# Patient Record
Sex: Male | Born: 1945
Health system: Southern US, Community
[De-identification: ages and names within clinical notes are randomized; demographics above are authoritative.]

## PROBLEM LIST (undated history)

## (undated) DIAGNOSIS — E785 Hyperlipidemia, unspecified: Secondary | ICD-10-CM

## (undated) DIAGNOSIS — R011 Cardiac murmur, unspecified: Secondary | ICD-10-CM

## (undated) DIAGNOSIS — M109 Gout, unspecified: Secondary | ICD-10-CM

## (undated) DIAGNOSIS — I10 Essential (primary) hypertension: Secondary | ICD-10-CM

## (undated) DIAGNOSIS — M1990 Unspecified osteoarthritis, unspecified site: Secondary | ICD-10-CM

## (undated) DIAGNOSIS — C801 Malignant (primary) neoplasm, unspecified: Secondary | ICD-10-CM

## (undated) DIAGNOSIS — C169 Malignant neoplasm of stomach, unspecified: Secondary | ICD-10-CM

## (undated) HISTORY — DX: Hyperlipidemia, unspecified: E78.5

## (undated) HISTORY — PX: FINGER SURGERY: SHX640

## (undated) HISTORY — DX: Malignant neoplasm of stomach, unspecified: C16.9

## (undated) HISTORY — PX: COLONOSCOPY: SHX174

## (undated) HISTORY — DX: Gout, unspecified: M10.9

## (undated) NOTE — *Deleted (*Deleted)
[facility]   Telephone:(336) [phone] Fax:(336) [phone]   Clinic Follow up Note   Patient Care Team: Gabriel Coop, FNP as PCP - General (Family Medicine) Gabriel Mood, MD as Consulting Physician (Hematology) Gabriel Samples, NP as Nurse Practitioner (Nurse Practitioner) Gabriel Lint, MD as Consulting Physician (General Surgery) Gabriel Hippo, MD as Consulting Physician (Gastroenterology) Gabriel Fee, MD as Attending Physician (Gastroenterology) Gabriel Poole, RD as Dietitian (Nutrition)  Date of Service:  03/15/2020  CHIEF COMPLAINT: F/u on gastric cancer and IDA  SUMMARY OF ONCOLOGIC HISTORY: Oncology History Overview Note  Cancer Staging 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 Microscopic Comment Several of the fragments are involved by moderately differentiated adenocarcinoma.   02/17/2017 Procedure   UPPER ENDOSCOPY  FINDINGS - Normal esophagus. - Z-line irregular, 44 cm from the incisors. - Red blood in the gastric body and in the gastric antrum. - Large gastric ulcer. Biopsied. - Erythematous mucosa in the antrum. - Normal cardia, gastric fundus, gastric body and pylorus. - Normal duodenal bulb and second portion of the duodenum. Comment: Endoscopic appearence concerning for malignant ulcer.   03/11/2017 Procedure   UPPER EUS PER DR. Christella Poole Findings: 1. The esophagus was normal. 2. There was a 2-3cm ulcerated, malignant mass along the greater curvature of the stomach, approximately  mid-body. The mass was non-circumferential. 3. The duodenum was normal. Endosonographic Finding 1. The gastric mass above correlated with a hypoechoic and heterogenous non-circumferential mass that measured 2.9cm across, 7mm deep. The endosonographic borders were poorly defined and there was clear sonographic evidence suggesting invasion into and through the muscularis propria layer without evident invasion into nearby organs (uT3). 2. The duodenal lymphnode described on recent CT scan appeared reactive by [place] criteria. 3. No perigastric adenopathy (uN0) 4. Limited views of the liver, spleen, pancreas, bile duct, gallbladder were all normal. - Along the greater curvature of the stomach, approximately mid-body, there is a 2.9cm uT3N0 (clinical stage IIB) gastric adenocarcinoma.   04/06/2017 Imaging   CT CHEST IMPRESSION: 1. No acute cardiopulmonary abnormalities. 2. Two small pulmonary nodules are noted measuring up to 5 mm. Nonspecific but warrant attention on follow-up imaging follow up 3. Nonspecific hyperdense, possibly enhancing lesion along the dome of liver is identified. In a patient that is at increased risk for more definitive assessment of this structure with contrast enhanced MRI of the liver is advised.   04/21/2017 Imaging   MR ABDOMEN IMPRESSION: 1. Enhancing 2.9 cm mass along the lesser curvature in the gastric antrum, compatible with known gastric malignancy . 2. Mildly enlarged gastrohepatic ligament lymph node, cannot exclude nodal metastasis. 3. No definite liver metastatic disease. Hyperenhancing 0.9 cm liver dome mass remains inconclusive, although the MRI features are most suggestive of a flash filling hemangioma, and the mass has been stable for nearly 2 months. Additional subcentimeter focus of hyperenhancement in the lateral segment left liver lobe is most likely a benign transient vascular phenomenon. Recommend attention to these lesions on a follow-up MRI  abdomen without and with IV contrast in  3-6 months. This recommendation follows ACR consensus guidelines: Management of Incidental Liver Lesions on CT: A White Paper of the ACR Incidental Findings Committee. J Am Coll Radiol 2017; 27:0350-0938. 4. Benign right adrenal adenoma.     04/22/2017 Pathology Results   Diagnosis 1. Liver, biopsy - BILE DUCT HAMARTOMA. - THERE IS NO EVIDENCE OF MALIGNANCY. 2. Lymph nodes, regional resection, portal - METASTATIC CARCINOMA IN 2 OF 6 LYMPH NODES (2/6). 3. Stomach, resection for tumor, distal - INVASIVE ADENOCARCINOMA, POORLY DIFFERENTIATED, SPANNING 3.8 CM. - PERINEURAL INVASION IS IDENTIFIED. - ADENOCARCINOMA INVOLVES THE SEROSA. - METASTATIC CARCINOMA IN 12 OF 30 LYMPH NODES (12/30), WITH EXTRACAPSULAR EXTENSION. - SEE ONCOLOGY TABLE BELOW. 4. Lymph node, biopsy, common hepatic artery - THERE IS NO EVIDENCE OF CARCINOMA IN 1 OF 1 LYMPH NODE (0/1). Microscopic Comment 3. STOMACH: Specimen: Stomach. Procedure: Partial gastrectomy. Tumor Site: Greater curvature. Tumor Size: 3.8 cm Histologic Type: Adenocarcinoma. Histologic Grade: G3: poorly differentiated. Microscopic Extent of Tumor: Adenocarcinoma involves the serosa. Margins (select all that apply): Adenoca Proximal Margin: Negative for adenocarcinoma. Distal Margin: Negative for adenocarcinoma. Treatment Effect: N/A Lymph-Vascular Invasion: Not identified. Perineural Invasion: Present. Additional findings: Chronic gastritis. Ancillary testing: Can be performed upon clinician request. 1 of 3 Duplicate copy FINAL for Gabriel Poole ([id number]) Microscopic Comment(continued) Lymph nodes: number examined - 37; number positive: 14 Pathologic Staging: pT4a, pN3a (JBK:gt, 04/27/17)   06/02/2017 - 09/30/2017 Adjuvant Chemotherapy   FOLFOX every 2 weeks, oxaliplatin stopped in March 2019 due to neuropathy, chemo stopped on 09/30/2017 per pt's request    10/29/2017 Imaging    10/29/2017 CT CAP  IMPRESSION: 1. Status post distal gastrectomy. No evidence for metastatic disease in the chest, abdomen, or pelvis. 2. Stable tiny right pulmonary nodules. Continued attention on follow-up recommended. 3. 9 mm hypervascular lesion in the dome of the liver is stable over multiple studies back to 04/05/2017. Continued attention on follow-up suggested. 4. Ascending thoracic aorta measures 4.2 cm diameter. Recommend annual imaging followup by CTA or MRA. This recommendation follows 2010 ACCF/AHA/AATS/ACR/ASA/SCA/SCAI/SIR/STS/SVM Guidelines for the Diagnosis and Management of Patients with Thoracic Aortic Disease. Circulation. 2010; 121: R678-L381 . 5. Marked prostatomegaly. 6.  Aortic Atherosclerois (ICD10-170.0)   05/09/2018 Imaging   05/09/2018 CT CAP IMPRESSION: 1. No definite findings to suggest metastatic disease to the chest, abdomen or pelvis. 2. Multiple small pulmonary nodules scattered throughout the lungs bilaterally measuring 5 mm or less in size, stable compared to prior studies from 2018, favored to be benign. Continued attention on future follow-up examinations is recommended. 3. Aortic atherosclerosis with ectasia of the ascending thoracic aorta (4 cm in diameter). Recommend annual imaging followup by CTA or MRA. This recommendation follows 2010 ACCF/AHA/AATS/ACR/ASA/SCA/SCAI/SIR/STS/SVM Guidelines for the Diagnosis and Management of Patients with Thoracic Aortic Disease. Circulation. 2010; 121: O175-Z025. 4. Multiple small calculi lying dependently in the right-side of the urinary bladder. 5. Severe prostatomegaly. 6. Additional incidental findings, as above.   04/04/2019 Imaging   CT AP IMPRESSION: 1. Redemonstrated postoperative findings of distal gastrectomy and gastrojejunostomy (series 2, image 18, series 4, image 27). No evidence of malignant recurrence or metastatic disease in the abdomen or pelvis. 2. Subcutaneous body fat is  substantially diminished in comparison to prior examination, in keeping with reported history of weight loss. 3. Gross prostatomegaly and urinary bladder wall thickening, likely due to chronic outlet obstruction. 4.  Aortic Atherosclerosis (ICD10-I70.0).   08/16/2019 Imaging   CT CAP w contrast IMPRESSION: 1. No acute findings within the chest, abdomen or pelvis.  No specific findings identified to suggest residual or recurrent tumor or metastatic disease. 2. Small nonspecific pulmonary nodules are unchanged. 3. Marked enlargement of the prostate gland. 4. Multiple tiny bladder stones. 5. Aortic atherosclerosis. Aortic Atherosclerosis (ICD10-I70.0).   11/02/2019 Procedure   EGD impression - Normal hypopharynx. - Normal esophagus. - Z-line regular, 45 cm from the incisors. - Patent Billroth II gastrojejunostomy was found. 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 Biopsied. - Gastric ulcer with raised margins appears to be separate lesion. Biopsied.  comment: recurrent tumor at anastamosis appears to be separate form fundal lesion. these lesions are the source of chronic blood loss.  Colonoscopy impression: - Perianal skin tags found on perianal exam. - The entire examined colon is normal. - External and internal hemorrhoids. - No specimens collected.   11/02/2019 Pathology Results   FINAL MICROSCOPIC DIAGNOSIS:  A. STOMACH, BIOPSY:  -  Poorly differentiated carcinoma  -  See comment  B. STOMACH, FUNDUS, BIOPSY:  -  Adenocarcinoma  -  See comment  COMMENT:  A and B.  The carcinoma in part A is more poorly differentiated than  what is seen in part B but both are favored to be adenocarcinoma.  Dr.  Rayetta Pigg reviewed the case and agrees with the above diagnosis. Dr.  Karilyn Cota was notified of these results on November 03, 2019.    12/05/2019 PET scan   IMPRESSION: Marked hypermetabolic activity within the thickened stomach in this patient with known gastric cancer with associated  metastatic disease to the liver and small lymph nodes in the area of the hepatic gastric recess that are suspicious based on location and size though not particularly FDG avid.   Small lymph nodes along the posterolateral margin of the aorta with mild increased FDG uptake (image 107) (SUVmax = 2.8 these are approximately 6-7 mm and there are several small nodes tracking beneath the LEFT retrocrural region. Attention on follow-up)   Increased FDG uptake in the central portion of the prostate likely within the prosthetic urethra given the marked hypermetabolic features in the central location. Would however consider correlation with PSA as clinically warranted. Prostate is markedly enlarged otherwise with heterogeneous FDG uptake.   Aortic Atherosclerosis (ICD10-I70.0).    12/07/2019 -  Chemotherapy   FOLFOX every 2 weeks starting 12/07/19. Add Transtuzumab (Kanjinti) q2 weeks and Keytruda q6weeks with C3 on 01/24/20.    12/14/2019 Pathology Results   FINAL MICROSCOPIC DIAGNOSIS:   A. LIVER, NEEDLE CORE BIOPSY:  - Poorly differentiated adenocarcinoma, see comment.   COMMENT:   The tumor has a similar appearance to the patient's recent gastric  biopsies ([id number]). There is sufficient material for additional  testing if requested.       CURRENT THERAPY:  FOLFOX every 2 weeks starting 12/07/19. Add Transtuzumab(Kanjinti) q2 weeks and Keytruda q6weeks with C3 on 01/24/20.  INTERVAL HISTORY: *** SYDNEY AZURE is here for a follow up. He presents to the clinic alone.    REVIEW OF SYSTEMS:  *** Constitutional: Denies fevers, chills or abnormal weight loss Eyes: Denies blurriness of vision Ears, nose, mouth, throat, and face: Denies mucositis or sore throat Respiratory: Denies cough, dyspnea or wheezes Cardiovascular: Denies palpitation, chest discomfort or lower extremity swelling Gastrointestinal:  Denies nausea, heartburn or change in bowel habits Skin: Denies abnormal  skin rashes Lymphatics: Denies new lymphadenopathy or easy bruising Neurological:Denies numbness, tingling or new weaknesses Behavioral/Psych: Poole is stable, no new changes  All other systems were reviewed with the patient and are negative.  MEDICAL HISTORY:  Past Medical History:  Diagnosis Date  . Arthritis   . Cancer [facility]) dx oct 02-2017   stomach  . Gout   . Heart murmur   . Hyperlipidemia   . Hypertension   . Stomach cancer [facility])     SURGICAL HISTORY: Past Surgical History:  Procedure Laterality Date  . BIOPSY  11/02/2019   Procedure: BIOPSY;  Surgeon: Gabriel Hippo, MD;  [facility];  Service: Endoscopy;;  gastric  . COLONOSCOPY    . COLONOSCOPY N/A 11/02/2019   Procedure: COLONOSCOPY;  Surgeon: Gabriel Hippo, MD;  [facility]: [facility];  Service: Endoscopy;  Laterality: N/A;  100  . ESOPHAGOGASTRODUODENOSCOPY N/A 02/17/2017   Procedure: ESOPHAGOGASTRODUODENOSCOPY (EGD);  Surgeon: Gabriel Hippo, MD;  [facility];  Service: Endoscopy;  Laterality: N/A;  3:00  . ESOPHAGOGASTRODUODENOSCOPY N/A 11/02/2019   Procedure: ESOPHAGOGASTRODUODENOSCOPY (EGD);  Surgeon: Gabriel Hippo, MD;  [facility];  Service: Endoscopy;  Laterality: N/A;  . EUS N/A 03/11/2017   Procedure: UPPER ENDOSCOPIC ULTRASOUND (EUS) LINEAR;  Surgeon: Gabriel Fee, MD;  Location: [facility];  Service: Endoscopy;  Laterality: N/A;  . EUS N/A 03/11/2017   Procedure: UPPER ENDOSCOPIC ULTRASOUND (EUS) RADIAL;  Surgeon: Gabriel Fee, MD;  Location: [facility];  Service: Endoscopy;  Laterality: N/A;  . FINGER SURGERY     Lt middle   . GASTRECTOMY N/A 04/22/2017   Procedure: PARTIAL GASTRECTOMY;  Surgeon: Gabriel Lint, MD;  Location: [facility] OR;  Service: General;  Laterality: N/A;  . GASTROJEJUNOSTOMY N/A 04/22/2017   Procedure: JEJUNAL FEEDING TUBE PLACEMENT;  Surgeon: Gabriel Lint, MD;  Location: [facility] OR;  Service: General;  Laterality: N/A;  . HYDROCELE  EXCISION  02/12/2011   Procedure: HYDROCELECTOMY ADULT;  Surgeon: Ky Barban;  Location: [facility] ORS;  Service: Urology;  Laterality: Left;  . LAPAROSCOPY N/A 04/22/2017   Procedure: LAPAROSCOPY DIAGNOSTIC ERAS PATHWAY;  Surgeon: Gabriel Lint, MD;  Location: [facility] OR;  Service: General;  Laterality: N/A;  EPIDURAL  . PORTACATH PLACEMENT N/A 05/28/2017   Procedure: INSERTION PORT-A-CATH ERAS PATHWAY;  Surgeon: Gabriel Lint, MD;  Location: [facility];  Service: General;  Laterality: N/A;    I have reviewed the social history and family history with the patient and they are unchanged from previous note.  ALLERGIES:  is allergic to penicillins.  MEDICATIONS:  Current Outpatient Medications  Medication Sig Dispense Refill  . amLODipine (NORVASC) 10 MG tablet Take 1 tablet (10 mg total) by mouth daily. 90 tablet 0  . atorvastatin (LIPITOR) 40 MG tablet Take 1 tablet (40 mg total) by mouth daily at 6 PM. 90 tablet 0  . Cholecalciferol (VITAMIN D3) 5000 units CAPS Take 5,000 Units by mouth daily.    . finasteride (PROSCAR) 5 MG tablet Take 5 mg by mouth daily.    Marland Kitchen HYDROcodone-acetaminophen (NORCO/VICODIN) 5-325 MG tablet TAKE 1 TABLET EVERY 6 HOURS AS NEEDED FOR MODERATE PAIN 60 tablet 0  . HYDROcodone-acetaminophen (NORCO/VICODIN) 5-325 MG tablet Take 1 tablet by mouth every 6 (six) hours as needed for moderate pain. 60 tablet 0  . lidocaine-prilocaine (EMLA) cream Apply to affected area once 30 g 3  . megestrol (MEGACE ES) 625 MG/5ML suspension Take 5 mLs (625 mg total) by mouth daily. 150 mL 0  . ondansetron (ZOFRAN) 8 MG tablet Take 1 tablet (8 mg total) by mouth 2 (two) times daily as needed for refractory nausea / vomiting. Start on day 3 after  chemotherapy. 30 tablet 1  . pantoprazole (PROTONIX) 40 MG tablet Take 1 tablet (40 mg total) by mouth daily. 30 tablet 2  . potassium chloride SA (K-DUR,KLOR-CON) 20 MEQ tablet Take 1 tablet (20 mEq total) by mouth 2 (two) times daily.  40 tablet 1  . prochlorperazine (COMPAZINE) 10 MG tablet Take 1 tablet (10 mg total) by mouth every 6 (six) hours as needed (Nausea or vomiting). 30 tablet 1  . tadalafil (CIALIS) 5 MG tablet Take 5 mg by mouth daily.    . tamsulosin (FLOMAX) 0.4 MG CAPS capsule Take 0.4 mg by mouth daily.    Marland Kitchen triamterene-hydrochlorothiazide (MAXZIDE-25) 37.5-25 MG tablet Take 1 tablet by mouth daily.     No current facility-administered medications for this visit.    PHYSICAL EXAMINATION: ECOG PERFORMANCE STATUS: {CHL ONC ECOG [phone]}  There were no vitals filed for this visit. There were no vitals filed for this visit. *** GENERAL:alert, no distress and comfortable SKIN: skin color, texture, turgor are normal, no rashes or significant lesions EYES: normal, Conjunctiva are pink and non-injected, sclera clear {OROPHARYNX:no exudate, no erythema and lips, buccal mucosa, and tongue normal}  NECK: supple, thyroid normal size, non-tender, without nodularity LYMPH:  no palpable lymphadenopathy in the cervical, axillary {or inguinal} LUNGS: clear to auscultation and percussion with normal breathing effort HEART: regular rate & rhythm and no murmurs and no lower extremity edema ABDOMEN:abdomen soft, non-tender and normal bowel sounds Musculoskeletal:no cyanosis of digits and no clubbing  NEURO: alert & oriented x 3 with fluent speech, no focal motor/sensory deficits  LABORATORY DATA:  I have reviewed the data as listed CBC Latest Ref Rng & Units 03/06/2020 02/21/2020 02/07/2020  WBC 4.0 - 10.5 K/uL 5.4 10.9(H) 5.7  Hemoglobin 13.0 - 17.0 g/dL 8.3(L) 9.7(L) 9.8(L)  Hematocrit 39 - 52 % 26.4(L) 30.5(L) 30.7(L)  Platelets 150 - 400 K/uL 317 242 252     CMP Latest Ref Rng & Units 03/06/2020 02/21/2020 02/07/2020  Glucose 70 - 99 mg/dL 82 79 78  BUN 8 - 23 mg/dL 14 72(Z) 17  Creatinine 0.61 - 1.24 mg/dL 3.66 4.40 3.47  Sodium 135 - 145 mmol/L 137 138 142  Potassium 3.5 - 5.1 mmol/L 3.8 3.7 3.1(L)   Chloride 98 - 111 mmol/L 107 108 113(H)  CO2 22 - 32 mmol/L 24 28 26   Calcium 8.9 - 10.3 mg/dL 9.2 4.2(V) 9.5(G)  Total Protein 6.5 - 8.1 g/dL 5.9(L) 5.2(L) 5.5(L)  Total Bilirubin 0.3 - 1.2 mg/dL 0.4 0.5 0.4  Alkaline Phos 38 - 126 U/L 82 80 73  AST 15 - 41 U/L 13(L) 12(L) 14(L)  ALT 0 - 44 U/L 8 6 10       RADIOGRAPHIC STUDIES: I have personally reviewed the radiological images as listed and agreed with the findings in the report. CT CHEST ABDOMEN PELVIS W CONTRAST  Result Date: 03/14/2020 CLINICAL DATA:  Restaging gastric cancer. Weight loss and loss of appetite. EXAM: CT CHEST, ABDOMEN, AND PELVIS WITH CONTRAST TECHNIQUE: Multidetector CT imaging of the chest, abdomen and pelvis was performed following the standard protocol during bolus administration of intravenous contrast. CONTRAST:  OMNIPAQUE IOHEXOL 300 MG/ML  SOLN COMPARISON:  PET-CT 12/05/2019 FINDINGS: CT CHEST FINDINGS Cardiovascular: Left Port-A-Cath tip: SVC. Aortic and branch vessel atherosclerotic vascular calcifications. Ascending thoracic aortic aneurysm 4.2 cm in diameter, stable. Mediastinum/Nodes: Unremarkable Lungs/Pleura: Bilateral airway thickening with airway plugging in both lower lobes and resulting airspace opacities due to atelectasis and possibly pneumonia in the lower lobes. Musculoskeletal:  A few small sclerotic right rib lesions are unchanged and not previously hypermetabolic, and considered benign. CT ABDOMEN PELVIS FINDINGS Hepatobiliary: 1.0 cm hypodense lesion in segment 4 of the liver corresponding to hypermetabolic lesion shown on prior PET-CT, but subjectively reduced in size compared to prior PET-CT. Stable 0.8 by 0.5 cm hypodense lesion in the lateral segment left hepatic lobe on image 57/2, no change from 04/04/2019. Borderline wall thickening in the gallbladder. Pancreas: Unremarkable Spleen: Unremarkable Adrenals/Urinary Tract: Both adrenal glands appear normal. Bilateral renal cysts are present.  Nondistended urinary bladder. Stomach/Bowel: Partial gastrectomy. There is previously very high activity in the gastric wall. There are some regions of indistinctness and thickening of the gastric wall and residual tumor is not excluded for example on images 60-62 of series 2. There are dilated loops of small bowel anteriorly measuring up to 4.2 cm in diameter, without a well-defined lead point for obstruction. Cannot exclude wall thickening in the nondistended ascending colon and hepatic flexure. Vascular/Lymphatic: Aortoiliac atherosclerotic vascular disease. 1 no well-defined adenopathy. Reproductive: Marked prostatomegaly. Other: Diffuse low-grade subcutaneous and mesenteric edema. Trace ascites in the pelvis. Musculoskeletal: Unremarkable IMPRESSION: 1. The small hypodense lesion in segment 4 of the liver was previously hypermetabolic, and is subjectively reduced in size compared to prior PET-CT. 2. Dilated loops of small bowel anteriorly without a well-defined lead point for obstruction. This could be from ileus or low-grade partial small bowel obstruction. 3. Continued gastric wall thickening is not entirely specific but residual gastric tumor is not excluded. Postoperative findings along the residual stomach. 4. Cannot exclude wall thickening in the nondistended ascending colon and hepatic flexure. 5. Diffuse low-grade subcutaneous and mesenteric edema. Trace ascites in the pelvis. Appearance favors third spacing of fluid. 6. Bilateral airway thickening with airway plugging in both lower lobes and resulting airspace opacities due to atelectasis and possibly pneumonia in the lower lobes. 7. Marked prostatomegaly. 8. Stable 4.2 cm ascending thoracic aortic aneurysm. 9. Aortic atherosclerosis. Aortic Atherosclerosis (ICD10-I70.0). Electronically Signed   By: Gaylyn Rong M.D.   On: 03/14/2020 17:55     ASSESSMENT & PLAN:  Gabriel Poole is a 55 y.o. male with    1.Primary adenocarcinoma of the  pyloric antrum, pT4a, PN3aM0, stage IIIB, Grade3, MSI-stable, recurrence at the anastomosis and newadenocarcinomain the gastric fundus in 10/2019, oligo liver metastasis 11/2019, HER2(+), MSS -Initially diagnosed in 02/2017.S/psurgical resection and adjuvant chemotherapyFOLFOX. Patient was not compliant with chemo, oxaliplatin stopped after 4 cycles, and 5-FU stopped after 8 cycles treatment per his request. -He unfortunately developed local gastric recurrence in July 2021 and liver metastasis in 12/2019 -His tumor is negative for PD-l1 and EBV, MMR normal, HER2(+), so he is a candidate for trastuzumab.Based on the [id number] data and recent FDA approval, he would also benefit from [facility]  -He started first linechemo FOLFOX q2weeks on 12/07/19.I addedTrastuzumab (Kanjinti)q2weeks and Keytruda q6weekswith C3 on 01/24/20.Goal of care is to control his disease and prolong his life. -S/p C5 he has mild neuropathy in his hands with decreased sensation.  -Labs reviewed and adequate to proceed with C6 FOLFOX and Kanjinti today with oxaliplatin dose reduction -Continue Keytruda every 6 weeks. Proceed today (03/06/20)  -Plan to repeat scan in 2 weeks before next visit. F/u in 2 weeks    2.Abdominal pain, nausea and vomiting and weight loss  -Secondary to metastatic gastric cancer, and chemotherapy -His recent abdominal pain had resolved from C4-5 but recently recurred (8/10). He is taking hydrocodone. -For any constipation he can use miralax.  -  I reviewed antiemetic use with him. Continue Zofran once in the morning and as needed,along with Compazine through the day. Overall he needs antiemetics at least 3 times a day to control his nausea.  -I encouraged him to increase his carnation breakfast to twice daily and continue Megace and Protonix once daily.  -Continue to f/u with Dietician   3. Recurrent iron deficiency anemia, secondary to cancer recurrence -He initially had low serum iron and  low transferrin saturation in 06/2017 after cancer resection and again with cancer recurrence due to iron deficiency.  -He has required blood transfusion on 08/03/19 and 10/25/19. He has received IV Feraheme as needed since 08/08/19. Frequency slowed down on chemotherapy.  -Has been mild and stable but today reduced to 8.3 (03/06/20). He denies obvious bleeding. If worsens, may give blood transfusion  4. Ascendingaortic aneurysm. Will monitor onfuture scans  5. Smokingcessation -Hesmokes less now, 1 cigarette a day occasionally.  6. BPH and elevated PSA -He has nocturia and urinary frequency, history of BPH, and elevated PSA in July 2019 -He was previously seen by urologist before and had prostate shaved.  -PriorCT scan findings showed severe prostatomegaly. PET from 12/05/19 showed marked hypermetabolic uptake in this area. I will repeat PSA, will hold on biopsy for now due to his metastatic gastric cancer.   7. Neuropathy, G1  -Secondary to initial FOLFOX -S/p C5 of restarted FOLFOX he has progressed neuropathy with numbness in hands. No symptoms involved in his feet. I reduced Oxaliplatin dose further with C6.   PLAN:  -I refilled Norco, Zofran, Protonix today  -Labs reviewed and adequate to proceed with C6 FOLFOX and Kanjinti and Keytruda today with oxaliplatin dose reduction -we reviewed antiemetics and symptom management  -Lab, flush, f/u and FOLFOX and Kanjinti in 2 and 4 weeks.  -Continue Keytruda every 6 weeks.   -restaging CT CAP w contrast in 2 weeks before next visit   No problem-specific Assessment & Plan notes found for this encounter.   No orders of the defined types were placed in this encounter.  All questions were answered. The patient knows to call the clinic with any problems, questions or concerns. No barriers to learning was detected. The total time spent in the appointment was {CHL ONC TIME VISIT - GNFAO:[id number]}.     Delphina Cahill 03/15/2020    Rogelia Rohrer, am acting as scribe for Gabriel Mood, MD.   {Add scribe attestation statement}

## (undated) NOTE — *Deleted (*Deleted)
[facility]   Telephone:(336) [phone] Fax:(336) [phone]   Clinic Follow up Note   Patient Care Team: Jason Coop, FNP as PCP - General (Family Medicine) Malachy Mood, MD as Consulting Physician (Hematology) Pollyann Samples, NP as Nurse Practitioner (Nurse Practitioner) Almond Lint, MD as Consulting Physician (General Surgery) Malissa Hippo, MD as Consulting Physician (Gastroenterology) Rachael Fee, MD as Attending Physician (Gastroenterology) Anabel Bene, RD as Dietitian (Nutrition)  Date of Service:  02/23/2020  CHIEF COMPLAINT: F/u on gastric cancer and IDA  SUMMARY OF ONCOLOGIC HISTORY: Oncology History Overview Note  Cancer Staging 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 Microscopic Comment Several of the fragments are involved by moderately differentiated adenocarcinoma.   02/17/2017 Procedure   UPPER ENDOSCOPY  FINDINGS - Normal esophagus. - Z-line irregular, 44 cm from the incisors. - Red blood in the gastric body and in the gastric antrum. - Large gastric ulcer. Biopsied. - Erythematous mucosa in the antrum. - Normal cardia, gastric fundus, gastric body and pylorus. - Normal duodenal bulb and second portion of the duodenum. Comment: Endoscopic appearence concerning for malignant ulcer.   03/11/2017 Procedure   UPPER EUS PER DR. Christella Hartigan Findings: 1. The esophagus was normal. 2. There was a 2-3cm ulcerated, malignant mass along the greater curvature of the stomach, approximately  mid-body. The mass was non-circumferential. 3. The duodenum was normal. Endosonographic Finding 1. The gastric mass above correlated with a hypoechoic and heterogenous non-circumferential mass that measured 2.9cm across, 7mm deep. The endosonographic borders were poorly defined and there was clear sonographic evidence suggesting invasion into and through the muscularis propria layer without evident invasion into nearby organs (uT3). 2. The duodenal lymphnode described on recent CT scan appeared reactive by [place] criteria. 3. No perigastric adenopathy (uN0) 4. Limited views of the liver, spleen, pancreas, bile duct, gallbladder were all normal. - Along the greater curvature of the stomach, approximately mid-body, there is a 2.9cm uT3N0 (clinical stage IIB) gastric adenocarcinoma.   04/06/2017 Imaging   CT CHEST IMPRESSION: 1. No acute cardiopulmonary abnormalities. 2. Two small pulmonary nodules are noted measuring up to 5 mm. Nonspecific but warrant attention on follow-up imaging follow up 3. Nonspecific hyperdense, possibly enhancing lesion along the dome of liver is identified. In a patient that is at increased risk for more definitive assessment of this structure with contrast enhanced MRI of the liver is advised.   04/21/2017 Imaging   MR ABDOMEN IMPRESSION: 1. Enhancing 2.9 cm mass along the lesser curvature in the gastric antrum, compatible with known gastric malignancy . 2. Mildly enlarged gastrohepatic ligament lymph node, cannot exclude nodal metastasis. 3. No definite liver metastatic disease. Hyperenhancing 0.9 cm liver dome mass remains inconclusive, although the MRI features are most suggestive of a flash filling hemangioma, and the mass has been stable for nearly 2 months. Additional subcentimeter focus of hyperenhancement in the lateral segment left liver lobe is most likely a benign transient vascular phenomenon. Recommend attention to these lesions on a follow-up MRI  abdomen without and with IV contrast in  3-6 months. This recommendation follows ACR consensus guidelines: Management of Incidental Liver Lesions on CT: A White Paper of the ACR Incidental Findings Committee. J Am Coll Radiol 2017; 27:0350-0938. 4. Benign right adrenal adenoma.     04/22/2017 Pathology Results   Diagnosis 1. Liver, biopsy - BILE DUCT HAMARTOMA. - THERE IS NO EVIDENCE OF MALIGNANCY. 2. Lymph nodes, regional resection, portal - METASTATIC CARCINOMA IN 2 OF 6 LYMPH NODES (2/6). 3. Stomach, resection for tumor, distal - INVASIVE ADENOCARCINOMA, POORLY DIFFERENTIATED, SPANNING 3.8 CM. - PERINEURAL INVASION IS IDENTIFIED. - ADENOCARCINOMA INVOLVES THE SEROSA. - METASTATIC CARCINOMA IN 12 OF 30 LYMPH NODES (12/30), WITH EXTRACAPSULAR EXTENSION. - SEE ONCOLOGY TABLE BELOW. 4. Lymph node, biopsy, common hepatic artery - THERE IS NO EVIDENCE OF CARCINOMA IN 1 OF 1 LYMPH NODE (0/1). Microscopic Comment 3. STOMACH: Specimen: Stomach. Procedure: Partial gastrectomy. Tumor Site: Greater curvature. Tumor Size: 3.8 cm Histologic Type: Adenocarcinoma. Histologic Grade: G3: poorly differentiated. Microscopic Extent of Tumor: Adenocarcinoma involves the serosa. Margins (select all that apply): Adenoca Proximal Margin: Negative for adenocarcinoma. Distal Margin: Negative for adenocarcinoma. Treatment Effect: N/A Lymph-Vascular Invasion: Not identified. Perineural Invasion: Present. Additional findings: Chronic gastritis. Ancillary testing: Can be performed upon clinician request. 1 of 3 Duplicate copy FINAL for Gabriel Poole, Gabriel Poole ([id number]) Microscopic Comment(continued) Lymph nodes: number examined - 37; number positive: 14 Pathologic Staging: pT4a, pN3a (JBK:gt, 04/27/17)   06/02/2017 - 09/30/2017 Adjuvant Chemotherapy   FOLFOX every 2 weeks, oxaliplatin stopped in March 2019 due to neuropathy, chemo stopped on 09/30/2017 per pt's request    10/29/2017 Imaging    10/29/2017 CT CAP  IMPRESSION: 1. Status post distal gastrectomy. No evidence for metastatic disease in the chest, abdomen, or pelvis. 2. Stable tiny right pulmonary nodules. Continued attention on follow-up recommended. 3. 9 mm hypervascular lesion in the dome of the liver is stable over multiple studies back to 04/05/2017. Continued attention on follow-up suggested. 4. Ascending thoracic aorta measures 4.2 cm diameter. Recommend annual imaging followup by CTA or MRA. This recommendation follows 2010 ACCF/AHA/AATS/ACR/ASA/SCA/SCAI/SIR/STS/SVM Guidelines for the Diagnosis and Management of Patients with Thoracic Aortic Disease. Circulation. 2010; 121: R678-L381 . 5. Marked prostatomegaly. 6.  Aortic Atherosclerois (ICD10-170.0)   05/09/2018 Imaging   05/09/2018 CT CAP IMPRESSION: 1. No definite findings to suggest metastatic disease to the chest, abdomen or pelvis. 2. Multiple small pulmonary nodules scattered throughout the lungs bilaterally measuring 5 mm or less in size, stable compared to prior studies from 2018, favored to be benign. Continued attention on future follow-up examinations is recommended. 3. Aortic atherosclerosis with ectasia of the ascending thoracic aorta (4 cm in diameter). Recommend annual imaging followup by CTA or MRA. This recommendation follows 2010 ACCF/AHA/AATS/ACR/ASA/SCA/SCAI/SIR/STS/SVM Guidelines for the Diagnosis and Management of Patients with Thoracic Aortic Disease. Circulation. 2010; 121: O175-Z025. 4. Multiple small calculi lying dependently in the right-side of the urinary bladder. 5. Severe prostatomegaly. 6. Additional incidental findings, as above.   04/04/2019 Imaging   CT AP IMPRESSION: 1. Redemonstrated postoperative findings of distal gastrectomy and gastrojejunostomy (series 2, image 18, series 4, image 27). No evidence of malignant recurrence or metastatic disease in the abdomen or pelvis. 2. Subcutaneous body fat is  substantially diminished in comparison to prior examination, in keeping with reported history of weight loss. 3. Gross prostatomegaly and urinary bladder wall thickening, likely due to chronic outlet obstruction. 4.  Aortic Atherosclerosis (ICD10-I70.0).   08/16/2019 Imaging   CT CAP w contrast IMPRESSION: 1. No acute findings within the chest, abdomen or pelvis.  No specific findings identified to suggest residual or recurrent tumor or metastatic disease. 2. Small nonspecific pulmonary nodules are unchanged. 3. Marked enlargement of the prostate gland. 4. Multiple tiny bladder stones. 5. Aortic atherosclerosis. Aortic Atherosclerosis (ICD10-I70.0).   11/02/2019 Procedure   EGD impression - Normal hypopharynx. - Normal esophagus. - Z-line regular, 45 cm from the incisors. - Patent Billroth II gastrojejunostomy was found. 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 Biopsied. - Gastric ulcer with raised margins appears to be separate lesion. Biopsied.  comment: recurrent tumor at anastamosis appears to be separate form fundal lesion. these lesions are the source of chronic blood loss.  Colonoscopy impression: - Perianal skin tags found on perianal exam. - The entire examined colon is normal. - External and internal hemorrhoids. - No specimens collected.   11/02/2019 Pathology Results   FINAL MICROSCOPIC DIAGNOSIS:  A. STOMACH, BIOPSY:  -  Poorly differentiated carcinoma  -  See comment  B. STOMACH, FUNDUS, BIOPSY:  -  Adenocarcinoma  -  See comment  COMMENT:  A and B.  The carcinoma in part A is more poorly differentiated than  what is seen in part B but both are favored to be adenocarcinoma.  Dr.  Rayetta Pigg reviewed the case and agrees with the above diagnosis. Dr.  Karilyn Cota was notified of these results on November 03, 2019.    12/05/2019 PET scan   IMPRESSION: Marked hypermetabolic activity within the thickened stomach in this patient with known gastric cancer with associated  metastatic disease to the liver and small lymph nodes in the area of the hepatic gastric recess that are suspicious based on location and size though not particularly FDG avid.   Small lymph nodes along the posterolateral margin of the aorta with mild increased FDG uptake (image 107) (SUVmax = 2.8 these are approximately 6-7 mm and there are several small nodes tracking beneath the LEFT retrocrural region. Attention on follow-up)   Increased FDG uptake in the central portion of the prostate likely within the prosthetic urethra given the marked hypermetabolic features in the central location. Would however consider correlation with PSA as clinically warranted. Prostate is markedly enlarged otherwise with heterogeneous FDG uptake.   Aortic Atherosclerosis (ICD10-I70.0).    12/07/2019 -  Chemotherapy   FOLFOX every 2 weeks starting 12/07/19. Add Transtuzumab (Kanjinti) q2 weeks and Keytruda q6weeks with C3 on 01/24/20.    12/14/2019 Pathology Results   FINAL MICROSCOPIC DIAGNOSIS:   A. LIVER, NEEDLE CORE BIOPSY:  - Poorly differentiated adenocarcinoma, see comment.   COMMENT:   The tumor has a similar appearance to the patient's recent gastric  biopsies ([id number]). There is sufficient material for additional  testing if requested.       CURRENT THERAPY:  FOLFOX every 2 weeks starting 12/07/19. Add Transtuzumab(Kanjinti) q2 weeks and Keytruda q6weeks with C3 on 01/24/20.  INTERVAL HISTORY: *** Gabriel Poole is here for a follow up. He presents to the clinic alone.    REVIEW OF SYSTEMS:  *** Constitutional: Denies fevers, chills or abnormal weight loss Eyes: Denies blurriness of vision Ears, nose, mouth, throat, and face: Denies mucositis or sore throat Respiratory: Denies cough, dyspnea or wheezes Cardiovascular: Denies palpitation, chest discomfort or lower extremity swelling Gastrointestinal:  Denies nausea, heartburn or change in bowel habits Skin: Denies abnormal  skin rashes Lymphatics: Denies new lymphadenopathy or easy bruising Neurological:Denies numbness, tingling or new weaknesses Behavioral/Psych: Mood is stable, no new changes  All other systems were reviewed with the patient and are negative.  MEDICAL HISTORY:  Past Medical History:  Diagnosis Date  . Arthritis   . Cancer [facility]) dx oct 02-2017   stomach  . Gout   . Heart murmur   . Hyperlipidemia   . Hypertension   . Stomach cancer [facility])     SURGICAL HISTORY: Past Surgical History:  Procedure Laterality Date  . BIOPSY  11/02/2019   Procedure: BIOPSY;  Surgeon: Malissa Hippo, MD;  [facility];  Service: Endoscopy;;  gastric  . COLONOSCOPY    . COLONOSCOPY N/A 11/02/2019   Procedure: COLONOSCOPY;  Surgeon: Malissa Hippo, MD;  [facility];  Service: Endoscopy;  Laterality: N/A;  100  . ESOPHAGOGASTRODUODENOSCOPY N/A 02/17/2017   Procedure: ESOPHAGOGASTRODUODENOSCOPY (EGD);  Surgeon: Malissa Hippo, MD;  [facility];  Service: Endoscopy;  Laterality: N/A;  3:00  . ESOPHAGOGASTRODUODENOSCOPY N/A 11/02/2019   Procedure: ESOPHAGOGASTRODUODENOSCOPY (EGD);  Surgeon: Malissa Hippo, MD;  [facility];  Service: Endoscopy;  Laterality: N/A;  . EUS N/A 03/11/2017   Procedure: UPPER ENDOSCOPIC ULTRASOUND (EUS) LINEAR;  Surgeon: Rachael Fee, MD;  Location: [facility];  Service: Endoscopy;  Laterality: N/A;  . EUS N/A 03/11/2017   Procedure: UPPER ENDOSCOPIC ULTRASOUND (EUS) RADIAL;  Surgeon: Rachael Fee, MD;  Location: [facility];  Service: Endoscopy;  Laterality: N/A;  . FINGER SURGERY     Lt middle   . GASTRECTOMY N/A 04/22/2017   Procedure: PARTIAL GASTRECTOMY;  Surgeon: Almond Lint, MD;  Location: [facility] OR;  Service: General;  Laterality: N/A;  . GASTROJEJUNOSTOMY N/A 04/22/2017   Procedure: JEJUNAL FEEDING TUBE PLACEMENT;  Surgeon: Almond Lint, MD;  Location: [facility] OR;  Service: General;  Laterality: N/A;  . HYDROCELE  EXCISION  02/12/2011   Procedure: HYDROCELECTOMY ADULT;  Surgeon: Ky Barban;  Location: [facility];  Service: Urology;  Laterality: Left;  . LAPAROSCOPY N/A 04/22/2017   Procedure: LAPAROSCOPY DIAGNOSTIC ERAS PATHWAY;  Surgeon: Almond Lint, MD;  Location: [facility] OR;  Service: General;  Laterality: N/A;  EPIDURAL  . PORTACATH PLACEMENT N/A 05/28/2017   Procedure: INSERTION PORT-A-CATH ERAS PATHWAY;  Surgeon: Almond Lint, MD;  Location: [facility];  Service: General;  Laterality: N/A;    I have reviewed the social history and family history with the patient and they are unchanged from previous note.  ALLERGIES:  is allergic to penicillins.  MEDICATIONS:  Current Outpatient Medications  Medication Sig Dispense Refill  . amLODipine (NORVASC) 10 MG tablet Take 1 tablet (10 mg total) by mouth daily. 90 tablet 0  . atorvastatin (LIPITOR) 40 MG tablet Take 1 tablet (40 mg total) by mouth daily at 6 PM. 90 tablet 0  . Cholecalciferol (VITAMIN D3) 5000 units CAPS Take 5,000 Units by mouth daily.    . finasteride (PROSCAR) 5 MG tablet Take 5 mg by mouth daily.    Marland Kitchen HYDROcodone-acetaminophen (NORCO/VICODIN) 5-325 MG tablet Take 1 tablet by mouth every 6 (six) hours as needed for moderate pain. 60 tablet 0  . lidocaine-prilocaine (EMLA) cream Apply to affected area once 30 g 3  . megestrol (MEGACE ES) 625 MG/5ML suspension Take 5 mLs (625 mg total) by mouth daily. 150 mL 0  . ondansetron (ZOFRAN) 8 MG tablet Take 1 tablet (8 mg total) by mouth 2 (two) times daily as needed for refractory nausea / vomiting. Start on day 3 after chemotherapy. 30 tablet 1  . pantoprazole (PROTONIX) 40 MG tablet Take 1 tablet (40 mg total) by mouth daily. 30  tablet 2  . potassium chloride SA (K-DUR,KLOR-CON) 20 MEQ tablet Take 1 tablet (20 mEq total) by mouth 2 (two) times daily. 40 tablet 1  . prochlorperazine (COMPAZINE) 10 MG tablet Take 1 tablet (10 mg total) by mouth every 6 (six) hours as needed  (Nausea or vomiting). 30 tablet 1  . tadalafil (CIALIS) 5 MG tablet Take 5 mg by mouth daily.    . tamsulosin (FLOMAX) 0.4 MG CAPS capsule Take 0.4 mg by mouth daily.    Marland Kitchen triamterene-hydrochlorothiazide (MAXZIDE-25) 37.5-25 MG tablet Take 1 tablet by mouth daily.     No current facility-administered medications for this visit.   Facility-Administered Medications Ordered in Other Visits  Medication Dose Route Frequency Provider Last Rate Last Admin  . sodium chloride flush (NS) 0.9 % injection 10 mL  10 mL Intracatheter PRN Malachy Mood, MD   10 mL at 02/23/20 1337    PHYSICAL EXAMINATION: ECOG PERFORMANCE STATUS: {CHL ONC ECOG PS:[id number]}  There were no vitals filed for this visit. There were no vitals filed for this visit. *** GENERAL:alert, no distress and comfortable SKIN: skin color, texture, turgor are normal, no rashes or significant lesions EYES: normal, Conjunctiva are pink and non-injected, sclera clear {OROPHARYNX:no exudate, no erythema and lips, buccal mucosa, and tongue normal}  NECK: supple, thyroid normal size, non-tender, without nodularity LYMPH:  no palpable lymphadenopathy in the cervical, axillary {or inguinal} LUNGS: clear to auscultation and percussion with normal breathing effort HEART: regular rate & rhythm and no murmurs and no lower extremity edema ABDOMEN:abdomen soft, non-tender and normal bowel sounds Musculoskeletal:no cyanosis of digits and no clubbing  NEURO: alert & oriented x 3 with fluent speech, no focal motor/sensory deficits  LABORATORY DATA:  I have reviewed the data as listed CBC Latest Ref Rng & Units 02/21/2020 02/07/2020 01/24/2020  WBC 4.0 - 10.5 K/uL 10.9(H) 5.7 8.4  Hemoglobin 13.0 - 17.0 g/dL 1.6(X) 0.9(U) 10.4(L)  Hematocrit 39 - 52 % 30.5(L) 30.7(L) 32.7(L)  Platelets 150 - 400 K/uL 242 252 244     CMP Latest Ref Rng & Units 02/21/2020 02/07/2020 01/24/2020  Glucose 70 - 99 mg/dL 79 78 79  BUN 8 - 23 mg/dL 04(V) 17 16   Creatinine 0.61 - 1.24 mg/dL 4.09 8.11 9.14  Sodium 135 - 145 mmol/L 138 142 140  Potassium 3.5 - 5.1 mmol/L 3.7 3.1(L) 3.5  Chloride 98 - 111 mmol/L 108 113(H) 109  CO2 22 - 32 mmol/L 28 26 26   Calcium 8.9 - 10.3 mg/dL 7.8(G) 9.5(A) 9.0  Total Protein 6.5 - 8.1 g/dL 5.2(L) 5.5(L) 6.0(L)  Total Bilirubin 0.3 - 1.2 mg/dL 0.5 0.4 0.5  Alkaline Phos 38 - 126 U/L 80 73 88  AST 15 - 41 U/L 12(L) 14(L) 14(L)  ALT 0 - 44 U/L 6 10 7       RADIOGRAPHIC STUDIES: I have personally reviewed the radiological images as listed and agreed with the findings in the report. No results found.   ASSESSMENT & PLAN:  Gabriel Poole is a 47 y.o. male with    1.Primary adenocarcinoma of the pyloric antrum, pT4a, PN3aM0, stage IIIB, Grade3, MSI-stable, recurrence at the anastomosis and newadenocarcinomain the gastric fundus in 10/2019, oligo liver metastasis 11/2019, HER2(+), MSS -Initially diagnosed in 02/2017.S/psurgical resection and adjuvant chemotherapyFOLFOX. Patient was not compliant with chemo, oxaliplatin stopped after 4 cycles, and 5-FU stopped after 8 cycles treatment per his request. -He unfortunately developed local gastric recurrence in July 2021 and liver metastasis in 12/2019 -His tumor  is negative for PD-l1 and EBV, MMR normal, HER2(+), so he is a candidate for trastuzumab. -He started first linechemo FOLFOX q2weeks on 12/07/19.I addedTrastuzumab (Kanjinti)q2weeks and Keytruda q6weekswith C3 on 01/24/20.Goal of care is to control his disease and prolong his life. -S/p C5 ***  -Continue Keytruda every 6 weeks.  -Plan to repeat scan end of 02/2020 -F/u next week for toxicity check with IV Fluids.    2.Abdominal pain, nausea and vomiting and weight loss  -Secondary to metastatic gastric cancer, and chemotherapy -His recent abdominal pain has currently resolved between C4. He still has hydrocodone if needed.  -For any constipation he can use miralax.  -Continue Zofran once  in the morning and as needed,along with Compazine through the day. Overall he needs antiemetics at least 3 times a day to control his nausea.  -I encouraged him to increase his carnation breakfast to twice daily and continue Megace and Protonix once daily.  -Continue to f/u with Dietician   3. 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  -He has required blood transfusion on 08/03/19 and 10/25/19. He has received IV Feraheme as needed since 08/08/19. Frequency slowed down on chemotherapy.  -Currently mild and stable anemia on chemo.   4. Ascendingaortic aneurysm. Will monitor onfuture scans  5. Smokingcessation -Hesmokes less now, 1 cigarette a day occasionally.  6. BPH and elevated PSA -He has nocturia and urinary frequency, history of BPH, and elevated PSA in July 2019 -He was previously seen by urologist before and had prostate shaved.  -PriorCT scan findings showed severe prostatomegaly. PET from 12/05/19 showed marked hypermetabolic uptake in this area. I will repeat PSA, will hold on biopsy for now due to his metastatic gastric cancer.   7. 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 This is currently manageable with adequate functioning.  -No progressive neuropathyso far on FOLFOX, but does have increased cold sensitivity. Will continue to monitor.    PLAN:  -Labs reviewed and adequate to proceed with C6 FOLFOX and Kanjinti and Keytruda with dose reduced Oxaliplatin to 60 mg/m due to poor tolerance -Lab, flush, f/u and IV Fluids next week for toxicity checkup -Lab, flush, f/u and FOLFOX and Kanjinti in 2 and 3 weeks  -Restaging CT scan after cycle 6     No problem-specific Assessment & Plan notes found  for this encounter.   No orders of the defined types were placed in this encounter.  All questions were answered. The patient knows to call the clinic with any problems, questions or concerns. No barriers to learning was detected. The total time spent in the appointment was {CHL ONC TIME VISIT - EXBMW:[id number]}.     Delphina Cahill 02/23/2020   Rogelia Rohrer, am acting as scribe for Malachy Mood, MD.   {Add scribe attestation statement}

---

## 2004-07-04 ENCOUNTER — Ambulatory Visit: Payer: Self-pay | Admitting: Family Medicine

## 2005-02-17 ENCOUNTER — Ambulatory Visit: Payer: Self-pay | Admitting: Family Medicine

## 2005-04-08 ENCOUNTER — Ambulatory Visit: Payer: Self-pay | Admitting: Family Medicine

## 2005-05-15 ENCOUNTER — Ambulatory Visit: Payer: Self-pay | Admitting: Family Medicine

## 2005-11-03 ENCOUNTER — Ambulatory Visit: Payer: Self-pay | Admitting: Family Medicine

## 2006-03-04 ENCOUNTER — Ambulatory Visit: Payer: Self-pay | Admitting: Family Medicine

## 2006-03-08 ENCOUNTER — Ambulatory Visit: Payer: Self-pay | Admitting: Family Medicine

## 2010-12-17 ENCOUNTER — Emergency Department (HOSPITAL_COMMUNITY): Payer: 59

## 2010-12-17 ENCOUNTER — Observation Stay (HOSPITAL_COMMUNITY)
Admission: EM | Admit: 2010-12-17 | Discharge: 2010-12-18 | Disposition: A | Discharge status: Home / Self Care | Payer: 59 | Attending: Internal Medicine | Admitting: Internal Medicine

## 2010-12-17 ENCOUNTER — Other Ambulatory Visit: Payer: Self-pay

## 2010-12-17 DIAGNOSIS — N433 Hydrocele, unspecified: Secondary | ICD-10-CM | POA: Insufficient documentation

## 2010-12-17 DIAGNOSIS — Z598 Other problems related to housing and economic circumstances: Secondary | ICD-10-CM | POA: Insufficient documentation

## 2010-12-17 DIAGNOSIS — I160 Hypertensive urgency: Secondary | ICD-10-CM | POA: Diagnosis present

## 2010-12-17 DIAGNOSIS — Z720 Tobacco use: Secondary | ICD-10-CM | POA: Diagnosis present

## 2010-12-17 DIAGNOSIS — Z5987 Material hardship due to limited financial resources, not elsewhere classified: Secondary | ICD-10-CM | POA: Insufficient documentation

## 2010-12-17 DIAGNOSIS — E876 Hypokalemia: Secondary | ICD-10-CM | POA: Insufficient documentation

## 2010-12-17 DIAGNOSIS — Z91199 Patient's noncompliance with other medical treatment and regimen due to unspecified reason: Secondary | ICD-10-CM | POA: Insufficient documentation

## 2010-12-17 DIAGNOSIS — R9431 Abnormal electrocardiogram [ECG] [EKG]: Secondary | ICD-10-CM | POA: Insufficient documentation

## 2010-12-17 DIAGNOSIS — F17200 Nicotine dependence, unspecified, uncomplicated: Secondary | ICD-10-CM | POA: Insufficient documentation

## 2010-12-17 DIAGNOSIS — I10 Essential (primary) hypertension: Principal | ICD-10-CM | POA: Insufficient documentation

## 2010-12-17 DIAGNOSIS — Z9119 Patient's noncompliance with other medical treatment and regimen: Secondary | ICD-10-CM | POA: Insufficient documentation

## 2010-12-17 HISTORY — DX: Essential (primary) hypertension: I10

## 2010-12-17 HISTORY — DX: Unspecified osteoarthritis, unspecified site: M19.90

## 2010-12-17 LAB — CARDIAC PANEL(CRET KIN+CKTOT+MB+TROPI): Total CK: 226 U/L (ref 7–232)

## 2010-12-17 LAB — BASIC METABOLIC PANEL
Calcium: 10 mg/dL (ref 8.4–10.5)
GFR calc non Af Amer: >60 mL/min (ref >60)
Glucose, Bld: 99 mg/dL (ref 70–99)
Sodium: 140 mEq/L (ref 135–145)

## 2010-12-17 LAB — HEPATIC FUNCTION PANEL
Bilirubin, Direct: 0.1 mg/dL (ref 0.0–0.3)
Indirect Bilirubin: 0.2 mg/dL — ABNORMAL LOW (ref 0.3–0.9)

## 2010-12-17 LAB — URINALYSIS, ROUTINE W REFLEX MICROSCOPIC
Hgb urine dipstick: NEGATIVE
Ketones, ur: NEGATIVE mg/dL
Protein, ur: NEGATIVE mg/dL
Urobilinogen, UA: 0.2 mg/dL (ref 0.0–1.0)

## 2010-12-17 LAB — CBC
MCH: 29.8 pg (ref 26.0–34.0)
MCV: 89.7 fL (ref 78.0–100.0)
Platelets: 229 10*3/uL (ref 150–400)
RDW: 12.9 % (ref 11.5–15.5)

## 2010-12-17 LAB — DIFFERENTIAL
Basophils Absolute: 0.1 10*3/uL (ref 0.0–0.1)
Eosinophils Absolute: 0.1 10*3/uL (ref 0.0–0.7)
Eosinophils Relative: 1 % (ref 0–5)

## 2010-12-17 MED ORDER — ENOXAPARIN SODIUM 40 MG/0.4ML ~~LOC~~ SOLN
40.0000 mg | SUBCUTANEOUS | Status: DC
  Administered 2010-12-17: 40 mg via SUBCUTANEOUS
  Filled 2010-12-17: qty 0.4

## 2010-12-17 MED ORDER — LISINOPRIL 10 MG PO TABS
40.0000 mg | ORAL_TABLET | Freq: Every day | ORAL | Status: DC
  Administered 2010-12-18: 40 mg via ORAL
  Filled 2010-12-17: qty 4

## 2010-12-17 MED ORDER — HYDROCHLOROTHIAZIDE 12.5 MG PO CAPS
12.5000 mg | ORAL_CAPSULE | Freq: Every day | ORAL | Status: DC
  Filled 2010-12-17 (×2): qty 1

## 2010-12-17 MED ORDER — ONDANSETRON HCL 4 MG/2ML IJ SOLN: 4.0000 mg | Freq: Four times a day (QID) | INTRAMUSCULAR | Status: DC | PRN

## 2010-12-17 MED ORDER — HYDRALAZINE HCL 20 MG/ML IJ SOLN
10.0000 mg | Freq: Once | INTRAMUSCULAR | Status: AC
  Administered 2010-12-17: 10 mg via INTRAVENOUS
  Filled 2010-12-17: qty 1

## 2010-12-17 MED ORDER — SENNOSIDES-DOCUSATE SODIUM 8.6-50 MG PO TABS: 1.0000 | ORAL_TABLET | Freq: Every day | ORAL | Status: DC | PRN

## 2010-12-17 MED ORDER — BISACODYL 10 MG RE SUPP: 10.0000 mg | RECTAL | Status: DC | PRN

## 2010-12-17 MED ORDER — ASPIRIN EC 325 MG PO TBEC
325.0000 mg | DELAYED_RELEASE_TABLET | Freq: Every day | ORAL | Status: DC
  Administered 2010-12-18: 325 mg via ORAL
  Filled 2010-12-17: qty 1

## 2010-12-17 MED ORDER — AMLODIPINE BESYLATE 5 MG PO TABS
10.0000 mg | ORAL_TABLET | Freq: Every day | ORAL | Status: DC
  Administered 2010-12-18: 10 mg via ORAL
  Filled 2010-12-17: qty 2

## 2010-12-17 MED ORDER — ONDANSETRON HCL 4 MG PO TABS: 4.0000 mg | ORAL_TABLET | Freq: Four times a day (QID) | ORAL | Status: DC | PRN

## 2010-12-17 MED ORDER — TRAZODONE HCL 50 MG PO TABS: 25.0000 mg | ORAL_TABLET | Freq: Every evening | ORAL | Status: DC | PRN

## 2010-12-17 MED ORDER — ACETAMINOPHEN 325 MG PO TABS
650.0000 mg | ORAL_TABLET | Freq: Four times a day (QID) | ORAL | Status: DC | PRN
  Administered 2010-12-18: 650 mg via ORAL
  Filled 2010-12-17: qty 2

## 2010-12-17 MED ORDER — HYDRALAZINE HCL 20 MG/ML IJ SOLN
10.0000 mg | INTRAMUSCULAR | Status: DC | PRN
  Administered 2010-12-17: 10 mg via INTRAVENOUS
  Filled 2010-12-17: qty 1

## 2010-12-17 MED ORDER — ACETAMINOPHEN 650 MG RE SUPP: 650.0000 mg | Freq: Four times a day (QID) | RECTAL | Status: DC | PRN

## 2010-12-17 MED ORDER — FLEET ENEMA 7-19 GM/118ML RE ENEM: 1.0000 | ENEMA | RECTAL | Status: DC | PRN

## 2010-12-17 MED ORDER — NICOTINE 14 MG/24HR TD PT24
14.0000 mg | MEDICATED_PATCH | Freq: Once | TRANSDERMAL | Status: DC
  Administered 2010-12-17: 14 mg via TRANSDERMAL
  Filled 2010-12-17: qty 1

## 2010-12-17 MED ORDER — SODIUM CHLORIDE 0.9 % IJ SOLN: 3.0000 mL | INTRAMUSCULAR | Status: DC | PRN

## 2010-12-17 MED ORDER — NICOTINE 21 MG/24HR TD PT24
21.0000 mg | MEDICATED_PATCH | Freq: Every day | TRANSDERMAL | Status: DC
  Administered 2010-12-18: 21 mg via TRANSDERMAL
  Filled 2010-12-17 (×2): qty 1

## 2010-12-17 NOTE — ED Notes (Signed)
hospitalist in room w/ pt at this time.

## 2010-12-17 NOTE — ED Notes (Signed)
Was at [facility]. Was told to come here because his bp was high.

## 2010-12-17 NOTE — ED Provider Notes (Signed)
Scribed for Dr. Terressa Koyanagi, the patient was seen in room 4. This chart was scribed by [employer]. This patient's care was started at 4:15PM.    History     CSN: [id number] Arrival date & time: 12/17/2010  3:55 PM  Chief Complaint  Patient presents with  . Hypertension   Patient is a 65 y.o. male presenting with hypertension. The history is provided by the patient.  Hypertension The problem has not changed since onset.Pertinent negatives include no chest pain, no abdominal pain and no headaches.    Patient presents to the ED with complaints of HTN. He was referred to the Emergency room from the health center. Patient denies abdominal pain, headache, chest pain, shortness of breath, nausea, vomiting, loss of vision, speech difficulty or rash. He states that he measured a 200 systolic BP at the health center this afternoon. He states that he knows that he had a history of HTN 7 years ago. No other complaints.  No DOE, SOB, No CP, n/v/d. No Ha, no dizziness no focal neuro deficits.    Past Medical History  Diagnosis Date  . Hypertension   . Arthritis     History reviewed. No pertinent past surgical history.  Family History  Problem Relation Age of Onset  . Hypertension Mother     History  Substance Use Topics  . Smoking status: Current Everyday Smoker -- 1.0 packs/day    Types: Cigarettes  . Smokeless tobacco: Not on file  . Alcohol Use: No      Review of Systems  Constitutional: Negative for activity change.  HENT: Negative for hearing loss and neck pain.   Eyes: Negative for photophobia, pain and visual disturbance.  Respiratory: Negative for cough.   Cardiovascular: Negative for chest pain.  Gastrointestinal: Negative for nausea, vomiting, abdominal pain and diarrhea.  Genitourinary: Negative for dysuria.  Musculoskeletal: Negative for gait problem.  Skin: Negative for pallor and rash.  Neurological: Negative for light-headedness and headaches.    Psychiatric/Behavioral: Negative for agitation.    Physical Exam  BP 193/97  Pulse 68  Temp(Src) 97.9 F (36.6 C) (Oral)  Resp 20  Ht 5\' 10"  (1.778 m)  Wt 180 lb (81.647 kg)  BMI 25.83 kg/m2  SpO2 98%  Physical Exam  Constitutional: He is oriented to person, place, and time. He appears well-developed and well-nourished. No distress.  HENT:  Mouth/Throat: No oral lesions.  Eyes: Conjunctivae and EOM are normal. Pupils are equal, round, and reactive to light. Right eye exhibits no discharge.  Neck: Normal range of motion. Neck supple. No JVD present. No tracheal tenderness present. Carotid bruit is not present. No thyromegaly present.  Cardiovascular: Regular rhythm and normal heart sounds.   No murmur heard. Pulses:      Radial pulses are 2+ on the right side, and 2+ on the left side.       2+ dorsal pedis, 2+ brachial radialis  Pulmonary/Chest: No stridor. No respiratory distress. He has no wheezes. He has no rales. He exhibits no tenderness.  Abdominal: Soft. Bowel sounds are normal. There is no tenderness. There is no rebound, no guarding and no CVA tenderness.  Genitourinary:          Chaperone present during exam.   Musculoskeletal: Normal range of motion. He exhibits no edema.       Right shoulder: He exhibits no swelling.  Lymphadenopathy:    He has no cervical adenopathy.  Neurological: He is alert and oriented to person, place, and time. He  has normal reflexes. No cranial nerve deficit. GCS eye subscore is 4. GCS verbal subscore is 5. GCS motor subscore is 6.       5/5 strength. 2+ DTRs bilaterally.   Skin: Skin is warm and dry. He is not diaphoretic. No erythema.  Psychiatric: He has a normal mood and affect. His behavior is normal.    ED Course  Procedures  OTHER DATA REVIEWED: Nursing notes, vital signs, and past medical records reviewed.  DIAGNOSTIC STUDIES: Oxygen Saturation is 100% on room air, normal by my interpretation.     Date: 12/17/2010   Rate: 58  Rhythm: sinus bradycardia  QRS Axis: normal  Intervals: normal  ST/T Wave abnormalities: ST depressions laterally  Conduction Disutrbances:none  Narrative Interpretation: LVH and ST changes laterall  Old EKG Reviewed: none available     LABS / RADIOLOGY:  Results for orders placed during the hospital encounter of 12/17/10  CBC      Component Value Range   WBC 8.0  4.0 - 10.5 (K/uL)   RBC 4.77  4.22 - 5.81 (MIL/uL)   Hemoglobin 14.2  13.0 - 17.0 (g/dL)   HCT 16.1  09.6 - 04.5 (%)   MCV 89.7  78.0 - 100.0 (fL)   MCH 29.8  26.0 - 34.0 (pg)   MCHC 33.2  30.0 - 36.0 (g/dL)   RDW 40.9  81.1 - 91.4 (%)   Platelets 229  150 - 400 (K/uL)  DIFFERENTIAL      Component Value Range   Neutrophils Relative 63  43 - 77 (%)   Neutro Abs 5.1  1.7 - 7.7 (K/uL)   Lymphocytes Relative 28  12 - 46 (%)   Lymphs Abs 2.3  0.7 - 4.0 (K/uL)   Monocytes Relative 7  3 - 12 (%)   Monocytes Absolute 0.5  0.1 - 1.0 (K/uL)   Eosinophils Relative 1  0 - 5 (%)   Eosinophils Absolute 0.1  0.0 - 0.7 (K/uL)   Basophils Relative 1  0 - 1 (%)   Basophils Absolute 0.1  0.0 - 0.1 (K/uL)  BASIC METABOLIC PANEL      Component Value Range   Sodium 140  135 - 145 (mEq/L)   Potassium 3.5  3.5 - 5.1 (mEq/L)   Chloride 103  96 - 112 (mEq/L)   CO2 23  19 - 32 (mEq/L)   Glucose, Bld 99  70 - 99 (mg/dL)   BUN 13  6 - 23 (mg/dL)   Creatinine, Ser 7.82  0.50 - 1.35 (mg/dL)   Calcium 95.6  8.4 - 10.5 (mg/dL)   GFR calc non Af Amer >60  >60 (mL/min)   GFR calc Af Amer >60  >60 (mL/min)  URINALYSIS, ROUTINE W REFLEX MICROSCOPIC      Component Value Range   Color, Urine YELLOW  YELLOW    Appearance CLEAR  CLEAR    Specific Gravity, Urine 1.020  1.005 - 1.030    pH 7.0  5.0 - 8.0    Glucose, UA NEGATIVE  NEGATIVE (mg/dL)   Hgb urine dipstick NEGATIVE  NEGATIVE    Bilirubin Urine NEGATIVE  NEGATIVE    Ketones, ur NEGATIVE  NEGATIVE (mg/dL)   Protein, ur NEGATIVE  NEGATIVE (mg/dL)   Urobilinogen, UA 0.2   0.0 - 1.0 (mg/dL)   Nitrite NEGATIVE  NEGATIVE    Leukocytes, UA NEGATIVE  NEGATIVE   CARDIAC PANEL(CRET KIN+CKTOT+MB+TROPI)      Component Value Range   Total CK 226  7 -  232 (U/L)   CK, MB 4.6 (*) 0.3 - 4.0 (ng/mL)   Troponin I <0.30  <0.30 (ng/mL)   Relative Index 2.0  0.0 - 2.5    Dg Chest 2 View  12/17/2010  *RADIOLOGY REPORT*  Clinical Data: Hypertension  CHEST - 2 VIEW  Comparison: None.  Findings: The lungs are clear without focal infiltrate, edema, pneumothorax or pleural effusion. The cardiopericardial silhouette is within normal limits for size. Imaged bony structures of the thorax are intact.  IMPRESSION: No acute findings.  Original Report Authenticated By: ERIC A. MANSELL, M.D.   US Scrotum  12/17/2010  *RADIOLOGY REPORT*  Clinical Data:  Scrotal swelling, evaluate for torsion  SCROTAL ULTRASOUND DOPPLER ULTRASOUND OF THE TESTICLES  Technique: Complete ultrasound examination of the testicles, epididymis, and other scrotal structures was performed.  Color and spectral Doppler ultrasound were also utilized to evaluate blood flow to the testicles.  Comparison:  None  Findings:  Right testis:  Normal in size and appearance, measuring 3.8 x 2.5 x 2.7 cm  Left testis:  Normal in size and appearance, measuring 3.9 x 3.2 x 3.5 cm in  Right epididymis:  Normal in size and appearance.  Left epididymis:  Not well visualized.  Hydocele:  Moderate left hydrocele.  Varicocele:  Absent.  Pulsed Doppler interrogation of both testes demonstrates low resistance flow bilaterally.  IMPRESSION: Normal sonographic appearance of the bilateral testes.  No evidence of testicular torsion.  Moderate left hydrocele.  Original Report Authenticated By: Charline Bills, M.D.   [place] Art/ven Flow Abd Pelv Doppler  12/17/2010  *RADIOLOGY REPORT*  Clinical Data:  Scrotal swelling, evaluate for torsion  SCROTAL ULTRASOUND DOPPLER ULTRASOUND OF THE TESTICLES  Technique: Complete ultrasound examination of the testicles,  epididymis, and other scrotal structures was performed.  Color and spectral Doppler ultrasound were also utilized to evaluate blood flow to the testicles.  Comparison:  None  Findings:  Right testis:  Normal in size and appearance, measuring 3.8 x 2.5 x 2.7 cm  Left testis:  Normal in size and appearance, measuring 3.9 x 3.2 x 3.5 cm in  Right epididymis:  Normal in size and appearance.  Left epididymis:  Not well visualized.  Hydocele:  Moderate left hydrocele.  Varicocele:  Absent.  Pulsed Doppler interrogation of both testes demonstrates low resistance flow bilaterally.  IMPRESSION: Normal sonographic appearance of the bilateral testes.  No evidence of testicular torsion.  Moderate left hydrocele.  Original Report Authenticated By: Charline Bills, M.D.      PROCEDURES:   ED COURSE / COORDINATION OF CARE: 16:31  Orders Placed This Encounter  Procedures  . DG Chest 2 View  . US Scrotum  . [place] Art/Ven Flow Abd Pelv Doppler  . CBC  . Differential  . Basic metabolic panel  . Urinalysis, Routine w reflex microscopic  . Cardiac panel Timed (cret kin+cktot+mb+tropi)  . Consult to hospitalist  . ED EKG   7:45PM- Phone consult completed with Dr. Orvan Falconer completed. Patient case explained and discussed. Dr. Orvan Falconer agrees to evaluated patient in ED for admission.  7:55PM - Terrick Allred K Tanyiah Laurich-Rasch, MD and Dr Orvan Falconer checked on patient and discussed transfer.   MDM: Differential Diagnosis: HTN urgency and varicocele    IMPRESSION: Diagnoses that have been ruled out:  Diagnoses that are still under consideration:  Final diagnoses:      PLAN: admit rule out  MEDICATIONS GIVEN IN THE E.D.  Medications  aspirin 325 MG EC tablet (not administered)  acetaminophen (TYLENOL) 650 MG CR  tablet (not administered)  hydrALAZINE (APRESOLINE) injection 10 mg (10 mg Intravenous Given 12/17/10 1720)     DISCHARGE MEDICATIONS: New Prescriptions   No medications on file        I  personally performed the services described in this documentation, which was scribed in my presence. The recorded information has been reviewed and considered. Janzen Sacks Smitty Cords, MD   Catlyn Shipton Smitty Cords, MD 12/17/10 2050

## 2010-12-17 NOTE — ED Notes (Signed)
Pt advises he is going to stay. Denies any pain at this time. Awaiting bed assignment.

## 2010-12-18 DIAGNOSIS — I517 Cardiomegaly: Secondary | ICD-10-CM

## 2010-12-18 LAB — BASIC METABOLIC PANEL
BUN: 13 mg/dL (ref 6–23)
GFR calc Af Amer: >60 mL/min (ref >60)
GFR calc non Af Amer: >60 mL/min (ref >60)
Potassium: 3.2 mEq/L — ABNORMAL LOW (ref 3.5–5.1)
Sodium: 142 mEq/L (ref 135–145)

## 2010-12-18 MED ORDER — NICOTINE 14 MG/24HR TD PT24: 1.0000 | MEDICATED_PATCH | Freq: Once | TRANSDERMAL | Status: DC

## 2010-12-18 MED ORDER — POTASSIUM CHLORIDE CRYS ER 20 MEQ PO TBCR: 40.0000 meq | EXTENDED_RELEASE_TABLET | Freq: Two times a day (BID) | ORAL | Status: DC

## 2010-12-18 MED ORDER — HYDROCHLOROTHIAZIDE 25 MG PO TABS
12.5000 mg | ORAL_TABLET | Freq: Every day | ORAL | Status: DC
  Administered 2010-12-18: 11:00:00 via ORAL
  Filled 2010-12-18: qty 1

## 2010-12-18 MED ORDER — HYDROCHLOROTHIAZIDE 25 MG PO TABS: 25.0000 mg | ORAL_TABLET | Freq: Every day | ORAL | Status: DC

## 2010-12-18 MED ORDER — METOPROLOL TARTRATE 50 MG PO TABS: 50.0000 mg | ORAL_TABLET | Freq: Two times a day (BID) | ORAL | Status: DC

## 2010-12-18 NOTE — Progress Notes (Signed)
*  PRELIMINARY RESULTS* Echocardiogram 2D Echocardiogram has been performed.  Gabriel Poole [place] 12/18/2010, 10:25 AM2

## 2010-12-18 NOTE — Progress Notes (Signed)
Prescriptions given with understanding stated of discharge instruction.

## 2010-12-18 NOTE — H&P (Signed)
PCP:   No primary provider on file.    Chief Complaint:  Uncontrolled BP  HPI: 55ry-old AAM, [facility] for hypertensive urgency. Patient has a long-standing history of hypertension but has not been taking medication regularly since he lost his job about 4-5 years ago. He does report that he episodically takes Diovan and Norvasc but has not been regularly has not taken any medication for some time. Usually checks his blood pressure at the pharmacy and usually has a systolic blood pressure over 200 and eventually today his girlfriend forced him to go to the public health department to get treated because she was feeling sick. He was found to have a blood pressure in the range of 220/12o and appearance from further evaluation.  Despite intravenous medications patient's blood pressure remained elevated in the emergency room, and his EKG shows symmetrical T-wave inversions, so hospitalist service was called to assist with management.  The patient required persuasion for about half an hour before he agreed for admission because he is concerned about cost since he has no health insurance.  He denies chest pain shortness of breath lower extremity edema PND or in fact any symptoms related to a high blood pressure.  His main concern is the isn't grossly enlarged scrotum after many years and which makes activity ivery uncomfortable. He does admit to urinary frequency.  Review of Systems:  The patient denies anorexia, fever, weight loss,, vision loss, decreased hearing, hoarseness, chest pain, syncope, dyspnea on exertion, peripheral edema, balance deficits, hemoptysis, abdominal pain, melena, hematochezia, severe indigestion/heartburn, hematuria, incontinence, genital sores, muscle weakness, suspicious skin lesions, transient blindness, difficulty walking, depression, unusual weight change, abnormal bleeding, enlarged lymph nodes, angioedema, and breast masses.  Past Medical  History: Past Medical History  Diagnosis Date  . Hypertension   . Arthritis    Past Surgical History  Procedure Date  . Finger surgery     Lt middle     Medications: Prior to Admission medications   Medication Sig Start Date End Date Taking? Authorizing Provider  acetaminophen (TYLENOL) 650 MG CR tablet Take 650 mg by mouth daily as needed. For pain    Yes Historical Provider, MD  aspirin 325 MG EC tablet Take 325 mg by mouth daily.     Yes Historical Provider, MD    Allergies:  No Known Allergies  Social History:  reports that he has been smoking Cigarettes.  He has been smoking about 1 pack per day x 56yrs. He does not have any smokeless tobacco history on file. He reports that he does not drink alcohol or use illicit drugs.  Cook at [employer].  Family History: Family History  Problem Relation Age of Onset  . Hypertension Mother     Physical Exam: Filed Vitals:   12/17/10 1724 12/17/10 1857 12/17/10 2003 12/17/10 2127  BP: 201/98 193/97 196/100 191/99  Pulse:  68 64 57  Temp:    98.4 F (36.9 C)  TempSrc:      Resp:   18 16  Height:    5\' 10"  (1.778 m)  Weight:    79.379 kg (175 lb)  SpO2: 98% 98% 97% 98%   General appearance: alert, cooperative and no distress Head: Normocephalic, without obvious abnormality, atraumatic Nose: Nares normal. Septum midline. Mucosa normal. No drainage or sinus tenderness. Throat: lips, mucosa, and tongue normal; teeth and gums normal Resp: clear to auscultation bilaterally Chest wall: no tenderness Cardio: regular rate and rhythm, S1, S2  normal, no murmur, click, rub or gallop GI: soft, non-tender; bowel sounds normal; no masses,  no organomegaly Male genitalia: normal, abnormal findings: grapefuit sized scrotum; non-tender; no massses felt; penis normal. Extremities: extremities normal, atraumatic, no cyanosis or edema and arthritic deformity knees. Pulses: 2+ and symmetric Skin: Skin color, texture, turgor  normal. No rashes or lesions Neurologic: Grossly normal   Labs on Admission:   [facility] 12/17/10 1648  NA 140  K 3.5  CL 103  CO2 23  GLUCOSE 99  BUN 13  CREATININE 0.84  CALCIUM 10.0  MG --  PHOS --    Basename 12/17/10 2149  AST 18  ALT 14  ALKPHOS 78  BILITOT 0.3  PROT 7.6  ALBUMIN 4.0   No results found for this basename: LIPASE:2,AMYLASE:2 in the last 72 hours  Basename 12/17/10 1648  WBC 8.0  NEUTROABS 5.1  HGB 14.2  HCT 42.8  MCV 89.7  PLT 229    Basename 12/17/10 1648  CKTOTAL 226  CKMB 4.6*  CKMBINDEX --  TROPONINI <0.30   No results found for this basename: TSH,T4TOTAL,FREET3,T3FREE,THYROIDAB in the last 72 hours No results found for this basename: VITAMINB12:2,FOLATE:2,FERRITIN:2,TIBC:2,IRON:2,RETICCTPCT:2 in the last 72 hours  Radiological Exams on Admission: Dg Chest 2 View  12/17/2010  *RADIOLOGY REPORT*  Clinical Data: Hypertension  CHEST - 2 VIEW  Comparison: None.  Findings: The lungs are clear without focal infiltrate, edema, pneumothorax or pleural effusion. The cardiopericardial silhouette is within normal limits for size. Imaged bony structures of the thorax are intact.  IMPRESSION: No acute findings.  Original Report Authenticated By: ERIC A. MANSELL, M.D.   US Scrotum  12/17/2010  *RADIOLOGY REPORT*  Clinical Data:  Scrotal swelling, evaluate for torsion  SCROTAL ULTRASOUND DOPPLER ULTRASOUND OF THE TESTICLES  Technique: Complete ultrasound examination of the testicles, epididymis, and other scrotal structures was performed.  Color and spectral Doppler ultrasound were also utilized to evaluate blood flow to the testicles.  Comparison:  None  Findings:  Right testis:  Normal in size and appearance, measuring 3.8 x 2.5 x 2.7 cm  Left testis:  Normal in size and appearance, measuring 3.9 x 3.2 x 3.5 cm in  Right epididymis:  Normal in size and appearance.  Left epididymis:  Not well visualized.  Hydocele:  Moderate left hydrocele.  Varicocele:   Absent.  Pulsed Doppler interrogation of both testes demonstrates low resistance flow bilaterally.  IMPRESSION: Normal sonographic appearance of the bilateral testes.  No evidence of testicular torsion.  Moderate left hydrocele.  Original Report Authenticated By: Charline Bills, M.D.   [place] Art/ven Flow Abd Pelv Doppler  12/17/2010  *RADIOLOGY REPORT*  Clinical Data:  Scrotal swelling, evaluate for torsion  SCROTAL ULTRASOUND DOPPLER ULTRASOUND OF THE TESTICLES  Technique: Complete ultrasound examination of the testicles, epididymis, and other scrotal structures was performed.  Color and spectral Doppler ultrasound were also utilized to evaluate blood flow to the testicles.  Comparison:  None  Findings:  Right testis:  Normal in size and appearance, measuring 3.8 x 2.5 x 2.7 cm  Left testis:  Normal in size and appearance, measuring 3.9 x 3.2 x 3.5 cm in  Right epididymis:  Normal in size and appearance.  Left epididymis:  Not well visualized.  Hydocele:  Moderate left hydrocele.  Varicocele:  Absent.  Pulsed Doppler interrogation of both testes demonstrates low resistance flow bilaterally.  IMPRESSION: Normal sonographic appearance of the bilateral testes.  No evidence of testicular torsion.  Moderate left hydrocele.  Original Report Authenticated  By: Charline Bills, M.D.    Assessment/Plan Present on Admission:   hypertensive urgency. 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  Plan: We will bring this gentleman on observation and for improved control of his blood pressure hopefully can bring it to a reasonable level the next 18-24 hour and he can be discharged home with a prescription to follow up with the public health will need to see urologist for his hydrocele and this has been explained that this is not necessarily an inpatient problem. Because of his abnormal EKG 2-D echo has been ordered . Because of starting him on large dose ACE  inhibitor we'll check his serum creatinine in the morning other plans as per orders. Reegan Bouffard 12/18/2010, 12:54 AM

## 2010-12-18 NOTE — Discharge Summary (Signed)
Physician Discharge Summary  Patient ID: Gabriel Poole MRN: [id number] DOB/AGE: February 13, 1946 65 y.o.  Admit date: 12/17/2010 Discharge date: 12/18/2010  Discharge Diagnoses:  Principal Problem:  *Hypertensive urgency Active Problems:  Hydrocele  Tobacco abuse   Current Discharge Medication List    START taking these medications   Details  hydrochlorothiazide 25 MG tablet Take 1 tablet (25 mg total) by mouth daily. Qty: 30 tablet, Refills: 0    metoprolol (LOPRESSOR) 50 MG tablet Take 1 tablet (50 mg total) by mouth 2 (two) times daily. Qty: 60 tablet, Refills: 0      CONTINUE these medications which have NOT CHANGED   Details  acetaminophen (TYLENOL) 650 MG CR tablet Take 650 mg by mouth daily as needed. For pain     aspirin 325 MG EC tablet Take 325 mg by mouth daily.          Discharge Orders    Future Orders Please Complete By Expires   Diet - low sodium heart healthy      Discharge instructions      Comments:   May return to work on 12/20/2010. Quit smoking.      Follow-up Information    Follow up with JAVAID,MOHAMMAD I. (for hydrocele)    Contact information:   [address] [place] [address] [phone]       [facility] up with [facility] health Department. Make an appointment in 1 week.         Disposition: Home  Discharged Condition: Stable  Consults:   none  Labs:   Results for orders placed during the hospital encounter of 12/17/10 (from the past 48 hour(s))  CBC     Status: Normal   Collection Time   12/17/10  4:48 PM      Component Value Range Comment   WBC 8.0  4.0 - 10.5 (K/uL)    RBC 4.77  4.22 - 5.81 (MIL/uL)    Hemoglobin 14.2  13.0 - 17.0 (g/dL)    HCT 46.9  62.9 - 52.8 (%)    MCV 89.7  78.0 - 100.0 (fL)    MCH 29.8  26.0 - 34.0 (pg)    MCHC 33.2  30.0 - 36.0 (g/dL)    RDW 41.3  24.4 - 01.0 (%)    Platelets 229  150 - 400 (K/uL)   DIFFERENTIAL     Status: Normal   Collection Time   12/17/10  4:48 PM       Component Value Range Comment   Neutrophils Relative 63  43 - 77 (%)    Neutro Abs 5.1  1.7 - 7.7 (K/uL)    Lymphocytes Relative 28  12 - 46 (%)    Lymphs Abs 2.3  0.7 - 4.0 (K/uL)    Monocytes Relative 7  3 - 12 (%)    Monocytes Absolute 0.5  0.1 - 1.0 (K/uL)    Eosinophils Relative 1  0 - 5 (%)    Eosinophils Absolute 0.1  0.0 - 0.7 (K/uL)    Basophils Relative 1  0 - 1 (%)    Basophils Absolute 0.1  0.0 - 0.1 (K/uL)   BASIC METABOLIC PANEL     Status: Normal   Collection Time   12/17/10  4:48 PM      Component Value Range Comment   Sodium 140  135 - 145 (mEq/L)    Potassium 3.5  3.5 - 5.1 (mEq/L)    Chloride 103  96 - 112 (mEq/L)  CO2 23  19 - 32 (mEq/L)    Glucose, Bld 99  70 - 99 (mg/dL)    BUN 13  6 - 23 (mg/dL)    Creatinine, Ser 1.61  0.50 - 1.35 (mg/dL)    Calcium 09.6  8.4 - 10.5 (mg/dL)    GFR calc non Af Amer >60  >60 (mL/min)    GFR calc Af Amer >60  >60 (mL/min)   CARDIAC PANEL(CRET KIN+CKTOT+MB+TROPI)     Status: Abnormal   Collection Time   12/17/10  4:48 PM      Component Value Range Comment   Total CK 226  7 - 232 (U/L)    CK, MB 4.6 (*) 0.3 - 4.0 (ng/mL)    Troponin I <0.30  <0.30 (ng/mL)    Relative Index 2.0  0.0 - 2.5    URINALYSIS, ROUTINE W REFLEX MICROSCOPIC     Status: Normal   Collection Time   12/17/10  5:44 PM      Component Value Range Comment   Color, Urine YELLOW  YELLOW     Appearance CLEAR  CLEAR     Specific Gravity, Urine 1.020  1.005 - 1.030     pH 7.0  5.0 - 8.0     Glucose, UA NEGATIVE  NEGATIVE (mg/dL)    Hgb urine dipstick NEGATIVE  NEGATIVE     Bilirubin Urine NEGATIVE  NEGATIVE     Ketones, ur NEGATIVE  NEGATIVE (mg/dL)    Protein, ur NEGATIVE  NEGATIVE (mg/dL)    Urobilinogen, UA 0.2  0.0 - 1.0 (mg/dL)    Nitrite NEGATIVE  NEGATIVE     Leukocytes, UA NEGATIVE  NEGATIVE  MICROSCOPIC NOT DONE ON URINES WITH NEGATIVE PROTEIN, BLOOD, LEUKOCYTES, NITRITE, OR GLUCOSE <1000 mg/dL.  HEPATIC FUNCTION PANEL     Status: Abnormal    Collection Time   12/17/10  9:49 PM      Component Value Range Comment   Total Protein 7.6  6.0 - 8.3 (g/dL)    Albumin 4.0  3.5 - 5.2 (g/dL)    AST 18  0 - 37 (U/L)    ALT 14  0 - 53 (U/L)    Alkaline Phosphatase 78  39 - 117 (U/L)    Total Bilirubin 0.3  0.3 - 1.2 (mg/dL)    Bilirubin, Direct 0.1  0.0 - 0.3 (mg/dL)    Indirect Bilirubin 0.2 (*) 0.3 - 0.9 (mg/dL)   BASIC METABOLIC PANEL     Status: Abnormal   Collection Time   12/18/10  4:48 AM      Component Value Range Comment   Sodium 142  135 - 145 (mEq/L)    Potassium 3.2 (*) 3.5 - 5.1 (mEq/L)    Chloride 106  96 - 112 (mEq/L)    CO2 22  19 - 32 (mEq/L)    Glucose, Bld 91  70 - 99 (mg/dL)    BUN 13  6 - 23 (mg/dL)    Creatinine, Ser 0.45  0.50 - 1.35 (mg/dL)    Calcium 9.7  8.4 - 10.5 (mg/dL)    GFR calc non Af Amer >60  >60 (mL/min)    GFR calc Af Amer >60  >60 (mL/min)     Diagnostics:  Dg Chest 2 View  12/17/2010  *RADIOLOGY REPORT*  Clinical Data: Hypertension  CHEST - 2 VIEW  Comparison: None.  Findings: The lungs are clear without focal infiltrate, edema, pneumothorax or pleural effusion. The cardiopericardial silhouette is within normal limits for size.  Imaged bony structures of the thorax are intact.  IMPRESSION: No acute findings.  Original Report Authenticated By: ERIC A. MANSELL, M.D.   US Scrotum  12/17/2010  *RADIOLOGY REPORT*  Clinical Data:  Scrotal swelling, evaluate for torsion  SCROTAL ULTRASOUND DOPPLER ULTRASOUND OF THE TESTICLES  Technique: Complete ultrasound examination of the testicles, epididymis, and other scrotal structures was performed.  Color and spectral Doppler ultrasound were also utilized to evaluate blood flow to the testicles.  Comparison:  None  Findings:  Right testis:  Normal in size and appearance, measuring 3.8 x 2.5 x 2.7 cm  Left testis:  Normal in size and appearance, measuring 3.9 x 3.2 x 3.5 cm in  Right epididymis:  Normal in size and appearance.  Left epididymis:  Not well visualized.   Hydocele:  Moderate left hydrocele.  Varicocele:  Absent.  Pulsed Doppler interrogation of both testes demonstrates low resistance flow bilaterally.  IMPRESSION: Normal sonographic appearance of the bilateral testes.  No evidence of testicular torsion.  Moderate left hydrocele.  Original Report Authenticated By: Charline Bills, M.D.   [place] Art/ven Flow Abd Pelv Doppler  12/17/2010  *RADIOLOGY REPORT*  Clinical Data:  Scrotal swelling, evaluate for torsion  SCROTAL ULTRASOUND DOPPLER ULTRASOUND OF THE TESTICLES  Technique: Complete ultrasound examination of the testicles, epididymis, and other scrotal structures was performed.  Color and spectral Doppler ultrasound were also utilized to evaluate blood flow to the testicles.  Comparison:  None  Findings:  Right testis:  Normal in size and appearance, measuring 3.8 x 2.5 x 2.7 cm  Left testis:  Normal in size and appearance, measuring 3.9 x 3.2 x 3.5 cm in  Right epididymis:  Normal in size and appearance.  Left epididymis:  Not well visualized.  Hydocele:  Moderate left hydrocele.  Varicocele:  Absent.  Pulsed Doppler interrogation of both testes demonstrates low resistance flow bilaterally.  IMPRESSION: Normal sonographic appearance of the bilateral testes.  No evidence of testicular torsion.  Moderate left hydrocele.  Original Report Authenticated By: Charline Bills, M.D.   Full Code   Hospital Course: The patient is a 65 year old black male with a history of hypertension and medical noncompliance who was sent to the emergency room with hypertensive emergency. He had been taking his blood pressure periodically at the drug store with his girlfriend. It was consistently elevated. Finally, his girlfriend convinced him to go to the [facility] health Department. His blood pressure there was about 220/120. He really had no significant chief complaint other than elevated blood pressure. An EKG was done but is not currently in the chart. According to H&P, it  was significant for he some T-wave inversions. He had no chest pain. In the emergency room, his blood pressure was 201/98. He had a normal heart rate and his other vital signs are normal. He had poor dentition and a nontender mass in the scrotum which was consistent with hydrocele. He was admitted to the hospitalist service overnight. He was started on antihypertensives. At the time of discharge, his blood pressure is improved. He is stable for discharge. He will be referred to urology as an outpatient to evaluate the hydrocele. The patient prefers to have surgery if indicated. He also is hypokalemic and this will be corrected. He is encouraged to quit smoking and be compliant with his medications. He had an echocardiogram ordered by the admitting physician. This will need to be followed up at the health department.  Discharge Exam: Blood pressure 172/90, pulse 63, temperature 98.3 F (  36.8 C), temperature source Oral, resp. rate 20, height 5\' 10"  (1.778 m), weight 80.287 kg (177 lb), SpO2 97.00%. Throat: lips, mucosa, and tongue normal; poor dentition Resp: clear to auscultation bilaterally Cardio: regular rate and rhythm, S1, S2 normal, no murmur, click, rub or gallop GI: soft, non-tender; bowel sounds normal; no masses,  no organomegaly Extremities: extremities normal, atraumatic, no cyanosis or edema   Signed: Brileigh Sevcik L 12/18/2010, 12:14 PM

## 2010-12-22 NOTE — Progress Notes (Signed)
Encounter addended by: Ree Shay, RN on: 12/22/2010  2:06 PM<BR>     Documentation filed: Charges VN

## 2011-01-08 NOTE — Progress Notes (Signed)
Encounter addended by: Clarene Critchley on: 01/08/2011  3:03 PM<BR>     Documentation filed: Flowsheet VN

## 2011-02-05 ENCOUNTER — Encounter (HOSPITAL_COMMUNITY): Payer: Self-pay

## 2011-02-05 ENCOUNTER — Encounter (HOSPITAL_COMMUNITY)
Admission: RE | Admit: 2011-02-05 | Discharge: 2011-02-05 | Disposition: A | Discharge status: Home / Self Care | Payer: 59 | Source: Ambulatory Visit | Attending: Urology | Admitting: Urology

## 2011-02-05 HISTORY — DX: Cardiac murmur, unspecified: R01.1

## 2011-02-05 LAB — BASIC METABOLIC PANEL
GFR calc Af Amer: >60 mL/min (ref >60)
GFR calc non Af Amer: 57 mL/min — ABNORMAL LOW (ref >60)
Glucose, Bld: 75 mg/dL (ref 70–99)
Potassium: 3.2 mEq/L — ABNORMAL LOW (ref 3.5–5.1)
Sodium: 141 mEq/L (ref 135–145)

## 2011-02-05 LAB — DIFFERENTIAL
Basophils Absolute: 0 10*3/uL (ref 0.0–0.1)
Basophils Relative: 1 % (ref 0–1)
Eosinophils Absolute: 0.1 10*3/uL (ref 0.0–0.7)
Lymphs Abs: 3.1 10*3/uL (ref 0.7–4.0)
Neutrophils Relative %: 56 % (ref 43–77)

## 2011-02-05 LAB — CBC
MCH: 29.8 pg (ref 26.0–34.0)
Platelets: 245 10*3/uL (ref 150–400)
RBC: 4.49 MIL/uL (ref 4.22–5.81)

## 2011-02-05 LAB — SURGICAL PCR SCREEN
MRSA, PCR: NEGATIVE
Staphylococcus aureus: NEGATIVE

## 2011-02-05 NOTE — Patient Instructions (Addendum)
PATIENT INSTRUCTIONS POST-ANESTHESIA  IMMEDIATELY FOLLOWING SURGERY:  Do not drive or operate machinery for the first twenty four hours after surgery.  Do not make any important decisions for twenty four hours after surgery or while taking narcotic pain medications or sedatives.  If you develop intractable nausea and vomiting or a severe headache please notify your doctor immediately.  FOLLOW-UP:  Please make an appointment with your surgeon as instructed. You do not need to follow up with anesthesia unless specifically instructed to do so.  WOUND CARE INSTRUCTIONS (if applicable):  Keep a dry clean dressing on the anesthesia/puncture wound site if there is drainage.  Once the wound has quit draining you may leave it open to air.  Generally you should leave the bandage intact for twenty four hours unless there is drainage.  If the epidural site drains for more than 36-48 hours please call the anesthesia department.  QUESTIONS?:  Please feel free to call your physician or the hospital operator if you have any questions, and they will be happy to assist you.     [facility] Anesthesia Department [address] [place] [phone]    [address] MILO  02/05/2011   Your procedure is scheduled on:  02/12/2011  Report to [facility] at 845  AM.  Call this number if you have problems the morning of surgery: [phone]   Remember:   Do not eat food:After Midnight.  Do not drink clear liquids: After Midnight.  Take these medicines the morning of surgery with A SIP OF WATER: lopressor   Do not wear jewelry, make-up or nail polish.  Do not wear lotions, powders, or perfumes. You may wear deodorant.  Do not shave 48 hours prior to surgery.  Do not bring valuables to the hospital.  Contacts, dentures or bridgework may not be worn into surgery.  Leave suitcase in the car. After surgery it may be brought to your room.  For patients admitted to the hospital, checkout time is 11:00 AM  the day of discharge.   Patients discharged the day of surgery will not be allowed to drive home.  Name and phone number of your driver: friend  Special Instructions: CHG Shower Use Special Wash: 1/2 bottle night before surgery and 1/2 bottle morning of surgery.   Please read over the following fact sheets that you were given: Pain Booklet, MRSA Information, Surgical Site Infection Prevention, Anesthesia Post-op Instructions and Care and Recovery After Surgery

## 2011-02-11 NOTE — H&P (Signed)
NAMEGUNTER, CONDE NO.:  [id number]  MEDICAL RECORD NO.:  [id number]  LOCATION:  DOIB                          FACILITY:  [facility]  PHYSICIAN:  Ky Barban, M.D.DATE OF BIRTH:  July 29, 1945  DATE OF ADMISSION:  02/05/2011 DATE OF DISCHARGE:  09/27/2012LH                             HISTORY & PHYSICAL   CHIEF COMPLAINT:  Left hydrocele.  HISTORY:  A 65 year old gentleman went to the emergency room with left scrotal pain.  He was found to have large left hydrocele.  Testicular ultrasound was done, looks basically normal, no torsion.  He was referred to me.  He has a swelling which is there, hydrocele and which is for long time, not getting any worse and he says it feels heavy to walk around.  Sometime there is discomfort when he walks and has no voiding problem.  His AUA score is 4.  PAST MEDICAL HISTORY:  He does have hypertension.  No diabetes.  Never had surgery.  SOCIAL HISTORY:  He has 3 children but separated and used to smoke half pack per day.  Does not smoke any more and does not drink any alcohol. No history of illicit drug taking.  FAMILY HISTORY:  No history of prostate cancer.  REVIEW OF SYSTEMS:  Denies any chest pain, orthopnea, PND, nausea, vomiting, fever, chills.  PHYSICAL EXAMINATION:  VITAL SIGNS:  Blood pressure 166/85, temperature 97.1. CENTRAL NERVOUS SYSTEM:  No gross neurological deficit. HEAD, NECK, AND ENT:  Negative. CHEST:  Symmetrical. HEART:  Regular sinus rhythm.  No murmur. ABDOMEN:  Soft, flat.  Liver, spleen, kidneys are not palpable. EXTERNAL GENITALIA:  Uncircumcised large left hydrocele which transilluminates and right testicle feels normal.  Left is not palpable but his testicle ultrasound is normal.  RECTAL:  Normal sphincter tone. Prostate 1/2+, smooth, and firm.  IMPRESSION: 1. Left hydrocele. 2. Hypertension.  PLAN:  Hydrocelectomy under anesthesia as outpatient procedure. Limitation and complication  discussed.  I have told him it will take several weeks for the swelling to go down.  He understands and wanted me to go ahead and schedule it.     Ky Barban, M.D.     MIJ/MEDQ  D:  02/11/2011  T:  02/11/2011  Job:  [id number]

## 2011-02-12 ENCOUNTER — Ambulatory Visit (HOSPITAL_COMMUNITY): Payer: 59 | Admitting: Anesthesiology

## 2011-02-12 ENCOUNTER — Other Ambulatory Visit: Payer: Self-pay | Admitting: Urology

## 2011-02-12 ENCOUNTER — Ambulatory Visit (HOSPITAL_COMMUNITY)
Admission: RE | Admit: 2011-02-12 | Discharge: 2011-02-12 | Disposition: A | Discharge status: Home / Self Care | Payer: 59 | Source: Ambulatory Visit | Attending: Urology | Admitting: Urology

## 2011-02-12 ENCOUNTER — Encounter (HOSPITAL_COMMUNITY): Payer: Self-pay | Admitting: Anesthesiology

## 2011-02-12 ENCOUNTER — Encounter (HOSPITAL_COMMUNITY)
Admission: RE | Disposition: A | Discharge status: Home / Self Care | Payer: Self-pay | Source: Ambulatory Visit | Attending: Urology

## 2011-02-12 DIAGNOSIS — Z01810 Encounter for preprocedural cardiovascular examination: Secondary | ICD-10-CM | POA: Insufficient documentation

## 2011-02-12 DIAGNOSIS — Z720 Tobacco use: Secondary | ICD-10-CM

## 2011-02-12 DIAGNOSIS — Z01812 Encounter for preprocedural laboratory examination: Secondary | ICD-10-CM | POA: Insufficient documentation

## 2011-02-12 DIAGNOSIS — I10 Essential (primary) hypertension: Secondary | ICD-10-CM | POA: Insufficient documentation

## 2011-02-12 DIAGNOSIS — Z79899 Other long term (current) drug therapy: Secondary | ICD-10-CM | POA: Insufficient documentation

## 2011-02-12 DIAGNOSIS — Z7982 Long term (current) use of aspirin: Secondary | ICD-10-CM | POA: Insufficient documentation

## 2011-02-12 DIAGNOSIS — N433 Hydrocele, unspecified: Secondary | ICD-10-CM | POA: Insufficient documentation

## 2011-02-12 DIAGNOSIS — I160 Hypertensive urgency: Secondary | ICD-10-CM

## 2011-02-12 HISTORY — PX: HYDROCELE EXCISION: SHX482

## 2011-02-12 LAB — POCT I-STAT 4, (NA,K, GLUC, HGB,HCT)
Glucose, Bld: 102 mg/dL — ABNORMAL HIGH (ref 70–99)
Hemoglobin: 16 g/dL (ref 13.0–17.0)
Potassium: 3.3 mEq/L — ABNORMAL LOW (ref 3.5–5.1)
Sodium: 142 mEq/L (ref 135–145)

## 2011-02-12 SURGERY — HYDROCELECTOMY
Anesthesia: Spinal | Site: Scrotum | Laterality: Left | Wound class: Clean Contaminated

## 2011-02-12 MED ORDER — MIDAZOLAM HCL 2 MG/2ML IJ SOLN
INTRAMUSCULAR | Status: AC
  Filled 2011-02-12: qty 2

## 2011-02-12 MED ORDER — MIDAZOLAM HCL 2 MG/2ML IJ SOLN
1.0000 mg | INTRAMUSCULAR | Status: DC | PRN
  Administered 2011-02-12: 2 mg via INTRAVENOUS

## 2011-02-12 MED ORDER — MIDAZOLAM HCL 5 MG/5ML IJ SOLN
INTRAMUSCULAR | Status: DC | PRN
  Administered 2011-02-12: 2 mg via INTRAVENOUS

## 2011-02-12 MED ORDER — FENTANYL CITRATE 0.05 MG/ML IJ SOLN
INTRAMUSCULAR | Status: AC
  Filled 2011-02-12: qty 5

## 2011-02-12 MED ORDER — PROPOFOL 10 MG/ML IV EMUL
INTRAVENOUS | Status: DC | PRN
  Administered 2011-02-12: 25 ug/kg/min via INTRAVENOUS

## 2011-02-12 MED ORDER — LACTATED RINGERS IV SOLN
INTRAVENOUS | Status: DC
  Administered 2011-02-12: 10:00:00 via INTRAVENOUS

## 2011-02-12 MED ORDER — FENTANYL CITRATE 0.05 MG/ML IJ SOLN: 25.0000 ug | INTRAMUSCULAR | Status: DC | PRN

## 2011-02-12 MED ORDER — BUPIVACAINE HCL (PF) 0.5 % IJ SOLN
INTRAMUSCULAR | Status: DC | PRN
  Administered 2011-02-12: 10 mL

## 2011-02-12 MED ORDER — BUPIVACAINE HCL (PF) 0.5 % IJ SOLN
INTRAMUSCULAR | Status: AC
  Filled 2011-02-12: qty 30

## 2011-02-12 MED ORDER — ONDANSETRON HCL 4 MG/2ML IJ SOLN: 4.0000 mg | Freq: Once | INTRAMUSCULAR | Status: AC | PRN

## 2011-02-12 MED ORDER — FENTANYL CITRATE 0.05 MG/ML IJ SOLN
INTRAMUSCULAR | Status: DC | PRN
  Administered 2011-02-12 (×3): 50 ug via INTRAVENOUS

## 2011-02-12 MED ORDER — MIDAZOLAM HCL 2 MG/2ML IJ SOLN: 1.0000 mg | INTRAMUSCULAR | Status: DC | PRN

## 2011-02-12 MED ORDER — BUPIVACAINE HCL 0.75 % IJ SOLN
INTRAMUSCULAR | Status: DC | PRN
  Administered 2011-02-12: 12 mg via INTRATHECAL

## 2011-02-12 MED ORDER — FENTANYL CITRATE 0.05 MG/ML IJ SOLN
INTRAMUSCULAR | Status: DC | PRN
  Administered 2011-02-12: 25 ug via INTRATHECAL

## 2011-02-12 MED ORDER — BACITRACIN ZINC 500 UNIT/GM EX OINT
TOPICAL_OINTMENT | CUTANEOUS | Status: DC | PRN
  Administered 2011-02-12: 1 via TOPICAL

## 2011-02-12 MED ORDER — BACITRACIN ZINC 500 UNIT/GM EX OINT
TOPICAL_OINTMENT | CUTANEOUS | Status: AC
  Filled 2011-02-12: qty 0.9

## 2011-02-12 MED ORDER — SODIUM CHLORIDE 0.9 % IR SOLN
Status: DC | PRN
  Administered 2011-02-12: 1000 mL

## 2011-02-12 MED ORDER — BUPIVACAINE IN DEXTROSE 0.75-8.25 % IT SOLN
INTRATHECAL | Status: AC
  Filled 2011-02-12: qty 2

## 2011-02-12 SURGICAL SUPPLY — 35 items
CLOTH BEACON ORANGE TIMEOUT ST (SAFETY) ×2 IMPLANT
COVER LIGHT HANDLE STERIS (MISCELLANEOUS) ×4 IMPLANT
ELECT REM PT RETURN 9FT ADLT (ELECTROSURGICAL) ×2
ELECTRODE REM PT RTRN 9FT ADLT (ELECTROSURGICAL) ×1 IMPLANT
FORMALIN 10 PREFIL 480ML (MISCELLANEOUS) IMPLANT
GLOVE BIO SURGEON STRL SZ7 (GLOVE) ×2 IMPLANT
GLOVE ECLIPSE 6.5 STRL STRAW (GLOVE) ×2 IMPLANT
GLOVE EXAM NITRILE LRG STRL (GLOVE) ×2 IMPLANT
GLOVE INDICATOR 7.5 STRL GRN (GLOVE) ×2 IMPLANT
GOWN BRE IMP SLV AUR XL STRL (GOWN DISPOSABLE) ×4 IMPLANT
KIT BLADEGUARD II DBL (SET/KITS/TRAYS/PACK) IMPLANT
KIT ROOM TURNOVER AP CYSTO (KITS) ×2 IMPLANT
MANIFOLD NEPTUNE II (INSTRUMENTS) ×2 IMPLANT
NEEDLE HYPO 21X1.5 SAFETY (NEEDLE) ×4 IMPLANT
NS IRRIG 1000ML POUR BTL (IV SOLUTION) ×2 IMPLANT
PACK MINOR (CUSTOM PROCEDURE TRAY) ×2 IMPLANT
PAD ARMBOARD 7.5X6 YLW CONV (MISCELLANEOUS) ×2 IMPLANT
SET BASIN LINEN APH (SET/KITS/TRAYS/PACK) ×2 IMPLANT
SOL PREP PROV IODINE SCRUB 4OZ (MISCELLANEOUS) ×2 IMPLANT
SPONGE GAUZE 4X4 12PLY (GAUZE/BANDAGES/DRESSINGS) ×2 IMPLANT
SPONGE INTESTINAL PEANUT (DISPOSABLE) ×2 IMPLANT
SPONGE LAP 18X18 X RAY DECT (DISPOSABLE) ×4 IMPLANT
SUPPORT SCROTAL LRG NO STRP (SOFTGOODS) ×2 IMPLANT
SUPPORT SCROTAL MEDIUM (SOFTGOODS) IMPLANT
SUPPORT SCROTAL XLRG NO STRP (MISCELLANEOUS) IMPLANT
SUT CHROMIC 3 0 SH 27 (SUTURE) ×4 IMPLANT
SUT CHROMIC BN 1/2CIR 4-0 27IN (SUTURE) ×2 IMPLANT
SUT VIC AB 2-0 CT1 27 (SUTURE) ×1
SUT VIC AB 2-0 CT1 TAPERPNT 27 (SUTURE) ×1 IMPLANT
SUT VICRYL AB 3 0 TIES (SUTURE) ×2 IMPLANT
SUT VICRYL AB 3-0 BRD CT 36IN (SUTURE) ×6 IMPLANT
SYR 30ML LL (SYRINGE) ×2 IMPLANT
SYR CONTROL 10ML LL (SYRINGE) ×2 IMPLANT
TAPE UMBILICAL COTTON 1/8X30 (MISCELLANEOUS) IMPLANT
YANKAUER SUCT 12FT TUBE ARGYLE (SUCTIONS) ×2 IMPLANT

## 2011-02-12 NOTE — Anesthesia Preprocedure Evaluation (Addendum)
Anesthesia Evaluation  Name, MR# and DOB Patient awake  General Assessment Comment  Reviewed: Allergy & Precautions, H&P , NPO status , Patient's Chart, lab work & pertinent test results  Airway Mallampati: II      Dental  (+) Edentulous Upper   Pulmonary Current Smoker  clear to auscultation        Cardiovascular hypertension (hx poor control, EKG= LVHw/ strain), Pt. on medications Regular Bradycardia    Neuro/Psych    GI/Hepatic negative GI ROS   Endo/Other    Renal/GU      Musculoskeletal   Abdominal   Peds  Hematology   Anesthesia Other Findings   Reproductive/Obstetrics                          Anesthesia Physical Anesthesia Plan  ASA: III  Anesthesia Plan: Spinal   Post-op Pain Management:    Induction:   Airway Management Planned: Nasal Cannula  Additional Equipment:   Intra-op Plan:   Post-operative Plan:   Informed Consent: I have reviewed the patients History and Physical, chart, labs and discussed the procedure including the risks, benefits and alternatives for the proposed anesthesia with the patient or authorized representative who has indicated his/her understanding and acceptance.     Plan Discussed with:   Anesthesia Plan Comments:         Anesthesia Quick Evaluation

## 2011-02-12 NOTE — Anesthesia Postprocedure Evaluation (Signed)
  Anesthesia Post-op Note  Patient: Gabriel Poole  Procedure(s) Performed:  HYDROCELECTOMY ADULT  Patient Location: PACU  Anesthesia Type: Spinal  Level of Consciousness: awake, alert  and oriented  Airway and Oxygen Therapy: Patient Spontanous Breathing and Patient connected to face mask oxygen  Post-op Pain: none  Post-op Assessment: Post-op Vital signs reviewed, Patient's Cardiovascular Status Stable and Respiratory Function Stable  Post-op Vital Signs: Reviewed and stable    MOVING LOWER EXTREMITIES  Complications: No apparent anesthesia complications

## 2011-02-12 NOTE — Brief Op Note (Signed)
02/12/2011  11:23 AM  PATIENT:  Gabriel Poole  65 y.o. male  PRE-OPERATIVE DIAGNOSIS:  left hydrocele  POST-OPERATIVE DIAGNOSIS:  left hydrocele  PROCEDURE:  Procedure(s): HYDROCELECTOMY ADULT  SURGEON:  Surgeon(s): Ky Barban  PHYSICIAN ASSISTANT:   ASSISTANTS: none   ANESTHESIA:   spinal  OR FLUID I/O:  Total I/O In: 600 [I.V.:600] Out: 500 [Other:500]  BLOOD ADMINISTERED:none  DRAINS: none   LOCAL MEDICATIONS USED:  [id number]                                       5      5  SPECIMEN:  Source of Specimen:  hydrocele sac and appendix testicle  DISPOSITION OF SPECIMEN:  PATHOLOGY  COUNTS:  YES  TOURNIQUET:  * No tourniquets in log *  DICTATION: .Other Dictation: Dictation Number [id number] [id number]  PLAN OF CARE: Discharge to home after PACU  PATIENT DISPOSITION:  PACU - hemodynamically stable.   Delay start of Pharmacological VTE agent (>24hrs) due to surgical blood loss or risk of bleeding:  yes

## 2011-02-12 NOTE — Anesthesia Procedure Notes (Addendum)
Spinal Block  Patient location during procedure: OR Start time: 02/12/2011 10:05 AM Staffing CRNA/Resident: Marylene Buerger Performed by: resident/CRNA  Spinal Block Patient position: left lateral decubitus Prep: Betadine  Spinal Block  End time: 02/12/2011 10:10 AM Spinal Block Patient position: left lateral decubitus Prep: Betadine Approach: midline Location: L3-4 Injection technique: single-shot Needle Needle type: Sprotte  Needle gauge: 24 G Needle length: 10 cm Assessment Sensory level: T4 Additional Notes [id number] 2013-08 Marcaine 12 Mg Fentanyl  25  mcg

## 2011-02-12 NOTE — Transfer of Care (Signed)
Immediate Anesthesia Transfer of Care Note  Patient: Gabriel Poole  Procedure(s) Performed:  HYDROCELECTOMY ADULT  Patient Location: PACU  Anesthesia Type: Spinal  Level of Consciousness: awake, alert  and oriented  Airway & Oxygen Therapy: Patient Spontanous Breathing and Patient connected to face mask oxygen  Post-op Assessment: Report given to PACU RN  Post vital signs: Reviewed  Complications: No apparent anesthesia complications

## 2011-02-12 NOTE — Progress Notes (Signed)
Patient is examined and no change in h& p.

## 2011-02-12 NOTE — Op Note (Signed)
NAMECASTIEL, LAURICELLA NO.:  [id number]  MEDICAL RECORD NO.:  [id number]  LOCATION:  APPO                          FACILITY:  [facility]  PHYSICIAN:  Ky Barban, M.D.DATE OF BIRTH:  [birth year]  DATE OF PROCEDURE:  02/12/2011 DATE OF DISCHARGE:                              OPERATIVE REPORT   PREOPERATIVE DIAGNOSIS:  Large left hydrocele.  POSTOPERATIVE DIAGNOSIS:  Large left hydrocele.  PROCEDURE:  Left hydrocelectomy.  SURGEON:  Ky Barban, MD  ANESTHESIA:  Spinal.  DESCRIPTION OF PROCEDURE:  The patient under spinal anesthesia in supine position, after usual prep and drape using #20 needle, I aspirated the hydrocele sac to confirm the diagnosis.  Amber-colored fluid came out. Then I made an incision about 1.5 inch long across the mid scrotum.  The fascial layers were divided with the help of the cautery.  Hydrocele sac was opened.  About 400 mL of fluid, amber-colored, was removed.  The testicle was delivered through the incision.  Grossly, the testicle looks fine.  There were several areas around the testicle very like encysted hydrocele.  They were opened up with the help of the cautery. Then the margins of the hydrocele sac were imbricated with a continuous stitch of 3-0 Vicryl.  The hydrocele sac which was around the spermatic cord was very thin.  It was simply removed with blunt dissection to make sure there was no bleeding.  We then proceeded to the imbrication of the margins of the hydrocele sac using 3-0 Vicryl, continuous stitch, locking fashion and hydrocele sac was imbricated along the side of the testicles on both sides.  The appendix testicle was simply removed with the help of the cautery.  After making sure there was complete hemostasis, the testicle was placed in the scrotal sac and the fascial layers were closed with continuous stitch of 3-0 Vicryl.  The skin was closed with 3-0 chromic horizontal mattress stitches.  At the end,  the wound was infiltrated with 10 mL of 1% Marcaine and Neosporin ointment applied to the incision and sterile dressing applied.  The patient left the operating room in satisfactory condition.     Ky Barban, M.D.    MIJ/MEDQ  D:  02/12/2011  T:  02/12/2011  Job:  [id number]

## 2011-02-12 NOTE — Progress Notes (Signed)
Awake. Sensation to bilateral toes. Moves lower extremities without difficulty. Rates pain 0.

## 2011-02-12 NOTE — Progress Notes (Signed)
No change in  H& P

## 2011-02-17 ENCOUNTER — Encounter (HOSPITAL_COMMUNITY): Payer: Self-pay | Admitting: Urology

## 2012-08-04 ENCOUNTER — Encounter: Payer: Self-pay | Admitting: *Deleted

## 2012-09-21 ENCOUNTER — Encounter: Payer: Self-pay | Admitting: Family Medicine

## 2012-09-21 ENCOUNTER — Ambulatory Visit (INDEPENDENT_AMBULATORY_CARE_PROVIDER_SITE_OTHER): Payer: Medicare HMO | Admitting: Family Medicine

## 2012-09-21 VITALS — BP 155/90 | HR 59 | Temp 97.0°F | Wt 197.2 lb

## 2012-09-21 DIAGNOSIS — F17200 Nicotine dependence, unspecified, uncomplicated: Secondary | ICD-10-CM | POA: Insufficient documentation

## 2012-09-21 DIAGNOSIS — E785 Hyperlipidemia, unspecified: Secondary | ICD-10-CM | POA: Insufficient documentation

## 2012-09-21 DIAGNOSIS — Z9119 Patient's noncompliance with other medical treatment and regimen: Secondary | ICD-10-CM

## 2012-09-21 DIAGNOSIS — Z91199 Patient's noncompliance with other medical treatment and regimen due to unspecified reason: Secondary | ICD-10-CM | POA: Insufficient documentation

## 2012-09-21 DIAGNOSIS — M109 Gout, unspecified: Secondary | ICD-10-CM | POA: Insufficient documentation

## 2012-09-21 DIAGNOSIS — I10 Essential (primary) hypertension: Secondary | ICD-10-CM | POA: Insufficient documentation

## 2012-09-21 LAB — COMPLETE METABOLIC PANEL WITH GFR
ALT: 20 U/L (ref 0–53)
AST: 20 U/L (ref 0–37)
Albumin: 4.4 g/dL (ref 3.5–5.2)
Alkaline Phosphatase: 102 U/L (ref 39–117)
BUN: 10 mg/dL (ref 6–23)
CO2: 27 mEq/L (ref 19–32)
Calcium: 9.5 mg/dL (ref 8.4–10.5)
Chloride: 108 mEq/L (ref 96–112)
Creat: 1.11 mg/dL (ref 0.50–1.35)
GFR, Est African American: 80 mL/min
GFR, Est Non African American: 69 mL/min
Glucose, Bld: 72 mg/dL (ref 70–99)
Potassium: 3.8 mEq/L (ref 3.5–5.3)
Sodium: 143 mEq/L (ref 135–145)
Total Bilirubin: 0.7 mg/dL (ref 0.3–1.2)
Total Protein: 6.8 g/dL (ref 6.0–8.3)

## 2012-09-21 LAB — URIC ACID: Uric Acid, Serum: 4.4 mg/dL (ref 4.0–6.0)

## 2012-09-21 MED ORDER — INDOMETHACIN 50 MG PO CAPS: 50.0000 mg | ORAL_CAPSULE | Freq: Three times a day (TID) | ORAL | Status: DC

## 2012-09-21 MED ORDER — ATORVASTATIN CALCIUM 40 MG PO TABS: 40.0000 mg | ORAL_TABLET | Freq: Every day | ORAL | Status: DC

## 2012-09-21 MED ORDER — METOPROLOL TARTRATE 50 MG PO TABS: 50.0000 mg | ORAL_TABLET | Freq: Two times a day (BID) | ORAL | Status: DC

## 2012-09-21 MED ORDER — HYDROCODONE-ACETAMINOPHEN 5-325 MG PO TABS: 1.0000 | ORAL_TABLET | Freq: Four times a day (QID) | ORAL | Status: DC | PRN

## 2012-09-21 MED ORDER — ALLOPURINOL 300 MG PO TABS: 300.0000 mg | ORAL_TABLET | Freq: Every day | ORAL | Status: DC

## 2012-09-21 MED ORDER — AMLODIPINE BESYLATE 10 MG PO TABS: 10.0000 mg | ORAL_TABLET | Freq: Every day | ORAL | Status: DC

## 2012-09-21 NOTE — Patient Instructions (Addendum)
Smoking Cessation Quitting smoking is important to your health and has many advantages. However, it is not always easy to quit since nicotine is a very addictive drug. Often times, people try 3 times or more before being able to quit. This document explains the best ways for you to prepare to quit smoking. Quitting takes hard work and a lot of effort, but you can do it. ADVANTAGES OF QUITTING SMOKING  You will live longer, feel better, and live better.  Your body will feel the impact of quitting smoking almost immediately.  Within 20 minutes, blood pressure decreases. Your pulse returns to its normal level.  After 8 hours, carbon monoxide levels in the blood return to normal. Your oxygen level increases.  After 24 hours, the chance of having a heart attack starts to decrease. Your breath, hair, and body stop smelling like smoke.  After 48 hours, damaged nerve endings begin to recover. Your sense of taste and smell improve.  After 72 hours, the body is virtually free of nicotine. Your bronchial tubes relax and breathing becomes easier.  After 2 to 12 weeks, lungs can hold more air. Exercise becomes easier and circulation improves.  The risk of having a heart attack, stroke, cancer, or lung disease is greatly reduced.  After 1 year, the risk of coronary heart disease is cut in half.  After 5 years, the risk of stroke falls to the same as a nonsmoker.  After 10 years, the risk of lung cancer is cut in half and the risk of other cancers decreases significantly.  After 15 years, the risk of coronary heart disease drops, usually to the level of a nonsmoker.  If you are pregnant, quitting smoking will improve your chances of having a healthy baby.  The people you live with, especially any children, will be healthier.  You will have extra money to spend on things other than cigarettes. QUESTIONS TO THINK ABOUT BEFORE ATTEMPTING TO QUIT You may want to talk about your answers with your  caregiver.  Why do you want to quit?  If you tried to quit in the past, what helped and what did not?  What will be the most difficult situations for you after you quit? How will you plan to handle them?  Who can help you through the tough times? Your family? Friends? A caregiver?  What pleasures do you get from smoking? What ways can you still get pleasure if you quit? Here are some questions to ask your caregiver:  How can you help me to be successful at quitting?  What medicine do you think would be best for me and how should I take it?  What should I do if I need more help?  What is smoking withdrawal like? How can I get information on withdrawal? GET READY  Set a quit date.  Change your environment by getting rid of all cigarettes, ashtrays, matches, and lighters in your home, car, or work. Do not let people smoke in your home.  Review your past attempts to quit. Think about what worked and what did not. GET SUPPORT AND ENCOURAGEMENT You have a better chance of being successful if you have help. You can get support in many ways.  Tell your family, friends, and co-workers that you are going to quit and need their support. Ask them not to smoke around you.  Get individual, group, or telephone counseling and support. Programs are available at local hospitals and health centers. Call your local health department for   information about programs in your area.  Spiritual beliefs and practices may help some smokers quit.  Download a "quit meter" on your computer to keep track of quit statistics, such as how long you have gone without smoking, cigarettes not smoked, and money saved.  Get a self-help book about quitting smoking and staying off of tobacco. LEARN NEW SKILLS AND BEHAVIORS  Distract yourself from urges to smoke. Talk to someone, go for a walk, or occupy your time with a task.  Change your normal routine. Take a different route to work. Drink tea instead of coffee.  Eat breakfast in a different place.  Reduce your stress. Take a hot bath, exercise, or read a book.  Plan something enjoyable to do every day. Reward yourself for not smoking.  Explore interactive web-based programs that specialize in helping you quit. GET MEDICINE AND USE IT CORRECTLY Medicines can help you stop smoking and decrease the urge to smoke. Combining medicine with the above behavioral methods and support can greatly increase your chances of successfully quitting smoking.  Nicotine replacement therapy helps deliver nicotine to your body without the negative effects and risks of smoking. Nicotine replacement therapy includes nicotine gum, lozenges, inhalers, nasal sprays, and skin patches. Some may be available over-the-counter and others require a prescription.  Antidepressant medicine helps people abstain from smoking, but how this works is unknown. This medicine is available by prescription.  Nicotinic receptor partial agonist medicine simulates the effect of nicotine in your brain. This medicine is available by prescription. Ask your caregiver for advice about which medicines to use and how to use them based on your health history. Your caregiver will tell you what side effects to look out for if you choose to be on a medicine or therapy. Carefully read the information on the package. Do not use any other product containing nicotine while using a nicotine replacement product.  RELAPSE OR DIFFICULT SITUATIONS Most relapses occur within the first 3 months after quitting. Do not be discouraged if you start smoking again. Remember, most people try several times before finally quitting. You may have symptoms of withdrawal because your body is used to nicotine. You may crave cigarettes, be irritable, feel very hungry, cough often, get headaches, or have difficulty concentrating. The withdrawal symptoms are only temporary. They are strongest when you first quit, but they will go away within  10 14 days. To reduce the chances of relapse, try to:  Avoid drinking alcohol. Drinking lowers your chances of successfully quitting.  Reduce the amount of caffeine you consume. Once you quit smoking, the amount of caffeine in your body increases and can give you symptoms, such as a rapid heartbeat, sweating, and anxiety.  Avoid smokers because they can make you want to smoke.  Do not let weight gain distract you. Many smokers will gain weight when they quit, usually less than 10 pounds. Eat a healthy diet and stay active. You can always lose the weight gained after you quit.  Find ways to improve your mood other than smoking. FOR MORE INFORMATION  www.smokefree.gov  Document Released: 04/21/2001 Document Revised: 10/27/2011 Document Reviewed: 08/06/2011 [employer] Patient Information 2013 [place], [place].  Gout Gout is an inflammatory condition (arthritis) caused by a buildup of uric acid crystals in the joints. Uric acid is a chemical that is normally present in the blood. Under some circumstances, uric acid can form into crystals in your joints. This causes joint redness, soreness, and swelling (inflammation). Repeat attacks are common. Over time, uric  acid crystals can form into masses (tophi) near a joint, causing disfigurement. Gout is treatable and often preventable. CAUSES  The disease begins with elevated levels of uric acid in the blood. Uric acid is produced by your body when it breaks down a naturally found substance called purines. This also happens when you eat certain foods such as meats and fish. Causes of an elevated uric acid level include:  Being passed down from parent to child (heredity).  Diseases that cause increased uric acid production (obesity, psoriasis, some cancers).  Excessive alcohol use.  Diet, especially diets rich in meat and seafood.  Medicines, including certain cancer-fighting drugs (chemotherapy), diuretics, and aspirin.  Chronic kidney disease. The  kidneys are no longer able to remove uric acid well.  Problems with metabolism. Conditions strongly associated with gout include:  Obesity.  High blood pressure.  High cholesterol.  Diabetes. Not everyone with elevated uric acid levels gets gout. It is not understood why some people get gout and others do not. Surgery, joint injury, and eating too much of certain foods are some of the factors that can lead to gout. SYMPTOMS   An attack of gout comes on quickly. It causes intense pain with redness, swelling, and warmth in a joint.  Fever can occur.  Often, only one joint is involved. Certain joints are more commonly involved:  Base of the big toe.  Knee.  Ankle.  Wrist.  Finger. Without treatment, an attack usually goes away in a few days to weeks. Between attacks, you usually will not have symptoms, which is different from many other forms of arthritis. DIAGNOSIS  Your caregiver will suspect gout based on your symptoms and exam. Removal of fluid from the joint (arthrocentesis) is done to check for uric acid crystals. Your caregiver will give you a medicine that numbs the area (local anesthetic) and use a needle to remove joint fluid for exam. Gout is confirmed when uric acid crystals are seen in joint fluid, using a special microscope. Sometimes, blood, urine, and X-ray tests are also used. TREATMENT  There are 2 phases to gout treatment: treating the sudden onset (acute) attack and preventing attacks (prophylaxis). Treatment of an Acute Attack  Medicines are used. These include anti-inflammatory medicines or steroid medicines.  An injection of steroid medicine into the affected joint is sometimes necessary.  The painful joint is rested. Movement can worsen the arthritis.  You may use warm or cold treatments on painful joints, depending which works best for you.  Discuss the use of coffee, vitamin C, or cherries with your caregiver. These may be helpful treatment  options. Treatment to Prevent Attacks After the acute attack subsides, your caregiver may advise prophylactic medicine. These medicines either help your kidneys eliminate uric acid from your body or decrease your uric acid production. You may need to stay on these medicines for a very long time. The early phase of treatment with prophylactic medicine can be associated with an increase in acute gout attacks. For this reason, during the first few months of treatment, your caregiver may also advise you to take medicines usually used for acute gout treatment. Be sure you understand your caregiver's directions. You should also discuss dietary treatment with your caregiver. Certain foods such as meats and fish can increase uric acid levels. Other foods such as dairy can decrease levels. Your caregiver can give you a list of foods to avoid. HOME CARE INSTRUCTIONS   Do not take aspirin to relieve pain. This raises uric acid  levels.  Only take over-the-counter or prescription medicines for pain, discomfort, or fever as directed by your caregiver.  Rest the joint as much as possible. When in bed, keep sheets and blankets off painful areas.  Keep the affected joint raised (elevated).  Use crutches if the painful joint is in your leg.  Drink enough water and fluids to keep your urine clear or pale yellow. This helps your body get rid of uric acid. Do not drink alcoholic beverages. They slow the passage of uric acid.  Follow your caregiver's dietary instructions. Pay careful attention to the amount of protein you eat. Your daily diet should emphasize fruits, vegetables, whole grains, and fat-free or low-fat milk products.  Maintain a healthy body weight. SEEK MEDICAL CARE IF:   You have an oral temperature above 102 F (38.9 C).  You develop diarrhea, vomiting, or any side effects from medicines.  You do not feel better in 24 hours, or you are getting worse. SEEK IMMEDIATE MEDICAL CARE IF:   Your  joint becomes suddenly more tender and you have:  Chills.  An oral temperature above 102 F (38.9 C), not controlled by medicine. MAKE SURE YOU:   Understand these instructions.  Will watch your condition.  Will get help right away if you are not doing well or get worse. Document Released: 04/24/2000 Document Revised: 07/20/2011 Document Reviewed: 08/05/2009 [facility] Patient Information 2013 [place], [place].  Hypertriglyceridemia  Diet for High blood levels of Triglycerides Most fats in food are triglycerides. Triglycerides in your blood are stored as fat in your body. High levels of triglycerides in your blood may put you at a greater risk for heart disease and stroke.  Normal triglyceride levels are less than 150 mg/dL. Borderline high levels are 150-199 mg/dl. High levels are 200 - 499 mg/dL, and very high triglyceride levels are greater than 500 mg/dL. The decision to treat high triglycerides is generally based on the level. For people with borderline or high triglyceride levels, treatment includes weight loss and exercise. Drugs are recommended for people with very high triglyceride levels. Many people who need treatment for high triglyceride levels have metabolic syndrome. This syndrome is a collection of disorders that often include: insulin resistance, high blood pressure, blood clotting problems, high cholesterol and triglycerides. TESTING PROCEDURE FOR TRIGLYCERIDES  You should not eat 4 hours before getting your triglycerides measured. The normal range of triglycerides is between 10 and 250 milligrams per deciliter (mg/dl). Some people may have extreme levels (1000 or above), but your triglyceride level may be too high if it is above 150 mg/dl, depending on what other risk factors you have for heart disease.  People with high blood triglycerides may also have high blood cholesterol levels. If you have high blood cholesterol as well as high blood triglycerides, your risk for heart  disease is probably greater than if you only had high triglycerides. High blood cholesterol is one of the main risk factors for heart disease. CHANGING YOUR DIET  Your weight can affect your blood triglyceride level. If you are more than 20% above your ideal body weight, you may be able to lower your blood triglycerides by losing weight. Eating less and exercising regularly is the best way to combat this. Fat provides more calories than any other food. The best way to lose weight is to eat less fat. Only 30% of your total calories should come from fat. Less than 7% of your diet should come from saturated fat. A diet low in fat  and saturated fat is the same as a diet to decrease blood cholesterol. By eating a diet lower in fat, you may lose weight, lower your blood cholesterol, and lower your blood triglyceride level.  Eating a diet low in fat, especially saturated fat, may also help you lower your blood triglyceride level. Ask your dietitian to help you figure how much fat you can eat based on the number of calories your caregiver has prescribed for you.  Exercise, in addition to helping with weight loss may also help lower triglyceride levels.   Alcohol can increase blood triglycerides. You may need to stop drinking alcoholic beverages.  Too much carbohydrate in your diet may also increase your blood triglycerides. Some complex carbohydrates are necessary in your diet. These may include bread, rice, potatoes, other starchy vegetables and cereals.  Reduce "simple" carbohydrates. These may include pure sugars, candy, honey, and jelly without losing other nutrients. If you have the kind of high blood triglycerides that is affected by the amount of carbohydrates in your diet, you will need to eat less sugar and less high-sugar foods. Your caregiver can help you with this.  Adding 2-4 grams of fish oil (EPA+ DHA) may also help lower triglycerides. Speak with your caregiver before adding any supplements to  your regimen. Following the Diet  Maintain your ideal weight. Your caregivers can help you with a diet. Generally, eating less food and getting more exercise will help you lose weight. Joining a weight control group may also help. Ask your caregivers for a good weight control group in your area.  Eat low-fat foods instead of high-fat foods. This can help you lose weight too.  These foods are lower in fat. Eat MORE of these:   Dried beans, peas, and lentils.  Egg whites.  Low-fat cottage cheese.  Fish.  Lean cuts of meat, such as round, sirloin, rump, and flank (cut extra fat off meat you fix).  Whole grain breads, cereals and pasta.  Skim and nonfat dry milk.  Low-fat yogurt.  Poultry without the skin.  Cheese made with skim or part-skim milk, such as mozzarella, parmesan, farmers', ricotta, or pot cheese. These are higher fat foods. Eat LESS of these:   Whole milk and foods made from whole milk, such as American, blue, cheddar, monterey jack, and swiss cheese  High-fat meats, such as luncheon meats, sausages, knockwurst, bratwurst, hot dogs, ribs, corned beef, ground pork, and regular ground beef.  Fried foods. Limit saturated fats in your diet. Substituting unsaturated fat for saturated fat may decrease your blood triglyceride level. You will need to read package labels to know which products contain saturated fats.  These foods are high in saturated fat. Eat LESS of these:   Fried pork skins.  Whole milk.  Skin and fat from poultry.  Palm oil.  Butter.  Shortening.  Cream cheese.  Tomasa Blase.  Margarines and baked goods made from listed oils.  Vegetable shortenings.  Chitterlings.  Fat from meats.  Coconut oil.  Palm kernel oil.  Lard.  Cream.  Sour cream.  Fatback.  Coffee whiteners and non-dairy creamers made with these oils.  Cheese made from whole milk. Use unsaturated fats (both polyunsaturated and monounsaturated) moderately. Remember,  even though unsaturated fats are better than saturated fats; you still want a diet low in total fat.  These foods are high in unsaturated fat:   Canola oil.  Sunflower oil.  Mayonnaise.  Almonds.  Peanuts.  Pine nuts.  Margarines made with these oils.  Safflower oil.  Olive oil.  Avocados.  Cashews.  Peanut butter.  Sunflower seeds.  Soybean oil.  Peanut oil.  Olives.  Pecans.  Walnuts.  Pumpkin seeds. Avoid sugar and other high-sugar foods. This will decrease carbohydrates without decreasing other nutrients. Sugar in your food goes rapidly to your blood. When there is excess sugar in your blood, your liver may use it to make more triglycerides. Sugar also contains calories without other important nutrients.  Eat LESS of these:   Sugar, brown sugar, powdered sugar, jam, jelly, preserves, honey, syrup, molasses, pies, candy, cakes, cookies, frosting, pastries, colas, soft drinks, punches, fruit drinks, and regular gelatin.  Avoid alcohol. Alcohol, even more than sugar, may increase blood triglycerides. In addition, alcohol is high in calories and low in nutrients. Ask for sparkling water, or a diet soft drink instead of an alcoholic beverage. Suggestions for planning and preparing meals   Bake, broil, grill or roast meats instead of frying.  Remove fat from meats and skin from poultry before cooking.  Add spices, herbs, lemon juice or vinegar to vegetables instead of salt, rich sauces or gravies.  Use a non-stick skillet without fat or use no-stick sprays.  Cool and refrigerate stews and broth. Then remove the hardened fat floating on the surface before serving.  Refrigerate meat drippings and skim off fat to make low-fat gravies.  Serve more fish.  Use less butter, margarine and other high-fat spreads on bread or vegetables.  Use skim or reconstituted non-fat dry milk for cooking.  Cook with low-fat cheeses.  Substitute low-fat yogurt or cottage  cheese for all or part of the sour cream in recipes for sauces, dips or congealed salads.  Use half yogurt/half mayonnaise in salad recipes.  Substitute evaporated skim milk for cream. Evaporated skim milk or reconstituted non-fat dry milk can be whipped and substituted for whipped cream in certain recipes.  Choose fresh fruits for dessert instead of high-fat foods such as pies or cakes. Fruits are naturally low in fat. When Dining Out   Order low-fat appetizers such as fruit or vegetable juice, pasta with vegetables or tomato sauce.  Select clear, rather than cream soups.  Ask that dressings and gravies be served on the side. Then use less of them.  Order foods that are baked, broiled, poached, steamed, stir-fried, or roasted.  Ask for margarine instead of butter, and use only a small amount.  Drink sparkling water, unsweetened tea or coffee, or diet soft drinks instead of alcohol or other sweet beverages. QUESTIONS AND ANSWERS ABOUT OTHER FATS IN THE BLOOD: SATURATED FAT, TRANS FAT, AND CHOLESTEROL What is trans fat? Trans fat is a type of fat that is formed when vegetable oil is hardened through a process called hydrogenation. This process helps makes foods more solid, gives them shape, and prolongs their shelf life. Trans fats are also called hydrogenated or partially hydrogenated oils.  What do saturated fat, trans fat, and cholesterol in foods have to do with heart disease? Saturated fat, trans fat, and cholesterol in the diet all raise the level of LDL "bad" cholesterol in the blood. The higher the LDL cholesterol, the greater the risk for coronary heart disease (CHD). Saturated fat and trans fat raise LDL similarly.  What foods contain saturated fat, trans fat, and cholesterol? High amounts of saturated fat are found in animal products, such as fatty cuts of meat, chicken skin, and full-fat dairy products like butter, whole milk, cream, and cheese, and in tropical vegetable oils  such as palm, palm kernel, and coconut oil. Trans fat is found in some of the same foods as saturated fat, such as vegetable shortening, some margarines (especially hard or stick margarine), crackers, cookies, baked goods, fried foods, salad dressings, and other processed foods made with partially hydrogenated vegetable oils. Small amounts of trans fat also occur naturally in some animal products, such as milk products, beef, and lamb. Foods high in cholesterol include liver, other organ meats, egg yolks, shrimp, and full-fat dairy products. How can I use the new food label to make heart-healthy food choices? Check the Nutrition Facts panel of the food label. Choose foods lower in saturated fat, trans fat, and cholesterol. For saturated fat and cholesterol, you can also use the Percent Daily Value (%DV): 5% DV or less is low, and 20% DV or more is high. (There is no %DV for trans fat.) Use the Nutrition Facts panel to choose foods low in saturated fat and cholesterol, and if the trans fat is not listed, read the ingredients and limit products that list shortening or hydrogenated or partially hydrogenated vegetable oil, which tend to be high in trans fat. POINTS TO REMEMBER:   Discuss your risk for heart disease with your caregivers, and take steps to reduce risk factors.  Change your diet. Choose foods that are low in saturated fat, trans fat, and cholesterol.  Add exercise to your daily routine if it is not already being done. Participate in physical activity of moderate intensity, like brisk walking, for at least 30 minutes on most, and preferably all days of the week. No time? Break the 30 minutes into three, 10-minute segments during the day.  Stop smoking. If you do smoke, contact your caregiver to discuss ways in which they can help you quit.  Do not use street drugs.  Maintain a normal weight.  Maintain a healthy blood pressure.  Keep up with your blood work for checking the fats in your  blood as directed by your caregiver. Document Released: 02/13/2004 Document Revised: 10/27/2011 Document Reviewed: 09/10/2008 [facility] Patient Information 2013 [place], [place].  Hypertension As your heart beats, it forces blood through your arteries. This force is your blood pressure. If the pressure is too high, it is called hypertension (HTN) or high blood pressure. HTN is dangerous because you may have it and not know it. High blood pressure may mean that your heart has to work harder to pump blood. Your arteries may be narrow or stiff. The extra work puts you at risk for heart disease, stroke, and other problems.  Blood pressure consists of two numbers, a higher number over a lower, 110/72, for example. It is stated as "110 over 72." The ideal is below 120 for the top number (systolic) and under 80 for the bottom (diastolic). Write down your blood pressure today. You should pay close attention to your blood pressure if you have certain conditions such as:  Heart failure.  Prior heart attack.  Diabetes  Chronic kidney disease.  Prior stroke.  Multiple risk factors for heart disease. To see if you have HTN, your blood pressure should be measured while you are seated with your arm held at the level of the heart. It should be measured at least twice. A one-time elevated blood pressure reading (especially in the Emergency Department) does not mean that you need treatment. There may be conditions in which the blood pressure is different between your right and left arms. It is important to see your caregiver soon for  a recheck. Most people have essential hypertension which means that there is not a specific cause. This type of high blood pressure may be lowered by changing lifestyle factors such as:  Stress.  Smoking.  Lack of exercise.  Excessive weight.  Drug/tobacco/alcohol use.  Eating less salt. Most people do not have symptoms from high blood pressure until it has caused damage  to the body. Effective treatment can often prevent, delay or reduce that damage. TREATMENT  When a cause has been identified, treatment for high blood pressure is directed at the cause. There are a large number of medications to treat HTN. These fall into several categories, and your caregiver will help you select the medicines that are best for you. Medications may have side effects. You should review side effects with your caregiver. If your blood pressure stays high after you have made lifestyle changes or started on medicines,   Your medication(s) may need to be changed.  Other problems may need to be addressed.  Be certain you understand your prescriptions, and know how and when to take your medicine.  Be sure to follow up with your caregiver within the time frame advised (usually within two weeks) to have your blood pressure rechecked and to review your medications.  If you are taking more than one medicine to lower your blood pressure, make sure you know how and at what times they should be taken. Taking two medicines at the same time can result in blood pressure that is too low. SEEK IMMEDIATE MEDICAL CARE IF:  You develop a severe headache, blurred or changing vision, or confusion.  You have unusual weakness or numbness, or a faint feeling.  You have severe chest or abdominal pain, vomiting, or breathing problems. MAKE SURE YOU:   Understand these instructions.  Will watch your condition.  Will get help right away if you are not doing well or get worse. Document Released: 04/27/2005 Document Revised: 07/20/2011 Document Reviewed: 12/16/2007 [facility] Patient Information 2013 [place], [place].       Dr Woodroe Mode Recommendations  Diet and Exercise discussed with patient.  For nutrition information, I recommend books:  1).Eat to Live by Dr Monico Hoar. 2).Prevent and Reverse Heart Disease by Dr Suzzette Righter.  Exercise recommendations are:  If unable to walk,  then the patient can exercise in a chair 3 times a day. By flapping arms like a bird gently and raising legs outwards to the front.  If ambulatory, the patient can go for walks for 30 minutes 3 times a week. Then increase the intensity and duration as tolerated.  Goal is to try to attain exercise frequency to 5 times a week.  If applicable: Best to perform resistance exercises (machines or weights) 2 days a week and cardio type exercises 3 days per week.

## 2012-09-21 NOTE — Progress Notes (Signed)
Patient ID: Gabriel Poole, male   DOB: 29-Aug-1945, 67 y.o.   MRN: [id number] SUBJECTIVE: HPI: Patient is here for follow up of hyperlipidemia/hypertension/gout:  denies Headache;denies Chest Pain;denies weakness;denies Shortness of Breath and orthopnea;denies Visual changes;denies palpitations;denies cough;denies pedal edema;denies symptoms of TIA or stroke;deniesClaudication symptoms. denies Compliance with medications; He has stopped several of his medications. And he is not clear which he should be taking regularly.  denies Problems with medications.    PMH/PSH: reviewed/updated in Epic  SH/FH: reviewed/updated in Epic  Allergies: reviewed/updated in Epic  Medications: reviewed/updated in Epic  Immunizations: reviewed/updated in Epic  ROS: As above in the HPI. All other systems are stable or negative.  OBJECTIVE: APPEARANCE:  Patient in no acute distress.The patient appeared well nourished and normally developed. Acyanotic. Waist: VITAL SIGNS:BP 155/90  Pulse 59  Temp(Src) 97 F (36.1 C) (Oral)  Wt 197 lb 3.2 oz (89.449 kg)  BMI 28.3 kg/m2 AA Male  SKIN: warm and  Dry without overt rashes, tattoos and scars  HEAD and Neck: without JVD, Head and scalp: normal Eyes:No scleral icterus. Fundi normal, eye movements normal. Ears: Auricle normal, canal normal, Tympanic membranes normal, insufflation normal. Nose: normal Throat: normal Neck & thyroid: normal  CHEST & LUNGS: Chest wall: normal Lungs: Clear  CVS: Reveals the PMI to be normally located. Regular rhythm, First and Second Heart sounds are normal,  absence of murmurs, rubs or gallops. Peripheral vasculature: Radial pulses: normal Dorsal pedis pulses: normal Posterior pulses: normal  ABDOMEN:  Appearance: normal Benign,, no organomegaly, no masses, no Abdominal Aortic enlargement. No Guarding , no rebound. No Bruits. Bowel sounds: normal  RECTAL: N/A GU: N/A  EXTREMETIES: nonedematous. Both  Femoral and Pedal pulses are normal.  MUSCULOSKELETAL:  Spine: normal Joints:left 1st MT-P joint is tender and mildly swollen.  NEUROLOGIC: oriented to time,place and person; nonfocal. Strength is normal Sensory is normal Reflexes are normal Cranial Nerves are normal.  ASSESSMENT: Gout - Plan: HYDROcodone-acetaminophen (NORCO/VICODIN) 5-325 MG per tablet, allopurinol (ZYLOPRIM) 300 MG tablet, indomethacin (INDOCIN) 50 MG capsule, Uric acid  HTN (hypertension) - Plan: amLODipine (NORVASC) 10 MG tablet, metoprolol (LOPRESSOR) 50 MG tablet, COMPLETE METABOLIC PANEL WITH GFR  HLD (hyperlipidemia) - Plan: atorvastatin (LIPITOR) 40 MG tablet, NMR Lipoprofile with Lipids, COMPLETE METABOLIC PANEL WITH GFR  Tobacco use disorder  Noncompliance  PLAN:       Dr Woodroe Mode Recommendations  Diet and Exercise discussed with patient.  For nutrition information, I recommend books:  1).Eat to Live by Dr Monico Hoar. 2).Prevent and Reverse Heart Disease by Dr Suzzette Righter.  Exercise recommendations are:  If unable to walk, then the patient can exercise in a chair 3 times a day. By flapping arms like a bird gently and raising legs outwards to the front.  If ambulatory, the patient can go for walks for 30 minutes 3 times a week. Then increase the intensity and duration as tolerated.  Goal is to try to attain exercise frequency to 5 times a week.  If applicable: Best to perform resistance exercises (machines or weights) 2 days a week and cardio type exercises 3 days per week. Orders Placed This Encounter  Procedures  . Uric acid  . NMR Lipoprofile with Lipids  . COMPLETE METABOLIC PANEL WITH GFR   Meds ordered this encounter  Medications  . DISCONTD: amLODipine (NORVASC) 10 MG tablet    Sig: Take 10 mg by mouth daily.  Marland Kitchen amLODipine (NORVASC) 10 MG tablet    Sig: Take 1  tablet (10 mg total) by mouth daily.    Dispense:  30 tablet    Refill:  5  . metoprolol (LOPRESSOR)  50 MG tablet    Sig: Take 1 tablet (50 mg total) by mouth 2 (two) times daily.    Dispense:  60 tablet    Refill:  5  . HYDROcodone-acetaminophen (NORCO/VICODIN) 5-325 MG per tablet    Sig: Take 1 tablet by mouth every 6 (six) hours as needed for pain.    Dispense:  20 tablet    Refill:  0  . allopurinol (ZYLOPRIM) 300 MG tablet    Sig: Take 1 tablet (300 mg total) by mouth daily.    Dispense:  30 tablet    Refill:  6  . atorvastatin (LIPITOR) 40 MG tablet    Sig: Take 1 tablet (40 mg total) by mouth daily.    Dispense:  30 tablet    Refill:  5  . indomethacin (INDOCIN) 50 MG capsule    Sig: Take 1 capsule (50 mg total) by mouth 3 (three) times daily with meals. As needed for gout attack    Dispense:  15 capsule    Refill:  1   Handouts in the AVS on: Smoking cessation, Hypertension, hyperlipidemia,Gout.  Need for compliance discussed and  Risks for end organ damage.  RTC 4 weeks to recheck BP and labs.  Mercedes Fort P. [place]esto Charon, M.D.

## 2012-09-23 LAB — NMR LIPOPROFILE WITH LIPIDS
Cholesterol, Total: 105 mg/dL (ref <200)
HDL Particle Number: 26.2 umol/L — ABNORMAL LOW (ref >=30.5)
HDL Size: 8.4 nm — ABNORMAL LOW (ref >=9.2)
HDL-C: 34 mg/dL — ABNORMAL LOW (ref >=40)
LDL (calc): 60 mg/dL (ref <100)
LDL Particle Number: 1035 nmol/L — ABNORMAL HIGH (ref <1000)
LDL Size: 20.2 nm — ABNORMAL LOW (ref >20.5)
LP-IR Score: 64 — ABNORMAL HIGH (ref <=45)
Large HDL-P: 2.1 umol/L — ABNORMAL LOW (ref >=4.8)
Large VLDL-P: <0.8 nmol/L (ref <=2.7)
Small LDL Particle Number: 652 nmol/L — ABNORMAL HIGH (ref <=527)
Triglycerides: 57 mg/dL (ref <150)
VLDL Size: 56.6 nm — ABNORMAL HIGH (ref <=46.6)

## 2012-10-05 ENCOUNTER — Encounter: Payer: Self-pay | Admitting: Family Medicine

## 2012-10-05 ENCOUNTER — Ambulatory Visit (INDEPENDENT_AMBULATORY_CARE_PROVIDER_SITE_OTHER): Payer: Medicare HMO

## 2012-10-05 ENCOUNTER — Ambulatory Visit (INDEPENDENT_AMBULATORY_CARE_PROVIDER_SITE_OTHER): Payer: Medicare HMO | Admitting: Family Medicine

## 2012-10-05 VITALS — BP 191/112 | HR 67 | Temp 97.6°F | Wt 198.6 lb

## 2012-10-05 DIAGNOSIS — M109 Gout, unspecified: Secondary | ICD-10-CM

## 2012-10-05 DIAGNOSIS — M549 Dorsalgia, unspecified: Secondary | ICD-10-CM

## 2012-10-05 DIAGNOSIS — I10 Essential (primary) hypertension: Secondary | ICD-10-CM

## 2012-10-05 MED ORDER — CYCLOBENZAPRINE HCL 5 MG PO TABS: 5.0000 mg | ORAL_TABLET | Freq: Three times a day (TID) | ORAL | Status: DC | PRN

## 2012-10-05 MED ORDER — NAPROXEN 500 MG PO TABS: 500.0000 mg | ORAL_TABLET | Freq: Two times a day (BID) | ORAL | Status: DC

## 2012-10-05 MED ORDER — HYDROCODONE-ACETAMINOPHEN 5-325 MG PO TABS: 1.0000 | ORAL_TABLET | Freq: Four times a day (QID) | ORAL | Status: DC | PRN

## 2012-10-05 NOTE — Progress Notes (Signed)
Patient ID: Gabriel Poole, male   DOB: Oct 24, 1945, 67 y.o.   MRN: [id number] SUBJECTIVE: HPI: Was bending over and pulled his back. Terrible spasms. No neurologic deficits. Wants a refill of the pain pills.  PMH/PSH: reviewed/updated in Epic  SH/FH: reviewed/updated in Epic  Allergies: reviewed/updated in Epic  Medications: reviewed/updated in Epic  Immunizations: reviewed/updated in Epic  ROS: As above in the HPI. All other systems are stable or negative.  OBJECTIVE: APPEARANCE:  Patient in no acute distress.The patient appeared well nourished and normally developed. Acyanotic. Waist: VITAL SIGNS:BP 191/112  Pulse 67  Temp(Src) 97.6 F (36.4 C) (Oral)  Wt 198 lb 9.6 oz (90.084 kg)  BMI 28.5 kg/m2 AAM in discomfort. Recheck BP 170/100 SKIN: warm and  Dry without overt rashes, tattoos and scars  HEAD and Neck: without JVD, Head and scalp: normal Eyes:No scleral icterus. Fundi normal, eye movements normal. Ears: Auricle normal, canal normal, Tympanic membranes normal, insufflation normal. Nose: normal Throat: normal Neck & thyroid: normal  CHEST & LUNGS: Chest wall: normal Lungs: Clear  CVS: Reveals the PMI to be normally located. Regular rhythm, First and Second Heart sounds are normal,  absence of murmurs, rubs or gallops. Peripheral vasculature: Radial pulses: normal Dorsal pedis pulses: normal Posterior pulses: normal  ABDOMEN:  Appearance: normal Benign,, no organomegaly, no masses, no Abdominal Aortic enlargement. No Guarding , no rebound. No Bruits. Bowel sounds: normal  RECTAL: N/A GU: N/A  EXTREMETIES: nonedematous. Both Femoral and Pedal pulses are normal.  MUSCULOSKELETAL:  Spine: normal appearing. The paralumbar muscles were in spasm. Joints: intact  NEUROLOGIC: oriented to time,place and person; nonfocal. Strength is normal Sensory is normal Reflexes are normal Cranial Nerves are normal. SLR is positive for pain.  [facility] reading  (PRIMARY) by  Dr.Ahmod Gillespie: no acute findings. No aortic calcifications  Seen.                                ASSESSMENT: HTN (hypertension) - Plan: EKG 12-Lead, EKG 12-Lead, DG Lumbar Spine 2-3 Views  Acute back pain - Plan: DG Lumbar Spine 2-3 Views, cyclobenzaprine (FLEXERIL) 5 MG tablet, naproxen (NAPROSYN) 500 MG tablet, HYDROcodone-acetaminophen (NORCO/VICODIN) 5-325 MG per tablet  Gout - Plan: HYDROcodone-acetaminophen (NORCO/VICODIN) 5-325 MG per tablet  BP very high secondary to a[pin.  PLAN: Orders Placed This Encounter  Procedures  . DG Lumbar Spine 2-3 Views    Standing Status: Future     Number of Occurrences: 1     Standing Expiration Date: 12/05/2013    Order Specific Question:  Reason for Exam (SYMPTOM  OR DIAGNOSIS REQUIRED)    Answer:  acute back pain    Order Specific Question:  Preferred imaging location?    Answer:  Internal  . EKG 12-Lead    Standing Status: Standing     Number of Occurrences: 1     Standing Expiration Date:     Order Specific Question:  Reason for Exam    Answer:  elevated bp   Results for orders placed in visit on 09/21/12  URIC ACID      Result Value Range   Uric Acid, Serum 4.4  4.0 - 6.0 mg/dL  NMR LIPOPROFILE WITH LIPIDS      Result Value Range   LDL Particle Number 1035 (*) <1000 nmol/L   LDL (calc) 60  <100 mg/dL   HDL-C 34 (*) >=40 mg/dL   Triglycerides 57  <981 mg/dL   Cholesterol,  Total 105  <200 mg/dL   HDL Particle Number 81.1 (*) >=30.5 umol/L   Large HDL-P 2.1 (*) >=4.8 umol/L   Large VLDL-P <0.8  <=2.7 nmol/L   Small LDL Particle Number 652 (*) <=527 nmol/L   LDL Size 20.2 (*) >20.5 nm   HDL Size 8.4 (*) >=9.2 nm   VLDL Size 56.6 (*) <=46.6 nm   LP-IR Score 64 (*) <=45  COMPLETE METABOLIC PANEL WITH GFR      Result Value Range   Sodium 143  135 - 145 mEq/L   Potassium 3.8  3.5 - 5.3 mEq/L   Chloride 108  96 - 112 mEq/L   CO2 27  19 - 32 mEq/L   Glucose, Bld 72  70 - 99 mg/dL   BUN 10  6 - 23 mg/dL   Creat 9.14   7.82 - 9.56 mg/dL   Total Bilirubin 0.7  0.3 - 1.2 mg/dL   Alkaline Phosphatase 102  39 - 117 U/L   AST 20  0 - 37 U/L   ALT 20  0 - 53 U/L   Total Protein 6.8  6.0 - 8.3 g/dL   Albumin 4.4  3.5 - 5.2 g/dL   Calcium 9.5  8.4 - 21.3 mg/dL   GFR, Est African American 80     GFR, Est Non African American 69     Meds ordered this encounter  Medications  . cyclobenzaprine (FLEXERIL) 5 MG tablet    Sig: Take 1 tablet (5 mg total) by mouth 3 (three) times daily as needed for muscle spasms.    Dispense:  30 tablet    Refill:  1  . naproxen (NAPROSYN) 500 MG tablet    Sig: Take 1 tablet (500 mg total) by mouth 2 (two) times daily with a meal.    Dispense:  20 tablet    Refill:  0  . HYDROcodone-acetaminophen (NORCO/VICODIN) 5-325 MG per tablet    Sig: Take 1 tablet by mouth every 6 (six) hours as needed for pain.    Dispense:  12 tablet    Refill:  0   Heat, ben gays, massages to the lower back. Consider PHysical therapy if not resolved. Monitor BP daily , if BP doesn't come down within a couple of days RTC stat for med adjustment.  RTC in 2 weeks for  Routine follow up.  EKG, no change from previous.   Evaline Waltman P. [place] Charon, M.D.

## 2012-10-06 NOTE — Progress Notes (Signed)
Quick Note:  Call patient. Xray normal. No change in plan. ______

## 2012-10-07 ENCOUNTER — Ambulatory Visit: Payer: Medicare HMO | Admitting: Family Medicine

## 2012-10-11 ENCOUNTER — Other Ambulatory Visit: Payer: Self-pay | Admitting: Family Medicine

## 2012-10-12 NOTE — Telephone Encounter (Signed)
Filled 10/05/12 but only for #20, only 10 day supply

## 2012-10-20 ENCOUNTER — Encounter: Payer: Self-pay | Admitting: Family Medicine

## 2012-10-20 ENCOUNTER — Ambulatory Visit (INDEPENDENT_AMBULATORY_CARE_PROVIDER_SITE_OTHER): Payer: Medicare HMO | Admitting: Family Medicine

## 2012-10-20 VITALS — BP 159/91 | HR 64 | Temp 97.2°F | Wt 193.0 lb

## 2012-10-20 DIAGNOSIS — E785 Hyperlipidemia, unspecified: Secondary | ICD-10-CM

## 2012-10-20 DIAGNOSIS — F17200 Nicotine dependence, unspecified, uncomplicated: Secondary | ICD-10-CM

## 2012-10-20 DIAGNOSIS — I10 Essential (primary) hypertension: Secondary | ICD-10-CM

## 2012-10-20 DIAGNOSIS — R29898 Other symptoms and signs involving the musculoskeletal system: Secondary | ICD-10-CM | POA: Insufficient documentation

## 2012-10-20 DIAGNOSIS — M5416 Radiculopathy, lumbar region: Secondary | ICD-10-CM | POA: Insufficient documentation

## 2012-10-20 DIAGNOSIS — M109 Gout, unspecified: Secondary | ICD-10-CM

## 2012-10-20 DIAGNOSIS — IMO0002 Reserved for concepts with insufficient information to code with codable children: Secondary | ICD-10-CM

## 2012-10-20 MED ORDER — PREDNISONE 20 MG PO TABS: 40.0000 mg | ORAL_TABLET | Freq: Every day | ORAL | Status: DC

## 2012-10-20 MED ORDER — LOSARTAN POTASSIUM 50 MG PO TABS: 50.0000 mg | ORAL_TABLET | Freq: Every day | ORAL | Status: DC

## 2012-10-20 NOTE — Progress Notes (Signed)
Patient ID: Gabriel Poole, male   DOB: 06-21-1945, 67 y.o.   MRN: [id number] SUBJECTIVE: CC: Chief Complaint  Patient presents with  . Follow-up    reck bp and c/o rt leg pain   disucss labs from may 14    HPI:  Patient is here for follow up of hyperlipidemia/hypertension and GOUT:  denies Headache;denies Chest Pain;denies weakness;denies Shortness of Breath and orthopnea;denies Visual changes;denies palpitations;denies cough;denies pedal edema;denies symptoms of TIA or stroke;deniesClaudication symptoms. admits to Compliance with medications; denies Problems with medications.  Other problems unchanged. Pain down the right leg when he sits too long. Back still hurts   PMH/PSH: reviewed/updated in Epic  SH/FH: reviewed/updated in Epic  Allergies: reviewed/updated in Epic  Medications: reviewed/updated in Epic  Immunizations: reviewed/updated in Epic  ROS: As above in the HPI. All other systems are stable or negative.  OBJECTIVE: APPEARANCE:  Patient in no acute distress.The patient appeared well nourished and normally developed. Acyanotic. Waist: VITAL SIGNS:BP 159/91  Pulse 64  Temp(Src) 97.2 F (36.2 C) (Oral)  Wt 193 lb (87.544 kg)  BMI 27.69 kg/m2  AAM SKIN: warm and  Dry without overt rashes, tattoos and scars  HEAD and Neck: without JVD, Head and scalp: normal Eyes:No scleral icterus. Fundi normal, eye movements normal. Ears: Auricle normal, canal normal, Tympanic membranes normal, insufflation normal. Nose: normal Throat: normal Neck & thyroid: normal  CHEST & LUNGS: Chest wall: normal Lungs: Clear  CVS: Reveals the PMI to be normally located. Regular rhythm, First and Second Heart sounds are normal,  absence of murmurs, rubs or gallops. Peripheral vasculature: Radial pulses: normal Dorsal pedis pulses: normal Posterior pulses: normal  ABDOMEN:  Appearance: normal Benign, no organomegaly, no masses, no Abdominal Aortic enlargement. No  Guarding , no rebound. No Bruits. Bowel sounds: normal  RECTAL: N/A GU: N/A  EXTREMETIES: nonedematous. Both Femoral and Pedal pulses are normal.  MUSCULOSKELETAL:  Spine: pain on full flexion Joints: intact  NEUROLOGIC: oriented to time,place and person; nonfocal. Strength is slightly reduced 4.5/5 in the right lower extremitySensory is normal Reflexes are normal Cranial Nerves are normal. Results for orders placed in visit on 09/21/12  URIC ACID      Result Value Range   Uric Acid, Serum 4.4  4.0 - 6.0 mg/dL  NMR LIPOPROFILE WITH LIPIDS      Result Value Range   LDL Particle Number 1035 (*) <1000 nmol/L   LDL (calc) 60  <100 mg/dL   HDL-C 34 (*) >=40 mg/dL   Triglycerides 57  <981 mg/dL   Cholesterol, Total 191  <200 mg/dL   HDL Particle Number 47.8 (*) >=30.5 umol/L   Large HDL-P 2.1 (*) >=4.8 umol/L   Large VLDL-P <0.8  <=2.7 nmol/L   Small LDL Particle Number 652 (*) <=527 nmol/L   LDL Size 20.2 (*) >20.5 nm   HDL Size 8.4 (*) >=9.2 nm   VLDL Size 56.6 (*) <=46.6 nm   LP-IR Score 64 (*) <=45  COMPLETE METABOLIC PANEL WITH GFR      Result Value Range   Sodium 143  135 - 145 mEq/L   Potassium 3.8  3.5 - 5.3 mEq/L   Chloride 108  96 - 112 mEq/L   CO2 27  19 - 32 mEq/L   Glucose, Bld 72  70 - 99 mg/dL   BUN 10  6 - 23 mg/dL   Creat 2.95  6.21 - 3.08 mg/dL   Total Bilirubin 0.7  0.3 - 1.2 mg/dL   Alkaline  Phosphatase 102  39 - 117 U/L   AST 20  0 - 37 U/L   ALT 20  0 - 53 U/L   Total Protein 6.8  6.0 - 8.3 g/dL   Albumin 4.4  3.5 - 5.2 g/dL   Calcium 9.5  8.4 - 96.0 mg/dL   GFR, Est African American 80     GFR, Est Non African American 69      ASSESSMENT: HTN (hypertension) - Plan: losartan (COZAAR) 50 MG tablet  Tobacco use disorder  HLD (hyperlipidemia)  Gout  Lumbar radiculopathy - Plan: MR Lumbar Spine Wo Contrast, predniSONE (DELTASONE) 20 MG tablet  Right leg weakness - Plan: MR Lumbar Spine Wo Contrast, predniSONE (DELTASONE) 20 MG  tablet  HTN not controlled  PLAN:    Orders Placed This Encounter  Procedures  . MR Lumbar Spine Wo Contrast    Standing Status: Future     Number of Occurrences:      Standing Expiration Date: 12/20/2013    Order Specific Question:  Reason for Exam (SYMPTOM  OR DIAGNOSIS REQUIRED)    Answer:  lumbar radicular pain with right leg weakness    Order Specific Question:  Preferred imaging location?    Answer:  [facility]    Order Specific Question:  Does the patient have a pacemaker, internal devices, implants, aneury    Answer:  No   Meds ordered this encounter  Medications  . predniSONE (DELTASONE) 20 MG tablet    Sig: Take 2 tablets (40 mg total) by mouth daily. For  3 days the 1 tab daily for 2 days then 1/2 tab daily for 2 days.    Dispense:  9 tablet    Refill:  0  . losartan (COZAAR) 50 MG tablet    Sig: Take 1 tablet (50 mg total) by mouth daily.    Dispense:  30 tablet    Refill:  3  stop NSAIDS Smoking cessation: discussed and counselled. Handout in the AVS.  Return in about 4 weeks (around 11/17/2012) for Recheck medical problems, recheck BP.  Alvis Edgell P. [place] Charon, M.D.

## 2012-10-20 NOTE — Patient Instructions (Addendum)
Stop smoking  Stop naproxen.  Avoid using the indomethacin.  Smoking Cessation Quitting smoking is important to your health and has many advantages. However, it is not always easy to quit since nicotine is a very addictive drug. Often times, people try 3 times or more before being able to quit. This document explains the best ways for you to prepare to quit smoking. Quitting takes hard work and a lot of effort, but you can do it. ADVANTAGES OF QUITTING SMOKING  You will live longer, feel better, and live better.  Your body will feel the impact of quitting smoking almost immediately.  Within 20 minutes, blood pressure decreases. Your pulse returns to its normal level.  After 8 hours, carbon monoxide levels in the blood return to normal. Your oxygen level increases.  After 24 hours, the chance of having a heart attack starts to decrease. Your breath, hair, and body stop smelling like smoke.  After 48 hours, damaged nerve endings begin to recover. Your sense of taste and smell improve.  After 72 hours, the body is virtually free of nicotine. Your bronchial tubes relax and breathing becomes easier.  After 2 to 12 weeks, lungs can hold more air. Exercise becomes easier and circulation improves.  The risk of having a heart attack, stroke, cancer, or lung disease is greatly reduced.  After 1 year, the risk of coronary heart disease is cut in half.  After 5 years, the risk of stroke falls to the same as a nonsmoker.  After 10 years, the risk of lung cancer is cut in half and the risk of other cancers decreases significantly.  After 15 years, the risk of coronary heart disease drops, usually to the level of a nonsmoker.  If you are pregnant, quitting smoking will improve your chances of having a healthy baby.  The people you live with, especially any children, will be healthier.  You will have extra money to spend on things other than cigarettes. QUESTIONS TO THINK ABOUT BEFORE  ATTEMPTING TO QUIT You may want to talk about your answers with your caregiver.  Why do you want to quit?  If you tried to quit in the past, what helped and what did not?  What will be the most difficult situations for you after you quit? How will you plan to handle them?  Who can help you through the tough times? Your family? Friends? A caregiver?  What pleasures do you get from smoking? What ways can you still get pleasure if you quit? Here are some questions to ask your caregiver:  How can you help me to be successful at quitting?  What medicine do you think would be best for me and how should I take it?  What should I do if I need more help?  What is smoking withdrawal like? How can I get information on withdrawal? GET READY  Set a quit date.  Change your environment by getting rid of all cigarettes, ashtrays, matches, and lighters in your home, car, or work. Do not let people smoke in your home.  Review your past attempts to quit. Think about what worked and what did not. GET SUPPORT AND ENCOURAGEMENT You have a better chance of being successful if you have help. You can get support in many ways.  Tell your family, friends, and co-workers that you are going to quit and need their support. Ask them not to smoke around you.  Get individual, group, or telephone counseling and support. Programs are available at  local hospitals and health centers. Call your local health department for information about programs in your area.  Spiritual beliefs and practices may help some smokers quit.  Download a "quit meter" on your computer to keep track of quit statistics, such as how long you have gone without smoking, cigarettes not smoked, and money saved.  Get a self-help book about quitting smoking and staying off of tobacco. LEARN NEW SKILLS AND BEHAVIORS  Distract yourself from urges to smoke. Talk to someone, go for a walk, or occupy your time with a task.  Change your normal  routine. Take a different route to work. Drink tea instead of coffee. Eat breakfast in a different place.  Reduce your stress. Take a hot bath, exercise, or read a book.  Plan something enjoyable to do every day. Reward yourself for not smoking.  Explore interactive web-based programs that specialize in helping you quit. GET MEDICINE AND USE IT CORRECTLY Medicines can help you stop smoking and decrease the urge to smoke. Combining medicine with the above behavioral methods and support can greatly increase your chances of successfully quitting smoking.  Nicotine replacement therapy helps deliver nicotine to your body without the negative effects and risks of smoking. Nicotine replacement therapy includes nicotine gum, lozenges, inhalers, nasal sprays, and skin patches. Some may be available over-the-counter and others require a prescription.  Antidepressant medicine helps people abstain from smoking, but how this works is unknown. This medicine is available by prescription.  Nicotinic receptor partial agonist medicine simulates the effect of nicotine in your brain. This medicine is available by prescription. Ask your caregiver for advice about which medicines to use and how to use them based on your health history. Your caregiver will tell you what side effects to look out for if you choose to be on a medicine or therapy. Carefully read the information on the package. Do not use any other product containing nicotine while using a nicotine replacement product.  RELAPSE OR DIFFICULT SITUATIONS Most relapses occur within the first 3 months after quitting. Do not be discouraged if you start smoking again. Remember, most people try several times before finally quitting. You may have symptoms of withdrawal because your body is used to nicotine. You may crave cigarettes, be irritable, feel very hungry, cough often, get headaches, or have difficulty concentrating. The withdrawal symptoms are only temporary.  They are strongest when you first quit, but they will go away within 10 14 days. To reduce the chances of relapse, try to:  Avoid drinking alcohol. Drinking lowers your chances of successfully quitting.  Reduce the amount of caffeine you consume. Once you quit smoking, the amount of caffeine in your body increases and can give you symptoms, such as a rapid heartbeat, sweating, and anxiety.  Avoid smokers because they can make you want to smoke.  Do not let weight gain distract you. Many smokers will gain weight when they quit, usually less than 10 pounds. Eat a healthy diet and stay active. You can always lose the weight gained after you quit.  Find ways to improve your mood other than smoking. FOR MORE INFORMATION  www.smokefree.gov  Document Released: 04/21/2001 Document Revised: 10/27/2011 Document Reviewed: 08/06/2011 [facility] Patient Information 2014 [place], [place].

## 2012-10-31 ENCOUNTER — Ambulatory Visit (HOSPITAL_COMMUNITY): Payer: Medicare HMO

## 2012-11-02 ENCOUNTER — Ambulatory Visit (HOSPITAL_COMMUNITY): Payer: Medicare HMO

## 2012-11-04 ENCOUNTER — Ambulatory Visit (HOSPITAL_COMMUNITY): Payer: Medicare HMO

## 2012-11-07 ENCOUNTER — Telehealth: Payer: Self-pay | Admitting: Family Medicine

## 2012-11-07 ENCOUNTER — Other Ambulatory Visit: Payer: Self-pay | Admitting: Family Medicine

## 2012-11-07 MED ORDER — CLONIDINE HCL 0.1 MG PO TABS: ORAL_TABLET | ORAL | Status: DC

## 2012-11-07 NOTE — Telephone Encounter (Signed)
Sent clonidine 0.1mg  po qhs for hypertension and dc losartan

## 2012-11-07 NOTE — Telephone Encounter (Signed)
Pt states losartan 50mg  making him itch. Pt states stopped the med yest wants something called to [facility]. Has appt with dr Modesto Charon on July 25,2014.

## 2012-11-07 NOTE — Telephone Encounter (Signed)
Spoke with pt and is aware that new rx has been sent to his pharmacy

## 2012-11-10 ENCOUNTER — Ambulatory Visit (HOSPITAL_COMMUNITY): Admission: RE | Admit: 2012-11-10 | Payer: Medicare HMO | Source: Ambulatory Visit

## 2012-11-10 ENCOUNTER — Telehealth: Payer: Self-pay | Admitting: Family Medicine

## 2012-11-14 ENCOUNTER — Telehealth: Payer: Self-pay | Admitting: Family Medicine

## 2012-11-14 NOTE — Telephone Encounter (Signed)
PER Gina H patient called and will see Dr. Modesto Charon in the am at 820 am

## 2012-11-15 ENCOUNTER — Encounter: Payer: Self-pay | Admitting: Family Medicine

## 2012-11-15 ENCOUNTER — Ambulatory Visit (INDEPENDENT_AMBULATORY_CARE_PROVIDER_SITE_OTHER): Payer: Medicare HMO | Admitting: Family Medicine

## 2012-11-15 VITALS — BP 131/81 | HR 66 | Temp 97.3°F | Wt 194.0 lb

## 2012-11-15 DIAGNOSIS — I10 Essential (primary) hypertension: Secondary | ICD-10-CM

## 2012-11-15 DIAGNOSIS — E785 Hyperlipidemia, unspecified: Secondary | ICD-10-CM

## 2012-11-15 DIAGNOSIS — Z720 Tobacco use: Secondary | ICD-10-CM

## 2012-11-15 DIAGNOSIS — F17200 Nicotine dependence, unspecified, uncomplicated: Secondary | ICD-10-CM

## 2012-11-15 DIAGNOSIS — L299 Pruritus, unspecified: Secondary | ICD-10-CM | POA: Insufficient documentation

## 2012-11-15 DIAGNOSIS — M109 Gout, unspecified: Secondary | ICD-10-CM

## 2012-11-15 LAB — POCT CBC
Granulocyte percent: 66.1 %G (ref 37–80)
HCT, POC: 41.3 % — AB (ref 43.5–53.7)
Hemoglobin: 14.8 g/dL (ref 14.1–18.1)
Lymph, poc: 2.4 (ref 0.6–3.4)
MCH, POC: 31.7 pg — AB (ref 27–31.2)
MCHC: 35.8 g/dL — AB (ref 31.8–35.4)
MCV: 88.7 fL (ref 80–97)
MPV: 7.9 fL (ref 0–99.8)
POC Granulocyte: 5.7 (ref 2–6.9)
POC LYMPH PERCENT: 28.1 %L (ref 10–50)
Platelet Count, POC: 287 10*3/uL (ref 142–424)
RBC: 4.7 M/uL (ref 4.69–6.13)
RDW, POC: 13.5 %
WBC: 8.6 10*3/uL (ref 4.6–10.2)

## 2012-11-15 LAB — COMPLETE METABOLIC PANEL WITH GFR
ALT: 27 U/L (ref 0–53)
AST: 19 U/L (ref 0–37)
Albumin: 4.3 g/dL (ref 3.5–5.2)
Alkaline Phosphatase: 108 U/L (ref 39–117)
BUN: 12 mg/dL (ref 6–23)
CO2: 27 mEq/L (ref 19–32)
Calcium: 10.1 mg/dL (ref 8.4–10.5)
Chloride: 104 mEq/L (ref 96–112)
Creat: 1.18 mg/dL (ref 0.50–1.35)
GFR, Est African American: 74 mL/min
GFR, Est Non African American: 64 mL/min
Glucose, Bld: 69 mg/dL — ABNORMAL LOW (ref 70–99)
Potassium: 3.8 mEq/L (ref 3.5–5.3)
Sodium: 141 mEq/L (ref 135–145)
Total Bilirubin: 0.6 mg/dL (ref 0.3–1.2)
Total Protein: 6.9 g/dL (ref 6.0–8.3)

## 2012-11-15 MED ORDER — CETIRIZINE HCL 10 MG PO TABS: 10.0000 mg | ORAL_TABLET | Freq: Every day | ORAL | Status: DC

## 2012-11-15 NOTE — Progress Notes (Signed)
Patient ID: Gabriel Poole, male   DOB: 02-15-1946, 67 y.o.   MRN: [id number] SUBJECTIVE: CC: Chief Complaint  Patient presents with  . Acute Visit    itching all over stopped bp week last thursday., Pt never had Colonscopy  . Back Pain    ?MRI it got cancelled?    HPI: Patient is here for follow up of hypertension and itching which he attributes to a medicine. denies Headache;deniesChest Pain;denies weakness;denies Shortness of Breath or Orthopnea;denies Visual changes;denies palpitations;denies cough;denies pedal edema;denies symptoms of TIA or stroke; nonCompliance with medications.doesn't know the names of his medications. Problems with medications.Itching  Leg and back pain has resolved.  Past Medical History  Diagnosis Date  . Hypertension   . Arthritis   . Heart murmur   . Gout   . Hyperlipidemia    Past Surgical History  Procedure Laterality Date  . Finger surgery      Lt middle   . Hydrocele excision  02/12/2011    Procedure: HYDROCELECTOMY ADULT;  Surgeon: Ky Barban;  Location: [facility];  Service: Urology;  Laterality: Left;   History   Social History  . Marital Status: Married    Spouse Name: N/A    Number of Children: N/A  . Years of Education: N/A   Occupational History  . Not on file.   Social History Main Topics  . Smoking status: Current Every Day Smoker -- 0.50 packs/day for 45 years    Types: Cigarettes  . Smokeless tobacco: Not on file  . Alcohol Use: No  . Drug Use: No  . Sexually Active: Yes   Other Topics Concern  . Not on file   Social History Narrative  . No narrative on file   Family History  Problem Relation Age of Onset  . Hypertension Mother   . Hypotension Neg Hx   . Anesthesia problems Neg Hx   . Malignant hyperthermia Neg Hx   . Pseudochol deficiency Neg Hx    Current Outpatient Prescriptions on File Prior to Visit  Medication Sig Dispense Refill  . acetaminophen (TYLENOL) 650 MG CR tablet Take 650 mg by mouth  daily as needed. For pain       . allopurinol (ZYLOPRIM) 300 MG tablet Take 1 tablet (300 mg total) by mouth daily.  30 tablet  6  . amLODipine (NORVASC) 10 MG tablet Take 1 tablet (10 mg total) by mouth daily.  30 tablet  5  . aspirin 325 MG EC tablet Take 325 mg by mouth daily.        Marland Kitchen atorvastatin (LIPITOR) 40 MG tablet Take 1 tablet (40 mg total) by mouth daily.  30 tablet  5  . cloNIDine (CATAPRES) 0.1 MG tablet One po qhs  30 tablet  3  . cyclobenzaprine (FLEXERIL) 5 MG tablet Take 1 tablet (5 mg total) by mouth 3 (three) times daily as needed for muscle spasms.  30 tablet  1  . HYDROcodone-acetaminophen (NORCO/VICODIN) 5-325 MG per tablet Take 1 tablet by mouth every 6 (six) hours as needed for pain.  12 tablet  0  . losartan (COZAAR) 50 MG tablet Take 1 tablet (50 mg total) by mouth daily.  30 tablet  3  . metoprolol (LOPRESSOR) 50 MG tablet Take 1 tablet (50 mg total) by mouth 2 (two) times daily.  60 tablet  5  . indomethacin (INDOCIN) 50 MG capsule Take 1 capsule (50 mg total) by mouth 3 (three) times daily with meals. As needed for gout  attack  15 capsule  1  . predniSONE (DELTASONE) 20 MG tablet Take 2 tablets (40 mg total) by mouth daily. For  3 days the 1 tab daily for 2 days then 1/2 tab daily for 2 days.  9 tablet  0   No current facility-administered medications on file prior to visit.   Allergies  Allergen Reactions  . Ace Inhibitors   . Losartan     itching    There is no immunization history on file for this patient. Prior to Admission medications   Medication Sig Start Date End Date Taking? Authorizing Provider  acetaminophen (TYLENOL) 650 MG CR tablet Take 650 mg by mouth daily as needed. For pain    Yes Historical Provider, MD  allopurinol (ZYLOPRIM) 300 MG tablet Take 1 tablet (300 mg total) by mouth daily. 09/21/12  Yes Ileana Ladd, MD  amLODipine (NORVASC) 10 MG tablet Take 1 tablet (10 mg total) by mouth daily. 09/21/12  Yes Ileana Ladd, MD  aspirin 325 MG  EC tablet Take 325 mg by mouth daily.     Yes Historical Provider, MD  atorvastatin (LIPITOR) 40 MG tablet Take 1 tablet (40 mg total) by mouth daily. 09/21/12  Yes Ileana Ladd, MD  cloNIDine (CATAPRES) 0.1 MG tablet One po qhs 11/07/12  Yes Deatra Canter, FNP  cyclobenzaprine (FLEXERIL) 5 MG tablet Take 1 tablet (5 mg total) by mouth 3 (three) times daily as needed for muscle spasms. 10/05/12  Yes Ileana Ladd, MD  HYDROcodone-acetaminophen (NORCO/VICODIN) 5-325 MG per tablet Take 1 tablet by mouth every 6 (six) hours as needed for pain. 10/05/12  Yes Ileana Ladd, MD  losartan (COZAAR) 50 MG tablet Take 1 tablet (50 mg total) by mouth daily. 10/20/12  Yes Ileana Ladd, MD  metoprolol (LOPRESSOR) 50 MG tablet Take 1 tablet (50 mg total) by mouth 2 (two) times daily. 09/21/12 09/21/13 Yes Ileana Ladd, MD  cetirizine (ZYRTEC) 10 MG tablet Take 1 tablet (10 mg total) by mouth daily. 11/15/12   Ileana Ladd, MD  indomethacin (INDOCIN) 50 MG capsule Take 1 capsule (50 mg total) by mouth 3 (three) times daily with meals. As needed for gout attack 09/21/12   Ileana Ladd, MD  predniSONE (DELTASONE) 20 MG tablet Take 2 tablets (40 mg total) by mouth daily. For  3 days the 1 tab daily for 2 days then 1/2 tab daily for 2 days. 10/20/12   Ileana Ladd, MD   Claims he takes only 3 medicines.  ROS: As above in the HPI. All other systems are stable or negative.  OBJECTIVE: APPEARANCE:  Patient in no acute distress.The patient appeared well nourished and normally developed. Acyanotic. Waist: VITAL SIGNS:BP 131/81  Pulse 66  Temp(Src) 97.3 F (36.3 C)  Wt 194 lb (87.998 kg)  BMI 27.84 kg/m2 AAM scratching all over Well dressed/attired  SKIN: warm and  Dry with a red blotchy rash in various  areas,no tattoos and scars  HEAD and Neck: without JVD, Head and scalp: normal Eyes:No scleral icterus. Fundi normal, eye movements normal. Ears: Auricle normal, canal normal, Tympanic membranes  normal, insufflation normal. Nose: normal Throat: normal Neck & thyroid: normal  CHEST & LUNGS: Chest wall: normal Lungs: Clear  CVS: Reveals the PMI to be normally located. Regular rhythm, First and Second Heart sounds are normal,  absence of murmurs, rubs or gallops.  ABDOMEN:  Appearance: normal Benign, no organomegaly, no masses, no Abdominal Aortic enlargement. No  Guarding , no rebound. No Bruits. Bowel sounds: normal  RECTAL: N/A GU: N/A  EXTREMETIES: nonedematous.   MUSCULOSKELETAL:  Spine: normal Joints: intact  NEUROLOGIC: oriented to time,place and person; nonfocal. Strength is normal Cranial Nerves are normal.  ASSESSMENT: Pruritus - Plan: cetirizine (ZYRTEC) 10 MG tablet, POCT CBC, COMPLETE METABOLIC PANEL WITH GFR  Tobacco abuse  HTN (hypertension)  HLD (hyperlipidemia)  Gout   PLAN:  Patient does not have good insight of his health issues and his medications and this is a challenge in providing care to this  Individual. Risks for polypharmacy exists. Discussed with patient to take  resposibility for his  Health and knowledge and  Awareness of his meds. He is to stop  Most medications  Except the metoprolol  And the clonidine and return to clinic in 2 days with all the medications he has been taking on and off. Orders Placed This Encounter  Procedures  . COMPLETE METABOLIC PANEL WITH GFR  . POCT CBC    Meds ordered this encounter  Medications  . cetirizine (ZYRTEC) 10 MG tablet    Sig: Take 1 tablet (10 mg total) by mouth daily.    Dispense:  15 tablet    Refill:  0    will review his meds in 2 days with him to determine if his  Pruritus is truly a  Drug reaction. counselled on smoking cessation again. Patient was not receptive to stop.  Return in about 2 days (around 11/17/2012) for Recheck medical problems.  Krissia Schreier P. Modesto Charon, M.D.

## 2012-11-15 NOTE — Telephone Encounter (Signed)
Pt seen today with dr wong 

## 2012-11-15 NOTE — Progress Notes (Signed)
Quick Note:  Call patient. Labs normal. No change in plan. ______ 

## 2012-11-17 ENCOUNTER — Ambulatory Visit (INDEPENDENT_AMBULATORY_CARE_PROVIDER_SITE_OTHER): Payer: Medicare HMO | Admitting: Family Medicine

## 2012-11-17 ENCOUNTER — Encounter: Payer: Self-pay | Admitting: Family Medicine

## 2012-11-17 VITALS — BP 151/86 | HR 69 | Temp 97.5°F | Wt 194.8 lb

## 2012-11-17 DIAGNOSIS — M109 Gout, unspecified: Secondary | ICD-10-CM

## 2012-11-17 DIAGNOSIS — Z720 Tobacco use: Secondary | ICD-10-CM

## 2012-11-17 DIAGNOSIS — F17200 Nicotine dependence, unspecified, uncomplicated: Secondary | ICD-10-CM

## 2012-11-17 DIAGNOSIS — I10 Essential (primary) hypertension: Secondary | ICD-10-CM

## 2012-11-17 DIAGNOSIS — L299 Pruritus, unspecified: Secondary | ICD-10-CM

## 2012-11-17 DIAGNOSIS — E785 Hyperlipidemia, unspecified: Secondary | ICD-10-CM

## 2012-11-17 MED ORDER — PREDNISONE 20 MG PO TABS: 40.0000 mg | ORAL_TABLET | Freq: Every day | ORAL | Status: DC

## 2012-11-17 NOTE — Progress Notes (Signed)
Patient ID: Gabriel Poole, male   DOB: 1946-04-10, 67 y.o.   MRN: [id number] SUBJECTIVE: CC: Chief Complaint  Patient presents with  . Follow-up    still itching    HPI: Patient is here for follow up of hypertension and to bring all his meds in for  Review. He is actually taking only clonidine and metoprolol. He is still scratching. denies Headache;deniesChest Pain;denies weakness;denies Shortness of Breath or Orthopnea;denies Visual changes;denies palpitations;denies cough;denies pedal edema;denies symptoms of TIA or stroke; admits to Compliance with medications. denies Problems with medications. BP has been going up.  Past Medical History  Diagnosis Date  . Hypertension   . Arthritis   . Heart murmur   . Gout   . Hyperlipidemia    Past Surgical History  Procedure Laterality Date  . Finger surgery      Lt middle   . Hydrocele excision  02/12/2011    Procedure: HYDROCELECTOMY ADULT;  Surgeon: Ky Barban;  Location: [facility];  Service: Urology;  Laterality: Left;   History   Social History  . Marital Status: Married    Spouse Name: N/A    Number of Children: N/A  . Years of Education: N/A   Occupational History  . Not on file.   Social History Main Topics  . Smoking status: Current Every Day Smoker -- 0.50 packs/day for 45 years    Types: Cigarettes  . Smokeless tobacco: Not on file  . Alcohol Use: No  . Drug Use: No  . Sexually Active: Yes   Other Topics Concern  . Not on file   Social History Narrative  . No narrative on file   Family History  Problem Relation Age of Onset  . Hypertension Mother   . Hypotension Neg Hx   . Anesthesia problems Neg Hx   . Malignant hyperthermia Neg Hx   . Pseudochol deficiency Neg Hx    Current Outpatient Prescriptions on File Prior to Visit  Medication Sig Dispense Refill  . cloNIDine (CATAPRES) 0.1 MG tablet One po qhs  30 tablet  3  . metoprolol (LOPRESSOR) 50 MG tablet Take 1 tablet (50 mg total) by mouth 2  (two) times daily.  60 tablet  5  . amLODipine (NORVASC) 10 MG tablet Take 1 tablet (10 mg total) by mouth daily.  30 tablet  5  . atorvastatin (LIPITOR) 40 MG tablet Take 1 tablet (40 mg total) by mouth daily.  30 tablet  5   No current facility-administered medications on file prior to visit.   Allergies  Allergen Reactions  . Ace Inhibitors   . Losartan     itching    There is no immunization history on file for this patient. Prior to Admission medications   Medication Sig Start Date End Date Taking? Authorizing Provider  cloNIDine (CATAPRES) 0.1 MG tablet One po qhs 11/07/12  Yes Deatra Canter, FNP  metoprolol (LOPRESSOR) 50 MG tablet Take 1 tablet (50 mg total) by mouth 2 (two) times daily. 09/21/12 09/21/13 Yes Ileana Ladd, MD  amLODipine (NORVASC) 10 MG tablet Take 1 tablet (10 mg total) by mouth daily. 09/21/12   Ileana Ladd, MD  aspirin 81 MG tablet Take 81 mg by mouth daily.    Historical Provider, MD  atorvastatin (LIPITOR) 40 MG tablet Take 1 tablet (40 mg total) by mouth daily. 09/21/12   Ileana Ladd, MD  predniSONE (DELTASONE) 20 MG tablet Take 2 tablets (40 mg total) by mouth daily. For 3  days, then 2 tablets  Daily for 2 days, then 1 tablet daily for 2 days 11/17/12   Ileana Ladd, MD     ROS: As above in the HPI. All other systems are stable or negative.  OBJECTIVE: APPEARANCE:  Patient in no acute distress.The patient appeared well nourished and normally developed. Acyanotic. Waist: VITAL SIGNS:BP 151/86  Pulse 69  Temp(Src) 97.5 F (36.4 C) (Oral)  Wt 194 lb 12.8 oz (88.361 kg)  BMI 27.95 kg/m2 AAM  SKIN: warm and  Dry without overt rashes, tattoos and scars  HEAD and Neck: without JVD, Head and scalp: normal Eyes:No scleral icterus. Fundi normal, eye movements normal. Ears: Auricle normal, canal normal, Tympanic membranes normal, insufflation normal. Nose: normal Throat: normal Neck & thyroid: normal  CHEST & LUNGS: Chest wall:  normal Lungs: Clear  CVS: Reveals the PMI to be normally located. Regular rhythm, First and Second Heart sounds are normal,  absence of murmurs, rubs or gallops. Peripheral vasculature: Radial pulses: normal Dorsal pedis pulses: normal Posterior pulses: normal  ABDOMEN:  Appearance: normal Benign, no organomegaly, no masses, no Abdominal Aortic enlargement. No Guarding , no rebound. No Bruits. Bowel sounds: normal  RECTAL: N/A GU: N/A  EXTREMETIES: nonedematous. Both Femoral and Pedal pulses are normal.  MUSCULOSKELETAL:  Spine: normal Joints: intact  NEUROLOGIC: oriented to time,place and person; nonfocal. Strength is normal Sensory is normal Reflexes are normal Cranial Nerves are normal.   Results for orders placed in visit on 11/15/12  COMPLETE METABOLIC PANEL WITH GFR      Result Value Range   Sodium 141  135 - 145 mEq/L   Potassium 3.8  3.5 - 5.3 mEq/L   Chloride 104  96 - 112 mEq/L   CO2 27  19 - 32 mEq/L   Glucose, Bld 69 (*) 70 - 99 mg/dL   BUN 12  6 - 23 mg/dL   Creat 7.82  9.56 - 2.13 mg/dL   Total Bilirubin 0.6  0.3 - 1.2 mg/dL   Alkaline Phosphatase 108  39 - 117 U/L   AST 19  0 - 37 U/L   ALT 27  0 - 53 U/L   Total Protein 6.9  6.0 - 8.3 g/dL   Albumin 4.3  3.5 - 5.2 g/dL   Calcium 08.6  8.4 - 57.8 mg/dL   GFR, Est African American 74     GFR, Est Non African American 64    POCT CBC      Result Value Range   WBC 8.6  4.6 - 10.2 K/uL   Lymph, poc 2.4  0.6 - 3.4   POC LYMPH PERCENT 28.1  10 - 50 %L   MID (cbc)    0 - 0.9   POC MID %    0 - 12 %M   POC Granulocyte 5.7  2 - 6.9   Granulocyte percent 66.1  37 - 80 %G   RBC 4.7  4.69 - 6.13 M/uL   Hemoglobin 14.8  14.1 - 18.1 g/dL   HCT, POC 46.9 (*) 62.9 - 53.7 %   MCV 88.7  80 - 97 fL   MCH, POC 31.7 (*) 27 - 31.2 pg   MCHC 35.8 (*) 31.8 - 35.4 g/dL   RDW, POC 52.8     Platelet Count, POC 287.0  142 - 424 K/uL   MPV 7.9  0 - 99.8 fL    ASSESSMENT: Pruritus - Plan: predniSONE  (DELTASONE) 20 MG tablet  HTN (hypertension)  Tobacco abuse  HLD (hyperlipidemia)  Gout BP going back up.  PLAN: Restart clonidine, atorvastatin.and amlodipine. He should have the indocin in case of flare up of Gout.  Meds ordered this encounter  Medications  . aspirin 81 MG tablet    Sig: Take 81 mg by mouth daily.  . predniSONE (DELTASONE) 20 MG tablet    Sig: Take 2 tablets (40 mg total) by mouth daily. For 3 days, then 2 tablets  Daily for 2 days, then 1 tablet daily for 2 days    Dispense:  10 tablet    Refill:  0   Smoking cessation counselled Compliance in medications and responsibilities of the patient to know his medications  Discussed.  Bring all medications at every visit.  Return in about 4 weeks (around 12/15/2012) for recheck BP. and  Consider  Recheck the uric  Acid, etc.  Ryenn Howeth P. Modesto Charon, M.D.

## 2012-11-25 ENCOUNTER — Telehealth: Payer: Self-pay | Admitting: Family Medicine

## 2012-11-25 NOTE — Telephone Encounter (Signed)
Spoke with pt c/o itching still continues . Advised to come to office in the am at 8am.

## 2012-11-26 ENCOUNTER — Ambulatory Visit (INDEPENDENT_AMBULATORY_CARE_PROVIDER_SITE_OTHER): Payer: Medicare HMO | Admitting: Family Medicine

## 2012-11-26 VITALS — BP 147/87 | HR 70 | Temp 97.0°F | Wt 195.8 lb

## 2012-11-26 DIAGNOSIS — I10 Essential (primary) hypertension: Secondary | ICD-10-CM

## 2012-11-26 DIAGNOSIS — L299 Pruritus, unspecified: Secondary | ICD-10-CM

## 2012-11-26 DIAGNOSIS — Z720 Tobacco use: Secondary | ICD-10-CM

## 2012-11-26 DIAGNOSIS — E785 Hyperlipidemia, unspecified: Secondary | ICD-10-CM

## 2012-11-26 DIAGNOSIS — F17200 Nicotine dependence, unspecified, uncomplicated: Secondary | ICD-10-CM

## 2012-11-26 MED ORDER — METHYLPREDNISOLONE ACETATE 80 MG/ML IJ SUSP
80.0000 mg | Freq: Once | INTRAMUSCULAR | Status: AC
  Administered 2012-11-26: 80 mg via INTRAMUSCULAR

## 2012-11-26 MED ORDER — CETIRIZINE HCL 10 MG PO TABS: 10.0000 mg | ORAL_TABLET | Freq: Every day | ORAL | Status: DC

## 2012-11-26 MED ORDER — RANITIDINE HCL 300 MG PO CAPS: 300.0000 mg | ORAL_CAPSULE | Freq: Every evening | ORAL | Status: DC

## 2012-11-26 MED ORDER — PREDNISONE 20 MG PO TABS: 40.0000 mg | ORAL_TABLET | Freq: Every day | ORAL | Status: DC

## 2012-11-26 NOTE — Patient Instructions (Signed)
Methylprednisolone Suspension for Injection What is this medicine? METHYLPREDNISOLONE (meth ill pred NISS oh lone) is a corticosteroid. It is commonly used to treat inflammation of the skin, joints, lungs, and other organs. Common conditions treated include asthma, allergies, and arthritis. It is also used for other conditions, such as blood disorders and diseases of the adrenal glands. This medicine may be used for other purposes; ask your health care provider or pharmacist if you have questions. What should I tell my health care provider before I take this medicine? They need to know if you have any of these conditions: -cataracts or glaucoma -Cushings -heart disease -high blood pressure -infection including tuberculosis -low calcium or potassium levels in the blood -recent surgery -seizures -stomach or intestinal disease, including colitis -threadworms -thyroid problems -an unusual or allergic reaction to methylprednisolone, corticosteroids, benzyl alcohol, other medicines, foods, dyes, or preservatives -pregnant or trying to get pregnant -breast-feeding How should I use this medicine? This medicine is for injection into a muscle, joint, or other tissue. It is given by a health care professional in a hospital or clinic setting. Talk to your pediatrician regarding the use of this medicine in children. While this drug may be prescribed for selected conditions, precautions do apply. Overdosage: If you think you have taken too much of this medicine contact a poison control center or emergency room at once. NOTE: This medicine is only for you. Do not share this medicine with others. What if I miss a dose? This does not apply. What may interact with this medicine? Do not take this medicine with any of the following medications: -mifepristone -radiopaque contrast agents This medicine may also interact with the following medications: -aspirin and aspirin-like  medicines -cyclosporin -ketoconazole -phenobarbital -phenytoin -rifampin -tacrolimus -troleandomycin -vaccines -warfarin This list may not describe all possible interactions. Give your health care provider a list of all the medicines, herbs, non-prescription drugs, or dietary supplements you use. Also tell them if you smoke, drink alcohol, or use illegal drugs. Some items may interact with your medicine. What should I watch for while using this medicine? Visit your doctor or health care professional for regular checks on your progress. If you are taking this medicine for a long time, carry an identification card with your name and address, the type and dose of your medicine, and your doctor's name and address. The medicine may increase your risk of getting an infection. Stay away from people who are sick. Tell your doctor or health care professional if you are around anyone with measles or chickenpox. You may need to avoid some vaccines. Talk to your health care provider for more information. If you are going to have surgery, tell your doctor or health care professional that you have taken this medicine within the last twelve months. Ask your doctor or health care professional about your diet. You may need to lower the amount of salt you eat. The medicine can increase your blood sugar. If you are a diabetic check with your doctor if you need help adjusting the dose of your diabetic medicine. What side effects may I notice from receiving this medicine? Side effects that you should report to your doctor or health care professional as soon as possible: -allergic reactions like skin rash, itching or hives, swelling of the face, lips, or tongue -bloody or tarry stools -changes in vision -eye pain or bulging eyes -fever, sore throat, sneezing, cough, or other signs of infection, wounds that will not heal -increased thirst -irregular heartbeat -muscle cramps -pain   in hips, back, ribs, arms,  shoulders, or legs -swelling of the ankles, feet, hands -trouble passing urine or change in the amount of urine -unusual bleeding or bruising -unusually weak or tired -weight gain or weight loss Side effects that usually do not require medical attention (report to your doctor or health care professional if they continue or are bothersome): -changes in emotions or moods -constipation or diarrhea -headache -irritation at site where injected -nausea, vomiting -skin problems, acne, thin and shiny skin -trouble sleeping -unusual hair growth on the face or body This list may not describe all possible side effects. Call your doctor for medical advice about side effects. You may report side effects to FDA at 1-800-FDA-1088. Where should I keep my medicine? This drug is given in a hospital or clinic and will not be stored at home. NOTE: This sheet is a summary. It may not cover all possible information. If you have questions about this medicine, talk to your doctor, pharmacist, or health care provider.  2013, [employer]. (11/16/2007 2:36:31 PM)  

## 2012-11-26 NOTE — Progress Notes (Signed)
Patient ID: NAMEER SUMMER, male   DOB: 06-08-1945, 67 y.o.   MRN: [id number] SUBJECTIVE: CC: Chief Complaint  Patient presents with  . Acute Visit    still itching  completed prednisone    HPI: Ongoing itching, fine bumps on iliac areas and the skin of the hands are peeling. No fever. See previous visits where patient attributes his itching to losartan. Not sure if he has any new detergent , soap or food in the house.  Past Medical History  Diagnosis Date  . Hypertension   . Arthritis   . Heart murmur   . Gout   . Hyperlipidemia    Past Surgical History  Procedure Laterality Date  . Finger surgery      Lt middle   . Hydrocele excision  02/12/2011    Procedure: HYDROCELECTOMY ADULT;  Surgeon: Ky Barban;  Location: [facility];  Service: Urology;  Laterality: Left;   History   Social History  . Marital Status: Married    Spouse Name: N/A    Number of Children: N/A  . Years of Education: N/A   Occupational History  . Not on file.   Social History Main Topics  . Smoking status: Current Every Day Smoker -- 0.50 packs/day for 45 years    Types: Cigarettes  . Smokeless tobacco: Not on file  . Alcohol Use: No  . Drug Use: No  . Sexually Active: Yes   Other Topics Concern  . Not on file   Social History Narrative  . No narrative on file   Family History  Problem Relation Age of Onset  . Hypertension Mother   . Hypotension Neg Hx   . Anesthesia problems Neg Hx   . Malignant hyperthermia Neg Hx   . Pseudochol deficiency Neg Hx    Current Outpatient Prescriptions on File Prior to Visit  Medication Sig Dispense Refill  . amLODipine (NORVASC) 10 MG tablet Take 1 tablet (10 mg total) by mouth daily.  30 tablet  5  . aspirin 81 MG tablet Take 81 mg by mouth daily.      Marland Kitchen atorvastatin (LIPITOR) 40 MG tablet Take 1 tablet (40 mg total) by mouth daily.  30 tablet  5  . cloNIDine (CATAPRES) 0.1 MG tablet One po qhs  30 tablet  3  . metoprolol (LOPRESSOR) 50 MG tablet  Take 1 tablet (50 mg total) by mouth 2 (two) times daily.  60 tablet  5   No current facility-administered medications on file prior to visit.   Allergies  Allergen Reactions  . Ace Inhibitors   . Losartan     itching    There is no immunization history on file for this patient. Prior to Admission medications   Medication Sig Start Date End Date Taking? Authorizing Provider  amLODipine (NORVASC) 10 MG tablet Take 1 tablet (10 mg total) by mouth daily. 09/21/12   Ileana Ladd, MD  aspirin 81 MG tablet Take 81 mg by mouth daily.    Historical Provider, MD  atorvastatin (LIPITOR) 40 MG tablet Take 1 tablet (40 mg total) by mouth daily. 09/21/12   Ileana Ladd, MD  cetirizine (ZYRTEC) 10 MG tablet Take 1 tablet (10 mg total) by mouth daily. 11/26/12   Ileana Ladd, MD  cloNIDine (CATAPRES) 0.1 MG tablet One po qhs 11/07/12   Deatra Canter, FNP  metoprolol (LOPRESSOR) 50 MG tablet Take 1 tablet (50 mg total) by mouth 2 (two) times daily. 09/21/12 09/21/13  Thelma Barge  Casimiro Needle, MD  predniSONE (DELTASONE) 20 MG tablet Take 2 tablets (40 mg total) by mouth daily. For 3 days, then 1 tablets  Daily for 3 days, then 1/2 tablet daily for 2 days 11/26/12   Ileana Ladd, MD  ranitidine (ZANTAC) 300 MG capsule Take 1 capsule (300 mg total) by mouth every evening. 11/26/12   Ileana Ladd, MD     Past Medical History  Diagnosis Date  . Hypertension   . Arthritis   . Heart murmur   . Gout   . Hyperlipidemia    Past Surgical History  Procedure Laterality Date  . Finger surgery      Lt middle   . Hydrocele excision  02/12/2011    Procedure: HYDROCELECTOMY ADULT;  Surgeon: Ky Barban;  Location: [facility];  Service: Urology;  Laterality: Left;   History   Social History  . Marital Status: Married    Spouse Name: N/A    Number of Children: N/A  . Years of Education: N/A   Occupational History  . Not on file.   Social History Main Topics  . Smoking status: Current Every Day Smoker --  0.50 packs/day for 45 years    Types: Cigarettes  . Smokeless tobacco: Not on file  . Alcohol Use: No  . Drug Use: No  . Sexually Active: Yes   Other Topics Concern  . Not on file   Social History Narrative  . No narrative on file   Family History  Problem Relation Age of Onset  . Hypertension Mother   . Hypotension Neg Hx   . Anesthesia problems Neg Hx   . Malignant hyperthermia Neg Hx   . Pseudochol deficiency Neg Hx    Current Outpatient Prescriptions on File Prior to Visit  Medication Sig Dispense Refill  . amLODipine (NORVASC) 10 MG tablet Take 1 tablet (10 mg total) by mouth daily.  30 tablet  5  . aspirin 81 MG tablet Take 81 mg by mouth daily.      Marland Kitchen atorvastatin (LIPITOR) 40 MG tablet Take 1 tablet (40 mg total) by mouth daily.  30 tablet  5  . cloNIDine (CATAPRES) 0.1 MG tablet One po qhs  30 tablet  3  . metoprolol (LOPRESSOR) 50 MG tablet Take 1 tablet (50 mg total) by mouth 2 (two) times daily.  60 tablet  5   No current facility-administered medications on file prior to visit.   Allergies  Allergen Reactions  . Ace Inhibitors   . Losartan     itching    There is no immunization history on file for this patient. Prior to Admission medications   Medication Sig Start Date End Date Taking? Authorizing Provider  amLODipine (NORVASC) 10 MG tablet Take 1 tablet (10 mg total) by mouth daily. 09/21/12   Ileana Ladd, MD  aspirin 81 MG tablet Take 81 mg by mouth daily.    Historical Provider, MD  atorvastatin (LIPITOR) 40 MG tablet Take 1 tablet (40 mg total) by mouth daily. 09/21/12   Ileana Ladd, MD  cetirizine (ZYRTEC) 10 MG tablet Take 1 tablet (10 mg total) by mouth daily. 11/26/12   Ileana Ladd, MD  cloNIDine (CATAPRES) 0.1 MG tablet One po qhs 11/07/12   Deatra Canter, FNP  metoprolol (LOPRESSOR) 50 MG tablet Take 1 tablet (50 mg total) by mouth 2 (two) times daily. 09/21/12 09/21/13  Ileana Ladd, MD  predniSONE (DELTASONE) 20 MG tablet Take 2 tablets  (  40 mg total) by mouth daily. For 3 days, then 1 tablets  Daily for 3 days, then 1/2 tablet daily for 2 days 11/26/12   Ileana Ladd, MD  ranitidine (ZANTAC) 300 MG capsule Take 1 capsule (300 mg total) by mouth every evening. 11/26/12   Ileana Ladd, MD   ROS: As above in the HPI. All other systems are stable or negative.  OBJECTIVE: APPEARANCE:  Patient in no acute distress.The patient appeared well nourished and normally developed. Acyanotic. Waist: VITAL SIGNS:BP 147/87  Pulse 70  Temp(Src) 97 F (36.1 C) (Oral)  Wt 195 lb 12.8 oz (88.814 kg)  BMI 28.09 kg/m2  AAM  SKIN: warm and  Dry with fine bumps on teh Sacro-iliac area on the right. Hand/palm skin scales mild Patient seems pururitic, scratching.  HEAD and Neck: without JVD, Head and scalp: normal Eyes:No scleral icterus. Fundi normal, eye movements normal. Ears: Auricle normal, canal normal, Tympanic membranes normal, insufflation normal. Nose: normal Throat: normal Neck & thyroid: normal  CHEST & LUNGS: Chest wall: normal Lungs: Clear  CVS: Reveals the PMI to be normally located. Regular rhythm, First and Second Heart sounds are normal,  absence of murmurs, rubs or gallops. Peripheral vasculature: Radial pulses: normal  ABDOMEN:  Appearance: normal Benign, no organomegaly, no masses, no Abdominal Aortic enlargement. No Guarding , no rebound. No Bruits. Bowel sounds: normal  RECTAL: N/A GU: N/A  EXTREMETIES: nonedematous.  MUSCULOSKELETAL:  Spine: normal Joints: intact  NEUROLOGIC: oriented to time,place and person; nonfocal. Results for orders placed in visit on 11/15/12  COMPLETE METABOLIC PANEL WITH GFR      Result Value Range   Sodium 141  135 - 145 mEq/L   Potassium 3.8  3.5 - 5.3 mEq/L   Chloride 104  96 - 112 mEq/L   CO2 27  19 - 32 mEq/L   Glucose, Bld 69 (*) 70 - 99 mg/dL   BUN 12  6 - 23 mg/dL   Creat 1.61  0.96 - 0.45 mg/dL   Total Bilirubin 0.6  0.3 - 1.2 mg/dL   Alkaline  Phosphatase 108  39 - 117 U/L   AST 19  0 - 37 U/L   ALT 27  0 - 53 U/L   Total Protein 6.9  6.0 - 8.3 g/dL   Albumin 4.3  3.5 - 5.2 g/dL   Calcium 40.9  8.4 - 81.1 mg/dL   GFR, Est African American 74     GFR, Est Non African American 64    POCT CBC      Result Value Range   WBC 8.6  4.6 - 10.2 K/uL   Lymph, poc 2.4  0.6 - 3.4   POC LYMPH PERCENT 28.1  10 - 50 %L   MID (cbc)    0 - 0.9   POC MID %    0 - 12 %M   POC Granulocyte 5.7  2 - 6.9   Granulocyte percent 66.1  37 - 80 %G   RBC 4.7  4.69 - 6.13 M/uL   Hemoglobin 14.8  14.1 - 18.1 g/dL   HCT, POC 91.4 (*) 78.2 - 53.7 %   MCV 88.7  80 - 97 fL   MCH, POC 31.7 (*) 27 - 31.2 pg   MCHC 35.8 (*) 31.8 - 35.4 g/dL   RDW, POC 95.6     Platelet Count, POC 287.0  142 - 424 K/uL   MPV 7.9  0 - 99.8 fL  ASSESSMENT: Pruritus - Plan: predniSONE (DELTASONE) 20 MG tablet, cetirizine (ZYRTEC) 10 MG tablet, ranitidine (ZANTAC) 300 MG capsule, methylPREDNISolone acetate (DEPO-MEDROL) injection 80 mg  Tobacco abuse  HLD (hyperlipidemia)  HTN (hypertension)  PLAN: Meds ordered this encounter  Medications  . predniSONE (DELTASONE) 20 MG tablet    Sig: Take 2 tablets (40 mg total) by mouth daily. For 3 days, then 1 tablets  Daily for 3 days, then 1/2 tablet daily for 2 days    Dispense:  10 tablet    Refill:  0  . cetirizine (ZYRTEC) 10 MG tablet    Sig: Take 1 tablet (10 mg total) by mouth daily.    Dispense:  30 tablet    Refill:  1  . ranitidine (ZANTAC) 300 MG capsule    Sig: Take 1 capsule (300 mg total) by mouth every evening.    Dispense:  30 capsule    Refill:  0  . methylPREDNISolone acetate (DEPO-MEDROL) injection 80 mg    Sig:    Patient to look at his household products and foods to ensure that it isn't a contact allergic reaction or food allergy. Will treat aggressively. Consideration as well is pityriasis rosea.  Return if symptoms worsen or fail to improve.  Semaya Vida P. [place] Charon, M.D.

## 2012-12-02 ENCOUNTER — Ambulatory Visit: Payer: Medicare HMO | Admitting: Family Medicine

## 2012-12-05 ENCOUNTER — Telehealth: Payer: Self-pay | Admitting: Family Medicine

## 2012-12-05 ENCOUNTER — Other Ambulatory Visit: Payer: Self-pay | Admitting: Family Medicine

## 2012-12-05 DIAGNOSIS — L299 Pruritus, unspecified: Secondary | ICD-10-CM

## 2012-12-05 NOTE — Telephone Encounter (Signed)
Done in EPIC 

## 2012-12-06 ENCOUNTER — Telehealth: Payer: Self-pay | Admitting: Family Medicine

## 2012-12-06 NOTE — Telephone Encounter (Signed)
Left message on pt voice mail that the referral has been initiated and could call later this evening to see what status is

## 2012-12-23 ENCOUNTER — Ambulatory Visit: Payer: Medicare HMO | Admitting: Family Medicine

## 2012-12-23 ENCOUNTER — Other Ambulatory Visit: Payer: Self-pay | Admitting: Family Medicine

## 2012-12-27 ENCOUNTER — Telehealth: Payer: Self-pay | Admitting: Family Medicine

## 2013-01-05 ENCOUNTER — Telehealth: Payer: Self-pay | Admitting: Family Medicine

## 2013-01-05 NOTE — Telephone Encounter (Signed)
Pt says still itching stated his girlfriend daughter had the scabies and he is concerned' Wants dr Modesto Charon to call dr Terri Piedra and get him in sooner

## 2013-01-05 NOTE — Telephone Encounter (Signed)
Pt notified and has appt Friday sept 5 2014 with dr Modesto Charon

## 2013-01-05 NOTE — Telephone Encounter (Signed)
It can wait. I did not see scabies .

## 2013-01-10 ENCOUNTER — Encounter: Payer: Self-pay | Admitting: Family Medicine

## 2013-01-10 ENCOUNTER — Encounter (INDEPENDENT_AMBULATORY_CARE_PROVIDER_SITE_OTHER): Payer: Medicare HMO | Admitting: Family Medicine

## 2013-01-10 ENCOUNTER — Ambulatory Visit (INDEPENDENT_AMBULATORY_CARE_PROVIDER_SITE_OTHER): Payer: Medicare HMO | Admitting: Family Medicine

## 2013-01-10 VITALS — BP 150/88 | HR 64 | Temp 97.7°F | Wt 193.0 lb

## 2013-01-10 DIAGNOSIS — Z2089 Contact with and (suspected) exposure to other communicable diseases: Secondary | ICD-10-CM

## 2013-01-10 DIAGNOSIS — L299 Pruritus, unspecified: Secondary | ICD-10-CM

## 2013-01-10 DIAGNOSIS — Z207 Contact with and (suspected) exposure to pediculosis, acariasis and other infestations: Secondary | ICD-10-CM | POA: Insufficient documentation

## 2013-01-10 MED ORDER — PERMETHRIN 5 % EX CREA: TOPICAL_CREAM | Freq: Once | CUTANEOUS | Status: DC

## 2013-01-10 NOTE — Progress Notes (Signed)
Patient ID: Gabriel Poole, male   DOB: [birth year], 67 y.o.   MRN: [id number] SUBJECTIVE: CC: Chief Complaint  Patient presents with  . Follow-up    c/o itching no better     HPI: His girlfriend has been itching and treated as scabies. His girlfriend's daughter is in prison and has scabies and supposedly contracted it from here during a visit.  Past Medical History  Diagnosis Date  . Hypertension   . Arthritis   . Heart murmur   . Gout   . Hyperlipidemia    Past Surgical History  Procedure Laterality Date  . Finger surgery      Lt middle   . Hydrocele excision  02/12/2011    Procedure: HYDROCELECTOMY ADULT;  Surgeon: Ky Barban;  Location: [facility];  Service: Urology;  Laterality: Left;   History   Social History  . Marital Status: Married    Spouse Name: N/A    Number of Children: N/A  . Years of Education: N/A   Occupational History  . Not on file.   Social History Main Topics  . Smoking status: Current Every Day Smoker -- 0.50 packs/day for 45 years    Types: Cigarettes  . Smokeless tobacco: Not on file  . Alcohol Use: No  . Drug Use: No  . Sexual Activity: Yes   Other Topics Concern  . Not on file   Social History Narrative  . No narrative on file   Family History  Problem Relation Age of Onset  . Hypertension Mother   . Hypotension Neg Hx   . Anesthesia problems Neg Hx   . Malignant hyperthermia Neg Hx   . Pseudochol deficiency Neg Hx    Current Outpatient Prescriptions on File Prior to Visit  Medication Sig Dispense Refill  . amLODipine (NORVASC) 10 MG tablet Take 1 tablet (10 mg total) by mouth daily.  30 tablet  5  . aspirin 81 MG tablet Take 81 mg by mouth daily.      Marland Kitchen atorvastatin (LIPITOR) 40 MG tablet Take 1 tablet (40 mg total) by mouth daily.  30 tablet  5  . cetirizine (ZYRTEC) 10 MG tablet Take 1 tablet (10 mg total) by mouth daily.  30 tablet  1  . cloNIDine (CATAPRES) 0.1 MG tablet One po qhs  30 tablet  3  . metoprolol  (LOPRESSOR) 50 MG tablet Take 1 tablet (50 mg total) by mouth 2 (two) times daily.  60 tablet  5  . ranitidine (ZANTAC) 300 MG tablet TAKE 1 TABLET BY MOUTH EVERY EVENING  30 tablet  4   No current facility-administered medications on file prior to visit.   Allergies  Allergen Reactions  . Ace Inhibitors   . Losartan     itching    There is no immunization history on file for this patient. Prior to Admission medications   Medication Sig Start Date End Date Taking? Authorizing Provider  amLODipine (NORVASC) 10 MG tablet Take 1 tablet (10 mg total) by mouth daily. 09/21/12   Ileana Ladd, MD  aspirin 81 MG tablet Take 81 mg by mouth daily.    Historical Provider, MD  atorvastatin (LIPITOR) 40 MG tablet Take 1 tablet (40 mg total) by mouth daily. 09/21/12   Ileana Ladd, MD  cetirizine (ZYRTEC) 10 MG tablet Take 1 tablet (10 mg total) by mouth daily. 11/26/12   Ileana Ladd, MD  cloNIDine (CATAPRES) 0.1 MG tablet One po qhs 11/07/12   Anselm Pancoast  [place], FNP  metoprolol (LOPRESSOR) 50 MG tablet Take 1 tablet (50 mg total) by mouth 2 (two) times daily. 09/21/12 09/21/13  Ileana Ladd, MD  permethrin (ELIMITE) 5 % cream Apply topically once. 01/10/13   Ileana Ladd, MD  ranitidine (ZANTAC) 300 MG tablet TAKE 1 TABLET BY MOUTH EVERY EVENING 12/23/12   Ernestina Penna, MD     ROS: As above in the HPI. All other systems are stable or negative.  OBJECTIVE: APPEARANCE:  Patient in no acute distress.The patient appeared well nourished and normally developed. Acyanotic. Waist: VITAL SIGNS:BP 150/88  Pulse 64  Temp(Src) 97.7 F (36.5 C) (Oral)  Wt 193 lb (87.544 kg)  BMI 27.69 kg/m2 AAM  SKIN: warm and  Dry without, tattoos and scars. He has scratch marks. No papules seen no granulomas. He does have some hyperpigmentation of his inner thighs.  HEAD and Neck: without JVD, Head and scalp: normal Eyes:No scleral icterus. Fundi normal, eye movements normal. Ears: Auricle normal, canal  normal, Tympanic membranes normal, insufflation normal. Nose: normal Throat: normal Neck & thyroid: normal  CHEST & LUNGS: Chest wall: normal Lungs: Clear  CVS: Reveals the PMI to be normally located. Regular rhythm, First and Second Heart sounds are normal,  absence of murmurs, rubs or gallops. Peripheral vasculature: Radial pulses: normal Dorsal pedis pulses: normal Posterior pulses: normal  ABDOMEN:  Appearance: normal Benign, no organomegaly, no masses, no Abdominal Aortic enlargement. No Guarding , no rebound. No Bruits. Bowel sounds: normal  RECTAL: N/A GU: N/A  EXTREMETIES: nonedematous.  MUSCULOSKELETAL:  Spine: normal Joints: intact  NEUROLOGIC: oriented to time,place and person; nonfocal. Strength is normal Sensory is normal Reflexes are normal Cranial Nerves are normal.  ASSESSMENT: Pruritus - Plan: permethrin (ELIMITE) 5 % cream  Exposure to scabies - Plan: permethrin (ELIMITE) 5 % cream  PLAN:  Will give him the benefit of the doubt and treat him for scabies but clinically doesn't look like scabies.  Meds ordered this encounter  Medications  . permethrin (ELIMITE) 5 % cream    Sig: Apply topically once.    Dispense:  60 g    Refill:  0    Handout in the AVS to be given at check out.  Return if symptoms worsen or fail to improve.  Brode Sculley P. [place] Charon, M.D.

## 2013-01-10 NOTE — Progress Notes (Deleted)
Patient ID: Gabriel Poole, male   DOB: 02-Mar-1946, 67 y.o.   MRN: [id number] SUBJECTIVE: CC:  HPI:   ROS: As above in the HPI. All other systems are stable or negative.  OBJECTIVE: APPEARANCE:  Patient in no acute distress.The patient appeared well nourished and normally developed. Acyanotic. Waist: VITAL SIGNS:  SKIN: warm and  Dry without overt rashes, tattoos and scars  HEAD and Neck: without JVD, Head and scalp: normal Eyes:No scleral icterus. Fundi normal, eye movements normal. Ears: Auricle normal, canal normal, Tympanic membranes normal, insufflation normal. Nose: normal Throat: normal Neck & thyroid: normal  CHEST & LUNGS: Chest wall: normal Lungs: Clear  CVS: Reveals the PMI to be normally located. Regular rhythm, First and Second Heart sounds are normal,  absence of murmurs, rubs or gallops. Peripheral vasculature: Radial pulses: normal Dorsal pedis pulses: normal Posterior pulses: normal  ABDOMEN:  Appearance: normal Benign, no organomegaly, no masses, no Abdominal Aortic enlargement. No Guarding , no rebound. No Bruits. Bowel sounds: normal  RECTAL: N/A GU: N/A  EXTREMETIES: nonedematous.  MUSCULOSKELETAL:  Spine: normal Joints: intact  NEUROLOGIC: oriented to time,place and person; nonfocal. Strength is normal Sensory is normal Reflexes are normal Cranial Nerves are normal.  ASSESSMENT:  PLAN:

## 2013-01-10 NOTE — Patient Instructions (Signed)
Scabies Scabies are small bugs (mites) that burrow under the skin and cause red bumps and severe itching. These bugs can only be seen with a microscope. Scabies are highly contagious. They can spread easily from person to person by direct contact. They are also spread through sharing clothing or linens that have the scabies mites living in them. It is not unusual for an entire family to become infected through shared towels, clothing, or bedding.  HOME CARE INSTRUCTIONS   Your caregiver may prescribe a cream or lotion to kill the mites. If cream is prescribed, massage the cream into the entire body from the neck to the bottom of both feet. Also massage the cream into the scalp and face if your child is less than 26 year old. Avoid the eyes and mouth. Do not wash your hands after application.  Leave the cream on for 8 to 12 hours. Your child should bathe or shower after the 8 to 12 hour application period. Sometimes it is helpful to apply the cream to your child right before bedtime.  One treatment is usually effective and will eliminate approximately 95% of infestations. For severe cases, your caregiver may decide to repeat the treatment in 1 week. Everyone in your household should be treated with one application of the cream.  New rashes or burrows should not appear within 24 to 48 hours after successful treatment. However, the itching and rash may last for 2 to 4 weeks after successful treatment. Your caregiver may prescribe a medicine to help with the itching or to help the rash go away more quickly.  Scabies can live on clothing or linens for up to 3 days. All of your child's recently used clothing, towels, stuffed toys, and bed linens should be washed in hot water and then dried in a dryer for at least 20 minutes on high heat. Items that cannot be washed should be enclosed in a plastic bag for at least 3 days.  To help relieve itching, bathe your child in a cool bath or apply cool washcloths to the  affected areas.  Your child may return to school after treatment with the prescribed cream. SEEK MEDICAL CARE IF:   The itching persists longer than 4 weeks after treatment.  The rash spreads or becomes infected. Signs of infection include red blisters or yellow-tan crust. Document Released: 04/27/2005 Document Revised: 07/20/2011 Document Reviewed: 09/05/2008 [facility] Patient Information 2014 [place], [place].  Smoking Cessation Quitting smoking is important to your health and has many advantages. However, it is not always easy to quit since nicotine is a very addictive drug. Often times, people try 3 times or more before being able to quit. This document explains the best ways for you to prepare to quit smoking. Quitting takes hard work and a lot of effort, but you can do it. ADVANTAGES OF QUITTING SMOKING  You will live longer, feel better, and live better.  Your body will feel the impact of quitting smoking almost immediately.  Within 20 minutes, blood pressure decreases. Your pulse returns to its normal level.  After 8 hours, carbon monoxide levels in the blood return to normal. Your oxygen level increases.  After 24 hours, the chance of having a heart attack starts to decrease. Your breath, hair, and body stop smelling like smoke.  After 48 hours, damaged nerve endings begin to recover. Your sense of taste and smell improve.  After 72 hours, the body is virtually free of nicotine. Your bronchial tubes relax and breathing becomes  easier.  After 2 to 12 weeks, lungs can hold more air. Exercise becomes easier and circulation improves.  The risk of having a heart attack, stroke, cancer, or lung disease is greatly reduced.  After 1 year, the risk of coronary heart disease is cut in half.  After 5 years, the risk of stroke falls to the same as a nonsmoker.  After 10 years, the risk of lung cancer is cut in half and the risk of other cancers decreases significantly.  After 15  years, the risk of coronary heart disease drops, usually to the level of a nonsmoker.  If you are pregnant, quitting smoking will improve your chances of having a healthy baby.  The people you live with, especially any children, will be healthier.  You will have extra money to spend on things other than cigarettes. QUESTIONS TO THINK ABOUT BEFORE ATTEMPTING TO QUIT You may want to talk about your answers with your caregiver.  Why do you want to quit?  If you tried to quit in the past, what helped and what did not?  What will be the most difficult situations for you after you quit? How will you plan to handle them?  Who can help you through the tough times? Your family? Friends? A caregiver?  What pleasures do you get from smoking? What ways can you still get pleasure if you quit? Here are some questions to ask your caregiver:  How can you help me to be successful at quitting?  What medicine do you think would be best for me and how should I take it?  What should I do if I need more help?  What is smoking withdrawal like? How can I get information on withdrawal? GET READY  Set a quit date.  Change your environment by getting rid of all cigarettes, ashtrays, matches, and lighters in your home, car, or work. Do not let people smoke in your home.  Review your past attempts to quit. Think about what worked and what did not. GET SUPPORT AND ENCOURAGEMENT You have a better chance of being successful if you have help. You can get support in many ways.  Tell your family, friends, and co-workers that you are going to quit and need their support. Ask them not to smoke around you.  Get individual, group, or telephone counseling and support. Programs are available at [employer] and health centers. Call your local health department for information about programs in your area.  Spiritual beliefs and practices may help some smokers quit.  Download a "quit meter" on your computer to  keep track of quit statistics, such as how long you have gone without smoking, cigarettes not smoked, and money saved.  Get a self-help book about quitting smoking and staying off of tobacco. LEARN NEW SKILLS AND BEHAVIORS  Distract yourself from urges to smoke. Talk to someone, go for a walk, or occupy your time with a task.  Change your normal routine. Take a different route to work. Drink tea instead of coffee. Eat breakfast in a different place.  Reduce your stress. Take a hot bath, exercise, or read a book.  Plan something enjoyable to do every day. Reward yourself for not smoking.  Explore interactive web-based programs that specialize in helping you quit. GET MEDICINE AND USE IT CORRECTLY Medicines can help you stop smoking and decrease the urge to smoke. Combining medicine with the above behavioral methods and support can greatly increase your chances of successfully quitting smoking.  Nicotine replacement  therapy helps deliver nicotine to your body without the negative effects and risks of smoking. Nicotine replacement therapy includes nicotine gum, lozenges, inhalers, nasal sprays, and skin patches. Some may be available over-the-counter and others require a prescription.  Antidepressant medicine helps people abstain from smoking, but how this works is unknown. This medicine is available by prescription.  Nicotinic receptor partial agonist medicine simulates the effect of nicotine in your brain. This medicine is available by prescription. Ask your caregiver for advice about which medicines to use and how to use them based on your health history. Your caregiver will tell you what side effects to look out for if you choose to be on a medicine or therapy. Carefully read the information on the package. Do not use any other product containing nicotine while using a nicotine replacement product.  RELAPSE OR DIFFICULT SITUATIONS Most relapses occur within the first 3 months after quitting.  Do not be discouraged if you start smoking again. Remember, most people try several times before finally quitting. You may have symptoms of withdrawal because your body is used to nicotine. You may crave cigarettes, be irritable, feel very hungry, cough often, get headaches, or have difficulty concentrating. The withdrawal symptoms are only temporary. They are strongest when you first quit, but they will go away within 10 14 days. To reduce the chances of relapse, try to:  Avoid drinking alcohol. Drinking lowers your chances of successfully quitting.  Reduce the amount of caffeine you consume. Once you quit smoking, the amount of caffeine in your body increases and can give you symptoms, such as a rapid heartbeat, sweating, and anxiety.  Avoid smokers because they can make you want to smoke.  Do not let weight gain distract you. Many smokers will gain weight when they quit, usually less than 10 pounds. Eat a healthy diet and stay active. You can always lose the weight gained after you quit.  Find ways to improve your mood other than smoking. FOR MORE INFORMATION  www.smokefree.gov  Document Released: 04/21/2001 Document Revised: 10/27/2011 Document Reviewed: 08/06/2011 [facility] Patient Information 2014 [place], [place].

## 2013-01-11 ENCOUNTER — Telehealth: Payer: Self-pay | Admitting: Family Medicine

## 2013-01-11 NOTE — Telephone Encounter (Signed)
Gabriel Poole to address

## 2013-01-13 ENCOUNTER — Telehealth: Payer: Self-pay | Admitting: Family Medicine

## 2013-01-13 ENCOUNTER — Ambulatory Visit: Payer: Medicare HMO | Admitting: Family Medicine

## 2013-01-13 NOTE — Telephone Encounter (Signed)
Spoke with pt girlfriend and since neither knows what meds they want --it was advised to notify [facility] and let them request electronically his refills.

## 2013-01-16 ENCOUNTER — Other Ambulatory Visit: Payer: Self-pay | Admitting: Family Medicine

## 2013-01-17 ENCOUNTER — Telehealth: Payer: Self-pay | Admitting: Family Medicine

## 2013-01-17 NOTE — Telephone Encounter (Signed)
Pt aware that dr Christell Constant is not taking on more/new pts.

## 2013-01-18 NOTE — Telephone Encounter (Signed)
He was to use the cream overnight and wash off the next day. He was to launder all the sheets, pillow cases, etc in hot water. And he was to keep the appintment with the dermatologist.

## 2013-01-19 NOTE — Telephone Encounter (Signed)
Pt notified and no further questions .

## 2013-04-13 ENCOUNTER — Encounter: Payer: Medicare HMO | Admitting: Family Medicine

## 2013-04-28 ENCOUNTER — Ambulatory Visit: Payer: Medicare HMO | Admitting: Family Medicine

## 2013-07-09 ENCOUNTER — Other Ambulatory Visit: Payer: Self-pay | Admitting: Family Medicine

## 2013-07-10 ENCOUNTER — Other Ambulatory Visit: Payer: Self-pay | Admitting: *Deleted

## 2013-07-10 DIAGNOSIS — E785 Hyperlipidemia, unspecified: Secondary | ICD-10-CM

## 2013-07-12 ENCOUNTER — Telehealth: Payer: Self-pay | Admitting: Family Medicine

## 2013-07-12 ENCOUNTER — Other Ambulatory Visit: Payer: Self-pay | Admitting: *Deleted

## 2013-07-12 DIAGNOSIS — E785 Hyperlipidemia, unspecified: Secondary | ICD-10-CM

## 2013-07-12 DIAGNOSIS — I10 Essential (primary) hypertension: Secondary | ICD-10-CM

## 2013-07-12 MED ORDER — METOPROLOL TARTRATE 50 MG PO TABS: 50.0000 mg | ORAL_TABLET | Freq: Two times a day (BID) | ORAL | Status: DC

## 2013-07-12 MED ORDER — ATORVASTATIN CALCIUM 40 MG PO TABS: 40.0000 mg | ORAL_TABLET | Freq: Every day | ORAL | Status: DC

## 2013-07-12 MED ORDER — AMLODIPINE BESYLATE 10 MG PO TABS: 10.0000 mg | ORAL_TABLET | Freq: Every day | ORAL | Status: DC

## 2013-07-12 NOTE — Telephone Encounter (Signed)
Patient came into the clinic requesting and appt to refill his medications. No available appts today or tomorrow for chronic medical conditions. One month of refills provided and patient is to schedule an appt within the month.  He agreed to plan.

## 2013-07-13 ENCOUNTER — Other Ambulatory Visit: Payer: Self-pay | Admitting: Family Medicine

## 2013-07-24 ENCOUNTER — Encounter: Payer: Self-pay | Admitting: Family Medicine

## 2013-07-24 ENCOUNTER — Ambulatory Visit (INDEPENDENT_AMBULATORY_CARE_PROVIDER_SITE_OTHER): Payer: Medicare HMO | Admitting: Family Medicine

## 2013-07-24 VITALS — BP 124/79 | HR 62 | Temp 97.4°F | Ht 70.0 in | Wt 196.0 lb

## 2013-07-24 DIAGNOSIS — Z125 Encounter for screening for malignant neoplasm of prostate: Secondary | ICD-10-CM

## 2013-07-24 DIAGNOSIS — F1011 Alcohol abuse, in remission: Secondary | ICD-10-CM

## 2013-07-24 DIAGNOSIS — I10 Essential (primary) hypertension: Secondary | ICD-10-CM

## 2013-07-24 DIAGNOSIS — Z23 Encounter for immunization: Secondary | ICD-10-CM

## 2013-07-24 DIAGNOSIS — N529 Male erectile dysfunction, unspecified: Secondary | ICD-10-CM

## 2013-07-24 DIAGNOSIS — Z1211 Encounter for screening for malignant neoplasm of colon: Secondary | ICD-10-CM

## 2013-07-24 LAB — POCT CBC
GRANULOCYTE PERCENT: 64 % (ref 37–80)
HCT, POC: 41.5 % — AB (ref 43.5–53.7)
Hemoglobin: 12.9 g/dL — AB (ref 14.1–18.1)
Lymph, poc: 2.7 (ref 0.6–3.4)
MCH, POC: 27.5 pg (ref 27–31.2)
MCHC: 31.2 g/dL — AB (ref 31.8–35.4)
MCV: 88.2 fL (ref 80–97)
MPV: 7.9 fL (ref 0–99.8)
POC Granulocyte: 5 (ref 2–6.9)
POC LYMPH PERCENT: 34.7 %L (ref 10–50)
Platelet Count, POC: 289 10*3/uL (ref 142–424)
RBC: 4.7 M/uL (ref 4.69–6.13)
RDW, POC: 14.6 %
WBC: 7.8 10*3/uL (ref 4.6–10.2)

## 2013-07-24 LAB — POCT GLYCOSYLATED HEMOGLOBIN (HGB A1C)

## 2013-07-24 MED ORDER — TADALAFIL 20 MG PO TABS: 10.0000 mg | ORAL_TABLET | ORAL | Status: DC | PRN

## 2013-07-24 NOTE — Patient Instructions (Addendum)
Pneumococcal Conjugate Vaccine What You Need to Know Your doctor recommends that you, or your child, get a dose of PCV13 vaccine today. WHY GET VACCINATED? Pneumococcal conjugate vaccine (called PCV13 or Prevnar 13) is recommended to protect infants and toddlers, and some older children and adults with certain health conditions, from pneumococcal disease. Pneumococcal disease is caused by infection with Streptococcus pneumoniae bacteria. These bacteria can spread from person to person through close contact. Pneumococcal disease can lead to severe health problems, including pneumonia, blood infections, and meningitis. Meningitis is an infection of the covering of the brain. Pneumococcal meningitis is fairly rare (less than 1 case per 100,000 people each year), but it leads to other health problems, including deafness and brain damage. In children, it is fatal in about 1 case out of 10. Children younger than two are at higher risk for serious disease than older children. People with certain medical conditions, people over age 12, and cigarette smokers are also at higher risk. Before vaccine, pneumococcal infections caused many problems each year in the [place] in children younger than 5, including:  more than 700 cases of meningitis,  13,000 blood infections,  about 5 million ear infections, and  about 200 deaths. About 4,000 adults still die each year because of pneumococcal infections. Pneumococcal infections can be hard to treat because some strains are resistant to antibiotics. This makes prevention through vaccination even more important. PCV13 VACCINE There are more than 90 types of pneumococcal bacteria. PCV13 protects against 13 of them. These 13 strains cause most severe infections in children and about half of infections in adults.  PCV13 is routinely given to children at 2, 4, 6, and 47 37 months of age. Children in this age range are at greatest risk for serious diseases caused  by pneumococcal infection. PCV13 vaccine may also be recommended for some older children or adults. Your doctor can give you details. A second type of pneumococcal vaccine, called PPSV23, may also be given to some children and adults, including anyone over age 60. There is a separate Vaccine Information Statement for this vaccine. PRECAUTIONS  Anyone who has ever had a life-threatening allergic reaction to a dose of this vaccine, to an earlier pneumococcal vaccine called PCV7 (or Prevnar), or to any vaccine containing diphtheria toxoid (for example, DTaP), should not get PCV13. Anyone with a severe allergy to any component of PCV13 should not get the vaccine. Tell your doctor if the person being vaccinated has any severe allergies. If the person scheduled for vaccination is sick, your doctor might decide to reschedule the shot on another day. Your doctor can give you more information about any of these precautions. RISKS  With any medicine, including vaccines, there is a chance of side effects. These are usually mild and go away on their own, but serious reactions are also possible. Reported problems associated with PCV13 vary by dose and age, but generally:  About half of children became drowsy after the shot, had a temporary loss of appetite, or had redness or tenderness where the shot was given.  About 1 out of 3 had swelling where the shot was given.  About 1 out of 3 had a mild fever, and about 1 in 20 had a higher fever (over 102.2 F or 39 C).  Up to about 8 out of 10 became fussy or irritable. Adults receiving the vaccine have reported redness, pain, and swelling where the shot was given. Mild fever, fatigue, headache, chills, or muscle pain have also  been reported. Life-threatening allergic reactions from any vaccine are very rare. WHAT IF THERE IS A SERIOUS REACTION? What should I look for? Look for anything that concerns you, such as signs of a severe allergic reaction, very high  fever, or behavior changes. Signs of a severe allergic reaction can include hives, swelling of the face and throat, difficulty breathing, a fast heartbeat, dizziness, and weakness. These would start a few minutes to a few hours after the vaccination. What should I do?  If you think it is a severe allergic reaction or other emergency that can't wait, get the person to the nearest hospital or call 9-1-1. Otherwise, call your doctor.  Afterward, the reaction should be reported to the "Vaccine Adverse Event Reporting System" (VAERS). Your doctor might file this report, or you can do it yourself through the VAERS web site at www.vaers.SamedayNews.es, or by calling 724-283-5400. VAERS is only for reporting reactions. They do not give medical advice. THE NATIONAL VACCINE INJURY COMPENSATION PROGRAM The National Vaccine Injury Compensation Program (VICP) was created in 1986. Persons who believe they may have been injured by a vaccine can learn about the program and about filing a claim by calling 228-086-8218 or visiting the [place] website at GoldCloset.com.ee. HOW CAN I LEARN MORE?  Ask your doctor.  Call your local or state health department.  Contact the Centers for Disease Control and Prevention (CDC):  Call 539 423 7549 (1-800-CDC-INFO) or  Visit CDC's website at http://hunter.com/ CDC PCV13 Vaccine VIS (Interim) (07/08/11) Document Released: 02/22/2006 Document Revised: 08/22/2012 Document Reviewed: 08/17/2012 [facility] Patient Information 2014 [place].  Consider Prevnar, Zostavax, and Tdap

## 2013-07-24 NOTE — Progress Notes (Signed)
Discussed need for Zostavax, Tdap, and Prevnar. Prevnar is covered by insurance at no cost to patient. Today's cost to patient for Zostavax and Tdap at $6.60 each. Patient would like to wait on all of these vaccines. He is aware that the cost may go up.

## 2013-07-24 NOTE — Progress Notes (Signed)
   Subjective:    Patient ID: Gabriel Poole, male    DOB: [birth year], 68 y.o.   MRN: [id number]  HPI Pt presents today for general follow up visit.  No acute issues or concerns.  Noted to not be up to date on immunizations.  No colonoscopy. Baseline HTN. Denies any CP, SOB. Diaphoresis.  No checking BP at home.  Also with previous heavy ETOH abuse. Says he quit 3 monhts ago. Has ben drinking on and off for the past 40 years. Usually drinks 1 pint a day when he drinks.  Previous smoker.     Review of Systems  All other systems reviewed and are negative.       Objective:   Physical Exam  Constitutional: He appears well-developed and well-nourished.  HENT:  Head: Normocephalic and atraumatic.  Eyes: Conjunctivae are normal. Pupils are equal, round, and reactive to light.  Neck: Normal range of motion. Neck supple.  Cardiovascular: Normal rate and regular rhythm.   Pulmonary/Chest: Effort normal and breath sounds normal.  Abdominal: Soft. Bowel sounds are normal. He exhibits no distension. There is no tenderness.  Musculoskeletal: Normal range of motion.  Neurological: He is alert.  Skin: Skin is warm.   BP 124/79  Pulse 62  Temp(Src) 97.4 F (36.3 C) (Oral)  Ht 5\' 10"  (1.778 m)  Wt 196 lb (88.905 kg)  BMI 28.12 kg/m2        Assessment & Plan:  HTN (hypertension) - Plan: NMR, lipoprofile, POCT CBC, Comprehensive metabolic panel, POCT glycosylated hemoglobin (Hb A1C)  H/O ETOH abuse - Plan: Ammonia  Screening for prostate cancer - Plan: PSA, total and free  Erectile dysfunction - Plan: tadalafil (CIALIS) 20 MG tablet  Will recheck risk stratification labs.  Watch BP at home.  Avoid ETOH. Check ammonia level.  Start on PPI for GI ppx.  Check PSA.  PSA screening.  Colonoscopy and immunization info given.  Cialis for ED at pt' request.

## 2013-07-25 LAB — COMPREHENSIVE METABOLIC PANEL
ALT: 26 IU/L (ref 0–44)
AST: 18 IU/L (ref 0–40)
Albumin/Globulin Ratio: 2.4 (ref 1.1–2.5)
Albumin: 4.8 g/dL (ref 3.6–4.8)
Alkaline Phosphatase: 103 IU/L (ref 39–117)
BUN/Creatinine Ratio: 20 (ref 10–22)
BUN: 24 mg/dL (ref 8–27)
CALCIUM: 10 mg/dL (ref 8.6–10.2)
CHLORIDE: 107 mmol/L (ref 97–108)
CO2: 23 mmol/L (ref 18–29)
Creatinine, Ser: 1.21 mg/dL (ref 0.76–1.27)
GFR calc Af Amer: 71 mL/min/{1.73_m2} (ref >59)
GFR calc non Af Amer: 62 mL/min/{1.73_m2} (ref >59)
Globulin, Total: 2 g/dL (ref 1.5–4.5)
Glucose: 76 mg/dL (ref 65–99)
POTASSIUM: 4.1 mmol/L (ref 3.5–5.2)
SODIUM: 145 mmol/L — AB (ref 134–144)
Total Bilirubin: 0.9 mg/dL (ref 0.0–1.2)
Total Protein: 6.8 g/dL (ref 6.0–8.5)

## 2013-07-25 LAB — NMR, LIPOPROFILE
Cholesterol: 123 mg/dL (ref <200)
HDL CHOLESTEROL BY NMR: 45 mg/dL (ref >=40)
HDL Particle Number: 27.5 umol/L — ABNORMAL LOW (ref >=30.5)
LDL Particle Number: 880 nmol/L (ref <1000)
LDL Size: 20.3 nm — ABNORMAL LOW (ref >20.5)
LDLC SERPL CALC-MCNC: 64 mg/dL (ref <100)
LP-IR Score: 44 (ref <=45)
SMALL LDL PARTICLE NUMBER: 458 nmol/L (ref <=527)
Triglycerides by NMR: 68 mg/dL (ref <150)

## 2013-07-25 LAB — PSA, TOTAL AND FREE
PSA FREE: 1.37 ng/mL
PSA, Free Pct: 21.1 %
PSA: 6.5 ng/mL — ABNORMAL HIGH (ref 0.0–4.0)

## 2013-07-25 LAB — AMMONIA: Ammonia: 63 ug/dL (ref 27–102)

## 2013-07-31 ENCOUNTER — Telehealth: Payer: Self-pay | Admitting: Family Medicine

## 2013-08-02 ENCOUNTER — Other Ambulatory Visit: Payer: Self-pay | Admitting: *Deleted

## 2013-08-02 ENCOUNTER — Telehealth: Payer: Self-pay | Admitting: *Deleted

## 2013-08-02 DIAGNOSIS — R972 Elevated prostate specific antigen [PSA]: Secondary | ICD-10-CM

## 2013-08-02 NOTE — Telephone Encounter (Signed)
Dr. Ernestina Patches, cialis was denied because medicare says"under section [phone] 81f the social security act certain drugs are not covered part d drugs when used to treat certain medical conditions" Cialis 20mg  tab is one of the drugs that's not covered. Don't know where we go from here, if I need to do anything else let me know.

## 2013-08-02 NOTE — Telephone Encounter (Signed)
Discussed with patient

## 2013-08-07 ENCOUNTER — Other Ambulatory Visit: Payer: Self-pay | Admitting: Family Medicine

## 2013-08-18 ENCOUNTER — Other Ambulatory Visit: Payer: Self-pay | Admitting: Family Medicine

## 2013-08-18 NOTE — Telephone Encounter (Signed)
Patient also has fax from pharmacy requesting a refill on indomethacin 75mg . Take 1 capsule po every 8 to 12 hrs prn gout pain. Do not see on current med list. Please advise

## 2013-08-21 ENCOUNTER — Telehealth: Payer: Self-pay | Admitting: *Deleted

## 2013-08-21 NOTE — Telephone Encounter (Signed)
[facility] sent fax requesting refill on indomethacin 75mg . I capsule po q8-12hrs as needed for gout pain. Do not see on current med list. Please advise

## 2013-08-21 NOTE — Telephone Encounter (Signed)
Patient has been discharged  Refill denied.

## 2013-09-18 ENCOUNTER — Other Ambulatory Visit: Payer: Self-pay | Admitting: Family Medicine

## 2013-10-27 ENCOUNTER — Other Ambulatory Visit: Payer: Self-pay | Admitting: *Deleted

## 2014-01-02 ENCOUNTER — Telehealth: Payer: Self-pay | Admitting: Family Medicine

## 2014-01-02 ENCOUNTER — Encounter: Payer: Self-pay | Admitting: Family Medicine

## 2014-01-02 ENCOUNTER — Ambulatory Visit (INDEPENDENT_AMBULATORY_CARE_PROVIDER_SITE_OTHER): Payer: Medicare HMO | Admitting: Family Medicine

## 2014-01-02 VITALS — BP 166/82 | HR 63 | Temp 97.6°F | Ht 70.0 in | Wt 191.4 lb

## 2014-01-02 DIAGNOSIS — M549 Dorsalgia, unspecified: Secondary | ICD-10-CM

## 2014-01-02 DIAGNOSIS — M10071 Idiopathic gout, right ankle and foot: Secondary | ICD-10-CM

## 2014-01-02 DIAGNOSIS — I10 Essential (primary) hypertension: Secondary | ICD-10-CM

## 2014-01-02 DIAGNOSIS — M109 Gout, unspecified: Secondary | ICD-10-CM

## 2014-01-02 DIAGNOSIS — R972 Elevated prostate specific antigen [PSA]: Secondary | ICD-10-CM

## 2014-01-02 DIAGNOSIS — N528 Other male erectile dysfunction: Secondary | ICD-10-CM

## 2014-01-02 DIAGNOSIS — N529 Male erectile dysfunction, unspecified: Secondary | ICD-10-CM

## 2014-01-02 DIAGNOSIS — E785 Hyperlipidemia, unspecified: Secondary | ICD-10-CM

## 2014-01-02 LAB — POCT CBC
Granulocyte percent: 64.4 %G (ref 37–80)
HCT, POC: 45.3 % (ref 43.5–53.7)
Hemoglobin: 14.8 g/dL (ref 14.1–18.1)
Lymph, poc: 2.7 (ref 0.6–3.4)
MCH, POC: 29.1 pg (ref 27–31.2)
MCHC: 32.6 g/dL (ref 31.8–35.4)
MCV: 89.3 fL (ref 80–97)
MPV: 9 fL (ref 0–99.8)
POC Granulocyte: 5.5 (ref 2–6.9)
POC LYMPH PERCENT: 31.6 %L (ref 10–50)
Platelet Count, POC: 222 10*3/uL (ref 142–424)
RBC: 5.1 M/uL (ref 4.69–6.13)
RDW, POC: 13.7 %
WBC: 8.5 10*3/uL (ref 4.6–10.2)

## 2014-01-02 MED ORDER — HYDROCODONE-ACETAMINOPHEN 5-325 MG PO TABS: 1.0000 | ORAL_TABLET | Freq: Four times a day (QID) | ORAL | Status: DC | PRN

## 2014-01-02 MED ORDER — ATORVASTATIN CALCIUM 40 MG PO TABS: 40.0000 mg | ORAL_TABLET | Freq: Every day | ORAL | Status: DC

## 2014-01-02 MED ORDER — INDOMETHACIN 50 MG PO CAPS: 50.0000 mg | ORAL_CAPSULE | Freq: Three times a day (TID) | ORAL | Status: DC

## 2014-01-02 MED ORDER — METOPROLOL TARTRATE 50 MG PO TABS: 50.0000 mg | ORAL_TABLET | Freq: Two times a day (BID) | ORAL | Status: DC

## 2014-01-02 MED ORDER — TADALAFIL 20 MG PO TABS: 10.0000 mg | ORAL_TABLET | ORAL | Status: DC | PRN

## 2014-01-03 LAB — CMP14+EGFR
ALT: 14 IU/L (ref 0–44)
AST: 18 IU/L (ref 0–40)
Albumin/Globulin Ratio: 2 (ref 1.1–2.5)
Albumin: 4.5 g/dL (ref 3.6–4.8)
Alkaline Phosphatase: 102 IU/L (ref 39–117)
BUN/Creatinine Ratio: 13 (ref 10–22)
BUN: 15 mg/dL (ref 8–27)
CO2: 25 mmol/L (ref 18–29)
Calcium: 10.1 mg/dL (ref 8.6–10.2)
Chloride: 101 mmol/L (ref 97–108)
Creatinine, Ser: 1.13 mg/dL (ref 0.76–1.27)
GFR calc Af Amer: 77 mL/min/{1.73_m2} (ref >59)
GFR calc non Af Amer: 67 mL/min/{1.73_m2} (ref >59)
Globulin, Total: 2.2 g/dL (ref 1.5–4.5)
Glucose: 80 mg/dL (ref 65–99)
Potassium: 3.4 mmol/L — ABNORMAL LOW (ref 3.5–5.2)
Sodium: 144 mmol/L (ref 134–144)
Total Bilirubin: 0.5 mg/dL (ref 0.0–1.2)
Total Protein: 6.7 g/dL (ref 6.0–8.5)

## 2014-01-03 LAB — URIC ACID: Uric Acid: 6.6 mg/dL (ref 3.7–8.6)

## 2014-01-03 NOTE — Telephone Encounter (Signed)
Pt said he was taking Amlodipine 10mg  qd. Last refill 09/27/13. I verified with [facility]. Med not in [place]. Pt said he needs this med refilled and you were going to start him on a new prostate med. He wants these sent to [facility]. Thanks.

## 2014-01-04 ENCOUNTER — Other Ambulatory Visit: Payer: Self-pay | Admitting: Family Medicine

## 2014-01-04 DIAGNOSIS — I10 Essential (primary) hypertension: Secondary | ICD-10-CM

## 2014-01-04 MED ORDER — AMLODIPINE BESYLATE 10 MG PO TABS: 10.0000 mg | ORAL_TABLET | Freq: Every day | ORAL | Status: DC

## 2014-01-04 NOTE — Telephone Encounter (Signed)
Patient amlodipine was sent today to pharmacy.  He has elevated PSA and sees urology but is not sure what they did for him or if he needs prostate medicine.  I would rather have him treated by urology for this.  I have put in referral for urology to see him again to address the elevated PSA and to address his nocturia

## 2014-01-04 NOTE — Telephone Encounter (Signed)
Pt notified Verbalizes understanding 

## 2014-01-04 NOTE — Progress Notes (Signed)
   Subjective:    Patient ID: Gabriel Poole, male    DOB: 1946/03/10, 68 y.o.   MRN: [id number]  HPI  This 68 y.o. male presents for evaluation of routine follow up on HTN, gout, and hyperlipidemia.  He has hx of elevated PSA and doesn't remember if he has seen urology.  Review of Systems    No chest pain, SOB, HA, dizziness, vision change, N/V, diarrhea, constipation, dysuria, urinary urgency or frequency, myalgias, arthralgias or rash.  Objective:   Physical Exam  Vital signs noted  Well developed well nourished male.  HEENT - Head atraumatic Normocephalic                Eyes - PERRLA, Conjuctiva - clear Sclera- Clear EOMI                Ears - EAC's Wnl TM's Wnl Gross Hearing WNL                Throat - oropharanx wnl Respiratory - Lungs CTA bilateral Cardiac - RRR S1 and S2 without murmur GI - Abdomen soft Nontender and bowel sounds active x 4       Assessment & Plan:  Other male erectile dysfunction - Plan: tadalafil (CIALIS) 20 MG tablet  Acute idiopathic gout of right foot - Plan: POCT CBC, CMP14+EGFR, Uric acid, indomethacin (INDOCIN) 50 MG capsule, HYDROcodone-acetaminophen (NORCO/VICODIN) 5-325 MG per tablet  Acute back pain - Plan: HYDROcodone-acetaminophen (NORCO/VICODIN) 5-325 MG per tablet  Essential hypertension, benign - Plan: metoprolol (LOPRESSOR) 50 MG tablet  Other and unspecified hyperlipidemia - Plan: atorvastatin (LIPITOR) 40 MG tablet  Elevated PSA - Plan: Ambulatory referral to Urology  Lysbeth Penner FNP

## 2014-01-05 ENCOUNTER — Telehealth: Payer: Self-pay | Admitting: Family Medicine

## 2014-01-05 ENCOUNTER — Other Ambulatory Visit: Payer: Self-pay | Admitting: Family Medicine

## 2014-01-05 MED ORDER — FLUTICASONE PROPIONATE 50 MCG/ACT NA SUSP: 2.0000 | Freq: Every day | NASAL | Status: DC

## 2014-01-05 NOTE — Telephone Encounter (Signed)
Patient needs to see urology due to elevated PSA. We will not be able to write a prescription.

## 2014-01-05 NOTE — Telephone Encounter (Signed)
rx of flonase sent to pharmacy

## 2014-01-05 NOTE — Telephone Encounter (Signed)
Pt informed he needs to see urologist Verbalizes understanding

## 2014-01-12 ENCOUNTER — Other Ambulatory Visit: Payer: Self-pay | Admitting: Gastroenterology

## 2014-01-17 ENCOUNTER — Telehealth: Payer: Self-pay | Admitting: Family Medicine

## 2014-01-17 ENCOUNTER — Other Ambulatory Visit: Payer: Self-pay | Admitting: Family Medicine

## 2014-01-17 DIAGNOSIS — R972 Elevated prostate specific antigen [PSA]: Secondary | ICD-10-CM

## 2014-01-17 NOTE — Telephone Encounter (Signed)
Gabriel Poole, this patient needs another urologist because the urologist we sent the patient to cannot do the biopsy due to  Health issues.  Please refer to another urologist.  I have sent a urology referral order in already and the last urologist info is on the note from [facility]

## 2014-01-17 NOTE — Telephone Encounter (Signed)
Gabriel Poole please refer to urology

## 2014-01-18 ENCOUNTER — Other Ambulatory Visit: Payer: Self-pay

## 2014-01-18 DIAGNOSIS — N400 Enlarged prostate without lower urinary tract symptoms: Secondary | ICD-10-CM

## 2014-02-05 ENCOUNTER — Telehealth: Payer: Self-pay | Admitting: Family Medicine

## 2014-02-05 ENCOUNTER — Other Ambulatory Visit: Payer: Self-pay | Admitting: Family Medicine

## 2014-02-05 DIAGNOSIS — M549 Dorsalgia, unspecified: Secondary | ICD-10-CM

## 2014-02-05 DIAGNOSIS — M10071 Idiopathic gout, right ankle and foot: Secondary | ICD-10-CM

## 2014-02-05 MED ORDER — HYDROCODONE-ACETAMINOPHEN 5-325 MG PO TABS
1.0000 | ORAL_TABLET | Freq: Four times a day (QID) | ORAL | Status: DC | PRN
Start: 2014-02-05 — End: 2014-02-22

## 2014-02-05 NOTE — Telephone Encounter (Signed)
Come p/u pain med for gout

## 2014-02-05 NOTE — Telephone Encounter (Signed)
Patient aware to pick up when ready

## 2014-02-22 ENCOUNTER — Other Ambulatory Visit: Payer: Self-pay | Admitting: Family Medicine

## 2014-02-22 ENCOUNTER — Telehealth: Payer: Self-pay | Admitting: Family Medicine

## 2014-02-22 ENCOUNTER — Other Ambulatory Visit: Payer: Self-pay | Admitting: *Deleted

## 2014-02-22 DIAGNOSIS — M10071 Idiopathic gout, right ankle and foot: Secondary | ICD-10-CM

## 2014-02-22 DIAGNOSIS — M549 Dorsalgia, unspecified: Secondary | ICD-10-CM

## 2014-02-22 MED ORDER — HYDROCODONE-ACETAMINOPHEN 5-325 MG PO TABS: 1.0000 | ORAL_TABLET | Freq: Four times a day (QID) | ORAL | Status: DC | PRN

## 2014-02-22 NOTE — Telephone Encounter (Signed)
Rx refill sent back to [facility] for review

## 2014-02-22 NOTE — Telephone Encounter (Signed)
Patient requesting refill on hydrocodone. Was seen in office on 01-02-14. Was given #12 on 02-05-14. Please advise. Patient is requesting 30 pills. If approved please route to Pool A so nurse can call patient to pick up. Rx will print

## 2014-03-05 NOTE — Telephone Encounter (Signed)
RX done. 

## 2014-03-09 ENCOUNTER — Other Ambulatory Visit: Payer: Self-pay | Admitting: Urology

## 2014-03-09 ENCOUNTER — Ambulatory Visit (INDEPENDENT_AMBULATORY_CARE_PROVIDER_SITE_OTHER): Payer: Medicare HMO | Admitting: Urology

## 2014-03-09 DIAGNOSIS — R972 Elevated prostate specific antigen [PSA]: Secondary | ICD-10-CM

## 2014-03-09 DIAGNOSIS — N403 Nodular prostate with lower urinary tract symptoms: Secondary | ICD-10-CM

## 2014-03-09 DIAGNOSIS — N390 Urinary tract infection, site not specified: Secondary | ICD-10-CM

## 2014-03-27 ENCOUNTER — Other Ambulatory Visit: Payer: Self-pay | Admitting: Urology

## 2014-03-27 DIAGNOSIS — R972 Elevated prostate specific antigen [PSA]: Secondary | ICD-10-CM

## 2014-03-29 ENCOUNTER — Telehealth: Payer: Self-pay | Admitting: Family Medicine

## 2014-03-29 DIAGNOSIS — M549 Dorsalgia, unspecified: Secondary | ICD-10-CM

## 2014-03-29 DIAGNOSIS — M10071 Idiopathic gout, right ankle and foot: Secondary | ICD-10-CM

## 2014-03-29 NOTE — Telephone Encounter (Signed)
Patient is requesting a refill on his hydrocodone he has appointment with 12/8.

## 2014-03-30 ENCOUNTER — Other Ambulatory Visit: Payer: Self-pay | Admitting: Family Medicine

## 2014-03-30 NOTE — Telephone Encounter (Signed)
This message is also in another encounter and we are waiting on the provider.

## 2014-03-30 NOTE — Telephone Encounter (Signed)
Please review and advise.

## 2014-03-31 ENCOUNTER — Telehealth: Payer: Self-pay | Admitting: Family Medicine

## 2014-04-02 NOTE — Telephone Encounter (Signed)
Pt is calling wanting a refill on hydrocodone, last ordered 02/22/14 by Dietrich Pates #[phone] no refills, please advise.

## 2014-04-03 MED ORDER — HYDROCODONE-ACETAMINOPHEN 5-325 MG PO TABS: 1.0000 | ORAL_TABLET | Freq: Four times a day (QID) | ORAL | Status: DC | PRN

## 2014-04-03 NOTE — Telephone Encounter (Signed)
Please review and advise.

## 2014-04-03 NOTE — Telephone Encounter (Signed)
Rx okayed and signed per Dr Laurance Flatten

## 2014-04-17 ENCOUNTER — Ambulatory Visit (INDEPENDENT_AMBULATORY_CARE_PROVIDER_SITE_OTHER): Payer: Commercial Managed Care - HMO | Admitting: Family Medicine

## 2014-04-17 ENCOUNTER — Encounter: Payer: Self-pay | Admitting: Family Medicine

## 2014-04-17 VITALS — BP 145/80 | HR 65 | Temp 97.5°F | Ht 70.0 in | Wt 192.6 lb

## 2014-04-17 DIAGNOSIS — M549 Dorsalgia, unspecified: Secondary | ICD-10-CM

## 2014-04-17 DIAGNOSIS — M10071 Idiopathic gout, right ankle and foot: Secondary | ICD-10-CM

## 2014-04-17 MED ORDER — INDOMETHACIN 50 MG PO CAPS: 50.0000 mg | ORAL_CAPSULE | Freq: Three times a day (TID) | ORAL | Status: DC

## 2014-04-17 MED ORDER — HYDROCODONE-ACETAMINOPHEN 5-325 MG PO TABS: 1.0000 | ORAL_TABLET | Freq: Four times a day (QID) | ORAL | Status: DC | PRN

## 2014-04-17 MED ORDER — ALLOPURINOL 100 MG PO TABS: 100.0000 mg | ORAL_TABLET | Freq: Every day | ORAL | Status: DC

## 2014-04-17 NOTE — Progress Notes (Signed)
   Subjective:    Patient ID: Gabriel Poole, male    DOB: 02-Jul-1945, 67 y.o.   MRN: [id number]  HPI Patient is here with c/o gout in right foot.  Review of Systems  Constitutional: Negative for fever.  HENT: Negative for ear pain.   Eyes: Negative for discharge.  Respiratory: Negative for cough.   Cardiovascular: Negative for chest pain.  Gastrointestinal: Negative for abdominal distention.  Endocrine: Negative for polyuria.  Genitourinary: Negative for difficulty urinating.  Musculoskeletal: Negative for gait problem and neck pain.  Skin: Negative for color change and rash.  Neurological: Negative for speech difficulty and headaches.  Psychiatric/Behavioral: Negative for agitation.       Objective:    BP 145/80 mmHg  Pulse 65  Temp(Src) 97.5 F (36.4 C) (Oral)  Ht 5\' 10"  (1.778 m)  Wt 192 lb 9.6 oz (87.363 kg)  BMI 27.64 kg/m2 Physical Exam  Constitutional: He is oriented to person, place, and time. He appears well-developed and well-nourished.  HENT:  Head: Normocephalic and atraumatic.  Mouth/Throat: Oropharynx is clear and moist.  Eyes: Pupils are equal, round, and reactive to light.  Neck: Normal range of motion. Neck supple.  Cardiovascular: Normal rate and regular rhythm.   No murmur heard. Pulmonary/Chest: Effort normal and breath sounds normal.  Abdominal: Soft. Bowel sounds are normal. There is no tenderness.  Neurological: He is alert and oriented to person, place, and time.  Skin: Skin is warm and dry.  Psychiatric: He has a normal mood and affect.          Assessment & Plan:     ICD-9-CM ICD-10-CM   1. Acute idiopathic gout of right foot 274.01 M10.071 HYDROcodone-acetaminophen (NORCO/VICODIN) 5-325 MG per tablet     indomethacin (INDOCIN) 50 MG capsule     allopurinol (ZYLOPRIM) 100 MG tablet     DISCONTINUED: indomethacin (INDOCIN) 50 MG capsule  2. Acute back pain 724.5 M54.9      No Follow-up on file.  Lysbeth Penner FNP

## 2014-04-27 ENCOUNTER — Ambulatory Visit (HOSPITAL_COMMUNITY): Admission: RE | Admit: 2014-04-27 | Payer: Medicare HMO | Source: Ambulatory Visit

## 2014-05-22 ENCOUNTER — Telehealth: Payer: Self-pay | Admitting: Family Medicine

## 2014-05-22 ENCOUNTER — Other Ambulatory Visit: Payer: Self-pay | Admitting: Family Medicine

## 2014-05-22 DIAGNOSIS — M10071 Idiopathic gout, right ankle and foot: Secondary | ICD-10-CM

## 2014-05-22 NOTE — Telephone Encounter (Signed)
Last seen and filled 04/17/14, Rx will print

## 2014-05-23 ENCOUNTER — Other Ambulatory Visit: Payer: Self-pay | Admitting: Family Medicine

## 2014-05-23 MED ORDER — HYDROCODONE-ACETAMINOPHEN 5-325 MG PO TABS: 1.0000 | ORAL_TABLET | Freq: Four times a day (QID) | ORAL | Status: DC | PRN

## 2014-06-15 ENCOUNTER — Ambulatory Visit (HOSPITAL_COMMUNITY): Payer: Medicare HMO

## 2014-06-27 ENCOUNTER — Telehealth: Payer: Self-pay | Admitting: Family Medicine

## 2014-06-27 ENCOUNTER — Other Ambulatory Visit: Payer: Self-pay | Admitting: Family Medicine

## 2014-06-27 DIAGNOSIS — M10071 Idiopathic gout, right ankle and foot: Secondary | ICD-10-CM

## 2014-06-27 DIAGNOSIS — M5417 Radiculopathy, lumbosacral region: Secondary | ICD-10-CM

## 2014-06-27 MED ORDER — ATORVASTATIN CALCIUM 40 MG PO TABS: 40.0000 mg | ORAL_TABLET | Freq: Every day | ORAL | Status: DC

## 2014-06-27 MED ORDER — HYDROCODONE-ACETAMINOPHEN 5-325 MG PO TABS: 1.0000 | ORAL_TABLET | Freq: Four times a day (QID) | ORAL | Status: DC | PRN

## 2014-06-27 NOTE — Telephone Encounter (Signed)
[facility] [employer]. Last seen in office on 04-17-14. Rx last filled on 05-23-14 for #30. Please advise. If approved please print and route to Pool A so nurse can call patient to pick up

## 2014-06-27 NOTE — Telephone Encounter (Signed)
The hydrocodone is okay 1 and will need to be seen for future refills

## 2014-06-27 NOTE — Telephone Encounter (Signed)
See if Dr. Laurance Flatten will do, no one else has seen him except [facility]. Last filled 05/23/14, last seen 04/17/14

## 2014-06-27 NOTE — Telephone Encounter (Signed)
Script ready to be picked up. Patient aware.

## 2014-07-13 ENCOUNTER — Ambulatory Visit (HOSPITAL_COMMUNITY): Payer: Medicare HMO

## 2014-07-27 ENCOUNTER — Ambulatory Visit: Payer: Medicare HMO | Admitting: Urology

## 2014-07-27 ENCOUNTER — Other Ambulatory Visit: Payer: Self-pay | Admitting: Family Medicine

## 2014-07-27 DIAGNOSIS — M10071 Idiopathic gout, right ankle and foot: Secondary | ICD-10-CM

## 2014-07-29 NOTE — Telephone Encounter (Signed)
Last seen 04/17/14, last filled 06/27/14.[facility] pt.

## 2014-07-30 MED ORDER — HYDROCODONE-ACETAMINOPHEN 5-325 MG PO TABS: 1.0000 | ORAL_TABLET | Freq: Four times a day (QID) | ORAL | Status: DC | PRN

## 2014-07-30 NOTE — Telephone Encounter (Signed)
Lm with wife that pot will need appt with [facility] before this mo runs out

## 2014-08-10 ENCOUNTER — Ambulatory Visit (INDEPENDENT_AMBULATORY_CARE_PROVIDER_SITE_OTHER): Payer: Commercial Managed Care - HMO | Admitting: Family Medicine

## 2014-08-10 ENCOUNTER — Encounter: Payer: Self-pay | Admitting: Family Medicine

## 2014-08-10 VITALS — BP 139/83 | HR 68 | Temp 97.0°F | Ht 70.0 in | Wt 190.6 lb

## 2014-08-10 DIAGNOSIS — R5383 Other fatigue: Secondary | ICD-10-CM | POA: Diagnosis not present

## 2014-08-10 DIAGNOSIS — E785 Hyperlipidemia, unspecified: Secondary | ICD-10-CM | POA: Diagnosis not present

## 2014-08-10 DIAGNOSIS — I10 Essential (primary) hypertension: Secondary | ICD-10-CM | POA: Diagnosis not present

## 2014-08-10 DIAGNOSIS — M1000 Idiopathic gout, unspecified site: Secondary | ICD-10-CM

## 2014-08-10 DIAGNOSIS — N400 Enlarged prostate without lower urinary tract symptoms: Secondary | ICD-10-CM

## 2014-08-10 DIAGNOSIS — R609 Edema, unspecified: Secondary | ICD-10-CM | POA: Diagnosis not present

## 2014-08-10 MED ORDER — TRIAMTERENE-HCTZ 37.5-25 MG PO TABS: 1.0000 | ORAL_TABLET | Freq: Every day | ORAL | Status: DC

## 2014-08-10 NOTE — Progress Notes (Signed)
Subjective:  Patient ID: Gabriel Poole, male    DOB: Dec 30, 1945  Age: 69 y.o. MRN: [id number]  CC: Hypertension; Hyperlipidemia; Gout; and Benign Prostatic Hypertrophy   HPI Gabriel Poole presents for follow-up of hypertension. Patient has no history of headache chest pain or shortness of breath or recent cough. Patient also denies symptoms of TIA such as numbness weakness lateralizing. Patient checks  blood pressure at home and has not had any elevated readings recently. Patient denies side effects from his medication. States taking it regularly. Patient in for follow-up of elevated cholesterol. Doing well without complaints on current medication. Denies side effects of statin including myalgia and arthralgia and nausea. Also in today for liver function testing. Currently no chest pain, shortness of breath or other cardiovascular related symptoms noted.  Patient also takes allopurinol to prevent gout. No recent symptoms noted. Medication agrees with him.  BPH causes him to get up to go to the bathroom at night. Also having some hesitancy and frequency during the day.  History Gabriel Poole has a past medical history of Hypertension; Arthritis; Heart murmur; Gout; and Hyperlipidemia.   He has past surgical history that includes Finger surgery and Hydrocele surgery (02/12/2011).   His family history includes Hypertension in his mother. There is no history of Hypotension, Anesthesia problems, Malignant hyperthermia, or Pseudochol deficiency.He reports that he quit smoking about 15 months ago. His smoking use included Cigarettes. He has a 22.5 pack-year smoking history. He has never used smokeless tobacco. He reports that he does not drink alcohol or use illicit drugs.  Current Outpatient Prescriptions on File Prior to Visit  Medication Sig Dispense Refill  . allopurinol (ZYLOPRIM) 100 MG tablet Take 1 tablet (100 mg total) by mouth daily. 30 tablet 6  . amLODipine (NORVASC) 10 MG tablet  Take 1 tablet (10 mg total) by mouth daily. 30 tablet 11  . aspirin 81 MG tablet Take 81 mg by mouth daily.    Marland Kitchen atorvastatin (LIPITOR) 40 MG tablet Take 1 tablet (40 mg total) by mouth daily at 6 PM. 30 tablet 5  . HYDROcodone-acetaminophen (NORCO/VICODIN) 5-325 MG per tablet Take 1 tablet by mouth every 6 (six) hours as needed. 30 tablet 0  . metoprolol (LOPRESSOR) 50 MG tablet Take 1 tablet (50 mg total) by mouth 2 (two) times daily. 60 tablet 5   No current facility-administered medications on file prior to visit.    ROS Review of Systems  Constitutional: Negative for fever, chills, diaphoresis and unexpected weight change.  HENT: Negative for congestion, hearing loss, rhinorrhea, sore throat and trouble swallowing.   Respiratory: Negative for cough, chest tightness, shortness of breath and wheezing.   Gastrointestinal: Negative for nausea, vomiting, abdominal pain, diarrhea, constipation and abdominal distention.  Endocrine: Negative for cold intolerance and heat intolerance.  Genitourinary: Negative for dysuria, hematuria and flank pain.  Musculoskeletal: Negative for joint swelling and arthralgias.  Skin: Negative for rash.  Neurological: Negative for dizziness and headaches.  Psychiatric/Behavioral: Negative for dysphoric mood, decreased concentration and agitation. The patient is not nervous/anxious.     Objective:  BP 139/83 mmHg  Pulse 68  Temp(Src) 97 F (36.1 C) (Oral)  Ht '5\' 10"'  (1.778 m)  Wt 190 lb 9.6 oz (86.456 kg)  BMI 27.35 kg/m2  BP Readings from Last 3 Encounters:  08/10/14 139/83  04/17/14 145/80  01/02/14 166/82    Wt Readings from Last 3 Encounters:  08/10/14 190 lb 9.6 oz (86.456 kg)  04/17/14 192 lb 9.6 oz (  87.363 kg)  01/02/14 191 lb 6.4 oz (86.818 kg)     Physical Exam  Constitutional: He is oriented to person, place, and time. He appears well-developed and well-nourished. No distress.  HENT:  Head: Normocephalic and atraumatic.  Eyes:  Conjunctivae and EOM are normal. Pupils are equal, round, and reactive to light.  Neck: Normal range of motion. Neck supple. No thyromegaly present.  Cardiovascular: Normal rate, regular rhythm and normal heart sounds.   No murmur heard. Pulmonary/Chest: Effort normal and breath sounds normal. No respiratory distress. He has no wheezes. He has no rales.  Abdominal: Soft. Bowel sounds are normal.  Lymphadenopathy:    He has no cervical adenopathy.  Neurological: He is alert and oriented to person, place, and time. He has normal reflexes.  Skin: Skin is warm and dry.  Psychiatric: He has a normal mood and affect. His behavior is normal. Judgment and thought content normal.    Lab Results  Component Value Date   HGBA1C 4.9% 07/24/2013    Lab Results  Component Value Date   WBC 8.5 01/02/2014   HGB 14.8 01/02/2014   HCT 45.3 01/02/2014   PLT 245 02/05/2011   GLUCOSE 80 01/02/2014   CHOL 123 07/24/2013   TRIG 68 07/24/2013   HDL 45 07/24/2013   LDLCALC 64 07/24/2013   ALT 14 01/02/2014   AST 18 01/02/2014   NA 144 01/02/2014   K 3.4* 01/02/2014   CL 101 01/02/2014   CREATININE 1.13 01/02/2014   BUN 15 01/02/2014   CO2 25 01/02/2014   TSH 2.372 12/17/2010   PSA 6.5* 07/24/2013   HGBA1C 4.9% 07/24/2013    No results found.  Assessment & Plan:   Gabriel Poole was seen today for hypertension, hyperlipidemia, gout and benign prostatic hypertrophy.  Diagnoses and all orders for this visit:  Essential hypertension Orders: -     CBC with Differential/Platelet -     CMP14+EGFR  HLD (hyperlipidemia) Orders: -     CMP14+EGFR -     NMR, lipoprofile  BPH (benign prostatic hypertrophy) Orders: -     PSA, total and free  Other fatigue Orders: -     Thyroid Panel With TSH -     Vit D  25 hydroxy (rtn osteoporosis monitoring)  Edema  Idiopathic gout, unspecified chronicity, unspecified site Orders: -     Uric acid  Other orders -     triamterene-hydrochlorothiazide  (MAXZIDE-25) 37.5-25 MG per tablet; Take 1 tablet by mouth daily.   I have discontinued Mr. Feldner indomethacin. I am also having him start on triamterene-hydrochlorothiazide. Additionally, I am having him maintain his aspirin, metoprolol, amLODipine, allopurinol, atorvastatin, and HYDROcodone-acetaminophen.  Meds ordered this encounter  Medications  . triamterene-hydrochlorothiazide (MAXZIDE-25) 37.5-25 MG per tablet    Sig: Take 1 tablet by mouth daily.    Dispense:  90 tablet    Refill:  3     Follow-up: No Follow-up on file.  Claretta Fraise, M.D.

## 2014-08-11 ENCOUNTER — Other Ambulatory Visit: Payer: Commercial Managed Care - HMO

## 2014-08-11 DIAGNOSIS — R5383 Other fatigue: Secondary | ICD-10-CM | POA: Diagnosis not present

## 2014-08-11 DIAGNOSIS — N400 Enlarged prostate without lower urinary tract symptoms: Secondary | ICD-10-CM | POA: Diagnosis not present

## 2014-08-11 DIAGNOSIS — R531 Weakness: Secondary | ICD-10-CM

## 2014-08-11 DIAGNOSIS — E785 Hyperlipidemia, unspecified: Secondary | ICD-10-CM | POA: Diagnosis not present

## 2014-08-11 DIAGNOSIS — I10 Essential (primary) hypertension: Secondary | ICD-10-CM

## 2014-08-13 LAB — CBC WITH DIFFERENTIAL/PLATELET
Basophils Absolute: 0 10*3/uL (ref 0.0–0.2)
Basos: 1 %
EOS ABS: 0.1 10*3/uL (ref 0.0–0.4)
Eos: 1 %
HEMATOCRIT: 40.5 % (ref 37.5–51.0)
HEMOGLOBIN: 13.1 g/dL (ref 12.6–17.7)
IMMATURE GRANS (ABS): 0 10*3/uL (ref 0.0–0.1)
IMMATURE GRANULOCYTES: 0 %
LYMPHS ABS: 2.1 10*3/uL (ref 0.7–3.1)
Lymphs: 29 %
MCH: 28.9 pg (ref 26.6–33.0)
MCHC: 32.3 g/dL (ref 31.5–35.7)
MCV: 89 fL (ref 79–97)
MONOS ABS: 0.4 10*3/uL (ref 0.1–0.9)
Monocytes: 6 %
Neutrophils Absolute: 4.7 10*3/uL (ref 1.4–7.0)
Neutrophils Relative %: 63 %
Platelets: 294 10*3/uL (ref 150–379)
RBC: 4.54 x10E6/uL (ref 4.14–5.80)
RDW: 13.4 % (ref 12.3–15.4)
WBC: 7.4 10*3/uL (ref 3.4–10.8)

## 2014-08-13 LAB — NMR, LIPOPROFILE
Cholesterol: 99 mg/dL — ABNORMAL LOW (ref 100–199)
HDL Cholesterol by NMR: 37 mg/dL — ABNORMAL LOW (ref >39)
HDL Particle Number: 23.6 umol/L — ABNORMAL LOW (ref >=30.5)
LDL Particle Number: 750 nmol/L (ref <1000)
LDL Size: 20.4 nm (ref >20.5)
LDL-C: 51 mg/dL (ref 0–99)
LP-IR Score: 50 — ABNORMAL HIGH (ref <=45)
SMALL LDL PARTICLE NUMBER: 351 nmol/L (ref <=527)
Triglycerides by NMR: 53 mg/dL (ref 0–149)

## 2014-08-13 LAB — PSA, TOTAL AND FREE
PSA, Free Pct: 17.8 %
PSA, Free: 1.44 ng/mL
PSA: 8.1 ng/mL — ABNORMAL HIGH (ref 0.0–4.0)

## 2014-08-13 LAB — CMP14+EGFR
ALK PHOS: 110 IU/L (ref 39–117)
ALT: 22 IU/L (ref 0–44)
AST: 19 IU/L (ref 0–40)
Albumin/Globulin Ratio: 2 (ref 1.1–2.5)
Albumin: 4.3 g/dL (ref 3.6–4.8)
BUN/Creatinine Ratio: 13 (ref 10–22)
BUN: 14 mg/dL (ref 8–27)
Bilirubin Total: 0.6 mg/dL (ref 0.0–1.2)
CALCIUM: 9.2 mg/dL (ref 8.6–10.2)
CO2: 21 mmol/L (ref 18–29)
Chloride: 103 mmol/L (ref 97–108)
Creatinine, Ser: 1.1 mg/dL (ref 0.76–1.27)
GFR calc non Af Amer: 69 mL/min/{1.73_m2} (ref >59)
GFR, EST AFRICAN AMERICAN: 79 mL/min/{1.73_m2} (ref >59)
GLOBULIN, TOTAL: 2.2 g/dL (ref 1.5–4.5)
Glucose: 87 mg/dL (ref 65–99)
POTASSIUM: 3.7 mmol/L (ref 3.5–5.2)
Sodium: 144 mmol/L (ref 134–144)
Total Protein: 6.5 g/dL (ref 6.0–8.5)

## 2014-08-13 LAB — THYROID PANEL WITH TSH
Free Thyroxine Index: 1.2 (ref 1.2–4.9)
T3 Uptake Ratio: 30 % (ref 24–39)
T4, Total: 4 ug/dL — ABNORMAL LOW (ref 4.5–12.0)
TSH: 3.31 u[IU]/mL (ref 0.450–4.500)

## 2014-08-13 LAB — VITAMIN D 25 HYDROXY (VIT D DEFICIENCY, FRACTURES): Vit D, 25-Hydroxy: 20.6 ng/mL — ABNORMAL LOW (ref 30.0–100.0)

## 2014-08-13 LAB — URIC ACID: Uric Acid: 6.9 mg/dL (ref 3.7–8.6)

## 2014-08-13 LAB — SPECIMEN STATUS REPORT

## 2014-08-16 ENCOUNTER — Telehealth: Payer: Self-pay | Admitting: *Deleted

## 2014-08-16 DIAGNOSIS — R972 Elevated prostate specific antigen [PSA]: Secondary | ICD-10-CM

## 2014-08-16 MED ORDER — VITAMIN D (ERGOCALCIFEROL) 1.25 MG (50000 UNIT) PO CAPS: 50000.0000 [IU] | ORAL_CAPSULE | ORAL | Status: DC

## 2014-08-16 NOTE — Telephone Encounter (Signed)
-----   Message from Claretta Fraise, MD sent at 08/13/2014  5:48 PM EDT ----- Vitamin D quite low. Needs supplement.Use 50,000 units twice weekly for two months. Then switch to OTC Vitamin D 2000 units daily. REcehck vitamin D level with office visit in 6 months. Your PSA has climbed slightly from last year. Nurse, please forward this information to his urologist. He needs to follow-up with urology

## 2014-08-16 NOTE — Telephone Encounter (Signed)
Pt's wife notified of results Rx sent into [facility] for Vit D Urology referral entered in [facility]

## 2014-08-23 ENCOUNTER — Telehealth: Payer: Self-pay | Admitting: Family Medicine

## 2014-08-27 ENCOUNTER — Other Ambulatory Visit: Payer: Self-pay | Admitting: Family Medicine

## 2014-08-27 DIAGNOSIS — M10071 Idiopathic gout, right ankle and foot: Secondary | ICD-10-CM

## 2014-08-27 NOTE — Telephone Encounter (Signed)
He would have to be seen for that

## 2014-08-27 NOTE — Telephone Encounter (Signed)
Patient aware and appointment scheduled.  

## 2014-08-27 NOTE — Telephone Encounter (Signed)
Patient is requesting a refill on hydrocodone.

## 2014-08-28 ENCOUNTER — Encounter: Payer: Self-pay | Admitting: Family Medicine

## 2014-08-28 ENCOUNTER — Ambulatory Visit (INDEPENDENT_AMBULATORY_CARE_PROVIDER_SITE_OTHER): Payer: Commercial Managed Care - HMO | Admitting: Family Medicine

## 2014-08-28 VITALS — BP 124/80 | HR 71 | Temp 98.2°F | Ht 70.0 in | Wt 193.0 lb

## 2014-08-28 DIAGNOSIS — M5417 Radiculopathy, lumbosacral region: Secondary | ICD-10-CM

## 2014-08-28 DIAGNOSIS — I10 Essential (primary) hypertension: Secondary | ICD-10-CM | POA: Diagnosis not present

## 2014-08-28 DIAGNOSIS — M5416 Radiculopathy, lumbar region: Secondary | ICD-10-CM

## 2014-08-28 DIAGNOSIS — M10071 Idiopathic gout, right ankle and foot: Secondary | ICD-10-CM | POA: Diagnosis not present

## 2014-08-28 MED ORDER — HYDROCODONE-ACETAMINOPHEN 5-325 MG PO TABS: 1.0000 | ORAL_TABLET | Freq: Four times a day (QID) | ORAL | Status: DC | PRN

## 2014-08-28 MED ORDER — TAMSULOSIN HCL 0.4 MG PO CAPS
0.8000 mg | ORAL_CAPSULE | Freq: Every day | ORAL | Status: DC
Start: 2014-08-28 — End: 2014-12-06

## 2014-08-28 MED ORDER — VITAMIN D (ERGOCALCIFEROL) 1.25 MG (50000 UNIT) PO CAPS: 50000.0000 [IU] | ORAL_CAPSULE | ORAL | Status: DC

## 2014-08-28 MED ORDER — METOPROLOL TARTRATE 50 MG PO TABS: 50.0000 mg | ORAL_TABLET | Freq: Two times a day (BID) | ORAL | Status: DC

## 2014-08-28 MED ORDER — AMLODIPINE BESYLATE 10 MG PO TABS: 10.0000 mg | ORAL_TABLET | Freq: Every day | ORAL | Status: DC

## 2014-08-28 MED ORDER — ATORVASTATIN CALCIUM 40 MG PO TABS: 40.0000 mg | ORAL_TABLET | Freq: Every day | ORAL | Status: DC

## 2014-08-28 NOTE — Patient Instructions (Signed)
Discontinue smoking!

## 2014-08-28 NOTE — Progress Notes (Signed)
Subjective:    Patient ID: Gabriel Poole, male    DOB: 25-Dec-1945, 69 y.o.   MRN: [id number]  HPI  Chief Complaint  Patient presents with  . Medication Refill    requesting 90 day supply of maintenance medications. Also needs refill on hydrocodone.    patient has chronic lumbar pain for which she takes hydrocodone. He generally takes on average one a day but some days requires more some days doesn't need any. The pain does radiate into the lower extremities. Primarily the right leg.   follow-up of hypertension. Patient has no history of headache chest pain or shortness of breath or recent cough. Patient also denies symptoms of TIA such as numbness weakness lateralizing. Patient checks  blood pressure at home and has not had any elevated readings recently. Patient denies side effects from his medication. States taking it regularly.   Patient in for follow-up of elevated cholesterol. Doing well without complaints on current medication. Denies side effects of statin including myalgia and arthralgia and nausea. Also in today for liver function testing. Currently no chest pain, shortness of breath or other cardiovascular related symptoms noted.  Patient Active Problem List   Diagnosis Date Noted  . Lumbar radiculopathy 10/20/2012  . Right leg weakness 10/20/2012  . Noncompliance 09/21/2012  . HTN (hypertension) 09/21/2012  . HLD (hyperlipidemia) 09/21/2012  . Gout 09/21/2012  . Hydrocele 12/17/2010  . Tobacco abuse 12/17/2010   Outpatient Encounter Prescriptions as of 08/28/2014  Medication Sig  . allopurinol (ZYLOPRIM) 100 MG tablet Take 1 tablet (100 mg total) by mouth daily.  Marland Kitchen amLODipine (NORVASC) 10 MG tablet Take 1 tablet (10 mg total) by mouth daily.  Marland Kitchen aspirin 81 MG tablet Take 81 mg by mouth daily.  Marland Kitchen atorvastatin (LIPITOR) 40 MG tablet Take 1 tablet (40 mg total) by mouth daily at 6 PM.  . HYDROcodone-acetaminophen (NORCO/VICODIN) 5-325 MG per tablet Take 1 tablet by mouth  every 6 (six) hours as needed.  . metoprolol (LOPRESSOR) 50 MG tablet Take 1 tablet (50 mg total) by mouth 2 (two) times daily.  Marland Kitchen triamterene-hydrochlorothiazide (MAXZIDE-25) 37.5-25 MG per tablet Take 1 tablet by mouth daily.  . [DISCONTINUED] amLODipine (NORVASC) 10 MG tablet Take 1 tablet (10 mg total) by mouth daily.  . [DISCONTINUED] atorvastatin (LIPITOR) 40 MG tablet Take 1 tablet (40 mg total) by mouth daily at 6 PM.  . [DISCONTINUED] HYDROcodone-acetaminophen (NORCO/VICODIN) 5-325 MG per tablet Take 1 tablet by mouth every 6 (six) hours as needed.  . [DISCONTINUED] HYDROcodone-acetaminophen (NORCO/VICODIN) 5-325 MG per tablet Take 1 tablet by mouth every 6 (six) hours as needed.  . [DISCONTINUED] metoprolol (LOPRESSOR) 50 MG tablet Take 1 tablet (50 mg total) by mouth 2 (two) times daily.  . [DISCONTINUED] Vitamin D, Ergocalciferol, (DRISDOL) 50000 UNITS CAPS capsule Take 1 capsule (50,000 Units total) by mouth every 7 (seven) days.  . tamsulosin (FLOMAX) 0.4 MG CAPS capsule Take 2 capsules (0.8 mg total) by mouth daily after supper. For urine flow and prostate  . Vitamin D, Ergocalciferol, (DRISDOL) 50000 UNITS CAPS capsule Take 1 capsule (50,000 Units total) by mouth 2 (two) times a week.       Review of Systems  Constitutional: Negative for fever, chills, diaphoresis and unexpected weight change.  HENT: Negative for congestion, hearing loss, rhinorrhea, sore throat and trouble swallowing.   Respiratory: Negative for cough, chest tightness, shortness of breath and wheezing.   Gastrointestinal: Negative for nausea, vomiting, abdominal pain, diarrhea, constipation and abdominal distention.  Endocrine: Negative for cold intolerance and heat intolerance.  Genitourinary: Negative for dysuria, hematuria and flank pain.  Musculoskeletal: Negative for joint swelling and arthralgias.  Skin: Negative for rash.  Neurological: Negative for dizziness and headaches.  Psychiatric/Behavioral:  Negative for dysphoric mood, decreased concentration and agitation. The patient is not nervous/anxious.        Objective:   Physical Exam  Constitutional: He is oriented to person, place, and time. He appears well-developed and well-nourished. No distress.  HENT:  Head: Normocephalic and atraumatic.  Right Ear: External ear normal.  Left Ear: External ear normal.  Nose: Nose normal.  Mouth/Throat: Oropharynx is clear and moist.  Eyes: Conjunctivae and EOM are normal. Pupils are equal, round, and reactive to light.  Neck: Normal range of motion. Neck supple. No thyromegaly present.  Cardiovascular: Normal rate, regular rhythm and normal heart sounds.   No murmur heard. Pulmonary/Chest: Effort normal and breath sounds normal. No respiratory distress. He has no wheezes. He has no rales.  Abdominal: Soft. Bowel sounds are normal. He exhibits no distension. There is no tenderness.  Lymphadenopathy:    He has no cervical adenopathy.  Neurological: He is alert and oriented to person, place, and time. He has normal reflexes.  Skin: Skin is warm and dry.  Psychiatric: He has a normal mood and affect. His behavior is normal. Judgment and thought content normal.    BP 124/80 mmHg  Pulse 71  Temp(Src) 98.2 F (36.8 C) (Oral)  Ht 5\' 10"  (1.778 m)  Wt 193 lb (87.544 kg)  BMI 27.69 kg/m2       Assessment & Plan:   1. Essential hypertension, benign   2. Lumbosacral radiculitis   3. Essential hypertension   4. Acute idiopathic gout of right foot     Meds ordered this encounter  Medications  . metoprolol (LOPRESSOR) 50 MG tablet    Sig: Take 1 tablet (50 mg total) by mouth 2 (two) times daily.    Dispense:  180 tablet    Refill:  1  . atorvastatin (LIPITOR) 40 MG tablet    Sig: Take 1 tablet (40 mg total) by mouth daily at 6 PM.    Dispense:  90 tablet    Refill:  1  . amLODipine (NORVASC) 10 MG tablet    Sig: Take 1 tablet (10 mg total) by mouth daily.    Dispense:  90 tablet      Refill:  1  . tamsulosin (FLOMAX) 0.4 MG CAPS capsule    Sig: Take 2 capsules (0.8 mg total) by mouth daily after supper. For urine flow and prostate    Dispense:  60 capsule    Refill:  3  . Vitamin D, Ergocalciferol, (DRISDOL) 50000 UNITS CAPS capsule    Sig: Take 1 capsule (50,000 Units total) by mouth 2 (two) times a week.    Dispense:  16 capsule    Refill:  0  . DISCONTD: HYDROcodone-acetaminophen (NORCO/VICODIN) 5-325 MG per tablet    Sig: Take 1 tablet by mouth every 6 (six) hours as needed.    Dispense:  30 tablet    Refill:  0  . HYDROcodone-acetaminophen (NORCO/VICODIN) 5-325 MG per tablet    Sig: Take 1 tablet by mouth every 6 (six) hours as needed.    Dispense:  30 tablet    Refill:  0    No orders of the defined types were placed in this encounter.    Labs pending Health Maintenance reviewed Diet and exercise encouraged  Continue all meds as discussed Follow up in 6 months   Claretta Fraise, MD

## 2014-09-25 ENCOUNTER — Other Ambulatory Visit: Payer: Self-pay | Admitting: Family Medicine

## 2014-09-25 DIAGNOSIS — M10071 Idiopathic gout, right ankle and foot: Secondary | ICD-10-CM

## 2014-09-25 MED ORDER — HYDROCODONE-ACETAMINOPHEN 5-325 MG PO TABS: 1.0000 | ORAL_TABLET | Freq: Four times a day (QID) | ORAL | Status: DC | PRN

## 2014-09-25 NOTE — Telephone Encounter (Signed)
Last filled 08/28/14, last seen 08/10/14 by Dr. Livia Snellen. He is off all week.

## 2014-10-25 ENCOUNTER — Other Ambulatory Visit: Payer: Self-pay | Admitting: Family Medicine

## 2014-10-25 NOTE — Telephone Encounter (Signed)
He will need to be seen. I only refill narcotics at the time of an office visit.

## 2014-10-25 NOTE — Telephone Encounter (Signed)
PT AWARE  

## 2014-10-29 ENCOUNTER — Encounter: Payer: Self-pay | Admitting: Family Medicine

## 2014-10-29 ENCOUNTER — Ambulatory Visit (INDEPENDENT_AMBULATORY_CARE_PROVIDER_SITE_OTHER): Payer: Commercial Managed Care - HMO | Admitting: Family Medicine

## 2014-10-29 VITALS — BP 103/66 | HR 59 | Temp 97.3°F | Ht 70.0 in | Wt 182.0 lb

## 2014-10-29 DIAGNOSIS — E785 Hyperlipidemia, unspecified: Secondary | ICD-10-CM

## 2014-10-29 DIAGNOSIS — R42 Dizziness and giddiness: Secondary | ICD-10-CM

## 2014-10-29 DIAGNOSIS — R972 Elevated prostate specific antigen [PSA]: Secondary | ICD-10-CM | POA: Diagnosis not present

## 2014-10-29 DIAGNOSIS — M10071 Idiopathic gout, right ankle and foot: Secondary | ICD-10-CM | POA: Diagnosis not present

## 2014-10-29 DIAGNOSIS — I10 Essential (primary) hypertension: Secondary | ICD-10-CM

## 2014-10-29 MED ORDER — HYDROCODONE-ACETAMINOPHEN 5-325 MG PO TABS: 1.0000 | ORAL_TABLET | Freq: Four times a day (QID) | ORAL | Status: DC | PRN

## 2014-10-29 MED ORDER — ALLOPURINOL 100 MG PO TABS: 100.0000 mg | ORAL_TABLET | Freq: Every day | ORAL | Status: DC

## 2014-10-29 NOTE — Progress Notes (Signed)
Subjective:  Patient ID: Gabriel Poole, male    DOB: 08/20/1945  Age: 69 y.o. MRN: [id number]  CC: Medication Refill and Dizziness treated   HPI Gabriel Poole presents for  Taking norco one or less a day. Legs ache like crazy. Dizzy last week. Hot & sweaty. Lasted several minutes. No apparent relation to activity noted. He did not experience palpitations or chest pain.No syncope. Chronic leg pain has been chronically treated with when necessary hydrocodone. He also experiences gout periodically. Today he is in need of renewal of his allopurinol for prevention. It seems to work well at prevention. His attacks have become less frequent. However his right foot is swollen and red today.   follow-up of hypertension. Patient has no history of headache chest pain or shortness of breath or recent cough. Patient also denies symptoms of TIA such as numbness weakness lateralizing. Patient checks  blood pressure at home and has not had any elevated readings recently. Patient denies side effects from his medication. States taking it regularly.   Patient in for follow-up of elevated cholesterol. Doing well without complaints on current medication. Denies side effects of statin including myalgia and arthralgia and nausea. Also in today for liver function testing. Currently no chest pain, shortness of breath or other cardiovascular related symptoms noted.  History Gabriel Poole has a past medical history of Hypertension; Arthritis; Heart murmur; Gout; and Hyperlipidemia.   He has past surgical history that includes Finger surgery and Hydrocele surgery (02/12/2011).   His family history includes Hypertension in his mother. There is no history of Hypotension, Anesthesia problems, Malignant hyperthermia, or Pseudochol deficiency.He reports that he quit smoking about 18 months ago. His smoking use included Cigarettes. He has a 22.5 pack-year smoking history. He has never used smokeless tobacco. He reports that he  does not drink alcohol or use illicit drugs.  Outpatient Prescriptions Prior to Visit  Medication Sig Dispense Refill  . amLODipine (NORVASC) 10 MG tablet Take 1 tablet (10 mg total) by mouth daily. 90 tablet 1  . aspirin 81 MG tablet Take 81 mg by mouth daily.    Marland Kitchen atorvastatin (LIPITOR) 40 MG tablet Take 1 tablet (40 mg total) by mouth daily at 6 PM. 90 tablet 1  . metoprolol (LOPRESSOR) 50 MG tablet Take 1 tablet (50 mg total) by mouth 2 (two) times daily. 180 tablet 1  . tamsulosin (FLOMAX) 0.4 MG CAPS capsule Take 2 capsules (0.8 mg total) by mouth daily after supper. For urine flow and prostate 60 capsule 3  . triamterene-hydrochlorothiazide (MAXZIDE-25) 37.5-25 MG per tablet Take 1 tablet by mouth daily. 90 tablet 3  . Vitamin D, Ergocalciferol, (DRISDOL) 50000 UNITS CAPS capsule Take 1 capsule (50,000 Units total) by mouth 2 (two) times a week. 16 capsule 0  . allopurinol (ZYLOPRIM) 100 MG tablet Take 1 tablet (100 mg total) by mouth daily. 30 tablet 6  . HYDROcodone-acetaminophen (NORCO/VICODIN) 5-325 MG per tablet Take 1 tablet by mouth every 6 (six) hours as needed. 30 tablet 0   No facility-administered medications prior to visit.    ROS Review of Systems  Constitutional: Negative for fever, chills and diaphoresis.  HENT: Negative for congestion, rhinorrhea and sore throat.   Respiratory: Negative for cough, shortness of breath and wheezing.   Cardiovascular: Negative for chest pain.  Gastrointestinal: Negative for nausea, vomiting, abdominal pain, diarrhea, constipation and abdominal distention.  Genitourinary: Negative for dysuria and frequency.  Musculoskeletal: Negative for joint swelling and arthralgias.  Skin: Negative for  rash.  Neurological: Negative for headaches.    Objective:  BP 103/66 mmHg  Pulse 59  Temp(Src) 97.3 F (36.3 C) (Oral)  Ht 5\' 10"  (1.778 m)  Wt 182 lb (82.555 kg)  BMI 26.11 kg/m2  BP Readings from Last 3 Encounters:  10/29/14 103/66    08/28/14 124/80  08/10/14 139/83    Wt Readings from Last 3 Encounters:  10/29/14 182 lb (82.555 kg)  08/28/14 193 lb (87.544 kg)  08/10/14 190 lb 9.6 oz (86.456 kg)     Physical Exam  Constitutional: He is oriented to person, place, and time. He appears well-developed and well-nourished. No distress.  HENT:  Head: Normocephalic and atraumatic.  Right Ear: External ear normal.  Left Ear: External ear normal.  Nose: Nose normal.  Mouth/Throat: Oropharynx is clear and moist.  Eyes: Conjunctivae and EOM are normal. Pupils are equal, round, and reactive to light.  Neck: Normal range of motion. Neck supple. No thyromegaly present.  Cardiovascular: Normal rate, regular rhythm and normal heart sounds.   No murmur heard. Pulmonary/Chest: Effort normal and breath sounds normal. No respiratory distress. He has no wheezes. He has no rales.  Abdominal: Soft. Bowel sounds are normal. He exhibits no distension. There is no tenderness.  Musculoskeletal: He exhibits edema and tenderness (right foot).  Lymphadenopathy:    He has no cervical adenopathy.  Neurological: He is alert and oriented to person, place, and time. He has normal reflexes.  Skin: Skin is warm and dry.  Psychiatric: He has a normal mood and affect. His behavior is normal. Judgment and thought content normal.    Lab Results  Component Value Date   HGBA1C 4.9% 07/24/2013    Lab Results  Component Value Date   WBC 7.4 08/11/2014   HGB 13.1 08/11/2014   HCT 40.5 08/11/2014   PLT 294 08/11/2014   GLUCOSE 87 08/11/2014   CHOL 99* 08/11/2014   TRIG 53 08/11/2014   HDL 37* 08/11/2014   LDLCALC 64 07/24/2013   ALT 22 08/11/2014   AST 19 08/11/2014   NA 144 08/11/2014   K 3.7 08/11/2014   CL 103 08/11/2014   CREATININE 1.10 08/11/2014   BUN 14 08/11/2014   CO2 21 08/11/2014   TSH 3.310 08/11/2014   PSA 8.1* 08/11/2014   HGBA1C 4.9% 07/24/2013    No results found.  Assessment & Plan:   Gabriel Poole was seen  today for medication refill and dizziness.  Diagnoses and all orders for this visit:  Acute idiopathic gout of right foot Orders: -     HYDROcodone-acetaminophen (NORCO/VICODIN) 5-325 MG per tablet; Take 1 tablet by mouth every 6 (six) hours as needed. -     allopurinol (ZYLOPRIM) 100 MG tablet; Take 1 tablet (100 mg total) by mouth daily.  Elevated PSA, less than 10 ng/ml  Essential hypertension, benign  HLD (hyperlipidemia)  Dizziness and giddiness   I am having Gabriel Poole maintain his aspirin, triamterene-hydrochlorothiazide, metoprolol, atorvastatin, amLODipine, tamsulosin, Vitamin D (Ergocalciferol), HYDROcodone-acetaminophen, and allopurinol.  Meds ordered this encounter  Medications  . HYDROcodone-acetaminophen (NORCO/VICODIN) 5-325 MG per tablet    Sig: Take 1 tablet by mouth every 6 (six) hours as needed.    Dispense:  30 tablet    Refill:  0  . allopurinol (ZYLOPRIM) 100 MG tablet    Sig: Take 1 tablet (100 mg total) by mouth daily.    Dispense:  30 tablet    Refill:  6   regarding the dizzy spell. If it recurs  will need to discontinue his fluid pill. For now he was encouraged to stay hydrated particularly when exposed to the hot days outside.  Pt. Deferred urology referral. He was advised about the elevated PSA levels and potential implications. He says he will think about it and that me know next time he is in. Follow-up: Return in about 3 months (around 01/29/2015), or if symptoms worsen or fail to improve.  Claretta Fraise, M.D.

## 2014-10-31 DIAGNOSIS — R972 Elevated prostate specific antigen [PSA]: Secondary | ICD-10-CM | POA: Insufficient documentation

## 2014-11-09 ENCOUNTER — Ambulatory Visit: Payer: Medicare HMO | Admitting: Urology

## 2014-11-26 ENCOUNTER — Telehealth: Payer: Self-pay | Admitting: Family Medicine

## 2014-11-26 ENCOUNTER — Other Ambulatory Visit: Payer: Self-pay | Admitting: Family Medicine

## 2014-11-26 NOTE — Telephone Encounter (Signed)
He has to be seen due to narcotic nature of med. Others have refills because they are not narcotic. The follow up that he mentioned applied to them

## 2014-11-26 NOTE — Telephone Encounter (Signed)
Patient states that he just saw you last month and was told that he should follow up in September to have all medications refilled. Please advise.

## 2014-11-26 NOTE — Telephone Encounter (Signed)
Needs appt

## 2014-11-27 ENCOUNTER — Other Ambulatory Visit: Payer: Self-pay | Admitting: Family Medicine

## 2014-11-27 ENCOUNTER — Telehealth: Payer: Self-pay | Admitting: Family Medicine

## 2014-11-27 DIAGNOSIS — M10071 Idiopathic gout, right ankle and foot: Secondary | ICD-10-CM

## 2014-11-27 NOTE — Telephone Encounter (Signed)
Sent to [facility] to approve - he will be back on Wednesday.

## 2014-11-27 NOTE — Telephone Encounter (Signed)
Please see previous telephone encounter.

## 2014-11-29 ENCOUNTER — Ambulatory Visit (INDEPENDENT_AMBULATORY_CARE_PROVIDER_SITE_OTHER): Payer: Commercial Managed Care - HMO | Admitting: Family Medicine

## 2014-11-29 ENCOUNTER — Encounter: Payer: Self-pay | Admitting: Family Medicine

## 2014-11-29 VITALS — BP 127/73 | HR 66 | Temp 98.1°F | Ht 70.0 in | Wt 188.0 lb

## 2014-11-29 DIAGNOSIS — M79661 Pain in right lower leg: Secondary | ICD-10-CM | POA: Diagnosis not present

## 2014-11-29 DIAGNOSIS — I10 Essential (primary) hypertension: Secondary | ICD-10-CM | POA: Diagnosis not present

## 2014-11-29 DIAGNOSIS — M10071 Idiopathic gout, right ankle and foot: Secondary | ICD-10-CM

## 2014-11-29 MED ORDER — VITAMIN D 50 MCG (2000 UT) PO TABS: 2000.0000 [IU] | ORAL_TABLET | Freq: Every day | ORAL | Status: DC

## 2014-11-29 MED ORDER — COLCHICINE 0.6 MG PO TABS: 0.6000 mg | ORAL_TABLET | Freq: Every day | ORAL | Status: DC

## 2014-11-29 MED ORDER — HYDROCODONE-ACETAMINOPHEN 5-325 MG PO TABS: 1.0000 | ORAL_TABLET | Freq: Four times a day (QID) | ORAL | Status: DC | PRN

## 2014-11-29 NOTE — Progress Notes (Signed)
Subjective:  Patient ID: Gabriel Poole, male    DOB: 31-Oct-1945  Age: 69 y.o. MRN: [id number]  CC: Pain   HPI Gabriel Poole presents for continued pain in the right lower extremity. This is primarily at the posterior calf. There is a milder degree of pain at the posterior right thigh. He denies this being on the left. He denies back pain. He does have some pain in the foot. He has associated this pain with gout. However there is no specific joint involvement. Additionally it is in the foot generally nonspecific for the first MTP area. He denies swelling and erythema of the area. He has been taking hydrocodone from a 0-2 times daily over the last several months. The assumption has been that the pain was from gout and/or arthritis as well as a possible lumbar spinous process.   follow-up of hypertension. Patient has no history of headache chest pain or shortness of breath or recent cough. Patient also denies symptoms of TIA such as numbness weakness lateralizing. Patient checks  blood pressure at home and has not had any elevated readings recently. Patient denies side effects from his medication. States taking it regularly.   History Gabriel Poole has a past medical history of Hypertension; Arthritis; Heart murmur; Gout; and Hyperlipidemia.   He has past surgical history that includes Finger surgery and Hydrocele surgery (02/12/2011).   His family history includes Hypertension in his mother. There is no history of Hypotension, Anesthesia problems, Malignant hyperthermia, or Pseudochol deficiency.He reports that he quit smoking about 18 months ago. His smoking use included Cigarettes. He has a 22.5 pack-year smoking history. He has never used smokeless tobacco. He reports that he does not drink alcohol or use illicit drugs.  Outpatient Prescriptions Prior to Visit  Medication Sig Dispense Refill  . allopurinol (ZYLOPRIM) 100 MG tablet Take 1 tablet (100 mg total) by mouth daily. 30 tablet 6  .  amLODipine (NORVASC) 10 MG tablet Take 1 tablet (10 mg total) by mouth daily. 90 tablet 1  . aspirin 81 MG tablet Take 81 mg by mouth daily.    Marland Kitchen atorvastatin (LIPITOR) 40 MG tablet Take 1 tablet (40 mg total) by mouth daily at 6 PM. 90 tablet 1  . metoprolol (LOPRESSOR) 50 MG tablet Take 1 tablet (50 mg total) by mouth 2 (two) times daily. 180 tablet 1  . tamsulosin (FLOMAX) 0.4 MG CAPS capsule Take 2 capsules (0.8 mg total) by mouth daily after supper. For urine flow and prostate 60 capsule 3  . triamterene-hydrochlorothiazide (MAXZIDE-25) 37.5-25 MG per tablet Take 1 tablet by mouth daily. 90 tablet 3  . HYDROcodone-acetaminophen (NORCO/VICODIN) 5-325 MG per tablet Take 1 tablet by mouth every 6 (six) hours as needed. 30 tablet 0  . Vitamin D, Ergocalciferol, (DRISDOL) 50000 UNITS CAPS capsule Take 1 capsule (50,000 Units total) by mouth 2 (two) times a week. 16 capsule 0   No facility-administered medications prior to visit.    ROS Review of Systems  Constitutional: Negative for fever, chills and diaphoresis.  HENT: Negative for congestion, rhinorrhea and sore throat.   Respiratory: Negative for cough, shortness of breath and wheezing.   Cardiovascular: Negative for chest pain.  Gastrointestinal: Negative for nausea, vomiting, abdominal pain, diarrhea, constipation and abdominal distention.  Genitourinary: Negative for dysuria and frequency.  Musculoskeletal: Positive for myalgias, joint swelling and arthralgias.  Skin: Negative for rash.  Neurological: Negative for headaches.    Objective:  BP 127/73 mmHg  Pulse 66  Temp(Src) 98.1  F (36.7 C) (Oral)  Ht 5\' 10"  (1.778 m)  Wt 188 lb (85.276 kg)  BMI 26.98 kg/m2  BP Readings from Last 3 Encounters:  11/29/14 127/73  10/29/14 103/66  08/28/14 124/80    Wt Readings from Last 3 Encounters:  11/29/14 188 lb (85.276 kg)  10/29/14 182 lb (82.555 kg)  08/28/14 193 lb (87.544 kg)     Physical Exam  Constitutional: He is  oriented to person, place, and time. He appears well-developed and well-nourished. No distress.  HENT:  Head: Normocephalic and atraumatic.  Right Ear: External ear normal.  Left Ear: External ear normal.  Nose: Nose normal.  Mouth/Throat: Oropharynx is clear and moist.  Eyes: Conjunctivae and EOM are normal. Pupils are equal, round, and reactive to light.  Neck: Normal range of motion. Neck supple. No thyromegaly present.  Cardiovascular: Normal rate, regular rhythm and normal heart sounds.   No murmur heard. Pulmonary/Chest: Effort normal and breath sounds normal. No respiratory distress. He has no wheezes. He has no rales.  Abdominal: Soft. Bowel sounds are normal. He exhibits no distension. There is no tenderness.  Musculoskeletal: He exhibits tenderness (RLE at calf).  Lymphadenopathy:    He has no cervical adenopathy.  Neurological: He is alert and oriented to person, place, and time. He has normal reflexes.  Skin: Skin is warm and dry.  Psychiatric: He has a normal mood and affect. His behavior is normal. Judgment and thought content normal.    Lab Results  Component Value Date   HGBA1C 4.9% 07/24/2013    Lab Results  Component Value Date   WBC 7.4 08/11/2014   HGB 13.1 08/11/2014   HCT 40.5 08/11/2014   PLT 294 08/11/2014   GLUCOSE 87 08/11/2014   CHOL 99* 08/11/2014   TRIG 53 08/11/2014   HDL 37* 08/11/2014   LDLCALC 64 07/24/2013   ALT 22 08/11/2014   AST 19 08/11/2014   NA 144 08/11/2014   K 3.7 08/11/2014   CL 103 08/11/2014   CREATININE 1.10 08/11/2014   BUN 14 08/11/2014   CO2 21 08/11/2014   TSH 3.310 08/11/2014   PSA 8.1* 08/11/2014   HGBA1C 4.9% 07/24/2013    No results found.  Assessment & Plan:   Gabriel Poole was seen today for pain.  Diagnoses and all orders for this visit:  Pain of right lower leg Orders: -     HYDROcodone-acetaminophen (NORCO/VICODIN) 5-325 MG per tablet; Take 1 tablet by mouth every 6 (six) hours as needed. -     NCV  with EMG(electromyography); Future -     LE ART SEG MULTI (Segm & LE Reynauds); Future  Essential hypertension  Acute idiopathic gout of right foot Orders: -     colchicine 0.6 MG tablet; Take 1 tablet (0.6 mg total) by mouth daily. Take on every 2 hours for acute attack of gout, up to 6 doses. Then cut back to qd  Other orders -     Cholecalciferol (VITAMIN D) 2000 UNITS tablet; Take 1 tablet (2,000 Units total) by mouth daily.  I have discontinued Mr. Czerniak Vitamin D (Ergocalciferol). I am also having him start on Vitamin D and colchicine. Additionally, I am having him maintain his aspirin, triamterene-hydrochlorothiazide, metoprolol, atorvastatin, amLODipine, tamsulosin, allopurinol, and HYDROcodone-acetaminophen.  Meds ordered this encounter  Medications  . Cholecalciferol (VITAMIN D) 2000 UNITS tablet    Sig: Take 1 tablet (2,000 Units total) by mouth daily.    Dispense:  30 tablet    Refill:  11  .  HYDROcodone-acetaminophen (NORCO/VICODIN) 5-325 MG per tablet    Sig: Take 1 tablet by mouth every 6 (six) hours as needed.    Dispense:  30 tablet    Refill:  0  . colchicine 0.6 MG tablet    Sig: Take 1 tablet (0.6 mg total) by mouth daily. Take on every 2 hours for acute attack of gout, up to 6 doses. Then cut back to qd    Dispense:  50 tablet    Refill:  10     Follow-up: Return in about 1 month (around 12/30/2014) for pain.  Claretta Fraise, M.D.

## 2014-11-30 ENCOUNTER — Other Ambulatory Visit: Payer: Self-pay | Admitting: Family Medicine

## 2014-11-30 DIAGNOSIS — R52 Pain, unspecified: Secondary | ICD-10-CM

## 2014-12-05 ENCOUNTER — Ambulatory Visit (HOSPITAL_COMMUNITY): Payer: Medicare HMO

## 2014-12-06 ENCOUNTER — Other Ambulatory Visit: Payer: Self-pay | Admitting: *Deleted

## 2014-12-06 DIAGNOSIS — M5417 Radiculopathy, lumbosacral region: Secondary | ICD-10-CM

## 2014-12-06 DIAGNOSIS — M10071 Idiopathic gout, right ankle and foot: Secondary | ICD-10-CM

## 2014-12-06 DIAGNOSIS — I10 Essential (primary) hypertension: Secondary | ICD-10-CM

## 2014-12-06 MED ORDER — TRIAMTERENE-HCTZ 37.5-25 MG PO TABS: 1.0000 | ORAL_TABLET | Freq: Every day | ORAL | Status: DC

## 2014-12-06 MED ORDER — ALLOPURINOL 100 MG PO TABS: 100.0000 mg | ORAL_TABLET | Freq: Every day | ORAL | Status: DC

## 2014-12-06 MED ORDER — COLCHICINE 0.6 MG PO TABS: 0.6000 mg | ORAL_TABLET | Freq: Every day | ORAL | Status: DC

## 2014-12-06 MED ORDER — ATORVASTATIN CALCIUM 40 MG PO TABS: 40.0000 mg | ORAL_TABLET | Freq: Every day | ORAL | Status: DC

## 2014-12-06 MED ORDER — METOPROLOL TARTRATE 50 MG PO TABS: 50.0000 mg | ORAL_TABLET | Freq: Two times a day (BID) | ORAL | Status: DC

## 2014-12-06 MED ORDER — TAMSULOSIN HCL 0.4 MG PO CAPS: 0.8000 mg | ORAL_CAPSULE | Freq: Every day | ORAL | Status: DC

## 2014-12-10 ENCOUNTER — Ambulatory Visit (HOSPITAL_COMMUNITY)
Admission: RE | Admit: 2014-12-10 | Discharge: 2014-12-10 | Disposition: A | Discharge status: Home / Self Care | Payer: Commercial Managed Care - HMO | Source: Ambulatory Visit | Attending: Family Medicine | Admitting: Family Medicine

## 2014-12-10 DIAGNOSIS — M79661 Pain in right lower leg: Secondary | ICD-10-CM | POA: Insufficient documentation

## 2014-12-10 DIAGNOSIS — R52 Pain, unspecified: Secondary | ICD-10-CM

## 2014-12-28 ENCOUNTER — Telehealth: Payer: Self-pay | Admitting: Family Medicine

## 2014-12-28 ENCOUNTER — Other Ambulatory Visit: Payer: Self-pay | Admitting: Family Medicine

## 2014-12-28 DIAGNOSIS — M79661 Pain in right lower leg: Secondary | ICD-10-CM

## 2014-12-28 MED ORDER — HYDROCODONE-ACETAMINOPHEN 5-325 MG PO TABS: 1.0000 | ORAL_TABLET | Freq: Four times a day (QID) | ORAL | Status: DC | PRN

## 2014-12-28 NOTE — Telephone Encounter (Signed)
Please review and advise.

## 2014-12-28 NOTE — Telephone Encounter (Signed)
Pt requesting refill on Hydrocodone Please review and advise

## 2014-12-28 NOTE — Telephone Encounter (Signed)
Pt aware that he can pick up

## 2014-12-28 NOTE — Telephone Encounter (Signed)
Last filled and seen on 11/29/14.

## 2015-01-30 ENCOUNTER — Ambulatory Visit (INDEPENDENT_AMBULATORY_CARE_PROVIDER_SITE_OTHER): Payer: Commercial Managed Care - HMO | Admitting: Family Medicine

## 2015-01-30 ENCOUNTER — Encounter: Payer: Self-pay | Admitting: Family Medicine

## 2015-01-30 VITALS — BP 142/83 | HR 73 | Temp 97.2°F | Ht 70.0 in | Wt 185.6 lb

## 2015-01-30 DIAGNOSIS — M79661 Pain in right lower leg: Secondary | ICD-10-CM

## 2015-01-30 MED ORDER — HYDROCODONE-ACETAMINOPHEN 5-325 MG PO TABS: 1.0000 | ORAL_TABLET | Freq: Four times a day (QID) | ORAL | Status: DC | PRN

## 2015-01-30 NOTE — Progress Notes (Signed)
Subjective:  Patient ID: Gabriel Poole, male    DOB: 06-19-1945  Age: 69 y.o. MRN: [id number]  CC: Foot Pain   HPI IZEK CORVINO presents for toothache like pain in right calf and lateral foot. Had doppler of arteries read as normal.pain continues unimproved. Good relief using 0-2 hydrocodone  Per day. History Cruze has a past medical history of Hypertension; Arthritis; Heart murmur; Gout; and Hyperlipidemia.   He has past surgical history that includes Finger surgery and Hydrocele surgery (02/12/2011).   His family history includes Hypertension in his mother. There is no history of Hypotension, Anesthesia problems, Malignant hyperthermia, or Pseudochol deficiency.He reports that he quit smoking about 21 months ago. His smoking use included Cigarettes. He has a 22.5 pack-year smoking history. He has never used smokeless tobacco. He reports that he does not drink alcohol or use illicit drugs.  Outpatient Prescriptions Prior to Visit  Medication Sig Dispense Refill  . allopurinol (ZYLOPRIM) 100 MG tablet Take 1 tablet (100 mg total) by mouth daily. 90 tablet 0  . amLODipine (NORVASC) 10 MG tablet Take 1 tablet (10 mg total) by mouth daily. 90 tablet 1  . aspirin 81 MG tablet Take 81 mg by mouth daily.    Marland Kitchen atorvastatin (LIPITOR) 40 MG tablet Take 1 tablet (40 mg total) by mouth daily at 6 PM. 90 tablet 0  . Cholecalciferol (VITAMIN D) 2000 UNITS tablet Take 1 tablet (2,000 Units total) by mouth daily. 30 tablet 11  . colchicine 0.6 MG tablet Take 1 tablet (0.6 mg total) by mouth daily. 90 tablet 0  . metoprolol (LOPRESSOR) 50 MG tablet Take 1 tablet (50 mg total) by mouth 2 (two) times daily. 180 tablet 0  . tamsulosin (FLOMAX) 0.4 MG CAPS capsule Take 2 capsules (0.8 mg total) by mouth daily after supper. For urine flow and prostate 180 capsule 0  . triamterene-hydrochlorothiazide (MAXZIDE-25) 37.5-25 MG per tablet Take 1 tablet by mouth daily. 90 tablet 0  .  HYDROcodone-acetaminophen (NORCO/VICODIN) 5-325 MG per tablet Take 1 tablet by mouth every 6 (six) hours as needed. 30 tablet 0   No facility-administered medications prior to visit.    ROS Review of Systems  Constitutional: Negative for fever, chills and diaphoresis.  HENT: Negative for congestion, rhinorrhea and sore throat.   Respiratory: Negative for cough, shortness of breath and wheezing.   Cardiovascular: Negative for chest pain.  Gastrointestinal: Negative for nausea, vomiting, abdominal pain, diarrhea, constipation and abdominal distention.  Genitourinary: Negative for dysuria and frequency.  Musculoskeletal: Positive for arthralgias. Negative for joint swelling.  Skin: Negative for rash.  Neurological: Negative for headaches.    Objective:  BP 142/83 mmHg  Pulse 73  Temp(Src) 97.2 F (36.2 C) (Oral)  Ht 5\' 10"  (1.778 m)  Wt 185 lb 9.6 oz (84.188 kg)  BMI 26.63 kg/m2  BP Readings from Last 3 Encounters:  01/30/15 142/83  11/29/14 127/73  10/29/14 103/66    Wt Readings from Last 3 Encounters:  01/30/15 185 lb 9.6 oz (84.188 kg)  11/29/14 188 lb (85.276 kg)  10/29/14 182 lb (82.555 kg)     Physical Exam  Constitutional: He is oriented to person, place, and time. He appears well-developed and well-nourished.  HENT:  Head: Normocephalic and atraumatic.  Right Ear: Tympanic membrane and external ear normal. No decreased hearing is noted.  Left Ear: Tympanic membrane and external ear normal. No decreased hearing is noted.  Mouth/Throat: No oropharyngeal exudate or posterior oropharyngeal erythema.  Eyes: Pupils  are equal, round, and reactive to light.  Neck: Normal range of motion. Neck supple.  Cardiovascular: Normal rate, regular rhythm and intact distal pulses.   No murmur heard. Pulmonary/Chest: Breath sounds normal. No respiratory distress.  Abdominal: Soft. Bowel sounds are normal. He exhibits no mass. There is no tenderness.  Musculoskeletal: Normal  range of motion.  Neurological: He is alert and oriented to person, place, and time.  Vitals reviewed.   Lab Results  Component Value Date   HGBA1C 4.9% 07/24/2013    Lab Results  Component Value Date   WBC 7.4 08/11/2014   HGB 13.1 08/11/2014   HCT 40.5 08/11/2014   PLT 294 08/11/2014   GLUCOSE 87 08/11/2014   CHOL 99* 08/11/2014   TRIG 53 08/11/2014   HDL 37* 08/11/2014   LDLCALC 64 07/24/2013   ALT 22 08/11/2014   AST 19 08/11/2014   NA 144 08/11/2014   K 3.7 08/11/2014   CL 103 08/11/2014   CREATININE 1.10 08/11/2014   BUN 14 08/11/2014   CO2 21 08/11/2014   TSH 3.310 08/11/2014   PSA 8.1* 08/11/2014   HGBA1C 4.9% 07/24/2013    US Arterial Seg Single  12/10/2014   CLINICAL DATA:  69 year old male with a history of pain in the right calf.  Cardiovascular risk factors include smoking, hypertension, hyperlipidemia.  EXAM: NONINVASIVE PHYSIOLOGIC VASCULAR STUDY OF BILATERAL LOWER EXTREMITIES  TECHNIQUE: IMPRESSION: Resting ankle brachial index of the bilateral lower extremities demonstrates no evidence of arterial occlusive disease.  Abnormal Doppler signal at the bilateral ankles, may reflect developing arterial disease above the level of the ankles.   Assessment & Plan:   [name] was seen today for foot pain.  Diagnoses and all orders for this visit:  Pain of right lower leg -     HYDROcodone-acetaminophen (NORCO/VICODIN) 5-325 MG per tablet; Take 1 tablet by mouth every 6 (six) hours as needed. -     Cancel: LE ART SEG MULTI (Segm & LE Reynauds); Future -     NCV with EMG(electromyography); Future   I am having Mr. Tsang maintain his aspirin, amLODipine, Vitamin D, metoprolol, tamsulosin, triamterene-hydrochlorothiazide, allopurinol, atorvastatin, colchicine, and HYDROcodone-acetaminophen.  Meds ordered this encounter  Medications  . HYDROcodone-acetaminophen (NORCO/VICODIN) 5-325 MG per tablet    Sig: Take 1 tablet by mouth every 6 (six) hours as needed.     Dispense:  30 tablet    Refill:  0     Follow-up: Return in about 3 months (around 05/01/2015).  Claretta Fraise, M.D.

## 2015-02-04 DIAGNOSIS — R339 Retention of urine, unspecified: Secondary | ICD-10-CM | POA: Diagnosis not present

## 2015-02-04 DIAGNOSIS — F17200 Nicotine dependence, unspecified, uncomplicated: Secondary | ICD-10-CM | POA: Diagnosis not present

## 2015-02-04 DIAGNOSIS — Z79899 Other long term (current) drug therapy: Secondary | ICD-10-CM | POA: Diagnosis not present

## 2015-02-04 DIAGNOSIS — I10 Essential (primary) hypertension: Secondary | ICD-10-CM | POA: Diagnosis not present

## 2015-02-05 ENCOUNTER — Telehealth: Payer: Self-pay | Admitting: Family Medicine

## 2015-02-05 DIAGNOSIS — R972 Elevated prostate specific antigen [PSA]: Secondary | ICD-10-CM

## 2015-02-05 NOTE — Telephone Encounter (Signed)
Pt wants to see urologist in [place] Referral entered in [place]

## 2015-02-05 NOTE — Telephone Encounter (Signed)
Refer to urology.  ?

## 2015-02-05 NOTE — Telephone Encounter (Signed)
Would you like patient to come in for a follow up or do you want to go ahead with referral. Please advise and route to [facility]

## 2015-02-06 DIAGNOSIS — I10 Essential (primary) hypertension: Secondary | ICD-10-CM | POA: Diagnosis not present

## 2015-02-06 DIAGNOSIS — F17200 Nicotine dependence, unspecified, uncomplicated: Secondary | ICD-10-CM | POA: Diagnosis not present

## 2015-02-06 DIAGNOSIS — N400 Enlarged prostate without lower urinary tract symptoms: Secondary | ICD-10-CM | POA: Diagnosis not present

## 2015-02-06 DIAGNOSIS — N401 Enlarged prostate with lower urinary tract symptoms: Secondary | ICD-10-CM | POA: Diagnosis not present

## 2015-02-06 DIAGNOSIS — N3289 Other specified disorders of bladder: Secondary | ICD-10-CM | POA: Diagnosis not present

## 2015-02-07 ENCOUNTER — Telehealth: Payer: Self-pay | Admitting: Family Medicine

## 2015-02-12 ENCOUNTER — Other Ambulatory Visit: Payer: Self-pay | Admitting: Family Medicine

## 2015-02-12 DIAGNOSIS — R339 Retention of urine, unspecified: Secondary | ICD-10-CM | POA: Diagnosis not present

## 2015-02-12 DIAGNOSIS — I10 Essential (primary) hypertension: Secondary | ICD-10-CM | POA: Diagnosis not present

## 2015-02-12 DIAGNOSIS — F17200 Nicotine dependence, unspecified, uncomplicated: Secondary | ICD-10-CM | POA: Diagnosis not present

## 2015-02-12 DIAGNOSIS — Z79899 Other long term (current) drug therapy: Secondary | ICD-10-CM | POA: Diagnosis not present

## 2015-02-15 DIAGNOSIS — R339 Retention of urine, unspecified: Secondary | ICD-10-CM | POA: Diagnosis not present

## 2015-02-15 DIAGNOSIS — R3915 Urgency of urination: Secondary | ICD-10-CM | POA: Diagnosis not present

## 2015-02-15 DIAGNOSIS — R39198 Other difficulties with micturition: Secondary | ICD-10-CM | POA: Diagnosis not present

## 2015-02-22 ENCOUNTER — Telehealth: Payer: Self-pay | Admitting: Family Medicine

## 2015-02-22 NOTE — Telephone Encounter (Signed)
He would need to be seen. All opiate prescriptions required office visit

## 2015-02-22 NOTE — Telephone Encounter (Signed)
Stp and advised he would need appt. Pt given appt with Dr.Stacks 10/17 at 2:10.

## 2015-02-22 NOTE — Telephone Encounter (Signed)
Pt is requesting a refill on his hydrocodone. Last given 01/30/15#30 no refills.

## 2015-02-23 ENCOUNTER — Encounter: Payer: Self-pay | Admitting: Pediatrics

## 2015-02-23 ENCOUNTER — Ambulatory Visit (INDEPENDENT_AMBULATORY_CARE_PROVIDER_SITE_OTHER): Payer: Commercial Managed Care - HMO | Admitting: Pediatrics

## 2015-02-23 VITALS — BP 126/72 | HR 93 | Temp 97.6°F | Ht 70.0 in | Wt 177.6 lb

## 2015-02-23 DIAGNOSIS — R8290 Unspecified abnormal findings in urine: Secondary | ICD-10-CM | POA: Diagnosis not present

## 2015-02-23 DIAGNOSIS — R309 Painful micturition, unspecified: Secondary | ICD-10-CM

## 2015-02-23 LAB — POCT UA - MICROSCOPIC ONLY
CASTS, UR, LPF, POC: NEGATIVE
Crystals, Ur, HPF, POC: NEGATIVE
MUCUS UA: NEGATIVE
WBC, UR, HPF, POC: NEGATIVE
YEAST UA: NEGATIVE

## 2015-02-23 LAB — POCT URINALYSIS DIPSTICK

## 2015-02-23 NOTE — Progress Notes (Signed)
Subjective:    Patient ID: Gabriel Poole, male    DOB: [birth year], 69 y.o.   MRN: [id number]  CC: discolored urine  HPI: Gabriel Poole is a 69 y.o. male presenting on 02/23/2015 for Discolored Urine and Painful urination  Has had some burning with urination and intermittent discolored urine since catheter removal by urology 1 week ago. Cath was placed for urinary retention. He was started on flomax and has taken for past three days and says now he feels like he is emptying his bladder completely. Had catheter placed at [facility] after not being able to urinate at all 3 weeks ago. Last night noticed darker colored urine No bladder spasms, urgency, frequency. No fevers. Has otherwise been feeling well. Normal appetite. No swelling. Penis has felt irritated since catheter was placed.   Relevant past medical, surgical, family and social history reviewed and updated as indicated. Interim medical history since our last visit reviewed. Allergies and medications reviewed and updated.   ROS: Per HPI unless specifically indicated above  Past Medical History Patient Active Problem List   Diagnosis Date Noted  . Pain of right lower leg 11/29/2014  . Elevated PSA, less than 10 ng/ml 10/31/2014  . Noncompliance 09/21/2012  . HTN (hypertension) 09/21/2012  . HLD (hyperlipidemia) 09/21/2012  . Gout 09/21/2012  . Hydrocele 12/17/2010  . Tobacco abuse 12/17/2010    Current Outpatient Prescriptions  Medication Sig Dispense Refill  . allopurinol (ZYLOPRIM) 100 MG tablet TAKE 1 TABLET EVERY DAY 90 tablet 0  . amLODipine (NORVASC) 10 MG tablet Take 1 tablet (10 mg total) by mouth daily. 90 tablet 1  . aspirin 81 MG tablet Take 81 mg by mouth daily.    Marland Kitchen atorvastatin (LIPITOR) 40 MG tablet Take 1 tablet (40 mg total) by mouth daily at 6 PM. 90 tablet 0  . Cholecalciferol (VITAMIN D) 2000 UNITS tablet Take 1 tablet (2,000 Units total) by mouth daily. 30 tablet 11  . colchicine 0.6  MG tablet TAKE 1 TABLET EVERY DAY 90 tablet 0  . HYDROcodone-acetaminophen (NORCO/VICODIN) 5-325 MG per tablet Take 1 tablet by mouth every 6 (six) hours as needed. 30 tablet 0  . metoprolol (LOPRESSOR) 50 MG tablet Take 1 tablet (50 mg total) by mouth 2 (two) times daily. 180 tablet 0  . tamsulosin (FLOMAX) 0.4 MG CAPS capsule Take 2 capsules (0.8 mg total) by mouth daily after supper. For urine flow and prostate 180 capsule 0  . triamterene-hydrochlorothiazide (MAXZIDE-25) 37.5-25 MG tablet TAKE 1 TABLET EVERY DAY 90 tablet 1   No current facility-administered medications for this visit.       Objective:    BP 126/72 mmHg  Pulse 93  Temp(Src) 97.6 F (36.4 C) (Oral)  Ht 5\' 10"  (1.778 m)  Wt 177 lb 9.6 oz (80.559 kg)  BMI 25.48 kg/m2  Wt Readings from Last 3 Encounters:  02/23/15 177 lb 9.6 oz (80.559 kg)  01/30/15 185 lb 9.6 oz (84.188 kg)  11/29/14 188 lb (85.276 kg)    Gen: NAD, alert, cooperative with exam, NCAT EYES: EOMI, no scleral injection or icterus CV: NRRR, no murmur, WWP, no edema Resp: CTABL, no wheezes, normal WOB Abd: +BS, soft, NTND. no guarding or organomegaly, no suprapubic tenderness Neuro: Alert and oriented     Assessment & Plan:   Gabriel Poole was seen today for dysuria and intermittent discoloration of urine. UA with RBCs, no white cells. Likely due to irritation from catheter. Now urinating with good stream,  emptying bladder per pt. Will check BMP, most recent creatinine normal in 08/2014.   Diagnoses and all orders for this visit:  Urine abnormality -     POCT urinalysis dipstick -     POCT UA - Microscopic Only  Painful urination -     POCT urinalysis dipstick -     POCT UA - Microscopic Only  Follow up plan: As scheduled with PCP Dr. Marlene Bast, MD [facility] 02/23/2015, 11:12 AM

## 2015-02-24 LAB — BMP8+EGFR
BUN/Creatinine Ratio: 16 (ref 10–22)
BUN: 17 mg/dL (ref 8–27)
CALCIUM: 9.3 mg/dL (ref 8.6–10.2)
CO2: 24 mmol/L (ref 18–29)
Chloride: 104 mmol/L (ref 97–108)
Creatinine, Ser: 1.09 mg/dL (ref 0.76–1.27)
GFR calc Af Amer: 80 mL/min/{1.73_m2} (ref >59)
GFR calc non Af Amer: 69 mL/min/{1.73_m2} (ref >59)
GLUCOSE: 105 mg/dL — AB (ref 65–99)
POTASSIUM: 4.1 mmol/L (ref 3.5–5.2)
Sodium: 145 mmol/L — ABNORMAL HIGH (ref 134–144)

## 2015-02-25 ENCOUNTER — Ambulatory Visit (INDEPENDENT_AMBULATORY_CARE_PROVIDER_SITE_OTHER): Payer: Commercial Managed Care - HMO | Admitting: Family Medicine

## 2015-02-25 VITALS — BP 124/74 | HR 78 | Temp 97.3°F | Ht 70.0 in | Wt 178.2 lb

## 2015-02-25 DIAGNOSIS — M79661 Pain in right lower leg: Secondary | ICD-10-CM

## 2015-02-25 DIAGNOSIS — M1A9XX0 Chronic gout, unspecified, without tophus (tophi): Secondary | ICD-10-CM

## 2015-02-25 DIAGNOSIS — Z23 Encounter for immunization: Secondary | ICD-10-CM

## 2015-02-25 DIAGNOSIS — I10 Essential (primary) hypertension: Secondary | ICD-10-CM

## 2015-02-25 MED ORDER — HYDROCODONE-ACETAMINOPHEN 5-325 MG PO TABS: 1.0000 | ORAL_TABLET | Freq: Four times a day (QID) | ORAL | Status: DC | PRN

## 2015-02-25 MED ORDER — AMLODIPINE BESYLATE 10 MG PO TABS: 10.0000 mg | ORAL_TABLET | Freq: Every day | ORAL | Status: DC

## 2015-02-25 MED ORDER — TAMSULOSIN HCL 0.4 MG PO CAPS: 0.8000 mg | ORAL_CAPSULE | Freq: Every day | ORAL | Status: DC

## 2015-02-25 MED ORDER — METOPROLOL TARTRATE 50 MG PO TABS: 50.0000 mg | ORAL_TABLET | Freq: Two times a day (BID) | ORAL | Status: DC

## 2015-02-25 NOTE — Progress Notes (Signed)
Subjective:  Patient ID: Gabriel Poole, male    DOB: 1945/06/20  Age: 69 y.o. MRN: [id number]  CC: right foot pain   HPI [name] presents for recurrent gout. Had blood in urine noted 3 days ago. Recently had foley cath. Urologist felt it was irritation from the catheter. He is due for urinalysis today as follow-up.  Off feet  A lot this month and had less foot pain. However he has to take a hydrocodone at bedtime for sleep due to chronic pain.   follow-up of hypertension. Patient has no history of headache chest pain or shortness of breath or recent cough. Patient also denies symptoms of TIA such as numbness weakness lateralizing. Patient checks  blood pressure at home and has not had any elevated readings recently. Patient denies side effects from his medication. States taking it regularly.   History [name] has a past medical history of Hypertension; Arthritis; Heart murmur; Gout; and Hyperlipidemia.   He has past surgical history that includes Finger surgery and Hydrocele surgery (02/12/2011).   His family history includes Hypertension in his mother. There is no history of Hypotension, Anesthesia problems, Malignant hyperthermia, or Pseudochol deficiency.He reports that he quit smoking about 21 months ago. His smoking use included Cigarettes. He has a 22.5 pack-year smoking history. He has never used smokeless tobacco. He reports that he does not drink alcohol or use illicit drugs.  Outpatient Prescriptions Prior to Visit  Medication Sig Dispense Refill  . allopurinol (ZYLOPRIM) 100 MG tablet TAKE 1 TABLET EVERY DAY 90 tablet 0  . aspirin 81 MG tablet Take 81 mg by mouth daily.    Marland Kitchen atorvastatin (LIPITOR) 40 MG tablet Take 1 tablet (40 mg total) by mouth daily at 6 PM. 90 tablet 0  . Cholecalciferol (VITAMIN D) 2000 UNITS tablet Take 1 tablet (2,000 Units total) by mouth daily. 30 tablet 11  . colchicine 0.6 MG tablet TAKE 1 TABLET EVERY DAY 90 tablet 0  .  triamterene-hydrochlorothiazide (MAXZIDE-25) 37.5-25 MG tablet TAKE 1 TABLET EVERY DAY 90 tablet 1  . amLODipine (NORVASC) 10 MG tablet Take 1 tablet (10 mg total) by mouth daily. 90 tablet 1  . HYDROcodone-acetaminophen (NORCO/VICODIN) 5-325 MG per tablet Take 1 tablet by mouth every 6 (six) hours as needed. 30 tablet 0  . metoprolol (LOPRESSOR) 50 MG tablet Take 1 tablet (50 mg total) by mouth 2 (two) times daily. 180 tablet 0  . tamsulosin (FLOMAX) 0.4 MG CAPS capsule Take 2 capsules (0.8 mg total) by mouth daily after supper. For urine flow and prostate 180 capsule 0   No facility-administered medications prior to visit.    ROS Review of Systems  Objective:  BP 124/74 mmHg  Pulse 78  Temp(Src) 97.3 F (36.3 C) (Oral)  Ht 5\' 10"  (1.778 m)  Wt 178 lb 3.2 oz (80.831 kg)  BMI 25.57 kg/m2  BP Readings from Last 3 Encounters:  02/25/15 124/74  02/23/15 126/72  01/30/15 142/83    Wt Readings from Last 3 Encounters:  02/25/15 178 lb 3.2 oz (80.831 kg)  02/23/15 177 lb 9.6 oz (80.559 kg)  01/30/15 185 lb 9.6 oz (84.188 kg)     Physical Exam  Lab Results  Component Value Date   HGBA1C 4.9% 07/24/2013    Lab Results  Component Value Date   WBC 7.4 08/11/2014   HGB 13.1 08/11/2014   HCT 40.5 08/11/2014   PLT 294 08/11/2014   GLUCOSE 105* 02/23/2015   CHOL 99* 08/11/2014  TRIG 53 08/11/2014   HDL 37* 08/11/2014   LDLCALC 64 07/24/2013   ALT 22 08/11/2014   AST 19 08/11/2014   NA 145* 02/23/2015   K 4.1 02/23/2015   CL 104 02/23/2015   CREATININE 1.09 02/23/2015   BUN 17 02/23/2015   CO2 24 02/23/2015   TSH 3.310 08/11/2014   PSA 8.1* 08/11/2014   HGBA1C 4.9% 07/24/2013    US Arterial Seg Single  12/10/2014  CLINICAL DATA:  69 year old male with a history of pain in the right calf. Cardiovascular risk factors include smoking, hypertension, hyperlipidemia. EXAM: NONINVASIVE PHYSIOLOGIC VASCULAR STUDY OF BILATERAL LOWER EXTREMITIES TECHNIQUE: Evaluation of both  lower extremities was performed at rest, including calculation of ankle-brachial indices, multiple segmental pressure evaluation, segmental Doppler and segmental pulse volume recording. COMPARISON:  None. FINDINGS: Right: Resting ankle brachial index:  1.13 Doppler: Segmental Doppler at the right ankle demonstrates abnormal waveform at the posterior tibial artery and dorsalis pedis, appearing biphasic. Left: Resting ankle brachial index: 1.13 Doppler: Segmental Doppler of the left lower extremity demonstrates abnormal waveform of the posterior tibial artery and dorsalis pedis, appearing biphasic. Additional: IMPRESSION: Resting ankle brachial index of the bilateral lower extremities demonstrates no evidence of arterial occlusive disease. Abnormal Doppler signal at the bilateral ankles, may reflect developing arterial disease above the level of the ankles. Signed, Dulcy Fanny. Earleen Newport, DO [facility] Electronically Signed   By: Corrie Mckusick D.O.   On: 12/10/2014 14:56    Assessment & Plan:   [name] was seen today for right foot pain.  Diagnoses and all orders for this visit:  Essential hypertension, benign -     metoprolol (LOPRESSOR) 50 MG tablet; Take 1 tablet (50 mg total) by mouth 2 (two) times daily.  Pain of right lower leg -     HYDROcodone-acetaminophen (NORCO/VICODIN) 5-325 MG tablet; Take 1 tablet by mouth every 6 (six) hours as needed.  Essential hypertension -     amLODipine (NORVASC) 10 MG tablet; Take 1 tablet (10 mg total) by mouth daily.  Encounter for immunization  Chronic gout without tophus, unspecified cause, unspecified site  Other orders -     HYDROcodone-acetaminophen (NORCO) 5-325 MG tablet; Take 1 tablet by mouth every 6 (six) hours as needed for moderate pain. -     HYDROcodone-acetaminophen (NORCO) 5-325 MG tablet; Take 1 tablet by mouth every 6 (six) hours as needed for moderate pain. -     tamsulosin  (FLOMAX) 0.4 MG CAPS capsule; Take 2 capsules (0.8 mg total) by mouth daily after supper. For urine flow and prostate -     Flu Vaccine QUAD 36+ mos IM -     Pneumococcal conjugate vaccine 13-valent   I have changed Mr. Wilber HYDROcodone-acetaminophen. I am also having him start on HYDROcodone-acetaminophen and HYDROcodone-acetaminophen. Additionally, I am having him maintain his aspirin, Vitamin D, atorvastatin, colchicine, triamterene-hydrochlorothiazide, allopurinol, metoprolol, amLODipine, and tamsulosin.  Meds ordered this encounter  Medications  . metoprolol (LOPRESSOR) 50 MG tablet    Sig: Take 1 tablet (50 mg total) by mouth 2 (two) times daily.    Dispense:  180 tablet    Refill:  3  . HYDROcodone-acetaminophen (NORCO/VICODIN) 5-325 MG tablet    Sig: Take 1 tablet by mouth every 6 (six) hours as needed.    Dispense:  30 tablet    Refill:  0  . HYDROcodone-acetaminophen (NORCO) 5-325 MG tablet    Sig: Take 1 tablet by mouth every 6 (six) hours  as needed for moderate pain.    Dispense:  30 tablet    Refill:  0    Do not fill until Nov. 16, 2016  . HYDROcodone-acetaminophen (NORCO) 5-325 MG tablet    Sig: Take 1 tablet by mouth every 6 (six) hours as needed for moderate pain.    Dispense:  30 tablet    Refill:  0    Do not fill until Dec. 16, 2016  . amLODipine (NORVASC) 10 MG tablet    Sig: Take 1 tablet (10 mg total) by mouth daily.    Dispense:  90 tablet    Refill:  1  . tamsulosin (FLOMAX) 0.4 MG CAPS capsule    Sig: Take 2 capsules (0.8 mg total) by mouth daily after supper. For urine flow and prostate    Dispense:  60 capsule    Refill:  11     Follow-up: Return in about 3 months (around 05/28/2015) for hypertension, Pain.  Claretta Fraise, M.D.

## 2015-02-27 ENCOUNTER — Encounter: Payer: Self-pay | Admitting: Family Medicine

## 2015-03-01 DIAGNOSIS — R339 Retention of urine, unspecified: Secondary | ICD-10-CM | POA: Diagnosis not present

## 2015-03-01 DIAGNOSIS — R3915 Urgency of urination: Secondary | ICD-10-CM | POA: Diagnosis not present

## 2015-03-04 ENCOUNTER — Encounter: Payer: Commercial Managed Care - HMO | Admitting: Family Medicine

## 2015-03-05 ENCOUNTER — Encounter: Payer: Self-pay | Admitting: Family Medicine

## 2015-03-07 ENCOUNTER — Ambulatory Visit (INDEPENDENT_AMBULATORY_CARE_PROVIDER_SITE_OTHER): Payer: Commercial Managed Care - HMO | Admitting: Family Medicine

## 2015-03-07 ENCOUNTER — Encounter: Payer: Self-pay | Admitting: Family Medicine

## 2015-03-07 VITALS — BP 115/66 | HR 93 | Temp 98.1°F | Ht 70.0 in | Wt 179.2 lb

## 2015-03-07 DIAGNOSIS — R319 Hematuria, unspecified: Secondary | ICD-10-CM

## 2015-03-07 LAB — POCT URINALYSIS DIPSTICK

## 2015-03-07 LAB — POCT UA - MICROSCOPIC ONLY
Bacteria, U Microscopic: NEGATIVE
Casts, Ur, LPF, POC: NEGATIVE
Crystals, Ur, HPF, POC: NEGATIVE
MUCUS UA: NEGATIVE
WBC, UR, HPF, POC: NEGATIVE
YEAST UA: NEGATIVE

## 2015-03-07 MED ORDER — CEPHALEXIN 500 MG PO CAPS: 500.0000 mg | ORAL_CAPSULE | Freq: Four times a day (QID) | ORAL | Status: DC

## 2015-03-07 NOTE — Progress Notes (Signed)
BP 115/66 mmHg  Pulse 93  Temp(Src) 98.1 F (36.7 C) (Oral)  Ht 5\' 10"  (1.778 m)  Wt 179 lb 3.2 oz (81.285 kg)  BMI 25.71 kg/m2   Subjective:    Patient ID: Gabriel Poole, male    DOB: 03-02-1946, 69 y.o.   MRN: [id number]  HPI: Gabriel Poole is a 69 y.o. male presenting on 03/07/2015 for Urinary Tract Infection   HPI Hematuria Patient presents today with frank hematuria that just started up again yesterday. He had a Foley placed 3 weeks ago that was removed 2 weeks ago and had some hematuria after that. But that resolved and this is an improvement since it started up just yesterday. He denies any dysuria or abdominal pain or difficulty urinating. He did just see urology a week ago but they cleared him for 6 months. With this new signs he said he could call them up and trying an appointment for next week. He denies any fevers or chills. He denies any difficulty starting or stopping his urine. He denies any penile pain or gonadal pain.  Relevant past medical, surgical, family and social history reviewed and updated as indicated. Interim medical history since our last visit reviewed. Allergies and medications reviewed and updated.  Review of Systems  Constitutional: Negative for fever.  HENT: Negative for ear discharge and ear pain.   Eyes: Negative for discharge and visual disturbance.  Respiratory: Negative for shortness of breath and wheezing.   Cardiovascular: Negative for chest pain and leg swelling.  Gastrointestinal: Negative for abdominal pain, diarrhea, constipation and abdominal distention.  Genitourinary: Positive for frequency and hematuria. Negative for dysuria, urgency, flank pain, decreased urine volume, discharge, penile swelling, scrotal swelling, difficulty urinating, genital sores, penile pain and testicular pain.  Musculoskeletal: Negative for back pain and gait problem.  Skin: Negative for rash.  Neurological: Negative for syncope, light-headedness and  headaches.  All other systems reviewed and are negative.   Per HPI unless specifically indicated above     Medication List       This list is accurate as of: 03/07/15  1:25 PM.  Always use your most recent med list.               allopurinol 100 MG tablet  Commonly known as:  ZYLOPRIM  TAKE 1 TABLET EVERY DAY     amLODipine 10 MG tablet  Commonly known as:  NORVASC  Take 1 tablet (10 mg total) by mouth daily.     aspirin 81 MG tablet  Take 81 mg by mouth daily.     atorvastatin 40 MG tablet  Commonly known as:  LIPITOR  Take 1 tablet (40 mg total) by mouth daily at 6 PM.     cephALEXin 500 MG capsule  Commonly known as:  KEFLEX  Take 1 capsule (500 mg total) by mouth 4 (four) times daily.     colchicine 0.6 MG tablet  TAKE 1 TABLET EVERY DAY     HYDROcodone-acetaminophen 5-325 MG tablet  Commonly known as:  NORCO  Take 1 tablet by mouth every 6 (six) hours as needed for moderate pain.     metoprolol 50 MG tablet  Commonly known as:  LOPRESSOR  Take 1 tablet (50 mg total) by mouth 2 (two) times daily.     tamsulosin 0.4 MG Caps capsule  Commonly known as:  FLOMAX  Take 2 capsules (0.8 mg total) by mouth daily after supper. For urine flow and prostate  triamterene-hydrochlorothiazide 37.5-25 MG tablet  Commonly known as:  MAXZIDE-25  TAKE 1 TABLET EVERY DAY     Vitamin D 2000 UNITS tablet  Take 1 tablet (2,000 Units total) by mouth daily.           Objective:    BP 115/66 mmHg  Pulse 93  Temp(Src) 98.1 F (36.7 C) (Oral)  Ht 5\' 10"  (1.778 m)  Wt 179 lb 3.2 oz (81.285 kg)  BMI 25.71 kg/m2  Wt Readings from Last 3 Encounters:  03/07/15 179 lb 3.2 oz (81.285 kg)  02/25/15 178 lb 3.2 oz (80.831 kg)  02/23/15 177 lb 9.6 oz (80.559 kg)    Physical Exam  Constitutional: He is oriented to person, place, and time. He appears well-developed and well-nourished. No distress.  Eyes: Conjunctivae and EOM are normal. Pupils are equal, round, and  reactive to light. Right eye exhibits no discharge. No scleral icterus.  Cardiovascular: Normal rate, regular rhythm, normal heart sounds and intact distal pulses.   No murmur heard. Pulmonary/Chest: Effort normal and breath sounds normal. No respiratory distress. He has no wheezes.  Abdominal: He exhibits no distension. There is no tenderness. There is no rebound. Hernia confirmed negative in the right inguinal area and confirmed negative in the left inguinal area.  Genitourinary: Testes normal and penis normal. Cremasteric reflex is present. Uncircumcised. No penile erythema or penile tenderness. No discharge found.  Musculoskeletal: Normal range of motion. He exhibits no edema.  Lymphadenopathy:       Right: No inguinal adenopathy present.       Left: No inguinal adenopathy present.  Neurological: He is alert and oriented to person, place, and time. Coordination normal.  Skin: Skin is warm and dry. No rash noted. He is not diaphoretic.  Psychiatric: He has a normal mood and affect. His behavior is normal.  Vitals reviewed.   Results for orders placed or performed in visit on 03/07/15  POCT UA - Microscopic Only  Result Value Ref Range   WBC, Ur, HPF, POC neg    RBC, urine, microscopic TNTC    Bacteria, U Microscopic neg    Mucus, UA neg    Epithelial cells, urine per micros occ    Crystals, Ur, HPF, POC neg    Casts, Ur, LPF, POC neg    Yeast, UA neg   POCT urinalysis dipstick  Result Value Ref Range   Color, UA black    Clarity, UA cloudy    Glucose, UA color interference    Bilirubin, UA color interference    Ketones, UA color interference    Blood, UA color interference    Protein, UA color interference    Nitrite, UA color interference       Assessment & Plan:   Problem List Items Addressed This Visit    None    Visit Diagnoses    Hematuria    -  Primary    Might still be from Foley trauma, will give Keflex and sent back to urology    Relevant Medications     cephALEXin (KEFLEX) 500 MG capsule    Other Relevant Orders    POCT UA - Microscopic Only (Completed)    POCT urinalysis dipstick (Completed)    Ambulatory referral to Urology        Follow up plan: Return if symptoms worsen or fail to improve.  Caryl Pina, MD [facility] 03/07/2015, 1:25 PM

## 2015-03-11 DIAGNOSIS — R300 Dysuria: Secondary | ICD-10-CM | POA: Diagnosis not present

## 2015-03-11 DIAGNOSIS — Z79899 Other long term (current) drug therapy: Secondary | ICD-10-CM | POA: Diagnosis not present

## 2015-03-11 DIAGNOSIS — R339 Retention of urine, unspecified: Secondary | ICD-10-CM | POA: Diagnosis not present

## 2015-03-11 DIAGNOSIS — R1030 Lower abdominal pain, unspecified: Secondary | ICD-10-CM | POA: Diagnosis not present

## 2015-03-11 DIAGNOSIS — I10 Essential (primary) hypertension: Secondary | ICD-10-CM | POA: Diagnosis not present

## 2015-03-11 DIAGNOSIS — F17200 Nicotine dependence, unspecified, uncomplicated: Secondary | ICD-10-CM | POA: Diagnosis not present

## 2015-03-18 ENCOUNTER — Telehealth: Payer: Self-pay | Admitting: *Deleted

## 2015-03-18 NOTE — Telephone Encounter (Signed)
Received fax from [facility] requesting a refill for oxybutynin ER 10mg  tabs QD. Do not see anywhere on med list or in history. Please advise and route to Pool B so nurse can phone in to pharmacy

## 2015-03-19 NOTE — Telephone Encounter (Signed)
Spoke with [facility] regarding RX We did not prescribe requested med They will send to prescribing Dr

## 2015-03-26 DIAGNOSIS — F17200 Nicotine dependence, unspecified, uncomplicated: Secondary | ICD-10-CM | POA: Diagnosis not present

## 2015-03-26 DIAGNOSIS — R310 Gross hematuria: Secondary | ICD-10-CM | POA: Diagnosis not present

## 2015-03-26 DIAGNOSIS — R339 Retention of urine, unspecified: Secondary | ICD-10-CM | POA: Diagnosis not present

## 2015-03-26 DIAGNOSIS — R8271 Bacteriuria: Secondary | ICD-10-CM | POA: Diagnosis not present

## 2015-03-28 DIAGNOSIS — Z538 Procedure and treatment not carried out for other reasons: Secondary | ICD-10-CM | POA: Diagnosis not present

## 2015-03-28 DIAGNOSIS — R339 Retention of urine, unspecified: Secondary | ICD-10-CM | POA: Diagnosis not present

## 2015-04-11 DIAGNOSIS — R310 Gross hematuria: Secondary | ICD-10-CM | POA: Diagnosis not present

## 2015-04-11 DIAGNOSIS — Z888 Allergy status to other drugs, medicaments and biological substances status: Secondary | ICD-10-CM | POA: Diagnosis not present

## 2015-04-11 DIAGNOSIS — N401 Enlarged prostate with lower urinary tract symptoms: Secondary | ICD-10-CM | POA: Diagnosis not present

## 2015-04-11 DIAGNOSIS — R339 Retention of urine, unspecified: Secondary | ICD-10-CM | POA: Diagnosis not present

## 2015-04-11 DIAGNOSIS — Z79899 Other long term (current) drug therapy: Secondary | ICD-10-CM | POA: Diagnosis not present

## 2015-04-11 DIAGNOSIS — I10 Essential (primary) hypertension: Secondary | ICD-10-CM | POA: Diagnosis not present

## 2015-04-11 DIAGNOSIS — F17200 Nicotine dependence, unspecified, uncomplicated: Secondary | ICD-10-CM | POA: Diagnosis not present

## 2015-04-11 DIAGNOSIS — R3912 Poor urinary stream: Secondary | ICD-10-CM | POA: Diagnosis not present

## 2015-04-11 DIAGNOSIS — R350 Frequency of micturition: Secondary | ICD-10-CM | POA: Diagnosis not present

## 2015-04-11 DIAGNOSIS — N400 Enlarged prostate without lower urinary tract symptoms: Secondary | ICD-10-CM | POA: Diagnosis not present

## 2015-04-23 DIAGNOSIS — R300 Dysuria: Secondary | ICD-10-CM | POA: Diagnosis not present

## 2015-05-08 ENCOUNTER — Encounter: Payer: Self-pay | Admitting: Family Medicine

## 2015-05-08 ENCOUNTER — Ambulatory Visit (INDEPENDENT_AMBULATORY_CARE_PROVIDER_SITE_OTHER): Payer: Commercial Managed Care - HMO | Admitting: Family Medicine

## 2015-05-08 VITALS — BP 136/90 | HR 66 | Temp 99.6°F | Ht 70.0 in | Wt 177.6 lb

## 2015-05-08 DIAGNOSIS — J209 Acute bronchitis, unspecified: Secondary | ICD-10-CM | POA: Diagnosis not present

## 2015-05-08 DIAGNOSIS — E785 Hyperlipidemia, unspecified: Secondary | ICD-10-CM | POA: Diagnosis not present

## 2015-05-08 DIAGNOSIS — I10 Essential (primary) hypertension: Secondary | ICD-10-CM | POA: Diagnosis not present

## 2015-05-08 MED ORDER — METOPROLOL TARTRATE 50 MG PO TABS: 50.0000 mg | ORAL_TABLET | Freq: Two times a day (BID) | ORAL | Status: DC

## 2015-05-08 MED ORDER — PREDNISONE 20 MG PO TABS: ORAL_TABLET | ORAL | Status: DC

## 2015-05-08 MED ORDER — AZITHROMYCIN 250 MG PO TABS: ORAL_TABLET | ORAL | Status: DC

## 2015-05-08 MED ORDER — ATORVASTATIN CALCIUM 40 MG PO TABS: 40.0000 mg | ORAL_TABLET | Freq: Every day | ORAL | Status: DC

## 2015-05-08 MED ORDER — AMLODIPINE BESYLATE 10 MG PO TABS: 10.0000 mg | ORAL_TABLET | Freq: Every day | ORAL | Status: DC

## 2015-05-08 MED ORDER — ALBUTEROL SULFATE HFA 108 (90 BASE) MCG/ACT IN AERS: 2.0000 | INHALATION_SPRAY | RESPIRATORY_TRACT | Status: DC | PRN

## 2015-05-08 NOTE — Progress Notes (Signed)
BP 136/90 mmHg  Pulse 66  Temp(Src) 99.6 F (37.6 C) (Oral)  Ht 5\' 10"  (1.778 m)  Wt 177 lb 9.6 oz (80.559 kg)  BMI 25.48 kg/m2   Subjective:    Patient ID: Gabriel Poole, male    DOB: 06-Jun-1945, 70 y.o.   MRN: [id number]  HPI: Gabriel Poole is a 69 y.o. male presenting on 05/08/2015 for Chest congestion and Cough   HPI Chest congestion and cough Patient has a 5 day history of chest congestion and cough and sinus congestion and postnasal drainage. His cough is productive of white to yellow sputum. He denies any fevers or chills or shortness of breath or wheezing. He denies any known sick contacts. He has used Tylenol Sinus and cold which helped some.  Hypertension Patient comes in for hypertension recheck and blood pressure today is. His blood pressure is elevated 136/90 today but he has not taken his medications yet this morning. He normally takes amlodipine and metoprolol. Patient denies headaches, blurred vision, chest pains, shortness of breath, or weakness. Denies any side effects from medication and is content with current medication.   Hyperlipidemia Patient needs a refill on his cholesterol medication which is Lipitor 40 mg. He denies any muscle cramps or abdominal cramps or issues with this. He is not quite due for a recheck of the lab.  Relevant past medical, surgical, family and social history reviewed and updated as indicated. Interim medical history since our last visit reviewed. Allergies and medications reviewed and updated.  Review of Systems  Constitutional: Negative for fever and chills.  HENT: Positive for congestion, postnasal drip, rhinorrhea, sinus pressure and sore throat. Negative for ear discharge, ear pain, sneezing and voice change.   Eyes: Negative for pain, discharge, redness and visual disturbance.  Respiratory: Positive for cough. Negative for chest tightness, shortness of breath and wheezing.   Cardiovascular: Negative for chest pain,  palpitations and leg swelling.  Gastrointestinal: Negative for abdominal pain, diarrhea and constipation.  Genitourinary: Negative for difficulty urinating.  Musculoskeletal: Negative for back pain and gait problem.  Skin: Negative for rash.  Neurological: Negative for syncope, light-headedness and headaches.  All other systems reviewed and are negative.   Per HPI unless specifically indicated above     Medication List       This list is accurate as of: 05/08/15 11:37 AM.  Always use your most recent med list.               albuterol 108 (90 Base) MCG/ACT inhaler  Commonly known as:  PROVENTIL HFA;VENTOLIN HFA  Inhale 2 puffs into the lungs every 4 (four) hours as needed for wheezing or shortness of breath.     allopurinol 100 MG tablet  Commonly known as:  ZYLOPRIM  TAKE 1 TABLET EVERY DAY     amLODipine 10 MG tablet  Commonly known as:  NORVASC  Take 1 tablet (10 mg total) by mouth daily.     aspirin 81 MG tablet  Take 81 mg by mouth daily.     atorvastatin 40 MG tablet  Commonly known as:  LIPITOR  Take 1 tablet (40 mg total) by mouth daily at 6 PM.     azithromycin 250 MG tablet  Commonly known as:  ZITHROMAX  Take 2 the first day and then one each day after.     colchicine 0.6 MG tablet  TAKE 1 TABLET EVERY DAY     HYDROcodone-acetaminophen 5-325 MG tablet  Commonly known as:  NORCO  Take 1 tablet by mouth every 6 (six) hours as needed for moderate pain.     metoprolol 50 MG tablet  Commonly known as:  LOPRESSOR  Take 1 tablet (50 mg total) by mouth 2 (two) times daily.     predniSONE 20 MG tablet  Commonly known as:  DELTASONE  2 po at same time daily for 5 days     tamsulosin 0.4 MG Caps capsule  Commonly known as:  FLOMAX  Take 2 capsules (0.8 mg total) by mouth daily after supper. For urine flow and prostate     triamterene-hydrochlorothiazide 37.5-25 MG tablet  Commonly known as:  MAXZIDE-25  TAKE 1 TABLET EVERY DAY     Vitamin D 2000  units tablet  Take 1 tablet (2,000 Units total) by mouth daily.           Objective:    BP 136/90 mmHg  Pulse 66  Temp(Src) 99.6 F (37.6 C) (Oral)  Ht 5\' 10"  (1.778 m)  Wt 177 lb 9.6 oz (80.559 kg)  BMI 25.48 kg/m2  Wt Readings from Last 3 Encounters:  05/08/15 177 lb 9.6 oz (80.559 kg)  03/07/15 179 lb 3.2 oz (81.285 kg)  02/25/15 178 lb 3.2 oz (80.831 kg)    Physical Exam  Constitutional: He is oriented to person, place, and time. He appears well-developed and well-nourished. No distress.  HENT:  Right Ear: Tympanic membrane, external ear and ear canal normal.  Left Ear: Tympanic membrane, external ear and ear canal normal.  Nose: Mucosal edema and rhinorrhea present. No sinus tenderness. No epistaxis. Right sinus exhibits maxillary sinus tenderness. Right sinus exhibits no frontal sinus tenderness. Left sinus exhibits maxillary sinus tenderness. Left sinus exhibits no frontal sinus tenderness.  Mouth/Throat: Uvula is midline and mucous membranes are normal. Posterior oropharyngeal edema and posterior oropharyngeal erythema present. No oropharyngeal exudate or tonsillar abscesses.  Eyes: Conjunctivae and EOM are normal. Pupils are equal, round, and reactive to light. Right eye exhibits no discharge. No scleral icterus.  Cardiovascular: Normal rate, regular rhythm, normal heart sounds and intact distal pulses.   No murmur heard. Pulmonary/Chest: Effort normal and breath sounds normal. No respiratory distress. He has no wheezes.  Musculoskeletal: Normal range of motion. He exhibits no edema.  Neurological: He is alert and oriented to person, place, and time. Coordination normal.  Skin: Skin is warm and dry. No rash noted. He is not diaphoretic.  Psychiatric: He has a normal mood and affect. His behavior is normal.  Vitals reviewed.       Assessment & Plan:   Problem List Items Addressed This Visit      Cardiovascular and Mediastinum   HTN (hypertension) - Primary    Relevant Medications   amLODipine (NORVASC) 10 MG tablet   metoprolol (LOPRESSOR) 50 MG tablet   atorvastatin (LIPITOR) 40 MG tablet     Other   HLD (hyperlipidemia)   Relevant Medications   amLODipine (NORVASC) 10 MG tablet   metoprolol (LOPRESSOR) 50 MG tablet   atorvastatin (LIPITOR) 40 MG tablet    Other Visit Diagnoses    Acute bronchitis, unspecified organism        Relevant Medications    predniSONE (DELTASONE) 20 MG tablet    azithromycin (ZITHROMAX) 250 MG tablet    albuterol (PROVENTIL HFA;VENTOLIN HFA) 108 (90 Base) MCG/ACT inhaler        Follow up plan: Return in about 4 weeks (around 06/05/2015), or if symptoms worsen or fail to improve, for recheck  COPD and htn.  Counseling provided for all of the vaccine components No orders of the defined types were placed in this encounter.    Caryl Pina, MD [facility] 05/08/2015, 11:37 AM

## 2015-05-21 ENCOUNTER — Other Ambulatory Visit: Payer: Self-pay | Admitting: Urology

## 2015-05-21 DIAGNOSIS — R972 Elevated prostate specific antigen [PSA]: Secondary | ICD-10-CM

## 2015-05-22 DIAGNOSIS — R300 Dysuria: Secondary | ICD-10-CM | POA: Diagnosis not present

## 2015-05-27 ENCOUNTER — Telehealth: Payer: Self-pay | Admitting: Family Medicine

## 2015-05-27 NOTE — Telephone Encounter (Signed)
Last filled 03/27/15, last seen 02/25/15. Rx will print

## 2015-05-27 NOTE — Telephone Encounter (Signed)
Pt. wil need to be seen.  30 min visit for initial pain consult.

## 2015-05-28 NOTE — Telephone Encounter (Signed)
Pt given appt with Dr.Stacks 1/20 at 10:55 for initial pain management.

## 2015-05-31 ENCOUNTER — Ambulatory Visit (INDEPENDENT_AMBULATORY_CARE_PROVIDER_SITE_OTHER): Payer: Commercial Managed Care - HMO | Admitting: Family Medicine

## 2015-05-31 ENCOUNTER — Encounter: Payer: Self-pay | Admitting: Family Medicine

## 2015-05-31 VITALS — BP 136/73 | HR 62 | Temp 97.9°F | Ht 70.0 in | Wt 177.0 lb

## 2015-05-31 DIAGNOSIS — M5416 Radiculopathy, lumbar region: Secondary | ICD-10-CM

## 2015-05-31 DIAGNOSIS — I10 Essential (primary) hypertension: Secondary | ICD-10-CM

## 2015-05-31 DIAGNOSIS — F1120 Opioid dependence, uncomplicated: Secondary | ICD-10-CM | POA: Diagnosis not present

## 2015-05-31 DIAGNOSIS — E785 Hyperlipidemia, unspecified: Secondary | ICD-10-CM | POA: Diagnosis not present

## 2015-05-31 DIAGNOSIS — M5417 Radiculopathy, lumbosacral region: Secondary | ICD-10-CM

## 2015-05-31 MED ORDER — HYDROCODONE-ACETAMINOPHEN 5-325 MG PO TABS: 1.0000 | ORAL_TABLET | Freq: Every day | ORAL | Status: DC | PRN

## 2015-05-31 NOTE — Progress Notes (Signed)
Subjective:  Patient ID: Gabriel Poole, male    DOB: [birth year]  Age: 70 y.o. MRN: [id number]  CC: pain management   HPI EVERTTE SONES presents for  [facility] Controlled Substance Abuse database reviewed- Yes  Depression screen [facility] 2/9 05/31/2015 05/08/2015 03/07/2015 02/25/2015 02/23/2015  Decreased Interest - 0 1 0 0  Down, Depressed, Hopeless 0 0 1 0 0  PHQ - 2 Score 0 0 2 0 0  Altered sleeping - - 1 - -  Tired, decreased energy - - 3 - -  Change in appetite - - 3 - -  Feeling bad or failure about yourself  - - 0 - -  Trouble concentrating - - 0 - -  Moving slowly or fidgety/restless - - 0 - -  Suicidal thoughts - - 0 - -  PHQ-9 Score - - 9 - -  Difficult doing work/chores - - Not difficult at all - -    GAD 7 : Generalized Anxiety Score 05/31/2015  Nervous, Anxious, on Edge 0  Control/stop worrying 0  Worry too much - different things 0  Trouble relaxing 0  Restless 0  Easily annoyed or irritable 0  Afraid - awful might happen 0  Total GAD 7 Score 0       [facility] drug screen performed- Yes  SOAPP  0= never  1= seldom  2=sometimes  3= often  4= very often  How often do you have mood swings? 0 How often do you smoke a cigarette within an hour after waling up? 2 How often have you taken medication other than the way that it was prescribed?0 How often have you used illegal drugs in the past 5 years? 0 How often, in your lifetime, have you had legal problems or been arrested? 3  Score 5  Alcohol Audit - How often during the last year have found that you: 0-Never   1- Less than monthly   2- Monthly     3-Weekly     4-daily or almost daily  - found that you were not able to stop drinking once you started- 0 -failed to do what was normally expected of you because of drinking- 0 -needed a first drink in the morning- 0 -had a feeling of guilt or remorse after drinking- 0 -are/were unable to remember what happened the night before because of your  drinking- 0  0- NO   2- yes but not in last year  4- yes during last year -Have you or someone else been injured because of your drinking- 0 - Has anyone been concerned about your drinking or suggested you cut down- 0        TOTAL- 0   ( 0-7- alcohol education, 8-15- simple advice, 16-19 simple advice plus counseling, 20-40 referral for evaluation and treatment 0   Designated Pharmacy- [facility]  Pain assessment: Cause of pain- feet  Pain location- feet Pain on scale of 1-10- 5 Frequency- qod Uses 1 hydrocodone every other day to two a day  What increases pain-eating beans & potatoes, XS Walking What makes pain Better-nothing Effects on ADL - none  Prior treatments tried and failed- ibuprofen, other NSAIDS Gabapentin didn't help Current treatments- hydrocodone qod - BID Morphine mg equivalent- 5  Pain management agreement reviewed and signed- Yes    History Mikey has a past medical history of Hypertension; Arthritis; Heart murmur; Gout; and Hyperlipidemia.   He has past surgical history that includes Finger surgery and Hydrocele surgery (  02/12/2011).   His family history includes Hypertension in his mother. There is no history of Hypotension, Anesthesia problems, Malignant hyperthermia, or Pseudochol deficiency.He reports that he has been smoking Cigarettes.  He has a 22.5 pack-year smoking history. He has never used smokeless tobacco. He reports that he does not drink alcohol or use illicit drugs.  Outpatient Prescriptions Prior to Visit  Medication Sig Dispense Refill  . albuterol (PROVENTIL HFA;VENTOLIN HFA) 108 (90 Base) MCG/ACT inhaler Inhale 2 puffs into the lungs every 4 (four) hours as needed for wheezing or shortness of breath. 1 Inhaler 1  . allopurinol (ZYLOPRIM) 100 MG tablet TAKE 1 TABLET EVERY DAY 90 tablet 0  . amLODipine (NORVASC) 10 MG tablet Take 1 tablet (10 mg total) by mouth daily. 90 tablet 1  . aspirin 81 MG tablet Take 81 mg by mouth daily.    Marland Kitchen  atorvastatin (LIPITOR) 40 MG tablet Take 1 tablet (40 mg total) by mouth daily at 6 PM. 90 tablet 0  . Cholecalciferol (VITAMIN D) 2000 UNITS tablet Take 1 tablet (2,000 Units total) by mouth daily. 30 tablet 11  . colchicine 0.6 MG tablet TAKE 1 TABLET EVERY DAY 90 tablet 0  . metoprolol (LOPRESSOR) 50 MG tablet Take 1 tablet (50 mg total) by mouth 2 (two) times daily. 180 tablet 3  . triamterene-hydrochlorothiazide (MAXZIDE-25) 37.5-25 MG tablet TAKE 1 TABLET EVERY DAY 90 tablet 1  . HYDROcodone-acetaminophen (NORCO) 5-325 MG tablet Take 1 tablet by mouth every 6 (six) hours as needed for moderate pain. 30 tablet 0  . tamsulosin (FLOMAX) 0.4 MG CAPS capsule Take 2 capsules (0.8 mg total) by mouth daily after supper. For urine flow and prostate 60 capsule 11  . azithromycin (ZITHROMAX) 250 MG tablet Take 2 the first day and then one each day after. 6 tablet 0  . predniSONE (DELTASONE) 20 MG tablet 2 po at same time daily for 5 days 10 tablet 0   No facility-administered medications prior to visit.    ROS Review of Systems  Constitutional: Negative for fever, chills, diaphoresis and unexpected weight change.  HENT: Negative for congestion, hearing loss, rhinorrhea and sore throat.   Eyes: Negative for visual disturbance.  Respiratory: Negative for cough and shortness of breath.   Cardiovascular: Negative for chest pain.  Gastrointestinal: Negative for abdominal pain, diarrhea and constipation.  Genitourinary: Negative for dysuria and flank pain.  Musculoskeletal: Negative for joint swelling and arthralgias.  Skin: Negative for rash.  Neurological: Negative for dizziness and headaches.  Psychiatric/Behavioral: Negative for sleep disturbance and dysphoric mood.    Objective:  BP 136/73 mmHg  Pulse 62  Temp(Src) 97.9 F (36.6 C) (Oral)  Ht '5\' 10"'  (1.778 m)  Wt 177 lb (80.287 kg)  BMI 25.40 kg/m2  SpO2 98%  BP Readings from Last 3 Encounters:  05/31/15 136/73  05/08/15 136/90    03/07/15 115/66    Wt Readings from Last 3 Encounters:  05/31/15 177 lb (80.287 kg)  05/08/15 177 lb 9.6 oz (80.559 kg)  03/07/15 179 lb 3.2 oz (81.285 kg)     Physical Exam  Constitutional: He is oriented to person, place, and time. He appears well-developed and well-nourished. No distress.  HENT:  Head: Normocephalic and atraumatic.  Right Ear: External ear normal.  Left Ear: External ear normal.  Nose: Nose normal.  Mouth/Throat: Oropharynx is clear and moist.  Eyes: Conjunctivae and EOM are normal. Pupils are equal, round, and reactive to light.  Neck: Normal range of motion. Neck  supple. No thyromegaly present.  Cardiovascular: Normal rate, regular rhythm and normal heart sounds.   No murmur heard. Pulmonary/Chest: Effort normal and breath sounds normal. No respiratory distress. He has no wheezes. He has no rales.  Abdominal: Soft. Bowel sounds are normal. He exhibits no distension. There is no tenderness.  Lymphadenopathy:    He has no cervical adenopathy.  Neurological: He is alert and oriented to person, place, and time. He has normal reflexes.  Skin: Skin is warm and dry.  Psychiatric: He has a normal mood and affect. His behavior is normal. Judgment and thought content normal.     Lab Results  Component Value Date   WBC 7.1 05/31/2015   HGB 13.1 08/11/2014   HCT 43.7 05/31/2015   PLT 297 05/31/2015   GLUCOSE 69 05/31/2015   CHOL 151 05/31/2015   TRIG 61 05/31/2015   HDL 57 05/31/2015   LDLCALC 82 05/31/2015   ALT 17 05/31/2015   AST 18 05/31/2015   NA 145* 05/31/2015   K 3.7 05/31/2015   CL 103 05/31/2015   CREATININE 0.98 05/31/2015   BUN 11 05/31/2015   CO2 26 05/31/2015   TSH 3.310 08/11/2014   PSA 8.1* 08/11/2014   HGBA1C 4.9% 07/24/2013    US Arterial Seg Single  12/10/2014  CLINICAL DATA:  70 year old male with a history of pain in the right calf. Cardiovascular risk factors include smoking, hypertension, hyperlipidemia. EXAM: NONINVASIVE  PHYSIOLOGIC VASCULAR STUDY OF BILATERAL LOWER EXTREMITIES TECHNIQUE: Evaluation of both lower extremities was performed at rest, including calculation of ankle-brachial indices, multiple segmental pressure evaluation, segmental Doppler and segmental pulse volume recording. COMPARISON:  None. FINDINGS: Right: Resting ankle brachial index:  1.13 Doppler: Segmental Doppler at the right ankle demonstrates abnormal waveform at the posterior tibial artery and dorsalis pedis, appearing biphasic. Left: Resting ankle brachial index: 1.13 Doppler: Segmental Doppler of the left lower extremity demonstrates abnormal waveform of the posterior tibial artery and dorsalis pedis, appearing biphasic. Additional: IMPRESSION: Resting ankle brachial index of the bilateral lower extremities demonstrates no evidence of arterial occlusive disease. Abnormal Doppler signal at the bilateral ankles, may reflect developing arterial disease above the level of the ankles. Signed, Dulcy Fanny. Earleen Newport, DO [facility] Electronically Signed   By: Corrie Mckusick D.O.   On: 12/10/2014 14:56    Assessment & Plan:   Trigg was seen today for pain management.  Diagnoses and all orders for this visit:  HLD (hyperlipidemia) -     CBC with Differential/Platelet -     CMP14+EGFR -     Lipid panel -     POCT urinalysis dipstick -     Vitamin D 1,25 dihydroxy -     ToxASSURE Select 13 (MW), Urine  Essential hypertension -     CBC with Differential/Platelet -     CMP14+EGFR -     Lipid panel -     POCT urinalysis dipstick -     Vitamin D 1,25 dihydroxy -     ToxASSURE Select 13 (MW), Urine  Lumbosacral radiculitis -     CBC with Differential/Platelet -     CMP14+EGFR -     Lipid panel -     POCT urinalysis dipstick -     Vitamin D 1,25 dihydroxy -     ToxASSURE Select 13 (MW), Urine  Uncomplicated opioid dependence (HCC) -     CBC with Differential/Platelet -      CMP14+EGFR -  Lipid panel -     POCT urinalysis dipstick -     Vitamin D 1,25 dihydroxy -     ToxASSURE Select 13 (MW), Urine  Other orders -     HYDROcodone-acetaminophen (NORCO) 5-325 MG tablet; Take 1 tablet by mouth daily as needed for moderate pain. -     HYDROcodone-acetaminophen (NORCO) 5-325 MG tablet; Take 1 tablet by mouth daily as needed for moderate pain. -     HYDROcodone-acetaminophen (NORCO) 5-325 MG tablet; Take 1 tablet by mouth daily as needed for moderate pain.   I have discontinued Mr. Proia tamsulosin, predniSONE, and azithromycin. I have also changed his HYDROcodone-acetaminophen. Additionally, I am having him start on HYDROcodone-acetaminophen and HYDROcodone-acetaminophen. Lastly, I am having him maintain his aspirin, Vitamin D, colchicine, triamterene-hydrochlorothiazide, allopurinol, amLODipine, metoprolol, atorvastatin, and albuterol.  Meds ordered this encounter  Medications  . HYDROcodone-acetaminophen (NORCO) 5-325 MG tablet    Sig: Take 1 tablet by mouth daily as needed for moderate pain.    Dispense:  30 tablet    Refill:  0    Do not fill untilMarch 21, 2017  . HYDROcodone-acetaminophen (NORCO) 5-325 MG tablet    Sig: Take 1 tablet by mouth daily as needed for moderate pain.    Dispense:  30 tablet    Refill:  0  . HYDROcodone-acetaminophen (NORCO) 5-325 MG tablet    Sig: Take 1 tablet by mouth daily as needed for moderate pain.    Dispense:  30 tablet    Refill:  0    Do not fill until Jun 30, 2015     Follow-up: Return in about 3 months (around 08/29/2015) for Pain.  Claretta Fraise, M.D.

## 2015-06-05 ENCOUNTER — Telehealth: Payer: Self-pay | Admitting: *Deleted

## 2015-06-05 MED ORDER — VARENICLINE TARTRATE 0.5 MG X 11 & 1 MG X 42 PO MISC: ORAL | Status: DC

## 2015-06-05 NOTE — Telephone Encounter (Signed)
Pt stated he was supposed to get RX for smoking cessation Please advise

## 2015-06-05 NOTE — Telephone Encounter (Signed)
RX for Chantix starter pack called into [facility] per pt request Okayed per Dr Livia Snellen

## 2015-06-05 NOTE — Telephone Encounter (Signed)
I wrote for Chantix starter pack. Please call in

## 2015-06-06 ENCOUNTER — Ambulatory Visit: Payer: Commercial Managed Care - HMO | Admitting: Family Medicine

## 2015-06-06 LAB — LIPID PANEL
CHOL/HDL RATIO: 2.6 ratio (ref 0.0–5.0)
Cholesterol, Total: 151 mg/dL (ref 100–199)
HDL: 57 mg/dL (ref >39)
LDL CALC: 82 mg/dL (ref 0–99)
Triglycerides: 61 mg/dL (ref 0–149)
VLDL CHOLESTEROL CAL: 12 mg/dL (ref 5–40)

## 2015-06-06 LAB — CBC WITH DIFFERENTIAL/PLATELET
BASOS ABS: 0 10*3/uL (ref 0.0–0.2)
Basos: 1 %
EOS (ABSOLUTE): 0.2 10*3/uL (ref 0.0–0.4)
EOS: 3 %
HEMATOCRIT: 43.7 % (ref 37.5–51.0)
HEMOGLOBIN: 14.1 g/dL (ref 12.6–17.7)
IMMATURE GRANS (ABS): 0 10*3/uL (ref 0.0–0.1)
Immature Granulocytes: 0 %
LYMPHS ABS: 2.7 10*3/uL (ref 0.7–3.1)
LYMPHS: 38 %
MCH: 27.9 pg (ref 26.6–33.0)
MCHC: 32.3 g/dL (ref 31.5–35.7)
MCV: 87 fL (ref 79–97)
MONOCYTES: 8 %
Monocytes Absolute: 0.5 10*3/uL (ref 0.1–0.9)
NEUTROS ABS: 3.6 10*3/uL (ref 1.4–7.0)
Neutrophils: 50 %
Platelets: 297 10*3/uL (ref 150–379)
RBC: 5.05 x10E6/uL (ref 4.14–5.80)
RDW: 14.4 % (ref 12.3–15.4)
WBC: 7.1 10*3/uL (ref 3.4–10.8)

## 2015-06-06 LAB — CMP14+EGFR
ALBUMIN: 4.6 g/dL (ref 3.6–4.8)
ALK PHOS: 112 IU/L (ref 39–117)
ALT: 17 IU/L (ref 0–44)
AST: 18 IU/L (ref 0–40)
Albumin/Globulin Ratio: 1.9 (ref 1.1–2.5)
BUN / CREAT RATIO: 11 (ref 10–22)
BUN: 11 mg/dL (ref 8–27)
Bilirubin Total: 0.5 mg/dL (ref 0.0–1.2)
CO2: 26 mmol/L (ref 18–29)
CREATININE: 0.98 mg/dL (ref 0.76–1.27)
Calcium: 9.8 mg/dL (ref 8.6–10.2)
Chloride: 103 mmol/L (ref 96–106)
GFR, EST AFRICAN AMERICAN: 91 mL/min/{1.73_m2} (ref >59)
GFR, EST NON AFRICAN AMERICAN: 78 mL/min/{1.73_m2} (ref >59)
GLOBULIN, TOTAL: 2.4 g/dL (ref 1.5–4.5)
GLUCOSE: 69 mg/dL (ref 65–99)
Potassium: 3.7 mmol/L (ref 3.5–5.2)
SODIUM: 145 mmol/L — AB (ref 134–144)
TOTAL PROTEIN: 7 g/dL (ref 6.0–8.5)

## 2015-06-06 LAB — TOXASSURE SELECT 13 (MW), URINE: PDF: 0

## 2015-06-06 LAB — VITAMIN D 1,25 DIHYDROXY
VITAMIN D 1, 25 (OH) TOTAL: 57 pg/mL
VITAMIN D3 1, 25 (OH): 54 pg/mL

## 2015-06-07 ENCOUNTER — Other Ambulatory Visit (HOSPITAL_COMMUNITY): Payer: Medicare HMO

## 2015-06-07 ENCOUNTER — Encounter: Payer: Self-pay | Admitting: Family Medicine

## 2015-06-07 ENCOUNTER — Telehealth: Payer: Self-pay | Admitting: *Deleted

## 2015-06-07 NOTE — Telephone Encounter (Signed)
TC to pt re: no show of appt on 1/26. Received no answer. Also noted pt cancelled Ultrasound schedule for today 1/27

## 2015-06-10 ENCOUNTER — Telehealth: Payer: Self-pay | Admitting: Family Medicine

## 2015-06-10 NOTE — Telephone Encounter (Signed)
Pt notified of results Verbalizes understanding 

## 2015-06-10 NOTE — Telephone Encounter (Signed)
Please contact the patient to tell him his Vitamin D was excellent at 57. The urine drug screen was negative - normal.

## 2015-07-15 DIAGNOSIS — N3281 Overactive bladder: Secondary | ICD-10-CM | POA: Diagnosis not present

## 2015-08-21 ENCOUNTER — Encounter: Payer: Self-pay | Admitting: Family Medicine

## 2015-08-21 ENCOUNTER — Encounter (INDEPENDENT_AMBULATORY_CARE_PROVIDER_SITE_OTHER): Payer: Self-pay

## 2015-08-21 ENCOUNTER — Ambulatory Visit (INDEPENDENT_AMBULATORY_CARE_PROVIDER_SITE_OTHER): Payer: Commercial Managed Care - HMO | Admitting: Family Medicine

## 2015-08-21 VITALS — BP 170/91 | HR 86 | Temp 98.0°F | Ht 70.0 in | Wt 174.0 lb

## 2015-08-21 DIAGNOSIS — M5386 Other specified dorsopathies, lumbar region: Secondary | ICD-10-CM | POA: Insufficient documentation

## 2015-08-21 DIAGNOSIS — Z9119 Patient's noncompliance with other medical treatment and regimen: Secondary | ICD-10-CM

## 2015-08-21 DIAGNOSIS — M79661 Pain in right lower leg: Secondary | ICD-10-CM | POA: Diagnosis not present

## 2015-08-21 DIAGNOSIS — M5431 Sciatica, right side: Secondary | ICD-10-CM | POA: Diagnosis not present

## 2015-08-21 DIAGNOSIS — Z91199 Patient's noncompliance with other medical treatment and regimen due to unspecified reason: Secondary | ICD-10-CM

## 2015-08-21 MED ORDER — INDOMETHACIN 25 MG PO CAPS: 25.0000 mg | ORAL_CAPSULE | Freq: Two times a day (BID) | ORAL | Status: DC

## 2015-08-21 MED ORDER — BETAMETHASONE SOD PHOS & ACET 6 (3-3) MG/ML IJ SUSP
6.0000 mg | Freq: Once | INTRAMUSCULAR | Status: AC
  Administered 2015-08-21: 6 mg via INTRAMUSCULAR

## 2015-08-21 MED ORDER — CYCLOBENZAPRINE HCL 10 MG PO TABS
10.0000 mg | ORAL_TABLET | Freq: Three times a day (TID) | ORAL | Status: DC | PRN
Start: 2015-08-21 — End: 2017-02-01

## 2015-08-21 NOTE — Patient Instructions (Signed)
I can not refill any opiate/narcotic-containing medication for you before April 20 because of your 3 month prescription for hydrocodone given in January and the pain agreement we reviewed together.

## 2015-08-21 NOTE — Progress Notes (Signed)
Subjective:  Patient ID: Gabriel Poole, male    DOB: 14-May-1945  Age: 70 y.o. MRN: [id number]  CC: Back Pain   HPI Gabriel Poole presents for LBP radiating down R leg, started 5 days ago. Gabriel Poole has a past medical history of Hypertension; Arthritis; Heart murmur; Gout; and Hyperlipidemia.   He has past surgical history that includes Finger surgery and Hydrocele surgery (02/12/2011).   His family history includes Hypertension in his mother. There is no history of Hypotension, Anesthesia problems, Malignant hyperthermia, or Pseudochol deficiency.He reports that he has been smoking Cigarettes.  He has a 22.5 pack-year smoking history. He has never used smokeless tobacco. He reports that he does not drink alcohol or use illicit drugs.    ROS Review of Systems  Constitutional: Negative for fever, chills and diaphoresis.  HENT: Negative for sore throat.   Cardiovascular: Negative for chest pain.  Gastrointestinal: Negative for abdominal pain.  Musculoskeletal: Positive for myalgias, back pain, arthralgias and gait problem. Negative for neck pain.  Skin: Negative for rash.  Neurological: Positive for weakness. Negative for numbness.    Objective:  BP 170/91 mmHg  Pulse 86  Temp(Src) 98 F (36.7 C) (Oral)  Ht 5\' 10"  (1.778 m)  Wt 174 lb (78.926 kg)  BMI 24.97 kg/m2  SpO2 97%  BP Readings from Last 3 Encounters:  08/21/15 170/91  05/31/15 136/73  05/08/15 136/90    Wt Readings from Last 3 Encounters:  08/21/15 174 lb (78.926 kg)  05/31/15 177 lb (80.287 kg)  05/08/15 177 lb 9.6 oz (80.559 kg)     Physical Exam  Constitutional: He is oriented to person, place, and time. He appears well-developed and well-nourished. He appears distressed.  HENT:  Head: Normocephalic.  Eyes: Pupils are equal, round, and reactive to light.  Neck: Normal range of motion.  Cardiovascular: Normal rate, regular rhythm and normal heart sounds.   No murmur  heard. Pulmonary/Chest: Effort normal and breath sounds normal.  Abdominal: There is no tenderness.  Musculoskeletal: He exhibits tenderness.  Neurological: He is alert and oriented to person, place, and time. He has normal reflexes.  Skin: Skin is warm and dry.  Psychiatric: His behavior is normal. Thought content normal.  Vitals reviewed.    Lab Results  Component Value Date   WBC 7.1 05/31/2015   HGB 13.1 08/11/2014   HCT 43.7 05/31/2015   PLT 297 05/31/2015   GLUCOSE 69 05/31/2015   CHOL 151 05/31/2015   TRIG 61 05/31/2015   HDL 57 05/31/2015   LDLCALC 82 05/31/2015   ALT 17 05/31/2015   AST 18 05/31/2015   NA 145* 05/31/2015   K 3.7 05/31/2015   CL 103 05/31/2015   CREATININE 0.98 05/31/2015   BUN 11 05/31/2015   CO2 26 05/31/2015   TSH 3.310 08/11/2014   PSA 8.1* 08/11/2014   HGBA1C 4.9% 07/24/2013    US Arterial Seg Single  12/10/2014  CLINICAL DATA:  70 year old male with a history of pain in the right calf. Cardiovascular risk factors include smoking, hypertension, hyperlipidemia. EXAM: NONINVASIVE PHYSIOLOGIC VASCULAR STUDY OF BILATERAL LOWER EXTREMITIES TECHNIQUE: Evaluation of both lower extremities was performed at rest, including calculation of ankle-brachial indices, multiple segmental pressure evaluation, segmental Doppler and segmental pulse volume recording. COMPARISON:  None. FINDINGS: Right: Resting ankle brachial index:  1.13 Doppler: Segmental Doppler at the right ankle demonstrates abnormal waveform at the posterior tibial artery and dorsalis pedis, appearing biphasic. Left: Resting ankle brachial index: 1.13  Doppler: Segmental Doppler of the left lower extremity demonstrates abnormal waveform of the posterior tibial artery and dorsalis pedis, appearing biphasic. Additional: IMPRESSION: Resting ankle brachial index of the bilateral lower extremities demonstrates no evidence of arterial occlusive disease. Abnormal Doppler signal at the bilateral ankles, may  reflect developing arterial disease above the level of the ankles. Signed, Dulcy Fanny. Earleen Newport, DO [facility] Electronically Signed   By: Corrie Mckusick D.O.   On: 12/10/2014 14:56    Assessment & Plan:   Gabriel Poole was seen today for back pain.  Diagnoses and all orders for this visit:  Sciatica associated with disorder of lumbar spine, right -     betamethasone acetate-betamethasone sodium phosphate (CELESTONE) injection 6 mg; Inject 1 mL (6 mg total) into the muscle once.  Noncompliance  Pain of right lower leg  Other orders -     Discontinue: indomethacin (INDOCIN) 25 MG capsule; Take 1 capsule (25 mg total) by mouth 2 (two) times daily with a meal. -     cyclobenzaprine (FLEXERIL) 10 MG tablet; Take 1 tablet (10 mg total) by mouth 3 (three) times daily as needed for muscle spasms. -     Discontinue: indomethacin (INDOCIN) 25 MG capsule; Take 1 capsule (25 mg total) by mouth 2 (two) times daily with a meal. -     indomethacin (INDOCIN) 25 MG capsule; Take 1 capsule (25 mg total) by mouth 2 (two) times daily with a meal.      I am having Gabriel Poole start on cyclobenzaprine. I am also having him maintain his aspirin, Vitamin D, colchicine, triamterene-hydrochlorothiazide, allopurinol, amLODipine, metoprolol, atorvastatin, albuterol, HYDROcodone-acetaminophen, HYDROcodone-acetaminophen, HYDROcodone-acetaminophen, varenicline, and indomethacin. We administered betamethasone acetate-betamethasone sodium phosphate.  Meds ordered this encounter  Medications  . DISCONTD: indomethacin (INDOCIN) 25 MG capsule    Sig: Take 1 capsule (25 mg total) by mouth 2 (two) times daily with a meal.    Dispense:  30 capsule    Refill:  2  . betamethasone acetate-betamethasone sodium phosphate (CELESTONE) injection 6 mg    Sig:   . cyclobenzaprine (FLEXERIL) 10 MG tablet    Sig: Take 1 tablet (10 mg total) by mouth 3 (three) times daily as needed  for muscle spasms.    Dispense:  90 tablet    Refill:  1  . DISCONTD: indomethacin (INDOCIN) 25 MG capsule    Sig: Take 1 capsule (25 mg total) by mouth 2 (two) times daily with a meal.    Dispense:  30 capsule    Refill:  2  . indomethacin (INDOCIN) 25 MG capsule    Sig: Take 1 capsule (25 mg total) by mouth 2 (two) times daily with a meal.    Dispense:  30 capsule    Refill:  2     Follow-up: Return in about 8 days (around 08/29/2015) for Pain.  Claretta Fraise, M.D.

## 2015-08-29 ENCOUNTER — Telehealth: Payer: Self-pay | Admitting: Family Medicine

## 2015-08-29 ENCOUNTER — Ambulatory Visit: Payer: Commercial Managed Care - HMO | Admitting: Family Medicine

## 2015-08-29 NOTE — Telephone Encounter (Signed)
In order to consider this patient request, the patient will need to be seen 

## 2015-08-31 NOTE — Telephone Encounter (Signed)
Patient has appointment on 4/25

## 2015-09-02 ENCOUNTER — Encounter: Payer: Self-pay | Admitting: Family Medicine

## 2015-09-03 ENCOUNTER — Ambulatory Visit: Payer: Commercial Managed Care - HMO | Admitting: Family Medicine

## 2015-09-04 ENCOUNTER — Ambulatory Visit (INDEPENDENT_AMBULATORY_CARE_PROVIDER_SITE_OTHER): Payer: Commercial Managed Care - HMO | Admitting: Family Medicine

## 2015-09-04 ENCOUNTER — Encounter: Payer: Self-pay | Admitting: Family Medicine

## 2015-09-04 VITALS — BP 158/88 | HR 62 | Temp 98.4°F | Ht 70.0 in | Wt 176.0 lb

## 2015-09-04 DIAGNOSIS — F1120 Opioid dependence, uncomplicated: Secondary | ICD-10-CM

## 2015-09-04 DIAGNOSIS — M5416 Radiculopathy, lumbar region: Secondary | ICD-10-CM

## 2015-09-04 DIAGNOSIS — I10 Essential (primary) hypertension: Secondary | ICD-10-CM

## 2015-09-04 DIAGNOSIS — M5417 Radiculopathy, lumbosacral region: Secondary | ICD-10-CM

## 2015-09-04 MED ORDER — HYDROCODONE-ACETAMINOPHEN 5-325 MG PO TABS: 1.0000 | ORAL_TABLET | Freq: Every day | ORAL | Status: DC | PRN

## 2015-09-04 NOTE — Progress Notes (Signed)
Subjective:  Patient ID: Gabriel Poole, male    DOB: Nov 04, 1945  Age: 70 y.o. MRN: [id number]  CC: Pain   HPI Gabriel Poole presents for Pain assessment: Pain location- lower back Pain on scale of 1-10- 6 Frequency- What increases pain-bending, ambulation if extended What makes pain Better-laying down Effects on ADL - difficult to perform light chores Any change in general medical condition- none  Current medications- see list Effectiveness of current meds-adequate Adverse reactions form pain meds-denied  Pill count performed-No Urine drug screen- No    History Gabriel Poole has a past medical history of Hypertension; Arthritis; Heart murmur; Gout; and Hyperlipidemia.   Gabriel Poole has past surgical history that includes Finger surgery and Hydrocele surgery (02/12/2011).   His family history includes Hypertension in his mother. There is no history of Hypotension, Anesthesia problems, Malignant hyperthermia, or Pseudochol deficiency.Gabriel Poole reports that Gabriel Poole has been smoking Cigarettes.  Gabriel Poole has a 22.5 pack-year smoking history. Gabriel Poole has never used smokeless tobacco. Gabriel Poole reports that Gabriel Poole does not drink alcohol or use illicit drugs.    ROS Review of Systems  Constitutional: Negative for fever, chills and diaphoresis.  HENT: Negative for rhinorrhea and sore throat.   Respiratory: Negative for cough and shortness of breath.   Cardiovascular: Negative for chest pain.  Gastrointestinal: Negative for abdominal pain.  Musculoskeletal: Positive for myalgias, back pain and arthralgias.  Skin: Negative for rash.  Neurological: Negative for weakness and headaches.    Objective:  BP 158/88 mmHg  Pulse 62  Temp(Src) 98.4 F (36.9 C) (Oral)  Ht 5\' 10"  (1.778 m)  Wt 176 lb (79.833 kg)  BMI 25.25 kg/m2  SpO2 98%  BP Readings from Last 3 Encounters:  09/04/15 158/88  08/21/15 170/91  05/31/15 136/73    Wt Readings from Last 3 Encounters:  09/04/15 176 lb (79.833 kg)  08/21/15 174 lb  (78.926 kg)  05/31/15 177 lb (80.287 kg)     Physical Exam  Constitutional: Gabriel Poole appears well-developed and well-nourished.  HENT:  Head: Normocephalic and atraumatic.  Right Ear: Tympanic membrane and external ear normal. No decreased hearing is noted.  Left Ear: Tympanic membrane and external ear normal. No decreased hearing is noted.  Mouth/Throat: No oropharyngeal exudate or posterior oropharyngeal erythema.  Eyes: Pupils are equal, round, and reactive to light.  Neck: Normal range of motion. Neck supple.  Cardiovascular: Normal rate and regular rhythm.   No murmur heard. Pulmonary/Chest: Breath sounds normal. No respiratory distress.  Abdominal: Soft. Bowel sounds are normal. Gabriel Poole exhibits no mass. There is no tenderness.  Vitals reviewed.    Lab Results  Component Value Date   WBC 7.1 05/31/2015   HGB 13.1 08/11/2014   HCT 43.7 05/31/2015   PLT 297 05/31/2015   GLUCOSE 69 05/31/2015   CHOL 151 05/31/2015   TRIG 61 05/31/2015   HDL 57 05/31/2015   LDLCALC 82 05/31/2015   ALT 17 05/31/2015   AST 18 05/31/2015   NA 145* 05/31/2015   K 3.7 05/31/2015   CL 103 05/31/2015   CREATININE 0.98 05/31/2015   BUN 11 05/31/2015   CO2 26 05/31/2015   TSH 3.310 08/11/2014   PSA 8.1* 08/11/2014   HGBA1C 4.9% 07/24/2013    US Arterial Seg Single  12/10/2014  CLINICAL DATA:  70 year old male with a history of pain in the right calf. Cardiovascular risk factors include smoking, hypertension, hyperlipidemia. EXAM: NONINVASIVE PHYSIOLOGIC VASCULAR STUDY OF BILATERAL LOWER EXTREMITIES TECHNIQUE: Evaluation of both lower extremities was performed at  rest, including calculation of ankle-brachial indices, multiple segmental pressure evaluation, segmental Doppler and segmental pulse volume recording. COMPARISON:  None. FINDINGS: Right: Resting ankle brachial index:  1.13 Doppler: Segmental Doppler at the right ankle demonstrates abnormal waveform at the posterior tibial artery and dorsalis  pedis, appearing biphasic. Left: Resting ankle brachial index: 1.13 Doppler: Segmental Doppler of the left lower extremity demonstrates abnormal waveform of the posterior tibial artery and dorsalis pedis, appearing biphasic. Additional: IMPRESSION: Resting ankle brachial index of the bilateral lower extremities demonstrates no evidence of arterial occlusive disease. Abnormal Doppler signal at the bilateral ankles, may reflect developing arterial disease above the level of the ankles. Signed, Dulcy Fanny. Earleen Newport, DO [facility] Electronically Signed   By: Corrie Mckusick D.O.   On: 12/10/2014 14:56    Assessment & Plan:   Gabriel Poole was seen today for pain.  Diagnoses and all orders for this visit:  Lumbosacral radiculitis  Essential hypertension, benign  Uncomplicated opioid dependence ([place])  Other orders -     HYDROcodone-acetaminophen (NORCO) 5-325 MG tablet; Take 1 tablet by mouth daily as needed for moderate pain. -     HYDROcodone-acetaminophen (NORCO) 5-325 MG tablet; Take 1 tablet by mouth daily as needed for moderate pain. -     HYDROcodone-acetaminophen (NORCO) 5-325 MG tablet; Take 1 tablet by mouth daily as needed for moderate pain.      I have discontinued Gabriel Poole varenicline. I am also having him maintain his aspirin, Vitamin D, colchicine, triamterene-hydrochlorothiazide, allopurinol, amLODipine, metoprolol, atorvastatin, albuterol, cyclobenzaprine, indomethacin, HYDROcodone-acetaminophen, HYDROcodone-acetaminophen, and HYDROcodone-acetaminophen.  Meds ordered this encounter  Medications  . HYDROcodone-acetaminophen (NORCO) 5-325 MG tablet    Sig: Take 1 tablet by mouth daily as needed for moderate pain.    Dispense:  30 tablet    Refill:  0    Do not fill untilMay 26, 2017  . HYDROcodone-acetaminophen (NORCO) 5-325 MG tablet    Sig: Take 1 tablet by mouth daily as needed for moderate pain.    Dispense:  30  tablet    Refill:  0  . HYDROcodone-acetaminophen (NORCO) 5-325 MG tablet    Sig: Take 1 tablet by mouth daily as needed for moderate pain.    Dispense:  30 tablet    Refill:  0    Do not fill until November 03, 2015     Follow-up: Return pt. dismissed. Scrip refills given.  Encouraged to find new practice ASAP Claretta Fraise, M.D.

## 2015-11-13 ENCOUNTER — Other Ambulatory Visit: Payer: Self-pay | Admitting: Family Medicine

## 2015-11-26 ENCOUNTER — Other Ambulatory Visit: Payer: Self-pay | Admitting: Family Medicine

## 2015-12-04 ENCOUNTER — Other Ambulatory Visit: Payer: Self-pay | Admitting: Family Medicine

## 2015-12-04 DIAGNOSIS — I10 Essential (primary) hypertension: Secondary | ICD-10-CM

## 2015-12-04 MED ORDER — METOPROLOL TARTRATE 50 MG PO TABS: 50.0000 mg | ORAL_TABLET | Freq: Two times a day (BID) | ORAL | 0 refills | Status: DC

## 2015-12-04 MED ORDER — TRIAMTERENE-HCTZ 37.5-25 MG PO TABS: 1.0000 | ORAL_TABLET | Freq: Every day | ORAL | 0 refills | Status: DC

## 2015-12-04 MED ORDER — AMLODIPINE BESYLATE 10 MG PO TABS: 10.0000 mg | ORAL_TABLET | Freq: Every day | ORAL | 0 refills | Status: DC

## 2015-12-04 NOTE — Telephone Encounter (Signed)
Refilled BP meds before I realized that patient was dismissed from the practice. Called [facility] to cancel rxs and attempted to call patient to inform him without success

## 2015-12-16 ENCOUNTER — Other Ambulatory Visit: Payer: Self-pay | Admitting: Family Medicine

## 2015-12-16 DIAGNOSIS — J449 Chronic obstructive pulmonary disease, unspecified: Secondary | ICD-10-CM | POA: Diagnosis not present

## 2015-12-16 DIAGNOSIS — M1A9XX0 Chronic gout, unspecified, without tophus (tophi): Secondary | ICD-10-CM | POA: Diagnosis not present

## 2015-12-16 DIAGNOSIS — M79605 Pain in left leg: Secondary | ICD-10-CM | POA: Diagnosis not present

## 2015-12-16 DIAGNOSIS — E782 Mixed hyperlipidemia: Secondary | ICD-10-CM | POA: Diagnosis not present

## 2015-12-16 DIAGNOSIS — E559 Vitamin D deficiency, unspecified: Secondary | ICD-10-CM | POA: Diagnosis not present

## 2015-12-16 DIAGNOSIS — I10 Essential (primary) hypertension: Secondary | ICD-10-CM | POA: Diagnosis not present

## 2015-12-16 DIAGNOSIS — Z6824 Body mass index (BMI) 24.0-24.9, adult: Secondary | ICD-10-CM | POA: Diagnosis not present

## 2015-12-16 DIAGNOSIS — R634 Abnormal weight loss: Secondary | ICD-10-CM | POA: Diagnosis not present

## 2015-12-17 DIAGNOSIS — R634 Abnormal weight loss: Secondary | ICD-10-CM | POA: Diagnosis not present

## 2015-12-17 DIAGNOSIS — I10 Essential (primary) hypertension: Secondary | ICD-10-CM | POA: Diagnosis not present

## 2015-12-17 DIAGNOSIS — E782 Mixed hyperlipidemia: Secondary | ICD-10-CM | POA: Diagnosis not present

## 2015-12-17 DIAGNOSIS — M1A9XX0 Chronic gout, unspecified, without tophus (tophi): Secondary | ICD-10-CM | POA: Diagnosis not present

## 2015-12-17 DIAGNOSIS — E559 Vitamin D deficiency, unspecified: Secondary | ICD-10-CM | POA: Diagnosis not present

## 2015-12-17 DIAGNOSIS — J449 Chronic obstructive pulmonary disease, unspecified: Secondary | ICD-10-CM | POA: Diagnosis not present

## 2016-01-14 DIAGNOSIS — I10 Essential (primary) hypertension: Secondary | ICD-10-CM | POA: Diagnosis not present

## 2016-01-14 DIAGNOSIS — M169 Osteoarthritis of hip, unspecified: Secondary | ICD-10-CM | POA: Diagnosis not present

## 2016-01-14 DIAGNOSIS — E559 Vitamin D deficiency, unspecified: Secondary | ICD-10-CM | POA: Diagnosis not present

## 2016-01-14 DIAGNOSIS — E782 Mixed hyperlipidemia: Secondary | ICD-10-CM | POA: Diagnosis not present

## 2016-01-14 DIAGNOSIS — Z6824 Body mass index (BMI) 24.0-24.9, adult: Secondary | ICD-10-CM | POA: Diagnosis not present

## 2016-01-14 DIAGNOSIS — M109 Gout, unspecified: Secondary | ICD-10-CM | POA: Diagnosis not present

## 2016-04-23 DIAGNOSIS — M169 Osteoarthritis of hip, unspecified: Secondary | ICD-10-CM | POA: Diagnosis not present

## 2016-04-23 DIAGNOSIS — E782 Mixed hyperlipidemia: Secondary | ICD-10-CM | POA: Diagnosis not present

## 2016-04-23 DIAGNOSIS — I10 Essential (primary) hypertension: Secondary | ICD-10-CM | POA: Diagnosis not present

## 2016-04-23 DIAGNOSIS — M109 Gout, unspecified: Secondary | ICD-10-CM | POA: Diagnosis not present

## 2016-04-23 DIAGNOSIS — F17200 Nicotine dependence, unspecified, uncomplicated: Secondary | ICD-10-CM | POA: Diagnosis not present

## 2016-04-23 DIAGNOSIS — E559 Vitamin D deficiency, unspecified: Secondary | ICD-10-CM | POA: Diagnosis not present

## 2016-06-24 DIAGNOSIS — R634 Abnormal weight loss: Secondary | ICD-10-CM | POA: Diagnosis not present

## 2016-06-24 DIAGNOSIS — M1A0790 Idiopathic chronic gout, unspecified ankle and foot, without tophus (tophi): Secondary | ICD-10-CM | POA: Diagnosis not present

## 2016-06-24 DIAGNOSIS — Z6831 Body mass index (BMI) 31.0-31.9, adult: Secondary | ICD-10-CM | POA: Diagnosis not present

## 2016-06-24 DIAGNOSIS — F17210 Nicotine dependence, cigarettes, uncomplicated: Secondary | ICD-10-CM | POA: Diagnosis not present

## 2016-06-24 DIAGNOSIS — I10 Essential (primary) hypertension: Secondary | ICD-10-CM | POA: Diagnosis not present

## 2016-06-24 DIAGNOSIS — E559 Vitamin D deficiency, unspecified: Secondary | ICD-10-CM | POA: Diagnosis not present

## 2016-08-25 DIAGNOSIS — R1030 Lower abdominal pain, unspecified: Secondary | ICD-10-CM | POA: Diagnosis not present

## 2016-08-25 DIAGNOSIS — G8929 Other chronic pain: Secondary | ICD-10-CM | POA: Diagnosis not present

## 2016-08-25 DIAGNOSIS — F17210 Nicotine dependence, cigarettes, uncomplicated: Secondary | ICD-10-CM | POA: Diagnosis not present

## 2016-08-25 DIAGNOSIS — R319 Hematuria, unspecified: Secondary | ICD-10-CM | POA: Diagnosis not present

## 2016-08-25 DIAGNOSIS — Z6829 Body mass index (BMI) 29.0-29.9, adult: Secondary | ICD-10-CM | POA: Diagnosis not present

## 2016-08-25 DIAGNOSIS — R310 Gross hematuria: Secondary | ICD-10-CM | POA: Diagnosis not present

## 2016-09-22 ENCOUNTER — Ambulatory Visit: Payer: Medicare HMO | Admitting: Urology

## 2016-10-27 DIAGNOSIS — R1010 Upper abdominal pain, unspecified: Secondary | ICD-10-CM | POA: Diagnosis not present

## 2016-10-27 DIAGNOSIS — N281 Cyst of kidney, acquired: Secondary | ICD-10-CM | POA: Diagnosis not present

## 2016-10-27 DIAGNOSIS — N821 Other female urinary-genital tract fistulae: Secondary | ICD-10-CM | POA: Diagnosis not present

## 2016-10-27 DIAGNOSIS — R1030 Lower abdominal pain, unspecified: Secondary | ICD-10-CM | POA: Diagnosis not present

## 2016-12-28 ENCOUNTER — Ambulatory Visit (INDEPENDENT_AMBULATORY_CARE_PROVIDER_SITE_OTHER): Payer: Medicare HMO | Admitting: Internal Medicine

## 2016-12-28 ENCOUNTER — Encounter (INDEPENDENT_AMBULATORY_CARE_PROVIDER_SITE_OTHER): Payer: Self-pay | Admitting: Internal Medicine

## 2017-02-01 ENCOUNTER — Encounter (INDEPENDENT_AMBULATORY_CARE_PROVIDER_SITE_OTHER): Payer: Self-pay | Admitting: *Deleted

## 2017-02-01 ENCOUNTER — Other Ambulatory Visit (INDEPENDENT_AMBULATORY_CARE_PROVIDER_SITE_OTHER): Payer: Self-pay | Admitting: Internal Medicine

## 2017-02-01 ENCOUNTER — Ambulatory Visit (INDEPENDENT_AMBULATORY_CARE_PROVIDER_SITE_OTHER): Payer: Medicare HMO | Admitting: Internal Medicine

## 2017-02-01 ENCOUNTER — Encounter (INDEPENDENT_AMBULATORY_CARE_PROVIDER_SITE_OTHER): Payer: Self-pay | Admitting: Internal Medicine

## 2017-02-01 VITALS — BP 144/80 | HR 60 | Temp 97.7°F | Ht 70.0 in | Wt 152.5 lb

## 2017-02-01 DIAGNOSIS — R1013 Epigastric pain: Secondary | ICD-10-CM

## 2017-02-01 DIAGNOSIS — K219 Gastro-esophageal reflux disease without esophagitis: Secondary | ICD-10-CM | POA: Diagnosis not present

## 2017-02-01 MED ORDER — PANTOPRAZOLE SODIUM 40 MG PO TBEC: 40.0000 mg | DELAYED_RELEASE_TABLET | Freq: Two times a day (BID) | ORAL | 4 refills | Status: DC

## 2017-02-01 NOTE — Progress Notes (Addendum)
   Subjective:    Patient ID: Gabriel Poole, male    DOB: 19-Aug-1945, 71 y.o.   MRN: [id number]  HPI Referred by Dr. Melina Copa for epigastric pain. He tells me at night he has pain in his epigastric region. He says he has a lot of gas. He says he can take the Carafate or drinks milk and the pain is relieved.  He says if he eats, he does not have pain. He says he does not have an appetite.   He has lost 30 pounds since last year. He has a BM about every other day.  Drinks 1/2 pint on Friday's and Saturday. He drinks a little brandy everyday.  Quit smoking last week. (Smoked a pack every day and a half).   He works at [employer].  [place] for abdominal pain in June was norma.    01/12/2014 Colonoscopy: screening.  5 mm polyp cecum. Polyp was sessile.  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  Colon otherwise normal.     '  Review of Systems    Past Medical History:  Diagnosis Date  . Arthritis   . Gout   . Heart murmur   . Hyperlipidemia   . Hypertension     Past Surgical History:  Procedure Laterality Date  . FINGER SURGERY     Lt middle   . HYDROCELE EXCISION  02/12/2011   Procedure: HYDROCELECTOMY ADULT;  Surgeon: Marissa Nestle;  Location: [facility];  Service: Urology;  Laterality: Left;    Allergies  Allergen Reactions  . Ace Inhibitors   . Losartan     itching    Current Outpatient Prescriptions on File Prior to Visit  Medication Sig Dispense Refill  . allopurinol (ZYLOPRIM) 100 MG tablet TAKE 1 TABLET EVERY DAY 90 tablet 0  . amLODipine (NORVASC) 10 MG tablet Take 1 tablet (10 mg total) by mouth daily. 90 tablet 0  . aspirin 81 MG tablet Take 81 mg by mouth daily.    Marland Kitchen atorvastatin (LIPITOR) 40 MG tablet Take 1 tablet (40 mg total) by mouth daily at 6 PM. 90 tablet 0  . Cholecalciferol (VITAMIN D) 2000 UNITS tablet Take 1 tablet (2,000 Units total) by mouth daily. 30 tablet 11  . colchicine 0.6 MG tablet TAKE 1 TABLET EVERY DAY (Patient taking  differently: as needed) 90 tablet 0  . albuterol (PROVENTIL HFA;VENTOLIN HFA) 108 (90 Base) MCG/ACT inhaler Inhale 2 puffs into the lungs every 4 (four) hours as needed for wheezing or shortness of breath. (Patient not taking: Reported on 02/01/2017) 1 Inhaler 1   No current facility-administered medications on file prior to visit.        Objective:   Physical Exam Blood pressure (!) 144/80, pulse 60, temperature 97.7 F (36.5 C), height 5\' 10"  (1.778 m), weight 152 lb 8 oz (69.2 kg). Alert and oriented. Skin warm and dry. Oral mucosa is moist.   . Sclera anicteric, conjunctivae is pink. Thyroid not enlarged. No cervical lymphadenopathy. Lungs clear. Heart regular rate and rhythm.  Abdomen is soft. Bowel sounds are positive. No hepatomegaly. No abdominal masses felt. No tenderness.  No edema to lower extremities.          Assessment & Plan:  GERD. Epigastric pain. PUD needs to be ruled out.  Rx for Protonix 40mg  BID. Continue the Carafate.  Will get US abdomen report from [facility].

## 2017-02-01 NOTE — Patient Instructions (Signed)
US abdomen,. EGD. The risks of bleeding, perforation and infection were reviewed with patient.

## 2017-02-17 ENCOUNTER — Ambulatory Visit (HOSPITAL_COMMUNITY)
Admission: RE | Admit: 2017-02-17 | Discharge: 2017-02-17 | Disposition: A | Discharge status: Home / Self Care | Payer: Medicare HMO | Source: Ambulatory Visit | Attending: Internal Medicine | Admitting: Internal Medicine

## 2017-02-17 ENCOUNTER — Encounter (HOSPITAL_COMMUNITY)
Admission: RE | Disposition: A | Discharge status: Home / Self Care | Payer: Self-pay | Source: Ambulatory Visit | Attending: Internal Medicine

## 2017-02-17 ENCOUNTER — Encounter (HOSPITAL_COMMUNITY): Payer: Self-pay | Admitting: *Deleted

## 2017-02-17 DIAGNOSIS — C169 Malignant neoplasm of stomach, unspecified: Secondary | ICD-10-CM | POA: Insufficient documentation

## 2017-02-17 DIAGNOSIS — R1013 Epigastric pain: Secondary | ICD-10-CM | POA: Insufficient documentation

## 2017-02-17 DIAGNOSIS — Z880 Allergy status to penicillin: Secondary | ICD-10-CM | POA: Insufficient documentation

## 2017-02-17 DIAGNOSIS — R634 Abnormal weight loss: Secondary | ICD-10-CM | POA: Insufficient documentation

## 2017-02-17 DIAGNOSIS — K228 Other specified diseases of esophagus: Secondary | ICD-10-CM | POA: Insufficient documentation

## 2017-02-17 DIAGNOSIS — F17210 Nicotine dependence, cigarettes, uncomplicated: Secondary | ICD-10-CM | POA: Diagnosis not present

## 2017-02-17 DIAGNOSIS — Z79899 Other long term (current) drug therapy: Secondary | ICD-10-CM | POA: Diagnosis not present

## 2017-02-17 DIAGNOSIS — I10 Essential (primary) hypertension: Secondary | ICD-10-CM | POA: Diagnosis not present

## 2017-02-17 DIAGNOSIS — E785 Hyperlipidemia, unspecified: Secondary | ICD-10-CM | POA: Diagnosis not present

## 2017-02-17 DIAGNOSIS — K3189 Other diseases of stomach and duodenum: Secondary | ICD-10-CM | POA: Diagnosis not present

## 2017-02-17 DIAGNOSIS — M1990 Unspecified osteoarthritis, unspecified site: Secondary | ICD-10-CM | POA: Insufficient documentation

## 2017-02-17 DIAGNOSIS — M109 Gout, unspecified: Secondary | ICD-10-CM | POA: Diagnosis not present

## 2017-02-17 DIAGNOSIS — R1033 Periumbilical pain: Secondary | ICD-10-CM | POA: Diagnosis not present

## 2017-02-17 DIAGNOSIS — Z888 Allergy status to other drugs, medicaments and biological substances status: Secondary | ICD-10-CM | POA: Diagnosis not present

## 2017-02-17 DIAGNOSIS — Z7982 Long term (current) use of aspirin: Secondary | ICD-10-CM | POA: Insufficient documentation

## 2017-02-17 DIAGNOSIS — K254 Chronic or unspecified gastric ulcer with hemorrhage: Secondary | ICD-10-CM | POA: Insufficient documentation

## 2017-02-17 DIAGNOSIS — K922 Gastrointestinal hemorrhage, unspecified: Secondary | ICD-10-CM | POA: Diagnosis not present

## 2017-02-17 HISTORY — PX: ESOPHAGOGASTRODUODENOSCOPY: SHX5428

## 2017-02-17 SURGERY — EGD (ESOPHAGOGASTRODUODENOSCOPY)
Anesthesia: Moderate Sedation

## 2017-02-17 MED ORDER — MEPERIDINE HCL 50 MG/ML IJ SOLN
INTRAMUSCULAR | Status: AC
  Filled 2017-02-17: qty 1

## 2017-02-17 MED ORDER — SODIUM CHLORIDE 0.9 % IV SOLN
INTRAVENOUS | Status: DC
  Administered 2017-02-17: 1000 mL via INTRAVENOUS

## 2017-02-17 MED ORDER — LIDOCAINE VISCOUS 2 % MT SOLN
OROMUCOSAL | Status: AC
  Filled 2017-02-17: qty 15

## 2017-02-17 MED ORDER — LIDOCAINE HCL 2 % EX GEL
CUTANEOUS | Status: DC | PRN
  Administered 2017-02-17: 1

## 2017-02-17 MED ORDER — MIDAZOLAM HCL 5 MG/5ML IJ SOLN
INTRAMUSCULAR | Status: DC | PRN
  Administered 2017-02-17: 1 mg via INTRAVENOUS
  Administered 2017-02-17: 2 mg via INTRAVENOUS

## 2017-02-17 MED ORDER — MEPERIDINE HCL 50 MG/ML IJ SOLN
INTRAMUSCULAR | Status: DC | PRN
  Administered 2017-02-17: 25 mg

## 2017-02-17 MED ORDER — MIDAZOLAM HCL 5 MG/5ML IJ SOLN
INTRAMUSCULAR | Status: AC
  Filled 2017-02-17: qty 10

## 2017-02-17 NOTE — Op Note (Signed)
[facility] Patient Name: Gabriel Poole Procedure Date: 02/17/2017 2:06 PM MRN: [id number] Date of Birth: 1946-04-25 Attending MD: Hildred Laser , MD CSN: [id number] Age: 71 Admit Type: Outpatient Procedure:                Upper GI endoscopy Indications:              Epigastric abdominal pain, Periumbilical abdominal                            pain, Weight loss Providers:                Hildred Laser, MD, Janeece Riggers, RN, Rosina Lowenstein, RN Referring MD:             Octavio Graves, DO Medicines:                Lidocaine spray, Meperidine 25 mg IV, Midazolam 3                            mg IV Complications:            No immediate complications. Estimated Blood Loss:     Estimated blood loss was minimal. Procedure:                Pre-Anesthesia Assessment:                           - Prior to the procedure, a History and Physical                            was performed, and patient medications and                            allergies were reviewed. The patient's tolerance of                            previous anesthesia was also reviewed. The risks                            and benefits of the procedure and the sedation                            options and risks were discussed with the patient.                            All questions were answered, and informed consent                            was obtained. Prior Anticoagulants: The patient                            last took aspirin 2 days prior to the procedure.                            ASA Grade Assessment: II - A patient with mild  systemic disease. After reviewing the risks and                            benefits, the patient was deemed in satisfactory                            condition to undergo the procedure.                           After obtaining informed consent, the endoscope was                            passed under direct vision. Throughout the                             procedure, the patient's blood pressure, pulse, and                            oxygen saturations were monitored continuously. The                            [id number] ([id number]) scope was introduced through the                            mouth, and advanced to the second part of duodenum.                            The upper GI endoscopy was accomplished without                            difficulty. The patient tolerated the procedure                            well. Scope In: 2:42:28 PM Scope Out: 2:52:26 PM Total Procedure Duration: 0 hours 9 minutes 58 seconds  Findings:      The examined esophagus was normal.      The Z-line was irregular and was found 44 cm from the incisors.      Mucosa coated with red blood in the gastric body and in the gastric       antrum traced to gastric ulcer      One cratered gastric ulcer was found at the incisura and in the gastric       antrum. The lesion was twenty mm by twenty-five mm in largest dimension.       Biopsies were taken with a cold forceps for histology.      Patchy erythematous mucosa was found in the gastric antrum.      The cardia, gastric fundus, gastric body and pylorus were normal.      The duodenal bulb and second portion of the duodenum were normal. Impression:               - Normal esophagus.                           - Z-line irregular, 44 cm from the incisors.                           -  Red blood in the gastric body and in the gastric                            antrum.                           - Large gastric ulcer. Biopsied.                           - Erythematous mucosa in the antrum.                           - Normal cardia, gastric fundus, gastric body and                            pylorus.                           - Normal duodenal bulb and second portion of the                            duodenum.                           Comment: Endoscopic appearence concerning for                            malignant  ulcer. Moderate Sedation:      Moderate (conscious) sedation was administered by the endoscopy nurse       and supervised by the endoscopist. The following parameters were       monitored: oxygen saturation, heart rate, blood pressure, CO2       capnography and response to care. Total physician intraservice time was       14 minutes. Recommendation:           - Patient has a contact number available for                            emergencies. The signs and symptoms of potential                            delayed complications were discussed with the                            patient. Return to normal activities tomorrow.                            Written discharge instructions were provided to the                            patient.                           - Resume previous diet today.                           - Continue present medications.                           -  Await pathology results.                           - Discontinue aspirin. No OTC NSAIDs.                           - Continue pantoprazole but discontinue ranitidine. Procedure Code(s):        --- Professional ---                           [phone], Esophagogastroduodenoscopy, flexible,                            transoral; with biopsy, single or multiple                           99152, Moderate sedation services provided by the                            same physician or other qualified health care                            professional performing the diagnostic or                            therapeutic service that the sedation supports,                            requiring the presence of an independent trained                            observer to assist in the monitoring of the                            patient's level of consciousness and physiological                            status; initial 15 minutes of intraservice time,                            patient age 103 years or older Diagnosis Code(s):         --- Professional ---                           K22.8, Other specified diseases of esophagus                           K92.2, Gastrointestinal hemorrhage, unspecified                           K25.4, Chronic or unspecified gastric ulcer with                            hemorrhage  K31.89, Other diseases of stomach and duodenum                           R10.13, Epigastric pain                           V49.44, Periumbilical pain                           R63.4, Abnormal weight loss CPT copyright 2016 American Medical Association. All rights reserved. The codes documented in this report are preliminary and upon coder review may  be revised to meet current compliance requirements. Hildred Laser, MD Hildred Laser, MD 02/17/2017 3:19:24 PM This report has been signed electronically. Number of Addenda: 0

## 2017-02-17 NOTE — Discharge Instructions (Signed)
No aspirin or Advil Aleve or similar medications. Discontinue Zantac. Resume other medications as before. Resume usual diet. No driving for 24 hours.        Esophagogastroduodenoscopy, Care After Refer to this sheet in the next few weeks. These instructions provide you with information about caring for yourself after your procedure. Your health care provider may also give you more specific instructions. Your treatment has been planned according to current medical practices, but problems sometimes occur. Call your health care provider if you have any problems or questions after your procedure. What can I expect after the procedure? After the procedure, it is common to have:  A sore throat.  Nausea.  Bloating.  Dizziness.  Fatigue.  Follow these instructions at home:  Do not eat or drink anything until the numbing medicine (local anesthetic) has worn off and your gag reflex has returned. You will know that the local anesthetic has worn off when you can swallow comfortably.  Do not drive for 24 hours if you received a medicine to help you relax (sedative).  If your health care provider took a tissue sample for testing during the procedure, make sure to get your test results. This is your responsibility. Ask your health care provider or the department performing the test when your results will be ready.  Keep all follow-up visits as told by your health care provider. This is important. Contact a health care provider if:  You cannot stop coughing.  You are not urinating.  You are urinating less than usual. Get help right away if:  You have trouble swallowing.  You cannot eat or drink.  You have throat or chest pain that gets worse.  You are dizzy or light-headed.  You faint.  You have nausea or vomiting.  You have chills.  You have a fever.  You have severe abdominal pain.  You have black, tarry, or bloody stools. This information is not intended to replace  advice given to you by your health care provider. Make sure you discuss any questions you have with your health care provider. Document Released: 04/13/2012 Document Revised: 10/03/2015 Document Reviewed: 03/21/2015 Elsevier Interactive Patient Education  [employer].

## 2017-02-17 NOTE — H&P (Signed)
Gabriel Poole is an 71 y.o. male.   Chief Complaint: Patient is here for EGD. HPI: patient is 71 year old African-Americ 4 month history of pigastric and midabdominal pain and bloating. Pain usually occurs at. He has lost 40 pounds in one year. He states he is eating bette He has nauseated when he works at [employer]. He denies vomiting melena or rectal bleeding.he had upper abdominal ultrasound in June this year and was negative for cholelithiasis. He states he has had peptic ulcer   Past Medical History:  Diagnosis Date  . Arthritis   . Gout   . Heart murmur   . Hyperlipidemia   . Hypertension     Past Surgical History:  Procedure Laterality Date  . COLONOSCOPY    . FINGER SURGERY     Lt middle   . HYDROCELE EXCISION  02/12/2011   Procedure: HYDROCELECTOMY ADULT;  Surgeon: Marissa Nestle;  Location: [facility];  Service: Urology;  Laterality: Left;    Family History  Problem Relation Age of Onset  . Hypertension Mother   . Heart attack Father   . Hypotension Neg Hx   . Anesthesia problems Neg Hx   . Malignant hyperthermia Neg Hx   . Pseudochol deficiency Neg Hx    Social History:  reports that he has been smoking Cigarettes and Cigars.  He has a 22.50 pack-year smoking history. He has never used smokeless tobacco. He reports that he drinks alcohol. He reports that he does not use drugs.  Allergies:  Allergies  Allergen Reactions  . Penicillins Itching  . Ace Inhibitors   . Losartan     itching    Medications Prior to Admission  Medication Sig Dispense Refill  . amLODipine (NORVASC) 10 MG tablet Take 1 tablet (10 mg total) by mouth daily. 90 tablet 0  . aspirin 81 MG tablet Take 81 mg by mouth daily.    . Cholecalciferol (VITAMIN D3 SUPER STRENGTH) 2000 units CAPS Take 2,000 Units by mouth daily.    . metoprolol tartrate (LOPRESSOR) 50 MG tablet Take 50 mg by mouth 2 (two) times daily.    . pantoprazole (PROTONIX) 40 MG tablet Take 1 tablet (40 mg total) by mouth 2  (two) times daily before a meal. 60 tablet 4  . sucralfate (CARAFATE) 1 g tablet Take 1 g by mouth 2 (two) times daily.    Marland Kitchen albuterol (PROVENTIL HFA;VENTOLIN HFA) 108 (90 Base) MCG/ACT inhaler Inhale 2 puffs into the lungs every 4 (four) hours as needed for wheezing or shortness of breath. (Patient not taking: Reported on 02/01/2017) 1 Inhaler 1  . allopurinol (ZYLOPRIM) 100 MG tablet TAKE 1 TABLET EVERY DAY (Patient not taking: Reported on 02/15/2017) 90 tablet 0  . atorvastatin (LIPITOR) 40 MG tablet Take 1 tablet (40 mg total) by mouth daily at 6 PM. 90 tablet 0  . Cholecalciferol (VITAMIN D) 2000 UNITS tablet Take 1 tablet (2,000 Units total) by mouth daily. (Patient not taking: Reported on 02/15/2017) 30 tablet 11  . colchicine 0.6 MG tablet TAKE 1 TABLET EVERY DAY (Patient not taking: Reported on 02/15/2017) 90 tablet 0  . ranitidine (ZANTAC) 300 MG tablet Take 300 mg by mouth daily as needed for heartburn (gas).       No results found for this or any previous visit (from the past 48 hour(s)). No results found.  ROS  Blood pressure (!) 158/82, pulse 62, temperature 97.9 F (36.6 C), temperature source Oral, resp. rate 13, height 5\' 10"  (1.778 m),  weight 155 lb (70.3 kg), SpO2 100 %. Physical Exam  Constitutional: He appears well-developed and well-nourished.  HENT:  Mouth/Throat: Oropharynx is clear and moist.  Eyes: Conjunctivae are normal. No scleral icterus.  Neck: No thyromegaly present.  Cardiovascular: Normal rate, regular rhythm and normal heart sounds.   No murmur heard. Respiratory: Effort normal and breath sounds normal.  GI: Soft. He exhibits no distension and no mass. There is no tenderness.  Musculoskeletal: He exhibits no edema.  Lymphadenopathy:    He has no cervical adenopathy.  Neurological: He is alert.  Skin: Skin is warm and dry.     Assessment/Plan Epigastric and midabdominal pain and weight loss. Diagnostic EGD.  Hildred Laser, MD 02/17/2017, 2:32  PM

## 2017-02-20 ENCOUNTER — Other Ambulatory Visit (INDEPENDENT_AMBULATORY_CARE_PROVIDER_SITE_OTHER): Payer: Self-pay | Admitting: Internal Medicine

## 2017-02-20 DIAGNOSIS — C169 Malignant neoplasm of stomach, unspecified: Secondary | ICD-10-CM

## 2017-02-22 ENCOUNTER — Encounter (HOSPITAL_COMMUNITY): Payer: Self-pay | Admitting: Internal Medicine

## 2017-02-23 ENCOUNTER — Other Ambulatory Visit (HOSPITAL_COMMUNITY): Payer: Medicare HMO

## 2017-02-25 ENCOUNTER — Ambulatory Visit (HOSPITAL_COMMUNITY)
Admission: RE | Admit: 2017-02-25 | Discharge: 2017-02-25 | Disposition: A | Discharge status: Home / Self Care | Payer: Medicare HMO | Source: Ambulatory Visit | Attending: Internal Medicine | Admitting: Internal Medicine

## 2017-02-25 DIAGNOSIS — R109 Unspecified abdominal pain: Secondary | ICD-10-CM | POA: Insufficient documentation

## 2017-02-25 DIAGNOSIS — N281 Cyst of kidney, acquired: Secondary | ICD-10-CM | POA: Insufficient documentation

## 2017-02-25 DIAGNOSIS — C169 Malignant neoplasm of stomach, unspecified: Secondary | ICD-10-CM | POA: Insufficient documentation

## 2017-02-25 DIAGNOSIS — N400 Enlarged prostate without lower urinary tract symptoms: Secondary | ICD-10-CM | POA: Diagnosis not present

## 2017-02-25 LAB — POCT I-STAT CREATININE: Creatinine, Ser: 1 mg/dL (ref 0.61–1.24)

## 2017-02-25 MED ORDER — IOPAMIDOL (ISOVUE-300) INJECTION 61%
100.0000 mL | Freq: Once | INTRAVENOUS | Status: AC | PRN
  Administered 2017-02-25: 100 mL via INTRAVENOUS

## 2017-03-02 ENCOUNTER — Telehealth (INDEPENDENT_AMBULATORY_CARE_PROVIDER_SITE_OTHER): Payer: Self-pay | Admitting: Internal Medicine

## 2017-03-02 NOTE — Telephone Encounter (Signed)
Patient called, lmoam at 10:15am that he was leaving for work at 10:30am and would like for Dr. Laural Golden to call him, for results I believe.  [phone]

## 2017-03-04 ENCOUNTER — Ambulatory Visit (HOSPITAL_COMMUNITY): Payer: Medicare HMO

## 2017-03-05 ENCOUNTER — Other Ambulatory Visit: Payer: Self-pay

## 2017-03-05 ENCOUNTER — Encounter (HOSPITAL_COMMUNITY): Payer: Self-pay | Admitting: *Deleted

## 2017-03-05 ENCOUNTER — Telehealth: Payer: Self-pay

## 2017-03-05 DIAGNOSIS — K3189 Other diseases of stomach and duodenum: Secondary | ICD-10-CM

## 2017-03-05 NOTE — Telephone Encounter (Signed)
EUS scheduled at [facility] for 03/11/17.  I spoke with [name] the patient's care partner and explained procedure and instructions.  Instructions mailed to her at her request to [address], [place].  She will communicate with the patient

## 2017-03-05 NOTE — Telephone Encounter (Signed)
Attempts were made. Dr.Rehman had also talked with the patient. On 03/04/2017, patient presented to our office and Dr.Rehman talked with patient.

## 2017-03-05 NOTE — Telephone Encounter (Signed)
-----   Message from Milus Banister, MD sent at 03/05/2017  9:50 AM EDT ----- Regarding: RE: EUS  Evolet Salminen, He needs upper EUS radial +/- linear, next week (thursday Nov 1st) at [facility] with MAC sedation, staging for gastric cancer. Thanks  Shelly Rubenstein get this scheduled. DJ   ----- Message ----- From: Marlon Pel, RN Sent: 03/05/2017   9:40 AM To: Milus Banister, MD Subject: FW: EUS                                        Please advise if ok to set up ----- Message ----- From: Worthy Keeler Sent: 03/05/2017   7:36 AM To: Jeoffrey Massed, RN Subject: EUS                                            Delorise Jackson, Dr Laural Golden spoke to Dr Ardis Hughs about this patient, he needs EUS for gastric adenocarcinoma staging. I think Dr Laural Golden and Dr Ardis Hughs decided to do it next week. Contact person Rhyse Loux @ [phone]. All info is in [employer]

## 2017-03-11 ENCOUNTER — Encounter (HOSPITAL_COMMUNITY)
Admission: RE | Disposition: A | Discharge status: Home / Self Care | Payer: Self-pay | Source: Ambulatory Visit | Attending: Gastroenterology

## 2017-03-11 ENCOUNTER — Ambulatory Visit (HOSPITAL_COMMUNITY): Payer: Medicare HMO | Admitting: Anesthesiology

## 2017-03-11 ENCOUNTER — Ambulatory Visit (HOSPITAL_COMMUNITY)
Admission: RE | Admit: 2017-03-11 | Discharge: 2017-03-11 | Disposition: A | Discharge status: Home / Self Care | Payer: Medicare HMO | Source: Ambulatory Visit | Attending: Gastroenterology | Admitting: Gastroenterology

## 2017-03-11 ENCOUNTER — Encounter (HOSPITAL_COMMUNITY): Payer: Self-pay

## 2017-03-11 DIAGNOSIS — I10 Essential (primary) hypertension: Secondary | ICD-10-CM | POA: Insufficient documentation

## 2017-03-11 DIAGNOSIS — F17210 Nicotine dependence, cigarettes, uncomplicated: Secondary | ICD-10-CM | POA: Diagnosis not present

## 2017-03-11 DIAGNOSIS — K3189 Other diseases of stomach and duodenum: Secondary | ICD-10-CM

## 2017-03-11 DIAGNOSIS — Z880 Allergy status to penicillin: Secondary | ICD-10-CM | POA: Insufficient documentation

## 2017-03-11 DIAGNOSIS — M109 Gout, unspecified: Secondary | ICD-10-CM | POA: Diagnosis not present

## 2017-03-11 DIAGNOSIS — E785 Hyperlipidemia, unspecified: Secondary | ICD-10-CM | POA: Insufficient documentation

## 2017-03-11 DIAGNOSIS — Z7982 Long term (current) use of aspirin: Secondary | ICD-10-CM | POA: Diagnosis not present

## 2017-03-11 DIAGNOSIS — C162 Malignant neoplasm of body of stomach: Secondary | ICD-10-CM

## 2017-03-11 DIAGNOSIS — R1013 Epigastric pain: Secondary | ICD-10-CM | POA: Diagnosis not present

## 2017-03-11 DIAGNOSIS — Z79899 Other long term (current) drug therapy: Secondary | ICD-10-CM | POA: Diagnosis not present

## 2017-03-11 HISTORY — DX: Malignant (primary) neoplasm, unspecified: C80.1

## 2017-03-11 HISTORY — PX: EUS: SHX5427

## 2017-03-11 SURGERY — UPPER ENDOSCOPIC ULTRASOUND (EUS) LINEAR
Anesthesia: Monitor Anesthesia Care

## 2017-03-11 MED ORDER — LIDOCAINE 2% (20 MG/ML) 5 ML SYRINGE
INTRAMUSCULAR | Status: AC
  Filled 2017-03-11: qty 5

## 2017-03-11 MED ORDER — PROPOFOL 500 MG/50ML IV EMUL
INTRAVENOUS | Status: DC | PRN
  Administered 2017-03-11: 150 ug/kg/min via INTRAVENOUS

## 2017-03-11 MED ORDER — LIDOCAINE 2% (20 MG/ML) 5 ML SYRINGE
INTRAMUSCULAR | Status: DC | PRN
  Administered 2017-03-11: 60 mg via INTRAVENOUS

## 2017-03-11 MED ORDER — PROPOFOL 10 MG/ML IV BOLUS
INTRAVENOUS | Status: DC | PRN
  Administered 2017-03-11 (×2): 20 mg via INTRAVENOUS

## 2017-03-11 MED ORDER — LACTATED RINGERS IV SOLN
INTRAVENOUS | Status: DC
Start: 2017-03-11 — End: 2017-03-11
  Administered 2017-03-11: 10:00:00 via INTRAVENOUS

## 2017-03-11 MED ORDER — SODIUM CHLORIDE 0.9 % IV SOLN: INTRAVENOUS | Status: DC

## 2017-03-11 NOTE — H&P (View-Only) (Signed)
Gabriel Poole is an 71 y.o. male.   Chief Complaint: Patient is here for EGD. HPI: patient is 71 year old African-[place] 4 month history of pigastric and midabdominal pain and bloating. Pain usually occurs at. He has lost 40 pounds in one year. He states he is eating bette He has nauseated when he works at [employer]. He denies vomiting melena or rectal bleeding.he had upper abdominal ultrasound in June this year and was negative for cholelithiasis. He states he has had peptic ulcer   Past Medical History:  Diagnosis Date  . Arthritis   . Gout   . Heart murmur   . Hyperlipidemia   . Hypertension     Past Surgical History:  Procedure Laterality Date  . COLONOSCOPY    . FINGER SURGERY     Lt middle   . HYDROCELE EXCISION  02/12/2011   Procedure: HYDROCELECTOMY ADULT;  Surgeon: Marissa Nestle;  Location: [facility];  Service: Urology;  Laterality: Left;    Family History  Problem Relation Age of Onset  . Hypertension Mother   . Heart attack Father   . Hypotension Neg Hx   . Anesthesia problems Neg Hx   . Malignant hyperthermia Neg Hx   . Pseudochol deficiency Neg Hx    Social History:  reports that he has been smoking Cigarettes and Cigars.  He has a 22.50 pack-year smoking history. He has never used smokeless tobacco. He reports that he drinks alcohol. He reports that he does not use drugs.  Allergies:  Allergies  Allergen Reactions  . Penicillins Itching  . Ace Inhibitors   . Losartan     itching    Medications Prior to Admission  Medication Sig Dispense Refill  . amLODipine (NORVASC) 10 MG tablet Take 1 tablet (10 mg total) by mouth daily. 90 tablet 0  . aspirin 81 MG tablet Take 81 mg by mouth daily.    . Cholecalciferol (VITAMIN D3 SUPER STRENGTH) 2000 units CAPS Take 2,000 Units by mouth daily.    . metoprolol tartrate (LOPRESSOR) 50 MG tablet Take 50 mg by mouth 2 (two) times daily.    . pantoprazole (PROTONIX) 40 MG tablet Take 1 tablet (40 mg total) by mouth 2  (two) times daily before a meal. 60 tablet 4  . sucralfate (CARAFATE) 1 g tablet Take 1 g by mouth 2 (two) times daily.    Marland Kitchen albuterol (PROVENTIL HFA;VENTOLIN HFA) 108 (90 Base) MCG/ACT inhaler Inhale 2 puffs into the lungs every 4 (four) hours as needed for wheezing or shortness of breath. (Patient not taking: Reported on 02/01/2017) 1 Inhaler 1  . allopurinol (ZYLOPRIM) 100 MG tablet TAKE 1 TABLET EVERY DAY (Patient not taking: Reported on 02/15/2017) 90 tablet 0  . atorvastatin (LIPITOR) 40 MG tablet Take 1 tablet (40 mg total) by mouth daily at 6 PM. 90 tablet 0  . Cholecalciferol (VITAMIN D) 2000 UNITS tablet Take 1 tablet (2,000 Units total) by mouth daily. (Patient not taking: Reported on 02/15/2017) 30 tablet 11  . colchicine 0.6 MG tablet TAKE 1 TABLET EVERY DAY (Patient not taking: Reported on 02/15/2017) 90 tablet 0  . ranitidine (ZANTAC) 300 MG tablet Take 300 mg by mouth daily as needed for heartburn (gas).       No results found for this or any previous visit (from the past 48 hour(s)). No results found.  ROS  Blood pressure (!) 158/82, pulse 62, temperature 97.9 F (36.6 C), temperature source Oral, resp. rate 13, height 5\' 10"  (1.778 m),  weight 155 lb (70.3 kg), SpO2 100 %. Physical Exam  Constitutional: He appears well-developed and well-nourished.  HENT:  Mouth/Throat: Oropharynx is clear and moist.  Eyes: Conjunctivae are normal. No scleral icterus.  Neck: No thyromegaly present.  Cardiovascular: Normal rate, regular rhythm and normal heart sounds.   No murmur heard. Respiratory: Effort normal and breath sounds normal.  GI: Soft. He exhibits no distension and no mass. There is no tenderness.  Musculoskeletal: He exhibits no edema.  Lymphadenopathy:    He has no cervical adenopathy.  Neurological: He is alert.  Skin: Skin is warm and dry.     Assessment/Plan Epigastric and midabdominal pain and weight loss. Diagnostic EGD.  Hildred Laser, MD 02/17/2017, 2:32  PM

## 2017-03-11 NOTE — Anesthesia Preprocedure Evaluation (Signed)
Anesthesia Evaluation  Patient identified by MRN, date of birth, ID band Patient awake    Reviewed: Allergy & Precautions, H&P , Patient's Chart, lab work & pertinent test results, reviewed documented beta blocker date and time   Airway Mallampati: II  TM Distance: >3 FB Neck ROM: full    Dental no notable dental hx.    Pulmonary Current Smoker,    Pulmonary exam normal breath sounds clear to auscultation       Cardiovascular hypertension, On Medications  Rhythm:regular Rate:Normal     Neuro/Psych    GI/Hepatic   Endo/Other    Renal/GU      Musculoskeletal   Abdominal   Peds  Hematology   Anesthesia Other Findings   Reproductive/Obstetrics                             Anesthesia Physical Anesthesia Plan  ASA: II  Anesthesia Plan: MAC   Post-op Pain Management:    Induction: Intravenous  PONV Risk Score and Plan: 1 and Ondansetron, Dexamethasone and Treatment may vary due to age or medical condition  Airway Management Planned: Mask and Natural Airway  Additional Equipment:   Intra-op Plan:   Post-operative Plan:   Informed Consent: I have reviewed the patients History and Physical, chart, labs and discussed the procedure including the risks, benefits and alternatives for the proposed anesthesia with the patient or authorized representative who has indicated his/her understanding and acceptance.   Dental Advisory Given  Plan Discussed with: CRNA and Surgeon  Anesthesia Plan Comments:         Anesthesia Quick Evaluation

## 2017-03-11 NOTE — Anesthesia Procedure Notes (Signed)
Procedure Name: MAC Date/Time: 03/11/2017 10:51 AM Performed by: Dione Booze Pre-anesthesia Checklist: Patient identified, Emergency Drugs available, Suction available and Patient being monitored Patient Re-evaluated:Patient Re-evaluated prior to induction Oxygen Delivery Method: Nasal cannula Placement Confirmation: positive ETCO2

## 2017-03-11 NOTE — Anesthesia Postprocedure Evaluation (Signed)
Anesthesia Post Note  Patient: DEMOND SHALLENBERGER  Procedure(s) Performed: UPPER ENDOSCOPIC ULTRASOUND (EUS) LINEAR (N/A ) UPPER ENDOSCOPIC ULTRASOUND (EUS) RADIAL (N/A )     Patient location during evaluation: PACU Anesthesia Type: MAC Level of consciousness: awake and alert Pain management: pain level controlled Vital Signs Assessment: post-procedure vital signs reviewed and stable Respiratory status: spontaneous breathing, nonlabored ventilation, respiratory function stable and patient connected to nasal cannula oxygen Cardiovascular status: stable and blood pressure returned to baseline Postop Assessment: no apparent nausea or vomiting Anesthetic complications: no    Last Vitals:  Vitals:   03/11/17 1130 03/11/17 1140  BP:  (!) 175/85  Pulse:  (!) 59  Resp:  20  Temp:    SpO2: 99% 99%    Last Pain:  Vitals:   03/11/17 1115  TempSrc: Oral                 Unknown Flannigan EDWARD

## 2017-03-11 NOTE — Discharge Instructions (Signed)

## 2017-03-11 NOTE — Transfer of Care (Signed)
Immediate Anesthesia Transfer of Care Note  Patient: Gabriel Poole  Procedure(s) Performed: UPPER ENDOSCOPIC ULTRASOUND (EUS) LINEAR (N/A ) UPPER ENDOSCOPIC ULTRASOUND (EUS) RADIAL (N/A )  Patient Location: PACU and Endoscopy Unit  Anesthesia Type:MAC  Level of Consciousness: sedated and patient cooperative  Airway & Oxygen Therapy: Patient Spontanous Breathing and Patient connected to nasal cannula oxygen  Post-op Assessment: Report given to RN and Post -op Vital signs reviewed and stable  Post vital signs: Reviewed and stable  Last Vitals:  Vitals:   03/11/17 1013  BP: (!) 166/75  Pulse: 60  Resp: 19  Temp: 36.4 C  SpO2: 99%    Last Pain:  Vitals:   03/11/17 1013  TempSrc: Oral         Complications: No apparent anesthesia complications

## 2017-03-11 NOTE — Interval H&P Note (Signed)
History and Physical Interval Note:  03/11/2017 10:13 AM  Gabriel Poole  has presented today for surgery, with the diagnosis of gastric cancer  The various methods of treatment have been discussed with the patient and family. After consideration of risks, benefits and other options for treatment, the patient has consented to  Procedure(s): UPPER ENDOSCOPIC ULTRASOUND (EUS) LINEAR (N/A) UPPER ENDOSCOPIC ULTRASOUND (EUS) RADIAL (N/A) as a surgical intervention .  The patient's history has been reviewed, patient examined, no change in status, stable for surgery.  I have reviewed the patient's chart and labs.  Questions were answered to the patient's satisfaction.     Milus Banister

## 2017-03-11 NOTE — Op Note (Signed)
[facility] Patient Name: Gabriel Poole Procedure Date: 03/11/2017 MRN: [id number] Attending MD: Milus Banister , MD Date of Birth: January 08, 1946 CSN: [id number] Age: 71 Admit Type: Outpatient Procedure:                Upper EUS Indications:              Pre-treatment staging of gastric adenocarcinoma Providers:                Milus Banister, MD, Cleda Daub, RN, Elspeth Cho Tech., Technician, Dione Booze, CRNA Referring MD:             Hildred Laser, MD Medicines:                Monitored Anesthesia Care Complications:            No immediate complications. Estimated blood loss:                            None. Estimated Blood Loss:     Estimated blood loss: none. Procedure:                Pre-Anesthesia Assessment:                           - Prior to the procedure, a History and Physical                            was performed, and patient medications and                            allergies were reviewed. The patient's tolerance of                            previous anesthesia was also reviewed. The risks                            and benefits of the procedure and the sedation                            options and risks were discussed with the patient.                            All questions were answered, and informed consent                            was obtained. Prior Anticoagulants: The patient has                            taken no previous anticoagulant or antiplatelet                            agents. ASA Grade Assessment: II - A patient with  mild systemic disease. After reviewing the risks                            and benefits, the patient was deemed in                            satisfactory condition to undergo the procedure.                           After obtaining informed consent, the endoscope was                            passed under direct vision. Throughout the                      procedure, the patient's blood pressure, pulse, and                            oxygen saturations were monitored continuously. The                            was introduced through the mouth, and advanced to                            the second part of duodenum. The upper EUS was                            accomplished without difficulty. The patient                            tolerated the procedure well. Scope In: Scope Out: Findings:      Endoscopic Finding :      1. The esophagus was normal.      2. There was a 2-3cm ulcerated, malignant mass along the greater       curvature of the stomach, approximately mid-body. The mass was       non-circumferential.      3. The duodenum was normal.      Endosonographic Finding :      1. The gastric mass above correlated with a hypoechoic and heterogenous       non-circumferential mass that measured 2.9cm across, 61mm deep. The       endosonographic borders were poorly defined and there was clear       sonographic evidence suggesting invasion into and through the muscularis       propria layer without evident invasion into nearby organs (uT3).      2. The duodenal lymphnode described on recent CT scan appeared reactive       by [place] criteria.      3. No perigastric adenopathy (uN0)      4. Limited views of the liver, spleen, pancreas, bile duct, gallbladder       were all normal. Impression:               - Along the greater curvature of the stomach,  approximately mid-body, there is a 2.9cm uT3N0                            (clinical stage IIB) gastric adenocarcinoma. Moderate Sedation:      N/A- Per Anesthesia Care Recommendation:           - Discharge patient to home (ambulatory).                           - I will communicate these findings with Dr. Laural Golden.                           - He will need referrals to medical oncology and                            surgery. Procedure Code(s):        ---  Professional ---                           [phone], Esophagogastroduodenoscopy, flexible,                            transoral; with endoscopic ultrasound examination                            limited to the esophagus, stomach or duodenum, and                            adjacent structures Diagnosis Code(s):        --- Professional ---                           K31.89, Other diseases of stomach and duodenum                           C16.9, Malignant neoplasm of stomach, unspecified CPT copyright 2016 American Medical Association. All rights reserved. The codes documented in this report are preliminary and upon coder review may  be revised to meet current compliance requirements. Milus Banister, MD 03/11/2017 11:22:53 AM This report has been signed electronically. Number of Addenda: 0

## 2017-03-12 ENCOUNTER — Encounter (HOSPITAL_COMMUNITY): Payer: Self-pay | Admitting: Gastroenterology

## 2017-03-22 DIAGNOSIS — C169 Malignant neoplasm of stomach, unspecified: Secondary | ICD-10-CM | POA: Diagnosis not present

## 2017-03-23 ENCOUNTER — Other Ambulatory Visit: Payer: Self-pay | Admitting: General Surgery

## 2017-03-23 DIAGNOSIS — C162 Malignant neoplasm of body of stomach: Secondary | ICD-10-CM | POA: Diagnosis not present

## 2017-03-25 ENCOUNTER — Other Ambulatory Visit: Payer: Self-pay | Admitting: General Surgery

## 2017-03-25 DIAGNOSIS — C162 Malignant neoplasm of body of stomach: Secondary | ICD-10-CM

## 2017-04-05 ENCOUNTER — Ambulatory Visit
Admission: RE | Admit: 2017-04-05 | Discharge: 2017-04-05 | Disposition: A | Discharge status: Home / Self Care | Payer: Medicare Other | Source: Ambulatory Visit | Attending: General Surgery | Admitting: General Surgery

## 2017-04-05 DIAGNOSIS — C162 Malignant neoplasm of body of stomach: Secondary | ICD-10-CM

## 2017-04-05 DIAGNOSIS — R918 Other nonspecific abnormal finding of lung field: Secondary | ICD-10-CM | POA: Diagnosis not present

## 2017-04-05 MED ORDER — IOPAMIDOL (ISOVUE-300) INJECTION 61%
75.0000 mL | Freq: Once | INTRAVENOUS | Status: AC | PRN
  Administered 2017-04-05: 75 mL via INTRAVENOUS

## 2017-04-07 ENCOUNTER — Other Ambulatory Visit: Payer: Self-pay | Admitting: General Surgery

## 2017-04-07 NOTE — Progress Notes (Signed)
Please let patient know chest CT shows the lungs look good.  There is a abnormality in the liver that is low risk, but radiology recommends an MRI, so we will order that.

## 2017-04-12 ENCOUNTER — Other Ambulatory Visit: Payer: Self-pay | Admitting: General Surgery

## 2017-04-12 DIAGNOSIS — K769 Liver disease, unspecified: Secondary | ICD-10-CM

## 2017-04-15 NOTE — Progress Notes (Signed)
NBZ:[id number], Caren Griffins, DO  Cardiologist:  EKG:  Stress test:  ECHO:12/18/10 in EPIC  Cardiac Cath:  Chest x-ray

## 2017-04-15 NOTE — Pre-Procedure Instructions (Signed)
SEON GAERTNER  04/15/2017      [id number] - [place], [place] - [address] [place] [address] Phone: [phone] Fax: [place], [place] [address] Phone: [phone] Fax: [phone]    Your procedure is scheduled on December, 13, 2018.  Report to [facility] Admitting at 1100 AM.  Call this number if you have problems the morning of surgery:  [phone]   Remember:  Do not eat food or drink liquids after midnight.  Take these medicines the morning of surgery with A SIP OF WATER amlodipine (norvasc), metoprolol (lopressor), protonix (pantoprazole), sucralfate (carafate).  7 days prior to surgery STOP taking any Aspirin (unless otherwise instructed by your surgeon), Aleve, Naproxen, Ibuprofen, Motrin, Advil, Goody's, BC's, all herbal medications, fish oil, and all vitamins  Continue all other medications as instructed by your physician except follow the above medication instructions before surgery   Do not wear jewelry, make-up or nail polish.  Do not wear lotions, powders, or perfumes, or deoderant.  Men may shave face and neck.  Do not bring valuables to the hospital.  [facility] is not responsible for any belongings or valuables.  Contacts, dentures or bridgework may not be worn into surgery.  Leave your suitcase in the car.  After surgery it may be brought to your room.  For patients admitted to the hospital, discharge time will be determined by your treatment team.  Patients discharged the day of surgery will not be allowed to drive home.   Special instructions:   [place]- Preparing For Surgery  Before surgery, you can play an important role. Because skin is not sterile, your skin needs to be as free of germs as possible. You can reduce the number of germs on your skin by washing with CHG (chlorahexidine gluconate) Soap before surgery.   CHG is an antiseptic cleaner which kills germs and bonds with the skin to continue killing germs even after washing.  Please do not use if you have an allergy to CHG or antibacterial soaps. If your skin becomes reddened/irritated stop using the CHG.  Do not shave (including legs and underarms) for at least 48 hours prior to first CHG shower. It is OK to shave your face.  Please follow these instructions carefully.   1. Shower the NIGHT BEFORE SURGERY and the MORNING OF SURGERY with CHG.   2. If you chose to wash your hair, wash your hair first as usual with your normal shampoo.  3. After you shampoo, rinse your hair and body thoroughly to remove the shampoo.  4. Use CHG as you would any other liquid soap. You can apply CHG directly to the skin and wash gently with a scrungie or a clean washcloth.   5. Apply the CHG Soap to your body ONLY FROM THE NECK DOWN.  Do not use on open wounds or open sores. Avoid contact with your eyes, ears, mouth and genitals (private parts). Wash Face and genitals (private parts)  with your normal soap.  6. Wash thoroughly, paying special attention to the area where your surgery will be performed.  7. Thoroughly rinse your body with warm water from the neck down.  8. DO NOT shower/wash with your normal soap after using and rinsing off the CHG Soap.  9. Pat yourself dry with a CLEAN TOWEL.  10. Wear CLEAN PAJAMAS to bed the night  before surgery, wear comfortable clothes the morning of surgery  11. Place CLEAN SHEETS on your bed the night of your first shower and DO NOT SLEEP WITH PETS.    Day of Surgery: Do not apply any deodorants/lotions. Please wear clean clothes to the hospital/surgery center.     Please read over the following fact sheets that you were given. Pain Booklet, Coughing and Deep Breathing and Surgical Site Infection Prevention

## 2017-04-16 ENCOUNTER — Encounter (HOSPITAL_COMMUNITY)
Admission: RE | Admit: 2017-04-16 | Discharge: 2017-04-16 | Disposition: A | Discharge status: Home / Self Care | Payer: Medicare HMO | Source: Ambulatory Visit | Attending: General Surgery | Admitting: General Surgery

## 2017-04-16 ENCOUNTER — Other Ambulatory Visit: Payer: Self-pay

## 2017-04-16 ENCOUNTER — Encounter (HOSPITAL_COMMUNITY): Payer: Self-pay

## 2017-04-16 DIAGNOSIS — R001 Bradycardia, unspecified: Secondary | ICD-10-CM | POA: Diagnosis not present

## 2017-04-16 DIAGNOSIS — E785 Hyperlipidemia, unspecified: Secondary | ICD-10-CM | POA: Diagnosis not present

## 2017-04-16 DIAGNOSIS — Z01810 Encounter for preprocedural cardiovascular examination: Secondary | ICD-10-CM | POA: Diagnosis not present

## 2017-04-16 DIAGNOSIS — I10 Essential (primary) hypertension: Secondary | ICD-10-CM | POA: Diagnosis not present

## 2017-04-16 DIAGNOSIS — Z01812 Encounter for preprocedural laboratory examination: Secondary | ICD-10-CM | POA: Diagnosis not present

## 2017-04-16 DIAGNOSIS — Z7982 Long term (current) use of aspirin: Secondary | ICD-10-CM | POA: Insufficient documentation

## 2017-04-16 DIAGNOSIS — R011 Cardiac murmur, unspecified: Secondary | ICD-10-CM | POA: Diagnosis not present

## 2017-04-16 DIAGNOSIS — Z79899 Other long term (current) drug therapy: Secondary | ICD-10-CM | POA: Insufficient documentation

## 2017-04-16 DIAGNOSIS — C169 Malignant neoplasm of stomach, unspecified: Secondary | ICD-10-CM | POA: Diagnosis not present

## 2017-04-16 LAB — TYPE AND SCREEN
ABO/RH(D): O POS
ANTIBODY SCREEN: NEGATIVE

## 2017-04-16 LAB — COMPREHENSIVE METABOLIC PANEL
ALBUMIN: 4 g/dL (ref 3.5–5.0)
ALK PHOS: 95 U/L (ref 38–126)
ALT: 21 U/L (ref 17–63)
AST: 29 U/L (ref 15–41)
Anion gap: 8 (ref 5–15)
BILIRUBIN TOTAL: 1.1 mg/dL (ref 0.3–1.2)
BUN: 20 mg/dL (ref 6–20)
CALCIUM: 9.6 mg/dL (ref 8.9–10.3)
CO2: 25 mmol/L (ref 22–32)
Chloride: 107 mmol/L (ref 101–111)
Creatinine, Ser: 1.15 mg/dL (ref 0.61–1.24)
GFR calc Af Amer: >60 mL/min (ref >60)
GFR calc non Af Amer: >60 mL/min (ref >60)
GLUCOSE: 90 mg/dL (ref 65–99)
POTASSIUM: 3.9 mmol/L (ref 3.5–5.1)
SODIUM: 140 mmol/L (ref 135–145)
TOTAL PROTEIN: 6.9 g/dL (ref 6.5–8.1)

## 2017-04-16 LAB — CBC WITH DIFFERENTIAL/PLATELET
BASOS ABS: 0.1 10*3/uL (ref 0.0–0.1)
BASOS PCT: 1 %
EOS ABS: 0.3 10*3/uL (ref 0.0–0.7)
Eosinophils Relative: 4 %
HEMATOCRIT: 41.7 % (ref 39.0–52.0)
HEMOGLOBIN: 13.6 g/dL (ref 13.0–17.0)
Lymphocytes Relative: 24 %
Lymphs Abs: 2.2 10*3/uL (ref 0.7–4.0)
MCH: 29.9 pg (ref 26.0–34.0)
MCHC: 32.6 g/dL (ref 30.0–36.0)
MCV: 91.6 fL (ref 78.0–100.0)
Monocytes Absolute: 0.6 10*3/uL (ref 0.1–1.0)
Monocytes Relative: 6 %
NEUTROS ABS: 5.9 10*3/uL (ref 1.7–7.7)
NEUTROS PCT: 65 %
Platelets: 262 10*3/uL (ref 150–400)
RBC: 4.55 MIL/uL (ref 4.22–5.81)
RDW: 13.2 % (ref 11.5–15.5)
WBC: 9 10*3/uL (ref 4.0–10.5)

## 2017-04-16 LAB — PROTIME-INR
INR: 1
Prothrombin Time: 13.1 seconds (ref 11.4–15.2)

## 2017-04-16 LAB — ABO/RH: ABO/RH(D): O POS

## 2017-04-16 MED ORDER — ENSURE SURGERY PO LIQD
237.0000 mL | Freq: Two times a day (BID) | ORAL | Status: DC
  Filled 2017-04-16: qty 237

## 2017-04-16 NOTE — Progress Notes (Addendum)
PCP - Dr. Melina Copa  Cardiologist - Denies  Chest x-ray - Denies  EKG - 04/16/17  Stress Test - Denies  ECHO - 12/18/10 (E)  Cardiac Cath - Denies  Sleep Study - No CPAP - None  LABS- Pending: CBCw/D, CMP, PT, PTT, T/S  This writer spoke with April from Dr. Marlowe Aschoff office, and she sts the pt to HOLD Aspirin x 5 days. Chart will be given to anesthesia for review, per surgical order regarding epidural.  Pt denies having chest pain, sob, or fever at this time. All instructions explained to the pt, with a verbal understanding of the material. Pt agrees to go over the instructions while at home for a better understanding. The opportunity to ask questions was provided.

## 2017-04-16 NOTE — Progress Notes (Addendum)
Gabriel Poole            04/15/2017                          [id number] - [place], [place] - [address] [place] [address] Phone: [phone] Fax: [place], [place] [address] Phone: [phone] Fax: [phone]              Your procedure is scheduled on December, 13, 2018.            Report to [facility] Admitting at 1100 AM.            Call this number if you have problems the morning of surgery:            [phone]             Remember:            Do not eat food or drink liquids after midnight.            Take these medicines the morning of surgery with A SIP OF WATER amlodipine (norvasc), metoprolol (lopressor), protonix (pantoprazole), sucralfate (carafate).  Follow your doctor's instruction regarding Aspirin. HOLD 12/8, per Dr. Barry Dienes.  As of today, STOP taking any Aspirin (unless otherwise instructed by your surgeon), Aleve, Naproxen, Ibuprofen, Motrin, Advil, Goody's, BC's, all herbal medications, fish oil, and all vitamins  Please drink the Ensure Liquids 2 TIMES A DAY BETWEEN MEALS FOR 5 DAYS STARTING 12/8-12/12, AS INSTRUCTED.  Please complete your PRE-SURGERY ENSURE that was given to before you leave your house the morning of surgery.  Please, if able, drink it in one setting. DO NOT SIP.             Do not  wear jewlery.            Do not wear lotions, powders, colognes, or deodorant.            Men may shave face and neck.            Do not bring valuables to the hospital.            [facility] is not responsible for any belongings or valuables.  Contacts, dentures or bridgework may not be worn into surgery.  Leave your suitcase in the car.  After surgery it may be brought to your room.  For patients admitted to the hospital, discharge time will be determined by your treatment team.  Patients discharged the day of surgery  will not be allowed to drive home.   Special instructions:   [place]- Preparing For Surgery  Before surgery, you can play an important role. Because skin is not sterile, your skin needs to be as free of germs as possible. You can reduce the number of germs on your skin by washing with CHG (chlorahexidine gluconate) Soap before surgery.  CHG is an antiseptic cleaner which kills germs and bonds with the skin to continue killing germs even after washing.  Please do not use if you have an allergy to CHG or antibacterial soaps. If your skin becomes reddened/irritated stop using the CHG.  Do not shave (including legs and underarms) for at least 48 hours prior to first CHG shower. It is OK to shave your face.  Please follow these instructions carefully.  1. Shower the NIGHT BEFORE SURGERY and the MORNING OF SURGERY with CHG.   2. If you chose to wash your hair, wash your hair first as usual with your normal shampoo.  3. After you shampoo, rinse your hair and body thoroughly to remove the shampoo.  4. Use CHG as you would any other liquid soap. You can apply CHG directly to the skin and wash gently with a scrungie or a clean washcloth.   5. Apply the CHG Soap to your body ONLY FROM THE NECK DOWN.  Do not use on open wounds or open sores. Avoid contact with your eyes, ears, mouth and genitals (private parts). Wash Face and genitals (private parts)  with your normal soap.  6. Wash thoroughly, paying special attention to the area where your surgery will be performed.  7. Thoroughly rinse your body with warm water from the neck down.  8. DO NOT shower/wash with your normal soap after using and rinsing off the CHG Soap.  9. Pat yourself dry with a CLEAN TOWEL.  10. Wear CLEAN PAJAMAS to bed the night before surgery, wear comfortable clothes the morning of  surgery  11. Place CLEAN SHEETS on your bed the night of your first shower and DO NOT SLEEP WITH PETS.  Day of Surgery: Do not apply any deodorants/lotions. Please wear clean clothes to the hospital/surgery center.    Please read over all materials that were given.

## 2017-04-20 ENCOUNTER — Inpatient Hospital Stay
Admission: RE | Admit: 2017-04-20 | Discharge: 2017-04-20 | Disposition: A | Discharge status: Home / Self Care | Payer: Medicare HMO | Source: Ambulatory Visit | Attending: General Surgery | Admitting: General Surgery

## 2017-04-20 NOTE — H&P (Signed)
Gabriel Poole Location: [facility] Surgery Patient #: [id number] DOB: 1945-12-27 Divorced / Language: English / Race: Black or African American Male   History of Present Illness The patient is a 71 year old male who presents with gastric cancer. Patient is a 71 year old male referred by Dr. Ardis Hughs with a new diagnosis of gastric cancer. He presented to Dr. Laural Golden with around 4 months of weight loss, epigastric pain, and bloating. He denies any history of GI bleeding or fatigue. He was worked up for gallstones with an ultrasound and that was negative. He is in good shape overall and continuing to work. He works at [employer] however and the smells of the food makes him feel constantly nauseated. He is able to eat well at home. He had a ulcerated mass seen on the greater curve at EGD by Dr. Laural Golden and he was referred to Dr. Ardis Hughs for endoscopic ultrasound. The mass seemed to be localized with only about a 2-3 cm size. He had no evidence of linitis plastica. He was started on carafate and protonix. His pain and appetite have been better since then.   pathology 02/17/2017 EGD Diagnosis Stomach, biopsy, gastric ulcer - ADENOCARCINOMA.  EGD 10/10- rehman The examined esophagus was normal. Findings: The Z-line was irregular and was found 44 cm from the incisors. Mucosa coated with red blood in the gastric body and in the gastric antrum traced to gastric ulcer One cratered gastric ulcer was found at the incisura and in the gastric antrum. The lesion was twenty mm by twenty-five mm in largest dimension. Biopsies were taken with a cold forceps for histology. Patchy erythematous mucosa was found in the gastric antrum. The cardia, gastric fundus, gastric body and pylorus were normal. The duodenal bulb and second portion of the duodenum were normal.  EUS 11/1- jacobs 1. The gastric mass above correlated with a hypoechoic and heterogenous non-circumferential mass that measured  2.9cm across, 1mm deep. The endosonographic borders were poorly defined and there was clear sonographic evidence suggesting invasion into and through the muscularis propria layer without evident invasion into nearby organs (uT3). 2. The duodenal lymphnode described on recent CT scan appeared reactive by [place] criteria. 3. No perigastric adenopathy (uN0) 4. Limited views of the liver, spleen, pancreas, bile duct, gallbladder were all normal.  Along the greater curvature of the stomach, approximately mid-body, there is a 2.9cm uT3N0 (clinical stage IIB) gastric adenocarcinoma.  CT abd/pel IMPRESSION: 1. No acute abdominopelvic findings. 2. No explanation for abdominal pain. 3. Bilateral benign-appearing renal cysts. 4. Prostatomegaly (140 cc gland). ADDENDUM: Additional clinical history was provided by referring physician. There is a gastric adenocarcinoma in the antral region of the stomach. There is thickening through the antral region which is slightly asymmetric measuring 10 mm (image 16, series 2). No retained contents in stomach. No adenopathy in the gastrohepatic ligament. Single 8 mm lymph node adjacent to the duodenum bulb (image 18, series 2).   Diagnostic Studies History Colonoscopy  1-5 years ago  Allergies No Known Drug Allergies 03/23/2017 Allergies Reconciled   Medication History AmLODIPine Besylate (10MG  Tablet, Oral) Active. Aspirin (81MG  Tablet, Oral) Active. Atorvastatin Calcium (40MG  Tablet, Oral) Active. Cholecalciferol (2000UNIT Tablet, Oral) Active. Metoprolol Tartrate (50MG  Tablet, Oral) Active. Pantoprazole Sodium (40MG  Tablet DR, Oral) Active. Sucralfate (1GM Tablet, Oral) Active. Medications Reconciled  Social History  Alcohol use  Occasional alcohol use. Caffeine use  Carbonated beverages, Coffee. Tobacco use  Current every day smoker.  Family History Cancer  Family Members In General. Diabetes Mellitus  Family Members In  General.  Other Problems Alcohol Abuse  Arthritis  Enlarged Prostate  Gastric Ulcer  High blood pressure  Hypercholesterolemia     Review of Systems General Present- Appetite Loss and Weight Loss. Not Present- Chills, Fatigue, Fever, Night Sweats and Weight Gain. Skin Not Present- Change in Wart/Mole, Dryness, Hives, Jaundice, New Lesions, Non-Healing Wounds, Rash and Ulcer. HEENT Not Present- Earache, Hearing Loss, Hoarseness, Nose Bleed, Oral Ulcers, Ringing in the Ears, Seasonal Allergies, Sinus Pain, Sore Throat, Visual Disturbances, Wears glasses/contact lenses and Yellow Eyes. Respiratory Present- Chronic Cough. Not Present- Bloody sputum, Difficulty Breathing, Snoring and Wheezing. Breast Not Present- Breast Mass, Breast Pain, Nipple Discharge and Skin Changes. Cardiovascular Not Present- Chest Pain, Difficulty Breathing Lying Down, Leg Cramps, Palpitations, Rapid Heart Rate, Shortness of Breath and Swelling of Extremities. Gastrointestinal Present- Abdominal Pain, Excessive gas and Gets full quickly at meals. Not Present- Bloating, Bloody Stool, Change in Bowel Habits, Chronic diarrhea, Constipation, Difficulty Swallowing, Hemorrhoids, Indigestion, Nausea, Rectal Pain and Vomiting. Male Genitourinary Present- Frequency and Nocturia. Not Present- Blood in Urine, Change in Urinary Stream, Impotence, Painful Urination, Urgency and Urine Leakage. Musculoskeletal Not Present- Back Pain, Joint Pain, Joint Stiffness, Muscle Pain, Muscle Weakness and Swelling of Extremities. Neurological Not Present- Decreased Memory, Fainting, Headaches, Numbness, Seizures, Tingling, Tremor, Trouble walking and Weakness. Psychiatric Not Present- Anxiety, Bipolar, Change in Sleep Pattern, Depression, Fearful and Frequent crying. Endocrine Not Present- Cold Intolerance, Excessive Hunger, Hair Changes, Heat Intolerance, Hot flashes and New Diabetes. Hematology Present- Blood Thinners. Not Present- Easy  Bruising, Excessive bleeding, Gland problems, HIV and Persistent Infections.  Vitals  Weight: 160 lb Height: 70in Body Surface Area: 1.9 m Body Mass Index: 22.96 kg/m  Temp.: 98.59F  Pulse: 64 (Regular)  BP: 132/86 (Sitting, Left Arm, Standard)       Physical Exam  General Mental Status-Alert. General Appearance-Consistent with stated age. Hydration-Well hydrated. Voice-Normal.  Head and Neck Head-normocephalic, atraumatic with no lesions or palpable masses. Trachea-midline. Thyroid Gland Characteristics - normal size and consistency.  Eye Eyeball - Bilateral-Extraocular movements intact. Sclera/Conjunctiva - Bilateral-No scleral icterus.  ENMT Note: several teeth missing on left.   Chest and Lung Exam Chest and lung exam reveals -quiet, even and easy respiratory effort with no use of accessory muscles and on auscultation, normal breath sounds, no adventitious sounds and normal vocal resonance. Inspection Chest Wall - Normal. Back - normal.  Cardiovascular Cardiovascular examination reveals -normal heart sounds, regular rate and rhythm with no murmurs and normal pedal pulses bilaterally.  Abdomen Inspection Inspection of the abdomen reveals - No Hernias. Palpation/Percussion Palpation and Percussion of the abdomen reveal - Soft, Non Tender, No Rebound tenderness, No Rigidity (guarding) and No hepatosplenomegaly. Auscultation Auscultation of the abdomen reveals - Bowel sounds normal.  Neurologic Neurologic evaluation reveals -alert and oriented x 3 with no impairment of recent or remote memory. Mental Status-Normal.  Musculoskeletal Global Assessment -Note: no gross deformities.  Normal Exam - Left-Upper Extremity Strength Normal and Lower Extremity Strength Normal. Normal Exam - Right-Upper Extremity Strength Normal and Lower Extremity Strength Normal.  Lymphatic Head & Neck  General Head & Neck Lymphatics:  Bilateral - Description - Normal. Axillary  General Axillary Region: Bilateral - Description - Normal. Tenderness - Non Tender. Femoral & Inguinal  Generalized Femoral & Inguinal Lymphatics: Bilateral - Description - No Generalized lymphadenopathy.    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 By EGD this sounds to  be proximal antrum, but by EUS it sounds to be mid body.  Either way he appears to have localized gastric cancer. I will order a chest CT to complete staging work up. As long as negative, he would be candidate for surgery.  I will plan dx laparoscopy, followed by partial/distal gastrectomy with feeding tube placement. I do not think he will be a candidate for Billroth I reconstruction based on description. I will plan loop gastrojejunostomy. He will need feeding tube to assist wtih recovery.  I reviewed surgery with him and his family. Discussed rationale of diagnostic laparoscopy. Reviewed how I would hook things up. I discussed risks of surgery, especially the risk of prolonged nausea/vomiting. I reviewed why we need a feeding tube.  We will set him up for surgery at the first available opportunity.  45 min spent in evaluation, examination, counseling, and coordination of care. >50% spent in counseling. Current Plans You are being scheduled for surgery- Our schedulers will call you.  You should hear from our office's scheduling department within 5 working days about the location, date, and time of surgery. We try to make accommodations for patient's preferences in scheduling surgery, but sometimes the OR schedule or the surgeon's schedule prevents [place] from making those accommodations.  If you have not heard from our office [phone]) in 5 working days, call the office and ask for your surgeon's nurse.  If you have other questions about your diagnosis, plan, or surgery, call the office and ask for your  surgeon's nurse.  Pt Education - [facility] Free Text Education/Instructions: discussed with patient and provided information. CT CHEST W CON (88916) (staging for gastric cancer)

## 2017-04-21 ENCOUNTER — Ambulatory Visit (HOSPITAL_COMMUNITY)
Admission: RE | Admit: 2017-04-21 | Discharge: 2017-04-21 | Disposition: A | Discharge status: Home / Self Care | Payer: Medicare HMO | Source: Ambulatory Visit | Attending: General Surgery | Admitting: General Surgery

## 2017-04-21 ENCOUNTER — Inpatient Hospital Stay (HOSPITAL_COMMUNITY)
Admission: RE | Admit: 2017-04-21 | Discharge: 2017-04-28 | DRG: 326 | Disposition: A | Discharge status: Home Health | Payer: Medicare HMO | Source: Ambulatory Visit | Attending: General Surgery | Admitting: General Surgery

## 2017-04-21 DIAGNOSIS — E871 Hypo-osmolality and hyponatremia: Secondary | ICD-10-CM | POA: Diagnosis present

## 2017-04-21 DIAGNOSIS — D3501 Benign neoplasm of right adrenal gland: Secondary | ICD-10-CM | POA: Insufficient documentation

## 2017-04-21 DIAGNOSIS — E440 Moderate protein-calorie malnutrition: Secondary | ICD-10-CM

## 2017-04-21 DIAGNOSIS — E43 Unspecified severe protein-calorie malnutrition: Secondary | ICD-10-CM | POA: Diagnosis present

## 2017-04-21 DIAGNOSIS — K259 Gastric ulcer, unspecified as acute or chronic, without hemorrhage or perforation: Secondary | ICD-10-CM | POA: Diagnosis present

## 2017-04-21 DIAGNOSIS — Z6824 Body mass index (BMI) 24.0-24.9, adult: Secondary | ICD-10-CM | POA: Diagnosis not present

## 2017-04-21 DIAGNOSIS — N400 Enlarged prostate without lower urinary tract symptoms: Secondary | ICD-10-CM | POA: Diagnosis present

## 2017-04-21 DIAGNOSIS — R1013 Epigastric pain: Secondary | ICD-10-CM | POA: Diagnosis present

## 2017-04-21 DIAGNOSIS — I10 Essential (primary) hypertension: Secondary | ICD-10-CM | POA: Diagnosis present

## 2017-04-21 DIAGNOSIS — D62 Acute posthemorrhagic anemia: Secondary | ICD-10-CM | POA: Diagnosis present

## 2017-04-21 DIAGNOSIS — C163 Malignant neoplasm of pyloric antrum: Secondary | ICD-10-CM | POA: Diagnosis present

## 2017-04-21 DIAGNOSIS — N281 Cyst of kidney, acquired: Secondary | ICD-10-CM | POA: Diagnosis present

## 2017-04-21 DIAGNOSIS — K769 Liver disease, unspecified: Secondary | ICD-10-CM | POA: Diagnosis not present

## 2017-04-21 DIAGNOSIS — C162 Malignant neoplasm of body of stomach: Principal | ICD-10-CM | POA: Diagnosis present

## 2017-04-21 DIAGNOSIS — E785 Hyperlipidemia, unspecified: Secondary | ICD-10-CM | POA: Diagnosis not present

## 2017-04-21 DIAGNOSIS — M7989 Other specified soft tissue disorders: Secondary | ICD-10-CM | POA: Diagnosis not present

## 2017-04-21 DIAGNOSIS — Q859 Phakomatosis, unspecified: Secondary | ICD-10-CM | POA: Diagnosis not present

## 2017-04-21 DIAGNOSIS — C772 Secondary and unspecified malignant neoplasm of intra-abdominal lymph nodes: Secondary | ICD-10-CM | POA: Diagnosis not present

## 2017-04-21 DIAGNOSIS — G8918 Other acute postprocedural pain: Secondary | ICD-10-CM | POA: Diagnosis not present

## 2017-04-21 DIAGNOSIS — R638 Other symptoms and signs concerning food and fluid intake: Secondary | ICD-10-CM

## 2017-04-21 DIAGNOSIS — C169 Malignant neoplasm of stomach, unspecified: Secondary | ICD-10-CM | POA: Diagnosis not present

## 2017-04-21 MED ORDER — GADOBENATE DIMEGLUMINE 529 MG/ML IV SOLN
15.0000 mL | Freq: Once | INTRAVENOUS | Status: AC | PRN
  Administered 2017-04-21: 15 mL via INTRAVENOUS

## 2017-04-21 NOTE — Progress Notes (Signed)
Anesthesia Chart Review:  Pt is a 71 year old male scheduled for diagnostic laparoscopy, partial gastrectomy versus subtotal gastrectomy, feeding tube placement in 04/22/2017 with Stark Klein, M.D.   [facility] includes: HTN, heart murmur, hyperlipidemia, gastric cancer. Former smoker. BMI 23.  Medications include: Amlodipine, ASA 81 mg, Lipitor, metoprolol, Protonix  BP 137/76   Pulse 62   Temp 36.8 C   Resp 20   Ht 5\' 10"  (1.778 m)   Wt 162 lb 6.4 oz (73.7 kg)   SpO2 99%   BMI 23.30 kg/m   Preoperative labs reviewed.    CT chest 04/06/17:  1. No acute cardiopulmonary abnormalities. 2. Two small pulmonary nodules are noted measuring up to 5 mm. Nonspecific but warrant attention on follow-up imaging follow up 3. Nonspecific hyperdense, possibly enhancing lesion along the dome of liver is identified. In a patient that is at increased risk for more definitive assessment of this structure with contrast enhanced MRI of the liver is advised.  EKG 04/16/17: Sinus bradycardia (56 bpm). Minimal voltage criteria for LVH, may be normal variant.  Nonspecific T wave abnormality  Echo 12/18/10:  - Left ventricle: The cavity size was normal. Wall thickness wasincreased in a pattern of severe LVH. Systolic function wasnormal. The estimated ejection fraction was in the range of 60% to65%. Wall motion was normal; there were no regional wall motionabnormalities. Doppler parameters are consistent with abnormalleft ventricular relaxation (grade 1 diastolic dysfunction). - Pericardium, extracardiac: A trivial pericardial effusion wasidentified.  I called and spoke with pt by telephone.  His case is posted for epidural and GA.  I gave pt an overview of the epidural and why it will be used for him.  He knows he can ask questions of the anesthesia team day of surgery.   If no changes, I anticipate pt can proceed with surgery as scheduled.    Willeen Cass, FNP-BC [facility] Short Stay Surgical  Center/Anesthesiology Phone: [phone] 04/21/2017 3:39 PM

## 2017-04-22 ENCOUNTER — Inpatient Hospital Stay (HOSPITAL_COMMUNITY): Payer: Medicare HMO | Admitting: Emergency Medicine

## 2017-04-22 ENCOUNTER — Encounter (HOSPITAL_COMMUNITY)
Admission: RE | Disposition: A | Discharge status: Home Health | Payer: Self-pay | Source: Ambulatory Visit | Attending: General Surgery

## 2017-04-22 ENCOUNTER — Encounter (HOSPITAL_COMMUNITY): Payer: Self-pay | Admitting: Certified Registered Nurse Anesthetist

## 2017-04-22 ENCOUNTER — Other Ambulatory Visit: Payer: Self-pay

## 2017-04-22 DIAGNOSIS — R1013 Epigastric pain: Secondary | ICD-10-CM | POA: Diagnosis present

## 2017-04-22 DIAGNOSIS — C163 Malignant neoplasm of pyloric antrum: Secondary | ICD-10-CM | POA: Diagnosis present

## 2017-04-22 DIAGNOSIS — M7989 Other specified soft tissue disorders: Secondary | ICD-10-CM | POA: Diagnosis not present

## 2017-04-22 DIAGNOSIS — K259 Gastric ulcer, unspecified as acute or chronic, without hemorrhage or perforation: Secondary | ICD-10-CM | POA: Diagnosis present

## 2017-04-22 DIAGNOSIS — C169 Malignant neoplasm of stomach, unspecified: Secondary | ICD-10-CM | POA: Diagnosis not present

## 2017-04-22 DIAGNOSIS — N400 Enlarged prostate without lower urinary tract symptoms: Secondary | ICD-10-CM | POA: Diagnosis present

## 2017-04-22 DIAGNOSIS — E43 Unspecified severe protein-calorie malnutrition: Secondary | ICD-10-CM | POA: Diagnosis present

## 2017-04-22 DIAGNOSIS — Z6824 Body mass index (BMI) 24.0-24.9, adult: Secondary | ICD-10-CM | POA: Diagnosis not present

## 2017-04-22 DIAGNOSIS — C162 Malignant neoplasm of body of stomach: Secondary | ICD-10-CM | POA: Diagnosis present

## 2017-04-22 DIAGNOSIS — N281 Cyst of kidney, acquired: Secondary | ICD-10-CM | POA: Diagnosis present

## 2017-04-22 DIAGNOSIS — I10 Essential (primary) hypertension: Secondary | ICD-10-CM | POA: Diagnosis present

## 2017-04-22 DIAGNOSIS — Q859 Phakomatosis, unspecified: Secondary | ICD-10-CM | POA: Diagnosis not present

## 2017-04-22 DIAGNOSIS — E871 Hypo-osmolality and hyponatremia: Secondary | ICD-10-CM | POA: Diagnosis present

## 2017-04-22 DIAGNOSIS — C772 Secondary and unspecified malignant neoplasm of intra-abdominal lymph nodes: Secondary | ICD-10-CM | POA: Diagnosis not present

## 2017-04-22 DIAGNOSIS — D62 Acute posthemorrhagic anemia: Secondary | ICD-10-CM | POA: Diagnosis present

## 2017-04-22 HISTORY — PX: GASTRECTOMY: SHX58

## 2017-04-22 HISTORY — PX: LAPAROSCOPY: SHX197

## 2017-04-22 HISTORY — PX: GASTROJEJUNOSTOMY: SHX1697

## 2017-04-22 SURGERY — LAPAROSCOPY, DIAGNOSTIC
Anesthesia: Epidural | Site: Abdomen

## 2017-04-22 MED ORDER — DIPHENHYDRAMINE HCL 12.5 MG/5ML PO ELIX
12.5000 mg | ORAL_SOLUTION | Freq: Four times a day (QID) | ORAL | Status: DC | PRN
  Filled 2017-04-22: qty 5

## 2017-04-22 MED ORDER — DIPHENHYDRAMINE HCL 50 MG/ML IJ SOLN: 12.5000 mg | Freq: Four times a day (QID) | INTRAMUSCULAR | Status: DC | PRN

## 2017-04-22 MED ORDER — CHLORHEXIDINE GLUCONATE CLOTH 2 % EX PADS: 6.0000 | MEDICATED_PAD | Freq: Once | CUTANEOUS | Status: DC

## 2017-04-22 MED ORDER — ONDANSETRON HCL 4 MG/2ML IJ SOLN
INTRAMUSCULAR | Status: DC | PRN
  Administered 2017-04-22: 4 mg via INTRAVENOUS

## 2017-04-22 MED ORDER — CIPROFLOXACIN IN D5W 400 MG/200ML IV SOLN
INTRAVENOUS | Status: AC
  Filled 2017-04-22: qty 200

## 2017-04-22 MED ORDER — BUPIVACAINE-EPINEPHRINE (PF) 0.25% -1:200000 IJ SOLN
INTRAMUSCULAR | Status: AC
  Filled 2017-04-22: qty 30

## 2017-04-22 MED ORDER — ACETAMINOPHEN 500 MG PO TABS
1000.0000 mg | ORAL_TABLET | ORAL | Status: AC
  Administered 2017-04-22: 1000 mg via ORAL

## 2017-04-22 MED ORDER — LACTATED RINGERS IV SOLN
INTRAVENOUS | Status: DC | PRN
  Administered 2017-04-22 (×2): via INTRAVENOUS

## 2017-04-22 MED ORDER — NALOXONE HCL 0.4 MG/ML IJ SOLN: 0.4000 mg | INTRAMUSCULAR | Status: DC | PRN

## 2017-04-22 MED ORDER — ROPIVACAINE HCL 2 MG/ML IJ SOLN
5.0000 mL/h | INTRAMUSCULAR | Status: DC
  Administered 2017-04-23 – 2017-04-25 (×2): 5 mL/h via EPIDURAL
  Filled 2017-04-22 (×4): qty 200

## 2017-04-22 MED ORDER — HYDRALAZINE HCL 20 MG/ML IJ SOLN: 10.0000 mg | INTRAMUSCULAR | Status: DC | PRN

## 2017-04-22 MED ORDER — FENTANYL CITRATE (PF) 100 MCG/2ML IJ SOLN
INTRAMUSCULAR | Status: DC | PRN
  Administered 2017-04-22 (×2): 50 ug via INTRAVENOUS

## 2017-04-22 MED ORDER — SCOPOLAMINE 1 MG/3DAYS TD PT72
MEDICATED_PATCH | TRANSDERMAL | Status: AC
  Administered 2017-04-22: 1.5 mg via TRANSDERMAL
  Filled 2017-04-22: qty 1

## 2017-04-22 MED ORDER — ROPIVACAINE HCL 2 MG/ML IJ SOLN
2.0000 mL/h | INTRAMUSCULAR | Status: AC
  Administered 2017-04-22: 10 mL/h via EPIDURAL
  Filled 2017-04-22: qty 200

## 2017-04-22 MED ORDER — GABAPENTIN 300 MG PO CAPS
ORAL_CAPSULE | ORAL | Status: AC
  Administered 2017-04-22: 300 mg via ORAL
  Filled 2017-04-22: qty 1

## 2017-04-22 MED ORDER — KCL IN DEXTROSE-NACL 20-5-0.45 MEQ/L-%-% IV SOLN
INTRAVENOUS | Status: DC
  Administered 2017-04-22 – 2017-04-23 (×2): via INTRAVENOUS
  Administered 2017-04-24: 50 mL/h via INTRAVENOUS
  Administered 2017-04-24 – 2017-04-25 (×2): via INTRAVENOUS
  Filled 2017-04-22 (×6): qty 1000

## 2017-04-22 MED ORDER — SCOPOLAMINE 1 MG/3DAYS TD PT72
1.0000 | MEDICATED_PATCH | TRANSDERMAL | Status: DC
  Administered 2017-04-22: 1.5 mg via TRANSDERMAL

## 2017-04-22 MED ORDER — MIDAZOLAM HCL 5 MG/5ML IJ SOLN
INTRAMUSCULAR | Status: DC | PRN
  Administered 2017-04-22: 1 mg via INTRAVENOUS

## 2017-04-22 MED ORDER — MORPHINE SULFATE 2 MG/ML IV SOLN
INTRAVENOUS | Status: DC
  Administered 2017-04-22: 17:00:00 via INTRAVENOUS
  Administered 2017-04-22: 8 mg via INTRAVENOUS
  Administered 2017-04-22: 1 mg via INTRAVENOUS
  Administered 2017-04-23: 8 mg via INTRAVENOUS
  Administered 2017-04-23: 6 mg via INTRAVENOUS
  Administered 2017-04-23 – 2017-04-24 (×2): 2 mg via INTRAVENOUS
  Administered 2017-04-24: 01:00:00 via INTRAVENOUS
  Administered 2017-04-24: 5 mg via INTRAVENOUS
  Administered 2017-04-24: 6 mg via INTRAVENOUS
  Administered 2017-04-24 – 2017-04-25 (×3): 4 mg via INTRAVENOUS
  Administered 2017-04-25: 09:00:00 via INTRAVENOUS
  Administered 2017-04-25: 12 mg via INTRAVENOUS
  Administered 2017-04-25: 8 mg via INTRAVENOUS
  Administered 2017-04-25: 2 mg via INTRAVENOUS
  Administered 2017-04-25: 5 mg via INTRAVENOUS
  Administered 2017-04-26 (×2): 12 mg via INTRAVENOUS
  Administered 2017-04-26: 18:00:00 via INTRAVENOUS
  Administered 2017-04-26: 1.8 mg via INTRAVENOUS
  Administered 2017-04-26: 6 mg via INTRAVENOUS
  Filled 2017-04-22 (×5): qty 30

## 2017-04-22 MED ORDER — GABAPENTIN 300 MG PO CAPS
300.0000 mg | ORAL_CAPSULE | ORAL | Status: AC
  Administered 2017-04-22: 300 mg via ORAL

## 2017-04-22 MED ORDER — CIPROFLOXACIN IN D5W 400 MG/200ML IV SOLN
400.0000 mg | Freq: Two times a day (BID) | INTRAVENOUS | Status: AC
  Administered 2017-04-22: 400 mg via INTRAVENOUS
  Filled 2017-04-22: qty 200

## 2017-04-22 MED ORDER — LIDOCAINE HCL (CARDIAC) 20 MG/ML IV SOLN
INTRAVENOUS | Status: DC | PRN
  Administered 2017-04-22: 20 mg via INTRAVENOUS

## 2017-04-22 MED ORDER — LIDOCAINE-EPINEPHRINE (PF) 1.5 %-1:200000 IJ SOLN
INTRAMUSCULAR | Status: DC | PRN
  Administered 2017-04-22: 5 mL via EPIDURAL
  Administered 2017-04-22: 2 mL via EPIDURAL

## 2017-04-22 MED ORDER — CIPROFLOXACIN IN D5W 400 MG/200ML IV SOLN
400.0000 mg | INTRAVENOUS | Status: AC
  Administered 2017-04-22: 400 mg via INTRAVENOUS

## 2017-04-22 MED ORDER — ONDANSETRON HCL 4 MG/2ML IJ SOLN: 4.0000 mg | Freq: Four times a day (QID) | INTRAMUSCULAR | Status: DC | PRN

## 2017-04-22 MED ORDER — PHENYLEPHRINE HCL 10 MG/ML IJ SOLN
INTRAVENOUS | Status: DC | PRN
  Administered 2017-04-22: 20 ug/min via INTRAVENOUS

## 2017-04-22 MED ORDER — LIDOCAINE HCL (PF) 1 % IJ SOLN
INTRAMUSCULAR | Status: AC
  Filled 2017-04-22: qty 30

## 2017-04-22 MED ORDER — 0.9 % SODIUM CHLORIDE (POUR BTL) OPTIME
TOPICAL | Status: DC | PRN
  Administered 2017-04-22 (×2): 1000 mL

## 2017-04-22 MED ORDER — METOPROLOL TARTRATE 5 MG/5ML IV SOLN: 5.0000 mg | Freq: Four times a day (QID) | INTRAVENOUS | Status: DC | PRN

## 2017-04-22 MED ORDER — SODIUM CHLORIDE 0.9% FLUSH: 9.0000 mL | INTRAVENOUS | Status: DC | PRN

## 2017-04-22 MED ORDER — FENTANYL CITRATE (PF) 250 MCG/5ML IJ SOLN
INTRAMUSCULAR | Status: AC
  Filled 2017-04-22: qty 5

## 2017-04-22 MED ORDER — EPHEDRINE SULFATE 50 MG/ML IJ SOLN
INTRAMUSCULAR | Status: DC | PRN
  Administered 2017-04-22: 15 mg via INTRAVENOUS

## 2017-04-22 MED ORDER — ROCURONIUM BROMIDE 100 MG/10ML IV SOLN
INTRAVENOUS | Status: DC | PRN
  Administered 2017-04-22: 30 mg via INTRAVENOUS
  Administered 2017-04-22: 50 mg via INTRAVENOUS

## 2017-04-22 MED ORDER — HYDROMORPHONE HCL 1 MG/ML IJ SOLN: 0.2500 mg | INTRAMUSCULAR | Status: DC | PRN

## 2017-04-22 MED ORDER — ACETAMINOPHEN 500 MG PO TABS
ORAL_TABLET | ORAL | Status: AC
  Administered 2017-04-22: 1000 mg via ORAL
  Filled 2017-04-22: qty 2

## 2017-04-22 MED ORDER — DEXAMETHASONE SODIUM PHOSPHATE 4 MG/ML IJ SOLN
INTRAMUSCULAR | Status: AC
  Filled 2017-04-22: qty 1

## 2017-04-22 MED ORDER — PANTOPRAZOLE SODIUM 40 MG IV SOLR
40.0000 mg | Freq: Every day | INTRAVENOUS | Status: DC
  Administered 2017-04-22 – 2017-04-24 (×3): 40 mg via INTRAVENOUS
  Filled 2017-04-22 (×4): qty 40

## 2017-04-22 MED ORDER — ONDANSETRON 4 MG PO TBDP
4.0000 mg | ORAL_TABLET | Freq: Four times a day (QID) | ORAL | Status: DC | PRN
  Filled 2017-04-22: qty 1

## 2017-04-22 MED ORDER — SUGAMMADEX SODIUM 200 MG/2ML IV SOLN
INTRAVENOUS | Status: DC | PRN
  Administered 2017-04-22: 150 mg via INTRAVENOUS

## 2017-04-22 MED ORDER — PHENYLEPHRINE HCL 10 MG/ML IJ SOLN
INTRAMUSCULAR | Status: DC | PRN
  Administered 2017-04-22: 160 ug via INTRAVENOUS

## 2017-04-22 MED ORDER — PROPOFOL 10 MG/ML IV BOLUS
INTRAVENOUS | Status: DC | PRN
  Administered 2017-04-22: 100 mg via INTRAVENOUS

## 2017-04-22 MED ORDER — DEXAMETHASONE SODIUM PHOSPHATE 4 MG/ML IJ SOLN
4.0000 mg | INTRAMUSCULAR | Status: AC
  Administered 2017-04-22: 4 mg via INTRAVENOUS

## 2017-04-22 MED ORDER — DIPHENHYDRAMINE HCL 12.5 MG/5ML PO ELIX: 12.5000 mg | ORAL_SOLUTION | Freq: Four times a day (QID) | ORAL | Status: DC | PRN

## 2017-04-22 MED ORDER — METHOCARBAMOL 1000 MG/10ML IJ SOLN
500.0000 mg | Freq: Three times a day (TID) | INTRAVENOUS | Status: DC | PRN
  Administered 2017-04-25: 500 mg via INTRAVENOUS
  Filled 2017-04-22: qty 5

## 2017-04-22 MED ORDER — MIDAZOLAM HCL 2 MG/2ML IJ SOLN
INTRAMUSCULAR | Status: AC
  Filled 2017-04-22: qty 2

## 2017-04-22 MED ORDER — ACETAMINOPHEN 10 MG/ML IV SOLN
1000.0000 mg | Freq: Four times a day (QID) | INTRAVENOUS | Status: AC
  Administered 2017-04-22 – 2017-04-23 (×4): 1000 mg via INTRAVENOUS
  Filled 2017-04-22 (×4): qty 100

## 2017-04-22 MED ORDER — LIDOCAINE HCL 1 % IJ SOLN
INTRAMUSCULAR | Status: DC | PRN
  Administered 2017-04-22: 4 mL via INTRAMUSCULAR

## 2017-04-22 SURGICAL SUPPLY — 70 items
BLADE CLIPPER SURG (BLADE) ×3 IMPLANT
CANISTER SUCT 3000ML PPV (MISCELLANEOUS) ×6 IMPLANT
CATH COUDE FOLEY 5CC 14FR (CATHETERS) ×3 IMPLANT
CHLORAPREP W/TINT 26ML (MISCELLANEOUS) ×3 IMPLANT
CLIP VESOCCLUDE LG 6/CT (CLIP) IMPLANT
CLIP VESOCCLUDE MED 24/CT (CLIP) IMPLANT
CLIP VESOLOCK LG 6/CT PURPLE (CLIP) IMPLANT
CLIP VESOLOCK MED 6/CT (CLIP) IMPLANT
CLIP VESOLOCK MED LG 6/CT (CLIP) ×3 IMPLANT
COVER SURGICAL LIGHT HANDLE (MISCELLANEOUS) ×3 IMPLANT
DERMABOND ADVANCED (GAUZE/BANDAGES/DRESSINGS) ×2
DERMABOND ADVANCED .7 DNX12 (GAUZE/BANDAGES/DRESSINGS) ×1 IMPLANT
DRAIN PENROSE 1/2X36 STERILE (WOUND CARE) IMPLANT
DRAPE WARM FLUID 44X44 (DRAPE) ×3 IMPLANT
DRSG COVADERM 4X10 (GAUZE/BANDAGES/DRESSINGS) ×3 IMPLANT
DRSG TEGADERM 2-3/8X2-3/4 SM (GAUZE/BANDAGES/DRESSINGS) ×3 IMPLANT
ELECT BLADE 6.5 EXT (BLADE) ×3 IMPLANT
ELECT REM PT RETURN 9FT ADLT (ELECTROSURGICAL) ×3
ELECTRODE REM PT RTRN 9FT ADLT (ELECTROSURGICAL) ×1 IMPLANT
GAUZE SPONGE 2X2 8PLY STRL LF (GAUZE/BANDAGES/DRESSINGS) ×1 IMPLANT
GLOVE BIO SURGEON STRL SZ 6 (GLOVE) ×3 IMPLANT
GLOVE BIO SURGEON STRL SZ7 (GLOVE) ×3 IMPLANT
GLOVE BIOGEL PI IND STRL 7.0 (GLOVE) ×2 IMPLANT
GLOVE BIOGEL PI IND STRL 7.5 (GLOVE) ×1 IMPLANT
GLOVE BIOGEL PI IND STRL 8 (GLOVE) ×1 IMPLANT
GLOVE BIOGEL PI IND STRL 8.5 (GLOVE) ×1 IMPLANT
GLOVE BIOGEL PI INDICATOR 7.0 (GLOVE) ×4
GLOVE BIOGEL PI INDICATOR 7.5 (GLOVE) ×2
GLOVE BIOGEL PI INDICATOR 8 (GLOVE) ×2
GLOVE BIOGEL PI INDICATOR 8.5 (GLOVE) ×2
GLOVE ECLIPSE 7.5 STRL STRAW (GLOVE) ×3 IMPLANT
GLOVE ECLIPSE 8.0 STRL XLNG CF (GLOVE) ×3 IMPLANT
GLOVE ECLIPSE 8.5 STRL (GLOVE) ×3 IMPLANT
GLOVE INDICATOR 6.5 STRL GRN (GLOVE) ×3 IMPLANT
GOWN STRL REUS W/ TWL LRG LVL3 (GOWN DISPOSABLE) ×3 IMPLANT
GOWN STRL REUS W/ TWL XL LVL3 (GOWN DISPOSABLE) ×1 IMPLANT
GOWN STRL REUS W/TWL 2XL LVL3 (GOWN DISPOSABLE) ×6 IMPLANT
GOWN STRL REUS W/TWL LRG LVL3 (GOWN DISPOSABLE) ×6
GOWN STRL REUS W/TWL XL LVL3 (GOWN DISPOSABLE) ×2
KIT BASIN OR (CUSTOM PROCEDURE TRAY) ×3 IMPLANT
KIT ROOM TURNOVER OR (KITS) ×3 IMPLANT
L-HOOK LAP DISP 36CM (ELECTROSURGICAL) ×6
LHOOK LAP DISP 36CM (ELECTROSURGICAL) ×2 IMPLANT
NS IRRIG 1000ML POUR BTL (IV SOLUTION) ×6 IMPLANT
PAD ARMBOARD 7.5X6 YLW CONV (MISCELLANEOUS) ×3 IMPLANT
PENCIL BUTTON HOLSTER BLD 10FT (ELECTRODE) ×3 IMPLANT
RELOAD PROXIMATE 75MM GREEN (ENDOMECHANICALS) ×6 IMPLANT
SHEARS FOC LG CVD HARMONIC 17C (MISCELLANEOUS) ×3 IMPLANT
SLEEVE ENDOPATH XCEL 5M (ENDOMECHANICALS) ×3 IMPLANT
SPECIMEN JAR X LARGE (MISCELLANEOUS) ×3 IMPLANT
SPONGE GAUZE 2X2 STER 10/PKG (GAUZE/BANDAGES/DRESSINGS) ×2
SPONGE LAP 18X18 X RAY DECT (DISPOSABLE) ×3 IMPLANT
STAPLER GUN LINEAR PROX 60 (STAPLE) ×3 IMPLANT
STAPLER PROXIMATE 75MM BLUE (STAPLE) ×3 IMPLANT
STAPLER VISISTAT 35W (STAPLE) ×3 IMPLANT
SUT ETHILON 2 0 FS 18 (SUTURE) ×9 IMPLANT
SUT ETHILON 3 0 FSL (SUTURE) ×3 IMPLANT
SUT MNCRL AB 4-0 PS2 18 (SUTURE) ×3 IMPLANT
SUT PDS AB 1 TP1 96 (SUTURE) ×6 IMPLANT
SUT PDS AB 3-0 SH 27 (SUTURE) ×12 IMPLANT
SUT PDS II 0 TP-1 LOOPED 60 (SUTURE) IMPLANT
SUT SILK 2 0 SH CR/8 (SUTURE) ×3 IMPLANT
SUT SILK 2 0 TIES 10X30 (SUTURE) ×3 IMPLANT
SUT SILK 3 0 SH CR/8 (SUTURE) ×3 IMPLANT
SUT SILK 3 0 TIES 10X30 (SUTURE) ×3 IMPLANT
TRAY FOLEY W/METER SILVER 14FR (SET/KITS/TRAYS/PACK) ×3 IMPLANT
TRAY LAPAROSCOPIC MC (CUSTOM PROCEDURE TRAY) ×3 IMPLANT
TROCAR XCEL NON-BLD 5MMX100MML (ENDOMECHANICALS) ×3 IMPLANT
TUBING INSUFFLATION (TUBING) ×3 IMPLANT
YANKAUER SUCT BULB TIP NO VENT (SUCTIONS) ×3 IMPLANT

## 2017-04-22 NOTE — Op Note (Signed)
PRE-OPERATIVE DIAGNOSIS: adenocarcinoma of the gastric antrum  POST-OPERATIVE DIAGNOSIS: Same  PROCEDURE: Procedure(s): Diagnostic laparoscopy, distal gastrectomy with billroth II reconstruction, 16 Fr jejunostomy tube placement  SURGEON: Surgeon(s): Stark Klein, MD  Assistant:  Neysa Bonito, MD and Judyann Munson, RNFA  ANESTHESIA: general and  epidural  DRAINS: jejunostomy tube 16 Fr  LOCAL MEDICATIONS USED: LIDOCAINE   SPECIMEN: Source of Specimen: 1.  Liver nodule (frozen section negative) 2.  Portal lymph nodes 3.  Common hepatic artery lymph nodes. 4.  gastric antrum with omentum  DISPOSITION OF SPECIMEN: PATHOLOGY  COUNTS: YES  DICTATION: .Dragon Dictation  PLAN OF CARE: admit to stepdown  PATIENT DISPOSITION: PACU - hemodynamically stable.  FINDINGS: firm mass along lesser curve at incisura  EBL: 100 mL  PROCEDURE:  Patient was identified in the holding area and taken to the operating room where (s)he was placed supine on operating room table. An epidural was placed preoperatively. A Foley catheter was placed. General anesthesia was induced. The abdomen was prepped and draped in sterile fashion. A timeout was performed according to the surgical safety checklist. When all was correct, we continued.  The patient was placed into reverse trendeleberg position and rotated to the right.  A 5 mm optiview trocar was placed under direct visualization.  A second 5 mm trocar was placed in the midline.  No evidence of carcinomatosis was seen.    An upper midline incision was then made with the #10 blade.  The subcutaneous tissues were divided with the cautery and the fascia entered in the midline.  The peritoneum and liver surfaces were palpated and no evidence of metastases was found.    The omentum was freed up from the colon and the colon was tucked out of the way.  A few of the short gastrics were taken down.  The gastric mass was  identified and the omentum freed up in order to create a site for resecting the stomach.  Green loads of the 75 mm GIA were used.  The TA stapler was used to divide just past the pylorus.  Both staple lines were oversewn with 3-0 PDS suture, inverting the staple lines.    The small bowel was run to identify an appropriate place for gastrojejunostomy.    This was placed in the antecolic location.  A 3-0 silk was placed proximally and distally at the site of the gastrojejunostomy. The jejunum and the stomach were opened. A 75 mm GIA stapler was used to create a side-to-side functional end-to-end anastomosis. The NGT tube was then advanced through the stomach and placed into the efferent limb of the jejunum. The pursestring suture was tied down.  Two 3-0 PDS were used to close the anastomotic defect in a running [place] fashion.     A site for the jejunostomy tube was identified.  A 2-0 silk pursestring was placed.  The jejunostomy tube was advanced through the abdominal wall and inserted into the jejunum.  The pursestring was tied down.  The tube was witzeled with 3-0 silk sutures.  The tube was then pexed to the abdominal wall with 2-0 silks.  The balloon of the tube was inflated with 1 mL saline to avoid obstructing the bowel.  The flange of the jejunostomy tube was secured with interrupted 2-0 nylon sutures.    The abdomen was irrigated and the abdomen was examined to make sure all the laps were removed.  A lap count was performed and was correct as well.  The fascia was then  closed using running #1 looped PDS suture. The skin was then irrigated and closed with skin staples. Soft sterile dressings were applied. Needle, sponge, and instrument count were correct 2. The patient was allowed to emerge from anesthesia and taken to the PACU in stable condition

## 2017-04-22 NOTE — Progress Notes (Signed)
Received from OR with Ropivacaine Epidural at 62ml/hr.

## 2017-04-22 NOTE — Interval H&P Note (Signed)
History and Physical Interval Note:  04/22/2017 11:26 AM  Gabriel Poole  has presented today for surgery, with the diagnosis of gastric cancer  The various methods of treatment have been discussed with the patient and family. After consideration of risks, benefits and other options for treatment, the patient has consented to  Procedure(s) with comments: [place] (N/A) - EPIDURAL PARTIAL GASTRECTOMY VS SUBTOTAL GASTRECTOMY (N/A) FEEDING TUBE PLACEMENT (N/A) as a surgical intervention .  The patient's history has been reviewed, patient examined, no change in status, stable for surgery.  I have reviewed the patient's chart and labs.  Questions were answered to the patient's satisfaction.     Stark Klein

## 2017-04-22 NOTE — Progress Notes (Signed)
Pt arrived to unit. Some blood noted around epidural insertion site. Abdomen dressing with old drainage marked. J-tube site clean and dry. No c/o pain, PCA currently infusing. Pt given swab. Pt educated multiple times that he is not allowed to eat or drink anything throughout the night. And that all current tubes must stay in place until at least the am. Family at bedside. Call bell and phone within reach. Will continue to monitor.

## 2017-04-22 NOTE — Transfer of Care (Signed)
Immediate Anesthesia Transfer of Care Note  Patient: Gabriel Poole  Procedure(s) Performed: LAPAROSCOPY DIAGNOSTIC ERAS PATHWAY (N/A Abdomen) PARTIAL GASTRECTOMY (N/A Abdomen) JEJUNAL FEEDING TUBE PLACEMENT (N/A Abdomen)  Patient Location: PACU  Anesthesia Type:GA combined with regional for post-op pain  Level of Consciousness: drowsy and responds to stimulation  Airway & Oxygen Therapy: Patient Spontanous Breathing  Post-op Assessment: Report given to RN and Post -op Vital signs reviewed and stable  Post vital signs: Reviewed and stable  Last Vitals:  Vitals:   04/22/17 1110  BP: (!) 104/98  Pulse: (!) 59  Resp: 20  Temp: (!) 36.4 C  SpO2: 100%    Last Pain:  Vitals:   04/22/17 1110  TempSrc: Oral         Complications: No apparent anesthesia complications

## 2017-04-22 NOTE — Anesthesia Procedure Notes (Signed)
Procedure Name: Intubation Date/Time: 04/22/2017 12:18 PM Performed by: White, Amedeo Plenty, CRNA Pre-anesthesia Checklist: Patient identified, Emergency Drugs available, Suction available and Patient being monitored Patient Re-evaluated:Patient Re-evaluated prior to induction Oxygen Delivery Method: Circle System Utilized Preoxygenation: Pre-oxygenation with 100% oxygen Induction Type: IV induction Ventilation: Mask ventilation without difficulty Laryngoscope Size: Mac and 4 Grade View: Grade I Tube type: Oral Tube size: 7.5 mm Number of attempts: 1 Airway Equipment and Method: Stylet Placement Confirmation: ETT inserted through vocal cords under direct vision,  positive ETCO2 and breath sounds checked- equal and bilateral Secured at: 23 cm Tube secured with: Tape Dental Injury: Teeth and Oropharynx as per pre-operative assessment

## 2017-04-22 NOTE — Anesthesia Preprocedure Evaluation (Addendum)
Anesthesia Evaluation  Patient identified by MRN, date of birth, ID band Patient awake    Reviewed: Allergy & Precautions, H&P , NPO status , Patient's Chart, lab work & pertinent test results, reviewed documented beta blocker date and time   Airway Mallampati: II  TM Distance: >3 FB Neck ROM: Full    Dental no notable dental hx. (+) Partial Upper, Dental Advisory Given   Pulmonary neg pulmonary ROS, former smoker,    Pulmonary exam normal breath sounds clear to auscultation       Cardiovascular hypertension, Pt. on medications and Pt. on home beta blockers  Rhythm:Regular Rate:Normal     Neuro/Psych negative neurological ROS  negative psych ROS   GI/Hepatic negative GI ROS, Neg liver ROS, Gastric CA   Endo/Other  negative endocrine ROS  Renal/GU negative Renal ROS  negative genitourinary   Musculoskeletal  (+) Arthritis ,   Abdominal   Peds  Hematology negative hematology ROS (+)   Anesthesia Other Findings   Reproductive/Obstetrics negative OB ROS                            Anesthesia Physical Anesthesia Plan  ASA: III  Anesthesia Plan: General and Epidural   Post-op Pain Management: GA combined w/ Regional for post-op pain   Induction: Intravenous  PONV Risk Score and Plan: 3 and Ondansetron, Dexamethasone and Midazolam  Airway Management Planned:   Additional Equipment:   Intra-op Plan:   Post-operative Plan: Extubation in OR  Informed Consent: I have reviewed the patients History and Physical, chart, labs and discussed the procedure including the risks, benefits and alternatives for the proposed anesthesia with the patient or authorized representative who has indicated his/her understanding and acceptance.   Dental advisory given  Plan Discussed with: CRNA  Anesthesia Plan Comments:        Anesthesia Quick Evaluation

## 2017-04-22 NOTE — Progress Notes (Signed)
Patient could not confirm if he took his Metoprolol this morning.  He said that he was not sure

## 2017-04-22 NOTE — Anesthesia Procedure Notes (Signed)
Epidural Patient location during procedure: pre-op Start time: 04/22/2017 11:44 AM End time: 04/22/2017 11:59 AM  Staffing Anesthesiologist: Roderic Palau, MD Performed: anesthesiologist   Preanesthetic Checklist Completed: patient identified, site marked, surgical consent, pre-op evaluation, timeout performed, IV checked, risks and benefits discussed and monitors and equipment checked  Epidural Patient position: sitting Prep: ChloraPrep, site prepped and draped and DuraPrep Patient monitoring: continuous pulse ox, blood pressure, heart rate and cardiac monitor Approach: midline Location: thoracic (1-12) Injection technique: LOR air  Needle:  Needle type: Tuohy  Needle gauge: 17 G Needle length: 9 cm and 9 Needle insertion depth: 6 cm Catheter type: closed end flexible Catheter size: 19 Gauge Catheter at skin depth: 11 cm Test dose: negative and 1.5% lidocaine with Epi 1:200 K  Assessment Sensory level: T10

## 2017-04-23 ENCOUNTER — Encounter (HOSPITAL_COMMUNITY): Payer: Self-pay | Admitting: General Surgery

## 2017-04-23 LAB — BASIC METABOLIC PANEL
Anion gap: 8 (ref 5–15)
BUN: 18 mg/dL (ref 6–20)
CALCIUM: 9 mg/dL (ref 8.9–10.3)
CHLORIDE: 104 mmol/L (ref 101–111)
CO2: 23 mmol/L (ref 22–32)
CREATININE: 1.21 mg/dL (ref 0.61–1.24)
GFR, EST NON AFRICAN AMERICAN: 59 mL/min — AB (ref >60)
Glucose, Bld: 162 mg/dL — ABNORMAL HIGH (ref 65–99)
Potassium: 4.4 mmol/L (ref 3.5–5.1)
SODIUM: 135 mmol/L (ref 135–145)

## 2017-04-23 LAB — CBC
HCT: 38.6 % — ABNORMAL LOW (ref 39.0–52.0)
HEMOGLOBIN: 12.4 g/dL — AB (ref 13.0–17.0)
MCH: 29.8 pg (ref 26.0–34.0)
MCHC: 32.1 g/dL (ref 30.0–36.0)
MCV: 92.8 fL (ref 78.0–100.0)
PLATELETS: 233 10*3/uL (ref 150–400)
RBC: 4.16 MIL/uL — ABNORMAL LOW (ref 4.22–5.81)
RDW: 13.2 % (ref 11.5–15.5)
WBC: 21.6 10*3/uL — ABNORMAL HIGH (ref 4.0–10.5)

## 2017-04-23 LAB — MAGNESIUM: MAGNESIUM: 1.6 mg/dL — AB (ref 1.7–2.4)

## 2017-04-23 LAB — PROTIME-INR
INR: 1.1
Prothrombin Time: 14.1 seconds (ref 11.4–15.2)

## 2017-04-23 LAB — PHOSPHORUS: PHOSPHORUS: 3.4 mg/dL (ref 2.5–4.6)

## 2017-04-23 MED ORDER — SODIUM CHLORIDE 0.9 % IV BOLUS (SEPSIS)
500.0000 mL | Freq: Once | INTRAVENOUS | Status: AC
  Administered 2017-04-23: 500 mL via INTRAVENOUS

## 2017-04-23 NOTE — Anesthesia Post-op Follow-up Note (Signed)
  Anesthesia Pain Follow-up Note  Patient: Gabriel Poole  Day #: 1  Date of Follow-up: 04/23/2017 Time: 10:36 AM  Last Vitals:  Vitals:   04/23/17 0735 04/23/17 0928  BP: 129/66   Pulse: (!) 59   Resp: 10 (!) 9  Temp: 36.8 C   SpO2: 97% 98%    Level of Consciousness: alert  Pain: none   Side Effects:None  Catheter Site Exam:clean, dry, no drainage  Epidural / Intrathecal (From admission, onward)   Start     Dose/Rate Route Frequency Ordered Stop   04/22/17 1900  ropivacaine (PF) 2 mg/mL (0.2%) (NAROPIN) injection     5 mL/hr 5 mL/hr  Epidural Continuous 04/22/17 1825         Plan: Continue current therapy of postop epidural at surgeon's request  Yelina Sarratt,W. EDMOND

## 2017-04-23 NOTE — Progress Notes (Signed)
Initial Nutrition Assessment  DOCUMENTATION CODES:   Non-severe (moderate) malnutrition in context of chronic illness  INTERVENTION:   Recommend: -  Osmolite 1.5 with a goal rate of 55 ml/hr (1320 ml/day) -  30 ml Prostat BID  Provides: 2180 kcal, 119 grams protein, and 1005 ml free water.   NUTRITION DIAGNOSIS:   Moderate Malnutrition related to chronic illness(gastric cancer) as evidenced by moderate muscle depletion, moderate fat depletion.  GOAL:   Patient will meet greater than or equal to 90% of their needs  MONITOR:   Diet advancement, TF tolerance  REASON FOR ASSESSMENT:   Consult (TF recommendations)  ASSESSMENT:   Pt with PMH of HTN, HLD admitted with dx of gastric cancer now s/p distal gastrectomy with billroth II reconstruction and 62 F J tube placement 12/13.    Pt reports that he began losing weight 05/2015. His usual weight is 190 lb. He got down to as low as 152 lb but has been able to gain weight and is back up to 162 lb at this admission. He states weight loss was due to poor appetite. He works at [employer] and could no longer eat there due to the smell of food causing nausea. He was able to eat at home and this has improved. At home his intake is much better. He cooks at home.  Pt states he is hungry and thirsty and is anxious to start a diet soon. Explained role of tubes for nutrition and decompression. Questions answered.   Medications reviewed  Labs reviewed: magnesium 1.6 (L)    NUTRITION - FOCUSED PHYSICAL EXAM:    Most Recent Value  Orbital Region  Mild depletion  Upper Arm Region  Moderate depletion  Thoracic and Lumbar Region  No depletion  Buccal Region  Mild depletion  Temple Region  Moderate depletion  Clavicle Bone Region  Moderate depletion  Clavicle and Acromion Bone Region  No depletion  Scapular Bone Region  No depletion  Dorsal Hand  Mild depletion  Patellar Region  No depletion  Anterior Thigh Region  No depletion  Posterior  Calf Region  No depletion  Edema (RD Assessment)  None  Hair  Reviewed  Eyes  Reviewed  Mouth  Reviewed  Skin  Reviewed  Nails  Reviewed       Diet Order:  Diet NPO time specified  EDUCATION NEEDS:   Education needs have been addressed  Skin:  Skin Assessment: (abd incision)  Last BM:  12/12  Height:   Ht Readings from Last 1 Encounters:  04/16/17 5\' 10"  (1.778 m)    Weight:   Wt Readings from Last 1 Encounters:  04/23/17 174 lb 2.6 oz (79 kg)    Ideal Body Weight:  75.4 kg  BMI:  Body mass index is 24.99 kg/m.  Estimated Nutritional Needs:   Kcal:  2050-2300  Protein:  110-120 grams  Fluid:  > 2 L/day  Maylon Peppers RD, LDN, CNSC [phone] Pager [phone] After Hours Pager'

## 2017-04-23 NOTE — Progress Notes (Signed)
1 Day Post-Op   Subjective/Chief Complaint: Doing well.  States that he passed gas last night and this AM.  Denies nausea or pain.     Objective: Vital signs in last 24 hours: Temp:  [97.5 F (36.4 C)-98.6 F (37 C)] 98.2 F (36.8 C) (12/14 0735) Pulse Rate:  [57-75] 59 (12/14 0735) Resp:  [10-20] 10 (12/14 0735) BP: (104-139)/(56-98) 129/66 (12/14 0735) SpO2:  [95 %-100 %] 97 % (12/14 0735) Weight:  [73.5 kg (162 lb)-79 kg (174 lb 2.6 oz)] 79 kg (174 lb 2.6 oz) (12/14 1093) Last BM Date: 04/21/17  Intake/Output from previous day: 12/13 0701 - 12/14 0700 In: 1400 [I.V.:1200] Out: 405 [Urine:305; Blood:100] Intake/Output this shift: Total I/O In: -  Out: 320 [Urine:320]  General appearance: alert, cooperative and no distress Resp: breathing comfortably Cardio: regular rate and rhythm GI: soft, non distended, dressing c/d/i.  NGT bilious Extremities: extremities normal, atraumatic, no cyanosis or edema  Lab Results:  Recent Labs    04/23/17 0211  WBC 21.6*  HGB 12.4*  HCT 38.6*  PLT 233   BMET Recent Labs    04/23/17 0211  NA 135  K 4.4  CL 104  CO2 23  GLUCOSE 162*  BUN 18  CREATININE 1.21  CALCIUM 9.0   PT/INR Recent Labs    04/23/17 0211  LABPROT 14.1  INR 1.10   ABG No results for input(s): PHART, HCO3 in the last 72 hours.  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 Indeterminate hyperenhancing liver dome lesion on chest CT staging study. EXAM: MRI ABDOMEN WITHOUT AND WITH CONTRAST TECHNIQUE: Multiplanar multisequence MR imaging of the abdomen was performed both before and after the administration of intravenous contrast. CONTRAST:  14mL MULTIHANCE GADOBENATE DIMEGLUMINE 529 MG/ML IV SOLN COMPARISON:  04/05/2017 chest CT.  02/25/2017 CT abdomen/ pelvis. FINDINGS: Lower chest: No acute abnormality at the lung bases .  Hepatobiliary: Normal liver size and configuration. No hepatic steatosis. There is a mildly T2 hyperintense 0.9 cm liver dome mass (series 3/image 3), which demonstrates arterial phase hyperenhancement and persistent slight hyperenhancement on the portal venous and delayed phase postcontrast sequences, stable back to 02/25/2017 CT abdomen study. There is a subcentimeter focus of arterial phase hyperenhancement in the lateral segment left liver lobe (series 1001/ image 26), which is occult on all other sequences, suggestive of a benign transient hepatic attenuation difference. There are scattered simple subcentimeter liver cysts in the inferior right liver lobe and lateral segment left liver lobe. No additional liver lesions. Normal gallbladder with no cholelithiasis. No biliary ductal dilatation. Common bile duct diameter 4 mm. No choledocholithiasis. Pancreas: No pancreatic mass or duct dilation.  No pancreas divisum. Spleen: Normal size. No mass. Adrenals/Urinary Tract: There is a 1.2 cm right adrenal nodule (series 9/ image 26) with significant signal intensity loss on out of phase chemical shift imaging compatible with a benign right adrenal adenoma. No discrete left adrenal nodules. No hydronephrosis. Multiple simple renal cysts bilaterally, largest 2.5 cm in the posterior lower right kidney and 5.2 cm in the posterior lower left kidney. No suspicious renal masses. Stomach/Bowel: There is an enhancing 2.9 x 2.4 cm mass along the lesser curvature in the gastric antrum (series 1005/ image 37). Stomach is mildly distended with fluid and debris. Visualized small and large bowel is normal caliber, with no bowel wall thickening. Vascular/Lymphatic: Atherosclerotic nonaneurysmal abdominal aorta. Patent portal, splenic, hepatic and  renal veins. Mildly enlarged 1.0 cm node in the gastrohepatic ligament (series 1003/ image 41). Other: No abdominal ascites or focal fluid collection. Musculoskeletal: No aggressive  appearing focal osseous lesions. 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 IMPRESSION: 1. Enhancing 2.9 cm mass along the lesser curvature in the gastric antrum, compatible with known gastric malignancy . 2. Mildly enlarged gastrohepatic ligament lymph node, cannot exclude nodal metastasis. 3. No definite liver metastatic disease. Hyperenhancing 0.9 cm liver dome mass remains inconclusive, although the MRI features are most suggestive of a flash filling hemangioma, and the mass has been stable for nearly 2 months. Additional subcentimeter focus of hyperenhancement in the lateral segment left liver lobe is most likely a benign transient vascular phenomenon. Recommend attention to these lesions on a follow-up MRI abdomen without and with IV contrast in 3-6 months. This recommendation follows ACR consensus guidelines: Management of Incidental Liver Lesions on CT: A White Paper of the ACR Incidental Findings Committee. J Am Coll Radiol 2017; 18:5631-4970. 4. Benign right adrenal adenoma. Electronically Signed   By: Ilona Sorrel M.D.   On: 04/22/2017 08:23    Anti-infectives: Anti-infectives (From admission, onward)   Start     Dose/Rate Route Frequency Ordered Stop   04/22/17 1900  ciprofloxacin (CIPRO) IVPB 400 mg     400 mg 200 mL/hr over 60 Minutes Intravenous Every 12 hours 04/22/17 1825 04/22/17 2045   04/22/17 1118  ciprofloxacin (CIPRO) 400 MG/200ML IVPB    Comments:  Hazlip, Jessica   : cabinet override      04/22/17 1118 04/22/17 1230   04/22/17 1112  ciprofloxacin (CIPRO) IVPB 400 mg     400 mg 200 mL/hr over 60 Minutes Intravenous On call to O.R. 04/22/17 1112 04/22/17 1230      Assessment/Plan: s/p Procedure(s) with comments: LAPAROSCOPY DIAGNOSTIC ERAS PATHWAY (N/A) - EPIDURAL PARTIAL  GASTRECTOMY (N/A) JEJUNAL FEEDING TUBE PLACEMENT (N/A) epidural for pain control  Keep foley for marginal UOP.  Give bolus IV antihypertensives.   Ambulate Await pathology.    LOS: 2 days    Stark Klein 04/23/2017

## 2017-04-24 LAB — BASIC METABOLIC PANEL
Anion gap: 5 (ref 5–15)
BUN: 14 mg/dL (ref 6–20)
CO2: 26 mmol/L (ref 22–32)
Calcium: 8.7 mg/dL — ABNORMAL LOW (ref 8.9–10.3)
Chloride: 102 mmol/L (ref 101–111)
Creatinine, Ser: 1.1 mg/dL (ref 0.61–1.24)
GFR calc Af Amer: >60 mL/min (ref >60)
Glucose, Bld: 114 mg/dL — ABNORMAL HIGH (ref 65–99)
POTASSIUM: 3.8 mmol/L (ref 3.5–5.1)
SODIUM: 133 mmol/L — AB (ref 135–145)

## 2017-04-24 LAB — CBC
HCT: 34.5 % — ABNORMAL LOW (ref 39.0–52.0)
Hemoglobin: 11 g/dL — ABNORMAL LOW (ref 13.0–17.0)
MCH: 29.6 pg (ref 26.0–34.0)
MCHC: 31.9 g/dL (ref 30.0–36.0)
MCV: 93 fL (ref 78.0–100.0)
PLATELETS: 204 10*3/uL (ref 150–400)
RBC: 3.71 MIL/uL — AB (ref 4.22–5.81)
RDW: 13.4 % (ref 11.5–15.5)
WBC: 19 10*3/uL — AB (ref 4.0–10.5)

## 2017-04-24 LAB — GLUCOSE, CAPILLARY: GLUCOSE-CAPILLARY: 96 mg/dL (ref 65–99)

## 2017-04-24 MED ORDER — OSMOLITE 1.5 CAL PO LIQD
1000.0000 mL | ORAL | Status: DC
  Administered 2017-04-24: 1000 mL
  Filled 2017-04-24 (×2): qty 1000

## 2017-04-24 MED ORDER — MAGNESIUM SULFATE 2 GM/50ML IV SOLN
2.0000 g | Freq: Once | INTRAVENOUS | Status: AC
  Administered 2017-04-24: 2 g via INTRAVENOUS
  Filled 2017-04-24: qty 50

## 2017-04-24 MED ORDER — CHLORHEXIDINE GLUCONATE 0.12 % MT SOLN
15.0000 mL | Freq: Two times a day (BID) | OROMUCOSAL | Status: DC
  Administered 2017-04-24 – 2017-04-28 (×9): 15 mL via OROMUCOSAL
  Filled 2017-04-24 (×8): qty 15

## 2017-04-24 MED ORDER — ORAL CARE MOUTH RINSE
15.0000 mL | Freq: Two times a day (BID) | OROMUCOSAL | Status: DC
  Administered 2017-04-24 – 2017-04-25 (×3): 15 mL via OROMUCOSAL

## 2017-04-24 NOTE — Progress Notes (Signed)
40mL morphine PCA wasted in sink w/ Sherri Rad, RN witnessing  Jaymes Graff, RN

## 2017-04-24 NOTE — Progress Notes (Addendum)
2 Days Post-Op   Subjective/Chief Complaint: Says he is a little sore.  Using PCA.  Says he has flatus, but no BM.    Objective: Vital signs in last 24 hours: Temp:  [98.3 F (36.8 C)-98.4 F (36.9 C)] 98.4 F (36.9 C) (12/15 0836) Pulse Rate:  [61-89] 89 (12/15 0836) Resp:  [9-19] 15 (12/15 0836) BP: (125-142)/(70-87) 136/70 (12/15 0836) SpO2:  [92 %-99 %] 93 % (12/15 0404) Last BM Date: 04/21/17  Intake/Output from previous day: 12/14 0701 - 12/15 0700 In: 2233.4 [I.V.:2035.3; NG/GT:20] Out: 2060 [Urine:1880; Emesis/NG output:180] Intake/Output this shift: No intake/output data recorded.  General appearance: alert, cooperative and no distress Resp: breathing comfortably Cardio: regular rate and rhythm GI: soft, sl distended, dressing c/d/i.  NGT bilious Extremities: extremities normal, atraumatic, no cyanosis or edema  Lab Results:  Recent Labs    04/23/17 0211 04/24/17 0324  WBC 21.6* 19.0*  HGB 12.4* 11.0*  HCT 38.6* 34.5*  PLT 233 204   BMET Recent Labs    04/23/17 0211 04/24/17 0324  NA 135 133*  K 4.4 3.8  CL 104 102  CO2 23 26  GLUCOSE 162* 114*  BUN 18 14  CREATININE 1.21 1.10  CALCIUM 9.0 8.7*   PT/INR Recent Labs    04/23/17 0211  LABPROT 14.1  INR 1.10   ABG No results for input(s): PHART, HCO3 in the last 72 hours.  Invalid input(s): PCO2, PO2  Studies/Results: No results found.  Anti-infectives: Anti-infectives (From admission, onward)   Start     Dose/Rate Route Frequency Ordered Stop   04/22/17 1900  ciprofloxacin (CIPRO) IVPB 400 mg     400 mg 200 mL/hr over 60 Minutes Intravenous Every 12 hours 04/22/17 1825 04/22/17 2045   04/22/17 1118  ciprofloxacin (CIPRO) 400 MG/200ML IVPB    Comments:  Hazlip, Jessica   : cabinet override      04/22/17 1118 04/22/17 1230   04/22/17 1112  ciprofloxacin (CIPRO) IVPB 400 mg     400 mg 200 mL/hr over 60 Minutes Intravenous On call to O.R. 04/22/17 1112 04/22/17 1230       Assessment/Plan: s/p Procedure(s) with comments: LAPAROSCOPY DIAGNOSTIC ERAS PATHWAY (N/A) - EPIDURAL PARTIAL GASTRECTOMY (N/A) JEJUNAL FEEDING TUBE PLACEMENT (N/A)   epidural for pain control with PCA.   Oliguria improved.   IV antihypertensives.   Ambulate Gastric cancer. Await pathology.   Start trophic feeds today. Plan d/c NGT tomorrow as long as no significant nausea. ABL anemia - asymptomatic.   Hypomagnesemia- repleted.   Hyponatremia - decrease IV fluids.   Moderate protein calorie malnutrition. Tube feeds.     LOS: 3 days    Gabriel Poole 04/24/2017

## 2017-04-25 ENCOUNTER — Inpatient Hospital Stay (HOSPITAL_COMMUNITY): Payer: Medicare HMO | Admitting: Anesthesiology

## 2017-04-25 LAB — CBC
HCT: 34.8 % — ABNORMAL LOW (ref 39.0–52.0)
HEMOGLOBIN: 11.4 g/dL — AB (ref 13.0–17.0)
MCH: 30.1 pg (ref 26.0–34.0)
MCHC: 32.8 g/dL (ref 30.0–36.0)
MCV: 91.8 fL (ref 78.0–100.0)
Platelets: 216 10*3/uL (ref 150–400)
RBC: 3.79 MIL/uL — ABNORMAL LOW (ref 4.22–5.81)
RDW: 13.3 % (ref 11.5–15.5)
WBC: 16.2 10*3/uL — AB (ref 4.0–10.5)

## 2017-04-25 LAB — BASIC METABOLIC PANEL
ANION GAP: 8 (ref 5–15)
BUN: 11 mg/dL (ref 6–20)
CALCIUM: 8.8 mg/dL — AB (ref 8.9–10.3)
CHLORIDE: 100 mmol/L — AB (ref 101–111)
CO2: 25 mmol/L (ref 22–32)
CREATININE: 1.03 mg/dL (ref 0.61–1.24)
GFR calc non Af Amer: >60 mL/min (ref >60)
Glucose, Bld: 106 mg/dL — ABNORMAL HIGH (ref 65–99)
Potassium: 3.7 mmol/L (ref 3.5–5.1)
SODIUM: 133 mmol/L — AB (ref 135–145)

## 2017-04-25 MED ORDER — METOPROLOL TARTRATE 50 MG PO TABS
50.0000 mg | ORAL_TABLET | Freq: Two times a day (BID) | ORAL | Status: DC
  Administered 2017-04-25 – 2017-04-28 (×7): 50 mg via ORAL
  Filled 2017-04-25 (×7): qty 1

## 2017-04-25 MED ORDER — PANTOPRAZOLE SODIUM 40 MG PO TBEC
40.0000 mg | DELAYED_RELEASE_TABLET | Freq: Every day | ORAL | Status: DC
  Administered 2017-04-25 – 2017-04-27 (×3): 40 mg via ORAL
  Filled 2017-04-25 (×3): qty 1

## 2017-04-25 MED ORDER — AMLODIPINE BESYLATE 10 MG PO TABS
10.0000 mg | ORAL_TABLET | Freq: Every day | ORAL | Status: DC
  Administered 2017-04-25 – 2017-04-28 (×4): 10 mg via ORAL
  Filled 2017-04-25 (×4): qty 1

## 2017-04-25 MED ORDER — OSMOLITE 1.5 CAL PO LIQD
1000.0000 mL | ORAL | Status: DC
  Administered 2017-04-25 – 2017-04-26 (×2): 1000 mL
  Filled 2017-04-25 (×3): qty 1000

## 2017-04-25 NOTE — Anesthesia Post-op Follow-up Note (Signed)
  Anesthesia Pain Follow-up Note  Patient: Gabriel Poole  Day #: 3  Date of Follow-up: 04/25/2017 Time: 3:41 PM  Last Vitals:  Vitals:   04/25/17 1044 04/25/17 1158  BP: 139/75   Pulse: 82   Resp: 15 16  Temp: 36.9 C   SpO2: 95% 95%    Level of Consciousness: Alert  Pain: Controlled  Side Effects:none  Catheter Site Exam:OK  Epidural / Intrathecal (From admission, onward)   Start     Dose/Rate Route Frequency Ordered Stop   04/22/17 1900  ropivacaine (PF) 2 mg/mL (0.2%) (NAROPIN) injection     5 mL/hr 5 mL/hr  Epidural Continuous 04/22/17 1825         Plan: Epidural catheter removed, tip intact, site OK  Denny Mccree DAVID

## 2017-04-25 NOTE — Progress Notes (Signed)
Epidural catheter and foley catheter removed, per order. Pt tolerated well.   Grant Fontana BSN, RN

## 2017-04-25 NOTE — Progress Notes (Signed)
NG tube removed, per order. Pt tolerated well. Small amount of ice chips given, per order.    Grant Fontana BSN, RN

## 2017-04-25 NOTE — Progress Notes (Signed)
Pt refused to ambulate at this time. Will offer again and continue current plan of care.   Grant Fontana BSN, RN

## 2017-04-25 NOTE — Anesthesia Post-op Follow-up Note (Addendum)
  Anesthesia Pain Follow-up Note  Patient: Gabriel Poole  Day #: 2  Date of Follow-up: 04/25/2017 Time: 12:00 AM  Last Vitals:  Vitals:   04/24/17 1938 04/24/17 2000  BP:  (!) 145/75  Pulse:  70  Resp: 19 19  Temp:  36.4 C  SpO2: 94%     Level of Consciousness: alert  Pain: mild   Side Effects:None  Catheter Site Exam:clean  Epidural / Intrathecal (From admission, onward)   Start     Dose/Rate Route Frequency Ordered Stop   04/22/17 1900  ropivacaine (PF) 2 mg/mL (0.2%) (NAROPIN) injection     5 mL/hr 5 mL/hr  Epidural Continuous 04/22/17 1825         Plan: Continue current therapy of postop epidural at surgeon's request  Lorel Lembo

## 2017-04-25 NOTE — Progress Notes (Signed)
Morphine PCA syringe replaced. 11mL from old morphine syringe wasted in sink and witnessed by charge RN, Ronalee Belts.   Grant Fontana BSN, RN

## 2017-04-25 NOTE — Progress Notes (Signed)
Patient ID: Gabriel Poole, male   DOB: 10/28/1945, 71 y.o.   MRN: [id number]  [facility] Surgery Progress Note  3 Days Post-Op  Subjective: CC-  Patient states that he feels well. Denies any abdominal pain. Reports passing small amount of flatus. Tolerating tube feeds at 69mL/hr without nausea or abdominal distension. Pulling about 1100 on IS.  Objective: Vital signs in last 24 hours: Temp:  [96.9 F (36.1 C)-99.8 F (37.7 C)] 96.9 F (36.1 C) (12/16 0400) Pulse Rate:  [69-96] 96 (12/16 0400) Resp:  [10-28] 14 (12/16 0830) BP: (131-147)/(46-78) 143/78 (12/16 0400) SpO2:  [92 %-94 %] 92 % (12/16 0830) Last BM Date: 04/21/17  Intake/Output from previous day: 12/15 0701 - 12/16 0700 In: 1478 [I.V.:1349] Out: 2520 [Urine:2200; Emesis/NG output:320] Intake/Output this shift: No intake/output data recorded.  PE: Gen:  Alert, NAD, pleasant HEENT: EOM's intact, pupils equal and round Card:  RRR, no M/G/R heard Pulm:  CTAB, no W/R/R, effort normal Abd: Soft, mild distension, +BS, nontender, dressing with trace dried blood - NGT with 320cc/24hr Psych: A&Ox3  Skin: no rashes noted, warm and dry  Lab Results:  Recent Labs    04/24/17 0324 04/25/17 0236  WBC 19.0* 16.2*  HGB 11.0* 11.4*  HCT 34.5* 34.8*  PLT 204 216   BMET Recent Labs    04/24/17 0324 04/25/17 0236  NA 133* 133*  K 3.8 3.7  CL 102 100*  CO2 26 25  GLUCOSE 114* 106*  BUN 14 11  CREATININE 1.10 1.03  CALCIUM 8.7* 8.8*   PT/INR Recent Labs    04/23/17 0211  LABPROT 14.1  INR 1.10   CMP     Component Value Date/Time   NA 133 (L) 04/25/2017 0236   NA 145 (H) 05/31/2015 1222   K 3.7 04/25/2017 0236   CL 100 (L) 04/25/2017 0236   CO2 25 04/25/2017 0236   GLUCOSE 106 (H) 04/25/2017 0236   BUN 11 04/25/2017 0236   BUN 11 05/31/2015 1222   CREATININE 1.03 04/25/2017 0236   CREATININE 1.18 11/15/2012 0934   CALCIUM 8.8 (L) 04/25/2017 0236   PROT 6.9 04/16/2017 1130   PROT 7.0  05/31/2015 1222   ALBUMIN 4.0 04/16/2017 1130   ALBUMIN 4.6 05/31/2015 1222   AST 29 04/16/2017 1130   ALT 21 04/16/2017 1130   ALKPHOS 95 04/16/2017 1130   BILITOT 1.1 04/16/2017 1130   BILITOT 0.5 05/31/2015 1222   GFRNONAA >60 04/25/2017 0236   GFRNONAA 64 11/15/2012 0934   GFRAA >60 04/25/2017 0236   GFRAA 74 11/15/2012 0934   Lipase  No results found for: LIPASE     Studies/Results: No results found.  Anti-infectives: Anti-infectives (From admission, onward)   Start     Dose/Rate Route Frequency Ordered Stop   04/22/17 1900  ciprofloxacin (CIPRO) IVPB 400 mg     400 mg 200 mL/hr over 60 Minutes Intravenous Every 12 hours 04/22/17 1825 04/22/17 2045   04/22/17 1118  ciprofloxacin (CIPRO) 400 MG/200ML IVPB    Comments:  Hazlip, Jessica   : cabinet override      04/22/17 1118 04/22/17 1230   04/22/17 1112  ciprofloxacin (CIPRO) IVPB 400 mg     400 mg 200 mL/hr over 60 Minutes Intravenous On call to O.R. 04/22/17 1112 04/22/17 1230       Assessment/Plan HTN - home meds HLD ABL anemia - Hg 11.4, stable   Hyponatremia - 133 Moderate protein calorie malnutrition - continue tube feeds.  Adenocarcinoma of the gastric antrum S/p Diagnostic laparoscopy, distal gastrectomy with billroth II reconstruction, 16 Fr jejunostomy tube placement 12/13 Dr. Barry Dienes - POD 3 - path pending - OOB ambulate - passing flatus, tolerating TF at 56mL/hr, low NGT output  ID - cipro perioperative. WBC trending down 16.2, TMAX 99.8 FEN - IVF, TF at 46mL/hr VTE - SCDs Foley - continue foley to monitor UOP Follow up - [place]  Plan - D/c NG tube and give few ice chips. Increase TF to 94mL/hr. Encourage IS today. Transition to PO protonix and restart home HTN meds. Labs in AM.   LOS: 4 days    Wellington Hampshire , [facility] Surgery 04/25/2017, 9:19 AM Pager: [phone] Consults: [phone] Mon-Fri 7:00 am-4:30 pm Sat-Sun 7:00 am-11:30 am

## 2017-04-26 ENCOUNTER — Inpatient Hospital Stay (HOSPITAL_COMMUNITY): Payer: Medicare HMO

## 2017-04-26 DIAGNOSIS — M7989 Other specified soft tissue disorders: Secondary | ICD-10-CM

## 2017-04-26 LAB — BASIC METABOLIC PANEL
ANION GAP: 9 (ref 5–15)
BUN: 13 mg/dL (ref 6–20)
CALCIUM: 9 mg/dL (ref 8.9–10.3)
CHLORIDE: 101 mmol/L (ref 101–111)
CO2: 24 mmol/L (ref 22–32)
Creatinine, Ser: 0.95 mg/dL (ref 0.61–1.24)
GFR calc non Af Amer: >60 mL/min (ref >60)
GLUCOSE: 122 mg/dL — AB (ref 65–99)
Potassium: 3.5 mmol/L (ref 3.5–5.1)
Sodium: 134 mmol/L — ABNORMAL LOW (ref 135–145)

## 2017-04-26 LAB — CBC
HEMATOCRIT: 35.6 % — AB (ref 39.0–52.0)
HEMOGLOBIN: 11.5 g/dL — AB (ref 13.0–17.0)
MCH: 29.3 pg (ref 26.0–34.0)
MCHC: 32.3 g/dL (ref 30.0–36.0)
MCV: 90.8 fL (ref 78.0–100.0)
Platelets: 256 10*3/uL (ref 150–400)
RBC: 3.92 MIL/uL — ABNORMAL LOW (ref 4.22–5.81)
RDW: 13.1 % (ref 11.5–15.5)
WBC: 14.9 10*3/uL — AB (ref 4.0–10.5)

## 2017-04-26 MED ORDER — POLYETHYLENE GLYCOL 3350 17 G PO PACK
17.0000 g | PACK | Freq: Every day | ORAL | Status: DC
  Administered 2017-04-26 – 2017-04-28 (×2): 17 g via ORAL
  Filled 2017-04-26 (×3): qty 1

## 2017-04-26 MED ORDER — OSMOLITE 1.5 CAL PO LIQD
1000.0000 mL | ORAL | Status: DC
  Administered 2017-04-26: 1000 mL
  Filled 2017-04-26 (×3): qty 1000

## 2017-04-26 MED ORDER — BISACODYL 10 MG RE SUPP
10.0000 mg | Freq: Every day | RECTAL | Status: DC | PRN
  Administered 2017-04-26: 10 mg via RECTAL
  Filled 2017-04-26: qty 1

## 2017-04-26 MED ORDER — ENOXAPARIN SODIUM 40 MG/0.4ML ~~LOC~~ SOLN
40.0000 mg | SUBCUTANEOUS | Status: DC
  Administered 2017-04-26 – 2017-04-27 (×2): 40 mg via SUBCUTANEOUS
  Filled 2017-04-26 (×2): qty 0.4

## 2017-04-26 MED ORDER — OXYCODONE HCL 5 MG PO TABS
5.0000 mg | ORAL_TABLET | ORAL | Status: DC | PRN
  Administered 2017-04-27: 5 mg via ORAL
  Filled 2017-04-26: qty 2

## 2017-04-26 NOTE — Progress Notes (Signed)
Patient ID: Gabriel Poole, male   DOB: [birth year], 71 y.o.   MRN: [id number]  [facility] Surgery Progress Note  4 Days Post-Op  Subjective: CC-  Pt doing well.  Urinating s/p foley removal.    Objective: Vital signs in last 24 hours: Temp:  [97.3 F (36.3 C)-99.7 F (37.6 C)] 98.8 F (37.1 C) (12/17 0400) Pulse Rate:  [82-83] 83 (12/16 1745) Resp:  [14-20] 16 (12/17 0753) BP: (124-152)/(75-84) 151/84 (12/17 0400) SpO2:  [90 %-95 %] 92 % (12/17 0753) Last BM Date: 04/21/17  Intake/Output from previous day: 12/16 0701 - 12/17 0700 In: 598 [I.V.:400; NG/GT:198] Out: 650 [Urine:650] Intake/Output this shift: No intake/output data recorded.  PE: Gen:  Alert, NAD, pleasant HEENT: EOM's intact, pupils equal and round Card:  RRR Pulm:  Breathing comfortably Abd: Soft, non distended Psych: A&Ox3  Skin: no rashes noted, warm and dry  Lab Results:  Recent Labs    04/25/17 0236 04/26/17 0216  WBC 16.2* 14.9*  HGB 11.4* 11.5*  HCT 34.8* 35.6*  PLT 216 256   BMET Recent Labs    04/25/17 0236 04/26/17 0216  NA 133* 134*  K 3.7 3.5  CL 100* 101  CO2 25 24  GLUCOSE 106* 122*  BUN 11 13  CREATININE 1.03 0.95  CALCIUM 8.8* 9.0   PT/INR No results for input(s): LABPROT, INR in the last 72 hours. CMP     Component Value Date/Time   NA 134 (L) 04/26/2017 0216   NA 145 (H) 05/31/2015 1222   K 3.5 04/26/2017 0216   CL 101 04/26/2017 0216   CO2 24 04/26/2017 0216   GLUCOSE 122 (H) 04/26/2017 0216   BUN 13 04/26/2017 0216   BUN 11 05/31/2015 1222   CREATININE 0.95 04/26/2017 0216   CREATININE 1.18 11/15/2012 0934   CALCIUM 9.0 04/26/2017 0216   PROT 6.9 04/16/2017 1130   PROT 7.0 05/31/2015 1222   ALBUMIN 4.0 04/16/2017 1130   ALBUMIN 4.6 05/31/2015 1222   AST 29 04/16/2017 1130   ALT 21 04/16/2017 1130   ALKPHOS 95 04/16/2017 1130   BILITOT 1.1 04/16/2017 1130   BILITOT 0.5 05/31/2015 1222   GFRNONAA >60 04/26/2017 0216   GFRNONAA 64 11/15/2012  0934   GFRAA >60 04/26/2017 0216   GFRAA 74 11/15/2012 0934   Lipase  No results found for: LIPASE     Studies/Results: No results found.  Anti-infectives: Anti-infectives (From admission, onward)   Start     Dose/Rate Route Frequency Ordered Stop   04/22/17 1900  ciprofloxacin (CIPRO) IVPB 400 mg     400 mg 200 mL/hr over 60 Minutes Intravenous Every 12 hours 04/22/17 1825 04/22/17 2045   04/22/17 1118  ciprofloxacin (CIPRO) 400 MG/200ML IVPB    Comments:  [place], Jessica   : cabinet override      04/22/17 1118 04/22/17 1230   04/22/17 1112  ciprofloxacin (CIPRO) IVPB 400 mg     400 mg 200 mL/hr over 60 Minutes Intravenous On call to O.R. 04/22/17 1112 04/22/17 1230       Assessment/Plan HTN - home meds HLD ABL anemia - Hg 11.4, stable   Hyponatremia - 133 Moderate protein calorie malnutrition - continue tube feeds.    Adenocarcinoma of the gastric antrum S/p Diagnostic laparoscopy, distal gastrectomy with billroth II reconstruction, 16 Fr jejunostomy tube placement 12/13 Dr. Donell Beers - POD 4 - path pending - OOB ambulate - passing flatus, increase tube feeds.  Advance diet.  ID - cipro perioperative.  FEN - saline lock ivf.   VTE - SCDs.  Add lovenox now that epidural is out.   Foley - d/c foley Follow up - Gabriel Poole   LOS: 5 days    Almond Lint , MD [facility] Surgery 04/26/2017, 8:50 AM

## 2017-04-26 NOTE — Care Management Important Message (Signed)
Important Message  Patient Details  Name: Gabriel Poole MRN: [id number] Date of Birth: 1945/09/19   Medicare Important Message Given:  Yes    Nathen May 04/26/2017, 10:39 AM

## 2017-04-26 NOTE — Progress Notes (Signed)
Pt accidentally removed IV site.  PCA pump stopped, 20mls in syringe wasted and replacement syringe ordered from pharmacy.  Philippa Sicks, RN witness to waste.  IV team consult placed for new start.

## 2017-04-26 NOTE — Progress Notes (Signed)
VASCULAR LAB PRELIMINARY  PRELIMINARY  PRELIMINARY  PRELIMINARY  Right upper extremity venous duplex completed.    Preliminary report:  There is no DVT or SVT noted in the right upper extremity.  Significant interstitial fluid noted throughout.  Rennee Coyne, RVT 04/26/2017, 9:28 AM

## 2017-04-26 NOTE — Progress Notes (Signed)
Dr. Dema Severin called back and advise to doppler the right wrist

## 2017-04-26 NOTE — Progress Notes (Signed)
Pt Right FA IV is infiltrated, IV team called for assessment. IV team confirm that it is infiltrated. MD notified awaiting for returned phone call. We'll continue to monitor Pt.

## 2017-04-26 NOTE — Progress Notes (Signed)
Clinical Social Worker acknowledges consult for SNF. At this time patient doe snot have a consult for PT/OT. Patient will need to be consulted by both disciplines for CSW to be able to assess for SNF placement. RN aware for consult needing to be placed for patient.    Rhea Pink, MSW,  [place]

## 2017-04-27 LAB — BASIC METABOLIC PANEL
ANION GAP: 9 (ref 5–15)
BUN: 17 mg/dL (ref 6–20)
CALCIUM: 9.1 mg/dL (ref 8.9–10.3)
CO2: 23 mmol/L (ref 22–32)
CREATININE: 0.96 mg/dL (ref 0.61–1.24)
Chloride: 104 mmol/L (ref 101–111)
GFR calc non Af Amer: >60 mL/min (ref >60)
Glucose, Bld: 114 mg/dL — ABNORMAL HIGH (ref 65–99)
Potassium: 3.5 mmol/L (ref 3.5–5.1)
SODIUM: 136 mmol/L (ref 135–145)

## 2017-04-27 LAB — CBC
HEMATOCRIT: 34.2 % — AB (ref 39.0–52.0)
Hemoglobin: 11.2 g/dL — ABNORMAL LOW (ref 13.0–17.0)
MCH: 29.6 pg (ref 26.0–34.0)
MCHC: 32.7 g/dL (ref 30.0–36.0)
MCV: 90.2 fL (ref 78.0–100.0)
Platelets: 265 10*3/uL (ref 150–400)
RBC: 3.79 MIL/uL — ABNORMAL LOW (ref 4.22–5.81)
RDW: 13 % (ref 11.5–15.5)
WBC: 13 10*3/uL — AB (ref 4.0–10.5)

## 2017-04-27 MED ORDER — GABAPENTIN 100 MG PO CAPS
200.0000 mg | ORAL_CAPSULE | Freq: Two times a day (BID) | ORAL | Status: DC
  Administered 2017-04-27 – 2017-04-28 (×3): 200 mg via ORAL
  Filled 2017-04-27 (×3): qty 2

## 2017-04-27 MED ORDER — ACETAMINOPHEN 325 MG PO TABS: 650.0000 mg | ORAL_TABLET | Freq: Four times a day (QID) | ORAL | Status: DC | PRN

## 2017-04-27 MED ORDER — OSMOLITE 1.5 CAL PO LIQD
1000.0000 mL | ORAL | Status: DC
  Administered 2017-04-27 (×2): 1000 mL
  Filled 2017-04-27 (×5): qty 1000

## 2017-04-27 NOTE — Progress Notes (Signed)
PCA d/c. 17 ml of Morphine wasted with Ronalee Belts, RN.

## 2017-04-27 NOTE — Care Management Note (Signed)
Case Management Note  Patient Details  Name: Gabriel Poole MRN: [id number] Date of Birth: 04/12/1946  Subjective/Objective:                    Action/Plan:  Spoke with patient and his ex wife at bedside. Patient is planning on discharging tomorrow on tube feedings. Per Dr note at home tube feedings will be 12 to 14 hours a day. Call [facility] with RD asked for tube feeding recommendations for home 12 to 14 hours a day. Once note entered NCM will order home tube feedings.  Patient will be discharging to his ex wife's home [name] [phone] who will provide 24 hour supervision. Address [address] [place] , [place], [place] Asked bedside nurse [name] to begin tube feeding education.   Explained to patient and Ms Rudean Curt nurse will begin education here and home health RN will continue and plan TF 12 to 14 hours a day.  Expected Discharge Date:                  Expected Discharge Plan:  [place]  In-House Referral:  Clinical Social Work  Discharge planning Services  CM Consult  Post Acute Care Choice:  Home Health, Durable Medical Equipment Choice offered to:  Patient, Spouse  DME Arranged:  Tube feeding, Tube feeding pump DME Agency:  [place]:  RN [facility] Agency:  [place]  Status of Service:  In process, will continue to follow  If discussed at Long Length of Stay Meetings, dates discussed:    Additional Comments:  Gabriel Favre, RN 04/27/2017, 3:14 PM

## 2017-04-27 NOTE — Care Management Note (Signed)
Case Management Note Marvetta Gibbons RN, BSN Unit 4E-Case Manager [phone]  Patient Details  Name: Gabriel Poole MRN: [id number] Date of Birth: 10-30-1945  Subjective/Objective:  Pt admitted with Adenocarcinoma of the gastricantrum S/p Diagnostic laparoscopy,distalgastrectomy with billroth II reconstruction, 83 Frjejunostomy tube placement 12/13              Action/Plan: PTA pt lived at home, PT/OT evals pending, CSW following for possible SNF placement, CM has referral for [facility] needs- pt will need TF at discharge- will await therapy recommendations and follow for transition of care.   Expected Discharge Date:                  Expected Discharge Plan:  Skilled Nursing Facility  In-House Referral:  Clinical Social Work  Discharge planning Services  CM Consult  Post Acute Care Choice:  Home Health Choice offered to:     DME Arranged:    DME Agency:     HH Arranged:    [place] Agency:     Status of Service:  In process, will continue to follow  If discussed at Long Length of Stay Meetings, dates discussed:    Discharge Disposition:   Additional Comments:  Dawayne Patricia, RN 04/27/2017, 9:59 AM

## 2017-04-27 NOTE — Evaluation (Signed)
Physical Therapy Evaluation Patient Details Name: Gabriel Poole MRN: [id number] DOB: 12/15/1945 Today's Date: 04/27/2017   History of Present Illness  71yo male with new diagnosis of gastric cancer; received surgical intervention for diagnositic laproscopy, partial gastrectomy, and jejunal feeding tube on 04/22/17.     Clinical Impression  Patient received standing beside his bed, pleasant and willing to work with skilled PT services today. B LE strength appears generally WNL per MMT testing, and patient able to ambulate approximately 149f in hallway with general S this morning, no significant concerns for balance deficit or loss of balance. Patient will benefit from ongoing skilled PT care while in the hospital, however he does not appear to be in need of skilled PT services following his course of hospitalization. Patient left in bed with all needs met, all questions and concerns addressed this morning.     Follow Up Recommendations No PT follow up    Equipment Recommendations  None recommended by PT(patient already owns all likely required/appropriate DME )    Recommendations for Other Services       Precautions / Restrictions Precautions Precautions: Other (comment) Precaution Comments: jejunal feeding tube  Restrictions Weight Bearing Restrictions: No      Mobility  Bed Mobility Overal bed mobility: Modified Independent             General bed mobility comments: received standing independently at bedside; able to perform sit to supine with Mod(I) at end of session   Transfers Overall transfer level: Needs assistance Equipment used: None Transfers: Sit to/from Stand Sit to Stand: Supervision            Ambulation/Gait Ambulation/Gait assistance: Supervision Ambulation Distance (Feet): 120 Feet Assistive device: None Gait Pattern/deviations: WFL(Within Functional Limits)     General Gait Details: no significant unsteadiness noted, gait appears [place]   Stairs            Wheelchair Mobility    Modified Rankin (Stroke Patients Only)       Balance Overall balance assessment: No apparent balance deficits (not formally assessed)                                           Pertinent Vitals/Pain Pain Assessment: No/denies pain    Home Living Family/patient expects to be discharged to:: Private residence Living Arrangements: Spouse/significant other Available Help at Discharge: Family Type of Home: Apartment Home Access: Stairs to enter Entrance Stairs-Rails: Right Entrance Stairs-Number of Steps: flight  Home Layout: One level Home Equipment: WEnvironmental consultant- 2 wheels;Crutches Additional Comments: reports he has walker and crutches at his apartment; he will be staying at his wife's apartment for awhile and is not sure what she has     Prior Function Level of Independence: Independent         Comments: worked full time at [employer]       Extremity/Trunk Assessment        Lower Extremity Assessment Lower Extremity Assessment: Overall WFL for tasks assessed    Cervical / Trunk Assessment Cervical / Trunk Assessment: Normal  Communication   Communication: No difficulties  Cognition Arousal/Alertness: Awake/alert Behavior During Therapy: WFL for tasks assessed/performed Overall Cognitive Status: Within Functional Limits for tasks assessed  General Comments      Exercises     Assessment/Plan    PT Assessment Patient needs continued PT services  PT Problem List Decreased mobility;Decreased coordination;Decreased activity tolerance       PT Treatment Interventions Therapeutic activities;Gait training;Therapeutic exercise;Patient/family education;Stair training;Balance training;Functional mobility training;Neuromuscular re-education    PT Goals (Current goals can be found in the Care Plan section)  Acute Rehab PT  Goals Patient Stated Goal: to go home  PT Goal Formulation: With patient Time For Goal Achievement: 05/04/17 Potential to Achieve Goals: Good    Frequency Min 2X/week   Barriers to discharge        Co-evaluation               AM-PAC PT "6 Clicks" Daily Activity  Outcome Measure Difficulty turning over in bed (including adjusting bedclothes, sheets and blankets)?: None Difficulty moving from lying on back to sitting on the side of the bed? : None Difficulty sitting down on and standing up from a chair with arms (e.g., wheelchair, bedside commode, etc,.)?: None Help needed moving to and from a bed to chair (including a wheelchair)?: None Help needed walking in hospital room?: None Help needed climbing 3-5 steps with a railing? : A Little 6 Click Score: 23    End of Session   Activity Tolerance: Patient tolerated treatment well Patient left: in bed;with call bell/phone within reach Nurse Communication: Other (comment)(RN requests that feeding tube be left connected/running during PT session) PT Visit Diagnosis: Other abnormalities of gait and mobility (R26.89);Muscle weakness (generalized) (M62.81)    Time: 1020-1040 PT Time Calculation (min) (ACUTE ONLY): 20 min   Charges:   PT Evaluation $PT Eval Low Complexity: 1 Low     PT G Codes:   PT G-Codes **NOT FOR INPATIENT CLASS** Functional Assessment Tool Used: AM-PAC 6 Clicks Basic Mobility;Clinical judgement Functional Limitation: Mobility: Walking and moving around Mobility: Walking and Moving Around Current Status (Z9688): At least 1 percent but less than 20 percent impaired, limited or restricted Mobility: Walking and Moving Around Goal Status [phone]): At least 1 percent but less than 20 percent impaired, limited or restricted    Deniece Ree PT, DPT, CBIS  Supplemental Physical Therapist [facility]

## 2017-04-27 NOTE — Progress Notes (Signed)
Nutrition Follow-up  DOCUMENTATION CODES:   Non-severe (moderate) malnutrition in context of chronic illness  INTERVENTION:   -Continue Osmolite 1.5 @ 60 ml/hr via j-tube   Tube feeding regimen provides 2160 kcal (100% of needs), 90 grams of protein, and 1097 ml of H2O.   -For transition to nocturnal feedings, recommend:  Initiate nocturnal feedings of Osmolite 1.5 @ 105 ml/hr via j-tube over 14 hour period  Recommend 100 ml free water flush QID  Tube feeding regimen provides 2205 kcal (100% of needs), 92 grams of protein, and 1520 ml of H2O.   NUTRITION DIAGNOSIS:   Moderate Malnutrition related to chronic illness(gastric cancer) as evidenced by moderate muscle depletion, moderate fat depletion.  Ongoing  GOAL:   Patient will meet greater than or equal to 90% of their needs  Met with TF  MONITOR:   Diet advancement, TF tolerance  REASON FOR ASSESSMENT:   Consult Enteral/tube feeding initiation and management  ASSESSMENT:   Pt with PMH of HTN, HLD admitted with dx of gastric cancer now s/p distal gastrectomy with billroth II reconstruction and 24 F J tube placement 12/13.   Spoke with RNCM, who reports pt will discharge home tomorrow. Per MD note, pt will require TF at home, but will transition to feedings for 12-14 hours per day. RNCM to order home TF once recommendations are in place.   Osmolite 1.5 currently infusing via j-tube at 60 ml/hr. Regimen providing 2160 kcals, 90 grams protein, and 1097 ml free water daily (100% of estimated nutritional needs). Pt also has been advanced to a soft diet, consuming 0-25% of meals.   Labs reviewed.   Diet Order:  DIET SOFT Room service appropriate? Yes; Fluid consistency: Thin  EDUCATION NEEDS:   Education needs have been addressed  Skin:  Skin Assessment: Skin Integrity Issues: Incisions: closed abdomen  Last BM:  04/26/17  Height:   Ht Readings from Last 1 Encounters:  04/23/17 '5\' 10"'  (1.778 m)    Weight:    Wt Readings from Last 1 Encounters:  04/23/17 174 lb 2.6 oz (79 kg)    Ideal Body Weight:  75.4 kg  BMI:  Body mass index is 24.99 kg/m.  Estimated Nutritional Needs:   Kcal:  2050-2300  Protein:  110-120 grams  Fluid:  > 2 L/day    Gabriel Poole A. Jimmye Norman, RD, LDN, CDE Pager: [phone] After hours Pager: [phone]

## 2017-04-27 NOTE — Evaluation (Signed)
Occupational Therapy Evaluation Patient Details Name: Gabriel Poole MRN: [id number] DOB: December 19, 1945 Today's Date: 04/27/2017    History of Present Illness 71yo male with new diagnosis of gastric cancer; received surgical intervention for diagnositic laproscopy, partial gastrectomy, and jejunal feeding tube on 04/22/17.    Clinical Impression   Patient evaluated by Occupational Therapy with no further acute OT needs identified. He is able to complete ADL at modified independent level and will have initial 24 hour assistance/supervision from wife. All education has been completed and the patient has no further questions. See below for any follow-up Occupational Therapy or equipment needs. OT to sign off. Thank you for referral.      Follow Up Recommendations  No OT follow up;Supervision/Assistance - 24 hour(initial)    Equipment Recommendations  None recommended by OT    Recommendations for Other Services       Precautions / Restrictions Precautions Precautions: Other (comment) Precaution Comments: jejunal feeding tube  Restrictions Weight Bearing Restrictions: No      Mobility Bed Mobility Overal bed mobility: Modified Independent             General bed mobility comments: received standing independently at bedside; able to perform sit to supine with Mod(I) at end of session   Transfers Overall transfer level: Modified independent Equipment used: None Transfers: Sit to/from Stand Sit to Stand: Supervision              Balance Overall balance assessment: No apparent balance deficits (not formally assessed)                                         ADL either performed or assessed with clinical judgement   ADL Overall ADL's : Modified independent                                       General ADL Comments: Increased time secondary to pain     Vision   Vision Assessment?: No apparent visual deficits     Perception      Praxis      Pertinent Vitals/Pain Pain Assessment: No/denies pain     Hand Dominance     Extremity/Trunk Assessment Upper Extremity Assessment Upper Extremity Assessment: Overall WFL for tasks assessed   Lower Extremity Assessment Lower Extremity Assessment: Overall WFL for tasks assessed   Cervical / Trunk Assessment Cervical / Trunk Assessment: Normal   Communication Communication Communication: No difficulties   Cognition Arousal/Alertness: Awake/alert Behavior During Therapy: WFL for tasks assessed/performed Overall Cognitive Status: Within Functional Limits for tasks assessed                                 General Comments: Disoreinted to date but able to utilize wall calendar to determine.    General Comments       Exercises     Shoulder Instructions      Home Living Family/patient expects to be discharged to:: Private residence Living Arrangements: Spouse/significant other Available Help at Discharge: Family Type of Home: Apartment Home Access: Stairs to enter Secretary/administrator of Steps: flight  Entrance Stairs-Rails: Right Home Layout: One level     Bathroom Shower/Tub: Walk-in shower         Home Equipment: Environmental consultant -  2 wheels;Crutches   Additional Comments: reports he has walker and crutches at his apartment; he will be staying at his wife's apartment for awhile and is not sure what she has       Prior Functioning/Environment Level of Independence: Independent        Comments: worked full time at [employer]         OT Problem List: Decreased knowledge of precautions      OT Treatment/Interventions:      OT Goals(Current goals can be found in the care plan section) Acute Rehab OT Goals Patient Stated Goal: to go home  OT Goal Formulation: With patient  OT Frequency:     Barriers to D/C:            Co-evaluation              AM-PAC PT "6 Clicks" Daily Activity     Outcome Measure Help from another  person eating meals?: None Help from another person taking care of personal grooming?: None Help from another person toileting, which includes using toliet, bedpan, or urinal?: None Help from another person bathing (including washing, rinsing, drying)?: None Help from another person to put on and taking off regular upper body clothing?: None Help from another person to put on and taking off regular lower body clothing?: None 6 Click Score: 24   End of Session Nurse Communication: Mobility status(pt requesting Sprite)  Activity Tolerance: Patient tolerated treatment well Patient left: in bed;with call bell/phone within reach  OT Visit Diagnosis: Other abnormalities of gait and mobility (R26.89)                Time: 2703-5009 OT Time Calculation (min): 14 min Charges:  OT General Charges $OT Visit: 1 Visit OT Evaluation $OT Eval Low Complexity: 1 Low G-Codes:     Doristine Section, MS OTR/L  Pager: [phone]   Shikha Bibb A Jerilyn Gillaspie 04/27/2017, 1:03 PM

## 2017-04-28 MED ORDER — OXYCODONE HCL 5 MG PO TABS: 5.0000 mg | ORAL_TABLET | ORAL | 0 refills | Status: DC | PRN

## 2017-04-28 NOTE — Discharge Summary (Signed)
Physician Discharge Summary  Patient ID: Gabriel Poole MRN: [id number] DOB/AGE: 05/23/1945 71 y.o.  Admit date: 04/21/2017 Discharge date: 04/29/2017  Admission Diagnoses: Patient Active Problem List   Diagnosis Date Noted  . 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 Malignant neoplasm of body of stomach ([place])   . Abdominal pain, epigastric 02/01/2017  . 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 Pain of right lower leg 11/29/2014  . Elevated PSA, less than 10 ng/ml 10/31/2014  . Noncompliance 09/21/2012  . HTN (hypertension) 09/21/2012  . HLD (hyperlipidemia) 09/21/2012  . Gout 09/21/2012  . Hydrocele 12/17/2010  . 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 Severe protein calorie malnutrition Inadequate oral intake  Discharged Condition: stable  Hospital Course:  Pt was admitted to stepdown following dx laparoscopy, open distal gastrectomy with billroth II anastamosis and feeding jejunostomy tube.  He had an epidural which provided good pain control in conjunction with low dose dilaudid.  His NGT was left in place for 3 days.  He was then able to have his epidural and foley removed.  He started on sips.  His tube feeds were started at trophic level on POD 2.  He was ambulatory.  His was able to void after foley removal.  He tolerated diet advance, but was only able to tolerated small amounts of food.  His tube feeds were advanced to goal.  He was transferred to the floor.  He originally was going to go to a SNF prior to going home, but he did too well with PT.  He is discharged to home on oral pain medication, feeds at around 75% of goal with soft diet for remaining 25 % of caloric needs.  He is in stable condition.    Consults: anesthesia for epidural  Significant Diagnostic Studies: labs: HCT 34.1 prior to d/c.  WBCs 13.0, but down from 21 on  POD 1. Cr stable at 0.96.  Treatments: surgery: see above  Discharge Exam: Blood pressure 136/69, pulse 85, temperature 98.3 F (36.8 C), resp. rate 17, height _0  (1.778 m), weight 79 kg (174 lb 2.6 oz), SpO2 97 %. General appearance: alert, cooperative and no distress Resp: breathing comfortably Cardio: regular rate and rhythm GI: soft, non distended, wound c/d/i.  no drainage.  j tube in place Extremities: extremities normal, atraumatic, no cyanosis or edema  Disposition: 01-Home or Self Care  Discharge Instructions    Ambulatory referral to [facility]   Complete by:  As directed    Please evaluate Gabriel Poole for admission to [facility].  Disciplines requested: Nursing  Services to provide: Other: tube feeds  Physician to follow patient's care (the person listed here will be responsible for signing ongoing orders): Referring Provider  Requested Start of Care Date: Today (Please call ahead to confirm availability)  I certify that this patient is under my care and that I, or a Nurse Practitioner or Physician's Assistant working with me, had a face-to-face encounter that meets the physician face-to-face requirements with patient on  04/27/2017. The encounter with the patient was in whole, or in part for the following medical condition(s) which is the primary reason for home health care (List medical condition). Gastric cancer, protein calorie nutrition  Special Instructions:   Does the patient have Medicare or Medicaid?:  Yes   The encounter with the patient was in whole, or in  part, for the following medical condition, which is the primary reason for home health care:  gastric cancer, protein calorie malnutrition   Reason for Medically Necessary Home Health Services:  Skilled Nursing- Assessment and Training for Infusion Therapy, Line Care, and Infection Control   My clinical findings support the need for the above services:  Unable to leave home safely without  assistance and/or assistive device   I certify that, based on my findings, the following services are medically necessary home health services:  Nursing   Further, I certify that my clinical findings support that this patient is homebound due to:  Unable to leave home safely without assistance   Call MD for:  difficulty breathing, headache or visual disturbances   Complete by:  As directed    Call MD for:  persistant dizziness or light-headedness   Complete by:  As directed    Call MD for:  persistant nausea and vomiting   Complete by:  As directed    Call MD for:  redness, tenderness, or signs of infection (pain, swelling, redness, odor or green/yellow discharge around incision site)   Complete by:  As directed    Call MD for:  severe uncontrolled pain   Complete by:  As directed    Call MD for:  temperature >100.4   Complete by:  As directed    Change dressing (specify)   Complete by:  As directed    J tube care   Discharge diet:   Complete by:  As directed    soft   For home use only DME Tube feeding   Complete by:  As directed    Pt will require tube feeding greater than 90 days.   Non-severe (moderate) malnutrition in context of chronic illness  INTERVENTION:   -Osmolite 80 mL/hr x 14 hours.  This is 1680 kCal, 70 gm protein.  This is 76% calories.  The remaining calories will be from oral intake of soft diet with thin liquids.    -For transition to nocturnal feedings, recommend: Recommend 100 ml free water flush QID   NUTRITION DIAGNOSIS:   Moderate Malnutrition related to chronic illness(gastric cancer) as evidenced by moderate muscle depletion, moderate fat depletion.  Ongoing  GOAL:   Patient will meet greater than or equal to 90% of their needs  Met with TF  MONITOR:   Diet advancement, TF tolerance  REASON FOR ASSESSMENT:   Consult Enteral/tube feeding initiation and management  ASSESSMENT:   Pt with PMH of HTN, HLD admitted with  dx of gastric cancer now s/p distal gastrectomy with billroth II reconstruction and 17 F J tube placement 12/13.    Pt also has been advanced to a soft diet, consuming 0-25% of meals.   Labs reviewed.   Diet Order:  DIET SOFT Room service appropriate? Yes; Fluid consistency: Thin   Increase activity slowly   Complete by:  As directed      Allergies as of 04/28/2017      Reactions   Penicillins Itching, Other (See Comments)    patient had a PCN reaction causing immediate rash, facial/tongue/throat swelling, SOB or lightheadedness with hypotension: Unknown Has patient had a PCN reaction causing severe rash involving mucus membranes or skin necrosis: Unknown Has patient had a PCN reaction that required hospitalization: Unknown Has patient had a PCN reaction occurring within the last 10 years: No If all of the above answers are "NO", then may proceed with Cephalosporin use.   Ace Inhibitors Other (See Comments)  Pt states not allergic   Losartan Itching, Other (See Comments)   Pt states not allergic      Medication List    TAKE these medications   amLODipine 10 MG tablet Commonly known as:  NORVASC Take 1 tablet (10 mg total) by mouth daily.   aspirin EC 81 MG tablet Take 81 mg by mouth daily.   atorvastatin 40 MG tablet Commonly known as:  LIPITOR Take 1 tablet (40 mg total) by mouth daily at 6 PM.   metoprolol tartrate 50 MG tablet Commonly known as:  LOPRESSOR Take 50 mg by mouth 2 (two) times daily.   oxyCODONE 5 MG immediate release tablet Commonly known as:  Oxy IR/ROXICODONE Take 1-2 tablets (5-10 mg total) by mouth every 4 (four) hours as needed for moderate pain, severe pain or breakthrough pain.   pantoprazole 40 MG tablet Commonly known as:  PROTONIX Take 1 tablet (40 mg total) by mouth 2 (two) times daily before a meal.   sucralfate 1 g tablet Commonly known as:  CARAFATE Take 1 g by mouth 2 (two) times daily.   Vitamin D3 5000 units Caps Take 5,000  Units by mouth daily.            Durable Medical Equipment  (From admission, onward)        Start     Ordered   04/28/17 0000  For home use only DME Tube feeding    Comments:  Pt will require tube feeding greater than 90 days.   Non-severe (moderate) malnutrition in context of chronic illness  INTERVENTION:   -Osmolite 80 mL/hr x 14 hours.  This is 1680 kCal, 70 gm protein.  This is 76% calories.  The remaining calories will be from oral intake of soft diet with thin liquids.    -For transition to nocturnal feedings, recommend: Recommend 100 ml free water flush QID   NUTRITION DIAGNOSIS:   Moderate Malnutrition related to chronic illness(gastric cancer) as evidenced by moderate muscle depletion, moderate fat depletion.  Ongoing  GOAL:   Patient will meet greater than or equal to 90% of their needs  Met with TF  MONITOR:   Diet advancement, TF tolerance  REASON FOR ASSESSMENT:   Consult Enteral/tube feeding initiation and management  ASSESSMENT:   Pt with PMH of HTN, HLD admitted with dx of gastric cancer now s/p distal gastrectomy with billroth II reconstruction and 46 F J tube placement 12/13.    Pt also has been advanced to a soft diet, consuming 0-25% of meals.   Labs reviewed.   Diet Order:  DIET SOFT Room service appropriate? Yes; Fluid consistency: Thin   04/28/17 0856       Discharge Care Instructions  (From admission, onward)        Start     Ordered   04/28/17 0000  Change dressing (specify)    Comments:  J tube care   04/28/17 0856     Follow-up [facility] Follow up in 1 week(s).   Why:  for staple removal Contact information: [address] [place] [address]       Stark Klein, MD Follow up in 2 week(s).   Specialty:  General Surgery Contact information: [address] [place] [address] [phone]            Signed: Stark Klein 04/29/2017, 1:27 PM

## 2017-04-28 NOTE — Discharge Instructions (Signed)
[facility] °Office Phone Number [phone] ° ° POST OP INSTRUCTIONS ° °Always review your discharge instruction sheet given to you by the facility where your surgery was performed. ° °IF YOU HAVE DISABILITY OR FAMILY LEAVE FORMS, YOU MUST BRING THEM TO THE OFFICE FOR PROCESSING.  DO NOT GIVE THEM TO YOUR DOCTOR. ° °1. A prescription for pain medication may be given to you upon discharge.  Take your pain medication as prescribed, if needed.  If narcotic pain medicine is not needed, then you may take acetaminophen (Tylenol) or ibuprofen (Advil) as needed. °2. Take your usually prescribed medications unless otherwise directed °3. If you need a refill on your pain medication, please contact your pharmacy.  They will contact our office to request authorization.  Prescriptions will not be filled after 5pm or on week-ends. °4. You should eat very light the first 24 hours after surgery, such as soup, crackers, pudding, etc.  Resume your normal diet the day after surgery °5. It is common to experience some constipation if taking pain medication after surgery.  Increasing fluid intake and taking a stool softener will usually help or prevent this problem from occurring.  A mild laxative (Milk of Magnesia or Miralax) should be taken according to package directions if there are no bowel movements after 48 hours. °6. You may shower in 48 hours.  The surgical glue will flake off in 2-3 weeks.   °7. ACTIVITIES:  No strenuous activity or heavy lifting for 1 week.   °a. You may drive when you no longer are taking prescription pain medication, you can comfortably wear a seatbelt, and you can safely maneuver your car and apply brakes. °b. RETURN TO WORK:  __________to be determined._______________ °You should see your doctor in the office for a follow-up appointment approximately three-four weeks after your surgery.   ° °WHEN TO CALL YOUR DOCTOR: °1. Fever over 101.0 °2. Nausea and/or vomiting. °3. Extreme swelling  or bruising. °4. Continued bleeding from incision. °5. Increased pain, redness, or drainage from the incision. ° °The clinic staff is available to answer your questions during regular business hours.  Please don’t hesitate to call and ask to speak to one of the nurses for clinical concerns.  If you have a medical emergency, go to the nearest emergency room or call 911.  A surgeon from [facility] is always on call at the hospital. ° °For further questions, please visit [email or web address]  ° °

## 2017-04-28 NOTE — Plan of Care (Signed)
  Nutrition: Adequate nutrition will be maintained 04/28/2017 0442 - Progressing by Fransisco Hertz, RN

## 2017-04-28 NOTE — Progress Notes (Signed)
Discharge home. HH needs assisted by CM. [facility] came in to deliver tube feeding but patient refused to take it home stating he dont have any to help him bring that home. [facility] arranged for delivery. Educated patient how to start feeding to the patient and ex wife. Patient verbalizes he can do it. I told them that they will be educated more by the [facility] nurse and will further be given more instruction how to get help in case of any problem.

## 2017-04-29 ENCOUNTER — Other Ambulatory Visit: Payer: Self-pay | Admitting: General Surgery

## 2017-04-29 DIAGNOSIS — I10 Essential (primary) hypertension: Secondary | ICD-10-CM | POA: Diagnosis not present

## 2017-04-29 DIAGNOSIS — Z903 Acquired absence of stomach [part of]: Secondary | ICD-10-CM | POA: Diagnosis not present

## 2017-04-29 DIAGNOSIS — E785 Hyperlipidemia, unspecified: Secondary | ICD-10-CM | POA: Diagnosis not present

## 2017-04-29 DIAGNOSIS — C162 Malignant neoplasm of body of stomach: Secondary | ICD-10-CM | POA: Diagnosis not present

## 2017-04-29 DIAGNOSIS — Z434 Encounter for attention to other artificial openings of digestive tract: Secondary | ICD-10-CM | POA: Diagnosis not present

## 2017-04-29 DIAGNOSIS — C163 Malignant neoplasm of pyloric antrum: Secondary | ICD-10-CM | POA: Diagnosis not present

## 2017-04-29 DIAGNOSIS — E46 Unspecified protein-calorie malnutrition: Secondary | ICD-10-CM | POA: Diagnosis not present

## 2017-04-29 DIAGNOSIS — R1310 Dysphagia, unspecified: Secondary | ICD-10-CM | POA: Diagnosis not present

## 2017-04-29 DIAGNOSIS — Z483 Aftercare following surgery for neoplasm: Secondary | ICD-10-CM | POA: Diagnosis not present

## 2017-04-29 DIAGNOSIS — F17200 Nicotine dependence, unspecified, uncomplicated: Secondary | ICD-10-CM | POA: Diagnosis not present

## 2017-04-30 DIAGNOSIS — E785 Hyperlipidemia, unspecified: Secondary | ICD-10-CM | POA: Diagnosis not present

## 2017-04-30 DIAGNOSIS — E46 Unspecified protein-calorie malnutrition: Secondary | ICD-10-CM | POA: Diagnosis not present

## 2017-04-30 DIAGNOSIS — Z483 Aftercare following surgery for neoplasm: Secondary | ICD-10-CM | POA: Diagnosis not present

## 2017-04-30 DIAGNOSIS — F17200 Nicotine dependence, unspecified, uncomplicated: Secondary | ICD-10-CM | POA: Diagnosis not present

## 2017-04-30 DIAGNOSIS — Z903 Acquired absence of stomach [part of]: Secondary | ICD-10-CM | POA: Diagnosis not present

## 2017-04-30 DIAGNOSIS — I10 Essential (primary) hypertension: Secondary | ICD-10-CM | POA: Diagnosis not present

## 2017-04-30 DIAGNOSIS — C162 Malignant neoplasm of body of stomach: Secondary | ICD-10-CM | POA: Diagnosis not present

## 2017-04-30 DIAGNOSIS — Z434 Encounter for attention to other artificial openings of digestive tract: Secondary | ICD-10-CM | POA: Diagnosis not present

## 2017-04-30 DIAGNOSIS — C163 Malignant neoplasm of pyloric antrum: Secondary | ICD-10-CM | POA: Diagnosis not present

## 2017-05-03 DIAGNOSIS — Z903 Acquired absence of stomach [part of]: Secondary | ICD-10-CM | POA: Diagnosis not present

## 2017-05-03 DIAGNOSIS — E46 Unspecified protein-calorie malnutrition: Secondary | ICD-10-CM | POA: Diagnosis not present

## 2017-05-03 DIAGNOSIS — E785 Hyperlipidemia, unspecified: Secondary | ICD-10-CM | POA: Diagnosis not present

## 2017-05-03 DIAGNOSIS — Z434 Encounter for attention to other artificial openings of digestive tract: Secondary | ICD-10-CM | POA: Diagnosis not present

## 2017-05-03 DIAGNOSIS — Z483 Aftercare following surgery for neoplasm: Secondary | ICD-10-CM | POA: Diagnosis not present

## 2017-05-03 DIAGNOSIS — C162 Malignant neoplasm of body of stomach: Secondary | ICD-10-CM | POA: Diagnosis not present

## 2017-05-03 DIAGNOSIS — C163 Malignant neoplasm of pyloric antrum: Secondary | ICD-10-CM | POA: Diagnosis not present

## 2017-05-03 DIAGNOSIS — I10 Essential (primary) hypertension: Secondary | ICD-10-CM | POA: Diagnosis not present

## 2017-05-03 DIAGNOSIS — F17200 Nicotine dependence, unspecified, uncomplicated: Secondary | ICD-10-CM | POA: Diagnosis not present

## 2017-05-04 DIAGNOSIS — Z903 Acquired absence of stomach [part of]: Secondary | ICD-10-CM | POA: Diagnosis not present

## 2017-05-04 DIAGNOSIS — Z434 Encounter for attention to other artificial openings of digestive tract: Secondary | ICD-10-CM | POA: Diagnosis not present

## 2017-05-04 DIAGNOSIS — C162 Malignant neoplasm of body of stomach: Secondary | ICD-10-CM | POA: Diagnosis not present

## 2017-05-04 DIAGNOSIS — E785 Hyperlipidemia, unspecified: Secondary | ICD-10-CM | POA: Diagnosis not present

## 2017-05-04 DIAGNOSIS — Z483 Aftercare following surgery for neoplasm: Secondary | ICD-10-CM | POA: Diagnosis not present

## 2017-05-04 DIAGNOSIS — F17200 Nicotine dependence, unspecified, uncomplicated: Secondary | ICD-10-CM | POA: Diagnosis not present

## 2017-05-04 DIAGNOSIS — C163 Malignant neoplasm of pyloric antrum: Secondary | ICD-10-CM | POA: Diagnosis not present

## 2017-05-04 DIAGNOSIS — I10 Essential (primary) hypertension: Secondary | ICD-10-CM | POA: Diagnosis not present

## 2017-05-04 DIAGNOSIS — E46 Unspecified protein-calorie malnutrition: Secondary | ICD-10-CM | POA: Diagnosis not present

## 2017-05-05 NOTE — Anesthesia Postprocedure Evaluation (Signed)
Anesthesia Post Note  Patient: Gabriel Poole  Procedure(s) Performed: LAPAROSCOPY DIAGNOSTIC ERAS PATHWAY (N/A Abdomen) PARTIAL GASTRECTOMY (N/A Abdomen) JEJUNAL FEEDING TUBE PLACEMENT (N/A Abdomen)     Patient location during evaluation: PACU Anesthesia Type: Epidural and General Level of consciousness: awake and alert Pain management: pain level controlled Vital Signs Assessment: post-procedure vital signs reviewed and stable Respiratory status: spontaneous breathing, nonlabored ventilation, respiratory function stable and patient connected to nasal cannula oxygen Cardiovascular status: blood pressure returned to baseline and stable Postop Assessment: no apparent nausea or vomiting Anesthetic complications: no    Last Vitals:  Vitals:   04/27/17 2220 04/28/17 0544  BP: 140/69 136/69  Pulse: 71 85  Resp: 17 17  Temp: 36.7 C 36.8 C  SpO2: 99% 97%    Last Pain:  Vitals:   04/28/17 0834  TempSrc:   PainSc: 0-No pain                 Venola Castello

## 2017-05-06 DIAGNOSIS — F17200 Nicotine dependence, unspecified, uncomplicated: Secondary | ICD-10-CM | POA: Diagnosis not present

## 2017-05-06 DIAGNOSIS — Z903 Acquired absence of stomach [part of]: Secondary | ICD-10-CM | POA: Diagnosis not present

## 2017-05-06 DIAGNOSIS — Z434 Encounter for attention to other artificial openings of digestive tract: Secondary | ICD-10-CM | POA: Diagnosis not present

## 2017-05-06 DIAGNOSIS — Z483 Aftercare following surgery for neoplasm: Secondary | ICD-10-CM | POA: Diagnosis not present

## 2017-05-06 DIAGNOSIS — I10 Essential (primary) hypertension: Secondary | ICD-10-CM | POA: Diagnosis not present

## 2017-05-06 DIAGNOSIS — C163 Malignant neoplasm of pyloric antrum: Secondary | ICD-10-CM | POA: Diagnosis not present

## 2017-05-06 DIAGNOSIS — E785 Hyperlipidemia, unspecified: Secondary | ICD-10-CM | POA: Diagnosis not present

## 2017-05-06 DIAGNOSIS — C162 Malignant neoplasm of body of stomach: Secondary | ICD-10-CM | POA: Diagnosis not present

## 2017-05-06 DIAGNOSIS — E46 Unspecified protein-calorie malnutrition: Secondary | ICD-10-CM | POA: Diagnosis not present

## 2017-05-07 ENCOUNTER — Telehealth: Payer: Self-pay | Admitting: Nurse Practitioner

## 2017-05-07 NOTE — Telephone Encounter (Signed)
Unable to reach patient so I called Gabriel Poole alt contact and gave her all info for appointment

## 2017-05-13 DIAGNOSIS — C163 Malignant neoplasm of pyloric antrum: Secondary | ICD-10-CM | POA: Diagnosis not present

## 2017-05-13 DIAGNOSIS — Z483 Aftercare following surgery for neoplasm: Secondary | ICD-10-CM | POA: Diagnosis not present

## 2017-05-13 DIAGNOSIS — Z434 Encounter for attention to other artificial openings of digestive tract: Secondary | ICD-10-CM | POA: Diagnosis not present

## 2017-05-13 DIAGNOSIS — F17200 Nicotine dependence, unspecified, uncomplicated: Secondary | ICD-10-CM | POA: Diagnosis not present

## 2017-05-13 DIAGNOSIS — I10 Essential (primary) hypertension: Secondary | ICD-10-CM | POA: Diagnosis not present

## 2017-05-13 DIAGNOSIS — E785 Hyperlipidemia, unspecified: Secondary | ICD-10-CM | POA: Diagnosis not present

## 2017-05-13 DIAGNOSIS — Z903 Acquired absence of stomach [part of]: Secondary | ICD-10-CM | POA: Diagnosis not present

## 2017-05-13 DIAGNOSIS — C162 Malignant neoplasm of body of stomach: Secondary | ICD-10-CM | POA: Diagnosis not present

## 2017-05-13 DIAGNOSIS — E46 Unspecified protein-calorie malnutrition: Secondary | ICD-10-CM | POA: Diagnosis not present

## 2017-05-14 NOTE — Progress Notes (Signed)
  Oncology Nurse Navigator Documentation  Navigator Location: [facility] (05/14/17 1216) Referral date to RadOnc/MedOnc: 05/05/17 (05/14/17 1216) )Navigator Encounter Type: Introductory phone call (05/14/17 1216)   Abnormal Finding Date: 02/17/17 (05/14/17 1216) Confirmed Diagnosis Date: 02/19/17 (05/14/17 1216) Surgery Date: 04/22/17 (05/14/17 1216)           Treatment Initiated Date: 04/22/17 (05/14/17 1216)   Treatment Phase: Pre-Tx/Tx Discussion (05/14/17 1216) Barriers/Navigation Needs: Coordination of Care (05/14/17 1216)   Interventions: Coordination of Care;Psycho-social support (05/14/17 1216)   Coordination of Care: Appts (05/14/17 1216)   Called patient to introduce myself and my role as GI Oncology navigator. Patient was not aware of his appointment on 05/17/17 with Cira Rue NP. Patient verbalized that he will beat the [facility] at 1:30 PM for his 2 PM appointment. Patient encouraged to call with questions or concerns.     Acuity: Level 2 (05/14/17 1216)         Time Spent with Patient: 15 (05/14/17 1216)

## 2017-05-17 ENCOUNTER — Encounter: Payer: Self-pay | Admitting: Nurse Practitioner

## 2017-05-17 ENCOUNTER — Inpatient Hospital Stay: Payer: Medicare HMO | Attending: Nurse Practitioner | Admitting: Nurse Practitioner

## 2017-05-17 ENCOUNTER — Telehealth: Payer: Self-pay | Admitting: Nurse Practitioner

## 2017-05-17 VITALS — BP 148/83 | HR 64 | Temp 97.7°F | Resp 20 | Ht 70.0 in | Wt 154.9 lb

## 2017-05-17 DIAGNOSIS — M109 Gout, unspecified: Secondary | ICD-10-CM | POA: Diagnosis not present

## 2017-05-17 DIAGNOSIS — C162 Malignant neoplasm of body of stomach: Secondary | ICD-10-CM

## 2017-05-17 DIAGNOSIS — I10 Essential (primary) hypertension: Secondary | ICD-10-CM | POA: Diagnosis not present

## 2017-05-17 DIAGNOSIS — Z8711 Personal history of peptic ulcer disease: Secondary | ICD-10-CM | POA: Diagnosis not present

## 2017-05-17 DIAGNOSIS — R918 Other nonspecific abnormal finding of lung field: Secondary | ICD-10-CM | POA: Insufficient documentation

## 2017-05-17 DIAGNOSIS — Z5111 Encounter for antineoplastic chemotherapy: Secondary | ICD-10-CM | POA: Diagnosis not present

## 2017-05-17 DIAGNOSIS — Z87891 Personal history of nicotine dependence: Secondary | ICD-10-CM | POA: Diagnosis not present

## 2017-05-17 DIAGNOSIS — R339 Retention of urine, unspecified: Secondary | ICD-10-CM | POA: Insufficient documentation

## 2017-05-17 DIAGNOSIS — C779 Secondary and unspecified malignant neoplasm of lymph node, unspecified: Secondary | ICD-10-CM | POA: Diagnosis not present

## 2017-05-17 DIAGNOSIS — M1990 Unspecified osteoarthritis, unspecified site: Secondary | ICD-10-CM | POA: Insufficient documentation

## 2017-05-17 DIAGNOSIS — E876 Hypokalemia: Secondary | ICD-10-CM | POA: Insufficient documentation

## 2017-05-17 DIAGNOSIS — Z7982 Long term (current) use of aspirin: Secondary | ICD-10-CM | POA: Diagnosis not present

## 2017-05-17 DIAGNOSIS — C163 Malignant neoplasm of pyloric antrum: Secondary | ICD-10-CM | POA: Diagnosis not present

## 2017-05-17 DIAGNOSIS — Z79899 Other long term (current) drug therapy: Secondary | ICD-10-CM | POA: Insufficient documentation

## 2017-05-17 DIAGNOSIS — D649 Anemia, unspecified: Secondary | ICD-10-CM | POA: Diagnosis not present

## 2017-05-17 DIAGNOSIS — E785 Hyperlipidemia, unspecified: Secondary | ICD-10-CM | POA: Insufficient documentation

## 2017-05-17 MED ORDER — OXYCODONE HCL 5 MG PO TABS: 5.0000 mg | ORAL_TABLET | ORAL | 0 refills | Status: DC | PRN

## 2017-05-17 NOTE — Telephone Encounter (Signed)
Scheduled appt per 1/7 los - Gave patient AVS and calender per los.  

## 2017-05-17 NOTE — Progress Notes (Signed)
Met with patient during initial med/onc appointment. I provided my contact information and encouraged patient to call with questions or concerns.

## 2017-05-17 NOTE — Progress Notes (Addendum)
[place]  [phone] Fax:([phone] [place] Note   Patient Care Team: Octavio Graves, DO as PCP - General Truitt Merle, MD as Consulting Physician (Hematology) Alla Feeling, NP as Nurse Practitioner (Nurse Practitioner) Stark Klein, MD as Consulting Physician (General Surgery) Rogene Houston, MD as Consulting Physician (Gastroenterology) Milus Banister, MD as Attending Physician (Gastroenterology) 05/17/2017  CHIEF COMPLAINTS/PURPOSE OF CONSULTATION:  Primary adenocarcinoma of the pyloric antrum     Malignant neoplasm of body of stomach ([place])   02/17/2017 Initial Diagnosis    Malignant neoplasm of body of stomach ([place])      02/17/2017 Pathology Results    Diagnosis Stomach, biopsy, gastric ulcer - ADENOCARCINOMA. Microscopic Comment Several of the fragments are involved by moderately differentiated adenocarcinoma.      02/17/2017 Procedure    UPPER ENDOSCOPY  FINDINGS - Normal esophagus. - Z-line irregular, 44 cm from the incisors. - Red blood in the gastric body and in the gastric antrum. - Large gastric ulcer. Biopsied. - Erythematous mucosa in the antrum. - Normal cardia, gastric fundus, gastric body and pylorus. - Normal duodenal bulb and second portion of the duodenum. Comment: Endoscopic appearence concerning for malignant ulcer.      03/11/2017 Procedure    UPPER EUS PER DR. Ardis Hughs Findings: 1. The esophagus was normal. 2. There was a 2-3cm ulcerated, malignant mass along the greater curvature of the stomach, approximately mid-body. The mass was non-circumferential. 3. The duodenum was normal. Endosonographic Finding 1. The gastric mass above correlated with a hypoechoic and heterogenous non-circumferential mass that measured 2.9cm across, 28mm deep. The endosonographic borders were poorly defined and there was clear sonographic evidence suggesting invasion into and through the muscularis propria layer  without evident invasion into nearby organs (uT3). 2. The duodenal lymphnode described on recent CT scan appeared reactive by [place] criteria. 3. No perigastric adenopathy (uN0) 4. Limited views of the liver, spleen, pancreas, bile duct, gallbladder were all normal. - Along the greater curvature of the stomach, approximately mid-body, there is a 2.9cm uT3N0 (clinical stage IIB) gastric adenocarcinoma.      04/06/2017 Imaging    CT CHEST IMPRESSION: 1. No acute cardiopulmonary abnormalities. 2. Two small pulmonary nodules are noted measuring up to 5 mm. Nonspecific but warrant attention on follow-up imaging follow up 3. Nonspecific hyperdense, possibly enhancing lesion along the dome of liver is identified. In a patient that is at increased risk for more definitive assessment of this structure with contrast enhanced MRI of the liver is advised.      04/21/2017 Imaging    Gabriel ABDOMEN IMPRESSION: 1. Enhancing 2.9 cm mass along the lesser curvature in the gastric antrum, compatible with known gastric malignancy . 2. Mildly enlarged gastrohepatic ligament lymph node, cannot exclude nodal metastasis. 3. No definite liver metastatic disease. Hyperenhancing 0.9 cm liver dome mass remains inconclusive, although the MRI features are most suggestive of a flash filling hemangioma, and the mass has been stable for nearly 2 months. Additional subcentimeter focus of hyperenhancement in the lateral segment left liver lobe is most likely a benign transient vascular phenomenon. Recommend attention to these lesions on a follow-up MRI abdomen without and with IV contrast in 3-6 months. This recommendation follows ACR consensus guidelines: Management of Incidental Liver Lesions on CT: A White Paper of the ACR Incidental Findings Committee. J Am Coll Radiol 2017; 35:5732-2025. 4. Benign right adrenal adenoma.        04/22/2017 Pathology Results    Diagnosis 1.  Liver, biopsy - BILE DUCT  HAMARTOMA. - THERE IS NO EVIDENCE OF MALIGNANCY. 2. Lymph nodes, regional resection, portal - METASTATIC CARCINOMA IN 2 OF 6 LYMPH NODES (2/6). 3. Stomach, resection for tumor, distal - INVASIVE ADENOCARCINOMA, POORLY DIFFERENTIATED, SPANNING 3.8 CM. - PERINEURAL INVASION IS IDENTIFIED. - ADENOCARCINOMA INVOLVES THE SEROSA. - METASTATIC CARCINOMA IN 12 OF 30 LYMPH NODES (12/30), WITH EXTRACAPSULAR EXTENSION. - SEE ONCOLOGY TABLE BELOW. 4. Lymph node, biopsy, common hepatic artery - THERE IS NO EVIDENCE OF CARCINOMA IN 1 OF 1 LYMPH NODE (0/1). Microscopic Comment 3. STOMACH: Specimen: Stomach. Procedure: Partial gastrectomy. Tumor Site: Greater curvature. Tumor Size: 3.8 cm Histologic Type: Adenocarcinoma. Histologic Grade: G3: poorly differentiated. Microscopic Extent of Tumor: Adenocarcinoma involves the serosa. Margins (select all that apply): Adenoca Proximal Margin: Negative for adenocarcinoma. Distal Margin: Negative for adenocarcinoma. Treatment Effect: N/A Lymph-Vascular Invasion: Not identified. Perineural Invasion: Present. Additional findings: Chronic gastritis. Ancillary testing: Can be performed upon clinician request. 1 of 3 Duplicate copy FINAL for Gabriel Poole, Gabriel Poole ([id number]) Microscopic Comment(continued) Lymph nodes: number examined - 64; number positive: 14 Pathologic Staging: pT4a, pN3a (JBK:gt, 04/27/17)       HISTORY OF PRESENTING ILLNESS:  Gabriel Poole 72 y.o. male is here because of newly diagnosed gastric cancer. Initially, he began having symptoms of bilateral abdominal pain, bloating, and decreased appetite in 07/2016 that persisted for 1.5 months before seeking care with PCP. He reports 40 pounds weight loss over 4-5 months. Denies nausea, vomiting, constipation, diarrhea, GI bleeding, or early satiety. Then referred to gastroenterologist in [place]. Work up for gall stones was negative. He had good performance status and working at  [employer]. EGD by Dr. Laural Golden noted ulcerated mass seen on the greater curvature. CT [facility] showed known malignancy in the antral region of the stomach and a single lymph node adjacent to the duodenal bulb. He was referred to Dr. Ardis Hughs for EUS which confirmed gastric mass spanning 2.9 cm across and 7 mm deep suggesting invasion into and through the muscularis propria without evidence of invasion into nearby organs (uT3); lymph node on CT appeared to be reactive (uN0).     He was referred to Dr. Barry Dienes for surgery consult. CT chest completed staging work up which indicated two non-specific pulmonary nodules and a non-specific, hyperdense, possibly enhancing lesion along the dome of the liver. F/u Gabriel abdomen revealed no definitive liver metastasis, the hyperenhancing lesion is thought to be flash filling hemangioma. He then underwent laparoscopic diagnostic ERAS pathway with partial gastrectomy and jejunal feeding tube placement on 04/25/17. Surgical pathology revealed poorly differentiated adenocarcinoma of the stomach, spanning 3.8cm with perineural invasion and with involvement of the serosa. 12/30 lymph nodes were positive for metastatic carcinoma with extracapsular extension. 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 One lymph node was also surveyed along the common hepatic artery which was negative for carcinoma. He was then referred to oncology for further management.   In the past he was diagnosed with arthritis, heart murmur, HTN, hyperlipidemia, gout, hydrocele, and elevated PSA. Previous colonoscopy in 2015 was positive for sessile polyp but otherwise unremarkable and he is on 5-year follow up plan. He drinks one alcoholic drink daily. He smoked 0.5 PPD for 45 years, quit smoking before surgery 04/2017. Family history is positive for 2 maternal aunts diagnosed with leukemia. He is divorced and  re-married, has 3 children who are healthy. He is accompanied by his ex-wife today.  Since his surgery he has recovered well. He uses J-tube occasionally but feeds make him nauseous; gets most nutrition and hydration by mouth. He last flushed tube 2 days ago, he has 8/10 left abdominal pain and is asking for refill of pain medication today, often increases to 8/10 at night. BMs are regular, no n/v. He is able to care for himself and complete ADLs independently. He does not drive, but has family who can transport him for appointments, he lives in [place], approx 30 minutes from [place].    MEDICAL HISTORY:  Past Medical History:  Diagnosis Date  . Arthritis   . Cancer [facility]) dx oct 02-2017   stomach  . Gout   . Heart murmur   . Hyperlipidemia   . Hypertension   . Stomach cancer [facility])     SURGICAL HISTORY: Past Surgical History:  Procedure Laterality Date  . COLONOSCOPY    . ESOPHAGOGASTRODUODENOSCOPY N/A 02/17/2017   Procedure: ESOPHAGOGASTRODUODENOSCOPY (EGD);  Surgeon: Rogene Houston, MD;  [facility];  Service: Endoscopy;  Laterality: N/A;  3:00  . EUS N/A 03/11/2017   Procedure: UPPER ENDOSCOPIC ULTRASOUND (EUS) LINEAR;  Surgeon: Milus Banister, MD;  Location: [facility] ENDOSCOPY;  Service: Endoscopy;  Laterality: N/A;  . EUS N/A 03/11/2017   Procedure: UPPER ENDOSCOPIC ULTRASOUND (EUS) RADIAL;  Surgeon: Milus Banister, MD;  Location: [facility] ENDOSCOPY;  Service: Endoscopy;  Laterality: N/A;  . FINGER SURGERY     Lt middle   . GASTRECTOMY N/A 04/22/2017   Procedure: PARTIAL GASTRECTOMY;  Surgeon: Stark Klein, MD;  Location: [place];  Service: General;  Laterality: N/A;  . GASTROJEJUNOSTOMY N/A 04/22/2017   Procedure: JEJUNAL FEEDING TUBE PLACEMENT;  Surgeon: Stark Klein, MD;  Location: [place];  Service: General;  Laterality: N/A;  . HYDROCELE EXCISION  02/12/2011   Procedure: HYDROCELECTOMY ADULT;  Surgeon: Marissa Nestle;  Location: [facility] ORS;  Service: Urology;  Laterality:  Left;  . LAPAROSCOPY N/A 04/22/2017   Procedure: LAPAROSCOPY DIAGNOSTIC ERAS PATHWAY;  Surgeon: Stark Klein, MD;  Location: [place];  Service: General;  Laterality: N/A;  EPIDURAL    SOCIAL HISTORY: Social History   Socioeconomic History  . Marital status: Divorced    Spouse name: Not on file  . Number of children: Not on file  . Years of education: Not on file  . Highest education level: Not on file  Social Needs  . Financial resource strain: Not on file  . Food insecurity - worry: Not on file  . Food insecurity - inability: Not on file  . Transportation needs - medical: Not on file  . Transportation needs - non-medical: Not on file  Occupational History  . Occupation: works at [employer]  . Smoking status: Former Smoker    Packs/day: 0.50    Years: 45.00    Pack years: 22.50    Types: Cigarettes, Cigars    Last attempt to quit: 04/2017    Years since quitting: 0.1  . Smokeless tobacco: Never Used  . Tobacco comment: qiot cigarettes 3-4 weeks ago has occ cigar  Substance and Sexual Activity  . Alcohol use: Yes    Alcohol/week: 0.0 oz    Comment: Brandy daily  . Drug use: No  . Sexual activity: Yes  Other Topics Concern  . Not on file  Social History Narrative  . Not on file    FAMILY HISTORY: Family History  Problem Relation Age of Onset  . Hypertension Mother   . Heart  attack Father   . Cancer Maternal Aunt        leukemia  . Cancer Maternal Aunt        leukemia  . Hypotension Neg Hx   . Anesthesia problems Neg Hx   . Malignant hyperthermia Neg Hx   . Pseudochol deficiency Neg Hx     ALLERGIES:  is allergic to penicillins; ace inhibitors; and losartan.  MEDICATIONS:  Current Outpatient Medications  Medication Sig Dispense Refill  . amLODipine (NORVASC) 10 MG tablet Take 1 tablet (10 mg total) by mouth daily. 90 tablet 0  . aspirin EC 81 MG tablet Take 81 mg by mouth daily.    Marland Kitchen atorvastatin (LIPITOR) 40 MG tablet Take 1 tablet (40 mg  total) by mouth daily at 6 PM. 90 tablet 0  . Cholecalciferol (VITAMIN D3) 5000 units CAPS Take 5,000 Units by mouth daily.    . metoprolol tartrate (LOPRESSOR) 50 MG tablet Take 50 mg by mouth 2 (two) times daily.    Marland Kitchen oxyCODONE (OXY IR/ROXICODONE) 5 MG immediate release tablet Take 1-2 tablets (5-10 mg total) by mouth every 4 (four) hours as needed for moderate pain, severe pain or breakthrough pain. 25 tablet 0  . pantoprazole (PROTONIX) 40 MG tablet Take 1 tablet (40 mg total) by mouth 2 (two) times daily before a meal. 60 tablet 4  . sucralfate (CARAFATE) 1 g tablet Take 1 g by mouth 2 (two) times daily.     No current facility-administered medications for this visit.     REVIEW OF SYSTEMS:   Constitutional: Denies fatigue fevers, chills or abnormal night sweats (+) 4-5 month history of weight loss, 40 lbs total  Eyes: Denies blurriness of vision, double vision or watery eyes Ears, nose, mouth, throat, and face: Denies mucositis or sore throat (+) missing some teeth, wants to have extractions and get dentures prior to treatment Respiratory: Denies cough, dyspnea or wheezes Cardiovascular: Denies palpitation, chest discomfort or lower extremity swelling Gastrointestinal:  Denies vomiting, constipation, diarrhea, heartburn or change in bowel habits (+) nausea secondary to J tube feeds (+) left abd pain, 8/10 Skin: Denies abnormal skin rashes Lymphatics: Denies new lymphadenopathy or easy bruising Neurological:Denies numbness, tingling or new weaknesses Behavioral/Psych: Mood is stable, no new changes  All other systems were reviewed with the patient and are negative.  PHYSICAL EXAMINATION: ECOG PERFORMANCE STATUS: 2 - Symptomatic, <50% confined to bed  Vitals:   05/17/17 1432  BP: (!) 148/83  Pulse: 64  Resp: 20  Temp: 97.7 F (36.5 C)  SpO2: 100%   Filed Weights   05/17/17 1432  Weight: 154 lb 14.4 oz (70.3 kg)    GENERAL:alert, no distress and comfortable SKIN: skin  color, texture, turgor are normal, no rashes or significant lesions EYES: normal, conjunctiva are pink and non-injected, sclera clear OROPHARYNX:no exudate, no erythema and lips, buccal mucosa, and tongue normal (+) missing teeth  NECK: supple, thyroid normal size, non-tender, without nodularity LYMPH:  no palpable cervical, supraclavicular, axillary, or inguinal lymphadenopathy  LUNGS: clear to auscultation bilaterally with normal breathing effort HEART: regular rate & rhythm and no murmurs and no lower extremity edema ABDOMEN:abdomen soft, non-tender and normal bowel sounds (+) left abdomen J tube (+) vertical midline incision surgical scar is well healed Musculoskeletal:no cyanosis of digits and no clubbing  PSYCH: alert & oriented x 3 with fluent speech NEURO: no focal motor/sensory deficits  LABORATORY DATA:  I have reviewed the data as listed CBC Latest Ref Rng & Units 04/27/2017  04/26/2017 04/25/2017  WBC 4.0 - 10.5 K/uL 13.0(H) 14.9(H) 16.2(H)  Hemoglobin 13.0 - 17.0 g/dL 11.2(L) 11.5(L) 11.4(L)  Hematocrit 39.0 - 52.0 % 34.2(L) 35.6(L) 34.8(L)  Platelets 150 - 400 K/uL 265 256 216    CMP Latest Ref Rng & Units 04/27/2017 04/26/2017 04/25/2017  Glucose 65 - 99 mg/dL 114(H) 122(H) 106(H)  BUN 6 - 20 mg/dL 17 13 11   Creatinine 0.61 - 1.24 mg/dL 0.96 0.95 1.03  Sodium 135 - 145 mmol/L 136 134(L) 133(L)  Potassium 3.5 - 5.1 mmol/L 3.5 3.5 3.7  Chloride 101 - 111 mmol/L 104 101 100(L)  CO2 22 - 32 mmol/L 23 24 25   Calcium 8.9 - 10.3 mg/dL 9.1 9.0 8.8(L)  Total Protein 6.5 - 8.1 g/dL - - -  Total Bilirubin 0.3 - 1.2 mg/dL - - -  Alkaline Phos 38 - 126 U/L - - -  AST 15 - 41 U/L - - -  ALT 17 - 63 U/L - - -    RADIOGRAPHIC STUDIES: I have personally reviewed the radiological images as listed and agreed with the findings in the report. Gabriel Abdomen Wwo Contrast  Result Date: 04/22/2017 CLINICAL DATA:  Gastric antral adenocarcinoma diagnosed October 2018. Indeterminate  hyperenhancing liver dome lesion on chest CT staging study. EXAM: MRI ABDOMEN WITHOUT AND WITH CONTRAST TECHNIQUE: Multiplanar multisequence Gabriel imaging of the abdomen was performed both before and after the administration of intravenous contrast. CONTRAST:  54mL MULTIHANCE GADOBENATE DIMEGLUMINE 529 MG/ML IV SOLN COMPARISON:  04/05/2017 chest CT.  02/25/2017 CT abdomen/ pelvis. FINDINGS: Lower chest: No acute abnormality at the lung bases . Hepatobiliary: Normal liver size and configuration. No hepatic steatosis. There is a mildly T2 hyperintense 0.9 cm liver dome mass (series 3/image 3), which demonstrates arterial phase hyperenhancement and persistent slight hyperenhancement on the portal venous and delayed phase postcontrast sequences, stable back to 02/25/2017 CT abdomen study. There is a subcentimeter focus of arterial phase hyperenhancement in the lateral segment left liver lobe (series 1001/ image 26), which is occult on all other sequences, suggestive of a benign transient hepatic attenuation difference. There are scattered simple subcentimeter liver cysts in the inferior right liver lobe and lateral segment left liver lobe. No additional liver lesions. Normal gallbladder with no cholelithiasis. No biliary ductal dilatation. Common bile duct diameter 4 mm. No choledocholithiasis. Pancreas: No pancreatic mass or duct dilation.  No pancreas divisum. Spleen: Normal size. No mass. Adrenals/Urinary Tract: There is a 1.2 cm right adrenal nodule (series 9/ image 26) with significant signal intensity loss on out of phase chemical shift imaging compatible with a benign right adrenal adenoma. No discrete left adrenal nodules. No hydronephrosis. Multiple simple renal cysts bilaterally, largest 2.5 cm in the posterior lower right kidney and 5.2 cm in the posterior lower left kidney. No suspicious renal masses. Stomach/Bowel: There is an enhancing 2.9 x 2.4 cm mass along the lesser curvature in the gastric antrum (series  1005/ image 37). Stomach is mildly distended with fluid and debris. Visualized small and large bowel is normal caliber, with no bowel wall thickening. Vascular/Lymphatic: Atherosclerotic nonaneurysmal abdominal aorta. Patent portal, splenic, hepatic and renal veins. Mildly enlarged 1.0 cm node in the gastrohepatic ligament (series 1003/ image 41). Other: No abdominal ascites or focal fluid collection. Musculoskeletal: No aggressive appearing focal osseous lesions. Mildly expansile left posterior ninth rib osseous lesion (series 3/image 8) is stable since 02/25/2017 CT abdomen study and appears stable on chest radiographs since 12/17/2010, most compatible with a benign lesion such  as fibrous dysplasia. IMPRESSION: 1. Enhancing 2.9 cm mass along the lesser curvature in the gastric antrum, compatible with known gastric malignancy . 2. Mildly enlarged gastrohepatic ligament lymph node, cannot exclude nodal metastasis. 3. No definite liver metastatic disease. Hyperenhancing 0.9 cm liver dome mass remains inconclusive, although the MRI features are most suggestive of a flash filling hemangioma, and the mass has been stable for nearly 2 months. Additional subcentimeter focus of hyperenhancement in the lateral segment left liver lobe is most likely a benign transient vascular phenomenon. Recommend attention to these lesions on a follow-up MRI abdomen without and with IV contrast in 3-6 months. This recommendation follows ACR consensus guidelines: Management of Incidental Liver Lesions on CT: A White Paper of the ACR Incidental Findings Committee. J Am Coll Radiol 2017; 07:3710-6269. 4. Benign right adrenal adenoma. Electronically Signed   By: Ilona Sorrel M.D.   On: 04/22/2017 08:23    CT CHEST 04/06/17 IMPRESSION: 1. No acute cardiopulmonary abnormalities. 2. Two small pulmonary nodules are noted measuring up to 5 mm. Nonspecific but warrant attention on follow-up imaging follow up 3. Nonspecific hyperdense,  possibly enhancing lesion along the dome of liver is identified. In a patient that is at increased risk for more definitive assessment of this structure with contrast enhanced MRI of the liver is advised.  Gabriel ABDOMEN 04/21/17 IMPRESSION: 1. Enhancing 2.9 cm mass along the lesser curvature in the gastric antrum, compatible with known gastric malignancy . 2. Mildly enlarged gastrohepatic ligament lymph node, cannot exclude nodal metastasis. 3. No definite liver metastatic disease. Hyperenhancing 0.9 cm liver dome mass remains inconclusive, although the MRI features are most suggestive of a flash filling hemangioma, and the mass has been stable for nearly 2 months. Additional subcentimeter focus of hyperenhancement in the lateral segment left liver lobe is most likely a benign transient vascular phenomenon. Recommend attention to these lesions on a follow-up MRI abdomen without and with IV contrast in 3-6 months. This recommendation follows ACR consensus guidelines: Management of Incidental Liver Lesions on CT: A White Paper of the ACR Incidental Findings Committee. J Am Coll Radiol 2017; 48:5462-7035. 4. Benign right adrenal adenoma  PATHOLOGY  Diagnosis 02/17/17 Stomach, biopsy, gastric ulcer - ADENOCARCINOMA. Microscopic Comment Several of the fragments are involved by moderately differentiated adenocarcinoma.  Diagnosis 04/22/17 1. Liver, biopsy - BILE DUCT HAMARTOMA. - THERE IS NO EVIDENCE OF MALIGNANCY. 2. Lymph nodes, regional resection, portal - METASTATIC CARCINOMA IN 2 OF 6 LYMPH NODES (2/6). 3. Stomach, resection for tumor, distal - INVASIVE ADENOCARCINOMA, POORLY DIFFERENTIATED, SPANNING 3.8 CM. - PERINEURAL INVASION IS IDENTIFIED. - ADENOCARCINOMA INVOLVES THE SEROSA. - METASTATIC CARCINOMA IN 12 OF 30 LYMPH NODES (12/30), WITH EXTRACAPSULAR EXTENSION. - SEE ONCOLOGY TABLE BELOW. 4. Lymph node, biopsy, common hepatic artery - THERE IS NO EVIDENCE OF CARCINOMA IN  1 OF 1 LYMPH NODE (0/1). Microscopic Comment 3. STOMACH: Specimen: Stomach. Procedure: Partial gastrectomy. Tumor Site: Greater curvature. Tumor Size: 3.8 cm Histologic Type: Adenocarcinoma. Histologic Grade: G3: poorly differentiated. Microscopic Extent of Tumor: Adenocarcinoma involves the serosa. Margins (select all that apply): Adenoca Proximal Margin: Negative for adenocarcinoma. Distal Margin: Negative for adenocarcinoma. Treatment Effect: N/A Lymph-Vascular Invasion: Not identified. Perineural Invasion: Present. Additional findings: Chronic gastritis. Ancillary testing: Can be performed upon clinician request. 1 of 3 Duplicate copy FINAL for Gabriel Poole, Gabriel Poole ([id number]) Microscopic Comment(continued) Lymph nodes: number examined - 37; number positive: 14 Pathologic Staging: pT4a, pN3a (JBK:gt, 04/27/17)  ASSESSMENT & PLAN:  This is a wonderful 72 y.o. male who  presents into the clinic today to discuss the following:   1. Primary adenocarcinoma of the pyloric antrum, pT4a, PN3a, Grade 3, stage IIIB -We reviewed imaging and surgical pathology with the patient and family in detail  -He has several high risk features and has locally advanced disease, he will need adjuvant therapy which will include chemotherapy and likely chemoradiation   -Will discuss his case in GI tumor board; will speak with Dr Barry Dienes to determine the nature of lymph node removal. If done in D2 vs D1 fashion, he will not likely need radiation -His tumor did invade through the colon and the wall of the stomach, Dr. Burr Medico recommends adjuvant  for his cancer is sandwich therapy with chemotherapy followed by chemoradiation, followed by more chemotherapy for a total of six months. The goal of therapy is curative.  -Dr. Burr Medico recommends 5FU-based therapy vs Xeloda, side effects of fatigue, nausea, diarrhea, decreased appetite, skin hyperpigmentation changes along the hands and feet, and bone marrow toxicities were  discussed in great detail with the patient.  -He prefers to try oral chemo first, he understands that if side effects are too significant, he would be switched to IV chemo and would need PAC.   -After tumor board discussion will refer him to radiation oncology if needed.  -In the interim I have advised him to focus on recovery; plan to begin adjuvant therapy in 2-3 weeks. If Xeloda co pay is too high, will start IV chemo -I refilled oxycodone today -He will attend chemo class next week -Referred to Jennet Maduro, [facility] dietician to assist with J tube nutrition -He will follow up with Dr. Barry Dienes 1/14.    PLAN: -Refilled oxycodone -Referral to dietician for J tube nutrition consult, apt with Jennet Maduro 1/8 -Prescribe Xeloda, if copy is too high will begin 5FU therapy -F/u Dr. Barry Dienes 1/14 -Lab, f/u [facility] Anastos NP 1/23 prior to beginning adjuvant chemotherapy   Orders Placed This Encounter  Procedures  . CBC with Differential (Cancer Center Only)    Standing Status:   Standing    Number of Occurrences:   50    Standing Expiration Date:   05/18/2023  . CMP ([place] only)    Standing Status:   Standing    Number of Occurrences:   50    Standing Expiration Date:   05/18/2023  . Ambulatory referral to Nutrition and Diabetic E    Referral Priority:   Routine    Referral Type:   Consultation    Referral Reason:   Specialty Services Required    Number of Visits Requested:   1   All questions were answered. The patient knows to call the clinic with any problems, questions or concerns. I spent 55 minutes counseling the patient face to face. The total time spent in the appointment was 60 minutes and more than 50% was on counseling.     Alla Feeling, NP 05/17/2017 9:57 PM   Addendum  I have seen the patient, examined him. I agree with the assessment and and plan and have edited the notes.   Gabriel Poole is a 72 yo male with recently diagnosed distal gastric adenocarcinoma, [id number], stage IIIB, S/P  partial gastrectomy with negative surgical margins.  12 out of 30 of local lymph nodes were positive, biopsy of the portal and common hepatic artery node were negative.  We discussed very high risk of recurrence given his locally advanced disease.  He did not have neoadjuvant chemotherapy.  He is recovering well  from surgery.  I recommend adjuvant 5-fu or capecitabine chemo with chemoRT in the middle, per NCCN guideline, if this is D1 node dissection.  Will discussed this in the tumor board this week, and make referral to rad/oncif needed after discussion. If he had D2 node dissection, he may only need adjuvant chemo.  Benefit and side effects of adjuvant chemotherapy, he voiced good understanding, and agrees to proceed.  He strongly prefers Xeloda due to the convenience.  Although he is 71 year old, he is relatively fit and recovering well from surgery, would be a candidate for chemo and radiation.   Plan to start in about 2 weeks  Truitt Merle  05/17/2017  Addendum  I have discussed his case in GI tumor board this morning. DrBarry Dienes confirmed that he had D2 node dissection. Margins were negative.  Based on [place] and guidelines, tumor board recommend adjuvant chemotherapy CAPOX every 3 weeks or FOLFOX every 2 weeks for a total of 6 months, if he can tolerate. Given his advanced age, he likely will tolerate FOLFOX better. I recommend FOLFOX. No adjuvant radiation is recommended due to the D2 nodal dissection. We have spoken with pt, and he agrees with the plan. Plan to start in about 2 weeks.  Truitt Merle  05/19/2017

## 2017-05-18 ENCOUNTER — Inpatient Hospital Stay: Payer: Medicare HMO

## 2017-05-18 ENCOUNTER — Encounter: Payer: Self-pay | Admitting: Nurse Practitioner

## 2017-05-18 MED ORDER — ENSURE PLUS PO LIQD: ORAL | 0 refills | Status: DC

## 2017-05-18 NOTE — Progress Notes (Signed)
Nutrition Assessment   Reason for Assessment:   Patient with new J-tube.  ASSESSMENT:  72 year old male with gastric cancer.  Patient s/p partial gastrectomy and placement of J-tube on 04/22/17.  History of HTN, HLD, gout, elevated PSA.  Planning to start xeloda, oral chemotherapy and if side effects are too significant will start IV chemotherapy.    Met with patient and ex-wife in clinic this pm.  Patient reports he first tried osmolite 1.5 at home at 5m/hr for 14 hours and was making him "sick".  Reports cramping pain, some nausea, no diarrhea.  Reports that home health decreased rate of tube feeding to 63mhr for 12 hours.  Patient reports still making him "sick".  Reports home health recommended he try ensure plus via tube. Patient stopped using tube feeding unable to tell RD exact date.  Patient reports that orally he is eating. Reports yesterday had cereal and milk, cheeseburger for lunch and few fries and for dinner ate stew beef with onions and green peas.  Reports he had peanut butter nabs and candy as well during the day.  Patient reports pain and food stops at top of stomach.  Noted pain was reported yesterday of 8/10 (abdominal).    Ex-wife spoke up and reported that patient does not want to use feeding tube even though surgeon told patient that he needs the nutrition from the tube feeding.  Ex-wife reports patient is hard headed.    Nutrition Focused Physical Exam: deferred  Medications: reviewed  Labs: reviewed  Anthropometrics:   Height: 70 inches Weight: 154 lb 14.4 oz (noted 40 lb weight loss over the last 4-5 months.  Noted on 12/14 wt of 174 lb (unsure if accurate) and weight on 12/7 162 lb Noted per chart weight of 176 lb in April 2017, 152 lb in  01/2017  BMI: 22  5% weight loss in 1 month, significant   Estimated Energy Needs  Kcals: 2100-2450 calories/d Protein: 105-122 g/d Fluid: 2.0 L/d  NUTRITION DIAGNOSIS: Inadequate oral intake related to cancer,  surgery as evidenced by 5% weight loss in the last month and eating < 75% of energy needs for > or equal to 7 days.  MALNUTRITION DIAGNOSIS: Patient meets criteria for moderate malnutrition in context of acute illness as evidenced by 5% weight loss in 1 month and eating < 75% of energy needs for > or equal to 7 days.    INTERVENTION:   Discussed importance of nutrition and maximizing nutrition both orally and via J-tube at this time to assist in recovery from surgery and with starting chemotherapy. Discussed with patient options of changing tube feeding formula,  rate, length of tube feeding to improve "sick" feeling.   Patient agreeable to trying ensure plus via J-tube at this time for 6066mr for 12 hours, during the night.  Will need to flush tube with 3m65m water before and after stopping feeding.  Patient drinking fluids orally.   Will contact [facility] regarding new tube feeding order.   Encouraged patient to eat small frequent meals Encouraged good sources of protein at meal times. Encouraged patient to drink fluids 30-60 minutes after meal time or before to help with full feeling.    Encouraged patient if he decides not to use J-tube that tube needs to be flushed daily with 3ml37mwater.      MONITORING, EVALUATION, GOAL: Patient will consume adequate calories and protein to prevent weight loss  NEXT VISIT: Tuesday, January 15  [facility]  Cleta Alberts, RD, LDN Registered Dietitian [phone] (pager)

## 2017-05-19 ENCOUNTER — Telehealth: Payer: Self-pay

## 2017-05-19 NOTE — Telephone Encounter (Signed)
Nutrition  Called patient on number provided and patient did not answer and voicemail box not set up.   Patient also left ex-wife's number Gabriel Poole to call as well.   Called and spoke with [name] to inform her that home health will be sending out ensure plus for patient to try via J-tube.  Reviewed instructions with her.  Will plan to meet with patient next week for follow-up. Patient has contact information for myself and Ernestene Kiel  Clorissa Gruenberg B. Zenia Resides, [place], [place] Registered Dietitian [phone] (pager)

## 2017-05-20 ENCOUNTER — Telehealth: Payer: Self-pay | Admitting: Nurse Practitioner

## 2017-05-20 NOTE — Addendum Note (Signed)
Addended by: Truitt Merle on: 05/20/2017 08:40 PM   Modules accepted: Orders

## 2017-05-20 NOTE — Telephone Encounter (Signed)
I called patient to discuss treatment planning. Per Dr. Burr Medico and Dr. Marlowe Aschoff discussion, she performed D2 LN dissection; the patient does not need adjuvant radiation. We reviewed adjuvant chemotherapy regimens FOLFOX vs CAPOX including infusion schedule and potential side effects for both. He now wants to start FOLFOX, he agrees to [facility] placement. Will contact Dr. Barry Dienes to place [facility] soon and hopefully begin chemotherapy in approx 2 weeks. He agrees. Denies other questions or needs at this time.

## 2017-05-22 DIAGNOSIS — R1310 Dysphagia, unspecified: Secondary | ICD-10-CM | POA: Diagnosis not present

## 2017-05-22 DIAGNOSIS — C162 Malignant neoplasm of body of stomach: Secondary | ICD-10-CM | POA: Diagnosis not present

## 2017-05-22 DIAGNOSIS — C163 Malignant neoplasm of pyloric antrum: Secondary | ICD-10-CM | POA: Diagnosis not present

## 2017-05-24 ENCOUNTER — Other Ambulatory Visit: Payer: Self-pay

## 2017-05-24 ENCOUNTER — Other Ambulatory Visit: Payer: Self-pay | Admitting: General Surgery

## 2017-05-24 ENCOUNTER — Encounter (HOSPITAL_BASED_OUTPATIENT_CLINIC_OR_DEPARTMENT_OTHER): Payer: Self-pay | Admitting: *Deleted

## 2017-05-24 NOTE — H&P (Signed)
Thad Ranger Documented: 05/24/2017 9:26 AM Location: [facility] Patient #: [id number] DOB: 20-Apr-1946 Divorced / Language: Gabriel Poole / Race: Black or African American Male   History of Present Illness Stark Klein MD; 05/24/2017 10:12 AM) The patient is a 72 year old male who presents for a follow-up for Gastric cancer. Patient is a 72 year old male referred by Dr. Ardis Hughs with diagnosis of gastric cancer 03/2017. He presented to Dr. Laural Golden with around 4 months of weight loss, epigastric pain, and bloating. He denies any history of GI bleeding or fatigue. He was worked up for gallstones with an ultrasound and that was negative. He had a ulcerated mass seen on the greater curve at EGD by Dr. Laural Golden and he was referred to Dr. Ardis Hughs for endoscopic ultrasound. The mass seemed to be localized with only about a 2-3 cm size. He had no evidence of linitis plastica. He was started on carafate and protonix.   He underwent distal gastrectomy 04/22/2017. He did fairly well. He was discharged on tube feeds and soft diet. Since discharge, he has drunk the tube feed material instead of having it via pump. He is only down 3 pounds from pre op. He has a good appetite. He is having some pain at the incision.   He has seen oncology and chemo is recommended. We are asked to place port for chemotherapy.   Surgical pathology Diagnosis 1. Liver, biopsy - BILE DUCT HAMARTOMA. - THERE IS NO EVIDENCE OF MALIGNANCY. 2. Lymph nodes, regional resection, portal - METASTATIC CARCINOMA IN 2 OF 6 LYMPH NODES (2/6). 3. Stomach, resection for tumor, distal - INVASIVE ADENOCARCINOMA, POORLY DIFFERENTIATED, SPANNING 3.8 CM. - PERINEURAL INVASION IS IDENTIFIED. - ADENOCARCINOMA INVOLVES THE SEROSA. - METASTATIC CARCINOMA IN 12 OF 30 LYMPH NODES (12/30), WITH EXTRACAPSULAR EXTENSION. - SEE ONCOLOGY TABLE BELOW. 4. Lymph node, biopsy, common hepatic artery - THERE IS NO EVIDENCE OF CARCINOMA IN  1 OF 1 LYMPH NODE (0/1).    Allergies (April Staton, CMA; 05/24/2017 9:46 AM) No Known Drug Allergies [03/23/2017]:  Medication History (April Staton, CMA; 05/24/2017 9:47 AM) OxyCODONE HCl (5MG  Tablet, 1 (one) Oral every six hours, as needed for pain, Taken starting 05/06/2017) Active. AmLODIPine Besylate (10MG  Tablet, Oral) Active. Aspirin (81MG  Tablet, Oral) Active. Atorvastatin Calcium (40MG  Tablet, Oral) Active. Cholecalciferol (2000UNIT Tablet, Oral) Active. Metoprolol Tartrate (50MG  Tablet, Oral) Active. Pantoprazole Sodium (40MG  Tablet DR, Oral) Active. Sucralfate (1GM Tablet, Oral) Active. Medications Reconciled    Review of Systems Stark Klein MD; 05/24/2017 9:33 AM) All other systems negative  Vitals (April Staton CMA; 05/24/2017 9:47 AM) 05/24/2017 9:47 AM Weight: 157.5 lb Height: 70in Body Surface Area: 1.89 m Body Mass Index: 22.6 kg/m  Temp.: 98.89F(Oral)  Pulse: 66 (Regular)  P.OX: 97% (Room air) BP: 110/60 (Sitting, Left Arm, Standard)       Physical Exam Stark Klein MD; 05/24/2017 10:13 AM) General Mental Status-Alert. General Appearance-Consistent with stated age. Hydration-Well hydrated. Voice-Normal.  Chest and Lung Exam Chest and lung exam reveals -quiet, even and easy respiratory effort with no use of accessory muscles. Inspection Chest Wall - Normal. Back - normal.  Abdomen Note: soft, non distended. feeding tube in place. small amount of crusting at tube site.     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 Pain meds refilled.  Orders have been written for port. Current Plans Pt Education - ccs port insertion education Follow up with [place] in the office  in 6 WEEKS.  Call us sooner as needed.  Changed OxyCODONE HCl 5 MG Oral Tablet, 1-2 Tablet every six hours, as needed for pain, #30, 05/24/2017, No  Refill.   Signed by Stark Klein, MD (05/24/2017 10:14 AM)

## 2017-05-25 ENCOUNTER — Inpatient Hospital Stay: Payer: Medicare HMO

## 2017-05-25 ENCOUNTER — Encounter: Payer: Self-pay | Admitting: General Practice

## 2017-05-25 ENCOUNTER — Encounter: Payer: Self-pay | Admitting: *Deleted

## 2017-05-25 DIAGNOSIS — C163 Malignant neoplasm of pyloric antrum: Secondary | ICD-10-CM

## 2017-05-25 NOTE — Progress Notes (Signed)
[facility] CSW Progress Notes  Dietician reported that patient anticipates difficulty w transportation for appointments - patient does not drive and current community support (ex-wife) will be unable to transport after 2/25.  CSW contacted insurance company.  Patient does have a transportation rider on his insurance, requests for transport are handled by [employer] [phone]).  Patient must call and arrange, can have up to 12 one way trips/year.  Trips limited to 25 miles.  Patient and ex wife provided w contact information for [employer].  Patient also referred to [facility] who will contact patient directly to schedule rides needed.  Patient given application for [facility] which provides assistance w resources for income eligible cancer patients in [place].  Patient taken to scheduler to complete release so care team can contact [name], ex wife, as patient sometimes does not hear phone and thus does not answer.  NP asked to refer patient to [employer] for additional support in community.  Per patient, he is receiving home health services from [place], arranged at discharge from recent hospitalization.  Edwyna Shell, LCSW Clinical Social Worker Phone:  [phone]

## 2017-05-25 NOTE — Progress Notes (Signed)
  Oncology Nurse Navigator Documentation  Navigator Location: [facility] (05/25/17 1608)   )Navigator Encounter Type: Other (05/25/17 1608)                         Barriers/Navigation Needs: Transportation (05/25/17 1608)   Interventions: Referrals (05/25/17 1608)  [facility] referral placed per suggestion of LCSW. Patient's wife has medical issues and is not able to transport patient to and from appointments.          Acuity: Level 2 (05/25/17 1608)   Acuity Level 2: Referrals such as genetics, survivorship (05/25/17 1608)     Time Spent with Patient: 15 (05/25/17 1608)

## 2017-05-25 NOTE — Progress Notes (Signed)
Nutrition Follow-up:  Patient with gastric cancer followed by Dr. Burr Medico. Patient planning on starting folfox on 1/23.  Patient with J-tube.    Met with patient and ex-wife in clinic today for nutrition follow-up.  Patient did not try ensure plus via J-tube.  Patient reports that he does not want to use J-tube. Ex-wife reports patient was asking surgeon to take tube out at visit yesterday.  Surgeon had encouraged patient to leave tube in during treatment.  Drinking ensure plus but does not like the vanilla flavor, asking for chocolate.  Reports that he was able to drink chocolate in the hospital.  Eating a bag of potato chips during clinic visit this am.  Reports that he continue to eat solid foods.  No report of nutrition impact symptoms (nausea, diarrhea) some abdominal pain and fullness reported.    Medications: reviewed  Labs: reviewed  Anthropometrics:   Noted weight increased to 157 lb 5 oz on 1/14 from 154 lb   NUTRITION DIAGNOSIS: Inadequate oral intake improving   MALNUTRITION DIAGNOSIS: Moderate malnutrition improving   INTERVENTION:   Discussed oral nutrition supplement options with patient.  Samples of ensure enlive, boost plus and carnation instant breakfast given to patient today to try as well as coupons.  Encouraged 2-3 per day.  If unable to tolerate high calorie shakes, may tolerate premier protein or similar products with lower sugar due to recent GI surgery.  Encouraged small frequent meals. Reviewed good sources of protein and fact sheet on "Soft Moist Protein Foods" given to patient Reminded patient to flush J-tube with 50m of water daily while not in use.  Ex-wife reports that patient does not have a drivers licenses and she will be having surgery soon and will not be able to bring him to appointments.  Asking for help with transportation.  Spoke with SEducation officer, museumand patient and ex-wife will meet with SEducation officer, museumregarding transportation later today.       MONITORING, EVALUATION, GOAL: patient will consume adequate calories and protein to prevent weight loss.   NEXT VISIT: to be scheduled during treatments  Bennie Scaff B. [place]enia Resides RLos [place] LJacksonRegistered Dietitian [phone](pager)

## 2017-05-26 ENCOUNTER — Other Ambulatory Visit: Payer: Self-pay | Admitting: *Deleted

## 2017-05-26 ENCOUNTER — Other Ambulatory Visit: Payer: Self-pay

## 2017-05-26 DIAGNOSIS — C162 Malignant neoplasm of body of stomach: Secondary | ICD-10-CM

## 2017-05-26 MED ORDER — LIDOCAINE-PRILOCAINE 2.5-2.5 % EX CREA: TOPICAL_CREAM | CUTANEOUS | 3 refills | Status: DC

## 2017-05-26 MED ORDER — ONDANSETRON HCL 8 MG PO TABS: 8.0000 mg | ORAL_TABLET | Freq: Two times a day (BID) | ORAL | 1 refills | Status: DC | PRN

## 2017-05-26 MED ORDER — DEXAMETHASONE 4 MG PO TABS: 8.0000 mg | ORAL_TABLET | Freq: Every day | ORAL | 1 refills | Status: DC

## 2017-05-26 MED ORDER — PROCHLORPERAZINE MALEATE 10 MG PO TABS: 10.0000 mg | ORAL_TABLET | Freq: Four times a day (QID) | ORAL | 1 refills | Status: DC | PRN

## 2017-05-26 NOTE — Addendum Note (Signed)
Addended by: Jone Baseman on: 05/26/2017 01:30 PM   Modules accepted: Orders

## 2017-05-26 NOTE — Patient Outreach (Addendum)
[facility]) Care Management  05/26/2017  Gabriel Poole August 31, 1945 [id number]   Referral Date: 05/26/17 Referral Source: [place] Referral Reason: Transportation   Outreach Attempt: Incoming call from patient after leaving a message.  Patient is able to verify HIPAA.  Patient with recent stomach cancer diagnosis.  Patient reports he needs help with transportation as logistic care through [place] bring him to [place].  He states that his wife is able to take him right now but she will be having surgery soon and won't be able to take him.    Social: Patient states he has a friend that lives with him presently and he is independent of all aspects of care.     Conditions: Patient with recent stomach cancer diagnosis.  Patient reports he is about to start treatments.  Patient also admits to HTN but is controlled with his medications.   Medications: Patient takes his medications as ordered and has no questions.   Appointments:  Patient has appointments scheduled with the cancer center in [place] per patient.  Patient reports he is active with [facility].    Advanced Directives:  Patient reports he is working on that now.   Consent: Patient consents to [place] [employer] for assistance with transportation.   Plan: RN CM will refer patient to social work for assist with transportation resources. RN CM will sign off case at this time.     Addendum:  12:05 pm  Called patient back to discuss further with patient care needs are receiving message from Edwyna Shell about patient.  Patient verified HIPAA.  Asked patient about his cancer he states he was diagnosed in November and he will be starting treatment.  Asked patient if he knew much about it but he could not elaborate.  Asked patient if he would be opposed to a nurse for education and support of his disease processes. Patient agrees to have a nurse to come to visit  him.  12:50 pm: Incoming call from [employer], SW.  She expressed some concern about patient and his upcoming treatments and support.  She shares that patient does not read well and has some nutrition issues related to his diagnosis and feels that some community support would help patient as his chemotherapy treatments will be powerful and will possibly really affect him. We discussed having a community nurse to go out for education and support of his new cancer diagnosis.  She feels that this would be helpful as she thinks patient does not understand the severity of his cancer diagnosis.    2:20 pm: Return call to ex-wife who is DPR.  She is able to verify HIPAA.  Discussed with her plan for nurse and SW.  She shares that they do not live together and he lives with his girlfriend who really does not help per ex-wife.  She does feel she needs to help him as she can and is taking him to his appointments presently.  She states she is scheduled for surgery on 07-05-17 and will not be able to assist.  She is the primary contact on his phone listing. Advised her to call CM if she has any questions.  She verbalized understanding.   Plan: RN CM will refer to Community Nurse for education and support of new cancer diagnosis and treatment, and any care coordination needs.    Jone Baseman, RN, MSN [address] Management Care Management Coordinator Direct Line [phone] Toll Free: [phone]  Fax: [phone]

## 2017-05-27 ENCOUNTER — Other Ambulatory Visit: Payer: Self-pay | Admitting: *Deleted

## 2017-05-27 ENCOUNTER — Other Ambulatory Visit: Payer: Self-pay | Admitting: Hematology

## 2017-05-27 ENCOUNTER — Other Ambulatory Visit (HOSPITAL_COMMUNITY)
Admission: RE | Admit: 2017-05-27 | Discharge: 2017-05-27 | Disposition: A | Discharge status: Home / Self Care | Payer: Medicare HMO | Source: Ambulatory Visit | Attending: Hematology | Admitting: Hematology

## 2017-05-27 DIAGNOSIS — E46 Unspecified protein-calorie malnutrition: Secondary | ICD-10-CM | POA: Diagnosis not present

## 2017-05-27 DIAGNOSIS — C163 Malignant neoplasm of pyloric antrum: Secondary | ICD-10-CM | POA: Diagnosis not present

## 2017-05-27 DIAGNOSIS — C162 Malignant neoplasm of body of stomach: Secondary | ICD-10-CM | POA: Insufficient documentation

## 2017-05-27 DIAGNOSIS — I10 Essential (primary) hypertension: Secondary | ICD-10-CM | POA: Diagnosis not present

## 2017-05-27 DIAGNOSIS — Z903 Acquired absence of stomach [part of]: Secondary | ICD-10-CM | POA: Diagnosis not present

## 2017-05-27 DIAGNOSIS — E785 Hyperlipidemia, unspecified: Secondary | ICD-10-CM | POA: Diagnosis not present

## 2017-05-27 DIAGNOSIS — F17200 Nicotine dependence, unspecified, uncomplicated: Secondary | ICD-10-CM | POA: Diagnosis not present

## 2017-05-27 DIAGNOSIS — Z483 Aftercare following surgery for neoplasm: Secondary | ICD-10-CM | POA: Diagnosis not present

## 2017-05-27 DIAGNOSIS — Z434 Encounter for attention to other artificial openings of digestive tract: Secondary | ICD-10-CM | POA: Diagnosis not present

## 2017-05-27 NOTE — Patient Outreach (Signed)
Telephone call to pt to schedule initial home visit, no answer to [phone] and received message "voicemail not set up", called other number listed in patient's chart [phone], spoke with [name] who reports " pt does not ever answer the [phone] number and I'm his contact person for that reason and my number is listed"  RN CM scheduled home visit to see pt week of January 28th due to patient has multiple appointments next week for chemotherapy and going to cancer center per [name] and states she will verify with pt about the date and time of home visit and if any problems with this date, she or pt will let RN CM know.  PLAN See pt for home visit week of January 28  Jacqlyn Larsen [facility], [place] Coordinator [phone]

## 2017-05-28 ENCOUNTER — Encounter (HOSPITAL_COMMUNITY): Payer: Self-pay

## 2017-05-28 ENCOUNTER — Encounter (HOSPITAL_BASED_OUTPATIENT_CLINIC_OR_DEPARTMENT_OTHER): Payer: Self-pay | Admitting: Anesthesiology

## 2017-05-28 ENCOUNTER — Other Ambulatory Visit: Payer: Self-pay

## 2017-05-28 ENCOUNTER — Ambulatory Visit (HOSPITAL_COMMUNITY): Payer: Medicare HMO

## 2017-05-28 ENCOUNTER — Ambulatory Visit (HOSPITAL_BASED_OUTPATIENT_CLINIC_OR_DEPARTMENT_OTHER): Payer: Medicare HMO | Admitting: Anesthesiology

## 2017-05-28 ENCOUNTER — Encounter (HOSPITAL_BASED_OUTPATIENT_CLINIC_OR_DEPARTMENT_OTHER)
Admission: RE | Disposition: A | Discharge status: Home / Self Care | Payer: Self-pay | Source: Ambulatory Visit | Attending: General Surgery

## 2017-05-28 ENCOUNTER — Ambulatory Visit (HOSPITAL_BASED_OUTPATIENT_CLINIC_OR_DEPARTMENT_OTHER)
Admission: RE | Admit: 2017-05-28 | Discharge: 2017-05-28 | Disposition: A | Discharge status: Home / Self Care | Payer: Medicare HMO | Source: Ambulatory Visit | Attending: General Surgery | Admitting: General Surgery

## 2017-05-28 DIAGNOSIS — Z7982 Long term (current) use of aspirin: Secondary | ICD-10-CM | POA: Insufficient documentation

## 2017-05-28 DIAGNOSIS — M1990 Unspecified osteoarthritis, unspecified site: Secondary | ICD-10-CM | POA: Insufficient documentation

## 2017-05-28 DIAGNOSIS — Z85028 Personal history of other malignant neoplasm of stomach: Secondary | ICD-10-CM | POA: Insufficient documentation

## 2017-05-28 DIAGNOSIS — Z4682 Encounter for fitting and adjustment of non-vascular catheter: Secondary | ICD-10-CM | POA: Diagnosis not present

## 2017-05-28 DIAGNOSIS — F17200 Nicotine dependence, unspecified, uncomplicated: Secondary | ICD-10-CM | POA: Diagnosis not present

## 2017-05-28 DIAGNOSIS — C162 Malignant neoplasm of body of stomach: Secondary | ICD-10-CM | POA: Diagnosis not present

## 2017-05-28 DIAGNOSIS — Z79899 Other long term (current) drug therapy: Secondary | ICD-10-CM | POA: Insufficient documentation

## 2017-05-28 DIAGNOSIS — C772 Secondary and unspecified malignant neoplasm of intra-abdominal lymph nodes: Secondary | ICD-10-CM | POA: Diagnosis not present

## 2017-05-28 DIAGNOSIS — Z903 Acquired absence of stomach [part of]: Secondary | ICD-10-CM | POA: Diagnosis not present

## 2017-05-28 DIAGNOSIS — Z880 Allergy status to penicillin: Secondary | ICD-10-CM | POA: Insufficient documentation

## 2017-05-28 DIAGNOSIS — Z95828 Presence of other vascular implants and grafts: Secondary | ICD-10-CM

## 2017-05-28 DIAGNOSIS — E785 Hyperlipidemia, unspecified: Secondary | ICD-10-CM | POA: Diagnosis not present

## 2017-05-28 DIAGNOSIS — I10 Essential (primary) hypertension: Secondary | ICD-10-CM | POA: Diagnosis not present

## 2017-05-28 DIAGNOSIS — C169 Malignant neoplasm of stomach, unspecified: Secondary | ICD-10-CM | POA: Diagnosis not present

## 2017-05-28 DIAGNOSIS — Z452 Encounter for adjustment and management of vascular access device: Secondary | ICD-10-CM | POA: Diagnosis not present

## 2017-05-28 DIAGNOSIS — F17210 Nicotine dependence, cigarettes, uncomplicated: Secondary | ICD-10-CM | POA: Insufficient documentation

## 2017-05-28 DIAGNOSIS — R339 Retention of urine, unspecified: Secondary | ICD-10-CM

## 2017-05-28 HISTORY — PX: PORTACATH PLACEMENT: SHX2246

## 2017-05-28 LAB — URINALYSIS, ROUTINE W REFLEX MICROSCOPIC
BILIRUBIN URINE: NEGATIVE
Glucose, UA: NEGATIVE mg/dL
Ketones, ur: NEGATIVE mg/dL
Nitrite: NEGATIVE
PH: 6 (ref 5.0–8.0)
Protein, ur: NEGATIVE mg/dL
SPECIFIC GRAVITY, URINE: 1.009 (ref 1.005–1.030)

## 2017-05-28 SURGERY — INSERTION, TUNNELED CENTRAL VENOUS DEVICE, WITH PORT
Anesthesia: General | Site: Chest

## 2017-05-28 MED ORDER — CIPROFLOXACIN IN D5W 400 MG/200ML IV SOLN
400.0000 mg | INTRAVENOUS | Status: AC
  Administered 2017-05-28: 400 mg via INTRAVENOUS

## 2017-05-28 MED ORDER — ONDANSETRON HCL 4 MG/2ML IJ SOLN
INTRAMUSCULAR | Status: DC | PRN
Start: 2017-05-28 — End: 2017-05-28
  Administered 2017-05-28: 4 mg via INTRAVENOUS

## 2017-05-28 MED ORDER — GABAPENTIN 300 MG PO CAPS
300.0000 mg | ORAL_CAPSULE | ORAL | Status: AC
  Administered 2017-05-28: 300 mg via ORAL

## 2017-05-28 MED ORDER — OXYCODONE HCL 5 MG PO TABS: 5.0000 mg | ORAL_TABLET | Freq: Four times a day (QID) | ORAL | 0 refills | Status: DC | PRN

## 2017-05-28 MED ORDER — BUPIVACAINE HCL (PF) 0.5 % IJ SOLN
INTRAMUSCULAR | Status: AC
  Filled 2017-05-28: qty 30

## 2017-05-28 MED ORDER — CHLORHEXIDINE GLUCONATE CLOTH 2 % EX PADS: 6.0000 | MEDICATED_PAD | Freq: Once | CUTANEOUS | Status: DC

## 2017-05-28 MED ORDER — ACETAMINOPHEN 500 MG PO TABS
1000.0000 mg | ORAL_TABLET | ORAL | Status: AC
  Administered 2017-05-28: 1000 mg via ORAL

## 2017-05-28 MED ORDER — DEXAMETHASONE SODIUM PHOSPHATE 4 MG/ML IJ SOLN
INTRAMUSCULAR | Status: DC | PRN
  Administered 2017-05-28: 10 mg via INTRAVENOUS

## 2017-05-28 MED ORDER — BUPIVACAINE-EPINEPHRINE 0.5% -1:200000 IJ SOLN
INTRAMUSCULAR | Status: DC | PRN
  Administered 2017-05-28: 10 mL

## 2017-05-28 MED ORDER — FENTANYL CITRATE (PF) 100 MCG/2ML IJ SOLN
50.0000 ug | INTRAMUSCULAR | Status: DC | PRN
  Administered 2017-05-28: 50 ug via INTRAVENOUS

## 2017-05-28 MED ORDER — GLYCOPYRROLATE 0.2 MG/ML IV SOSY
PREFILLED_SYRINGE | INTRAVENOUS | Status: DC | PRN
  Administered 2017-05-28: .2 mg via INTRAVENOUS

## 2017-05-28 MED ORDER — LIDOCAINE 2% (20 MG/ML) 5 ML SYRINGE
INTRAMUSCULAR | Status: AC
  Filled 2017-05-28: qty 5

## 2017-05-28 MED ORDER — HEPARIN SOD (PORK) LOCK FLUSH 100 UNIT/ML IV SOLN
INTRAVENOUS | Status: AC
  Filled 2017-05-28: qty 5

## 2017-05-28 MED ORDER — SCOPOLAMINE 1 MG/3DAYS TD PT72: 1.0000 | MEDICATED_PATCH | Freq: Once | TRANSDERMAL | Status: DC | PRN

## 2017-05-28 MED ORDER — BUPIVACAINE-EPINEPHRINE (PF) 0.5% -1:200000 IJ SOLN
INTRAMUSCULAR | Status: AC
  Filled 2017-05-28: qty 30

## 2017-05-28 MED ORDER — GABAPENTIN 300 MG PO CAPS
ORAL_CAPSULE | ORAL | Status: AC
  Filled 2017-05-28: qty 1

## 2017-05-28 MED ORDER — CIPROFLOXACIN IN D5W 400 MG/200ML IV SOLN
INTRAVENOUS | Status: AC
  Filled 2017-05-28: qty 200

## 2017-05-28 MED ORDER — HEPARIN (PORCINE) IN NACL 2-0.9 UNIT/ML-% IJ SOLN
INTRAMUSCULAR | Status: AC | PRN
  Administered 2017-05-28: 1 via INTRAVENOUS

## 2017-05-28 MED ORDER — ACETAMINOPHEN 500 MG PO TABS
ORAL_TABLET | ORAL | Status: AC
  Filled 2017-05-28: qty 2

## 2017-05-28 MED ORDER — ONDANSETRON HCL 4 MG/2ML IJ SOLN
INTRAMUSCULAR | Status: AC
  Filled 2017-05-28: qty 2

## 2017-05-28 MED ORDER — LACTATED RINGERS IV SOLN
INTRAVENOUS | Status: DC
  Administered 2017-05-28 (×2): via INTRAVENOUS

## 2017-05-28 MED ORDER — MIDAZOLAM HCL 2 MG/2ML IJ SOLN: 1.0000 mg | INTRAMUSCULAR | Status: DC | PRN

## 2017-05-28 MED ORDER — PROPOFOL 10 MG/ML IV BOLUS
INTRAVENOUS | Status: DC | PRN
  Administered 2017-05-28: 120 mg via INTRAVENOUS

## 2017-05-28 MED ORDER — EPHEDRINE SULFATE-NACL 50-0.9 MG/10ML-% IV SOSY
PREFILLED_SYRINGE | INTRAVENOUS | Status: DC | PRN
  Administered 2017-05-28: 10 mg via INTRAVENOUS

## 2017-05-28 MED ORDER — FENTANYL CITRATE (PF) 100 MCG/2ML IJ SOLN: 25.0000 ug | INTRAMUSCULAR | Status: DC | PRN

## 2017-05-28 MED ORDER — HEPARIN (PORCINE) IN NACL 2-0.9 UNIT/ML-% IJ SOLN
INTRAMUSCULAR | Status: AC
  Filled 2017-05-28: qty 500

## 2017-05-28 MED ORDER — DEXAMETHASONE SODIUM PHOSPHATE 10 MG/ML IJ SOLN
INTRAMUSCULAR | Status: AC
  Filled 2017-05-28: qty 1

## 2017-05-28 MED ORDER — FENTANYL CITRATE (PF) 100 MCG/2ML IJ SOLN
INTRAMUSCULAR | Status: AC
  Filled 2017-05-28: qty 2

## 2017-05-28 MED ORDER — LIDOCAINE-EPINEPHRINE (PF) 1 %-1:200000 IJ SOLN
INTRAMUSCULAR | Status: AC
  Filled 2017-05-28: qty 30

## 2017-05-28 MED ORDER — PROPOFOL 10 MG/ML IV BOLUS
INTRAVENOUS | Status: AC
  Filled 2017-05-28: qty 20

## 2017-05-28 MED ORDER — LIDOCAINE 2% (20 MG/ML) 5 ML SYRINGE
INTRAMUSCULAR | Status: DC | PRN
  Administered 2017-05-28: 60 mg via INTRAVENOUS

## 2017-05-28 MED ORDER — HEPARIN SOD (PORK) LOCK FLUSH 100 UNIT/ML IV SOLN
INTRAVENOUS | Status: DC | PRN
  Administered 2017-05-28: 500 [IU]

## 2017-05-28 SURGICAL SUPPLY — 43 items
BAG DECANTER FOR FLEXI CONT (MISCELLANEOUS) ×3 IMPLANT
BLADE HEX COATED 2.75 (ELECTRODE) ×3 IMPLANT
BLADE SURG 11 STRL SS (BLADE) ×3 IMPLANT
BLADE SURG 15 STRL LF DISP TIS (BLADE) ×1 IMPLANT
BLADE SURG 15 STRL SS (BLADE) ×2
CHLORAPREP W/TINT 26ML (MISCELLANEOUS) ×3 IMPLANT
COVER BACK TABLE 60X90IN (DRAPES) ×3 IMPLANT
COVER MAYO STAND STRL (DRAPES) ×3 IMPLANT
DECANTER SPIKE VIAL GLASS SM (MISCELLANEOUS) IMPLANT
DERMABOND ADVANCED (GAUZE/BANDAGES/DRESSINGS) ×2
DERMABOND ADVANCED .7 DNX12 (GAUZE/BANDAGES/DRESSINGS) ×1 IMPLANT
DRAPE C-ARM 42X72 X-RAY (DRAPES) ×3 IMPLANT
DRAPE LAPAROTOMY TRNSV 102X78 (DRAPE) ×3 IMPLANT
DRAPE UTILITY XL STRL (DRAPES) ×3 IMPLANT
DRSG TEGADERM 4X4.75 (GAUZE/BANDAGES/DRESSINGS) IMPLANT
ELECT REM PT RETURN 9FT ADLT (ELECTROSURGICAL) ×3
ELECTRODE REM PT RTRN 9FT ADLT (ELECTROSURGICAL) ×1 IMPLANT
GAUZE SPONGE 4X4 12PLY STRL LF (GAUZE/BANDAGES/DRESSINGS) ×3 IMPLANT
GLOVE BIO SURGEON STRL SZ 6 (GLOVE) ×3 IMPLANT
GLOVE BIOGEL PI IND STRL 6.5 (GLOVE) ×1 IMPLANT
GLOVE BIOGEL PI IND STRL 7.0 (GLOVE) ×3 IMPLANT
GLOVE BIOGEL PI INDICATOR 6.5 (GLOVE) ×2
GLOVE BIOGEL PI INDICATOR 7.0 (GLOVE) ×6
GLOVE ECLIPSE 6.5 STRL STRAW (GLOVE) ×6 IMPLANT
GOWN STRL REUS W/ TWL LRG LVL3 (GOWN DISPOSABLE) ×2 IMPLANT
GOWN STRL REUS W/TWL 2XL LVL3 (GOWN DISPOSABLE) ×3 IMPLANT
GOWN STRL REUS W/TWL LRG LVL3 (GOWN DISPOSABLE) ×4
IV CONNECTOR ONE LINK NDLESS (IV SETS) IMPLANT
KIT PORT POWER 8FR ISP CVUE (Miscellaneous) ×3 IMPLANT
NEEDLE HYPO 25X1 1.5 SAFETY (NEEDLE) ×3 IMPLANT
PACK BASIN DAY SURGERY FS (CUSTOM PROCEDURE TRAY) ×3 IMPLANT
PENCIL BUTTON HOLSTER BLD 10FT (ELECTRODE) ×3 IMPLANT
SLEEVE SCD COMPRESS KNEE MED (MISCELLANEOUS) ×3 IMPLANT
SUT MNCRL AB 4-0 PS2 18 (SUTURE) ×3 IMPLANT
SUT PROLENE 2 0 SH DA (SUTURE) ×6 IMPLANT
SUT VIC AB 3-0 SH 27 (SUTURE) ×2
SUT VIC AB 3-0 SH 27X BRD (SUTURE) ×1 IMPLANT
SUT VICRYL 3-0 CR8 SH (SUTURE) IMPLANT
SYR 10ML LL (SYRINGE) ×3 IMPLANT
SYR 5ML LUER SLIP (SYRINGE) ×3 IMPLANT
SYR CONTROL 10ML LL (SYRINGE) ×3 IMPLANT
TOWEL OR 17X24 6PK STRL BLUE (TOWEL DISPOSABLE) ×3 IMPLANT
TOWEL OR NON WOVEN STRL DISP B (DISPOSABLE) ×3 IMPLANT

## 2017-05-28 NOTE — Transfer of Care (Signed)
Immediate Anesthesia Transfer of Care Note  Patient: Gabriel Poole  Procedure(s) Performed: INSERTION PORT-A-CATH ERAS PATHWAY (N/A Chest)  Patient Location: PACU  Anesthesia Type:General  Level of Consciousness: awake, sedated and patient cooperative  Airway & Oxygen Therapy: Patient Spontanous Breathing and Patient connected to face mask oxygen  Post-op Assessment: Report given to RN and Post -op Vital signs reviewed and stable  Post vital signs: Reviewed and stable  Last Vitals:  Vitals:   05/28/17 0835 05/28/17 0836  BP: (!) 141/79   Pulse: 69 67  Resp:  17  Temp:    SpO2: 100% 99%    Last Pain:  Vitals:   05/28/17 0650  TempSrc:   PainSc: 9       Patients Stated Pain Goal: 5 (17/79/39 0300)  Complications: No apparent anesthesia complications

## 2017-05-28 NOTE — Anesthesia Procedure Notes (Signed)
Procedure Name: LMA Insertion Date/Time: 05/28/2017 7:50 AM Performed by: Lyndee Leo, CRNA Pre-anesthesia Checklist: Patient identified, Emergency Drugs available, Suction available and Patient being monitored Patient Re-evaluated:Patient Re-evaluated prior to induction Oxygen Delivery Method: Circle system utilized Preoxygenation: Pre-oxygenation with 100% oxygen Induction Type: IV induction Ventilation: Mask ventilation without difficulty LMA: LMA inserted LMA Size: 4.0 Number of attempts: 1 Airway Equipment and Method: Bite block Placement Confirmation: positive ETCO2 Tube secured with: Tape Dental Injury: Teeth and Oropharynx as per pre-operative assessment

## 2017-05-28 NOTE — Anesthesia Postprocedure Evaluation (Signed)
Anesthesia Post Note  Patient: Gabriel Poole  Procedure(s) Performed: INSERTION PORT-A-CATH ERAS PATHWAY (N/A Chest)     Patient location during evaluation: PACU Anesthesia Type: General Level of consciousness: awake and alert Pain management: pain level controlled Vital Signs Assessment: post-procedure vital signs reviewed and stable Respiratory status: spontaneous breathing, nonlabored ventilation and respiratory function stable Cardiovascular status: blood pressure returned to baseline and stable Postop Assessment: no apparent nausea or vomiting Anesthetic complications: no    Last Vitals:  Vitals:   05/28/17 0930 05/28/17 0945  BP: (!) 159/79 (!) 170/87  Pulse: 62 64  Resp: (!) 21 13  Temp:    SpO2: 98% 99%    Last Pain:  Vitals:   05/28/17 0945  TempSrc:   PainSc: 0-No pain                 Anatole Apollo,W. EDMOND

## 2017-05-28 NOTE — ED Triage Notes (Signed)
Pt arrives via POV with spouse; pt states he is unable to urinate;Pt is  Stomach Cancer pt and had port placed this morning; pt family states pt was urinated all the way home due to medications given today; Pt c/o bladder pain that started at 6pm; pt family states PCP states he may need to be catherized; Pt c/o pain at 10/10 on arrival.-Monique,RN

## 2017-05-28 NOTE — Op Note (Signed)
PREOPERATIVE DIAGNOSIS:  Gastric cancer     POSTOPERATIVE DIAGNOSIS:  Same     PROCEDURE: Left subclavian port placement, Bard ClearVue  Power Port, MRI safe, 8-French.      SURGEON:  Stark Klein, MD      ANESTHESIA:  General   FINDINGS:  Good venous return, easy flush, and tip of the catheter and   SVC 23.5 cm.      SPECIMEN:  None.      ESTIMATED BLOOD LOSS:  Minimal.      COMPLICATIONS:  None known.      PROCEDURE:  Pt was identified in the holding area and taken to   the operating room, where patient was placed supine on the operating room   table.  General anesthesia was induced.  Patient's arms were tucked and the upper   chest and neck were prepped and draped in sterile fashion.  Time-out was   performed according to the surgical safety check list.  When all was   correct, we continued.   Local anesthetic was administered over this   area at the angle of the clavicle.  The vein was accessed with 2 pass(es) of the needle. There was good venous return and the wire passed easily with no ectopy.   Fluoroscopy was used to confirm that the wire was in the vena cava.      The patient was placed back level and the area for the pocket was anethetized   with local anesthetic.  A 3-cm transverse incision was made with a #15   blade.  Cautery was used to divide the subcutaneous tissues down to the   pectoralis muscle.  An [employer] retractor was used to elevate the skin   while a pocket was created on top of the pectoralis fascia.  The port   was placed into the pocket to confirm that it was of adequate size.  The   catheter was preattached to the port.  The port was then secured to the   pectoralis fascia with four 2-0 Prolene sutures.  These were clamped and   not tied down yet.    The catheter was tunneled through to the wire exit   site.  The catheter was placed along the wire to determine what length it should be to be in the SVC.  The catheter was cut at 23.5 cm.  The  tunneler sheath and dilator were passed over the wire and the dilator and wire were removed.  The catheter was advanced through the tunneler sheath and the tunneler sheath was pulled away.  Care was taken to keep the catheter in the tunneler sheath as this occurred. This was advanced and the tunneler sheath was removed.  There was good venous   return and easy flush of the catheter.  The Prolene sutures were tied   down to the pectoral fascia.  The skin was reapproximated using 3-0   Vicryl interrupted deep dermal sutures.    Fluoroscopy was used to re-confirm good position of the catheter.  The skin   was then closed using 4-0 Monocryl in a subcuticular fashion.  The port was flushed with concentrated heparin flush as well.  The wounds were then cleaned, dried, and dressed with Dermabond.  The patient was awakened from anesthesia and taken to the PACU in stable condition.  Needle, sponge, and instrument counts were correct.               Stark Klein, MD

## 2017-05-28 NOTE — Interval H&P Note (Signed)
History and Physical Interval Note:  05/28/2017 7:37 AM  Gabriel Poole  has presented today for surgery, with the diagnosis of gastric cancer  The various methods of treatment have been discussed with the patient and family. After consideration of risks, benefits and other options for treatment, the patient has consented to  Procedure(s): [place] (N/A) as a surgical intervention .  The patient's history has been reviewed, patient examined, no change in status, stable for surgery.  I have reviewed the patient's chart and labs.  Questions were answered to the patient's satisfaction.     Stark Klein

## 2017-05-28 NOTE — Anesthesia Preprocedure Evaluation (Addendum)
Anesthesia Evaluation  Patient identified by MRN, date of birth, ID band Patient awake    Reviewed: Allergy & Precautions, H&P , NPO status , Patient's Chart, lab work & pertinent test results, reviewed documented beta blocker date and time   Airway Mallampati: I  TM Distance: >3 FB Neck ROM: Full    Dental no notable dental hx. (+) Partial Upper, Dental Advisory Given   Pulmonary Current Smoker,    Pulmonary exam normal breath sounds clear to auscultation       Cardiovascular hypertension, Pt. on medications and Pt. on home beta blockers  Rhythm:Regular Rate:Normal     Neuro/Psych negative neurological ROS  negative psych ROS   GI/Hepatic Neg liver ROS, Stomach CA   Endo/Other  negative endocrine ROS  Renal/GU negative Renal ROS  negative genitourinary   Musculoskeletal  (+) Arthritis , Osteoarthritis,    Abdominal   Peds  Hematology negative hematology ROS (+)   Anesthesia Other Findings   Reproductive/Obstetrics negative OB ROS                            Anesthesia Physical Anesthesia Plan  ASA: III  Anesthesia Plan: General   Post-op Pain Management:    Induction: Intravenous  PONV Risk Score and Plan: 2 and Ondansetron and Dexamethasone  Airway Management Planned: LMA and Oral ETT  Additional Equipment:   Intra-op Plan:   Post-operative Plan: Extubation in OR  Informed Consent: I have reviewed the patients History and Physical, chart, labs and discussed the procedure including the risks, benefits and alternatives for the proposed anesthesia with the patient or authorized representative who has indicated his/her understanding and acceptance.   Dental advisory given  Plan Discussed with: CRNA  Anesthesia Plan Comments:        Anesthesia Quick Evaluation

## 2017-05-28 NOTE — Discharge Instructions (Addendum)
[facility] °Office Phone Number [phone] ° ° POST OP INSTRUCTIONS ° °Always review your discharge instruction sheet given to you by the facility where your surgery was performed. ° °IF YOU HAVE DISABILITY OR FAMILY LEAVE FORMS, YOU MUST BRING THEM TO THE OFFICE FOR PROCESSING.  DO NOT GIVE THEM TO YOUR DOCTOR. ° °1. A prescription for pain medication may be given to you upon discharge.  Take your pain medication as prescribed, if needed.  If narcotic pain medicine is not needed, then you may take acetaminophen (Tylenol) or ibuprofen (Advil) as needed. °2. Take your usually prescribed medications unless otherwise directed °3. If you need a refill on your pain medication, please contact your pharmacy.  They will contact our office to request authorization.  Prescriptions will not be filled after 5pm or on week-ends. °4. You should eat very light the first 24 hours after surgery, such as soup, crackers, pudding, etc.  Resume your normal diet the day after surgery °5. It is common to experience some constipation if taking pain medication after surgery.  Increasing fluid intake and taking a stool softener will usually help or prevent this problem from occurring.  A mild laxative (Milk of Magnesia or Miralax) should be taken according to package directions if there are no bowel movements after 48 hours. °6. You may shower in 48 hours.  The surgical glue will flake off in 2-3 weeks.   °7. ACTIVITIES:  No strenuous activity or heavy lifting for 1 week.   °a. You may drive when you no longer are taking prescription pain medication, you can comfortably wear a seatbelt, and you can safely maneuver your car and apply brakes. °b. RETURN TO WORK:  __________to be determined.  _______________ °You should see your doctor in the office for a follow-up appointment approximately three-four weeks after your surgery.   ° °WHEN TO CALL YOUR DOCTOR: °1. Fever over 101.0 °2. Nausea and/or vomiting. °3. Extreme swelling  or bruising. °4. Continued bleeding from incision. °5. Increased pain, redness, or drainage from the incision. ° °The clinic staff is available to answer your questions during regular business hours.  Please don’t hesitate to call and ask to speak to one of the nurses for clinical concerns.  If you have a medical emergency, go to the nearest emergency room or call 911.  A surgeon from [facility] is always on call at the hospital. ° °For further questions, please visit [email or web address]  ° ° ° ° °Post Anesthesia Home Care Instructions ° °Activity: °Get plenty of rest for the remainder of the day. A responsible individual must stay with you for 24 hours following the procedure.  °For the next 24 hours, DO NOT: °-Drive a car °-Operate machinery °-Drink alcoholic beverages °-Take any medication unless instructed by your physician °-Make any legal decisions or sign important papers. ° °Meals: °Start with liquid foods such as gelatin or soup. Progress to regular foods as tolerated. Avoid greasy, spicy, heavy foods. If nausea and/or vomiting occur, drink only clear liquids until the nausea and/or vomiting subsides. Call your physician if vomiting continues. ° °Special Instructions/Symptoms: °Your throat may feel dry or sore from the anesthesia or the breathing tube placed in your throat during surgery. If this causes discomfort, gargle with warm salt water. The discomfort should disappear within 24 hours. ° °If you had a scopolamine patch placed behind your ear for the management of post- operative nausea and/or vomiting: ° °1. The medication in the patch is effective for 72   hours, after which it should be removed.  Wrap patch in a tissue and discard in the trash. Wash hands thoroughly with soap and water. °2. You may remove the patch earlier than 72 hours if you experience unpleasant side effects which may include dry mouth, dizziness or visual disturbances. °3. Avoid touching the patch. Wash your  hands with soap and water after contact with the patch. °  ° °

## 2017-05-29 ENCOUNTER — Emergency Department (HOSPITAL_COMMUNITY)
Admission: EM | Admit: 2017-05-29 | Discharge: 2017-05-29 | Disposition: A | Discharge status: Home / Self Care | Payer: Medicare HMO | Source: Home / Self Care | Attending: Emergency Medicine | Admitting: Emergency Medicine

## 2017-05-29 DIAGNOSIS — C163 Malignant neoplasm of pyloric antrum: Secondary | ICD-10-CM | POA: Diagnosis not present

## 2017-05-29 DIAGNOSIS — R1310 Dysphagia, unspecified: Secondary | ICD-10-CM | POA: Diagnosis not present

## 2017-05-29 DIAGNOSIS — R339 Retention of urine, unspecified: Secondary | ICD-10-CM

## 2017-05-29 DIAGNOSIS — C162 Malignant neoplasm of body of stomach: Secondary | ICD-10-CM | POA: Diagnosis not present

## 2017-05-29 NOTE — ED Notes (Signed)
Patient denies pain and is resting comfortably.  

## 2017-05-29 NOTE — ED Notes (Signed)
Pt states that if the plan is to admit him he doesn't want to stay and will go home.

## 2017-05-29 NOTE — ED Provider Notes (Signed)
[facility] EMERGENCY DEPARTMENT Provider Note   CSN: [id number] Arrival date & time: 05/28/17  2042     History   Chief Complaint Chief Complaint  Patient presents with  . Urinary Retention    HPI Gabriel Poole is a 72 y.o. male.  Patient presents to the ED with a chief complaint of urinary retention.  He states that he had surgery yesterday to have a port placed.  He has a hx of stomach cancer.  The was placed under general anesthesia.  He states that when he got home he was able to urinate once, but nothing more.  He reports that he has not urinated since this afternoon.  He denies any other associate problems.  He states that he has had post-operative urinary retention before.   The history is provided by the patient. No language interpreter was used.    Past Medical History:  Diagnosis Date  . Arthritis   . Cancer [facility]) dx oct 02-2017   stomach  . Gout   . Heart murmur   . Hyperlipidemia   . Hypertension   . Stomach cancer [facility])     Patient Active Problem List   Diagnosis Date Noted  . 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 Malignant neoplasm of body of stomach ([place])   . Abdominal pain, epigastric 02/01/2017  . 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 Pain of right lower leg 11/29/2014  . Elevated PSA, less than 10 ng/ml 10/31/2014  . Noncompliance 09/21/2012  . HTN (hypertension) 09/21/2012  . HLD (hyperlipidemia) 09/21/2012  . Gout 09/21/2012  . Hydrocele 12/17/2010  . Tobacco abuse 12/17/2010    Past Surgical History:  Procedure Laterality Date  . COLONOSCOPY    . ESOPHAGOGASTRODUODENOSCOPY N/A 02/17/2017   Procedure: ESOPHAGOGASTRODUODENOSCOPY (EGD);  Surgeon: Rogene Houston, MD;  [facility];  Service: Endoscopy;  Laterality: N/A;  3:00  . EUS N/A 03/11/2017   Procedure: UPPER ENDOSCOPIC ULTRASOUND (EUS) LINEAR;  Surgeon: Milus Banister, MD;  Location: [facility] ENDOSCOPY;   Service: Endoscopy;  Laterality: N/A;  . EUS N/A 03/11/2017   Procedure: UPPER ENDOSCOPIC ULTRASOUND (EUS) RADIAL;  Surgeon: Milus Banister, MD;  Location: [facility] ENDOSCOPY;  Service: Endoscopy;  Laterality: N/A;  . FINGER SURGERY     Lt middle   . GASTRECTOMY N/A 04/22/2017   Procedure: PARTIAL GASTRECTOMY;  Surgeon: Stark Klein, MD;  Location: [place];  Service: General;  Laterality: N/A;  . GASTROJEJUNOSTOMY N/A 04/22/2017   Procedure: JEJUNAL FEEDING TUBE PLACEMENT;  Surgeon: Stark Klein, MD;  Location: [place];  Service: General;  Laterality: N/A;  . HYDROCELE EXCISION  02/12/2011   Procedure: HYDROCELECTOMY ADULT;  Surgeon: Marissa Nestle;  Location: [facility] ORS;  Service: Urology;  Laterality: Left;  . LAPAROSCOPY N/A 04/22/2017   Procedure: LAPAROSCOPY DIAGNOSTIC ERAS PATHWAY;  Surgeon: Stark Klein, MD;  Location: [place];  Service: General;  Laterality: N/A;  EPIDURAL       Home Medications    Prior to Admission medications   Medication Sig Start Date End Date Taking? Authorizing Provider  amLODipine (NORVASC) 10 MG tablet Take 1 tablet (10 mg total) by mouth daily. 12/04/15   Claretta Fraise, MD  aspirin EC 81 MG tablet Take 81 mg by mouth daily.    [provider]  atorvastatin (LIPITOR) 40 MG tablet Take 1 tablet (40 mg total) by mouth daily at 6 PM. 05/08/15   Dettinger, Fransisca Kaufmann, MD  Cholecalciferol (VITAMIN D3) 5000 units CAPS Take 5,000 Units by mouth daily.    [provider]  dexamethasone (DECADRON) 4 MG tablet Take 2 tablets (8 mg total) by mouth daily. Start the day after chemotherapy for 2 days. Take with food. 05/26/17   Truitt Merle, MD  ENSURE PLUS (ENSURE PLUS) LIQD Give via J-tube at 45ml/hr for 12 hours.  Flush with 68ml of water before and after stopping feeding. 05/18/17   Alla Feeling, NP  lidocaine-prilocaine (EMLA) cream Apply to affected area once 05/26/17   Truitt Merle, MD  metoprolol tartrate (LOPRESSOR) 50 MG tablet Take 50 mg by mouth 2 (two)  times daily.    [provider]  ondansetron (ZOFRAN) 8 MG tablet Take 1 tablet (8 mg total) by mouth 2 (two) times daily as needed for refractory nausea / vomiting. Start on day 3 after chemotherapy. 05/26/17   Truitt Merle, MD  oxyCODONE (OXY IR/ROXICODONE) 5 MG immediate release tablet Take 1-2 tablets (5-10 mg total) by mouth every 6 (six) hours as needed for moderate pain, severe pain or breakthrough pain. 05/28/17   Stark Klein, MD  pantoprazole (PROTONIX) 40 MG tablet Take 1 tablet (40 mg total) by mouth 2 (two) times daily before a meal. 02/01/17   Setzer, Rona Ravens, NP  prochlorperazine (COMPAZINE) 10 MG tablet Take 1 tablet (10 mg total) by mouth every 6 (six) hours as needed (Nausea or vomiting). 05/26/17   Truitt Merle, MD  sucralfate (CARAFATE) 1 g tablet Take 1 g by mouth 2 (two) times daily.    [provider]    Family History Family History  Problem Relation Age of Onset  . Hypertension Mother   . Heart attack Father   . Cancer Maternal Aunt        leukemia  . Cancer Maternal Aunt        leukemia  . Hypotension Neg Hx   . Anesthesia problems Neg Hx   . Malignant hyperthermia Neg Hx   . Pseudochol deficiency Neg Hx     Social History Social History   Tobacco Use  . Smoking status: Current Some Day Smoker    Packs/day: 0.50    Years: 45.00    Pack years: 22.50    Types: Cigarettes, Cigars    Last attempt to quit: 04/2017    Years since quitting: 0.1  . Smokeless tobacco: Never Used  Substance Use Topics  . Alcohol use: Yes    Alcohol/week: 0.0 oz    Comment: Brandy daily  . Drug use: No     Allergies   Penicillins   Review of Systems Review of Systems  All other systems reviewed and are negative.    Physical Exam Updated Vital Signs BP (!) 180/95 (BP Location: Left Arm)   Pulse (!) 107   Temp (!) 97.5 F (36.4 C) (Oral)   Resp 16   SpO2 98%   Physical Exam  Constitutional: He is oriented to person, place, and time. He appears  well-developed and well-nourished.  HENT:  Head: Normocephalic and atraumatic.  Eyes: Conjunctivae and EOM are normal. Pupils are equal, round, and reactive to light. Right eye exhibits no discharge. Left eye exhibits no discharge. No scleral icterus.  Neck: Normal range of motion. Neck supple. No JVD present.  Cardiovascular: Normal rate, regular rhythm and normal heart sounds. Exam reveals no gallop and no friction rub.  No murmur heard. Pulmonary/Chest: Effort normal and breath sounds normal. No respiratory distress. He has no wheezes.  He has no rales. He exhibits no tenderness.  Abdominal: Soft. He exhibits no distension and no mass. There is no tenderness. There is no rebound and no guarding.  Musculoskeletal: Normal range of motion. He exhibits no edema or tenderness.  Neurological: He is alert and oriented to person, place, and time.  Skin: Skin is warm and dry.  Psychiatric: He has a normal mood and affect. His behavior is normal. Judgment and thought content normal.  Nursing note and vitals reviewed.    ED Treatments / Results  Labs (all labs ordered are listed, but only abnormal results are displayed) Labs Reviewed  URINALYSIS, ROUTINE W REFLEX MICROSCOPIC - Abnormal; Notable for the following components:      Result Value   APPearance CLOUDY (*)    Hgb urine dipstick LARGE (*)    Leukocytes, UA TRACE (*)    Bacteria, UA RARE (*)    Squamous Epithelial / LPF 0-5 (*)    All other components within normal limits    EKG  EKG Interpretation None       Radiology Dg Chest Port 1 View  Result Date: 05/28/2017 CLINICAL DATA:  Port-A-Cath insertion. EXAM: PORTABLE CHEST 1 VIEW COMPARISON:  Chest x-ray dated 12/17/2010 FINDINGS: New Port-A-Cath has been inserted. Tip is at the cavoatrial junction. No pneumothorax. Heart size and vascularity are normal. Lungs are clear. No acute bone abnormality. Old deformity of the left clavicle. Slight arthritic changes at the shoulders.  IMPRESSION: 1. Port-A-Cath appears in good position. 2. No pneumothorax. 3. Clear lungs. Electronically Signed   By: Lorriane Shire M.D.   On: 05/28/2017 09:45   Dg Fluoro Guide Cv Line-no Report  Result Date: 05/28/2017 Fluoroscopy was utilized by the requesting physician.  No radiographic interpretation.    Procedures Procedures (including critical care time)  Medications Ordered in ED Medications - No data to display   Initial Impression / Assessment and Plan / ED Course  I have reviewed the triage vital signs and the nursing notes.  Pertinent labs & imaging results that were available during my care of the patient were reviewed by me and considered in my medical decision making (see chart for details).     Patient with urinary retention.  Presumably from anesthesia.  Foley catheter placed in triage.  Patient feels well now.  He would like to go home.  Will leave foley in place and have him follow-up with either urology or her surgeon.  Discussed with Dr. Kathrynn Humble.  Final Clinical Impressions(s) / ED Diagnoses   Final diagnoses:  Urinary retention    ED Discharge Orders    None       Montine Circle, PA-C 05/29/17 [id number]    Varney Biles, MD 05/30/17 2332

## 2017-05-30 DIAGNOSIS — R1310 Dysphagia, unspecified: Secondary | ICD-10-CM | POA: Diagnosis not present

## 2017-05-30 DIAGNOSIS — C163 Malignant neoplasm of pyloric antrum: Secondary | ICD-10-CM | POA: Diagnosis not present

## 2017-05-30 DIAGNOSIS — C162 Malignant neoplasm of body of stomach: Secondary | ICD-10-CM | POA: Diagnosis not present

## 2017-05-31 ENCOUNTER — Encounter (HOSPITAL_BASED_OUTPATIENT_CLINIC_OR_DEPARTMENT_OTHER): Payer: Self-pay | Admitting: General Surgery

## 2017-06-02 ENCOUNTER — Telehealth: Payer: Self-pay | Admitting: Hematology

## 2017-06-02 ENCOUNTER — Encounter: Payer: Self-pay | Admitting: Nurse Practitioner

## 2017-06-02 ENCOUNTER — Inpatient Hospital Stay (HOSPITAL_BASED_OUTPATIENT_CLINIC_OR_DEPARTMENT_OTHER): Payer: Medicare HMO | Admitting: Nurse Practitioner

## 2017-06-02 ENCOUNTER — Inpatient Hospital Stay: Payer: Medicare HMO

## 2017-06-02 ENCOUNTER — Telehealth: Payer: Self-pay

## 2017-06-02 VITALS — BP 148/62 | HR 68 | Temp 97.4°F | Resp 16 | Wt 155.1 lb

## 2017-06-02 DIAGNOSIS — R918 Other nonspecific abnormal finding of lung field: Secondary | ICD-10-CM | POA: Diagnosis not present

## 2017-06-02 DIAGNOSIS — C162 Malignant neoplasm of body of stomach: Secondary | ICD-10-CM

## 2017-06-02 DIAGNOSIS — Z79899 Other long term (current) drug therapy: Secondary | ICD-10-CM | POA: Diagnosis not present

## 2017-06-02 DIAGNOSIS — I10 Essential (primary) hypertension: Secondary | ICD-10-CM | POA: Diagnosis not present

## 2017-06-02 DIAGNOSIS — D649 Anemia, unspecified: Secondary | ICD-10-CM

## 2017-06-02 DIAGNOSIS — E785 Hyperlipidemia, unspecified: Secondary | ICD-10-CM | POA: Diagnosis not present

## 2017-06-02 DIAGNOSIS — C163 Malignant neoplasm of pyloric antrum: Secondary | ICD-10-CM

## 2017-06-02 DIAGNOSIS — R339 Retention of urine, unspecified: Secondary | ICD-10-CM | POA: Diagnosis not present

## 2017-06-02 DIAGNOSIS — E876 Hypokalemia: Secondary | ICD-10-CM

## 2017-06-02 DIAGNOSIS — Z95828 Presence of other vascular implants and grafts: Secondary | ICD-10-CM | POA: Insufficient documentation

## 2017-06-02 DIAGNOSIS — C779 Secondary and unspecified malignant neoplasm of lymph node, unspecified: Secondary | ICD-10-CM | POA: Diagnosis not present

## 2017-06-02 DIAGNOSIS — Z5111 Encounter for antineoplastic chemotherapy: Secondary | ICD-10-CM | POA: Diagnosis not present

## 2017-06-02 LAB — CMP (CANCER CENTER ONLY)
ALBUMIN: 3.6 g/dL (ref 3.5–5.0)
ALK PHOS: 113 U/L (ref 40–150)
ALT: 31 U/L (ref 0–55)
ANION GAP: 7 (ref 3–11)
AST: 18 U/L (ref 5–34)
BILIRUBIN TOTAL: 0.6 mg/dL (ref 0.2–1.2)
BUN: 20 mg/dL (ref 7–26)
CALCIUM: 9.7 mg/dL (ref 8.4–10.4)
CO2: 27 mmol/L (ref 22–29)
CREATININE: 1.17 mg/dL (ref 0.70–1.30)
Chloride: 108 mmol/L (ref 98–109)
GFR, Est AFR Am: >60 mL/min (ref >60)
GFR, Estimated: >60 mL/min (ref >60)
GLUCOSE: 88 mg/dL (ref 70–140)
Potassium: 3.4 mmol/L — ABNORMAL LOW (ref 3.5–5.1)
SODIUM: 142 mmol/L (ref 136–145)
TOTAL PROTEIN: 6.8 g/dL (ref 6.4–8.3)

## 2017-06-02 LAB — CBC WITH DIFFERENTIAL (CANCER CENTER ONLY)
BASOS ABS: 0.1 10*3/uL (ref 0.0–0.1)
BASOS PCT: 1 %
Eosinophils Absolute: 0.4 10*3/uL (ref 0.0–0.5)
Eosinophils Relative: 5 %
HEMATOCRIT: 37.6 % — AB (ref 38.4–49.9)
Hemoglobin: 11.9 g/dL — ABNORMAL LOW (ref 13.0–17.1)
Lymphocytes Relative: 24 %
Lymphs Abs: 2.1 10*3/uL (ref 0.9–3.3)
MCH: 28.5 pg (ref 27.2–33.4)
MCHC: 31.6 g/dL — AB (ref 32.0–36.0)
MCV: 90 fL (ref 79.3–98.0)
MONO ABS: 0.5 10*3/uL (ref 0.1–0.9)
MONOS PCT: 5 %
NEUTROS ABS: 5.6 10*3/uL (ref 1.5–6.5)
Neutrophils Relative %: 65 %
PLATELETS: 292 10*3/uL (ref 140–400)
RBC: 4.18 MIL/uL — ABNORMAL LOW (ref 4.20–5.82)
RDW: 13.2 % (ref 11.0–15.6)
WBC Count: 8.5 10*3/uL (ref 4.0–10.3)

## 2017-06-02 MED ORDER — DEXAMETHASONE SODIUM PHOSPHATE 10 MG/ML IJ SOLN
10.0000 mg | Freq: Once | INTRAMUSCULAR | Status: AC
  Administered 2017-06-02: 10 mg via INTRAVENOUS

## 2017-06-02 MED ORDER — OXALIPLATIN CHEMO INJECTION 100 MG/20ML
81.0000 mg/m2 | Freq: Once | INTRAVENOUS | Status: AC
  Administered 2017-06-02: 150 mg via INTRAVENOUS
  Filled 2017-06-02: qty 30

## 2017-06-02 MED ORDER — FLUOROURACIL CHEMO INJECTION 2.5 GM/50ML
400.0000 mg/m2 | Freq: Once | INTRAVENOUS | Status: AC
  Administered 2017-06-02: 750 mg via INTRAVENOUS
  Filled 2017-06-02: qty 15

## 2017-06-02 MED ORDER — PALONOSETRON HCL INJECTION 0.25 MG/5ML
0.2500 mg | Freq: Once | INTRAVENOUS | Status: AC
  Administered 2017-06-02: 0.25 mg via INTRAVENOUS

## 2017-06-02 MED ORDER — SODIUM CHLORIDE 0.9 % IV SOLN: 10.0000 mg | Freq: Once | INTRAVENOUS | Status: DC

## 2017-06-02 MED ORDER — DEXTROSE 5 % IV SOLN
Freq: Once | INTRAVENOUS | Status: AC
  Administered 2017-06-02: 14:00:00 via INTRAVENOUS

## 2017-06-02 MED ORDER — SODIUM CHLORIDE 0.9% FLUSH
10.0000 mL | INTRAVENOUS | Status: DC | PRN
  Administered 2017-06-02: 10 mL via INTRAVENOUS
  Filled 2017-06-02: qty 10

## 2017-06-02 MED ORDER — LEUCOVORIN CALCIUM INJECTION 350 MG
400.0000 mg/m2 | Freq: Once | INTRAVENOUS | Status: AC
  Administered 2017-06-02: 744 mg via INTRAVENOUS
  Filled 2017-06-02: qty 37.2

## 2017-06-02 MED ORDER — FLUOROURACIL CHEMO INJECTION 5 GM/100ML
2400.0000 mg/m2 | INTRAVENOUS | Status: DC
  Administered 2017-06-02: 4450 mg via INTRAVENOUS
  Filled 2017-06-02: qty 89

## 2017-06-02 NOTE — Progress Notes (Signed)
[facility]  [phone] [phone]  Clinic Follow up Note   Patient Care Team: Octavio Graves, DO as PCP - General Truitt Merle, MD as Consulting Physician (Hematology) Alla Feeling, NP as Nurse Practitioner (Nurse Practitioner) Stark Klein, MD as Consulting Physician (General Surgery) Rogene Houston, MD as Consulting Physician (Gastroenterology) Milus Banister, MD as Attending Physician (Gastroenterology) Shea Evans [name], LCSW as [place] Management (Licensed Clinical Social Worker) Kassie Mends, RN as [place] [facility] 06/02/2017  CHIEF COMPLAINT: Follow up adenocarcinoma of gastric antrum   SUMMARY OF ONCOLOGIC HISTORY:   Malignant neoplasm of body of stomach ([place])   02/17/2017 Initial Diagnosis    Malignant neoplasm of body of stomach ([place])      02/17/2017 Pathology Results    Diagnosis Stomach, biopsy, gastric ulcer - ADENOCARCINOMA. Microscopic Comment Several of the fragments are involved by moderately differentiated adenocarcinoma.      02/17/2017 Procedure    UPPER ENDOSCOPY  FINDINGS - Normal esophagus. - Z-line irregular, 44 cm from the incisors. - Red blood in the gastric body and in the gastric antrum. - Large gastric ulcer. Biopsied. - Erythematous mucosa in the antrum. - Normal cardia, gastric fundus, gastric body and pylorus. - Normal duodenal bulb and second portion of the duodenum. Comment: Endoscopic appearence concerning for malignant ulcer.      03/11/2017 Procedure    UPPER EUS PER DR. Ardis Hughs Findings: 1. The esophagus was normal. 2. There was a 2-3cm ulcerated, malignant mass along the greater curvature of the stomach, approximately mid-body. The mass was non-circumferential. 3. The duodenum was normal. Endosonographic Finding 1. The gastric mass above correlated with a hypoechoic and heterogenous non-circumferential mass that measured 2.9cm  across, 62mm deep. The endosonographic borders were poorly defined and there was clear sonographic evidence suggesting invasion into and through the muscularis propria layer without evident invasion into nearby organs (uT3). 2. The duodenal lymphnode described on recent CT scan appeared reactive by [place] criteria. 3. No perigastric adenopathy (uN0) 4. Limited views of the liver, spleen, pancreas, bile duct, gallbladder were all normal. - Along the greater curvature of the stomach, approximately mid-body, there is a 2.9cm uT3N0 (clinical stage IIB) gastric adenocarcinoma.      04/06/2017 Imaging    CT CHEST IMPRESSION: 1. No acute cardiopulmonary abnormalities. 2. Two small pulmonary nodules are noted measuring up to 5 mm. Nonspecific but warrant attention on follow-up imaging follow up 3. Nonspecific hyperdense, possibly enhancing lesion along the dome of liver is identified. In a patient that is at increased risk for more definitive assessment of this structure with contrast enhanced MRI of the liver is advised.      04/21/2017 Imaging    MR ABDOMEN IMPRESSION: 1. Enhancing 2.9 cm mass along the lesser curvature in the gastric antrum, compatible with known gastric malignancy . 2. Mildly enlarged gastrohepatic ligament lymph node, cannot exclude nodal metastasis. 3. No definite liver metastatic disease. Hyperenhancing 0.9 cm liver dome mass remains inconclusive, although the MRI features are most suggestive of a flash filling hemangioma, and the mass has been stable for nearly 2 months. Additional subcentimeter focus of hyperenhancement in the lateral segment left liver lobe is most likely a benign transient vascular phenomenon. Recommend attention to these lesions on a follow-up MRI abdomen without and with IV contrast in 3-6 months. This recommendation follows ACR consensus guidelines: Management of Incidental Liver Lesions on CT: A White Paper of the ACR Incidental Findings  Committee.  J Am Coll Radiol 2017; 27:7412-8786. 4. Benign right adrenal adenoma.        04/22/2017 Pathology Results    Diagnosis 1. Liver, biopsy - BILE DUCT HAMARTOMA. - THERE IS NO EVIDENCE OF MALIGNANCY. 2. Lymph nodes, regional resection, portal - METASTATIC CARCINOMA IN 2 OF 6 LYMPH NODES (2/6). 3. Stomach, resection for tumor, distal - INVASIVE ADENOCARCINOMA, POORLY DIFFERENTIATED, SPANNING 3.8 CM. - PERINEURAL INVASION IS IDENTIFIED. - ADENOCARCINOMA INVOLVES THE SEROSA. - METASTATIC CARCINOMA IN 12 OF 30 LYMPH NODES (12/30), WITH EXTRACAPSULAR EXTENSION. - SEE ONCOLOGY TABLE BELOW. 4. Lymph node, biopsy, common hepatic artery - THERE IS NO EVIDENCE OF CARCINOMA IN 1 OF 1 LYMPH NODE (0/1). Microscopic Comment 3. STOMACH: Specimen: Stomach. Procedure: Partial gastrectomy. Tumor Site: Greater curvature. Tumor Size: 3.8 cm Histologic Type: Adenocarcinoma. Histologic Grade: G3: poorly differentiated. Microscopic Extent of Tumor: Adenocarcinoma involves the serosa. Margins (select all that apply): Adenoca Proximal Margin: Negative for adenocarcinoma. Distal Margin: Negative for adenocarcinoma. Treatment Effect: N/A Lymph-Vascular Invasion: Not identified. Perineural Invasion: Present. Additional findings: Chronic gastritis. Ancillary testing: Can be performed upon clinician request. 1 of 3 Duplicate copy FINAL for ZAKARI, BATHE ([id number]) Microscopic Comment(continued) Lymph nodes: number examined - 37; number positive: 14 Pathologic Staging: pT4a, pN3a (JBK:gt, 04/27/17)     CURRENT THERAPY: PENDING FOLFOX q2 weeks beginning 06/02/17   INTERVAL HISTORY: Mr. Genet returns as scheduled prior to beginning adjuvant FOLFOX chemotherapy. He had PAC placed 1/18 per Dr. Barry Dienes, he voided once after surgery then went home and was unable to void. He went to ED and foley catheter was placed. Has had post-op urinary retention in the past requiring temporary  catheter. Scheduled to f/u with [facility] and catheter removal in 2 days. Denies pain, hematuria, fever, or chills. He is otherwise doing well, denies abdominal pain, nausea, vomiting, constipation, or diarrhea. He has feeding tube clamped, not using it; getting all nutrition and hydration po. Drinking boost occasionally. He attended chemo class and has no f/u questions.   REVIEW OF SYSTEMS:   Constitutional: Denies fatigue, fevers, chills or abnormal weight loss Eyes: Denies blurriness of vision Ears, nose, mouth, throat, and face: Denies mucositis or sore throat Respiratory: Denies cough, dyspnea or wheezes Cardiovascular: Denies palpitation, chest discomfort or lower extremity swelling Gastrointestinal:  Denies nausea, vomiting, constipation, diarrhea, heartburn or change in bowel habits (+) feeding tube, clamped GU: (+) urinary retention after PAC placement (+) indwelling foley placed 1/19 (+) to f/u with [facility] 1/25 for catheter removal Skin: Denies abnormal skin rashes Lymphatics: Denies new lymphadenopathy or easy bruising Neurological:Denies numbness, tingling or new weaknesses Behavioral/Psych: Mood is stable, no new changes  All other systems were reviewed with the patient and are negative.  MEDICAL HISTORY:  Past Medical History:  Diagnosis Date  . Arthritis   . Cancer [facility]) dx oct 02-2017   stomach  . Gout   . Heart murmur   . Hyperlipidemia   . Hypertension   . Stomach cancer [facility])     SURGICAL HISTORY: Past Surgical History:  Procedure Laterality Date  . COLONOSCOPY    . ESOPHAGOGASTRODUODENOSCOPY N/A 02/17/2017   Procedure: ESOPHAGOGASTRODUODENOSCOPY (EGD);  Surgeon: Rogene Houston, MD;  [facility];  Service: Endoscopy;  Laterality: N/A;  3:00  . EUS N/A 03/11/2017   Procedure: UPPER ENDOSCOPIC ULTRASOUND (EUS) LINEAR;  Surgeon: Milus Banister, MD;  Location: [facility];  Service: Endoscopy;  Laterality: N/A;  . EUS N/A 03/11/2017     Procedure: UPPER ENDOSCOPIC  ULTRASOUND (EUS) RADIAL;  Surgeon: Milus Banister, MD;  Location: [facility];  Service: Endoscopy;  Laterality: N/A;  . FINGER SURGERY     Lt middle   . GASTRECTOMY N/A 04/22/2017   Procedure: PARTIAL GASTRECTOMY;  Surgeon: Stark Klein, MD;  Location: [place];  Service: General;  Laterality: N/A;  . GASTROJEJUNOSTOMY N/A 04/22/2017   Procedure: JEJUNAL FEEDING TUBE PLACEMENT;  Surgeon: Stark Klein, MD;  Location: [place];  Service: General;  Laterality: N/A;  . HYDROCELE EXCISION  02/12/2011   Procedure: HYDROCELECTOMY ADULT;  Surgeon: Marissa Nestle;  Location: [facility];  Service: Urology;  Laterality: Left;  . LAPAROSCOPY N/A 04/22/2017   Procedure: LAPAROSCOPY DIAGNOSTIC ERAS PATHWAY;  Surgeon: Stark Klein, MD;  Location: [place];  Service: General;  Laterality: N/A;  EPIDURAL  . PORTACATH PLACEMENT N/A 05/28/2017   Procedure: INSERTION PORT-A-CATH ERAS PATHWAY;  Surgeon: Stark Klein, MD;  Location: [place];  Service: General;  Laterality: N/A;    I have reviewed the social history and family history with the patient and they are unchanged from previous note.  ALLERGIES:  is allergic to penicillins.  MEDICATIONS:  Current Outpatient Medications  Medication Sig Dispense Refill  . aspirin EC 81 MG tablet Take 81 mg by mouth daily.    Marland Kitchen atorvastatin (LIPITOR) 40 MG tablet Take 1 tablet (40 mg total) by mouth daily at 6 PM. 90 tablet 0  . Cholecalciferol (VITAMIN D3) 5000 units CAPS Take 5,000 Units by mouth daily.    Marland Kitchen ENSURE PLUS (ENSURE PLUS) LIQD Give via J-tube at 11ml/hr for 12 hours.  Flush with 60ml of water before and after stopping feeding. 720 mL 0  . lidocaine-prilocaine (EMLA) cream Apply to affected area once 30 g 3  . metoprolol tartrate (LOPRESSOR) 50 MG tablet Take 50 mg by mouth 2 (two) times daily.    . ondansetron (ZOFRAN) 8 MG tablet Take 1 tablet (8 mg total) by mouth 2 (two) times daily as needed for refractory  nausea / vomiting. Start on day 3 after chemotherapy. 30 tablet 1  . oxyCODONE (OXY IR/ROXICODONE) 5 MG immediate release tablet Take 1-2 tablets (5-10 mg total) by mouth every 6 (six) hours as needed for moderate pain, severe pain or breakthrough pain. 15 tablet 0  . pantoprazole (PROTONIX) 40 MG tablet Take 1 tablet (40 mg total) by mouth 2 (two) times daily before a meal. 60 tablet 4  . prochlorperazine (COMPAZINE) 10 MG tablet Take 1 tablet (10 mg total) by mouth every 6 (six) hours as needed (Nausea or vomiting). 30 tablet 1  . sucralfate (CARAFATE) 1 g tablet Take 1 g by mouth 2 (two) times daily.    Marland Kitchen amLODipine (NORVASC) 10 MG tablet Take 1 tablet (10 mg total) by mouth daily. (Patient not taking: Reported on 06/02/2017) 90 tablet 0  . dexamethasone (DECADRON) 4 MG tablet Take 2 tablets (8 mg total) by mouth daily. Start the day after chemotherapy for 2 days. Take with food. (Patient not taking: Reported on 06/02/2017) 30 tablet 1   No current facility-administered medications for this visit.     PHYSICAL EXAMINATION: ECOG PERFORMANCE STATUS: 1 - Symptomatic but completely ambulatory  Vitals:   06/02/17 1312  BP: (!) 148/62  Pulse: 68  Resp: 16  Temp: (!) 97.4 F (36.3 C)  SpO2: 100%   Filed Weights   06/02/17 1312  Weight: 155 lb 1.6 oz (70.4 kg)    GENERAL:alert, no distress and comfortable SKIN: skin color,  texture, turgor are normal, no rashes or significant lesions EYES: normal, Conjunctiva are pink and non-injected, sclera clear OROPHARYNX:no exudate, no erythema and lips, buccal mucosa, and tongue normal  NECK: supple, thyroid normal size, non-tender, without nodularity LYMPH:  no palpable cervical, supraclavicular, axillary, or inguinal lymphadenopathy LUNGS: clear to auscultation bilaterally with normal breathing effort HEART: regular rate & rhythm and no murmurs and no lower extremity edema ABDOMEN:abdomen soft, non-tender and normal bowel sounds. (+) vertical  midline surgical incision is well healed (+) J tube to abdomen, clamped, site covered with gauze dressing c/d/i Musculoskeletal:no cyanosis of digits and no clubbing  NEURO: alert & oriented x 3 with fluent speech, no focal motor/sensory deficits PAC without erythema   LABORATORY DATA:  I have reviewed the data as listed CBC Latest Ref Rng & Units 06/02/2017 04/27/2017 04/26/2017  WBC 4.0 - 10.3 K/uL 8.5 13.0(H) 14.9(H)  Hemoglobin 13.0 - 17.0 g/dL - 11.2(L) 11.5(L)  Hematocrit 38.4 - 49.9 % 37.6(L) 34.2(L) 35.6(L)  Platelets 140 - 400 K/uL 292 265 256     CMP Latest Ref Rng & Units 06/02/2017 04/27/2017 04/26/2017  Glucose 70 - 140 mg/dL 88 114(H) 122(H)  BUN 7 - 26 mg/dL 20 17 13   Creatinine 0.61 - 1.24 mg/dL - 0.96 0.95  Sodium 136 - 145 mmol/L 142 136 134(L)  Potassium 3.5 - 5.1 mmol/L 3.4(L) 3.5 3.5  Chloride 98 - 109 mmol/L 108 104 101  CO2 22 - 29 mmol/L 27 23 24   Calcium 8.4 - 10.4 mg/dL 9.7 9.1 9.0  Total Protein 6.4 - 8.3 g/dL 6.8 - -  Total Bilirubin 0.2 - 1.2 mg/dL 0.6 - -  Alkaline Phos 40 - 150 U/L 113 - -  AST 5 - 34 U/L 18 - -  ALT 0 - 55 U/L 31 - -   PATHOLOGY  Diagnosis 02/17/17 Stomach, biopsy, gastric ulcer - ADENOCARCINOMA. Microscopic Comment Several of the fragments are involved by moderately differentiated adenocarcinoma.  Diagnosis 04/22/17 1. Liver, biopsy - BILE DUCT HAMARTOMA. - THERE IS NO EVIDENCE OF MALIGNANCY. 2. Lymph nodes, regional resection, portal - METASTATIC CARCINOMA IN 2 OF 6 LYMPH NODES (2/6). 3. Stomach, resection for tumor, distal - INVASIVE ADENOCARCINOMA, POORLY DIFFERENTIATED, SPANNING 3.8 CM. - PERINEURAL INVASION IS IDENTIFIED. - ADENOCARCINOMA INVOLVES THE SEROSA. - METASTATIC CARCINOMA IN 12 OF 30 LYMPH NODES (12/30), WITH EXTRACAPSULAR EXTENSION. - SEE ONCOLOGY TABLE BELOW. 4. Lymph node, biopsy, common hepatic artery - THERE IS NO EVIDENCE OF CARCINOMA IN 1 OF 1 LYMPH NODE (0/1). Microscopic Comment 3.  STOMACH: Specimen: Stomach. Procedure: Partial gastrectomy. Tumor Site: Greater curvature. Tumor Size: 3.8 cm Histologic Type: Adenocarcinoma. Histologic Grade: G3: poorly differentiated. Microscopic Extent of Tumor: Adenocarcinoma involves the serosa. Margins (select all that apply): Adenoca Proximal Margin: Negative for adenocarcinoma. Distal Margin: Negative for adenocarcinoma. Treatment Effect: N/A Lymph-Vascular Invasion: Not identified. Perineural Invasion: Present. Additional findings: Chronic gastritis. Ancillary testing: Can be performed upon clinician request. 1 of 3 Duplicate copy FINAL for KAYMON, DENOMME ([id number]) Microscopic Comment(continued) Lymph nodes: number examined - 37; number positive: 14 Pathologic Staging: pT4a, pN3a (JBK:gt, 04/27/17)   RADIOGRAPHIC STUDIES: I have personally reviewed the radiological images as listed and agreed with the findings in the report. No results found.   ASSESSMENT & PLAN: This is a wonderful 72 y.o. male who presents into the clinic today to discuss the following:   1. Primary adenocarcinoma of the pyloric antrum, pT4a, PN3a, Grade 3, stage IIIB 2. Anemia 3. Hypokalemia 4. Urinary retention  Mr. Jeanbaptiste appears stable today, he has recovered well from surgery. He underwent PAC placement and attended chemo class. S/p foley catheter placement for post-op urinary retention, will f/u with [facility] and catheter removal on 1/25. VS and weight stable, tolerating po nutrition. Encouraged to use J tube to supplement if his appetite decreases or he has difficulty keeping medication down while on chemotherapy. Labs reviewed, he is mildly hypokalemic on Cmet, K 3.4; I recommend he increase K in his diet. CBC with mild anemia slightly improved from 1 month ago, Hgb 11.9. May be related to neoplastic disease or nutritional deficiency. Will check iron studies next visit. In the meantime he will begin oral iron replacement daily.  We reviewed material he learned in chemo class such as potential chemo side effects, anti-emetics and symptom management, and signs/symptoms that should prompt him to call [facility] such as fever or uncontrolled GI symptoms, profound fatigue, or dehydration, etc. He understands. He will proceed with cycle 1 FOLFOX today, f/u in 2 weeks with cycle 2.   PLAN -Labs reviewed, proceed with cycle 1 FOLFOX -Return for f/u and cycle 2 in 2 weeks -Check Iron studies next visit -Begin oral iron supplement 1 tablet daily -Increase K in diet -Urology f/u this week for foley removal  All questions were answered. The patient knows to call the clinic with any problems, questions or concerns. No barriers to learning was detected.    Alla Feeling, NP 06/02/17

## 2017-06-02 NOTE — Patient Instructions (Signed)
[facility] Discharge Instructions for Patients Receiving Chemotherapy  Today you received the following chemotherapy agents:  Oxaliplatin, Leucovorin, and 5FU.  To help prevent nausea and vomiting after your treatment, we encourage you to take your nausea medication as directed.   If you develop nausea and vomiting that is not controlled by your nausea medication, call the clinic.   BELOW ARE SYMPTOMS THAT SHOULD BE REPORTED IMMEDIATELY:  *FEVER GREATER THAN 100.5 F  *CHILLS WITH OR WITHOUT FEVER  NAUSEA AND VOMITING THAT IS NOT CONTROLLED WITH YOUR NAUSEA MEDICATION  *UNUSUAL SHORTNESS OF BREATH  *UNUSUAL BRUISING OR BLEEDING  TENDERNESS IN MOUTH AND THROAT WITH OR WITHOUT PRESENCE OF ULCERS  *URINARY PROBLEMS  *BOWEL PROBLEMS  UNUSUAL RASH Items with * indicate a potential emergency and should be followed up as soon as possible.  Feel free to call the clinic should you have any questions or concerns. The clinic phone number is [phone].  Please show the CHEMO ALERT CARD at check-in to the Emergency Department and triage nurse.  Oxaliplatin Injection What is this medicine? OXALIPLATIN (ox AL i PLA tin) is a chemotherapy drug. It targets fast dividing cells, like cancer cells, and causes these cells to die. This medicine is used to treat cancers of the colon and rectum, and many other cancers. This medicine may be used for other purposes; ask your health care provider or pharmacist if you have questions. COMMON BRAND NAME(S): Eloxatin What should I tell my health care provider before I take this medicine? They need to know if you have any of these conditions: -kidney disease -an unusual or allergic reaction to oxaliplatin, other chemotherapy, other medicines, foods, dyes, or preservatives -pregnant or trying to get pregnant -breast-feeding How should I use this medicine? This drug is given as an infusion into a vein. It is administered in a hospital  or clinic by a specially trained health care professional. Talk to your pediatrician regarding the use of this medicine in children. Special care may be needed. Overdosage: If you think you have taken too much of this medicine contact a poison control center or emergency room at once. NOTE: This medicine is only for you. Do not share this medicine with others. What if I miss a dose? It is important not to miss a dose. Call your doctor or health care professional if you are unable to keep an appointment. What may interact with this medicine? -medicines to increase blood counts like filgrastim, pegfilgrastim, sargramostim -probenecid -some antibiotics like amikacin, gentamicin, neomycin, polymyxin B, streptomycin, tobramycin -zalcitabine Talk to your doctor or health care professional before taking any of these medicines: -acetaminophen -aspirin -ibuprofen -ketoprofen -naproxen This list may not describe all possible interactions. Give your health care provider a list of all the medicines, herbs, non-prescription drugs, or dietary supplements you use. Also tell them if you smoke, drink alcohol, or use illegal drugs. Some items may interact with your medicine. What should I watch for while using this medicine? Your condition will be monitored carefully while you are receiving this medicine. You will need important blood work done while you are taking this medicine. This medicine can make you more sensitive to cold. Do not drink cold drinks or use ice. Cover exposed skin before coming in contact with cold temperatures or cold objects. When out in cold weather wear warm clothing and cover your mouth and nose to warm the air that goes into your lungs. Tell your doctor if you get sensitive to the   cold. This drug may make you feel generally unwell. This is not uncommon, as chemotherapy can affect healthy cells as well as cancer cells. Report any side effects. Continue your course of treatment even  though you feel ill unless your doctor tells you to stop. In some cases, you may be given additional medicines to help with side effects. Follow all directions for their use. Call your doctor or health care professional for advice if you get a fever, chills or sore throat, or other symptoms of a cold or flu. Do not treat yourself. This drug decreases your body's ability to fight infections. Try to avoid being around people who are sick. This medicine may increase your risk to bruise or bleed. Call your doctor or health care professional if you notice any unusual bleeding. Be careful brushing and flossing your teeth or using a toothpick because you may get an infection or bleed more easily. If you have any dental work done, tell your dentist you are receiving this medicine. Avoid taking products that contain aspirin, acetaminophen, ibuprofen, naproxen, or ketoprofen unless instructed by your doctor. These medicines may hide a fever. Do not become pregnant while taking this medicine. Women should inform their doctor if they wish to become pregnant or think they might be pregnant. There is a potential for serious side effects to an unborn child. Talk to your health care professional or pharmacist for more information. Do not breast-feed an infant while taking this medicine. Call your doctor or health care professional if you get diarrhea. Do not treat yourself. What side effects may I notice from receiving this medicine? Side effects that you should report to your doctor or health care professional as soon as possible: -allergic reactions like skin rash, itching or hives, swelling of the face, lips, or tongue -low blood counts - This drug may decrease the number of white blood cells, red blood cells and platelets. You may be at increased risk for infections and bleeding. -signs of infection - fever or chills, cough, sore throat, pain or difficulty passing urine -signs of decreased platelets or bleeding -  bruising, pinpoint red spots on the skin, black, tarry stools, nosebleeds -signs of decreased red blood cells - unusually weak or tired, fainting spells, lightheadedness -breathing problems -chest pain, pressure -cough -diarrhea -jaw tightness -mouth sores -nausea and vomiting -pain, swelling, redness or irritation at the injection site -pain, tingling, numbness in the hands or feet -problems with balance, talking, walking -redness, blistering, peeling or loosening of the skin, including inside the mouth -trouble passing urine or change in the amount of urine Side effects that usually do not require medical attention (report to your doctor or health care professional if they continue or are bothersome): -changes in vision -constipation -hair loss -loss of appetite -metallic taste in the mouth or changes in taste -stomach pain This list may not describe all possible side effects. Call your doctor for medical advice about side effects. You may report side effects to FDA at 1-800-FDA-1088. Where should I keep my medicine? This drug is given in a hospital or clinic and will not be stored at home. NOTE: This sheet is a summary. It may not cover all possible information. If you have questions about this medicine, talk to your doctor, pharmacist, or health care provider.  2018 Elsevier/Gold Standard (2007-11-22 17:22:47)  Leucovorin injection What is this medicine? LEUCOVORIN (loo koe VOR in) is used to prevent or treat the harmful effects of some medicines. This medicine is used to   treat anemia caused by a low amount of folic acid in the body. It is also used with 5-fluorouracil (5-FU) to treat colon cancer. This medicine may be used for other purposes; ask your health care provider or pharmacist if you have questions. What should I tell my health care provider before I take this medicine? They need to know if you have any of these conditions: -anemia from low levels of vitamin B-12 in  the blood -an unusual or allergic reaction to leucovorin, folic acid, other medicines, foods, dyes, or preservatives -pregnant or trying to get pregnant -breast-feeding How should I use this medicine? This medicine is for injection into a muscle or into a vein. It is given by a health care professional in a hospital or clinic setting. Talk to your pediatrician regarding the use of this medicine in children. Special care may be needed. Overdosage: If you think you have taken too much of this medicine contact a poison control center or emergency room at once. NOTE: This medicine is only for you. Do not share this medicine with others. What if I miss a dose? This does not apply. What may interact with this medicine? -capecitabine -fluorouracil -phenobarbital -phenytoin -primidone -trimethoprim-sulfamethoxazole This list may not describe all possible interactions. Give your health care provider a list of all the medicines, herbs, non-prescription drugs, or dietary supplements you use. Also tell them if you smoke, drink alcohol, or use illegal drugs. Some items may interact with your medicine. What should I watch for while using this medicine? Your condition will be monitored carefully while you are receiving this medicine. This medicine may increase the side effects of 5-fluorouracil, 5-FU. Tell your doctor or health care professional if you have diarrhea or mouth sores that do not get better or that get worse. What side effects may I notice from receiving this medicine? Side effects that you should report to your doctor or health care professional as soon as possible: -allergic reactions like skin rash, itching or hives, swelling of the face, lips, or tongue -breathing problems -fever, infection -mouth sores -unusual bleeding or bruising -unusually weak or tired Side effects that usually do not require medical attention (report to your doctor or health care professional if they continue or  are bothersome): -constipation or diarrhea -loss of appetite -nausea, vomiting This list may not describe all possible side effects. Call your doctor for medical advice about side effects. You may report side effects to FDA at 1-800-FDA-1088. Where should I keep my medicine? This drug is given in a hospital or clinic and will not be stored at home. NOTE: This sheet is a summary. It may not cover all possible information. If you have questions about this medicine, talk to your doctor, pharmacist, or health care provider.  2018 Elsevier/Gold Standard (2007-11-01 16:50:29)  Fluorouracil, 5-FU injection What is this medicine? FLUOROURACIL, 5-FU (flure oh YOOR a sil) is a chemotherapy drug. It slows the growth of cancer cells. This medicine is used to treat many types of cancer like breast cancer, colon or rectal cancer, pancreatic cancer, and stomach cancer. This medicine may be used for other purposes; ask your health care provider or pharmacist if you have questions. COMMON BRAND NAME(S): Adrucil What should I tell my health care provider before I take this medicine? They need to know if you have any of these conditions: -blood disorders -dihydropyrimidine dehydrogenase (DPD) deficiency -infection (especially a virus infection such as chickenpox, cold sores, or herpes) -kidney disease -liver disease -malnourished, poor nutrition -recent   or ongoing radiation therapy -an unusual or allergic reaction to fluorouracil, other chemotherapy, other medicines, foods, dyes, or preservatives -pregnant or trying to get pregnant -breast-feeding How should I use this medicine? This drug is given as an infusion or injection into a vein. It is administered in a hospital or clinic by a specially trained health care professional. Talk to your pediatrician regarding the use of this medicine in children. Special care may be needed. Overdosage: If you think you have taken too much of this medicine contact a  poison control center or emergency room at once. NOTE: This medicine is only for you. Do not share this medicine with others. What if I miss a dose? It is important not to miss your dose. Call your doctor or health care professional if you are unable to keep an appointment. What may interact with this medicine? -allopurinol -cimetidine -dapsone -digoxin -hydroxyurea -leucovorin -levamisole -medicines for seizures like ethotoin, fosphenytoin, phenytoin -medicines to increase blood counts like filgrastim, pegfilgrastim, sargramostim -medicines that treat or prevent blood clots like warfarin, enoxaparin, and dalteparin -methotrexate -metronidazole -pyrimethamine -some other chemotherapy drugs like busulfan, cisplatin, estramustine, vinblastine -trimethoprim -trimetrexate -vaccines Talk to your doctor or health care professional before taking any of these medicines: -acetaminophen -aspirin -ibuprofen -ketoprofen -naproxen This list may not describe all possible interactions. Give your health care provider a list of all the medicines, herbs, non-prescription drugs, or dietary supplements you use. Also tell them if you smoke, drink alcohol, or use illegal drugs. Some items may interact with your medicine. What should I watch for while using this medicine? Visit your doctor for checks on your progress. This drug may make you feel generally unwell. This is not uncommon, as chemotherapy can affect healthy cells as well as cancer cells. Report any side effects. Continue your course of treatment even though you feel ill unless your doctor tells you to stop. In some cases, you may be given additional medicines to help with side effects. Follow all directions for their use. Call your doctor or health care professional for advice if you get a fever, chills or sore throat, or other symptoms of a cold or flu. Do not treat yourself. This drug decreases your body's ability to fight infections. Try to  avoid being around people who are sick. This medicine may increase your risk to bruise or bleed. Call your doctor or health care professional if you notice any unusual bleeding. Be careful brushing and flossing your teeth or using a toothpick because you may get an infection or bleed more easily. If you have any dental work done, tell your dentist you are receiving this medicine. Avoid taking products that contain aspirin, acetaminophen, ibuprofen, naproxen, or ketoprofen unless instructed by your doctor. These medicines may hide a fever. Do not become pregnant while taking this medicine. Women should inform their doctor if they wish to become pregnant or think they might be pregnant. There is a potential for serious side effects to an unborn child. Talk to your health care professional or pharmacist for more information. Do not breast-feed an infant while taking this medicine. Men should inform their doctor if they wish to father a child. This medicine may lower sperm counts. Do not treat diarrhea with over the counter products. Contact your doctor if you have diarrhea that lasts more than 2 days or if it is severe and watery. This medicine can make you more sensitive to the sun. Keep out of the sun. If you cannot avoid being in the   sun, wear protective clothing and use sunscreen. Do not use sun lamps or tanning beds/booths. What side effects may I notice from receiving this medicine? Side effects that you should report to your doctor or health care professional as soon as possible: -allergic reactions like skin rash, itching or hives, swelling of the face, lips, or tongue -low blood counts - this medicine may decrease the number of white blood cells, red blood cells and platelets. You may be at increased risk for infections and bleeding. -signs of infection - fever or chills, cough, sore throat, pain or difficulty passing urine -signs of decreased platelets or bleeding - bruising, pinpoint red spots  on the skin, black, tarry stools, blood in the urine -signs of decreased red blood cells - unusually weak or tired, fainting spells, lightheadedness -breathing problems -changes in vision -chest pain -mouth sores -nausea and vomiting -pain, swelling, redness at site where injected -pain, tingling, numbness in the hands or feet -redness, swelling, or sores on hands or feet -stomach pain -unusual bleeding Side effects that usually do not require medical attention (report to your doctor or health care professional if they continue or are bothersome): -changes in finger or toe nails -diarrhea -dry or itchy skin -hair loss -headache -loss of appetite -sensitivity of eyes to the light -stomach upset -unusually teary eyes This list may not describe all possible side effects. Call your doctor for medical advice about side effects. You may report side effects to FDA at 1-800-FDA-1088. Where should I keep my medicine? This drug is given in a hospital or clinic and will not be stored at home. NOTE: This sheet is a summary. It may not cover all possible information. If you have questions about this medicine, talk to your doctor, pharmacist, or health care provider.  2018 Elsevier/Gold Standard (2007-08-31 13:53:16)    

## 2017-06-02 NOTE — Telephone Encounter (Signed)
Gave avs and calendar for 2/20 waiting for Dr to tell me about time on 2/6

## 2017-06-02 NOTE — Telephone Encounter (Signed)
Nutrition  Called patient this am for nutrition follow-up but patient did not answer and voice mail box has not been set up.  Will follow-up at later date for nutrition follow-up.  Sherryl Valido B. Zenia Resides, [place], [place] Registered Dietitian [phone] (pager)

## 2017-06-02 NOTE — Telephone Encounter (Signed)
Left message on voicemail for patient with Appointment Date/Time per sched message 1/23

## 2017-06-03 ENCOUNTER — Telehealth: Payer: Self-pay | Admitting: Hematology

## 2017-06-03 NOTE — Telephone Encounter (Signed)
Called regarding 2/6 °

## 2017-06-04 ENCOUNTER — Inpatient Hospital Stay (HOSPITAL_BASED_OUTPATIENT_CLINIC_OR_DEPARTMENT_OTHER): Payer: Medicare HMO

## 2017-06-04 ENCOUNTER — Encounter: Payer: Self-pay | Admitting: Licensed Clinical Social Worker

## 2017-06-04 ENCOUNTER — Other Ambulatory Visit: Payer: Self-pay | Admitting: Licensed Clinical Social Worker

## 2017-06-04 ENCOUNTER — Other Ambulatory Visit: Payer: Self-pay | Admitting: *Deleted

## 2017-06-04 VITALS — BP 145/80 | HR 70 | Temp 97.9°F | Resp 18

## 2017-06-04 DIAGNOSIS — R918 Other nonspecific abnormal finding of lung field: Secondary | ICD-10-CM

## 2017-06-04 DIAGNOSIS — D649 Anemia, unspecified: Secondary | ICD-10-CM | POA: Diagnosis not present

## 2017-06-04 DIAGNOSIS — C779 Secondary and unspecified malignant neoplasm of lymph node, unspecified: Secondary | ICD-10-CM | POA: Diagnosis not present

## 2017-06-04 DIAGNOSIS — R339 Retention of urine, unspecified: Secondary | ICD-10-CM

## 2017-06-04 DIAGNOSIS — E876 Hypokalemia: Secondary | ICD-10-CM | POA: Diagnosis not present

## 2017-06-04 DIAGNOSIS — Z5111 Encounter for antineoplastic chemotherapy: Secondary | ICD-10-CM | POA: Diagnosis not present

## 2017-06-04 DIAGNOSIS — Z79899 Other long term (current) drug therapy: Secondary | ICD-10-CM

## 2017-06-04 DIAGNOSIS — E785 Hyperlipidemia, unspecified: Secondary | ICD-10-CM | POA: Diagnosis not present

## 2017-06-04 DIAGNOSIS — C163 Malignant neoplasm of pyloric antrum: Secondary | ICD-10-CM | POA: Diagnosis not present

## 2017-06-04 DIAGNOSIS — C162 Malignant neoplasm of body of stomach: Secondary | ICD-10-CM

## 2017-06-04 DIAGNOSIS — I10 Essential (primary) hypertension: Secondary | ICD-10-CM | POA: Diagnosis not present

## 2017-06-04 MED ORDER — HEPARIN SOD (PORK) LOCK FLUSH 100 UNIT/ML IV SOLN
500.0000 [IU] | Freq: Once | INTRAVENOUS | Status: AC | PRN
  Administered 2017-06-04: 500 [IU]
  Filled 2017-06-04: qty 5

## 2017-06-04 MED ORDER — OXYCODONE HCL 5 MG PO TABS: 5.0000 mg | ORAL_TABLET | Freq: Four times a day (QID) | ORAL | 0 refills | Status: DC | PRN

## 2017-06-04 MED ORDER — SODIUM CHLORIDE 0.9% FLUSH
10.0000 mL | INTRAVENOUS | Status: DC | PRN
  Administered 2017-06-04: 10 mL
  Filled 2017-06-04: qty 10

## 2017-06-04 NOTE — Telephone Encounter (Signed)
Pt here for pump d/c & req refill on pain med. He states he takes 2-3/day.  Script OK with Dr Burr Medico & given to pt.

## 2017-06-04 NOTE — Patient Outreach (Signed)
Assessment:  CSW received referral on Gabriel Poole. CSW completed chart review on client on 06/04/17.  Client sees Gabriel Poole as primary care provider for client. Client resides at home with his girlfriend.  Gabriel Poole, is ex-wife of client;and, Gabriel Poole is listed as the emegency contact for client. Recently, Gabriel Billings RN, spoke both with client and with Gabriel Poole about client status and needs. Client often does not answer his home phone and has asked that Gabriel Poole, his ex-wife receive messages and information for client and relate this information to client. RN Gabriel Poole has also had difficulty speaking via phone with client. Gabriel Larsen RN has also spoken via phone with Gabriel Poole regarding client needs and status.  Gabriel Larsen RN has been trying to set up appointment for home visit with client.  CSW called client home phone number on 06/04/17. CSW was able to speak via phone with client on 06/04/17. CSW verified client identity. CSW received verbal permission from client on 06/04/17 for CSW to speak with client or with Gabriel Poole about client needs and status.  Client spoke of his recent cancer diagnosis.  He said that Gabriel Poole, his ex-wife, has been transporting client to and from client's medical appointments and cancer treatments. CSW and client completed needed [facility] assessments for client. CSW spoke with client about [place] [facility] program support related to transport assistance through volunteer group.  CSW spoke with client about client's activities of daily living. Client said he is functioning fairly well but does sometimes have reduced energy. He said that his scheduled cancer treatments will last several months. He said Gabriel Poole, his ex-wife, is scheduled for surgery February 25,2019. Thus client is trying to learn of transport resources for client in the area to help him go to scheduled client cancer  treatments in [place], [place].  [place] talked with client about client care plan. CSW encouraged that client communicate with CSW in next 30 days to discuss transport resources for client in the area. CSW spoke with client about [facility] program support in nursing, social work and pharmacy areas. CSW thanked client for phone call with CSW on 06/04/17. CSW encouraged client to call CSW at [phone] as needed to discuss social work needs of client.    Plan:  Client to communicate with CSW in next 30 days to discuss transport resources for client in the area.   CSW to collaborate with RN Gabriel Poole, as needed, in monitoring needs of client.   CSW to call client in 3 weeks to assess client needs at that time.  Gabriel Poole.Gabriel Poole MSW, LCSW Licensed Clinical Social Worker [facility] Care Management [phone]

## 2017-06-07 DIAGNOSIS — R972 Elevated prostate specific antigen [PSA]: Secondary | ICD-10-CM | POA: Diagnosis not present

## 2017-06-07 DIAGNOSIS — R351 Nocturia: Secondary | ICD-10-CM | POA: Diagnosis not present

## 2017-06-07 DIAGNOSIS — R338 Other retention of urine: Secondary | ICD-10-CM | POA: Diagnosis not present

## 2017-06-08 ENCOUNTER — Encounter: Payer: Self-pay | Admitting: *Deleted

## 2017-06-08 ENCOUNTER — Other Ambulatory Visit: Payer: Self-pay | Admitting: *Deleted

## 2017-06-08 NOTE — Patient Outreach (Signed)
Gabriel Poole [facility]) Care Management   06/08/2017  Gabriel Poole 1945-12-26 [id number]  Gabriel Poole is an 72 y.o. male  Subjective: Initial home visit with pt, HIPAA verified, pt lives with girlfriend who is not active in his care, she does not have a driver's license and pt does not have a car, per ex-wife caregiver [name] she is the primary caregiver for transportation and assisting with appointments and she will be having surgery 07/05/17 and will no longer be assisting, [name] states they have checked with [facility] about transportation but this is a limited resource even if they agree to assist, they have also checked with [place] locally about transportation and checked with members of patient's church, pt states he cannot ride [employer] to [place] as that is out of their area, [facility] CSW is involved for transportation. Pt states he is independent with almost all activities.  Objective:   Vitals:   06/08/17 1243  BP: 120/64  Pulse: 70  Resp: 18  SpO2: 98%  Weight: 155 lb (70.3 kg)  Height: 1.778 m (5\' 10" )   ROS  Physical Exam  Constitutional: He is oriented to person, place, and time. He appears well-developed and well-nourished.  HENT:  Head: Normocephalic.  Neck: Normal range of motion. Neck supple.  Cardiovascular: Normal rate.  Irregular rhythm  Respiratory: Effort normal and breath sounds normal.  GI: Soft. Bowel sounds are normal.  Musculoskeletal: Normal range of motion. He exhibits no edema.  Neurological: He is alert and oriented to person, place, and time.  Skin: Skin is warm and dry.  Psychiatric: He has a normal mood and affect. His behavior is normal. Thought content normal.    Encounter Medications:   Outpatient Encounter Medications as of 06/08/2017  Medication Sig  . amLODipine (NORVASC) 10 MG tablet Take 1 tablet (10 mg total) by mouth daily.  Marland Kitchen aspirin EC 81 MG tablet Take 81 mg by mouth daily.  Marland Kitchen atorvastatin  (LIPITOR) 40 MG tablet Take 1 tablet (40 mg total) by mouth daily at 6 PM.  . Cholecalciferol (VITAMIN D3) 5000 units CAPS Take 5,000 Units by mouth daily.  Marland Kitchen dexamethasone (DECADRON) 4 MG tablet Take 2 tablets (8 mg total) by mouth daily. Start the day after chemotherapy for 2 days. Take with food.  . lidocaine-prilocaine (EMLA) cream Apply to affected area once  . metoprolol tartrate (LOPRESSOR) 50 MG tablet Take 50 mg by mouth 2 (two) times daily.  Marland Kitchen omeprazole (PRILOSEC) 20 MG capsule Take 20 mg by mouth daily.  . ondansetron (ZOFRAN) 8 MG tablet Take 1 tablet (8 mg total) by mouth 2 (two) times daily as needed for refractory nausea / vomiting. Start on day 3 after chemotherapy.  Marland Kitchen oxyCODONE (OXY IR/ROXICODONE) 5 MG immediate release tablet Take 1-2 tablets (5-10 mg total) by mouth every 6 (six) hours as needed for moderate pain, severe pain or breakthrough pain.  . pantoprazole (PROTONIX) 40 MG tablet Take 1 tablet (40 mg total) by mouth 2 (two) times daily before a meal.  . prochlorperazine (COMPAZINE) 10 MG tablet Take 1 tablet (10 mg total) by mouth every 6 (six) hours as needed (Nausea or vomiting).  . ranitidine (ZANTAC) 300 MG tablet Take 300 mg by mouth at bedtime.  . sucralfate (CARAFATE) 1 g tablet Take 1 g by mouth 2 (two) times daily.  Marland Kitchen triamterene-hydrochlorothiazide (MAXZIDE-25) 37.5-25 MG tablet Take 1 tablet by mouth daily.  Marland Kitchen ENSURE PLUS (ENSURE PLUS) LIQD Give via J-tube  at 57ml/hr for 12 hours.  Flush with 69ml of water before and after stopping feeding. (Patient not taking: Reported on 06/08/2017)   No facility-administered encounter medications on file as of 06/08/2017.     Functional Status:   In your present state of health, do you have any difficulty performing the following activities: 06/08/2017 06/04/2017  Hearing? N N  Vision? N N  Difficulty concentrating or making decisions? N N  Walking or climbing stairs? N N  Dressing or bathing? N N  Doing errands,  shopping? N N  Preparing Food and eating ? N Y  Using the Toilet? N N  In the past six months, have you accidently leaked urine? N N  Do you have problems with loss of bowel control? N N  Managing your Medications? N N  Managing your Finances? N N  Housekeeping or managing your Housekeeping? N Y  Some recent data might be hidden    Fall/Depression Screening:    Fall Risk  06/08/2017 06/04/2017 08/21/2015  Falls in the past year? No No No  Risk for fall due to : Medication side effect Medication side effect -   PHQ 2/9 Scores 06/08/2017 06/04/2017 05/26/2017 08/21/2015 05/31/2015 05/08/2015 03/07/2015  PHQ - 2 Score 0 0 0 0 0 0 2  PHQ- 9 Score - - - - - - 9    Assessment:  Pt usually works 30-35 hours per week at [employer] and is temporarily not working due to receiving chemotherapy.  Pt has multiple people living at the house he rents, people coming in and out, the situation is questionable and per caregiver, pt allows this behavior and supports all of these people financially and patient's girl friend has lived there 10 years.  Pt states to RN CM that he does not smoke and caregiver states pt does continue to smoke.  According to caregiver, pt is utilizing as many resources as possible through cancer center and elsewhere.  RN CM faxed barrier letter and initial home visit to primary MD Octavio Graves.  [facility] CM Care Plan Problem One     Most Recent Value  Care Plan for Problem One  Active  [facility] Long Term Goal   Pt will verbalize/ demonstrate improved self care for new diagnosis cancer within 60 days  [facility]N Long Term Goal Start Date  06/08/17  Interventions for Problem One Long Term Goal  RN CM reviewed medications with pt, did not observe oxycodone pain medication bottle, pt states he has pain medication but did not let RN CM see the bottle, Gave [facility] calendar and reviewed resources, gave 24 hour advice line magnet, gave EMMI handouts and briefly reviewed and ask pt to read over the material.  [facility] CM  Short Term Goal #1   Pt will verbalize signs/ symptoms of infection and reportable signs/ symptoms within 30 days  [facility] CM Short Term Goal #1 Start Date  06/08/17  Interventions for Short Term Goal #1  RN CM reviewed importance of avoiding sick persons, utilizing good handwashing, reviewed reporting change in symptoms to doctor early, reviewed signs/ symptom of infection.  [facility] CM Short Term Goal #2   Pt will work with [facility] CSW and cancer center for resources with transportation within 30 days.  [facility] CM Short Term Goal #2 Start Date  06/08/17  Interventions for Short Term Goal #2  RN CM talked with caregiver Aaditya Letizia about resources for transportation such as [facility] that will provide gas vouchers, patient's church is  posting a sign up sheet for volunteers, pt will be transported by caregiver [name] until 07/05/17 when she has surgery.  Rn CM called [facility] CSW [employer] and gave update from today's visit and resources pt has explored, reported [employer] cannot transport pt 26 miles away and [facility] does not go into [facility] [place].      Plan: see pt for home visit next month Collaborate with CSW as needed  Jacqlyn Larsen [facility] [place], [place] Coordinator [phone]

## 2017-06-09 DIAGNOSIS — I10 Essential (primary) hypertension: Secondary | ICD-10-CM | POA: Diagnosis not present

## 2017-06-09 DIAGNOSIS — E46 Unspecified protein-calorie malnutrition: Secondary | ICD-10-CM | POA: Diagnosis not present

## 2017-06-09 DIAGNOSIS — Z903 Acquired absence of stomach [part of]: Secondary | ICD-10-CM | POA: Diagnosis not present

## 2017-06-09 DIAGNOSIS — C163 Malignant neoplasm of pyloric antrum: Secondary | ICD-10-CM | POA: Diagnosis not present

## 2017-06-09 DIAGNOSIS — Z483 Aftercare following surgery for neoplasm: Secondary | ICD-10-CM | POA: Diagnosis not present

## 2017-06-09 DIAGNOSIS — C162 Malignant neoplasm of body of stomach: Secondary | ICD-10-CM | POA: Diagnosis not present

## 2017-06-09 DIAGNOSIS — F17200 Nicotine dependence, unspecified, uncomplicated: Secondary | ICD-10-CM | POA: Diagnosis not present

## 2017-06-09 DIAGNOSIS — Z434 Encounter for attention to other artificial openings of digestive tract: Secondary | ICD-10-CM | POA: Diagnosis not present

## 2017-06-09 DIAGNOSIS — E785 Hyperlipidemia, unspecified: Secondary | ICD-10-CM | POA: Diagnosis not present

## 2017-06-11 ENCOUNTER — Other Ambulatory Visit: Payer: Self-pay | Admitting: Hematology

## 2017-06-12 ENCOUNTER — Encounter (HOSPITAL_COMMUNITY): Payer: Self-pay | Admitting: Emergency Medicine

## 2017-06-12 ENCOUNTER — Emergency Department (HOSPITAL_COMMUNITY)
Admission: EM | Admit: 2017-06-12 | Discharge: 2017-06-12 | Disposition: A | Discharge status: Home / Self Care | Payer: Medicare HMO | Attending: Emergency Medicine | Admitting: Emergency Medicine

## 2017-06-12 DIAGNOSIS — I10 Essential (primary) hypertension: Secondary | ICD-10-CM | POA: Insufficient documentation

## 2017-06-12 DIAGNOSIS — Z79899 Other long term (current) drug therapy: Secondary | ICD-10-CM | POA: Diagnosis not present

## 2017-06-12 DIAGNOSIS — C162 Malignant neoplasm of body of stomach: Secondary | ICD-10-CM | POA: Diagnosis not present

## 2017-06-12 DIAGNOSIS — Z7982 Long term (current) use of aspirin: Secondary | ICD-10-CM | POA: Insufficient documentation

## 2017-06-12 DIAGNOSIS — R066 Hiccough: Secondary | ICD-10-CM | POA: Diagnosis not present

## 2017-06-12 DIAGNOSIS — F17210 Nicotine dependence, cigarettes, uncomplicated: Secondary | ICD-10-CM | POA: Insufficient documentation

## 2017-06-12 DIAGNOSIS — K219 Gastro-esophageal reflux disease without esophagitis: Secondary | ICD-10-CM | POA: Insufficient documentation

## 2017-06-12 DIAGNOSIS — Z85028 Personal history of other malignant neoplasm of stomach: Secondary | ICD-10-CM | POA: Insufficient documentation

## 2017-06-12 MED ORDER — DIAZEPAM 2 MG PO TABS: 2.0000 mg | ORAL_TABLET | Freq: Two times a day (BID) | ORAL | 0 refills | Status: AC | PRN

## 2017-06-12 MED ORDER — DIAZEPAM 2 MG PO TABS
2.0000 mg | ORAL_TABLET | Freq: Once | ORAL | Status: AC
  Administered 2017-06-12: 2 mg via ORAL
  Filled 2017-06-12: qty 1

## 2017-06-12 NOTE — ED Provider Notes (Signed)
Gabriel Poole  Gabriel Poole Arrival date & time: 06/12/17 1143  Chief Complaint(s) Hiccups and Gastroesophageal Reflux  HPI Gabriel Poole is a 72 y.o. male with a history of gastric cancer status post resection currently on chemotherapy.  The history is provided by the patient.  Gastroesophageal Reflux  This is a recurrent problem. Episode onset: since chemoTx started 2 weeks ago. The problem occurs daily. The problem has not changed since onset.Pertinent negatives include no chest pain, no abdominal pain, no headaches and no shortness of breath. The symptoms are aggravated by eating. Nothing relieves the symptoms. Treatments tried: prilosec. The treatment provided mild relief.   Also complaining of hiccups that started at the same time.  He is still able to tolerated PO intake. Denies abd pain and emesis.   Past Medical History Past Medical History:  Diagnosis Date  . Arthritis   . Cancer [facility]) dx oct 02-2017   stomach  . Gout   . Heart murmur   . Hyperlipidemia   . Hypertension   . Stomach cancer [facility])    Patient Active Problem List   Diagnosis Date Noted  . Port-A-Cath in place 06/02/2017  . 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 Malignant neoplasm of body of stomach ([place])   . Abdominal pain, epigastric 02/01/2017  . 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 Pain of right lower leg 11/29/2014  . Elevated PSA, less than 10 ng/ml 10/31/2014  . Noncompliance 09/21/2012  . HTN (hypertension) 09/21/2012  . HLD (hyperlipidemia) 09/21/2012  . Gout 09/21/2012  . Hydrocele 12/17/2010  . Tobacco abuse 12/17/2010   Home Medication(s) Prior to Admission medications   Medication Sig Start Date End Date Taking? Authorizing Provider  amLODipine (NORVASC) 10 MG tablet Take 1 tablet (10 mg total) by mouth daily. 12/04/15   Claretta Fraise, MD  aspirin EC 81 MG tablet Take 81 mg by  mouth daily.    [provider]  atorvastatin (LIPITOR) 40 MG tablet Take 1 tablet (40 mg total) by mouth daily at 6 PM. 05/08/15   Dettinger, Fransisca Kaufmann, MD  Cholecalciferol (VITAMIN D3) 5000 units CAPS Take 5,000 Units by mouth daily.    [provider]  dexamethasone (DECADRON) 4 MG tablet Take 2 tablets (8 mg total) by mouth daily. Start the day after chemotherapy for 2 days. Take with food. 05/26/17   Truitt Merle, MD  diazepam (VALIUM) 2 MG tablet Take 1 tablet (2 mg total) by mouth every 12 (twelve) hours as needed for up to 10 days (hiccups). 06/12/17 06/22/17  Fatima Blank, MD  ENSURE PLUS (ENSURE PLUS) LIQD Give via J-tube at 74ml/hr for 12 hours.  Flush with 19ml of water before and after stopping feeding. Patient not taking: Reported on 06/08/2017 05/18/17   Alla Feeling, NP  lidocaine-prilocaine (EMLA) cream Apply to affected area once 05/26/17   Truitt Merle, MD  metoprolol tartrate (LOPRESSOR) 50 MG tablet Take 50 mg by mouth 2 (two) times daily.    [provider]  omeprazole (PRILOSEC) 20 MG capsule Take 20 mg by mouth daily.    [provider]  ondansetron (ZOFRAN) 8 MG tablet Take 1 tablet (8 mg total) by mouth 2 (two) times daily as needed for refractory nausea / vomiting. Start on day 3 after chemotherapy. 05/26/17   Truitt Merle, MD  oxyCODONE (OXY IR/ROXICODONE) 5 MG immediate release tablet Take 1-2 tablets (5-10 mg total) by mouth every  6 (six) hours as needed for moderate pain, severe pain or breakthrough pain. 06/04/17   Truitt Merle, MD  pantoprazole (PROTONIX) 40 MG tablet Take 1 tablet (40 mg total) by mouth 2 (two) times daily before a meal. 02/01/17   Setzer, Rona Ravens, NP  prochlorperazine (COMPAZINE) 10 MG tablet Take 1 tablet (10 mg total) by mouth every 6 (six) hours as needed (Nausea or vomiting). 05/26/17   Truitt Merle, MD  ranitidine (ZANTAC) 300 MG tablet Take 300 mg by mouth at bedtime.    [provider]  sucralfate (CARAFATE) 1 g  tablet Take 1 g by mouth 2 (two) times daily.    [provider]  triamterene-hydrochlorothiazide (MAXZIDE-25) 37.5-25 MG tablet Take 1 tablet by mouth daily.    [provider]                                                                                                                                    Past Surgical History Past Surgical History:  Procedure Laterality Date  . COLONOSCOPY    . ESOPHAGOGASTRODUODENOSCOPY N/A 02/17/2017   Procedure: ESOPHAGOGASTRODUODENOSCOPY (EGD);  Surgeon: Rogene Houston, MD;  [facility];  Service: Endoscopy;  Laterality: N/A;  3:00  . EUS N/A 03/11/2017   Procedure: UPPER ENDOSCOPIC ULTRASOUND (EUS) LINEAR;  Surgeon: Milus Banister, MD;  Location: [facility];  Service: Endoscopy;  Laterality: N/A;  . EUS N/A 03/11/2017   Procedure: UPPER ENDOSCOPIC ULTRASOUND (EUS) RADIAL;  Surgeon: Milus Banister, MD;  [facility]: [facility];  Service: Endoscopy;  Laterality: N/A;  . FINGER SURGERY     Lt middle   . GASTRECTOMY N/A 04/22/2017   Procedure: PARTIAL GASTRECTOMY;  Surgeon: Stark Klein, MD;  Location: [place];  Service: General;  Laterality: N/A;  . GASTROJEJUNOSTOMY N/A 04/22/2017   Procedure: JEJUNAL FEEDING TUBE PLACEMENT;  Surgeon: Stark Klein, MD;  Location: [place];  Service: General;  Laterality: N/A;  . HYDROCELE EXCISION  02/12/2011   Procedure: HYDROCELECTOMY ADULT;  Surgeon: Marissa Nestle;  Location: [facility];  Service: Urology;  Laterality: Left;  . LAPAROSCOPY N/A 04/22/2017   Procedure: LAPAROSCOPY DIAGNOSTIC ERAS PATHWAY;  Surgeon: Stark Klein, MD;  Location: [place];  Service: General;  Laterality: N/A;  EPIDURAL  . PORTACATH PLACEMENT N/A 05/28/2017   Procedure: INSERTION PORT-A-CATH ERAS PATHWAY;  Surgeon: Stark Klein, MD;  Location: [place];  Service: General;  Laterality: N/A;   Family History Family History  Problem Relation Age of Onset  . Hypertension Mother   . Heart  attack Father   . Cancer Maternal Aunt        leukemia  . Cancer Maternal Aunt        leukemia  . Hypotension Neg Hx   . Anesthesia problems Neg Hx   . Malignant hyperthermia Neg Hx   . Pseudochol deficiency Neg Hx     Social History Social History  Tobacco Use  . Smoking status: Current Some Day Smoker    Packs/day: 0.50    Years: 45.00    Pack years: 22.50    Types: Cigarettes, Cigars    Last attempt to quit: 04/2017    Years since quitting: 0.1  . Smokeless tobacco: Never Used  Substance Use Topics  . Alcohol use: Yes    Alcohol/week: 0.0 oz    Comment: Brandy daily  . Drug use: No   Allergies Penicillins  Review of Systems Review of Systems  Respiratory: Negative for shortness of breath.   Cardiovascular: Negative for chest pain.  Gastrointestinal: Negative for abdominal pain.  Neurological: Negative for headaches.   All other systems are reviewed and are negative for acute change except as noted in the HPI  Physical Exam Vital Signs  I have reviewed the triage vital signs BP (!) 144/94 (BP Location: Left Arm)   Pulse 72   Temp 98.1 F (36.7 C) (Oral)   Resp 18   SpO2 99%   Physical Exam  Constitutional: He is oriented to person, place, and time. He appears well-developed and well-nourished. No distress.  HENT:  Head: Normocephalic and atraumatic.  Nose: Nose normal.  Eyes: Conjunctivae and EOM are normal. Pupils are equal, round, and reactive to light. Right eye exhibits no discharge. Left eye exhibits no discharge. No scleral icterus.  Neck: Normal range of motion. Neck supple.  Cardiovascular: Normal rate and regular rhythm. Exam reveals no gallop and no friction rub.  No murmur heard. Pulmonary/Chest: Effort normal and breath sounds normal. No stridor. No respiratory distress. He has no rales.  Abdominal: Soft. He exhibits no distension. There is no tenderness.    Musculoskeletal: He exhibits no edema or tenderness.  Neurological: He is alert  and oriented to person, place, and time.  Skin: Skin is warm and dry. No rash noted. He is not diaphoretic. No erythema.  Psychiatric: He has a normal mood and affect.  Vitals reviewed.   ED Results and Treatments Labs (all labs ordered are listed, but only abnormal results are displayed) Labs Reviewed - No data to display                                                                                                                       EKG  EKG Interpretation  Date/Time:    Ventricular Rate:    PR Interval:    QRS Duration:   QT Interval:    QTC Calculation:   R Axis:     Text Interpretation:        Radiology No results found. Pertinent labs & imaging results that were available during my care of the patient were reviewed by me and considered in my medical decision making (see chart for details).  Medications Ordered in ED Medications  diazepam (VALIUM) tablet 2 mg (2 mg Oral Given 06/12/17 1339)  Procedures Procedures  (including critical care time)  Medical Decision Making / ED Course I have reviewed the nursing notes for this encounter and the patient's prior records (if available in EHR or on provided paperwork).    Abdomen benign.  Patient denies any emesis or oral intolerance.  He is complaining mostly about his acid reflux and hiccups which began after chemotherapy.  Patient had labs drawn earlier this morning at lab core.  Do not feel that labs at this time are necessary.  No need for advanced imaging at this time.  Patient's PEG tube was bandaged and secured.  Patient was provided with Valium resulting in complete resolution of his hiccups.  Will DC with short course and have him follow-up with oncology.  The patient appears reasonably screened and/or stabilized for discharge and I doubt any other medical condition or other [facility]  requiring further screening, evaluation, or treatment in the ED at this time prior to discharge.   Final Clinical Impression(s) / ED Diagnoses Final diagnoses:  Gastroesophageal reflux disease, esophagitis presence not specified  Hiccups   Disposition: Discharge  Condition: Good  I have discussed the results, Dx and Tx plan with the patient who expressed understanding and agree(s) with the plan. Discharge instructions discussed at great length. The patient was given strict return precautions who verbalized understanding of the instructions. No further questions at time of discharge.    ED Discharge Orders        Ordered    diazepam (VALIUM) 2 MG tablet  Every 12 hours PRN     06/12/17 1413       Follow Up: Octavio Graves, [address] [place] [address] [phone]     Medical Oncology  Call  As needed      This chart was dictated using voice recognition software.  Despite best efforts to proofread,  errors can occur which can change the documentation meaning.   Fatima Blank, MD 06/12/17 1414

## 2017-06-12 NOTE — ED Triage Notes (Signed)
Patient here from home with complaints of dehydration. PCP sent him for labs this morning. Reports nausea/vomiting, and continuous hiccups. Stomach cancer patient . Feeding tube - reports that he does not use. Denies swallowing difficulty. Second chemo treatment next week.

## 2017-06-14 ENCOUNTER — Other Ambulatory Visit: Payer: Self-pay | Admitting: Emergency Medicine

## 2017-06-14 MED ORDER — CHLORPROMAZINE HCL 25 MG PO TABS: 25.0000 mg | ORAL_TABLET | Freq: Three times a day (TID) | ORAL | 0 refills | Status: DC | PRN

## 2017-06-16 ENCOUNTER — Inpatient Hospital Stay: Payer: Medicare HMO

## 2017-06-16 ENCOUNTER — Telehealth: Payer: Self-pay | Admitting: General Practice

## 2017-06-16 ENCOUNTER — Telehealth: Payer: Self-pay | Admitting: Emergency Medicine

## 2017-06-16 ENCOUNTER — Inpatient Hospital Stay: Payer: Medicare HMO | Attending: Nurse Practitioner

## 2017-06-16 ENCOUNTER — Encounter: Payer: Self-pay | Admitting: General Practice

## 2017-06-16 ENCOUNTER — Encounter: Payer: Self-pay | Admitting: Hematology

## 2017-06-16 ENCOUNTER — Encounter: Payer: Self-pay | Admitting: Emergency Medicine

## 2017-06-16 ENCOUNTER — Inpatient Hospital Stay (HOSPITAL_BASED_OUTPATIENT_CLINIC_OR_DEPARTMENT_OTHER): Payer: Medicare HMO | Admitting: Medical

## 2017-06-16 VITALS — BP 157/78 | HR 72 | Temp 97.5°F | Resp 18 | Ht 70.0 in | Wt 156.9 lb

## 2017-06-16 DIAGNOSIS — M1990 Unspecified osteoarthritis, unspecified site: Secondary | ICD-10-CM | POA: Insufficient documentation

## 2017-06-16 DIAGNOSIS — Z95828 Presence of other vascular implants and grafts: Secondary | ICD-10-CM

## 2017-06-16 DIAGNOSIS — Z5111 Encounter for antineoplastic chemotherapy: Secondary | ICD-10-CM | POA: Insufficient documentation

## 2017-06-16 DIAGNOSIS — D649 Anemia, unspecified: Secondary | ICD-10-CM

## 2017-06-16 DIAGNOSIS — E785 Hyperlipidemia, unspecified: Secondary | ICD-10-CM | POA: Insufficient documentation

## 2017-06-16 DIAGNOSIS — Z79899 Other long term (current) drug therapy: Secondary | ICD-10-CM | POA: Insufficient documentation

## 2017-06-16 DIAGNOSIS — E876 Hypokalemia: Secondary | ICD-10-CM

## 2017-06-16 DIAGNOSIS — F17210 Nicotine dependence, cigarettes, uncomplicated: Secondary | ICD-10-CM | POA: Insufficient documentation

## 2017-06-16 DIAGNOSIS — R011 Cardiac murmur, unspecified: Secondary | ICD-10-CM | POA: Diagnosis not present

## 2017-06-16 DIAGNOSIS — I10 Essential (primary) hypertension: Secondary | ICD-10-CM | POA: Diagnosis not present

## 2017-06-16 DIAGNOSIS — M109 Gout, unspecified: Secondary | ICD-10-CM | POA: Insufficient documentation

## 2017-06-16 DIAGNOSIS — C162 Malignant neoplasm of body of stomach: Secondary | ICD-10-CM | POA: Insufficient documentation

## 2017-06-16 DIAGNOSIS — E786 Lipoprotein deficiency: Secondary | ICD-10-CM

## 2017-06-16 LAB — CBC WITH DIFFERENTIAL (CANCER CENTER ONLY)
BASOS ABS: 0 10*3/uL (ref 0.0–0.1)
BASOS PCT: 1 %
Eosinophils Absolute: 0 10*3/uL (ref 0.0–0.5)
Eosinophils Relative: 1 %
HEMATOCRIT: 34.2 % — AB (ref 38.4–49.9)
HEMOGLOBIN: 10.9 g/dL — AB (ref 13.0–17.1)
LYMPHS PCT: 35 %
Lymphs Abs: 1.7 10*3/uL (ref 0.9–3.3)
MCH: 28.3 pg (ref 27.2–33.4)
MCHC: 31.9 g/dL — ABNORMAL LOW (ref 32.0–36.0)
MCV: 88.7 fL (ref 79.3–98.0)
Monocytes Absolute: 0.5 10*3/uL (ref 0.1–0.9)
Monocytes Relative: 11 %
NEUTROS ABS: 2.6 10*3/uL (ref 1.5–6.5)
NEUTROS PCT: 52 %
Platelet Count: 240 10*3/uL (ref 140–400)
RBC: 3.86 MIL/uL — ABNORMAL LOW (ref 4.20–5.82)
RDW: 14.7 % — ABNORMAL HIGH (ref 11.0–14.6)
WBC Count: 4.9 10*3/uL (ref 4.0–10.3)

## 2017-06-16 LAB — CMP (CANCER CENTER ONLY)
ALBUMIN: 3.1 g/dL — AB (ref 3.5–5.0)
ALK PHOS: 92 U/L (ref 40–150)
ALT: 12 U/L (ref 0–55)
ANION GAP: 9 (ref 3–11)
AST: 10 U/L (ref 5–34)
BUN: 11 mg/dL (ref 7–26)
CALCIUM: 8.8 mg/dL (ref 8.4–10.4)
CO2: 26 mmol/L (ref 22–29)
Chloride: 108 mmol/L (ref 98–109)
Creatinine: 1.01 mg/dL (ref 0.70–1.30)
GFR, Estimated: >60 mL/min (ref >60)
Glucose, Bld: 122 mg/dL (ref 70–140)
POTASSIUM: 2.4 mmol/L — AB (ref 3.5–5.1)
SODIUM: 143 mmol/L (ref 136–145)
Total Bilirubin: 0.5 mg/dL (ref 0.2–1.2)
Total Protein: 5.4 g/dL — ABNORMAL LOW (ref 6.4–8.3)

## 2017-06-16 LAB — IRON AND TIBC
Iron: 37 ug/dL — ABNORMAL LOW (ref 42–163)
SATURATION RATIOS: 15 % — AB (ref 42–163)
TIBC: 236 ug/dL (ref 202–409)
UIBC: 200 ug/dL

## 2017-06-16 LAB — FERRITIN: FERRITIN: 218 ng/mL (ref 22–316)

## 2017-06-16 MED ORDER — PALONOSETRON HCL INJECTION 0.25 MG/5ML
0.2500 mg | Freq: Once | INTRAVENOUS | Status: AC
  Administered 2017-06-16: 0.25 mg via INTRAVENOUS

## 2017-06-16 MED ORDER — SODIUM CHLORIDE 0.9% FLUSH
10.0000 mL | INTRAVENOUS | Status: DC | PRN
  Filled 2017-06-16: qty 10

## 2017-06-16 MED ORDER — POTASSIUM CHLORIDE CRYS ER 20 MEQ PO TBCR
40.0000 meq | EXTENDED_RELEASE_TABLET | Freq: Once | ORAL | Status: AC
  Administered 2017-06-16: 40 meq via ORAL

## 2017-06-16 MED ORDER — HEPARIN SOD (PORK) LOCK FLUSH 100 UNIT/ML IV SOLN
500.0000 [IU] | Freq: Once | INTRAVENOUS | Status: DC | PRN
  Filled 2017-06-16: qty 5

## 2017-06-16 MED ORDER — DEXTROSE 5 % IV SOLN
81.0000 mg/m2 | Freq: Once | INTRAVENOUS | Status: AC
  Administered 2017-06-16: 150 mg via INTRAVENOUS
  Filled 2017-06-16: qty 10

## 2017-06-16 MED ORDER — DEXAMETHASONE SODIUM PHOSPHATE 10 MG/ML IJ SOLN
10.0000 mg | Freq: Once | INTRAMUSCULAR | Status: AC
  Administered 2017-06-16: 10 mg via INTRAVENOUS

## 2017-06-16 MED ORDER — LEUCOVORIN CALCIUM INJECTION 350 MG
400.0000 mg/m2 | Freq: Once | INTRAVENOUS | Status: AC
  Administered 2017-06-16: 744 mg via INTRAVENOUS
  Filled 2017-06-16: qty 37.2

## 2017-06-16 MED ORDER — OXYCODONE HCL 5 MG PO TABS: 5.0000 mg | ORAL_TABLET | Freq: Four times a day (QID) | ORAL | 0 refills | Status: DC | PRN

## 2017-06-16 MED ORDER — PALONOSETRON HCL INJECTION 0.25 MG/5ML
INTRAVENOUS | Status: AC
  Filled 2017-06-16: qty 5

## 2017-06-16 MED ORDER — DEXAMETHASONE SODIUM PHOSPHATE 10 MG/ML IJ SOLN
INTRAMUSCULAR | Status: AC
  Filled 2017-06-16: qty 1

## 2017-06-16 MED ORDER — SODIUM CHLORIDE 0.9% FLUSH
10.0000 mL | INTRAVENOUS | Status: DC | PRN
  Administered 2017-06-16: 10 mL via INTRAVENOUS
  Filled 2017-06-16: qty 10

## 2017-06-16 MED ORDER — DEXTROSE 5 % IV SOLN
Freq: Once | INTRAVENOUS | Status: AC
  Administered 2017-06-16: 11:00:00 via INTRAVENOUS

## 2017-06-16 MED ORDER — SODIUM CHLORIDE 0.9 % IV SOLN: 10.0000 mg | Freq: Once | INTRAVENOUS | Status: DC

## 2017-06-16 MED ORDER — POTASSIUM CHLORIDE CRYS ER 20 MEQ PO TBCR
EXTENDED_RELEASE_TABLET | ORAL | Status: AC
  Filled 2017-06-16: qty 2

## 2017-06-16 MED ORDER — FLUOROURACIL CHEMO INJECTION 2.5 GM/50ML
400.0000 mg/m2 | Freq: Once | INTRAVENOUS | Status: AC
  Administered 2017-06-16: 750 mg via INTRAVENOUS
  Filled 2017-06-16: qty 15

## 2017-06-16 MED ORDER — SODIUM CHLORIDE 0.9 % IV SOLN
2400.0000 mg/m2 | INTRAVENOUS | Status: DC
  Administered 2017-06-16: 4450 mg via INTRAVENOUS
  Filled 2017-06-16: qty 89

## 2017-06-16 NOTE — Progress Notes (Signed)
Received referral from Anne(SW) regarding grant and transportation.   Called patient and left voicemail to introduce myself and discuss financial concerns. Will also provide [facility] application for additional assistance for patients whom live in [place] at next visit and request income.

## 2017-06-16 NOTE — Telephone Encounter (Signed)
Critical lab value of K+ 2.4 Given to Cira Rue, NP.

## 2017-06-16 NOTE — Telephone Encounter (Signed)
[place] CSW Progress Note  CSW unable to reach patient directly, called Gabriel Poole (ex wife and emergency contact) - Gabriel Poole states that she is now able to transport patient through end of March as her surgery has been postponed.  No current concerns about transport.  Asks that Isabella recontact her and patient mid March to reassess needs.  Edwyna Shell, LCSW Clinical Social Worker Phone:  [phone]

## 2017-06-16 NOTE — Progress Notes (Signed)
[place] CSW Progress Note  Patient has a [place] - Nunzio Cobbs [phone]), is working on patient needs in community.  CSW updated on current status of transport need.  Edwyna Shell, LCSW Clinical Social Worker Phone:  [phone]

## 2017-06-16 NOTE — Patient Instructions (Signed)
[facility] Discharge Instructions for Patients Receiving Chemotherapy  Today you received the following chemotherapy agents:  Oxaliplatin, Leucovorin, and 5FU.  To help prevent nausea and vomiting after your treatment, we encourage you to take your nausea medication as directed.   If you develop nausea and vomiting that is not controlled by your nausea medication, call the clinic.   BELOW ARE SYMPTOMS THAT SHOULD BE REPORTED IMMEDIATELY:  *FEVER GREATER THAN 100.5 F  *CHILLS WITH OR WITHOUT FEVER  NAUSEA AND VOMITING THAT IS NOT CONTROLLED WITH YOUR NAUSEA MEDICATION  *UNUSUAL SHORTNESS OF BREATH  *UNUSUAL BRUISING OR BLEEDING  TENDERNESS IN MOUTH AND THROAT WITH OR WITHOUT PRESENCE OF ULCERS  *URINARY PROBLEMS  *BOWEL PROBLEMS  UNUSUAL RASH Items with * indicate a potential emergency and should be followed up as soon as possible.  Feel free to call the clinic should you have any questions or concerns. The clinic phone number is [phone].  Please show the CHEMO ALERT CARD at check-in to the Emergency Department and triage nurse.  Oxaliplatin Injection What is this medicine? OXALIPLATIN (ox AL i PLA tin) is a chemotherapy drug. It targets fast dividing cells, like cancer cells, and causes these cells to die. This medicine is used to treat cancers of the colon and rectum, and many other cancers. This medicine may be used for other purposes; ask your health care provider or pharmacist if you have questions. COMMON BRAND NAME(S): Eloxatin What should I tell my health care provider before I take this medicine? They need to know if you have any of these conditions: -kidney disease -an unusual or allergic reaction to oxaliplatin, other chemotherapy, other medicines, foods, dyes, or preservatives -pregnant or trying to get pregnant -breast-feeding How should I use this medicine? This drug is given as an infusion into a vein. It is administered in a hospital  or clinic by a specially trained health care professional. Talk to your pediatrician regarding the use of this medicine in children. Special care may be needed. Overdosage: If you think you have taken too much of this medicine contact a poison control center or emergency room at once. NOTE: This medicine is only for you. Do not share this medicine with others. What if I miss a dose? It is important not to miss a dose. Call your doctor or health care professional if you are unable to keep an appointment. What may interact with this medicine? -medicines to increase blood counts like filgrastim, pegfilgrastim, sargramostim -probenecid -some antibiotics like amikacin, gentamicin, neomycin, polymyxin B, streptomycin, tobramycin -zalcitabine Talk to your doctor or health care professional before taking any of these medicines: -acetaminophen -aspirin -ibuprofen -ketoprofen -naproxen This list may not describe all possible interactions. Give your health care provider a list of all the medicines, herbs, non-prescription drugs, or dietary supplements you use. Also tell them if you smoke, drink alcohol, or use illegal drugs. Some items may interact with your medicine. What should I watch for while using this medicine? Your condition will be monitored carefully while you are receiving this medicine. You will need important blood work done while you are taking this medicine. This medicine can make you more sensitive to cold. Do not drink cold drinks or use ice. Cover exposed skin before coming in contact with cold temperatures or cold objects. When out in cold weather wear warm clothing and cover your mouth and nose to warm the air that goes into your lungs. Tell your doctor if you get sensitive to the   cold. This drug may make you feel generally unwell. This is not uncommon, as chemotherapy can affect healthy cells as well as cancer cells. Report any side effects. Continue your course of treatment even  though you feel ill unless your doctor tells you to stop. In some cases, you may be given additional medicines to help with side effects. Follow all directions for their use. Call your doctor or health care professional for advice if you get a fever, chills or sore throat, or other symptoms of a cold or flu. Do not treat yourself. This drug decreases your body's ability to fight infections. Try to avoid being around people who are sick. This medicine may increase your risk to bruise or bleed. Call your doctor or health care professional if you notice any unusual bleeding. Be careful brushing and flossing your teeth or using a toothpick because you may get an infection or bleed more easily. If you have any dental work done, tell your dentist you are receiving this medicine. Avoid taking products that contain aspirin, acetaminophen, ibuprofen, naproxen, or ketoprofen unless instructed by your doctor. These medicines may hide a fever. Do not become pregnant while taking this medicine. Women should inform their doctor if they wish to become pregnant or think they might be pregnant. There is a potential for serious side effects to an unborn child. Talk to your health care professional or pharmacist for more information. Do not breast-feed an infant while taking this medicine. Call your doctor or health care professional if you get diarrhea. Do not treat yourself. What side effects may I notice from receiving this medicine? Side effects that you should report to your doctor or health care professional as soon as possible: -allergic reactions like skin rash, itching or hives, swelling of the face, lips, or tongue -low blood counts - This drug may decrease the number of white blood cells, red blood cells and platelets. You may be at increased risk for infections and bleeding. -signs of infection - fever or chills, cough, sore throat, pain or difficulty passing urine -signs of decreased platelets or bleeding -  bruising, pinpoint red spots on the skin, black, tarry stools, nosebleeds -signs of decreased red blood cells - unusually weak or tired, fainting spells, lightheadedness -breathing problems -chest pain, pressure -cough -diarrhea -jaw tightness -mouth sores -nausea and vomiting -pain, swelling, redness or irritation at the injection site -pain, tingling, numbness in the hands or feet -problems with balance, talking, walking -redness, blistering, peeling or loosening of the skin, including inside the mouth -trouble passing urine or change in the amount of urine Side effects that usually do not require medical attention (report to your doctor or health care professional if they continue or are bothersome): -changes in vision -constipation -hair loss -loss of appetite -metallic taste in the mouth or changes in taste -stomach pain This list may not describe all possible side effects. Call your doctor for medical advice about side effects. You may report side effects to FDA at 1-800-FDA-1088. Where should I keep my medicine? This drug is given in a hospital or clinic and will not be stored at home. NOTE: This sheet is a summary. It may not cover all possible information. If you have questions about this medicine, talk to your doctor, pharmacist, or health care provider.  2018 [employer] Standard (2007-11-22 17:22:47)  Leucovorin injection What is this medicine? LEUCOVORIN (loo koe VOR in) is used to prevent or treat the harmful effects of some medicines. This medicine is used to   treat anemia caused by a low amount of folic acid in the body. It is also used with 5-fluorouracil (5-FU) to treat colon cancer. This medicine may be used for other purposes; ask your health care provider or pharmacist if you have questions. What should I tell my health care provider before I take this medicine? They need to know if you have any of these conditions: -anemia from low levels of vitamin B-12 in  the blood -an unusual or allergic reaction to leucovorin, folic acid, other medicines, foods, dyes, or preservatives -pregnant or trying to get pregnant -breast-feeding How should I use this medicine? This medicine is for injection into a muscle or into a vein. It is given by a health care professional in a hospital or clinic setting. Talk to your pediatrician regarding the use of this medicine in children. Special care may be needed. Overdosage: If you think you have taken too much of this medicine contact a poison control center or emergency room at once. NOTE: This medicine is only for you. Do not share this medicine with others. What if I miss a dose? This does not apply. What may interact with this medicine? -capecitabine -fluorouracil -phenobarbital -phenytoin -primidone -trimethoprim-sulfamethoxazole This list may not describe all possible interactions. Give your health care provider a list of all the medicines, herbs, non-prescription drugs, or dietary supplements you use. Also tell them if you smoke, drink alcohol, or use illegal drugs. Some items may interact with your medicine. What should I watch for while using this medicine? Your condition will be monitored carefully while you are receiving this medicine. This medicine may increase the side effects of 5-fluorouracil, 5-FU. Tell your doctor or health care professional if you have diarrhea or mouth sores that do not get better or that get worse. What side effects may I notice from receiving this medicine? Side effects that you should report to your doctor or health care professional as soon as possible: -allergic reactions like skin rash, itching or hives, swelling of the face, lips, or tongue -breathing problems -fever, infection -mouth sores -unusual bleeding or bruising -unusually weak or tired Side effects that usually do not require medical attention (report to your doctor or health care professional if they continue or  are bothersome): -constipation or diarrhea -loss of appetite -nausea, vomiting This list may not describe all possible side effects. Call your doctor for medical advice about side effects. You may report side effects to FDA at 1-800-FDA-1088. Where should I keep my medicine? This drug is given in a hospital or clinic and will not be stored at home. NOTE: This sheet is a summary. It may not cover all possible information. If you have questions about this medicine, talk to your doctor, pharmacist, or health care provider.  2018 [employer]/Gold Standard (2007-11-01 16:50:29)  Fluorouracil, 5-FU injection What is this medicine? FLUOROURACIL, 5-FU (flure oh YOOR a sil) is a chemotherapy drug. It slows the growth of cancer cells. This medicine is used to treat many types of cancer like breast cancer, colon or rectal cancer, pancreatic cancer, and stomach cancer. This medicine may be used for other purposes; ask your health care provider or pharmacist if you have questions. COMMON BRAND NAME(S): Adrucil What should I tell my health care provider before I take this medicine? They need to know if you have any of these conditions: -blood disorders -dihydropyrimidine dehydrogenase (DPD) deficiency -infection (especially a virus infection such as chickenpox, cold sores, or herpes) -kidney disease -liver disease -malnourished, poor nutrition -recent   or ongoing radiation therapy -an unusual or allergic reaction to fluorouracil, other chemotherapy, other medicines, foods, dyes, or preservatives -pregnant or trying to get pregnant -breast-feeding How should I use this medicine? This drug is given as an infusion or injection into a vein. It is administered in a hospital or clinic by a specially trained health care professional. Talk to your pediatrician regarding the use of this medicine in children. Special care may be needed. Overdosage: If you think you have taken too much of this medicine contact a  poison control center or emergency room at once. NOTE: This medicine is only for you. Do not share this medicine with others. What if I miss a dose? It is important not to miss your dose. Call your doctor or health care professional if you are unable to keep an appointment. What may interact with this medicine? -allopurinol -cimetidine -dapsone -digoxin -hydroxyurea -leucovorin -levamisole -medicines for seizures like ethotoin, fosphenytoin, phenytoin -medicines to increase blood counts like filgrastim, pegfilgrastim, sargramostim -medicines that treat or prevent blood clots like warfarin, enoxaparin, and dalteparin -methotrexate -metronidazole -pyrimethamine -some other chemotherapy drugs like busulfan, cisplatin, estramustine, vinblastine -trimethoprim -trimetrexate -vaccines Talk to your doctor or health care professional before taking any of these medicines: -acetaminophen -aspirin -ibuprofen -ketoprofen -naproxen This list may not describe all possible interactions. Give your health care provider a list of all the medicines, herbs, non-prescription drugs, or dietary supplements you use. Also tell them if you smoke, drink alcohol, or use illegal drugs. Some items may interact with your medicine. What should I watch for while using this medicine? Visit your doctor for checks on your progress. This drug may make you feel generally unwell. This is not uncommon, as chemotherapy can affect healthy cells as well as cancer cells. Report any side effects. Continue your course of treatment even though you feel ill unless your doctor tells you to stop. In some cases, you may be given additional medicines to help with side effects. Follow all directions for their use. Call your doctor or health care professional for advice if you get a fever, chills or sore throat, or other symptoms of a cold or flu. Do not treat yourself. This drug decreases your body's ability to fight infections. Try to  avoid being around people who are sick. This medicine may increase your risk to bruise or bleed. Call your doctor or health care professional if you notice any unusual bleeding. Be careful brushing and flossing your teeth or using a toothpick because you may get an infection or bleed more easily. If you have any dental work done, tell your dentist you are receiving this medicine. Avoid taking products that contain aspirin, acetaminophen, ibuprofen, naproxen, or ketoprofen unless instructed by your doctor. These medicines may hide a fever. Do not become pregnant while taking this medicine. Women should inform their doctor if they wish to become pregnant or think they might be pregnant. There is a potential for serious side effects to an unborn child. Talk to your health care professional or pharmacist for more information. Do not breast-feed an infant while taking this medicine. Men should inform their doctor if they wish to father a child. This medicine may lower sperm counts. Do not treat diarrhea with over the counter products. Contact your doctor if you have diarrhea that lasts more than 2 days or if it is severe and watery. This medicine can make you more sensitive to the sun. Keep out of the sun. If you cannot avoid being in the   sun, wear protective clothing and use sunscreen. Do not use sun lamps or tanning beds/booths. What side effects may I notice from receiving this medicine? Side effects that you should report to your doctor or health care professional as soon as possible: -allergic reactions like skin rash, itching or hives, swelling of the face, lips, or tongue -low blood counts - this medicine may decrease the number of white blood cells, red blood cells and platelets. You may be at increased risk for infections and bleeding. -signs of infection - fever or chills, cough, sore throat, pain or difficulty passing urine -signs of decreased platelets or bleeding - bruising, pinpoint red spots  on the skin, black, tarry stools, blood in the urine -signs of decreased red blood cells - unusually weak or tired, fainting spells, lightheadedness -breathing problems -changes in vision -chest pain -mouth sores -nausea and vomiting -pain, swelling, redness at site where injected -pain, tingling, numbness in the hands or feet -redness, swelling, or sores on hands or feet -stomach pain -unusual bleeding Side effects that usually do not require medical attention (report to your doctor or health care professional if they continue or are bothersome): -changes in finger or toe nails -diarrhea -dry or itchy skin -hair loss -headache -loss of appetite -sensitivity of eyes to the light -stomach upset -unusually teary eyes This list may not describe all possible side effects. Call your doctor for medical advice about side effects. You may report side effects to FDA at 1-800-FDA-1088. Where should I keep my medicine? This drug is given in a hospital or clinic and will not be stored at home. NOTE: This sheet is a summary. It may not cover all possible information. If you have questions about this medicine, talk to your doctor, pharmacist, or health care provider.  2018 [employer] Standard (2007-08-31 13:53:16)    

## 2017-06-18 ENCOUNTER — Inpatient Hospital Stay: Payer: Medicare HMO

## 2017-06-18 VITALS — BP 145/78 | HR 70 | Temp 98.1°F | Resp 18

## 2017-06-18 DIAGNOSIS — C162 Malignant neoplasm of body of stomach: Secondary | ICD-10-CM

## 2017-06-18 DIAGNOSIS — F17210 Nicotine dependence, cigarettes, uncomplicated: Secondary | ICD-10-CM | POA: Diagnosis not present

## 2017-06-18 DIAGNOSIS — M1990 Unspecified osteoarthritis, unspecified site: Secondary | ICD-10-CM | POA: Diagnosis not present

## 2017-06-18 DIAGNOSIS — E785 Hyperlipidemia, unspecified: Secondary | ICD-10-CM | POA: Diagnosis not present

## 2017-06-18 DIAGNOSIS — D649 Anemia, unspecified: Secondary | ICD-10-CM | POA: Diagnosis not present

## 2017-06-18 DIAGNOSIS — Z5111 Encounter for antineoplastic chemotherapy: Secondary | ICD-10-CM | POA: Diagnosis not present

## 2017-06-18 DIAGNOSIS — I10 Essential (primary) hypertension: Secondary | ICD-10-CM | POA: Diagnosis not present

## 2017-06-18 DIAGNOSIS — E876 Hypokalemia: Secondary | ICD-10-CM | POA: Diagnosis not present

## 2017-06-18 DIAGNOSIS — Z79899 Other long term (current) drug therapy: Secondary | ICD-10-CM | POA: Diagnosis not present

## 2017-06-18 MED ORDER — SODIUM CHLORIDE 0.9% FLUSH
10.0000 mL | INTRAVENOUS | Status: DC | PRN
  Administered 2017-06-18: 10 mL
  Filled 2017-06-18: qty 10

## 2017-06-18 MED ORDER — HEPARIN SOD (PORK) LOCK FLUSH 100 UNIT/ML IV SOLN
500.0000 [IU] | Freq: Once | INTRAVENOUS | Status: AC | PRN
  Administered 2017-06-18: 500 [IU]
  Filled 2017-06-18: qty 5

## 2017-06-18 NOTE — Progress Notes (Signed)
[facility] Progress Note   Gabriel Poole [id number] 1945-07-14 72 y.o.  Gabriel Poole is managed by Dr. Truitt Merle  Actively treated with chemotherapy: yes  Current Therapy: FOLFOX  Last Treated: 01 / 23 / 2019  Assessment: Plan:    Hypokalemia - Plan: potassium chloride SA (K-DUR,KLOR-CON) CR tablet 40 mEq  Malignant neoplasm of body of stomach ([place]) - Plan: oxyCODONE (OXY IR/ROXICODONE) 5 MG immediate release tablet   Hypokalemia.  Labs returned today showing a potassium of 2.4.  Gabriel Poole was given potassium chloride tablet 40 mEq p.o. X1.  He has potassium at home but has not been taking it recently.  He agrees to restart his oral potassium.  His labs will be rechecked on his return.  Malignant neoplasm of the body of the stomach: Gabriel Poole request a refill of his oxycodone.  He was provided with a prescription today.  His labs were reviewed.  His WBC returned at 4.9 with an [place] of 2.6.  His hemoglobin was at 10.9 and hematocrit was at 34.2.  His platelet count was stable at 240.  Based on the patient's stable labs and his good tolerance of cycle 1 of FOLFOX we will proceed with cycle 2 of FOLFOX today.  The patient is scheduled to return to see Dr. Burr Medico on 06/30/2017.  Please see After Visit Summary for patient specific instructions.  Future Appointments  Date Time Provider [place]  06/18/2017 12:30 PM [facility] FLUSH NURSE [facility] None  06/25/2017  1:00 PM Forrest, [name], LCSW [facility] None  06/29/2017  1:30 PM Kassie Mends, RN [facility] None  06/30/2017  9:00 AM [facility] LAB 4 [facility] None  06/30/2017  9:15 AM [facility] I27 DNS [facility] None  06/30/2017  9:45 AM Truitt Merle, MD [facility] None  06/30/2017 10:45 AM [facility] D13 [facility] None  06/30/2017 12:00 PM Karie Mainland, RD [facility] None  07/02/2017  1:00 PM [facility] FLUSH NURSE 2 [facility] None    No orders of the defined types were placed in this  encounter.      Subjective:   Patient ID:  Gabriel Poole is a 72 y.o. (DOB [birth year]) male.  Chief Complaint: No chief complaint on file.   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  He was originally diagnosed on a biopsy dating to 02/17/2017.  Review of the patient's chart shows that an endoscopy completed on 03/11/2017 showed an ulcerated malignant mass along the greater curvature of the stomach approximately in mid body.  A CT scan of the chest completed on 04/06/2017 showed 2 small pulmonary nodules and a nonspecific hyperdense, possible enhancing lesion along the dome of the liver.  An MRI of the abdomen completed on 04/21/2017 showed an enhancing 2.9 cm mass along the lesser curvature in the gastric antrum, mildly enlarged gastrohepatic ligament lymph nodes and no definitive liver metastasis.  A hyperenhancing 0.9 cm liver dome mass was noted but was inconclusive.  The features were most suggestive of a flash filling hemangioma.  The mass had been stable for approximately 2 months.  A partial gastrectomy was completed on 04/22/2017 with the resected stomach tumor showing an invasive adenocarcinoma which is poorly differentiated.  Perineural invasion was noted.  Adenocarcinoma was noted to involve the serosa.  Metastatic carcinoma was noted in 12 of 30 lymph nodes with extracapsular extension.  The liver was biopsied.  It showed no evidence of  malignancy.  2 of 6 regional lymph nodes were noted to have metastatic carcinoma involvement.  Gabriel Poole was seen by Dr. Burr Medico and was initiated on cycle 1 of FOLFOX on 06/02/2017.  He presents to the clinic today for consideration of cycle 2 of FOLFOX.  He continues to have a PEG tube which he is not currently using.  He is hopeful to have this removed later this week.  He continues to have some pain in that area.  He reports that he is eating well.  He has been having some diarrhea.   He has been on oral potassium supplements but has not been taking them recently.  His labs from today showed his potassium is at 2.4.  He is agreeable to restart his potassium and to take oral potassium (40 mEq) while he is here today.  Medications: I have reviewed the patient's current medications.  Allergies:  Allergies  Allergen Reactions  . Penicillins Itching and Other (See Comments)     patient had a PCN reaction causing immediate rash, facial/tongue/throat swelling, SOB or lightheadedness with hypotension: Unknown Has patient had a PCN reaction causing severe rash involving mucus membranes or skin necrosis: Unknown Has patient had a PCN reaction that required hospitalization: Unknown Has patient had a PCN reaction occurring within the last 10 years: No If all of the above answers are "NO", then may proceed with Cephalosporin use.     Past Medical History:  Diagnosis Date  . Arthritis   . Cancer [facility]) dx oct 02-2017   stomach  . Gout   . Heart murmur   . Hyperlipidemia   . Hypertension   . Stomach cancer [facility])     Past Surgical History:  Procedure Laterality Date  . COLONOSCOPY    . ESOPHAGOGASTRODUODENOSCOPY N/A 02/17/2017   Procedure: ESOPHAGOGASTRODUODENOSCOPY (EGD);  Surgeon: Rogene Houston, MD;  [facility];  Service: Endoscopy;  Laterality: N/A;  3:00  . EUS N/A 03/11/2017   Procedure: UPPER ENDOSCOPIC ULTRASOUND (EUS) LINEAR;  Surgeon: Milus Banister, MD;  Location: [facility];  Service: Endoscopy;  Laterality: N/A;  . EUS N/A 03/11/2017   Procedure: UPPER ENDOSCOPIC ULTRASOUND (EUS) RADIAL;  Surgeon: Milus Banister, MD;  Location[facility];  Service: Endoscopy;  Laterality: N/A;  . FINGER SURGERY     Lt middle   . GASTRECTOMY N/A 04/22/2017   Procedure: PARTIAL GASTRECTOMY;  Surgeon: Stark Klein, MD;  Location: [place];  Service: General;  Laterality: N/A;  . GASTROJEJUNOSTOMY N/A 04/22/2017   Procedure: JEJUNAL FEEDING TUBE PLACEMENT;   Surgeon: Stark Klein, MD;  Location: [place];  Service: General;  Laterality: N/A;  . HYDROCELE EXCISION  02/12/2011   Procedure: HYDROCELECTOMY ADULT;  Surgeon: Marissa Nestle;  Location: [facility];  Service: Urology;  Laterality: Left;  . LAPAROSCOPY N/A 04/22/2017   Procedure: LAPAROSCOPY DIAGNOSTIC ERAS PATHWAY;  Surgeon: Stark Klein, MD;  Location: [place];  Service: General;  Laterality: N/A;  EPIDURAL  . PORTACATH PLACEMENT N/A 05/28/2017   Procedure: INSERTION PORT-A-CATH ERAS PATHWAY;  Surgeon: Stark Klein, MD;  Location: [place];  Service: General;  Laterality: N/A;    Family History  Problem Relation Age of Onset  . Hypertension Mother   . Heart attack Father   . Cancer Maternal Aunt        leukemia  . Cancer Maternal Aunt        leukemia  . Hypotension Neg Hx   . Anesthesia problems Neg Hx   .  Malignant hyperthermia Neg Hx   . Pseudochol deficiency Neg Hx     Social History   Socioeconomic History  . Marital status: Divorced    Spouse name: Not on file  . Number of children: Not on file  . Years of education: Not on file  . Highest education level: Not on file  Social Needs  . Financial resource strain: Not on file  . Food insecurity - worry: Not on file  . Food insecurity - inability: Not on file  . Transportation needs - medical: Not on file  . Transportation needs - non-medical: Not on file  Occupational History  . Occupation: works at [employer]  . Smoking status: Current Some Day Smoker    Packs/day: 0.50    Years: 45.00    Pack years: 22.50    Types: Cigarettes, Cigars    Last attempt to quit: 04/2017    Years since quitting: 0.1  . Smokeless tobacco: Never Used  Substance and Sexual Activity  . Alcohol use: Yes    Alcohol/week: 0.0 oz    Comment: Brandy daily  . Drug use: No  . Sexual activity: Yes  Other Topics Concern  . Not on file  Social History Narrative  . Not on file    Past Medical History, Surgical  history, Social history, and Family history were reviewed and updated as appropriate.   Please see review of systems for further details on the patient's review from today.   Review of Systems:  Review of Systems  Constitutional: Negative for activity change, appetite change, chills, diaphoresis and fever.  HENT: Negative for trouble swallowing.   Respiratory: Negative for cough, choking, chest tightness, shortness of breath and wheezing.   Cardiovascular: Negative for chest pain and palpitations.  Gastrointestinal: Positive for abdominal pain (Mild pain PEG tube insertion site.) and diarrhea. Negative for abdominal distention, constipation, nausea and vomiting.  Genitourinary: Negative for difficulty urinating.  Neurological: Negative for headaches.    Objective:   Physical Exam:  BP (!) 157/78 (BP Location: Right Arm, Patient Position: Sitting)   Pulse 72   Temp (!) 97.5 F (36.4 C) (Oral)   Resp 18   Ht 5\' 10"  (1.778 m)   Wt 156 lb 14.4 oz (71.2 kg)   SpO2 100% Comment: RA  BMI 22.51 kg/m  ECOG: 0  Physical Exam  Constitutional: No distress.  HENT:  Head: Normocephalic and atraumatic.  Mouth/Throat: Oropharynx is clear and moist. No oropharyngeal exudate.  The patient has multiple teeth missing.  The remaining teeth are in disrepair.  Neck: Normal range of motion. Neck supple.  Cardiovascular: Normal rate, regular rhythm and normal heart sounds. Exam reveals no gallop and no friction rub.  No murmur heard. Pulmonary/Chest: Effort normal and breath sounds normal. No respiratory distress. He has no wheezes. He has no rales.  Abdominal: Soft. Bowel sounds are normal. He exhibits no distension and no mass. There is no tenderness. There is no rebound and no guarding.    Musculoskeletal: He exhibits no edema.  Lymphadenopathy:    He has no cervical adenopathy.  Neurological: He is alert. Coordination normal.  Skin: Skin is warm and dry. He is not diaphoretic.    Psychiatric: He has a normal mood and affect. His behavior is normal. Judgment and thought content normal.    Lab Review:     Component Value Date/Time   NA 143 06/16/2017 0829   NA 145 (H) 05/31/2015 1222   K 2.4 (LL) 06/16/2017  0829   CL 108 06/16/2017 0829   CO2 26 06/16/2017 0829   GLUCOSE 122 06/16/2017 0829   BUN 11 06/16/2017 0829   BUN 11 05/31/2015 1222   CREATININE 1.01 06/16/2017 0829   CREATININE 1.18 11/15/2012 0934   CALCIUM 8.8 06/16/2017 0829   PROT 5.4 (L) 06/16/2017 0829   PROT 7.0 05/31/2015 1222   ALBUMIN 3.1 (L) 06/16/2017 0829   ALBUMIN 4.6 05/31/2015 1222   AST 10 06/16/2017 0829   ALT 12 06/16/2017 0829   ALKPHOS 92 06/16/2017 0829   BILITOT 0.5 06/16/2017 0829   GFRNONAA >60 06/16/2017 0829   GFRNONAA 64 11/15/2012 0934   GFRAA >60 06/16/2017 0829   GFRAA 74 11/15/2012 0934       Component Value Date/Time   WBC 4.9 06/16/2017 0829   WBC 13.0 (H) 04/27/2017 0401   RBC 3.86 (L) 06/16/2017 0829   HGB 11.2 (L) 04/27/2017 0401   HGB 14.1 05/31/2015 1222   HCT 34.2 (L) 06/16/2017 0829   HCT 43.7 05/31/2015 1222   PLT 240 06/16/2017 0829   PLT 297 05/31/2015 1222   MCV 88.7 06/16/2017 0829   MCV 87 05/31/2015 1222   MCH 28.3 06/16/2017 0829   MCHC 31.9 (L) 06/16/2017 0829   RDW 14.7 (H) 06/16/2017 0829   RDW 14.4 05/31/2015 1222   LYMPHSABS 1.7 06/16/2017 0829   LYMPHSABS 2.7 05/31/2015 1222   MONOABS 0.5 06/16/2017 0829   EOSABS 0.0 06/16/2017 0829   EOSABS 0.2 05/31/2015 1222   BASOSABS 0.0 06/16/2017 0829   BASOSABS 0.0 05/31/2015 1222   -------------------------------  Imaging from last 24 hours (if applicable):  Radiology interpretation: Dg Chest Port 1 View  Result Date: 05/28/2017 CLINICAL DATA:  Port-A-Cath insertion. EXAM: PORTABLE CHEST 1 VIEW COMPARISON:  Chest x-ray dated 12/17/2010 FINDINGS: New Port-A-Cath has been inserted. Tip is at the cavoatrial junction. No pneumothorax. Heart size and vascularity are normal. Lungs  are clear. No acute bone abnormality. Old deformity of the left clavicle. Slight arthritic changes at the shoulders. IMPRESSION: 1. Port-A-Cath appears in good position. 2. No pneumothorax. 3. Clear lungs. Electronically Signed   By: Lorriane Shire M.D.   On: 05/28/2017 09:45   Dg Fluoro Guide Cv Line-no Report  Result Date: 05/28/2017 Fluoroscopy was utilized by the requesting physician.  No radiographic interpretation.

## 2017-06-23 DIAGNOSIS — I10 Essential (primary) hypertension: Secondary | ICD-10-CM | POA: Diagnosis not present

## 2017-06-23 DIAGNOSIS — Z434 Encounter for attention to other artificial openings of digestive tract: Secondary | ICD-10-CM | POA: Diagnosis not present

## 2017-06-23 DIAGNOSIS — E46 Unspecified protein-calorie malnutrition: Secondary | ICD-10-CM | POA: Diagnosis not present

## 2017-06-23 DIAGNOSIS — Z903 Acquired absence of stomach [part of]: Secondary | ICD-10-CM | POA: Diagnosis not present

## 2017-06-23 DIAGNOSIS — E785 Hyperlipidemia, unspecified: Secondary | ICD-10-CM | POA: Diagnosis not present

## 2017-06-23 DIAGNOSIS — C162 Malignant neoplasm of body of stomach: Secondary | ICD-10-CM | POA: Diagnosis not present

## 2017-06-23 DIAGNOSIS — Z483 Aftercare following surgery for neoplasm: Secondary | ICD-10-CM | POA: Diagnosis not present

## 2017-06-23 DIAGNOSIS — F17200 Nicotine dependence, unspecified, uncomplicated: Secondary | ICD-10-CM | POA: Diagnosis not present

## 2017-06-23 DIAGNOSIS — C163 Malignant neoplasm of pyloric antrum: Secondary | ICD-10-CM | POA: Diagnosis not present

## 2017-06-25 ENCOUNTER — Other Ambulatory Visit: Payer: Self-pay | Admitting: Licensed Clinical Social Worker

## 2017-06-25 NOTE — Patient Outreach (Signed)
Assessment:  CSW spoke via phone with client. CSW verified client identity. CSW received verbal permission from client on 06/25/17 for CSW to speak with client or with Levie Heritage about client needs and status.Client sees Gabriel Poole as primary care providrer.  Client resides at home with his girlfriend.  Gabriel Poole is ex-wife of client. Gabriel Poole is listed as emergency contact for client.  Client  Is sometimes difficult to contact via phone. He has asked that if Gabriel Poole staff cannot reach him via phone that Gabriel Poole staff contact Gabriel Poole to relay information to client. Gabriel Poole has been transporting client to and from client's scheduled medical appointments.  Client spoke of his recent cancer diagnosis. Client said that Gabriel Poole has been transporting client to and from client's scheduled cancer treatments.  CSW has spoken with client  about [place] [facility] program support related to transport assistance through volunteer group.  Client said he sometimes has reduced energy.  Client is also seeking transport assistance to cancer treatments through his local church. CSW spoke with client about client care plan. CSW encouraged that client communicate with CSW in next 30 days to discuss transport resources for client in the area.  Client is receiving [facility] nusing support with RN Gabriel Poole.  Client receives two cancer treatments every two weeks as scheduled.  Gabriel Poole is scheduled to have surgery; and her surgery is scheduled for August 09, 2017.  She is scheduled for knee surgery.   Client has received information about [facility] in [place], [place]. Gabriel Poole is supportive of client. She plans to keep transporting client to and from client's cancer treatments through the end of March 2019.  Client is eating well and client's weight is stable. Client is not working at present. CSW has provided client and Gabriel Poole with Gabriel Poole phone  number of [phone]. CSW has encouraged that client or Gabriel Poole call CSW as needed to discuss social work needs of client.    Plan:  Client to communicate with CSW in next 30 days to discuss transport resources for client in the area.    CSW to call client or Gabriel Poole in 4 weeks to assess client needs at that time.  Gabriel Poole.Gabriel Poole MSW, LCSW Licensed Clinical Social Worker [facility] [phone]

## 2017-06-28 NOTE — Progress Notes (Signed)
[place]  [phone] [phone]) [phone]  Clinic Follow up Note   Patient Care Team: Octavio Graves, DO as PCP - General Truitt Merle, MD as Consulting Physician (Hematology) Alla Feeling, NP as Nurse Practitioner (Nurse Practitioner) Stark Klein, MD as Consulting Physician (General Surgery) Rogene Houston, MD as Consulting Physician (Gastroenterology) Milus Banister, MD as Attending Physician (Gastroenterology) Shea Evans [name], LCSW as [place] [employer] (Licensed Clinical Social Worker) 06/30/2017   CHIEF COMPLAINTS:  Primary adenocarcinoma of the pyloric antrum   SUMMARY OF ONCOLOGIC HISTORY: Oncology History   Cancer Staging Malignant neoplasm of body of stomach ([place]) Staging form: Stomach, AJCC 8th Edition - Clinical stage from 03/11/2017: Stage IIB (cT3, cN0, cM0) - Unsigned - Pathologic stage from 04/22/2017: Stage IIIB (pT4a, pN3a, cM0) - Signed by Alla Feeling, NP on 05/17/2017       Malignant neoplasm of body of stomach ([place])   02/17/2017 Initial Diagnosis    Malignant neoplasm of body of stomach ([place])      02/17/2017 Pathology Results    Diagnosis Stomach, biopsy, gastric ulcer - ADENOCARCINOMA. Microscopic Comment Several of the fragments are involved by moderately differentiated adenocarcinoma.      02/17/2017 Procedure    UPPER ENDOSCOPY  FINDINGS - Normal esophagus. - Z-line irregular, 44 cm from the incisors. - Red blood in the gastric body and in the gastric antrum. - Large gastric ulcer. Biopsied. - Erythematous mucosa in the antrum. - Normal cardia, gastric fundus, gastric body and pylorus. - Normal duodenal bulb and second portion of the duodenum. Comment: Endoscopic appearence concerning for malignant ulcer.      03/11/2017 Procedure    UPPER EUS PER DR. Ardis Hughs Findings: 1. The esophagus was normal. 2. There was a 2-3cm ulcerated, malignant mass along the greater curvature of  the stomach, approximately mid-body. The mass was non-circumferential. 3. The duodenum was normal. Endosonographic Finding 1. The gastric mass above correlated with a hypoechoic and heterogenous non-circumferential mass that measured 2.9cm across, 99mm deep. The endosonographic borders were poorly defined and there was clear sonographic evidence suggesting invasion into and through the muscularis propria layer without evident invasion into nearby organs (uT3). 2. The duodenal lymphnode described on recent CT scan appeared reactive by [place] criteria. 3. No perigastric adenopathy (uN0) 4. Limited views of the liver, spleen, pancreas, bile duct, gallbladder were all normal. - Along the greater curvature of the stomach, approximately mid-body, there is a 2.9cm uT3N0 (clinical stage IIB) gastric adenocarcinoma.      04/06/2017 Imaging    CT CHEST IMPRESSION: 1. No acute cardiopulmonary abnormalities. 2. Two small pulmonary nodules are noted measuring up to 5 mm. Nonspecific but warrant attention on follow-up imaging follow up 3. Nonspecific hyperdense, possibly enhancing lesion along the dome of liver is identified. In a patient that is at increased risk for more definitive assessment of this structure with contrast enhanced MRI of the liver is advised.      04/21/2017 Imaging    MR ABDOMEN IMPRESSION: 1. Enhancing 2.9 cm mass along the lesser curvature in the gastric antrum, compatible with known gastric malignancy . 2. Mildly enlarged gastrohepatic ligament lymph node, cannot exclude nodal metastasis. 3. No definite liver metastatic disease. Hyperenhancing 0.9 cm liver dome mass remains inconclusive, although the MRI features are most suggestive of a flash filling hemangioma, and the mass has been stable for nearly 2 months. Additional subcentimeter focus of hyperenhancement in the lateral segment left liver lobe is most likely  a benign transient vascular phenomenon. Recommend  attention to these lesions on a follow-up MRI abdomen without and with IV contrast in 3-6 months. This recommendation follows ACR consensus guidelines: Management of Incidental Liver Lesions on CT: A White Paper of the ACR Incidental Findings Committee. J Am Coll Radiol 2017; 34:1962-2297. 4. Benign right adrenal adenoma.        04/22/2017 Pathology Results    Diagnosis 1. Liver, biopsy - BILE DUCT HAMARTOMA. - THERE IS NO EVIDENCE OF MALIGNANCY. 2. Lymph nodes, regional resection, portal - METASTATIC CARCINOMA IN 2 OF 6 LYMPH NODES (2/6). 3. Stomach, resection for tumor, distal - INVASIVE ADENOCARCINOMA, POORLY DIFFERENTIATED, SPANNING 3.8 CM. - PERINEURAL INVASION IS IDENTIFIED. - ADENOCARCINOMA INVOLVES THE SEROSA. - METASTATIC CARCINOMA IN 12 OF 30 LYMPH NODES (12/30), WITH EXTRACAPSULAR EXTENSION. - SEE ONCOLOGY TABLE BELOW. 4. Lymph node, biopsy, common hepatic artery - THERE IS NO EVIDENCE OF CARCINOMA IN 1 OF 1 LYMPH NODE (0/1). Microscopic Comment 3. STOMACH: Specimen: Stomach. Procedure: Partial gastrectomy. Tumor Site: Greater curvature. Tumor Size: 3.8 cm Histologic Type: Adenocarcinoma. Histologic Grade: G3: poorly differentiated. Microscopic Extent of Tumor: Adenocarcinoma involves the serosa. Margins (select all that apply): Adenoca Proximal Margin: Negative for adenocarcinoma. Distal Margin: Negative for adenocarcinoma. Treatment Effect: N/A Lymph-Vascular Invasion: Not identified. Perineural Invasion: Present. Additional findings: Chronic gastritis. Ancillary testing: Can be performed upon clinician request. 1 of 3 Duplicate copy FINAL for DAI, MCADAMS ([id number]) Microscopic Comment(continued) Lymph nodes: number examined - 37; number positive: 14 Pathologic Staging: pT4a, pN3a (JBK:gt, 04/27/17)      06/02/2017 -  Adjuvant Chemotherapy    FOLFOX every 2 weeks       HISTORY OF PRESENTING ILLNESS:  Gabriel Poole 72 y.o. male is  here because of newly diagnosed gastric cancer. Initially, he began having symptoms of bilateral abdominal pain, bloating, and decreased appetite in 07/2016 that persisted for 1.5 months before seeking care with PCP. He reports 40 pounds weight loss over 4-5 months. Denies nausea, vomiting, constipation, diarrhea, GI bleeding, or early satiety. Then referred to gastroenterologist in [place]. Work up for gall stones was negative. He had good performance status and working at [employer]. EGD by Dr. Laural Golden noted ulcerated mass seen on the greater curvature. CT [facility] showed known malignancy in the antral region of the stomach and a single lymph node adjacent to the duodenal bulb. He was referred to Dr. Ardis Hughs for EUS which confirmed gastric mass spanning 2.9 cm across and 7 mm deep suggesting invasion into and through the muscularis propria without evidence of invasion into nearby organs (uT3); lymph node on CT appeared to be reactive (uN0).     He was referred to Dr. Barry Dienes for surgery consult. CT chest completed staging work up which indicated two non-specific pulmonary nodules and a non-specific, hyperdense, possibly enhancing lesion along the dome of the liver. F/u MR abdomen revealed no definitive liver metastasis, the hyperenhancing lesion is thought to be flash filling hemangioma. He then underwent laparoscopic diagnostic ERAS pathway with partial gastrectomy and jejunal feeding tube placement on 04/25/17. Surgical pathology revealed poorly differentiated adenocarcinoma of the stomach, spanning 3.8cm with perineural invasion and with involvement of the serosa. 12/30 lymph nodes were positive for metastatic carcinoma with extracapsular extension. 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 One lymph node was also surveyed along the common hepatic artery which was negative for carcinoma. He was then referred  to oncology for further  management.   In the past he was diagnosed with arthritis, heart murmur, HTN, hyperlipidemia, gout, hydrocele, and elevated PSA. Previous colonoscopy in 2015 was positive for sessile polyp but otherwise unremarkable and he is on 5-year follow up plan. He drinks one alcoholic drink daily. He smoked 0.5 PPD for 45 years, quit smoking before surgery 04/2017. Family history is positive for 2 maternal aunts diagnosed with leukemia. He is divorced and re-married, has 3 children who are healthy. He is accompanied by his ex-wife today.  Since his surgery he has recovered well. He uses J-tube occasionally but feeds make him nauseous; gets most nutrition and hydration by mouth. He last flushed tube 2 days ago, he has 8/10 left abdominal pain and is asking for refill of pain medication today, often increases to 8/10 at night. BMs are regular, no n/v. He is able to care for himself and complete ADLs independently. He does not drive, but has family who can transport him for appointments, he lives in [place], approx 30 minutes from [place].   CURRENT THERAPY: FOLFOX q2 weeks started on 06/02/17  INTERVAL HISTORY: GLENNON KOPKO is here for follow up and cycle 3 of FOLFOX. He presents today in the infusion room by himself. He reports epistaxis yesterday that lasted for a few minutes. This has never happened before. He also notes that he feels cold. He reports that he has been doing well with chemo so far.He reports he has regular bowel movements. He states he has a nurse that visits him frequently through [employer]. He states he is taking 2-3 oxycodone a day for abdominal pain.   On review of systems, pt denies numbness/tingling in hands or feet, or any other complaints at this time. Pertinent positives are listed and detailed within the above HPI.   REVIEW OF SYSTEMS:   Constitutional: Denies fevers, chills or abnormal weight loss (+) feels cold  Eyes: Denies blurriness of  vision Ears, nose, mouth, throat, and face: Denies mucositis or sore throat Respiratory: Denies cough, dyspnea or wheezes Cardiovascular: Denies palpitation, chest discomfort or lower extremity swelling Gastrointestinal:  Denies nausea, vomiting, constipation, diarrhea, heartburn or change in bowel habits (+) feeding tube, clamped GU: (+) urinary retention after PAC placement  Skin: Denies abnormal skin rashes Lymphatics: Denies new lymphadenopathy or easy bruising Neurological:Denies numbness, tingling or new weaknesses Behavioral/Psych: Mood is stable, no new changes  All other systems were reviewed with the patient and are negative.   MEDICAL HISTORY:  Past Medical History:  Diagnosis Date  . Arthritis   . Cancer [facility]) dx oct 02-2017   stomach  . Gout   . Heart murmur   . Hyperlipidemia   . Hypertension   . Stomach cancer [facility])     SURGICAL HISTORY: Past Surgical History:  Procedure Laterality Date  . COLONOSCOPY    . ESOPHAGOGASTRODUODENOSCOPY N/A 02/17/2017   Procedure: ESOPHAGOGASTRODUODENOSCOPY (EGD);  Surgeon: Rogene Houston, MD;  [facility];  Service: Endoscopy;  Laterality: N/A;  3:00  . EUS N/A 03/11/2017   Procedure: UPPER ENDOSCOPIC ULTRASOUND (EUS) LINEAR;  Surgeon: Milus Banister, MD;  Location: [facility] ENDOSCOPY;  Service: Endoscopy;  Laterality: N/A;  . EUS N/A 03/11/2017   Procedure: UPPER ENDOSCOPIC ULTRASOUND (EUS) RADIAL;  Surgeon: Milus Banister, MD;  Location: [facility] ENDOSCOPY;  Service: Endoscopy;  Laterality: N/A;  . FINGER SURGERY     Lt middle   . GASTRECTOMY N/A 04/22/2017   Procedure: PARTIAL GASTRECTOMY;  Surgeon: Stark Klein, MD;  Location: [facility] OR;  Service: General;  Laterality: N/A;  . GASTROJEJUNOSTOMY N/A 04/22/2017   Procedure: JEJUNAL FEEDING TUBE PLACEMENT;  Surgeon: Stark Klein, MD;  Location: [facility];  Service: General;  Laterality: N/A;  . HYDROCELE EXCISION  02/12/2011   Procedure: HYDROCELECTOMY ADULT;  Surgeon: Marissa Nestle;  Location: [facility] ORS;  Service: Urology;  Laterality: Left;  . LAPAROSCOPY N/A 04/22/2017   Procedure: LAPAROSCOPY DIAGNOSTIC ERAS PATHWAY;  Surgeon: Stark Klein, MD;  Location: [facility];  Service: General;  Laterality: N/A;  EPIDURAL  . PORTACATH PLACEMENT N/A 05/28/2017   Procedure: INSERTION PORT-A-CATH ERAS PATHWAY;  Surgeon: Stark Klein, MD;  Location: [facility];  Service: General;  Laterality: N/A;    I have reviewed the social history and family history with the patient and they are unchanged from previous note.  ALLERGIES:  is allergic to penicillins.  MEDICATIONS:  Current Outpatient Medications  Medication Sig Dispense Refill  . amLODipine (NORVASC) 10 MG tablet Take 1 tablet (10 mg total) by mouth daily. 90 tablet 0  . aspirin EC 81 MG tablet Take 81 mg by mouth daily.    Marland Kitchen atorvastatin (LIPITOR) 40 MG tablet Take 1 tablet (40 mg total) by mouth daily at 6 PM. 90 tablet 0  . chlorproMAZINE (THORAZINE) 25 MG tablet Take 1 tablet (25 mg total) by mouth 3 (three) times daily as needed for hiccoughs. 20 tablet 0  . Cholecalciferol (VITAMIN D3) 5000 units CAPS Take 5,000 Units by mouth daily.    Marland Kitchen dexamethasone (DECADRON) 4 MG tablet Take 2 tablets (8 mg total) by mouth daily. Start the day after chemotherapy for 2 days. Take with food. 30 tablet 1  . lidocaine-prilocaine (EMLA) cream Apply to affected area once 30 g 3  . metoprolol tartrate (LOPRESSOR) 50 MG tablet Take 50 mg by mouth 2 (two) times daily.    Marland Kitchen omeprazole (PRILOSEC) 20 MG capsule Take 20 mg by mouth daily.    . ondansetron (ZOFRAN) 8 MG tablet Take 1 tablet (8 mg total) by mouth 2 (two) times daily as needed for refractory nausea / vomiting. Start on day 3 after chemotherapy. (Patient not taking: Reported on 06/29/2017) 30 tablet 1  . oxyCODONE (OXY IR/ROXICODONE) 5 MG immediate release tablet Take 1 tablet (5 mg total) by mouth every 12 (twelve) hours as needed for moderate pain, severe pain or  breakthrough pain. 40 tablet 0  . pantoprazole (PROTONIX) 40 MG tablet Take 1 tablet (40 mg total) by mouth 2 (two) times daily before a meal. 60 tablet 4  . prochlorperazine (COMPAZINE) 10 MG tablet Take 1 tablet (10 mg total) by mouth every 6 (six) hours as needed (Nausea or vomiting). 30 tablet 1  . ranitidine (ZANTAC) 300 MG tablet Take 300 mg by mouth at bedtime.    . sucralfate (CARAFATE) 1 g tablet Take 1 g by mouth 2 (two) times daily.    Marland Kitchen triamterene-hydrochlorothiazide (MAXZIDE-25) 37.5-25 MG tablet Take 1 tablet by mouth daily.     No current facility-administered medications for this visit.    Facility-Administered Medications Ordered in Other Visits  Medication Dose Route Frequency Provider Last Rate Last Dose  . fluorouracil (ADRUCIL) 4,450 mg in sodium chloride 0.9 % 61 mL chemo infusion  2,400 mg/m2 (Treatment Plan Recorded) Intravenous 1 day or 1 dose Truitt Merle, MD   4,450 mg at 06/30/17 1515    PHYSICAL EXAMINATION: ECOG PERFORMANCE STATUS: 1 - Symptomatic but completely ambulatory Blood pressure 130/77, heart rate is  61, respiratory rate 18, temperature 36.9, pulse ox 100% on room air GENERAL:alert, no distress and comfortable SKIN: skin color, texture, turgor are normal, no rashes or significant lesions EYES: normal, Conjunctiva are pink and non-injected, sclera clear OROPHARYNX:no exudate, no erythema and lips, buccal mucosa, and tongue normal  NECK: supple, thyroid normal size, non-tender, without nodularity LYMPH:  no palpable cervical, supraclavicular, axillary, or inguinal lymphadenopathy LUNGS: clear to auscultation bilaterally with normal breathing effort HEART: regular rate & rhythm and no murmurs and no lower extremity edema ABDOMEN:abdomen soft, non-tender and normal bowel sounds. (+) vertical midline surgical incision is well healed (+) J tube to abdomen, clamped, site covered with gauze dressing c/d/i Musculoskeletal:no cyanosis of digits and no clubbing   NEURO: alert & oriented x 3 with fluent speech, no focal motor/sensory deficits PAC without erythema     LABORATORY DATA:  I have reviewed the data as listed CBC Latest Ref Rng & Units 06/30/2017 06/16/2017 06/02/2017  WBC 4.0 - 10.3 K/uL 3.2(L) 4.9 8.5  Hemoglobin 13.0 - 17.0 g/dL - - -  Hematocrit 38.4 - 49.9 % 35.6(L) 34.2(L) 37.6(L)  Platelets 140 - 400 K/uL 185 240 292     CMP Latest Ref Rng & Units 06/30/2017 06/16/2017 06/02/2017  Glucose 70 - 140 mg/dL 86 122 88  BUN 7 - 26 mg/dL 11 11 20   Creatinine 0.70 - 1.30 mg/dL 1.16 1.01 1.17  Sodium 136 - 145 mmol/L 146(H) 143 142  Potassium 3.5 - 5.1 mmol/L 3.6 2.4(LL) 3.4(L)  Chloride 98 - 109 mmol/L 109 108 108  CO2 22 - 29 mmol/L 29 26 27   Calcium 8.4 - 10.4 mg/dL 9.8 8.8 9.7  Total Protein 6.4 - 8.3 g/dL 6.7 5.4(L) 6.8  Total Bilirubin 0.2 - 1.2 mg/dL 0.8 0.5 0.6  Alkaline Phos 40 - 150 U/L 103 92 113  AST 5 - 34 U/L 19 10 18   ALT 0 - 55 U/L 21 12 31    ANC 1.2k  PATHOLOGY  Diagnosis10/10/18 Stomach, biopsy, gastric ulcer - ADENOCARCINOMA. Microscopic Comment Several of the fragments are involved by moderately differentiated adenocarcinoma.   Diagnosis12/13/18 1. Liver, biopsy - BILE DUCT HAMARTOMA. - THERE IS NO EVIDENCE OF MALIGNANCY. 2. Lymph nodes, regional resection, portal - METASTATIC CARCINOMA IN 2 OF 6 LYMPH NODES (2/6). 3. Stomach, resection for tumor, distal - INVASIVE ADENOCARCINOMA, POORLY DIFFERENTIATED, SPANNING 3.8 CM. - PERINEURAL INVASION IS IDENTIFIED. - ADENOCARCINOMA INVOLVES THE SEROSA. - METASTATIC CARCINOMA IN 12 OF 30 LYMPH NODES (12/30), WITH EXTRACAPSULAR EXTENSION. - SEE ONCOLOGY TABLE BELOW. 4. Lymph node, biopsy, common hepatic artery - THERE IS NO EVIDENCE OF CARCINOMA IN 1 OF 1 LYMPH NODE (0/1). Microscopic Comment 3. STOMACH: Specimen: Stomach. Procedure: Partial gastrectomy. Tumor Site: Greater curvature. Tumor Size: 3.8 cm Histologic Type: Adenocarcinoma. Histologic  Grade: G3: poorly differentiated. Microscopic Extent of Tumor: Adenocarcinoma involves the serosa. Margins (select all that apply): Adenoca Proximal Margin: Negative for adenocarcinoma. Distal Margin: Negative for adenocarcinoma. Treatment Effect: N/A Lymph-Vascular Invasion: Not identified. Perineural Invasion: Present. Additional findings: Chronic gastritis. Ancillary testing: Can be performed upon clinician request. 1 of 3 Duplicate copy FINAL for CHANCELOR, HARDRICK ([id number]) Microscopic Comment(continued) Lymph nodes: number examined - 37; number positive: 14 Pathologic Staging: pT4a, pN3a (JBK:gt, 04/27/17)     RADIOGRAPHIC STUDIES: I have personally reviewed the radiological images as listed and agreed with the findings in the report. No results found.   ASSESSMENT & PLAN: This is a wonderful72 y.o.malewho presents into the clinic today to discuss the  following:   1. Primary adenocarcinoma of the pyloric antrum, pT4a, PN3a, Grade 3, stage IIIB -We reviewed imaging and surgical pathology with the patient and family in detail  -He underwent partial gastrostomy and lymph node D2 dissection with clear margins. -Due to his very high risk of cancer recurrence, I recommend him to have adjuvant chemotherapy to reduce her risk of recurrence. -Started Cycle 1 FOLFOX on 06/02/17.  -he is doing well, tolerating FOLFOX well. Will continue every 2 weeks, plan for a total of 12 cycles  -Labs today reviewed, he has developed a mild neutropenia, ANC 1.2K today, his weight is stable and he is doing well. He is okay to continue with Cycle 3 of FOLFOX today (06/30/17), will skip 5-FU bolus due to mild neutropenia. -F/u in 4 weeks   2. Anemia -Hgb 11.9 on 06/02/17, he will begin oral iron supplement -check iron studies next visit    3. Hypokalemia -K is 3.4 on 06/02/17, he will increase K in his diet.   4. Abdominal Pain -he complains of abdominal pain after large meal. He state she  is taking Oxycodone for this. I will gradually reduce this. I suggested he eat a smaller meal and avoid spicy food. I refilled today, 40 pills, plan not to refill anymore    PLAN -Labs reviewed, proceed with cycle 3 FOLFOX and continue every 2 weeks  - F/u in 4 weeks  -oxycodone refilled today, try not to refill in future     No orders of the defined types were placed in this encounter.  All questions were answered. The patient knows to call the clinic with any problems, questions or concerns. No barriers to learning was detected. I spent 20 minutes counseling the patient face to face. The total time spent in the appointment was 25 minutes and more than 50% was on counseling and review of test results  This document serves as a record of services personally performed by Truitt Merle, MD. It was created on her behalf by Theresia Bough, a trained medical scribe. The creation of this record is based on the scribe's personal observations and the provider's statements to them.   I have reviewed the above documentation for accuracy and completeness, and I agree with the above.     Truitt Merle, MD 06/30/17 5:37 PM

## 2017-06-29 ENCOUNTER — Other Ambulatory Visit: Payer: Self-pay | Admitting: *Deleted

## 2017-06-29 ENCOUNTER — Encounter: Payer: Self-pay | Admitting: *Deleted

## 2017-06-29 NOTE — Patient Outreach (Signed)
[facility]) Care Management   06/29/2017  Gabriel Poole 07-11-1945 [id number]  Gabriel Poole is an 72 y.o. male  Subjective: Routine home visit with pt, HIPAA verified, pt reports he receives chemotherapy every 2 weeks and "doing pretty well with it"  Pt reports he has all medications and taking as prescribed, pt states ex-wife Gabriel Poole continues to assist with transportation.  Pt reports he is not smoking.  Objective:   Vitals:   06/29/17 1420  BP: 140/80  Pulse: 69  Resp: 16  SpO2: 98%  Weight: 160 lb (72.6 kg)   ROS  Physical Exam  Constitutional: He is oriented to person, place, and time. He appears well-developed.  HENT:  Head: Normocephalic.  Neck: Normal range of motion. Neck supple.  Cardiovascular: Normal rate.  Irregular rhythm  Respiratory: Effort normal and breath sounds normal.  GI: Soft. Bowel sounds are normal.  Musculoskeletal: Normal range of motion. He exhibits no edema.  Neurological: He is alert and oriented to person, place, and time.  Skin: Skin is warm and dry.  Psychiatric: He has a normal mood and affect. His behavior is normal. Thought content normal.    Encounter Medications:   Outpatient Encounter Medications as of 06/29/2017  Medication Sig  . amLODipine (NORVASC) 10 MG tablet Take 1 tablet (10 mg total) by mouth daily.  Marland Kitchen aspirin EC 81 MG tablet Take 81 mg by mouth daily.  Marland Kitchen atorvastatin (LIPITOR) 40 MG tablet Take 1 tablet (40 mg total) by mouth daily at 6 PM.  . chlorproMAZINE (THORAZINE) 25 MG tablet Take 1 tablet (25 mg total) by mouth 3 (three) times daily as needed for hiccoughs.  . Cholecalciferol (VITAMIN D3) 5000 units CAPS Take 5,000 Units by mouth daily.  Marland Kitchen dexamethasone (DECADRON) 4 MG tablet Take 2 tablets (8 mg total) by mouth daily. Start the day after chemotherapy for 2 days. Take with food.  . lidocaine-prilocaine (EMLA) cream Apply to affected area once  . metoprolol tartrate (LOPRESSOR) 50 MG tablet  Take 50 mg by mouth 2 (two) times daily.  Marland Kitchen omeprazole (PRILOSEC) 20 MG capsule Take 20 mg by mouth daily.  Marland Kitchen oxyCODONE (OXY IR/ROXICODONE) 5 MG immediate release tablet Take 1-2 tablets (5-10 mg total) by mouth every 6 (six) hours as needed for moderate pain, severe pain or breakthrough pain.  . pantoprazole (PROTONIX) 40 MG tablet Take 1 tablet (40 mg total) by mouth 2 (two) times daily before a meal.  . prochlorperazine (COMPAZINE) 10 MG tablet Take 1 tablet (10 mg total) by mouth every 6 (six) hours as needed (Nausea or vomiting).  . ranitidine (ZANTAC) 300 MG tablet Take 300 mg by mouth at bedtime.  . sucralfate (CARAFATE) 1 g tablet Take 1 g by mouth 2 (two) times daily.  Marland Kitchen triamterene-hydrochlorothiazide (MAXZIDE-25) 37.5-25 MG tablet Take 1 tablet by mouth daily.  . ondansetron (ZOFRAN) 8 MG tablet Take 1 tablet (8 mg total) by mouth 2 (two) times daily as needed for refractory nausea / vomiting. Start on day 3 after chemotherapy. (Patient not taking: Reported on 06/29/2017)   No facility-administered encounter medications on file as of 06/29/2017.     Functional Status:   In your present state of health, do you have any difficulty performing the following activities: 06/08/2017 06/04/2017  Hearing? N N  Vision? N N  Difficulty concentrating or making decisions? N N  Walking or climbing stairs? N N  Dressing or bathing? N N  Doing errands, shopping? N N  Preparing Food and eating ? N Y  Using the Toilet? N N  In the past six months, have you accidently leaked urine? N N  Do you have problems with loss of bowel control? N N  Managing your Medications? N N  Managing your Finances? N N  Housekeeping or managing your Housekeeping? N Y  Some recent data might be hidden    Fall/Depression Screening:    Fall Risk  06/08/2017 06/04/2017 08/21/2015  Falls in the past year? No No No  Risk for fall due to : Medication side effect Medication side effect -   PHQ 2/9 Scores 06/08/2017 06/04/2017  05/26/2017 08/21/2015 05/31/2015 05/08/2015 03/07/2015  PHQ - 2 Score 0 0 0 0 0 0 2  PHQ- 9 Score - - - - - - 9    Assessment:  RN CM observed and reviewed medications with pt.  Patient's girlfriend present and states she knows what the medications are for and can help pt if he ever needs it.  RN CM wrote out phone number for transportation through [employer] [phone].  RN CM discussed discharge plan with pt and pt agreeable with RN CM discharge today,  [facility] CSW continues to work with pt.  [facility] CM Care Plan Problem One     Most Recent Value  Care Plan for Problem One  Active  [facility] Long Term Goal   Pt will verbalize/ demonstrate improved self care for new diagnosis cancer within 60 days  [facility] Long Term Goal Start Date  06/08/17  Interventions for Problem One Long Term Goal  RN CM reviewed medications with pt, did not observe oxycodone pain medication bottle, pt states he has pain medication but did not let RN CM see the bottle,  [facility] CM Short Term Goal #1   Pt will verbalize signs/ symptoms of infection and reportable signs/ symptoms within 30 days  [facility] CM Short Term Goal #1 Start Date  06/08/17  [facility] CM Short Term Goal #1 Met Date  06/29/17  Interventions for Short Term Goal #1  RN CM reinforced importance of avoiding sick persons, utilizing good handwashing, reviewed reporting change in symptoms to doctor early, reviewed signs/ symptom of infection.  [facility] CM Short Term Goal #2   Pt will work with [facility] CSW and cancer center for resources with transportation within 30 days.  [facility] CM Short Term Goal #2 Start Date  06/08/17  [facility] CM Short Term Goal #2 Met Date  06/29/17  Interventions for Short Term Goal #2  RN CM talked with caregiver Gabriel Poole and reported today's RN CM discharge and that [facility] continues to work with pt, reported RN CM wrote out for pt resource [place] [facility] [phone] that will provide one ride per week with 2 business days notice, Gabriel Poole  will be able to transport pt through end March and her surgery is April 1, RN CM sent in basket to [place] and informed of RN CM discharge and provided update.      Plan: discharge pt today  Jacqlyn Larsen [facility], BSN [place] Coordinator [phone]

## 2017-06-30 ENCOUNTER — Inpatient Hospital Stay: Payer: Medicare HMO

## 2017-06-30 ENCOUNTER — Inpatient Hospital Stay: Payer: Medicare HMO | Admitting: Nutrition

## 2017-06-30 ENCOUNTER — Telehealth: Payer: Self-pay | Admitting: *Deleted

## 2017-06-30 ENCOUNTER — Inpatient Hospital Stay (HOSPITAL_BASED_OUTPATIENT_CLINIC_OR_DEPARTMENT_OTHER): Payer: Medicare HMO | Admitting: Hematology

## 2017-06-30 ENCOUNTER — Encounter: Payer: Self-pay | Admitting: Hematology

## 2017-06-30 VITALS — BP 130/77 | HR 61 | Temp 98.4°F | Resp 18

## 2017-06-30 DIAGNOSIS — D649 Anemia, unspecified: Secondary | ICD-10-CM

## 2017-06-30 DIAGNOSIS — C162 Malignant neoplasm of body of stomach: Secondary | ICD-10-CM

## 2017-06-30 DIAGNOSIS — I10 Essential (primary) hypertension: Secondary | ICD-10-CM | POA: Diagnosis not present

## 2017-06-30 DIAGNOSIS — Z95828 Presence of other vascular implants and grafts: Secondary | ICD-10-CM

## 2017-06-30 DIAGNOSIS — Z5111 Encounter for antineoplastic chemotherapy: Secondary | ICD-10-CM | POA: Diagnosis not present

## 2017-06-30 DIAGNOSIS — E876 Hypokalemia: Secondary | ICD-10-CM | POA: Diagnosis not present

## 2017-06-30 DIAGNOSIS — Z79899 Other long term (current) drug therapy: Secondary | ICD-10-CM

## 2017-06-30 DIAGNOSIS — F17210 Nicotine dependence, cigarettes, uncomplicated: Secondary | ICD-10-CM

## 2017-06-30 DIAGNOSIS — M1990 Unspecified osteoarthritis, unspecified site: Secondary | ICD-10-CM | POA: Diagnosis not present

## 2017-06-30 DIAGNOSIS — R1310 Dysphagia, unspecified: Secondary | ICD-10-CM | POA: Diagnosis not present

## 2017-06-30 DIAGNOSIS — C163 Malignant neoplasm of pyloric antrum: Secondary | ICD-10-CM | POA: Diagnosis not present

## 2017-06-30 DIAGNOSIS — E785 Hyperlipidemia, unspecified: Secondary | ICD-10-CM | POA: Diagnosis not present

## 2017-06-30 LAB — CMP (CANCER CENTER ONLY)
ALBUMIN: 3.6 g/dL (ref 3.5–5.0)
ALK PHOS: 103 U/L (ref 40–150)
ALT: 21 U/L (ref 0–55)
AST: 19 U/L (ref 5–34)
Anion gap: 8 (ref 3–11)
BILIRUBIN TOTAL: 0.8 mg/dL (ref 0.2–1.2)
BUN: 11 mg/dL (ref 7–26)
CALCIUM: 9.8 mg/dL (ref 8.4–10.4)
CO2: 29 mmol/L (ref 22–29)
Chloride: 109 mmol/L (ref 98–109)
Creatinine: 1.16 mg/dL (ref 0.70–1.30)
GFR, Est AFR Am: >60 mL/min (ref >60)
GFR, Estimated: >60 mL/min (ref >60)
GLUCOSE: 86 mg/dL (ref 70–140)
Potassium: 3.6 mmol/L (ref 3.5–5.1)
SODIUM: 146 mmol/L — AB (ref 136–145)
TOTAL PROTEIN: 6.7 g/dL (ref 6.4–8.3)

## 2017-06-30 LAB — CBC WITH DIFFERENTIAL (CANCER CENTER ONLY)
BASOS ABS: 0.1 10*3/uL (ref 0.0–0.1)
BASOS PCT: 2 %
EOS PCT: 2 %
Eosinophils Absolute: 0.1 10*3/uL (ref 0.0–0.5)
HEMATOCRIT: 35.6 % — AB (ref 38.4–49.9)
Hemoglobin: 11.1 g/dL — ABNORMAL LOW (ref 13.0–17.1)
Lymphocytes Relative: 46 %
Lymphs Abs: 1.5 10*3/uL (ref 0.9–3.3)
MCH: 28.5 pg (ref 27.2–33.4)
MCHC: 31.2 g/dL — AB (ref 32.0–36.0)
MCV: 91.3 fL (ref 79.3–98.0)
MONO ABS: 0.4 10*3/uL (ref 0.1–0.9)
MONOS PCT: 12 %
NEUTROS ABS: 1.2 10*3/uL — AB (ref 1.5–6.5)
Neutrophils Relative %: 38 %
PLATELETS: 185 10*3/uL (ref 140–400)
RBC: 3.9 MIL/uL — ABNORMAL LOW (ref 4.20–5.82)
RDW: 14.9 % — AB (ref 11.0–14.6)
WBC Count: 3.2 10*3/uL — ABNORMAL LOW (ref 4.0–10.3)

## 2017-06-30 MED ORDER — DEXTROSE 5 % IV SOLN
Freq: Once | INTRAVENOUS | Status: AC
  Administered 2017-06-30: 12:00:00 via INTRAVENOUS

## 2017-06-30 MED ORDER — DEXTROSE 5 % IV SOLN
150.0000 mg | Freq: Once | INTRAVENOUS | Status: AC
  Administered 2017-06-30: 150 mg via INTRAVENOUS
  Filled 2017-06-30: qty 20

## 2017-06-30 MED ORDER — SODIUM CHLORIDE 0.9% FLUSH
10.0000 mL | INTRAVENOUS | Status: DC | PRN
  Administered 2017-06-30: 10 mL via INTRAVENOUS
  Filled 2017-06-30: qty 10

## 2017-06-30 MED ORDER — DEXAMETHASONE SODIUM PHOSPHATE 10 MG/ML IJ SOLN
INTRAMUSCULAR | Status: AC
Start: 2017-06-30 — End: ?
  Filled 2017-06-30: qty 1

## 2017-06-30 MED ORDER — PALONOSETRON HCL INJECTION 0.25 MG/5ML
INTRAVENOUS | Status: AC
  Filled 2017-06-30: qty 5

## 2017-06-30 MED ORDER — OXYCODONE HCL 5 MG PO TABS: 5.0000 mg | ORAL_TABLET | Freq: Two times a day (BID) | ORAL | 0 refills | Status: DC | PRN

## 2017-06-30 MED ORDER — LEUCOVORIN CALCIUM INJECTION 350 MG
400.0000 mg/m2 | Freq: Once | INTRAVENOUS | Status: AC
  Administered 2017-06-30: 744 mg via INTRAVENOUS
  Filled 2017-06-30: qty 37.2

## 2017-06-30 MED ORDER — PALONOSETRON HCL INJECTION 0.25 MG/5ML
0.2500 mg | Freq: Once | INTRAVENOUS | Status: AC
  Administered 2017-06-30: 0.25 mg via INTRAVENOUS

## 2017-06-30 MED ORDER — DEXAMETHASONE SODIUM PHOSPHATE 10 MG/ML IJ SOLN
10.0000 mg | Freq: Once | INTRAMUSCULAR | Status: AC
  Administered 2017-06-30: 10 mg via INTRAVENOUS

## 2017-06-30 MED ORDER — FLUOROURACIL CHEMO INJECTION 5 GM/100ML
2400.0000 mg/m2 | INTRAVENOUS | Status: DC
  Administered 2017-06-30: 4450 mg via INTRAVENOUS
  Filled 2017-06-30: qty 89

## 2017-06-30 NOTE — Telephone Encounter (Signed)
"  This is Cochise Dinneen calling to request later appointment today for [employer].  We have ice in [place]., schools delayed three hours.  I don't drive in ice, waiting for knee replacement.  If no appointment available today, can he wait till next week to come in?"

## 2017-06-30 NOTE — Progress Notes (Signed)
Nutrition follow-up completed with patient during infusion for gastric cancer. Patient's weight has increased and was documented as 160 pounds February 19 Patient reports his jejunostomy tube was removed He is not consuming oral nutrition supplements He attributes weight gain to adequate food intake  Nutrition diagnosis: Inadequate oral intake resolved  Recommended patient continue high-calorie high-protein diet as tolerated for weight maintenance/weight gain. Educated patient on the importance of weight maintenance Questions were answered teach back method used.  Patient will contact RD for questions as needed.  **Disclaimer: This note was dictated with voice recognition software. Similar sounding words can inadvertently be transcribed and this note may contain transcription errors which may not have been corrected upon publication of note.**

## 2017-06-30 NOTE — Telephone Encounter (Signed)
Spoke with pt and instructed pt to come as soon as he can and be safe.  Pt voiced understanding.

## 2017-07-02 ENCOUNTER — Inpatient Hospital Stay: Payer: Medicare HMO

## 2017-07-02 VITALS — BP 138/72 | HR 68 | Temp 98.4°F | Resp 18

## 2017-07-02 DIAGNOSIS — D649 Anemia, unspecified: Secondary | ICD-10-CM | POA: Diagnosis not present

## 2017-07-02 DIAGNOSIS — C162 Malignant neoplasm of body of stomach: Secondary | ICD-10-CM | POA: Diagnosis not present

## 2017-07-02 DIAGNOSIS — E876 Hypokalemia: Secondary | ICD-10-CM | POA: Diagnosis not present

## 2017-07-02 DIAGNOSIS — E785 Hyperlipidemia, unspecified: Secondary | ICD-10-CM | POA: Diagnosis not present

## 2017-07-02 DIAGNOSIS — I10 Essential (primary) hypertension: Secondary | ICD-10-CM | POA: Diagnosis not present

## 2017-07-02 DIAGNOSIS — M1990 Unspecified osteoarthritis, unspecified site: Secondary | ICD-10-CM | POA: Diagnosis not present

## 2017-07-02 DIAGNOSIS — Z5111 Encounter for antineoplastic chemotherapy: Secondary | ICD-10-CM | POA: Diagnosis not present

## 2017-07-02 DIAGNOSIS — F17210 Nicotine dependence, cigarettes, uncomplicated: Secondary | ICD-10-CM | POA: Diagnosis not present

## 2017-07-02 DIAGNOSIS — Z79899 Other long term (current) drug therapy: Secondary | ICD-10-CM | POA: Diagnosis not present

## 2017-07-02 MED ORDER — HEPARIN SOD (PORK) LOCK FLUSH 100 UNIT/ML IV SOLN
500.0000 [IU] | Freq: Once | INTRAVENOUS | Status: AC | PRN
  Administered 2017-07-02: 500 [IU]
  Filled 2017-07-02: qty 5

## 2017-07-02 MED ORDER — SODIUM CHLORIDE 0.9% FLUSH
10.0000 mL | INTRAVENOUS | Status: DC | PRN
  Administered 2017-07-02: 10 mL
  Filled 2017-07-02: qty 10

## 2017-07-02 NOTE — Progress Notes (Signed)
D/C Pts Chemo pump today. Pt states that he is having numbness in his fingers and toes since he took his chemo pump home. Talked with symptoms management. Sandi Mealy PA, came and talked with pt about symptoms. Pt is going to talk to Dr. Burr Medico next treatment about adjusting dosage. Told patient to call if he had anymore problems or concerns. Kasandra Knudsen LPN

## 2017-07-03 DIAGNOSIS — C162 Malignant neoplasm of body of stomach: Secondary | ICD-10-CM | POA: Diagnosis not present

## 2017-07-06 DIAGNOSIS — M1A0790 Idiopathic chronic gout, unspecified ankle and foot, without tophus (tophi): Secondary | ICD-10-CM | POA: Diagnosis not present

## 2017-07-06 DIAGNOSIS — C169 Malignant neoplasm of stomach, unspecified: Secondary | ICD-10-CM | POA: Diagnosis not present

## 2017-07-06 DIAGNOSIS — G629 Polyneuropathy, unspecified: Secondary | ICD-10-CM | POA: Diagnosis not present

## 2017-07-06 DIAGNOSIS — I10 Essential (primary) hypertension: Secondary | ICD-10-CM | POA: Diagnosis not present

## 2017-07-07 ENCOUNTER — Telehealth: Payer: Self-pay | Admitting: *Deleted

## 2017-07-07 NOTE — Telephone Encounter (Signed)
Received vm call from pt's ex-wife stating that she had a concern about pt & chemo treatment.  She asked to speak to Dr Burr Medico or Regan Rakers. Returned call & she states that pt came for pump d/c last Friday & c/o numbness/tingling of hands & feet & he states that he saw a PA & was told that he got too much chemo.  She states that he gets things mixed up sometimes & sometimes lies.  He also told her that his PCP wanted him to come in to talk & she took him & he was given a script for gabepentin.  She told him not to take until she cleared with Dr Burr Medico or Regan Rakers.  Reviewed chart & informed Dr Burr Medico of call & she called [name] back.

## 2017-07-13 NOTE — Progress Notes (Signed)
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 Microscopic Comment Several of the fragments are involved by moderately differentiated adenocarcinoma.      02/17/2017 Procedure    UPPER ENDOSCOPY  FINDINGS - Normal esophagus. - Z-line irregular, 44 cm from the incisors. - Red blood in the gastric body and in the gastric antrum. - Large gastric ulcer. Biopsied. - Erythematous mucosa in the antrum. - Normal cardia, gastric fundus, gastric body and pylorus. - Normal duodenal bulb and second portion of the duodenum. Comment: Endoscopic appearence concerning for malignant ulcer.      03/11/2017 Procedure    UPPER EUS PER DR. Ardis Hughs Findings: 1. The esophagus was normal. 2. There was a 2-3cm ulcerated, malignant mass  along the greater curvature of the stomach, approximately mid-body. The mass was non-circumferential. 3. The duodenum was normal. Endosonographic Finding 1. The gastric mass above correlated with a hypoechoic and heterogenous non-circumferential mass that measured 2.9cm across, 69mm deep. The endosonographic borders were poorly defined and there was clear sonographic evidence suggesting invasion into and through the muscularis propria layer without evident invasion into nearby organs (uT3). 2. The duodenal lymphnode described on recent CT scan appeared reactive by [place] criteria. 3. No perigastric adenopathy (uN0) 4. Limited views of the liver, spleen, pancreas, bile duct, gallbladder were all normal. - Along the greater curvature of the stomach, approximately mid-body, there is a 2.9cm uT3N0 (clinical stage IIB) gastric adenocarcinoma.      04/06/2017 Imaging    CT CHEST IMPRESSION: 1. No acute cardiopulmonary abnormalities. 2. Two small pulmonary nodules are noted measuring up to 5 mm. Nonspecific but warrant attention on follow-up imaging follow up 3. Nonspecific hyperdense, possibly enhancing lesion along the dome of liver is identified. In a patient that is at increased risk for more definitive assessment of this structure with contrast enhanced MRI of the liver is advised.      04/21/2017 Imaging    MR ABDOMEN IMPRESSION: 1. Enhancing 2.9 cm mass along the lesser curvature in the gastric antrum, compatible with known gastric malignancy . 2. Mildly enlarged gastrohepatic ligament lymph node, cannot exclude nodal metastasis. 3. No definite liver metastatic disease. Hyperenhancing 0.9 cm liver dome mass remains inconclusive, although the MRI features are most suggestive of a flash filling hemangioma, and the mass has been stable for nearly 2 months. Additional subcentimeter focus of hyperenhancement in the  lateral segment left liver lobe is most likely a benign transient vascular  phenomenon. Recommend attention to these lesions on a follow-up MRI abdomen without and with IV contrast in 3-6 months. This recommendation follows ACR consensus guidelines: Management of Incidental Liver Lesions on CT: A White Paper of the ACR Incidental Findings Committee. J Am Coll Radiol 2017; 33:2951-8841. 4. Benign right adrenal adenoma.        04/22/2017 Pathology Results    Diagnosis 1. Liver, biopsy - BILE DUCT HAMARTOMA. - THERE IS NO EVIDENCE OF MALIGNANCY. 2. Lymph nodes, regional resection, portal - METASTATIC CARCINOMA IN 2 OF 6 LYMPH NODES (2/6). 3. Stomach, resection for tumor, distal - INVASIVE ADENOCARCINOMA, POORLY DIFFERENTIATED, SPANNING 3.8 CM. - PERINEURAL INVASION IS IDENTIFIED. - ADENOCARCINOMA INVOLVES THE SEROSA. - METASTATIC CARCINOMA IN 12 OF 30 LYMPH NODES (12/30), WITH EXTRACAPSULAR EXTENSION. - SEE ONCOLOGY TABLE BELOW. 4. Lymph node, biopsy, common hepatic artery - THERE IS NO EVIDENCE OF CARCINOMA IN 1 OF 1 LYMPH NODE (0/1). Microscopic Comment 3. STOMACH: Specimen: Stomach. Procedure: Partial gastrectomy. Tumor Site: Greater curvature. Tumor Size: 3.8 cm Histologic Type: Adenocarcinoma. Histologic Grade: G3: poorly differentiated. Microscopic Extent of Tumor: Adenocarcinoma involves the serosa. Margins (select all that apply): Adenoca Proximal Margin: Negative for adenocarcinoma. Distal Margin: Negative for adenocarcinoma. Treatment Effect: N/A Lymph-Vascular Invasion: Not identified. Perineural Invasion: Present. Additional findings: Chronic gastritis. Ancillary testing: Can be performed upon clinician request. 1 of 3 Duplicate copy FINAL for Gabriel Poole, Gabriel Poole ([id number]) Microscopic Comment(continued) Lymph nodes: number examined - 37; number positive: 14 Pathologic Staging: pT4a, pN3a (JBK:gt, 04/27/17)      06/02/2017 -  Adjuvant Chemotherapy    FOLFOX every 2 weeks       HISTORY OF PRESENTING ILLNESS:  Gabriel Poole 72 y.o. male is here because of newly diagnosed gastric cancer. Initially, he began having symptoms of bilateral abdominal pain, bloating, and decreased appetite in 07/2016 that persisted for 1.5 months before seeking care with PCP. He reports 40 pounds weight loss over 4-5 months. Denies nausea, vomiting, constipation, diarrhea, GI bleeding, or early satiety. Then referred to gastroenterologist in [place]. Work up for gall stones was negative. He had good performance status and working at [employer]. EGD by Dr. Laural Golden noted ulcerated mass seen on the greater curvature. CT [facility] showed known malignancy in the antral region of the stomach and a single lymph node adjacent to the duodenal bulb. He was referred to Dr. Ardis Hughs for EUS which confirmed gastric mass spanning 2.9 cm across and 7 mm deep suggesting invasion into and through the muscularis propria without evidence of invasion into nearby organs (uT3); lymph node on CT appeared to be reactive (uN0).     He was referred to Dr. Barry Dienes for surgery consult. CT chest completed staging work up which indicated two non-specific pulmonary nodules and a non-specific, hyperdense, possibly enhancing lesion along the dome of the liver. F/u MR abdomen revealed no definitive liver metastasis, the hyperenhancing lesion is thought to be flash filling hemangioma. He then underwent laparoscopic diagnostic ERAS pathway with partial gastrectomy and jejunal feeding tube placement on 04/25/17. Surgical pathology revealed poorly differentiated adenocarcinoma of the stomach, spanning 3.8cm with perineural invasion and with involvement of the serosa. 12/30 lymph nodes were positive for metastatic carcinoma with extracapsular extension. 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 One lymph node was also surveyed along the common hepatic artery which was negative for carcinoma.  He was then referred to oncology for further management.   In the past he was diagnosed with arthritis, heart murmur, HTN, hyperlipidemia, gout, hydrocele, and elevated PSA. Previous colonoscopy in 2015 was positive for sessile polyp but otherwise unremarkable and he is on 5-year follow up plan. He drinks one alcoholic drink daily. He smoked 0.5 PPD for 45 years, quit smoking before surgery 04/2017. Family history is positive for 2 maternal aunts diagnosed with leukemia. He is divorced and re-married, has 3 children who are healthy. He is accompanied by his ex-wife today.  Since his surgery he has recovered well. He uses J-tube occasionally but feeds make him nauseous; gets most nutrition and hydration by mouth. He last flushed tube 2 days ago, he has 8/10 left abdominal pain and is asking for refill of pain medication today, often increases to 8/10 at night. BMs are regular, no n/v. He is able to care for himself and complete ADLs independently. He does not drive, but has family who can transport him for appointments, he lives in [place], approx 30 minutes from [place].   CURRENT THERAPY: Adjuvant FOLFOX q2 weeks started on 06/02/17. Reduced oxaliplatin starting with cycle 4 due to cold sensitivity and neuropathy.   INTERVAL HISTORY: Gabriel Poole is here for follow up and cycle 4 of FOLFOX. He presents to the infusion room today accompanied by his ex-wife.   He notes he ran out of Percocet and wanted some medication to help his foot pain due to neuropathy. He tried to get gabapentin but due to high copay of over $100 he could not. He is willing to apply to grant through our financial office.   On review of symptoms, pt notes increase in his numbness in his fingers where it was hard for him to open the car door. He has numbness and tingling in his fingers and toes when touching cold. Will occasionally have pain in feet with no relation to the cold. He notes he feels scar tissue from his scar tissue.     REVIEW OF SYSTEMS:    Constitutional: Denies fevers, chills or abnormal weight loss (+) feels cold  Eyes: Denies blurriness of vision Ears, nose, mouth, throat, and face: Denies mucositis or sore throat Respiratory: Denies cough, dyspnea or wheezes Cardiovascular: Denies palpitation, chest discomfort or lower extremity swelling Gastrointestinal:  Denies nausea, vomiting, constipation, diarrhea, heartburn or change in bowel habits (+) feeding tube, clamped Skin: Denies abnormal skin rashes Lymphatics: Denies new lymphadenopathy or easy bruising Neurological: Denies new weaknesses (+) cold sensitivity (+) pain in feet occasionally  Behavioral/Psych: Mood is stable, no new changes  All other systems were reviewed with the patient and are negative.   MEDICAL HISTORY:  Past Medical History:  Diagnosis Date  . Arthritis   . Cancer [facility]) dx oct 02-2017   stomach  . Gout   . Heart murmur   . Hyperlipidemia   . Hypertension   . Stomach cancer [facility])     SURGICAL HISTORY: Past Surgical History:  Procedure Laterality Date  . COLONOSCOPY    . ESOPHAGOGASTRODUODENOSCOPY N/A 02/17/2017   Procedure: ESOPHAGOGASTRODUODENOSCOPY (EGD);  Surgeon: Rogene Houston, MD;  [facility];  Service: Endoscopy;  Laterality: N/A;  3:00  . EUS N/A 03/11/2017   Procedure: UPPER ENDOSCOPIC ULTRASOUND (EUS) LINEAR;  Surgeon: Milus Banister, MD;  Location: [facility];  Service: Endoscopy;  Laterality: N/A;  . EUS N/A 03/11/2017   Procedure: UPPER ENDOSCOPIC ULTRASOUND (EUS) RADIAL;  Surgeon: Milus Banister,  MD;  Location: [facility];  Service: Endoscopy;  Laterality: N/A;  . FINGER SURGERY     Lt middle   . GASTRECTOMY N/A 04/22/2017   Procedure: PARTIAL GASTRECTOMY;  Surgeon: Stark Klein, MD;  Location: [facility];  Service: General;  Laterality: N/A;  . GASTROJEJUNOSTOMY N/A 04/22/2017   Procedure: JEJUNAL FEEDING TUBE PLACEMENT;  Surgeon: Stark Klein, MD;  Location: [facility];  Service:  General;  Laterality: N/A;  . HYDROCELE EXCISION  02/12/2011   Procedure: HYDROCELECTOMY ADULT;  Surgeon: Marissa Nestle;  Location: [facility];  Service: Urology;  Laterality: Left;  . LAPAROSCOPY N/A 04/22/2017   Procedure: LAPAROSCOPY DIAGNOSTIC ERAS PATHWAY;  Surgeon: Stark Klein, MD;  Location: [facility];  Service: General;  Laterality: N/A;  EPIDURAL  . PORTACATH PLACEMENT N/A 05/28/2017   Procedure: INSERTION PORT-A-CATH ERAS PATHWAY;  Surgeon: Stark Klein, MD;  Location: [facility];  Service: General;  Laterality: N/A;    I have reviewed the social history and family history with the patient and they are unchanged from previous note.  ALLERGIES:  is allergic to penicillins.  MEDICATIONS:  Current Outpatient Medications  Medication Sig Dispense Refill  . amLODipine (NORVASC) 10 MG tablet Take 1 tablet (10 mg total) by mouth daily. 90 tablet 0  . aspirin EC 81 MG tablet Take 81 mg by mouth daily.    Marland Kitchen atorvastatin (LIPITOR) 40 MG tablet Take 1 tablet (40 mg total) by mouth daily at 6 PM. 90 tablet 0  . chlorproMAZINE (THORAZINE) 25 MG tablet Take 1 tablet (25 mg total) by mouth 3 (three) times daily as needed for hiccoughs. 20 tablet 0  . Cholecalciferol (VITAMIN D3) 5000 units CAPS Take 5,000 Units by mouth daily.    Marland Kitchen dexamethasone (DECADRON) 4 MG tablet Take 2 tablets (8 mg total) by mouth daily. Start the day after chemotherapy for 2 days. Take with food. 30 tablet 1  . lidocaine-prilocaine (EMLA) cream Apply to affected area once 30 g 3  . metoprolol tartrate (LOPRESSOR) 50 MG tablet Take 50 mg by mouth 2 (two) times daily.    Marland Kitchen omeprazole (PRILOSEC) 20 MG capsule Take 20 mg by mouth daily.    . ondansetron (ZOFRAN) 8 MG tablet Take 1 tablet (8 mg total) by mouth 2 (two) times daily as needed for refractory nausea / vomiting. Start on day 3 after chemotherapy. (Patient not taking: Reported on 06/29/2017) 30 tablet 1  . oxyCODONE (OXY IR/ROXICODONE) 5 MG immediate  release tablet Take 1 tablet (5 mg total) by mouth every 12 (twelve) hours as needed for moderate pain, severe pain or breakthrough pain. 40 tablet 0  . pantoprazole (PROTONIX) 40 MG tablet Take 1 tablet (40 mg total) by mouth 2 (two) times daily before a meal. 60 tablet 4  . prochlorperazine (COMPAZINE) 10 MG tablet Take 1 tablet (10 mg total) by mouth every 6 (six) hours as needed (Nausea or vomiting). 30 tablet 1  . ranitidine (ZANTAC) 300 MG tablet Take 300 mg by mouth at bedtime.    . sucralfate (CARAFATE) 1 g tablet Take 1 g by mouth 2 (two) times daily.    Marland Kitchen triamterene-hydrochlorothiazide (MAXZIDE-25) 37.5-25 MG tablet Take 1 tablet by mouth daily.     No current facility-administered medications for this visit.    Facility-Administered Medications Ordered in Other Visits  Medication Dose Route Frequency Provider Last Rate Last Dose  . fluorouracil (ADRUCIL) 4,450 mg in sodium chloride 0.9 % 61 mL chemo infusion  2,400 mg/m2 (Treatment  Plan Recorded) Intravenous 1 day or 1 dose Truitt Merle, MD      . leucovorin 744 mg in dextrose 5 % 250 mL infusion  400 mg/m2 (Treatment Plan Recorded) Intravenous Once Truitt Merle, MD      . oxaliplatin (ELOXATIN) 130 mg in dextrose 5 % 500 mL chemo infusion  70 mg/m2 (Treatment Plan Recorded) Intravenous Once Truitt Merle, MD        PHYSICAL EXAMINATION: ECOG PERFORMANCE STATUS: 1 - Symptomatic but completely ambulatory Blood pressure 146/89, heart rate 70, respiratory rate 18, temperature 36.6, pulse ox 100% on room air GENERAL:alert, no distress and comfortable SKIN: skin color, texture, turgor are normal, no rashes or significant lesions EYES: normal, Conjunctiva are pink and non-injected, sclera clear OROPHARYNX:no exudate, no erythema and lips, buccal mucosa, and tongue normal  NECK: supple, thyroid normal size, non-tender, without nodularity LYMPH:  no palpable cervical, supraclavicular, axillary, or inguinal lymphadenopathy LUNGS: clear to  auscultation bilaterally with normal breathing effort HEART: regular rate & rhythm and no murmurs and no lower extremity edema ABDOMEN:abdomen soft, non-tender and normal bowel sounds. (+) vertical midline surgical incision is well healed (+) J tube to abdomen, clamped, site covered with gauze dressing c/d/i Musculoskeletal:no cyanosis of digits and no clubbing  NEURO: alert & oriented x 3 with fluent speech, no focal motor/sensory deficits PAC without erythema     LABORATORY DATA:  I have reviewed the data as listed CBC Latest Ref Rng & Units 07/14/2017 06/30/2017 06/16/2017  WBC 4.0 - 10.3 K/uL 5.4 3.2(L) 4.9  Hemoglobin 13.0 - 17.0 g/dL - - -  Hematocrit 38.4 - 49.9 % 38.8 35.6(L) 34.2(L)  Platelets 140 - 400 K/uL 186 185 240     CMP Latest Ref Rng & Units 07/14/2017 06/30/2017 06/16/2017  Glucose 70 - 140 mg/dL 94 86 122  BUN 7 - 26 mg/dL 14 11 11   Creatinine 0.70 - 1.30 mg/dL 1.22 1.16 1.01  Sodium 136 - 145 mmol/L 142 146(H) 143  Potassium 3.5 - 5.1 mmol/L 3.6 3.6 2.4(LL)  Chloride 98 - 109 mmol/L 105 109 108  CO2 22 - 29 mmol/L 28 29 26   Calcium 8.4 - 10.4 mg/dL 10.2 9.8 8.8  Total Protein 6.4 - 8.3 g/dL 6.8 6.7 5.4(L)  Total Bilirubin 0.2 - 1.2 mg/dL 0.9 0.8 0.5  Alkaline Phos 40 - 150 U/L 110 103 92  AST 5 - 34 U/L 15 19 10   ALT 0 - 55 U/L 15 21 12      PATHOLOGY  Diagnosis10/10/18 Stomach, biopsy, gastric ulcer - ADENOCARCINOMA. Microscopic Comment Several of the fragments are involved by moderately differentiated adenocarcinoma.   Diagnosis12/13/18 1. Liver, biopsy - BILE DUCT HAMARTOMA. - THERE IS NO EVIDENCE OF MALIGNANCY. 2. Lymph nodes, regional resection, portal - METASTATIC CARCINOMA IN 2 OF 6 LYMPH NODES (2/6). 3. Stomach, resection for tumor, distal - INVASIVE ADENOCARCINOMA, POORLY DIFFERENTIATED, SPANNING 3.8 CM. - PERINEURAL INVASION IS IDENTIFIED. - ADENOCARCINOMA INVOLVES THE SEROSA. - METASTATIC CARCINOMA IN 12 OF 30 LYMPH NODES (12/30), WITH  EXTRACAPSULAR EXTENSION. - SEE ONCOLOGY TABLE BELOW. 4. Lymph node, biopsy, common hepatic artery - THERE IS NO EVIDENCE OF CARCINOMA IN 1 OF 1 LYMPH NODE (0/1). Microscopic Comment 3. STOMACH: Specimen: Stomach. Procedure: Partial gastrectomy. Tumor Site: Greater curvature. Tumor Size: 3.8 cm Histologic Type: Adenocarcinoma. Histologic Grade: G3: poorly differentiated. Microscopic Extent of Tumor: Adenocarcinoma involves the serosa. Margins (select all that apply): Adenoca Proximal Margin: Negative for adenocarcinoma. Distal Margin: Negative for adenocarcinoma. Treatment Effect: N/A Lymph-Vascular Invasion:  Not identified. Perineural Invasion: Present. Additional findings: Chronic gastritis. Ancillary testing: Can be performed upon clinician request. 1 of 3 Duplicate copy FINAL for Gabriel Poole, Gabriel Poole ([id number]) Microscopic Comment(continued) Lymph nodes: number examined - 37; number positive: 14 Pathologic Staging: pT4a, pN3a (JBK:gt, 04/27/17)     RADIOGRAPHIC STUDIES: I have personally reviewed the radiological images as listed and agreed with the findings in the report. No results found.   ASSESSMENT & PLAN: This is a wonderful72 y.o.malewho presents into the clinic today to discuss the following:   1. Primary adenocarcinoma of the pyloric antrum, pT4a, PN3a, Grade 3, stage IIIB -We previously reviewed imaging and surgical pathology with the patient and family in detail  -He underwent partial gastrostomy and lymph node D2 dissection with clear margins. -Due to his very high risk of cancer recurrence, I recommend him to have adjuvant chemotherapy to reduce her risk of recurrence. -Started FOLFOX on 06/02/17.  -he is doing well, tolerating FOLFOX well. Will continue every 2 weeks, plan for a total of 12 cycles  -He has developed significant cold sensitivity. I encouraged him to stay away from the cold and cold drinks and substances. He has very mild neuropathy in his  feet.   -I recommend gabapentin, but his copay for this is high. I recommend he see our financial office to apply for a grant. He is willing to see them with the needed paperwork.  -If he is not able to get grant I will prescribed Cymbalta  -I will also reduce oxaliplatin dose today with cycle 4 today due to his neuropathy.  -Labs reviewed, and adequate to proceed with cycle 4 today  -F/u in 2 weeks   2. Anemia  -Hgb 11.9 on 06/02/17, he will begin oral iron supplement -Hg improved to 12.2 today. Iron study still pending. Continue oral iron   3. Hypokalemia  -K is 3.4 on 06/02/17, he will increase K in his diet. -K normalized to 3.6 today (07/14/17), resolved   4. Abdominal Pain -Pt complained of abdominal pain after large meal. He state he is taking Oxycodone for this. I will gradually reduce this. I suggested he eat a smaller meal and avoid spicy food. -His abdominal pain has resolved -I refilled oxycodone on 06/30/17, 40 pills, plan not to refill anymore  5.  Cold sensitivity and mild peripheral neuropathy -His cold sensitivity has gotten worse, he also developed some peripheral neuropathy, intermittent.  -I recommend him to try gabapentin -We will not refill his narcotics  PLAN -Labs reviewed, proceed with cycle 3 FOLFOX with oxaliplatin dose reduced to 70 mg/m and continue every 2 weeks  - F/u in 2 weeks  -I will call in Neurontin to [employer], and encouraged him to apply for Fatima Sanger in our cancer center to cover his co-pay  No orders of the defined types were placed in this encounter.  All questions were answered. The patient knows to call the clinic with any problems, questions or concerns. No barriers to learning was detected. I spent 20 minutes counseling the patient face to face. The total time spent in the appointment was 25 minutes and more than 50% was on counseling and review of test results  This document serves as a record of services personally performed by  Truitt Merle, MD. It was created on her behalf by Joslyn Devon, a trained medical scribe. The creation of this record is based on the scribe's personal observations and the provider's statements to them.    I have reviewed  the above documentation for accuracy and completeness, and I agree with the above.     Truitt Merle, MD 07/14/17 12:39 PM

## 2017-07-14 ENCOUNTER — Encounter: Payer: Self-pay | Admitting: Hematology

## 2017-07-14 ENCOUNTER — Inpatient Hospital Stay: Payer: Medicare HMO

## 2017-07-14 ENCOUNTER — Inpatient Hospital Stay (HOSPITAL_BASED_OUTPATIENT_CLINIC_OR_DEPARTMENT_OTHER): Payer: Medicare HMO | Admitting: Hematology

## 2017-07-14 ENCOUNTER — Inpatient Hospital Stay: Payer: Medicare HMO | Attending: Nurse Practitioner

## 2017-07-14 VITALS — BP 146/89 | HR 70 | Temp 97.8°F | Resp 18 | Wt 150.8 lb

## 2017-07-14 DIAGNOSIS — T451X5A Adverse effect of antineoplastic and immunosuppressive drugs, initial encounter: Secondary | ICD-10-CM

## 2017-07-14 DIAGNOSIS — E876 Hypokalemia: Secondary | ICD-10-CM | POA: Diagnosis not present

## 2017-07-14 DIAGNOSIS — Z79899 Other long term (current) drug therapy: Secondary | ICD-10-CM | POA: Diagnosis not present

## 2017-07-14 DIAGNOSIS — Z931 Gastrostomy status: Secondary | ICD-10-CM | POA: Diagnosis not present

## 2017-07-14 DIAGNOSIS — I10 Essential (primary) hypertension: Secondary | ICD-10-CM

## 2017-07-14 DIAGNOSIS — G620 Drug-induced polyneuropathy: Secondary | ICD-10-CM

## 2017-07-14 DIAGNOSIS — D649 Anemia, unspecified: Secondary | ICD-10-CM

## 2017-07-14 DIAGNOSIS — J069 Acute upper respiratory infection, unspecified: Secondary | ICD-10-CM | POA: Diagnosis not present

## 2017-07-14 DIAGNOSIS — C162 Malignant neoplasm of body of stomach: Secondary | ICD-10-CM

## 2017-07-14 DIAGNOSIS — Z5111 Encounter for antineoplastic chemotherapy: Secondary | ICD-10-CM | POA: Diagnosis not present

## 2017-07-14 DIAGNOSIS — Z95828 Presence of other vascular implants and grafts: Secondary | ICD-10-CM

## 2017-07-14 LAB — CBC WITH DIFFERENTIAL (CANCER CENTER ONLY)
Basophils Absolute: 0.1 10*3/uL (ref 0.0–0.1)
Basophils Relative: 1 %
Eosinophils Absolute: 0.1 10*3/uL (ref 0.0–0.5)
Eosinophils Relative: 1 %
HEMATOCRIT: 38.8 % (ref 38.4–49.9)
HEMOGLOBIN: 12.2 g/dL — AB (ref 13.0–17.1)
LYMPHS ABS: 1.9 10*3/uL (ref 0.9–3.3)
Lymphocytes Relative: 35 %
MCH: 28.6 pg (ref 27.2–33.4)
MCHC: 31.4 g/dL — AB (ref 32.0–36.0)
MCV: 90.9 fL (ref 79.3–98.0)
MONOS PCT: 14 %
Monocytes Absolute: 0.8 10*3/uL (ref 0.1–0.9)
NEUTROS ABS: 2.7 10*3/uL (ref 1.5–6.5)
NEUTROS PCT: 49 %
Platelet Count: 186 10*3/uL (ref 140–400)
RBC: 4.27 MIL/uL (ref 4.20–5.82)
RDW: 15.6 % — ABNORMAL HIGH (ref 11.0–14.6)
WBC Count: 5.4 10*3/uL (ref 4.0–10.3)

## 2017-07-14 LAB — IRON AND TIBC
Iron: 96 ug/dL (ref 42–163)
Saturation Ratios: 31 % — ABNORMAL LOW (ref 42–163)
TIBC: 311 ug/dL (ref 202–409)
UIBC: 216 ug/dL

## 2017-07-14 LAB — CMP (CANCER CENTER ONLY)
ALK PHOS: 110 U/L (ref 40–150)
ALT: 15 U/L (ref 0–55)
AST: 15 U/L (ref 5–34)
Albumin: 3.6 g/dL (ref 3.5–5.0)
Anion gap: 9 (ref 3–11)
BUN: 14 mg/dL (ref 7–26)
CALCIUM: 10.2 mg/dL (ref 8.4–10.4)
CHLORIDE: 105 mmol/L (ref 98–109)
CO2: 28 mmol/L (ref 22–29)
CREATININE: 1.22 mg/dL (ref 0.70–1.30)
GFR, EST NON AFRICAN AMERICAN: 58 mL/min — AB (ref >60)
GFR, Est AFR Am: >60 mL/min (ref >60)
Glucose, Bld: 94 mg/dL (ref 70–140)
Potassium: 3.6 mmol/L (ref 3.5–5.1)
SODIUM: 142 mmol/L (ref 136–145)
Total Bilirubin: 0.9 mg/dL (ref 0.2–1.2)
Total Protein: 6.8 g/dL (ref 6.4–8.3)

## 2017-07-14 LAB — FERRITIN: Ferritin: 314 ng/mL (ref 22–316)

## 2017-07-14 MED ORDER — DEXAMETHASONE SODIUM PHOSPHATE 10 MG/ML IJ SOLN
10.0000 mg | Freq: Once | INTRAMUSCULAR | Status: AC
  Administered 2017-07-14: 10 mg via INTRAVENOUS

## 2017-07-14 MED ORDER — PALONOSETRON HCL INJECTION 0.25 MG/5ML
0.2500 mg | Freq: Once | INTRAVENOUS | Status: AC
  Administered 2017-07-14: 0.25 mg via INTRAVENOUS

## 2017-07-14 MED ORDER — PALONOSETRON HCL INJECTION 0.25 MG/5ML
INTRAVENOUS | Status: AC
  Filled 2017-07-14: qty 5

## 2017-07-14 MED ORDER — DEXTROSE 5 % IV SOLN
70.0000 mg/m2 | Freq: Once | INTRAVENOUS | Status: AC
  Administered 2017-07-14: 130 mg via INTRAVENOUS
  Filled 2017-07-14: qty 6

## 2017-07-14 MED ORDER — DEXAMETHASONE SODIUM PHOSPHATE 10 MG/ML IJ SOLN
INTRAMUSCULAR | Status: AC
  Filled 2017-07-14: qty 1

## 2017-07-14 MED ORDER — FLUOROURACIL CHEMO INJECTION 5 GM/100ML
2400.0000 mg/m2 | INTRAVENOUS | Status: DC
  Administered 2017-07-14: 4450 mg via INTRAVENOUS
  Filled 2017-07-14: qty 89

## 2017-07-14 MED ORDER — LEUCOVORIN CALCIUM INJECTION 350 MG
400.0000 mg/m2 | Freq: Once | INTRAMUSCULAR | Status: AC
  Administered 2017-07-14: 744 mg via INTRAVENOUS
  Filled 2017-07-14: qty 37.2

## 2017-07-14 MED ORDER — SODIUM CHLORIDE 0.9% FLUSH
10.0000 mL | INTRAVENOUS | Status: DC | PRN
  Administered 2017-07-14: 10 mL via INTRAVENOUS
  Filled 2017-07-14: qty 10

## 2017-07-14 MED ORDER — DEXTROSE 5 % IV SOLN
Freq: Once | INTRAVENOUS | Status: AC
  Administered 2017-07-14: 12:00:00 via INTRAVENOUS

## 2017-07-14 NOTE — Patient Instructions (Signed)
[facility] Discharge Instructions for Patients Receiving Chemotherapy  Today you received the following chemotherapy agents: Oxaliplatin, Leucovorin, and 5FU.  To help prevent nausea and vomiting after your treatment, we encourage you to take your nausea medication as directed.   If you develop nausea and vomiting that is not controlled by your nausea medication, call the clinic.   BELOW ARE SYMPTOMS THAT SHOULD BE REPORTED IMMEDIATELY:  *FEVER GREATER THAN 100.5 F  *CHILLS WITH OR WITHOUT FEVER  NAUSEA AND VOMITING THAT IS NOT CONTROLLED WITH YOUR NAUSEA MEDICATION  *UNUSUAL SHORTNESS OF BREATH  *UNUSUAL BRUISING OR BLEEDING  TENDERNESS IN MOUTH AND THROAT WITH OR WITHOUT PRESENCE OF ULCERS  *URINARY PROBLEMS  *BOWEL PROBLEMS  UNUSUAL RASH Items with * indicate a potential emergency and should be followed up as soon as possible.  Feel free to call the clinic should you have any questions or concerns. The clinic phone number is [phone].  Please show the CHEMO ALERT CARD at check-in to the Emergency Department and triage nurse.    

## 2017-07-14 NOTE — Progress Notes (Signed)
Patient came in requesting financial assistance for oral medications.  Advised that I left a message for him earlier in the month to discuss. Asked patient about income. Patient receives SSI. Asked patient if he can bring proof of income to apply for grant. He states he can bring on Friday when he comes. Based on verbal income, he is approved. Asked patient if he has what he needs today and he states yes was given medication to hold him over.

## 2017-07-15 ENCOUNTER — Encounter: Payer: Self-pay | Admitting: Hematology

## 2017-07-15 DIAGNOSIS — G620 Drug-induced polyneuropathy: Secondary | ICD-10-CM | POA: Insufficient documentation

## 2017-07-15 DIAGNOSIS — T451X5A Adverse effect of antineoplastic and immunosuppressive drugs, initial encounter: Secondary | ICD-10-CM

## 2017-07-15 MED ORDER — GABAPENTIN 300 MG PO CAPS: 300.0000 mg | ORAL_CAPSULE | Freq: Two times a day (BID) | ORAL | 1 refills | Status: DC

## 2017-07-15 MED FILL — GABAPENTIN 300 MG CAPSULE: 300 | 30 days supply | Qty: 60 | Fill #0

## 2017-07-16 ENCOUNTER — Inpatient Hospital Stay: Payer: Medicare HMO

## 2017-07-16 ENCOUNTER — Other Ambulatory Visit: Payer: Self-pay | Admitting: Emergency Medicine

## 2017-07-16 ENCOUNTER — Other Ambulatory Visit: Payer: Self-pay | Admitting: Nurse Practitioner

## 2017-07-16 ENCOUNTER — Encounter: Payer: Self-pay | Admitting: Nurse Practitioner

## 2017-07-16 VITALS — BP 130/77 | HR 64 | Temp 97.7°F | Resp 18

## 2017-07-16 DIAGNOSIS — G620 Drug-induced polyneuropathy: Secondary | ICD-10-CM

## 2017-07-16 DIAGNOSIS — Z79899 Other long term (current) drug therapy: Secondary | ICD-10-CM | POA: Diagnosis not present

## 2017-07-16 DIAGNOSIS — C162 Malignant neoplasm of body of stomach: Secondary | ICD-10-CM | POA: Diagnosis not present

## 2017-07-16 DIAGNOSIS — R066 Hiccough: Secondary | ICD-10-CM

## 2017-07-16 DIAGNOSIS — Z95828 Presence of other vascular implants and grafts: Secondary | ICD-10-CM

## 2017-07-16 DIAGNOSIS — Z931 Gastrostomy status: Secondary | ICD-10-CM | POA: Diagnosis not present

## 2017-07-16 DIAGNOSIS — E876 Hypokalemia: Secondary | ICD-10-CM | POA: Diagnosis not present

## 2017-07-16 DIAGNOSIS — Z5111 Encounter for antineoplastic chemotherapy: Secondary | ICD-10-CM | POA: Diagnosis not present

## 2017-07-16 DIAGNOSIS — J069 Acute upper respiratory infection, unspecified: Secondary | ICD-10-CM | POA: Diagnosis not present

## 2017-07-16 DIAGNOSIS — D649 Anemia, unspecified: Secondary | ICD-10-CM | POA: Diagnosis not present

## 2017-07-16 MED ORDER — GABAPENTIN 300 MG PO CAPS: 300.0000 mg | ORAL_CAPSULE | Freq: Two times a day (BID) | ORAL | 1 refills | Status: DC

## 2017-07-16 MED ORDER — HEPARIN SOD (PORK) LOCK FLUSH 100 UNIT/ML IV SOLN
500.0000 [IU] | Freq: Once | INTRAVENOUS | Status: AC | PRN
  Administered 2017-07-16: 500 [IU] via INTRAVENOUS
  Filled 2017-07-16: qty 5

## 2017-07-16 MED ORDER — SODIUM CHLORIDE 0.9% FLUSH
10.0000 mL | INTRAVENOUS | Status: DC | PRN
  Administered 2017-07-16: 10 mL via INTRAVENOUS
  Filled 2017-07-16: qty 10

## 2017-07-16 MED ORDER — CHLORPROMAZINE HCL 25 MG PO TABS
25.0000 mg | ORAL_TABLET | Freq: Three times a day (TID) | ORAL | 0 refills | Status: DC | PRN
Start: 2017-07-16 — End: 2017-09-08

## 2017-07-16 MED FILL — CHLORPROMAZINE 25 MG TABLET: 25 | 6 days supply | Qty: 20 | Fill #0

## 2017-07-16 NOTE — Progress Notes (Signed)
Met with patient whom brought proof of income for the one-time $400 [place] to assist with medications.  Asked Lacie NP to send prescriptions to [facility] for him to be able to use the grant and she said sure and sent them over electronically.   Patient approved for one-time $400 [facility]. Patient has a copy of the approval letter along with the Outpatient pharmacy information and will be going to pharmacy to pick up today.   Patient given my card for any additional financial questions or concerns.

## 2017-07-21 ENCOUNTER — Other Ambulatory Visit: Payer: Self-pay | Admitting: Hematology

## 2017-07-21 ENCOUNTER — Telehealth: Payer: Self-pay | Admitting: *Deleted

## 2017-07-21 MED ORDER — DULOXETINE HCL 60 MG PO CPEP: 60.0000 mg | ORAL_CAPSULE | Freq: Every day | ORAL | 1 refills | Status: DC

## 2017-07-21 MED FILL — DULoxetine HCL 60 MG CPEP: 60 | 30 days supply | Qty: 30 | Fill #0

## 2017-07-21 NOTE — Telephone Encounter (Signed)
Pt called earlier today & left message that his fingers & toes were still numb & he wants pain med.  He reports that the neurontin is not helping & is no good.  Returned call after discussing with Dr Burr Medico & informed pt that Dr Burr Medico would call in script of cymbalta to try.  He needs to stop the neurontin when taking cymbalta.  He states he has taken cymbalta before & it is no good.  He was prescribed this med for anxiety/depression.  Informed that it can be given for this reason but it can also be used for a different reason-neuropathy pain.  Dr Burr Medico would prefer to try this & not give opoid meds.  He will think about this & I will call him back later. He has f/u appt on Wed with Dr Burr Medico also.

## 2017-07-22 NOTE — Telephone Encounter (Signed)
Called pt this am & he does not want to take the cymbalta & will discuss with Lacie NP at next OV.

## 2017-07-26 ENCOUNTER — Other Ambulatory Visit: Payer: Self-pay | Admitting: Licensed Clinical Social Worker

## 2017-07-26 NOTE — Patient Outreach (Signed)
Assessment:  CSW completed chart review on client on 07/26/17. RN Gabriel Poole has recently discharged client form [facility] nursing services. Client and CSW have talked about resources for transportation for client. CSW spoke via phone with Gabriel Poole, ex-wife of client. Gabriel Poole has previously given CSW permission for CSW to speak with Gabriel Poole regarding client needs. Gabriel Poole is transporting client to and from client's cancer treatments. Gabriel Poole said that she is scheduled for her own knee surgery on August 09, 2017 and will not be able to transport client to and from his appointments after this surgery.  CSW spoke with Gabriel Poole about client care plan.  CSW encouraged that client contact either Gabriel Poole group for transport help or local [facility] in [place] , [place] to arrange transport for client to and from his cancer treatments.  CSW also encouraged that client work with his church to see if volunteers from church might help client with transport needs to his cancer treatments.  CSW thanked Gabriel Poole for phone call with CSW on 07/26/17. CSW encouraged that client or she call CSW at [phone] as needed to discuss social work needs of client.    Plan:  Client or Gabriel Poole to continue to communicate with CSW in next 30 days to discuss transport options for client to go to and from client's cancer treatments.  CSW to call client or Gabriel Poole in 4 weeks to assess client needs.  Gabriel Poole.Gabriel Poole MSW, LCSW Licensed Clinical Social Worker [facility] Management [phone]

## 2017-07-28 ENCOUNTER — Telehealth: Payer: Self-pay | Admitting: *Deleted

## 2017-07-28 ENCOUNTER — Ambulatory Visit: Payer: Medicare HMO

## 2017-07-28 ENCOUNTER — Inpatient Hospital Stay: Payer: Medicare HMO

## 2017-07-28 ENCOUNTER — Encounter: Payer: Self-pay | Admitting: Hematology

## 2017-07-28 ENCOUNTER — Ambulatory Visit: Payer: Medicare HMO | Admitting: Nurse Practitioner

## 2017-07-28 ENCOUNTER — Other Ambulatory Visit: Payer: Medicare HMO

## 2017-07-28 ENCOUNTER — Inpatient Hospital Stay (HOSPITAL_BASED_OUTPATIENT_CLINIC_OR_DEPARTMENT_OTHER): Payer: Medicare HMO | Admitting: Hematology

## 2017-07-28 VITALS — BP 130/76 | HR 91 | Temp 99.6°F | Resp 18

## 2017-07-28 DIAGNOSIS — E876 Hypokalemia: Secondary | ICD-10-CM | POA: Diagnosis not present

## 2017-07-28 DIAGNOSIS — C162 Malignant neoplasm of body of stomach: Secondary | ICD-10-CM | POA: Diagnosis not present

## 2017-07-28 DIAGNOSIS — Z79899 Other long term (current) drug therapy: Secondary | ICD-10-CM | POA: Diagnosis not present

## 2017-07-28 DIAGNOSIS — D649 Anemia, unspecified: Secondary | ICD-10-CM | POA: Diagnosis not present

## 2017-07-28 DIAGNOSIS — Z5111 Encounter for antineoplastic chemotherapy: Secondary | ICD-10-CM | POA: Diagnosis not present

## 2017-07-28 DIAGNOSIS — J069 Acute upper respiratory infection, unspecified: Secondary | ICD-10-CM | POA: Diagnosis not present

## 2017-07-28 DIAGNOSIS — Z931 Gastrostomy status: Secondary | ICD-10-CM

## 2017-07-28 DIAGNOSIS — R509 Fever, unspecified: Secondary | ICD-10-CM

## 2017-07-28 DIAGNOSIS — K219 Gastro-esophageal reflux disease without esophagitis: Secondary | ICD-10-CM

## 2017-07-28 LAB — INFLUENZA PANEL BY PCR (TYPE A & B)
INFLAPCR: NEGATIVE
Influenza B By PCR: NEGATIVE

## 2017-07-28 MED ORDER — AZITHROMYCIN 500 MG PO TABS: 500.0000 mg | ORAL_TABLET | Freq: Every day | ORAL | 0 refills | Status: DC

## 2017-07-28 MED ORDER — SUCRALFATE 1 G PO TABS: 1.0000 g | ORAL_TABLET | Freq: Two times a day (BID) | ORAL | 0 refills | Status: DC

## 2017-07-28 MED ORDER — PANTOPRAZOLE SODIUM 40 MG PO TBEC: 40.0000 mg | DELAYED_RELEASE_TABLET | Freq: Two times a day (BID) | ORAL | 4 refills | Status: DC

## 2017-07-28 MED FILL — AZITHROMYCIN 500 MG TABLET: 500 | 5 days supply | Qty: 5 | Fill #0

## 2017-07-28 MED FILL — PANTOPRAZOLE SOD DR 40 MG T: 40 | 30 days supply | Qty: 60 | Fill #0

## 2017-07-28 MED FILL — SUCRALFATE 1 GM TABLET: 1 | 30 days supply | Qty: 60 | Fill #0

## 2017-07-28 NOTE — Progress Notes (Signed)
Nasal Swab done for Flu A and B. Pt tolerated well

## 2017-07-28 NOTE — Progress Notes (Signed)
Called pt on cell phone and left message on voice mail re:  Flu test resulted  Negative.

## 2017-07-28 NOTE — Telephone Encounter (Signed)
Received call from Ms Discher this am stating that pt had a cold with nasal congestion & temp 99 last hs.  She wants to know if he should get treatment.  She had talked with On-Call RN last hs & was informed to call this am.  Discussed with DR Burr Medico & probably ok to treat but if he feels bad & has yellow secretions & fever may need to be seen.  May also move appt to early next week.  Called Ms Froh back & informed that since he had an appt for lab & Lacie, he should probably come on in & get checked out.  She states she will get him here.

## 2017-07-28 NOTE — Progress Notes (Signed)
[place]  [phone] Fax:([phone]) [phone]  Clinic Follow up Note   Patient Care Team: Octavio Graves, DO as PCP - General Truitt Merle, MD as Consulting Physician (Hematology) Alla Feeling, NP as Nurse Practitioner (Nurse Practitioner) Stark Klein, MD as Consulting Physician (General Surgery) Rogene Houston, MD as Consulting Physician (Gastroenterology) Milus Banister, MD as Attending Physician (Gastroenterology) Shea Evans [name], LCSW as [facility] (Licensed Clinical Social Worker)   Date of Service:  07/28/2017   CHIEF COMPLAINTS:  Fever and cough  SUMMARY OF ONCOLOGIC HISTORY: Oncology History   Cancer Staging Malignant neoplasm of body of stomach ([place]) Staging form: Stomach, AJCC 8th Edition - Clinical stage from 03/11/2017: Stage IIB (cT3, cN0, cM0) - Unsigned - Pathologic stage from 04/22/2017: Stage IIIB (pT4a, pN3a, cM0) - Signed by Alla Feeling, NP on 05/17/2017       Malignant neoplasm of body of stomach ([place])   02/17/2017 Initial Diagnosis    Malignant neoplasm of body of stomach ([place])      02/17/2017 Pathology Results    Diagnosis Stomach, biopsy, gastric ulcer - ADENOCARCINOMA. Microscopic Comment Several of the fragments are involved by moderately differentiated adenocarcinoma.      02/17/2017 Procedure    UPPER ENDOSCOPY  FINDINGS - Normal esophagus. - Z-line irregular, 44 cm from the incisors. - Red blood in the gastric body and in the gastric antrum. - Large gastric ulcer. Biopsied. - Erythematous mucosa in the antrum. - Normal cardia, gastric fundus, gastric body and pylorus. - Normal duodenal bulb and second portion of the duodenum. Comment: Endoscopic appearence concerning for malignant ulcer.      03/11/2017 Procedure    UPPER EUS PER DR. Ardis Hughs Findings: 1. The esophagus was normal. 2. There was a 2-3cm ulcerated, malignant mass along the greater curvature of the  stomach, approximately mid-body. The mass was non-circumferential. 3. The duodenum was normal. Endosonographic Finding 1. The gastric mass above correlated with a hypoechoic and heterogenous non-circumferential mass that measured 2.9cm across, 34mm deep. The endosonographic borders were poorly defined and there was clear sonographic evidence suggesting invasion into and through the muscularis propria layer without evident invasion into nearby organs (uT3). 2. The duodenal lymphnode described on recent CT scan appeared reactive by [place] criteria. 3. No perigastric adenopathy (uN0) 4. Limited views of the liver, spleen, pancreas, bile duct, gallbladder were all normal. - Along the greater curvature of the stomach, approximately mid-body, there is a 2.9cm uT3N0 (clinical stage IIB) gastric adenocarcinoma.      04/06/2017 Imaging    CT CHEST IMPRESSION: 1. No acute cardiopulmonary abnormalities. 2. Two small pulmonary nodules are noted measuring up to 5 mm. Nonspecific but warrant attention on follow-up imaging follow up 3. Nonspecific hyperdense, possibly enhancing lesion along the dome of liver is identified. In a patient that is at increased risk for more definitive assessment of this structure with contrast enhanced MRI of the liver is advised.      04/21/2017 Imaging    MR ABDOMEN IMPRESSION: 1. Enhancing 2.9 cm mass along the lesser curvature in the gastric antrum, compatible with known gastric malignancy . 2. Mildly enlarged gastrohepatic ligament lymph node, cannot exclude nodal metastasis. 3. No definite liver metastatic disease. Hyperenhancing 0.9 cm liver dome mass remains inconclusive, although the MRI features are most suggestive of a flash filling hemangioma, and the mass has been stable for nearly 2 months. Additional subcentimeter focus of hyperenhancement in the lateral segment left liver lobe is  most likely a benign transient vascular phenomenon. Recommend attention to  these lesions on a follow-up MRI abdomen without and with IV contrast in 3-6 months. This recommendation follows ACR consensus guidelines: Management of Incidental Liver Lesions on CT: A White Paper of the ACR Incidental Findings Committee. J Am Coll Radiol 2017; 78:2956-2130. 4. Benign right adrenal adenoma.        04/22/2017 Pathology Results    Diagnosis 1. Liver, biopsy - BILE DUCT HAMARTOMA. - THERE IS NO EVIDENCE OF MALIGNANCY. 2. Lymph nodes, regional resection, portal - METASTATIC CARCINOMA IN 2 OF 6 LYMPH NODES (2/6). 3. Stomach, resection for tumor, distal - INVASIVE ADENOCARCINOMA, POORLY DIFFERENTIATED, SPANNING 3.8 CM. - PERINEURAL INVASION IS IDENTIFIED. - ADENOCARCINOMA INVOLVES THE SEROSA. - METASTATIC CARCINOMA IN 12 OF 30 LYMPH NODES (12/30), WITH EXTRACAPSULAR EXTENSION. - SEE ONCOLOGY TABLE BELOW. 4. Lymph node, biopsy, common hepatic artery - THERE IS NO EVIDENCE OF CARCINOMA IN 1 OF 1 LYMPH NODE (0/1). Microscopic Comment 3. STOMACH: Specimen: Stomach. Procedure: Partial gastrectomy. Tumor Site: Greater curvature. Tumor Size: 3.8 cm Histologic Type: Adenocarcinoma. Histologic Grade: G3: poorly differentiated. Microscopic Extent of Tumor: Adenocarcinoma involves the serosa. Margins (select all that apply): Adenoca Proximal Margin: Negative for adenocarcinoma. Distal Margin: Negative for adenocarcinoma. Treatment Effect: N/A Lymph-Vascular Invasion: Not identified. Perineural Invasion: Present. Additional findings: Chronic gastritis. Ancillary testing: Can be performed upon clinician request. 1 of 3 Duplicate copy FINAL for WANE, MOLLETT ([id number]) Microscopic Comment(continued) Lymph nodes: number examined - 37; number positive: 14 Pathologic Staging: pT4a, pN3a (JBK:gt, 04/27/17)      06/02/2017 -  Adjuvant Chemotherapy    FOLFOX every 2 weeks       HISTORY OF PRESENTING ILLNESS:  Gabriel Poole 72 y.o. male is here because of  newly diagnosed gastric cancer. Initially, he began having symptoms of bilateral abdominal pain, bloating, and decreased appetite in 07/2016 that persisted for 1.5 months before seeking care with PCP. He reports 40 pounds weight loss over 4-5 months. Denies nausea, vomiting, constipation, diarrhea, GI bleeding, or early satiety. Then referred to gastroenterologist in [place]. Work up for gall stones was negative. He had good performance status and working at [employer]. EGD by Dr. Laural Golden noted ulcerated mass seen on the greater curvature. CT [facility] showed known malignancy in the antral region of the stomach and a single lymph node adjacent to the duodenal bulb. He was referred to Dr. Ardis Hughs for EUS which confirmed gastric mass spanning 2.9 cm across and 7 mm deep suggesting invasion into and through the muscularis propria without evidence of invasion into nearby organs (uT3); lymph node on CT appeared to be reactive (uN0).     He was referred to Dr. Barry Dienes for surgery consult. CT chest completed staging work up which indicated two non-specific pulmonary nodules and a non-specific, hyperdense, possibly enhancing lesion along the dome of the liver. F/u MR abdomen revealed no definitive liver metastasis, the hyperenhancing lesion is thought to be flash filling hemangioma. He then underwent laparoscopic diagnostic ERAS pathway with partial gastrectomy and jejunal feeding tube placement on 04/25/17. Surgical pathology revealed poorly differentiated adenocarcinoma of the stomach, spanning 3.8cm with perineural invasion and with involvement of the serosa. 12/30 lymph nodes were positive for metastatic carcinoma with extracapsular extension. 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 One lymph node was also surveyed along the common hepatic artery which was negative for carcinoma. He was then referred to oncology for  further management.   In the past he was diagnosed with arthritis, heart murmur, HTN, hyperlipidemia, gout, hydrocele, and elevated PSA. Previous colonoscopy in 2015 was positive for sessile polyp but otherwise unremarkable and he is on 5-year follow up plan. He drinks one alcoholic drink daily. He smoked 0.5 PPD for 45 years, quit smoking before surgery 04/2017. Family history is positive for 2 maternal aunts diagnosed with leukemia. He is divorced and re-married, has 3 children who are healthy. He is accompanied by his ex-wife today.  Since his surgery he has recovered well. He uses J-tube occasionally but feeds make him nauseous; gets most nutrition and hydration by mouth. He last flushed tube 2 days ago, he has 8/10 left abdominal pain and is asking for refill of pain medication today, often increases to 8/10 at night. BMs are regular, no n/v. He is able to care for himself and complete ADLs independently. He does not drive, but has family who can transport him for appointments, he lives in [place], approx 30 minutes from [place].   CURRENT THERAPY: Adjuvant FOLFOX q2 weeks started on 06/02/17. Reduced oxaliplatin starting with cycle 4 due to cold sensitivity and neuropathy. Plan to stop oxaliplatin from cycle 5 due to neuropathy.  INTERVAL HISTORY: KEES IDROVO is here for follow up and cycle 5 of FOLFOX. He presents to the clinic by himself. He reports he has had a low grade fever of 99.6 onset 2 days ago. He reports sore throat, nasal congestion, cough and chills. He is able to eat and drink regularly.   On review of systems, pt denies diarrhea, abdominal pain, SOB or any other complaints at this time. Pertinent positives are listed and detailed within the above HPI.   REVIEW OF SYSTEMS:    Constitutional:  (+) low grade fever and chills Eyes: Denies blurriness of vision Ears, nose, mouth, throat, and face: (+) sore throat, nasal congestion Respiratory: Denies cough, dyspnea or  wheezes Cardiovascular: Denies palpitation, chest discomfort or lower extremity swelling Gastrointestinal:  Denies nausea, vomiting, constipation, diarrhea, heartburn or change in bowel habits (+) feeding tube, clamped Skin: Denies abnormal skin rashes Lymphatics: Denies new lymphadenopathy or easy bruising Neurological: Denies new weaknesses (+) cold sensitivity (+) pain in feet occasionally  Behavioral/Psych: Mood is stable, no new changes  All other systems were reviewed with the patient and are negative.   MEDICAL HISTORY:  Past Medical History:  Diagnosis Date  . Arthritis   . Cancer [facility]) dx oct 02-2017   stomach  . Gout   . Heart murmur   . Hyperlipidemia   . Hypertension   . Stomach cancer [facility])     SURGICAL HISTORY: Past Surgical History:  Procedure Laterality Date  . COLONOSCOPY    . ESOPHAGOGASTRODUODENOSCOPY N/A 02/17/2017   Procedure: ESOPHAGOGASTRODUODENOSCOPY (EGD);  Surgeon: Rogene Houston, MD;  [facility];  Service: Endoscopy;  Laterality: N/A;  3:00  . EUS N/A 03/11/2017   Procedure: UPPER ENDOSCOPIC ULTRASOUND (EUS) LINEAR;  Surgeon: Milus Banister, MD;  Location: [facility];  Service: Endoscopy;  Laterality: N/A;  . EUS N/A 03/11/2017   Procedure: UPPER ENDOSCOPIC ULTRASOUND (EUS) RADIAL;  Surgeon: Milus Banister, MD;  Location: [facility];  Service: Endoscopy;  Laterality: N/A;  . FINGER SURGERY     Lt middle   . GASTRECTOMY N/A 04/22/2017   Procedure: PARTIAL GASTRECTOMY;  Surgeon: Stark Klein, MD;  Location: [place];  Service: General;  Laterality: N/A;  . GASTROJEJUNOSTOMY N/A 04/22/2017   Procedure:  JEJUNAL FEEDING TUBE PLACEMENT;  Surgeon: Stark Klein, MD;  Location: [place];  Service: General;  Laterality: N/A;  . HYDROCELE EXCISION  02/12/2011   Procedure: HYDROCELECTOMY ADULT;  Surgeon: Marissa Nestle;  Location: [facility];  Service: Urology;  Laterality: Left;  . LAPAROSCOPY N/A 04/22/2017   Procedure: LAPAROSCOPY DIAGNOSTIC  ERAS PATHWAY;  Surgeon: Stark Klein, MD;  Location: [place];  Service: General;  Laterality: N/A;  EPIDURAL  . PORTACATH PLACEMENT N/A 05/28/2017   Procedure: INSERTION PORT-A-CATH ERAS PATHWAY;  Surgeon: Stark Klein, MD;  Location: [place];  Service: General;  Laterality: N/A;    I have reviewed the social history and family history with the patient and they are unchanged from previous note.  ALLERGIES:  is allergic to penicillins.  MEDICATIONS:  Current Outpatient Medications  Medication Sig Dispense Refill  . amLODipine (NORVASC) 10 MG tablet Take 1 tablet (10 mg total) by mouth daily. 90 tablet 0  . aspirin EC 81 MG tablet Take 81 mg by mouth daily.    Marland Kitchen atorvastatin (LIPITOR) 40 MG tablet Take 1 tablet (40 mg total) by mouth daily at 6 PM. 90 tablet 0  . azithromycin (ZITHROMAX) 500 MG tablet Take 1 tablet (500 mg total) by mouth daily. 5 tablet 0  . chlorproMAZINE (THORAZINE) 25 MG tablet Take 1 tablet (25 mg total) by mouth 3 (three) times daily as needed for hiccoughs. 20 tablet 0  . Cholecalciferol (VITAMIN D3) 5000 units CAPS Take 5,000 Units by mouth daily.    Marland Kitchen dexamethasone (DECADRON) 4 MG tablet Take 2 tablets (8 mg total) by mouth daily. Start the day after chemotherapy for 2 days. Take with food. 30 tablet 1  . DULoxetine (CYMBALTA) 60 MG capsule Take 1 capsule (60 mg total) by mouth daily. 30 capsule 1  . gabapentin (NEURONTIN) 300 MG capsule Take 1 capsule (300 mg total) by mouth 2 (two) times daily. 60 capsule 1  . lidocaine-prilocaine (EMLA) cream Apply to affected area once 30 g 3  . metoprolol tartrate (LOPRESSOR) 50 MG tablet Take 50 mg by mouth 2 (two) times daily.    Marland Kitchen omeprazole (PRILOSEC) 20 MG capsule Take 20 mg by mouth daily.    . ondansetron (ZOFRAN) 8 MG tablet Take 1 tablet (8 mg total) by mouth 2 (two) times daily as needed for refractory nausea / vomiting. Start on day 3 after chemotherapy. (Patient not taking: Reported on 06/29/2017) 30  tablet 1  . oxyCODONE (OXY IR/ROXICODONE) 5 MG immediate release tablet Take 1 tablet (5 mg total) by mouth every 12 (twelve) hours as needed for moderate pain, severe pain or breakthrough pain. 40 tablet 0  . pantoprazole (PROTONIX) 40 MG tablet Take 1 tablet (40 mg total) by mouth 2 (two) times daily before a meal. 60 tablet 4  . prochlorperazine (COMPAZINE) 10 MG tablet Take 1 tablet (10 mg total) by mouth every 6 (six) hours as needed (Nausea or vomiting). 30 tablet 1  . ranitidine (ZANTAC) 300 MG tablet Take 300 mg by mouth at bedtime.    . sucralfate (CARAFATE) 1 g tablet Take 1 tablet (1 g total) by mouth 2 (two) times daily. 60 tablet 0  . triamterene-hydrochlorothiazide (MAXZIDE-25) 37.5-25 MG tablet Take 1 tablet by mouth daily.     No current facility-administered medications for this visit.     PHYSICAL EXAMINATION: ECOG PERFORMANCE STATUS: 1 - Symptomatic but completely ambulatory Blood pressure 146/89, heart rate 70, respiratory rate 18, temperature 36.6, pulse ox 100%  on room air GENERAL:alert, no distress and comfortable SKIN: skin color, texture, turgor are normal, no rashes or significant lesions EYES: normal, Conjunctiva are pink and non-injected, sclera clear OROPHARYNX:no exudate, no erythema and lips, buccal mucosa, and tongue normal  NECK: supple, thyroid normal size, non-tender, without nodularity LYMPH:  no palpable cervical, supraclavicular, axillary, or inguinal lymphadenopathy LUNGS: clear to auscultation bilaterally with normal breathing effort HEART: regular rate & rhythm and no murmurs and no lower extremity edema ABDOMEN:abdomen soft, non-tender and normal bowel sounds. (+) vertical midline surgical incision is well healed (+) J tube to abdomen, clamped, site covered with gauze dressing c/d/i Musculoskeletal:no cyanosis of digits and no clubbing  NEURO: alert & oriented x 3 with fluent speech, no focal motor/sensory deficits PAC without erythema      LABORATORY DATA:  I have reviewed the data as listed CBC Latest Ref Rng & Units 07/14/2017 06/30/2017 06/16/2017  WBC 4.0 - 10.3 K/uL 5.4 3.2(L) 4.9  Hemoglobin 13.0 - 17.0 g/dL - - -  Hematocrit 38.4 - 49.9 % 38.8 35.6(L) 34.2(L)  Platelets 140 - 400 K/uL 186 185 240     CMP Latest Ref Rng & Units 07/14/2017 06/30/2017 06/16/2017  Glucose 70 - 140 mg/dL 94 86 122  BUN 7 - 26 mg/dL 14 11 11   Creatinine 0.70 - 1.30 mg/dL 1.22 1.16 1.01  Sodium 136 - 145 mmol/L 142 146(H) 143  Potassium 3.5 - 5.1 mmol/L 3.6 3.6 2.4(LL)  Chloride 98 - 109 mmol/L 105 109 108  CO2 22 - 29 mmol/L 28 29 26   Calcium 8.4 - 10.4 mg/dL 10.2 9.8 8.8  Total Protein 6.4 - 8.3 g/dL 6.8 6.7 5.4(L)  Total Bilirubin 0.2 - 1.2 mg/dL 0.9 0.8 0.5  Alkaline Phos 40 - 150 U/L 110 103 92  AST 5 - 34 U/L 15 19 10   ALT 0 - 55 U/L 15 21 12      PATHOLOGY  Diagnosis10/10/18 Stomach, biopsy, gastric ulcer - ADENOCARCINOMA. Microscopic Comment Several of the fragments are involved by moderately differentiated adenocarcinoma.   Diagnosis12/13/18 1. Liver, biopsy - BILE DUCT HAMARTOMA. - THERE IS NO EVIDENCE OF MALIGNANCY. 2. Lymph nodes, regional resection, portal - METASTATIC CARCINOMA IN 2 OF 6 LYMPH NODES (2/6). 3. Stomach, resection for tumor, distal - INVASIVE ADENOCARCINOMA, POORLY DIFFERENTIATED, SPANNING 3.8 CM. - PERINEURAL INVASION IS IDENTIFIED. - ADENOCARCINOMA INVOLVES THE SEROSA. - METASTATIC CARCINOMA IN 12 OF 30 LYMPH NODES (12/30), WITH EXTRACAPSULAR EXTENSION. - SEE ONCOLOGY TABLE BELOW. 4. Lymph node, biopsy, common hepatic artery - THERE IS NO EVIDENCE OF CARCINOMA IN 1 OF 1 LYMPH NODE (0/1). Microscopic Comment 3. STOMACH: Specimen: Stomach. Procedure: Partial gastrectomy. Tumor Site: Greater curvature. Tumor Size: 3.8 cm Histologic Type: Adenocarcinoma. Histologic Grade: G3: poorly differentiated. Microscopic Extent of Tumor: Adenocarcinoma involves the serosa. Margins (select all  that apply): Adenoca Proximal Margin: Negative for adenocarcinoma. Distal Margin: Negative for adenocarcinoma. Treatment Effect: N/A Lymph-Vascular Invasion: Not identified. Perineural Invasion: Present. Additional findings: Chronic gastritis. Ancillary testing: Can be performed upon clinician request. 1 of 3 Duplicate copy FINAL for KHAMARION, BJELLAND ([id number]) Microscopic Comment(continued) Lymph nodes: number examined - 37; number positive: 14 Pathologic Staging: pT4a, pN3a (JBK:gt, 04/27/17)     RADIOGRAPHIC STUDIES: I have personally reviewed the radiological images as listed and agreed with the findings in the report. No results found.   ASSESSMENT & PLAN: This is a wonderful72 y.o.malewho presents into the clinic today to discuss the following:   1. Primary adenocarcinoma of the pyloric antrum,  pT4a, PN3a, Grade 3, stage IIIB -We previously reviewed imaging and surgical pathology with the patient and family in detail  -He underwent partial gastrostomy and lymph node D2 dissection with clear margins. -Due to his very high risk of cancer recurrence, I recommend him to have adjuvant chemotherapy to reduce her risk of recurrence. -Started FOLFOX on 06/02/17.  -he is doing well, tolerating FOLFOX well. Will continue every 2 weeks, plan for a total of 12 cycles  -He developed significant cold sensitivity. I encouraged him to stay away from the cold and cold drinks and substances. He has very mild neuropathy in his feet.   -I previously recommend gabapentin, he tried 300 mg twice daily for week, and stopped due to the lack of efficacy.  I recommend him to increase to 600 mg at night, and continue 300 mg in the morning and early afternoon. -I previously prescribed Cymbalta but he did not want to try.  -I did reduce oxaliplatin dose on 07/14/17 with cycle 4 today due to his neuropathy.  -Held cycle 5 today due to URI symptoms. Will reschedule for next week.  -He still has  complaints of grade 1 intermittent peripheral neuropathy not improved of current dose of Neurontin. I advised him he can increase his dose. He still requests narcotic pain medication and I advised him again that I do not prescribe narcotics for neuropathy. I will discontinue Oxaliplatin to prevent worsening of his neuropathy.  -F/u in 3 weeks before cycle 6    2. Anemia  -Hgb 11.9 on 06/02/17, he will begin oral iron supplement -Hg improved to 12.2 on 3/6. Iron study mostly normal. Low sat ratio at 31. Continue oral iron  3.  Cold sensitivity and mild peripheral neuropathy G1, body pain  -His cold sensitivity has gotten worse, he also developed some peripheral neuropathy, intermittent.  -I recommended him to try gabapentin, and increase dose -We will not refill his narcotics.  We discussed narcotics addiction.  Patient has been asking for narcotics repeatedly, but he does not appear to be in distress or pain, and his peripheral vibration sensation test was only mildly diminished. -I previously prescribed Cymbalta but he did not want to try.  -I advised him he can increase his dose of Neurontin to 600 mg at bedtime, and 300 mg in the morning and early afternoon -Discontinued Oxaliplatin as of 07/28/17   6. URI symptoms  -In clinic today 07/28/17 he presents with cough, nasal congestion, and low grade fever -Will hold chemo today, reschedule for next week -Labs today and influenza nasal swab today -prescribed Erythromycin, take daily for 5 days    PLAN -Held Cycle 5 FOLFOX today due to low grade fever and URI symptoms, reschedule for next week  -Discontinue Oxaliplatin from next cycle  -Labs today, influenza nasal swab today -prescribed Erythromycin, take daily for 5 days  -increase Neurontin for his neuropathy, I would not refill his narcotics.   Orders Placed This Encounter  Procedures  . Influenza panel by PCR (type A & B)    Standing Status:   Future    Number of Occurrences:   1     Standing Expiration Date:   07/29/2018   All questions were answered. The patient knows to call the clinic with any problems, questions or concerns. No barriers to learning was detected.  I spent 20 minutes counseling the patient face to face. The total time spent in the appointment was 25 minutes and more than 50% was on counseling and  review of test results  This document serves as a record of services personally performed by Truitt Merle, MD. It was created on her behalf by Theresia Bough, a trained medical scribe. The creation of this record is based on the scribe's personal observations and the provider's statements to them.   I have reviewed the above documentation for accuracy and completeness, and I agree with the above.     Truitt Merle, MD 07/28/17 5:40 PM

## 2017-07-31 DIAGNOSIS — C162 Malignant neoplasm of body of stomach: Secondary | ICD-10-CM | POA: Diagnosis not present

## 2017-08-04 ENCOUNTER — Inpatient Hospital Stay: Payer: Medicare HMO

## 2017-08-04 ENCOUNTER — Telehealth: Payer: Self-pay | Admitting: Hematology

## 2017-08-04 ENCOUNTER — Other Ambulatory Visit: Payer: Medicare HMO

## 2017-08-04 VITALS — BP 137/88 | HR 70 | Temp 98.0°F | Resp 16

## 2017-08-04 DIAGNOSIS — Z931 Gastrostomy status: Secondary | ICD-10-CM | POA: Diagnosis not present

## 2017-08-04 DIAGNOSIS — C163 Malignant neoplasm of pyloric antrum: Secondary | ICD-10-CM

## 2017-08-04 DIAGNOSIS — E876 Hypokalemia: Secondary | ICD-10-CM | POA: Diagnosis not present

## 2017-08-04 DIAGNOSIS — D649 Anemia, unspecified: Secondary | ICD-10-CM | POA: Diagnosis not present

## 2017-08-04 DIAGNOSIS — C162 Malignant neoplasm of body of stomach: Secondary | ICD-10-CM | POA: Diagnosis not present

## 2017-08-04 DIAGNOSIS — J069 Acute upper respiratory infection, unspecified: Secondary | ICD-10-CM | POA: Diagnosis not present

## 2017-08-04 DIAGNOSIS — Z5111 Encounter for antineoplastic chemotherapy: Secondary | ICD-10-CM | POA: Diagnosis not present

## 2017-08-04 DIAGNOSIS — Z79899 Other long term (current) drug therapy: Secondary | ICD-10-CM | POA: Diagnosis not present

## 2017-08-04 LAB — CBC WITH DIFFERENTIAL (CANCER CENTER ONLY)
BASOS PCT: 1 %
Basophils Absolute: 0 10*3/uL (ref 0.0–0.1)
EOS PCT: 3 %
Eosinophils Absolute: 0.1 10*3/uL (ref 0.0–0.5)
HEMATOCRIT: 37.5 % — AB (ref 38.4–49.9)
Hemoglobin: 11.8 g/dL — ABNORMAL LOW (ref 13.0–17.1)
Lymphocytes Relative: 42 %
Lymphs Abs: 1.9 10*3/uL (ref 0.9–3.3)
MCH: 28.7 pg (ref 27.2–33.4)
MCHC: 31.5 g/dL — ABNORMAL LOW (ref 32.0–36.0)
MCV: 91.2 fL (ref 79.3–98.0)
MONO ABS: 0.4 10*3/uL (ref 0.1–0.9)
MONOS PCT: 9 %
NEUTROS ABS: 2 10*3/uL (ref 1.5–6.5)
Neutrophils Relative %: 45 %
PLATELETS: 198 10*3/uL (ref 140–400)
RBC: 4.11 MIL/uL — ABNORMAL LOW (ref 4.20–5.82)
RDW: 16 % — AB (ref 11.0–14.6)
WBC Count: 4.4 10*3/uL (ref 4.0–10.3)

## 2017-08-04 LAB — CMP (CANCER CENTER ONLY)
ALBUMIN: 3.5 g/dL (ref 3.5–5.0)
ALK PHOS: 94 U/L (ref 40–150)
ALT: 22 U/L (ref 0–55)
ANION GAP: 9 (ref 3–11)
AST: 20 U/L (ref 5–34)
BILIRUBIN TOTAL: 0.6 mg/dL (ref 0.2–1.2)
BUN: 11 mg/dL (ref 7–26)
CALCIUM: 9.6 mg/dL (ref 8.4–10.4)
CO2: 25 mmol/L (ref 22–29)
CREATININE: 0.96 mg/dL (ref 0.70–1.30)
Chloride: 110 mmol/L — ABNORMAL HIGH (ref 98–109)
GFR, Est AFR Am: >60 mL/min (ref >60)
GFR, Estimated: >60 mL/min (ref >60)
GLUCOSE: 118 mg/dL (ref 70–140)
Potassium: 3.1 mmol/L — ABNORMAL LOW (ref 3.5–5.1)
SODIUM: 144 mmol/L (ref 136–145)
TOTAL PROTEIN: 6.4 g/dL (ref 6.4–8.3)

## 2017-08-04 MED ORDER — LEUCOVORIN CALCIUM INJECTION 350 MG
400.0000 mg/m2 | Freq: Once | INTRAVENOUS | Status: AC
  Administered 2017-08-04: 744 mg via INTRAVENOUS
  Filled 2017-08-04: qty 37.2

## 2017-08-04 MED ORDER — DEXTROSE 5 % IV SOLN
Freq: Once | INTRAVENOUS | Status: AC
  Administered 2017-08-04: 11:00:00 via INTRAVENOUS

## 2017-08-04 MED ORDER — SODIUM CHLORIDE 0.9 % IV SOLN
2400.0000 mg/m2 | INTRAVENOUS | Status: DC
  Administered 2017-08-04: 4450 mg via INTRAVENOUS
  Filled 2017-08-04: qty 89

## 2017-08-04 MED ORDER — ALPRAZOLAM 0.5 MG PO TABS
1.0000 mg | ORAL_TABLET | Freq: Once | ORAL | Status: AC
  Administered 2017-08-04: 1 mg via ORAL

## 2017-08-04 MED ORDER — ALPRAZOLAM 0.5 MG PO TABS
ORAL_TABLET | ORAL | Status: AC
  Filled 2017-08-04: qty 2

## 2017-08-04 NOTE — Progress Notes (Signed)
Per Dr. Burr Medico pt will not receive Oxaliplatin today. Arbie Cookey in pharmacy updated on plan. Will continue to monitor

## 2017-08-04 NOTE — Telephone Encounter (Signed)
Left message on voicemail for patient regarding [facility] appointment time change per 3/27 sch msg

## 2017-08-04 NOTE — Patient Instructions (Signed)
[facility] Discharge Instructions for Patients Receiving Chemotherapy  Today you received the following chemotherapy agents: Leucovorin and Fluorouracil (Adrucil, 5-FU)  To help prevent nausea and vomiting after your treatment, we encourage you to take your nausea medication as prescribed.    If you develop nausea and vomiting that is not controlled by your nausea medication, call the clinic.   BELOW ARE SYMPTOMS THAT SHOULD BE REPORTED IMMEDIATELY:  *FEVER GREATER THAN 100.5 F  *CHILLS WITH OR WITHOUT FEVER  NAUSEA AND VOMITING THAT IS NOT CONTROLLED WITH YOUR NAUSEA MEDICATION  *UNUSUAL SHORTNESS OF BREATH  *UNUSUAL BRUISING OR BLEEDING  TENDERNESS IN MOUTH AND THROAT WITH OR WITHOUT PRESENCE OF ULCERS  *URINARY PROBLEMS  *BOWEL PROBLEMS  UNUSUAL RASH Items with * indicate a potential emergency and should be followed up as soon as possible.  Feel free to call the clinic should you have any questions or concerns. The clinic phone number is [phone].  Please show the CHEMO ALERT CARD at check-in to the Emergency Department and triage nurse.   

## 2017-08-06 ENCOUNTER — Inpatient Hospital Stay (HOSPITAL_BASED_OUTPATIENT_CLINIC_OR_DEPARTMENT_OTHER): Payer: Medicare HMO

## 2017-08-06 VITALS — BP 138/78 | HR 72 | Temp 98.1°F | Resp 18

## 2017-08-06 DIAGNOSIS — Z5111 Encounter for antineoplastic chemotherapy: Secondary | ICD-10-CM | POA: Diagnosis not present

## 2017-08-06 DIAGNOSIS — D649 Anemia, unspecified: Secondary | ICD-10-CM | POA: Diagnosis not present

## 2017-08-06 DIAGNOSIS — Z79899 Other long term (current) drug therapy: Secondary | ICD-10-CM | POA: Diagnosis not present

## 2017-08-06 DIAGNOSIS — C162 Malignant neoplasm of body of stomach: Secondary | ICD-10-CM | POA: Diagnosis not present

## 2017-08-06 DIAGNOSIS — Z931 Gastrostomy status: Secondary | ICD-10-CM | POA: Diagnosis not present

## 2017-08-06 DIAGNOSIS — E876 Hypokalemia: Secondary | ICD-10-CM | POA: Diagnosis not present

## 2017-08-06 DIAGNOSIS — J069 Acute upper respiratory infection, unspecified: Secondary | ICD-10-CM | POA: Diagnosis not present

## 2017-08-06 MED ORDER — HEPARIN SOD (PORK) LOCK FLUSH 100 UNIT/ML IV SOLN
500.0000 [IU] | Freq: Once | INTRAVENOUS | Status: AC | PRN
  Administered 2017-08-06: 500 [IU]
  Filled 2017-08-06: qty 5

## 2017-08-06 MED ORDER — SODIUM CHLORIDE 0.9% FLUSH
10.0000 mL | INTRAVENOUS | Status: DC | PRN
  Administered 2017-08-06: 10 mL
  Filled 2017-08-06: qty 10

## 2017-08-11 ENCOUNTER — Ambulatory Visit: Payer: Medicare HMO

## 2017-08-11 ENCOUNTER — Other Ambulatory Visit: Payer: Medicare HMO

## 2017-08-12 ENCOUNTER — Encounter: Payer: Self-pay | Admitting: General Practice

## 2017-08-12 ENCOUNTER — Telehealth: Payer: Self-pay | Admitting: General Practice

## 2017-08-12 NOTE — Telephone Encounter (Signed)
[facility] CSW Progress Notes  Call from patient, "I have lost the number of the man who is supposed to pick me up for my chemo appointment next week."  Per patient, he has transportation through [employer] [phone]), gave patient contact information and also mailed brochure so he will have this information in print.  Edwyna Shell, LCSW Clinical Social Worker Phone:  [phone]

## 2017-08-12 NOTE — Progress Notes (Signed)
[name]  Patient called stating he needs help w transportation for upcoming appointments.  States he has been called by "someone about a ride but I have lost their name and number."  CSW called two options that had been given to patient:  [facility] and [facility].  Per Margy Clarks, patient has not requested help w transport from their agency.  Called [facility] - patient has not scheduled rides w them.  Gave [facility] details about patient's upcoming appointments on 4/19 and 4/24, they will reach out and schedule rides directly w patient.  CSW called patient, unable to reach on home #, left brief VM on mobile # requesting call back.    Edwyna Shell, LCSW Clinical Social Worker Phone:  [phone]

## 2017-08-13 ENCOUNTER — Telehealth: Payer: Self-pay | Admitting: General Practice

## 2017-08-13 NOTE — Telephone Encounter (Signed)
[place] CSW Progress Notes  Patient called this AM, "upset that he doesn't have a ride to upcoming chemo appointment." CSW called patient at [phone].  Noted this is different number than #s currently in facesheet/medical record.  CSW reminded patient that he has been given contact information for both [facility] and [facility] to arrange rides he needs.   Called [facility], provided new contact #, asked them to call him at that #.  Per [facility], they have already called once to arrange upcoming transportation needs, were unable to reach patient.  Provider will be reaching out to patient again today, patient also encouraged to call [facility] to arrange transport.  CSW is unable to directly schedule rides, patient MUST call agencies to arrange his rides.  Edwyna Shell, LCSW Clinical Social Worker Phone:  [phone]

## 2017-08-16 ENCOUNTER — Telehealth: Payer: Self-pay | Admitting: *Deleted

## 2017-08-16 NOTE — Telephone Encounter (Signed)
Pt called & left vm that he needed a ride with [facility] for appt on Wed & states that they want [place] to call them to verify. Returned call to pt & [facility] # is [phone]. Informed we will call in am since too late to reach this pm.

## 2017-08-17 ENCOUNTER — Telehealth: Payer: Self-pay | Admitting: *Deleted

## 2017-08-17 ENCOUNTER — Encounter: Payer: Self-pay | Admitting: *Deleted

## 2017-08-17 NOTE — Progress Notes (Signed)
[place] Work  Holiday representative received message from [employer] [facility] that they had made multiple attempts to contact patient regarding transportation, but have been unable to reach him.  CSW contacted patient on 4/8 and informed him that [facility] was attempting to contact him.  CSW provided [facility] phone number and encouraged patient to call.  CSW contacted patient home to follow up regarding transportation to his appointment on 4/10.  Patient stated he had contacted [facility] and they were still "looking" for a volunteer.  CSW contacted [facility] to confirm, and also put a request in for patients appointment on 4/12.  Patient also stated he contacted [employer] (transportation benefit through [employer]).  [employer] can only provide transportation with in 25 miles and patient stated he lives 26 miles away.   Patient stated he uses [facility] [facility] to his in county appointments.  CSW contacted [facility] to explore options for transportation.  They are only able to provide out of county transportation for patients with Medicaid.  CSW contact the [place] to request assistance through the volunteer transportation program.  CSW was able to confirm transportation with Duanne Limerick.  Duanne Limerick volunteer will provide transportation to appointment tomorrow.     Johnnye Lana, MSW, LCSW, OSW-C Clinical Social Worker [facility] [phone]

## 2017-08-17 NOTE — Telephone Encounter (Signed)
Attempted to call [facility] x 3 unsuccessfully to verify that pt needed transportation to appts on 08/18/17.  Spoke with pt and informed pt of same info.  Pt stated he might have ride arranged by Education officer, museum, and /or ride from a friend.  Pt stated he might not need [facility].

## 2017-08-17 NOTE — Progress Notes (Addendum)
[place]  [phone] [phone]  Clinic Follow up Note   Patient Care Team: Octavio Graves, DO as PCP - General Truitt Merle, MD as Consulting Physician (Hematology) Alla Feeling, NP as Nurse Practitioner (Nurse Practitioner) Stark Klein, MD as Consulting Physician (General Surgery) Rogene Houston, MD as Consulting Physician (Gastroenterology) Milus Banister, MD as Attending Physician (Gastroenterology) Shea Evans [name], LCSW as [facility] [employer] (Licensed Clinical Social Worker)   Date of Service:  08/18/2017   CHIEF COMPLAINTS:  Follow up gastric cancer   SUMMARY OF ONCOLOGIC HISTORY: Oncology History   Cancer Staging Malignant neoplasm of body of stomach ([place]) Staging form: Stomach, AJCC 8th Edition - Clinical stage from 03/11/2017: Stage IIB (cT3, cN0, cM0) - Unsigned - Pathologic stage from 04/22/2017: Stage IIIB (pT4a, pN3a, cM0) - Signed by Alla Feeling, NP on 05/17/2017       Malignant neoplasm of body of stomach ([place])   02/17/2017 Initial Diagnosis    Malignant neoplasm of body of stomach ([place])      02/17/2017 Pathology Results    Diagnosis Stomach, biopsy, gastric ulcer - ADENOCARCINOMA. Microscopic Comment Several of the fragments are involved by moderately differentiated adenocarcinoma.      02/17/2017 Procedure    UPPER ENDOSCOPY  FINDINGS - Normal esophagus. - Z-line irregular, 44 cm from the incisors. - Red blood in the gastric body and in the gastric antrum. - Large gastric ulcer. Biopsied. - Erythematous mucosa in the antrum. - Normal cardia, gastric fundus, gastric body and pylorus. - Normal duodenal bulb and second portion of the duodenum. Comment: Endoscopic appearence concerning for malignant ulcer.      03/11/2017 Procedure    UPPER EUS PER DR. Ardis Hughs Findings: 1. The esophagus was normal. 2. There was a 2-3cm ulcerated, malignant mass along the greater curvature  of the stomach, approximately mid-body. The mass was non-circumferential. 3. The duodenum was normal. Endosonographic Finding 1. The gastric mass above correlated with a hypoechoic and heterogenous non-circumferential mass that measured 2.9cm across, 33m deep. The endosonographic borders were poorly defined and there was clear sonographic evidence suggesting invasion into and through the muscularis propria layer without evident invasion into nearby organs (uT3). 2. The duodenal lymphnode described on recent CT scan appeared reactive by [place]. 3. No perigastric adenopathy (uN0) 4. Limited views of the liver, spleen, pancreas, bile duct, gallbladder were all normal. - Along the greater curvature of the stomach, approximately mid-body, there is a 2.9cm uT3N0 (clinical stage IIB) gastric adenocarcinoma.      04/06/2017 Imaging    CT CHEST IMPRESSION: 1. No acute cardiopulmonary abnormalities. 2. Two small pulmonary nodules are noted measuring up to 5 mm. Nonspecific but warrant attention on follow-up imaging follow up 3. Nonspecific hyperdense, possibly enhancing lesion along the dome of liver is identified. In a patient that is at increased risk for more definitive assessment of this structure with contrast enhanced MRI of the liver is advised.      04/21/2017 Imaging    MR ABDOMEN IMPRESSION: 1. Enhancing 2.9 cm mass along the lesser curvature in the gastric antrum, compatible with known gastric malignancy . 2. Mildly enlarged gastrohepatic ligament lymph node, cannot exclude nodal metastasis. 3. No definite liver metastatic disease. Hyperenhancing 0.9 cm liver dome mass remains inconclusive, although the MRI features are most suggestive of a flash filling hemangioma, and the mass has been stable for nearly 2 months. Additional subcentimeter focus of hyperenhancement in the lateral segment left liver  lobe is most likely a benign transient vascular phenomenon. Recommend  attention to these lesions on a follow-up MRI abdomen without and with IV contrast in 3-6 months. This recommendation follows ACR consensus guidelines: Management of Incidental Liver Lesions on CT: A White Paper of the ACR Incidental Findings Committee. J Am Coll Radiol 2017; 12:7517-0017. 4. Benign right adrenal adenoma.        04/22/2017 Pathology Results    Diagnosis 1. Liver, biopsy - BILE DUCT HAMARTOMA. - THERE IS NO EVIDENCE OF MALIGNANCY. 2. Lymph nodes, regional resection, portal - METASTATIC CARCINOMA IN 2 OF 6 LYMPH NODES (2/6). 3. Stomach, resection for tumor, distal - INVASIVE ADENOCARCINOMA, POORLY DIFFERENTIATED, SPANNING 3.8 CM. - PERINEURAL INVASION IS IDENTIFIED. - ADENOCARCINOMA INVOLVES THE SEROSA. - METASTATIC CARCINOMA IN 12 OF 30 LYMPH NODES (12/30), WITH EXTRACAPSULAR EXTENSION. - SEE ONCOLOGY TABLE BELOW. 4. Lymph node, biopsy, common hepatic artery - THERE IS NO EVIDENCE OF CARCINOMA IN 1 OF 1 LYMPH NODE (0/1). Microscopic Comment 3. STOMACH: Specimen: Stomach. Procedure: Partial gastrectomy. Tumor Site: Greater curvature. Tumor Size: 3.8 cm Histologic Type: Adenocarcinoma. Histologic Grade: G3: poorly differentiated. Microscopic Extent of Tumor: Adenocarcinoma involves the serosa. Margins (select all that apply): Adenoca Proximal Margin: Negative for adenocarcinoma. Distal Margin: Negative for adenocarcinoma. Treatment Effect: N/A Lymph-Vascular Invasion: Not identified. Perineural Invasion: Present. Additional findings: Chronic gastritis. Ancillary testing: Can be performed upon clinician request. 1 of 3 Duplicate copy FINAL for Gabriel, Poole ([id number]) Microscopic Comment(continued) Lymph nodes: number examined - 37; number positive: 14 Pathologic Staging: pT4a, pN3a (JBK:gt, 04/27/17)      06/02/2017 -  Adjuvant Chemotherapy    FOLFOX every 2 weeks       HISTORY OF PRESENTING ILLNESS:  Gabriel Poole 72 y.o. male is  here because of newly diagnosed gastric cancer. Initially, he began having symptoms of bilateral abdominal pain, bloating, and decreased appetite in 07/2016 that persisted for 1.5 months before seeking care with PCP. He reports 40 pounds weight loss over 4-5 months. Denies nausea, vomiting, constipation, diarrhea, GI bleeding, or early satiety. Then referred to gastroenterologist in [place]. Work up for gall stones was negative. He had good performance status and working at [employer]. EGD by Dr. Laural Golden noted ulcerated mass seen on the greater curvature. CT [facility] showed known malignancy in the antral region of the stomach and a single lymph node adjacent to the duodenal bulb. He was referred to Dr. Ardis Hughs for EUS which confirmed gastric mass spanning 2.9 cm across and 7 mm deep suggesting invasion into and through the muscularis propria without evidence of invasion into nearby organs (uT3); lymph node on CT appeared to be reactive (uN0).     He was referred to Dr. Barry Dienes for surgery consult. CT chest completed staging work up which indicated two non-specific pulmonary nodules and a non-specific, hyperdense, possibly enhancing lesion along the dome of the liver. F/u MR abdomen revealed no definitive liver metastasis, the hyperenhancing lesion is thought to be flash filling hemangioma. He then underwent laparoscopic diagnostic ERAS pathway with partial gastrectomy and jejunal feeding tube placement on 04/25/17. Surgical pathology revealed poorly differentiated adenocarcinoma of the stomach, spanning 3.8cm with perineural invasion and with involvement of the serosa. 12/30 lymph nodes were positive for metastatic carcinoma with extracapsular extension. 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 One lymph node was also surveyed along the common hepatic artery which was negative for carcinoma. He was then referred  to oncology for further management.   In the past he was diagnosed with arthritis, heart murmur, HTN, hyperlipidemia, gout, hydrocele, and elevated PSA. Previous colonoscopy in 2015 was positive for sessile polyp but otherwise unremarkable and he is on 5-year follow up plan. He drinks one alcoholic drink daily. He smoked 0.5 PPD for 45 years, quit smoking before surgery 04/2017. Family history is positive for 2 maternal aunts diagnosed with leukemia. He is divorced and re-married, has 3 children who are healthy. He is accompanied by his ex-wife today.  Since his surgery he has recovered well. He uses J-tube occasionally but feeds make him nauseous; gets most nutrition and hydration by mouth. He last flushed tube 2 days ago, he has 8/10 left abdominal pain and is asking for refill of pain medication today, often increases to 8/10 at night. BMs are regular, no n/v. He is able to care for himself and complete ADLs independently. He does not drive, but has family who can transport him for appointments, he lives in [place], approx 30 minutes from [place].   CURRENT THERAPY: Adjuvant FOLFOX q2 weeks started on 06/02/17. Reduced oxaliplatin starting with cycle 4 due to cold sensitivity and neuropathy. Stopped oxaliplatin from cycle 5 due to neuropathy.  INTERVAL HISTORY: Gabriel Poole is here for follow up and cycle 6 chemotherapy. He presents to the clinic by himself. He reports he is fatigued due to lack of sleep. He states he takes Xanax each night for sleep and he does not have anymore. He states he is having 1-2 loose bowel movements per day. He reports he still has pain in his feet and he is continuing to use narcotic pain medication. He also states he wants to have his teeth pulled and get dentures.   On review of systems, pt denies any other complaints at this time. Pertinent positives are listed and detailed within the above HPI.   REVIEW OF SYSTEMS:    Constitutional: Denies fever or chills    Eyes: Denies blurriness of vision Ears, nose, mouth, throat, and face: Denies mucositis Respiratory: Denies cough, dyspnea or wheezes Cardiovascular: Denies palpitation, chest discomfort or lower extremity swelling Gastrointestinal:  Denies nausea, vomiting, constipation, diarrhea, heartburn or change in bowel habits (+) feeding tube, clamped Skin: Denies abnormal skin rashes Lymphatics: Denies new lymphadenopathy or easy bruising Neurological: Denies new weaknesses (+) cold sensitivity (+) pain in feet occasionally  Behavioral/Psych: Mood is stable, no new changes  All other systems were reviewed with the patient and are negative.  MEDICAL HISTORY:  Past Medical History:  Diagnosis Date  . Arthritis   . Cancer [facility]) dx oct 02-2017   stomach  . Gout   . Heart murmur   . Hyperlipidemia   . Hypertension   . Stomach cancer [facility])     SURGICAL HISTORY: Past Surgical History:  Procedure Laterality Date  . COLONOSCOPY    . ESOPHAGOGASTRODUODENOSCOPY N/A 02/17/2017   Procedure: ESOPHAGOGASTRODUODENOSCOPY (EGD);  Surgeon: Rogene Houston, MD;  [facility];  Service: Endoscopy;  Laterality: N/A;  3:00  . EUS N/A 03/11/2017   Procedure: UPPER ENDOSCOPIC ULTRASOUND (EUS) LINEAR;  Surgeon: Milus Banister, MD;  Location: [facility];  Service: Endoscopy;  Laterality: N/A;  . EUS N/A 03/11/2017   Procedure: UPPER ENDOSCOPIC ULTRASOUND (EUS) RADIAL;  Surgeon: Milus Banister, MD;  Location: [facility];  Service: Endoscopy;  Laterality: N/A;  . FINGER SURGERY     Lt middle   . GASTRECTOMY N/A 04/22/2017   Procedure:  PARTIAL GASTRECTOMY;  Surgeon: Stark Klein, MD;  Location: [place];  Service: General;  Laterality: N/A;  . GASTROJEJUNOSTOMY N/A 04/22/2017   Procedure: JEJUNAL FEEDING TUBE PLACEMENT;  Surgeon: Stark Klein, MD;  Location: [place];  Service: General;  Laterality: N/A;  . HYDROCELE EXCISION  02/12/2011   Procedure: HYDROCELECTOMY ADULT;  Surgeon: Marissa Nestle;  Location: [facility];  Service: Urology;  Laterality: Left;  . LAPAROSCOPY N/A 04/22/2017   Procedure: LAPAROSCOPY DIAGNOSTIC ERAS PATHWAY;  Surgeon: Stark Klein, MD;  Location: [place];  Service: General;  Laterality: N/A;  EPIDURAL  . PORTACATH PLACEMENT N/A 05/28/2017   Procedure: INSERTION PORT-A-CATH ERAS PATHWAY;  Surgeon: Stark Klein, MD;  Location: [place];  Service: General;  Laterality: N/A;    I have reviewed the social history and family history with the patient and they are unchanged from previous note.  ALLERGIES:  is allergic to penicillins.  MEDICATIONS:  Current Outpatient Medications  Medication Sig Dispense Refill  . amLODipine (NORVASC) 10 MG tablet Take 1 tablet (10 mg total) by mouth daily. 90 tablet 0  . aspirin EC 81 MG tablet Take 81 mg by mouth daily.    Marland Kitchen atorvastatin (LIPITOR) 40 MG tablet Take 1 tablet (40 mg total) by mouth daily at 6 PM. 90 tablet 0  . azithromycin (ZITHROMAX) 500 MG tablet Take 1 tablet (500 mg total) by mouth daily. 5 tablet 0  . chlorproMAZINE (THORAZINE) 25 MG tablet Take 1 tablet (25 mg total) by mouth 3 (three) times daily as needed for hiccoughs. 20 tablet 0  . Cholecalciferol (VITAMIN D3) 5000 units CAPS Take 5,000 Units by mouth daily.    Marland Kitchen lidocaine-prilocaine (EMLA) cream Apply to affected area once 30 g 3  . metoprolol tartrate (LOPRESSOR) 50 MG tablet Take 50 mg by mouth 2 (two) times daily.    Marland Kitchen omeprazole (PRILOSEC) 20 MG capsule Take 20 mg by mouth daily.    . ondansetron (ZOFRAN) 8 MG tablet Take 1 tablet (8 mg total) by mouth 2 (two) times daily as needed for refractory nausea / vomiting. Start on day 3 after chemotherapy. 30 tablet 1  . pantoprazole (PROTONIX) 40 MG tablet Take 1 tablet (40 mg total) by mouth 2 (two) times daily before a meal. 60 tablet 4  . prochlorperazine (COMPAZINE) 10 MG tablet Take 1 tablet (10 mg total) by mouth every 6 (six) hours as needed (Nausea or vomiting). 30 tablet 1  .  ranitidine (ZANTAC) 300 MG tablet Take 300 mg by mouth at bedtime.    . sucralfate (CARAFATE) 1 g tablet Take 1 tablet (1 g total) by mouth 2 (two) times daily. 60 tablet 0  . triamterene-hydrochlorothiazide (MAXZIDE-25) 37.5-25 MG tablet Take 1 tablet by mouth daily.    Marland Kitchen dexamethasone (DECADRON) 4 MG tablet Take 2 tablets (8 mg total) by mouth daily. Start the day after chemotherapy for 2 days. Take with food. (Patient not taking: Reported on 08/18/2017) 30 tablet 1  . potassium chloride SA (K-DUR,KLOR-CON) 20 MEQ tablet Take 1 tablet (20 mEq total) by mouth 2 (two) times daily. 40 tablet 1   No current facility-administered medications for this visit.     PHYSICAL EXAMINATION: ECOG PERFORMANCE STATUS: 1 - Symptomatic but completely ambulatory BP 109/64 (BP Location: Right Arm, Patient Position: Sitting)   Pulse 62   Temp 97.9 F (36.6 C) (Oral)   Resp 18   Ht _0  (1.778 m)   Wt 153 lb 8 oz (69.6 kg)  SpO2 100%   BMI 22.02 kg/m   GENERAL:alert, no distress and comfortable SKIN: skin color, texture, turgor are normal, no rashes or significant lesions EYES: normal, Conjunctiva are pink and non-injected, sclera clear OROPHARYNX:no exudate, no erythema and lips, buccal mucosa, and tongue normal  NECK: supple, thyroid normal size, non-tender, without nodularity LYMPH:  no palpable cervical, supraclavicular, axillary, or inguinal lymphadenopathy LUNGS: clear to auscultation bilaterally with normal breathing effort HEART: regular rate & rhythm and no murmurs and no lower extremity edema ABDOMEN:abdomen soft, non-tender and normal bowel sounds. (+) vertical midline surgical incision is well healed (+) J tube to abdomen, clamped, site covered with gauze dressing c/d/i Musculoskeletal:no cyanosis of digits and no clubbing  NEURO: alert & oriented x 3 with fluent speech, no focal motor/sensory deficits PAC without erythema     LABORATORY DATA:  I have reviewed the data as listed CBC  Latest Ref Rng & Units 09/01/2017 08/18/2017 08/04/2017  WBC 4.0 - 10.3 K/uL 5.1 4.8 4.4  Hemoglobin 13.0 - 17.1 g/dL 11.9(L) 11.4(L) 11.8(L)  Hematocrit 38.4 - 49.9 % 36.6(L) 34.8(L) 37.5(L)  Platelets 140 - 400 K/uL 229 189 198     CMP Latest Ref Rng & Units 08/18/2017 08/04/2017 07/14/2017  Glucose 70 - 140 mg/dL 112 118 94  BUN 7 - 26 mg/dL _0 Creatinine 0.70 - 1.30 mg/dL 1.01 0.96 1.22  Sodium 136 - 145 mmol/L 144 144 142  Potassium 3.5 - 5.1 mmol/L 2.9(LL) 3.1(L) 3.6  Chloride 98 - 109 mmol/L 109 110(H) 105  CO2 22 - 29 mmol/L _1 Calcium 8.4 - 10.4 mg/dL 9.5 9.6 10.2  Total Protein 6.4 - 8.3 g/dL 6.1(L) 6.4 6.8  Total Bilirubin 0.2 - 1.2 mg/dL 0.6 0.6 0.9  Alkaline Phos 40 - 150 U/L 96 94 110  AST 5 - 34 U/L _2 ALT 0 - 55 U/L _3 PATHOLOGY  Diagnosis10/10/18 Stomach, biopsy, gastric ulcer - ADENOCARCINOMA. Microscopic Comment Several of the fragments are involved by moderately differentiated adenocarcinoma.   Diagnosis12/13/18 1. Liver, biopsy - BILE DUCT HAMARTOMA. - THERE IS NO EVIDENCE OF MALIGNANCY. 2. Lymph nodes, regional resection, portal - METASTATIC CARCINOMA IN 2 OF 6 LYMPH NODES (2/6). 3. Stomach, resection for tumor, distal - INVASIVE ADENOCARCINOMA, POORLY DIFFERENTIATED, SPANNING 3.8 CM. - PERINEURAL INVASION IS IDENTIFIED. - ADENOCARCINOMA INVOLVES THE SEROSA. - METASTATIC CARCINOMA IN 12 OF 30 LYMPH NODES (12/30), WITH EXTRACAPSULAR EXTENSION. - SEE ONCOLOGY TABLE BELOW. 4. Lymph node, biopsy, common hepatic artery - THERE IS NO EVIDENCE OF CARCINOMA IN 1 OF 1 LYMPH NODE (0/1). Microscopic Comment 3. STOMACH: Specimen: Stomach. Procedure: Partial gastrectomy. Tumor Site: Greater curvature. Tumor Size: 3.8 cm Histologic Type: Adenocarcinoma. Histologic Grade: G3: poorly differentiated. Microscopic Extent of Tumor: Adenocarcinoma involves the serosa. Margins (select all that apply): Adenoca Proximal Margin:  Negative for adenocarcinoma. Distal Margin: Negative for adenocarcinoma. Treatment Effect: N/A Lymph-Vascular Invasion: Not identified. Perineural Invasion: Present. Additional findings: Chronic gastritis. Ancillary testing: Can be performed upon clinician request. 1 of 3 Duplicate copy FINAL for Gabriel Poole, Gabriel Poole ([id number]) Microscopic Comment(continued) Lymph nodes: number examined - 37; number positive: 14 Pathologic Staging: pT4a, pN3a (JBK:gt, 04/27/17)     RADIOGRAPHIC STUDIES: I have personally reviewed the radiological images as listed and agreed with the findings in the report. No results found.   ASSESSMENT & PLAN: This is a wonderful72 y.o.malewho presents into the clinic today to discuss the following:   1.  Primary adenocarcinoma of the pyloric antrum, pT4a, PN3aM0, stage IIIB, Grade 3, MSI-stable  -We previously reviewed imaging and surgical pathology with the patient and family in detail  -He underwent partial gastrostomy and lymph node D2 dissection with clear margins. -Due to his very high risk of cancer recurrence, I recommend him to have adjuvant chemotherapy to reduce his risk of recurrence. -Started adjuvant FOLFOX on 06/02/17.  -he is doing well, tolerating FOLFOX well. Will continue every 2 weeks, plan for a total of 12 cycles  -He developed significant cold sensitivity. I encouraged him to stay away from the cold and cold drinks and substances. He has also developed neuropathy in his feet.   -I previously recommend gabapentin, he tried 300 mg twice daily for a week, and stopped due to the lack of efficacy.  I recommend him to increase to 600 mg at night, and continue 300 mg in the morning and early afternoon, but his not compliant -I previously prescribed Cymbalta but he declined  -I did reduce oxaliplatin dose on 07/14/17 with cycle 4 due to his neuropathy. I discontinued Oxaliplatin to prevent worsening of his neuropathy on 07/28/17.  -Labs reviewed,  adequate for Cycle 6 of just 5-FU today -I had a lengthy discussion with pt about his treatment plan today. I discussed that 6 months is standard to maximally reduce his risk of recurrence.  If he cannot tolerate chemo or he request to discontinue, I will stop. Today he stated that he wishes to stop on 09/15/17.  Patient seems to have very poor health literacy, could not understand "risk of recurrence" even in layman's language, and has very poor shot tem memory. He has repeated asked why he needs chemo since he has no evidence of cancer now, low threshold to stop his chemo if he develops side effects. -F/u in 4 weeks  2. Anemia  -Hgb 11.9 on 06/02/17, he will begin oral iron supplement -Hg improved to 12.2 on 3/6. Iron study mostly normal. Low sat ratio at 31. Continue oral iron  3.  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  -I recommended him to try gabapentin, and increase dose -We will not refill his narcotics.  We discussed narcotics addiction.  Patient has been asking for narcotics repeatedly, but he does not appear to be in distress or pain, and his peripheral vibration sensation test was only mildly diminished. -I previously prescribed Cymbalta but he did not want to try.  -I advised him he can increase his dose of Neurontin to 600 mg at bedtime, and 300 mg in the morning and early afternoon -Discontinued Oxaliplatin as of 07/28/17.  His neuropathy has most resolved. -he has repeatedly ask narcotic pain medication for his pain in his feet that he has had prior to chemo. He previously received narcotics from his PCP Dr. Melina Copa. I offered him a referral to pain specialist and he declined. I will send a message to his PCP Dr. Melina Copa.  Per patient, he has been taking pain medication from his friends [place].  This is suspicious for narcotics addiction.  -I will not  prescribe any pain medication and xanax.   4. Insomnia  -he reports he has had trouble sleeping since starting chemo. He has been taking Xanax each night that he got from a friend.  -I advised him he can try melatonin or benadryl for sleep.    5. Hypokalemia  -  K is 2.9 today (08/18/17), I prescribed K supplement today and will give potassium today in infusion room    PLAN -proceed with Cycle 6 just 5-FU today, pt agreed to continue chemo every 2 weeks for 2 more cycles. But if he requests stopping chemo again, will stop.  -prescribed K supplement  -Lab and f/u in 4 weeks  -I will not prescribe the pain medication or benzo.  I will copy his primary care physician Dr. Melina Copa   No orders of the defined types were placed in this encounter.  All questions were answered. The patient knows to call the clinic with any problems, questions or concerns. No barriers to learning was detected.  I spent 20 minutes counseling the patient face to face. The total time spent in the appointment was 25 minutes and more than 50% was on counseling and review of test results  This document serves as a record of services personally performed by Truitt Merle, MD. It was created on her behalf by Theresia Bough, a trained medical scribe. The creation of this record is based on the scribe's personal observations and the provider's statements to them.   I have reviewed the above documentation for accuracy and completeness, and I agree with the above.    Truitt Merle, MD 08/18/17

## 2017-08-18 ENCOUNTER — Inpatient Hospital Stay: Payer: Medicare HMO

## 2017-08-18 ENCOUNTER — Encounter: Payer: Self-pay | Admitting: General Practice

## 2017-08-18 ENCOUNTER — Encounter: Payer: Self-pay | Admitting: Hematology

## 2017-08-18 ENCOUNTER — Inpatient Hospital Stay (HOSPITAL_BASED_OUTPATIENT_CLINIC_OR_DEPARTMENT_OTHER): Payer: Medicare HMO | Admitting: Hematology

## 2017-08-18 ENCOUNTER — Telehealth: Payer: Self-pay | Admitting: Hematology

## 2017-08-18 ENCOUNTER — Other Ambulatory Visit: Payer: Medicare HMO

## 2017-08-18 ENCOUNTER — Inpatient Hospital Stay: Payer: Medicare HMO | Attending: Nurse Practitioner

## 2017-08-18 VITALS — BP 109/64 | HR 62 | Temp 97.9°F | Resp 18 | Ht 70.0 in | Wt 153.5 lb

## 2017-08-18 DIAGNOSIS — K259 Gastric ulcer, unspecified as acute or chronic, without hemorrhage or perforation: Secondary | ICD-10-CM | POA: Insufficient documentation

## 2017-08-18 DIAGNOSIS — D649 Anemia, unspecified: Secondary | ICD-10-CM | POA: Diagnosis not present

## 2017-08-18 DIAGNOSIS — N433 Hydrocele, unspecified: Secondary | ICD-10-CM | POA: Insufficient documentation

## 2017-08-18 DIAGNOSIS — G4700 Insomnia, unspecified: Secondary | ICD-10-CM

## 2017-08-18 DIAGNOSIS — Z5111 Encounter for antineoplastic chemotherapy: Secondary | ICD-10-CM | POA: Diagnosis not present

## 2017-08-18 DIAGNOSIS — I10 Essential (primary) hypertension: Secondary | ICD-10-CM | POA: Insufficient documentation

## 2017-08-18 DIAGNOSIS — M109 Gout, unspecified: Secondary | ICD-10-CM | POA: Diagnosis not present

## 2017-08-18 DIAGNOSIS — Z87891 Personal history of nicotine dependence: Secondary | ICD-10-CM | POA: Insufficient documentation

## 2017-08-18 DIAGNOSIS — E876 Hypokalemia: Secondary | ICD-10-CM | POA: Diagnosis not present

## 2017-08-18 DIAGNOSIS — C162 Malignant neoplasm of body of stomach: Secondary | ICD-10-CM | POA: Insufficient documentation

## 2017-08-18 DIAGNOSIS — M1990 Unspecified osteoarthritis, unspecified site: Secondary | ICD-10-CM | POA: Insufficient documentation

## 2017-08-18 DIAGNOSIS — Z95828 Presence of other vascular implants and grafts: Secondary | ICD-10-CM

## 2017-08-18 DIAGNOSIS — Z79899 Other long term (current) drug therapy: Secondary | ICD-10-CM | POA: Diagnosis not present

## 2017-08-18 DIAGNOSIS — E785 Hyperlipidemia, unspecified: Secondary | ICD-10-CM | POA: Insufficient documentation

## 2017-08-18 DIAGNOSIS — Z7982 Long term (current) use of aspirin: Secondary | ICD-10-CM | POA: Diagnosis not present

## 2017-08-18 LAB — CBC WITH DIFFERENTIAL (CANCER CENTER ONLY)
Basophils Absolute: 0.1 10*3/uL (ref 0.0–0.1)
Basophils Relative: 1 %
EOS PCT: 2 %
Eosinophils Absolute: 0.1 10*3/uL (ref 0.0–0.5)
HCT: 34.8 % — ABNORMAL LOW (ref 38.4–49.9)
Hemoglobin: 11.4 g/dL — ABNORMAL LOW (ref 13.0–17.1)
LYMPHS ABS: 1.6 10*3/uL (ref 0.9–3.3)
Lymphocytes Relative: 34 %
MCH: 29.5 pg (ref 27.2–33.4)
MCHC: 32.7 g/dL (ref 32.0–36.0)
MCV: 90.3 fL (ref 79.3–98.0)
MONOS PCT: 7 %
Monocytes Absolute: 0.3 10*3/uL (ref 0.1–0.9)
Neutro Abs: 2.7 10*3/uL (ref 1.5–6.5)
Neutrophils Relative %: 56 %
PLATELETS: 189 10*3/uL (ref 140–400)
RBC: 3.85 MIL/uL — ABNORMAL LOW (ref 4.20–5.82)
RDW: 18 % — ABNORMAL HIGH (ref 11.0–14.6)
WBC: 4.8 10*3/uL (ref 4.0–10.3)

## 2017-08-18 LAB — CMP (CANCER CENTER ONLY)
ALT: 24 U/L (ref 0–55)
ANION GAP: 8 (ref 3–11)
AST: 19 U/L (ref 5–34)
Albumin: 3.4 g/dL — ABNORMAL LOW (ref 3.5–5.0)
Alkaline Phosphatase: 96 U/L (ref 40–150)
BUN: 10 mg/dL (ref 7–26)
CHLORIDE: 109 mmol/L (ref 98–109)
CO2: 27 mmol/L (ref 22–29)
Calcium: 9.5 mg/dL (ref 8.4–10.4)
Creatinine: 1.01 mg/dL (ref 0.70–1.30)
GFR, Est AFR Am: >60 mL/min (ref >60)
GFR, Estimated: >60 mL/min (ref >60)
Glucose, Bld: 112 mg/dL (ref 70–140)
Potassium: 2.9 mmol/L — CL (ref 3.5–5.1)
Sodium: 144 mmol/L (ref 136–145)
Total Bilirubin: 0.6 mg/dL (ref 0.2–1.2)
Total Protein: 6.1 g/dL — ABNORMAL LOW (ref 6.4–8.3)

## 2017-08-18 LAB — FERRITIN: Ferritin: 163 ng/mL (ref 22–316)

## 2017-08-18 LAB — IRON AND TIBC
Iron: 69 ug/dL (ref 42–163)
Saturation Ratios: 25 % — ABNORMAL LOW (ref 42–163)
TIBC: 270 ug/dL (ref 202–409)
UIBC: 201 ug/dL

## 2017-08-18 MED ORDER — PALONOSETRON HCL INJECTION 0.25 MG/5ML: 0.2500 mg | Freq: Once | INTRAVENOUS | Status: DC

## 2017-08-18 MED ORDER — POTASSIUM CHLORIDE CRYS ER 20 MEQ PO TBCR
40.0000 meq | EXTENDED_RELEASE_TABLET | Freq: Once | ORAL | Status: AC
  Administered 2017-08-18: 40 meq via ORAL

## 2017-08-18 MED ORDER — POTASSIUM CHLORIDE CRYS ER 20 MEQ PO TBCR: 40.0000 meq | EXTENDED_RELEASE_TABLET | Freq: Two times a day (BID) | ORAL | Status: DC

## 2017-08-18 MED ORDER — DEXTROSE 5 % IV SOLN
Freq: Once | INTRAVENOUS | Status: AC
  Administered 2017-08-18: 13:00:00 via INTRAVENOUS

## 2017-08-18 MED ORDER — POTASSIUM CHLORIDE CRYS ER 20 MEQ PO TBCR: 20.0000 meq | EXTENDED_RELEASE_TABLET | Freq: Two times a day (BID) | ORAL | 1 refills | Status: DC

## 2017-08-18 MED ORDER — DEXAMETHASONE SODIUM PHOSPHATE 10 MG/ML IJ SOLN: 10.0000 mg | Freq: Once | INTRAMUSCULAR | Status: DC

## 2017-08-18 MED ORDER — POTASSIUM CHLORIDE CRYS ER 20 MEQ PO TBCR
EXTENDED_RELEASE_TABLET | ORAL | Status: AC
  Filled 2017-08-18: qty 2

## 2017-08-18 MED ORDER — OXALIPLATIN CHEMO INJECTION 100 MG/20ML: 70.0000 mg/m2 | Freq: Once | INTRAVENOUS | Status: DC

## 2017-08-18 MED ORDER — FLUOROURACIL CHEMO INJECTION 5 GM/100ML
2400.0000 mg/m2 | INTRAVENOUS | Status: DC
  Administered 2017-08-18: 4450 mg via INTRAVENOUS
  Filled 2017-08-18: qty 89

## 2017-08-18 MED ORDER — DEXTROSE 5 % IV SOLN
400.0000 mg/m2 | Freq: Once | INTRAVENOUS | Status: AC
  Administered 2017-08-18: 744 mg via INTRAVENOUS
  Filled 2017-08-18: qty 37.2

## 2017-08-18 MED ORDER — SODIUM CHLORIDE 0.9% FLUSH
10.0000 mL | INTRAVENOUS | Status: DC | PRN
  Administered 2017-08-18: 10 mL via INTRAVENOUS
  Filled 2017-08-18: qty 10

## 2017-08-18 MED FILL — POTASSIUM CL ER 20 MEQ TABL: 20 | 35 days supply | Qty: 40 | Fill #0 | Status: TO

## 2017-08-18 NOTE — Telephone Encounter (Signed)
Appt scheduled letter/Calendar mailed to patient per 4/10 los

## 2017-08-18 NOTE — Patient Instructions (Signed)
[facility] Discharge Instructions for Patients Receiving Chemotherapy  Today you received the following chemotherapy agents: Leucovorin and Fluorouracil (Adrucil, 5-FU)  To help prevent nausea and vomiting after your treatment, we encourage you to take your nausea medication as prescribed.    If you develop nausea and vomiting that is not controlled by your nausea medication, call the clinic.   BELOW ARE SYMPTOMS THAT SHOULD BE REPORTED IMMEDIATELY:  *FEVER GREATER THAN 100.5 F  *CHILLS WITH OR WITHOUT FEVER  NAUSEA AND VOMITING THAT IS NOT CONTROLLED WITH YOUR NAUSEA MEDICATION  *UNUSUAL SHORTNESS OF BREATH  *UNUSUAL BRUISING OR BLEEDING  TENDERNESS IN MOUTH AND THROAT WITH OR WITHOUT PRESENCE OF ULCERS  *URINARY PROBLEMS  *BOWEL PROBLEMS  UNUSUAL RASH Items with * indicate a potential emergency and should be followed up as soon as possible.  Feel free to call the clinic should you have any questions or concerns. The clinic phone number is [phone].  Please show the CHEMO ALERT CARD at check-in to the Emergency Department and triage nurse.   

## 2017-08-18 NOTE — Progress Notes (Signed)
[place] CSW Progress Note  CSW received call from [facility] - they are transporting patient home from today's appointments.  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  CSW spoke w MD.  MD had clarified several times during today's appointment that patient needed ongoing chemotherapy and has several upcoming [place] appointments.  Duanne Limerick has scheduled volunteer Arbie Cookey to transport - please advise if any future appointments are cancelled by calling [phone] so volunteer drivers will be advised of any changes.  Edwyna Shell, LCSW Clinical Social Worker Phone:  [phone]

## 2017-08-19 ENCOUNTER — Encounter: Payer: Self-pay | Admitting: General Practice

## 2017-08-19 NOTE — Progress Notes (Signed)
[place] CSW Progress Notes  Call from [facility] - they are scheduled to transport patient to appt tomorrow.  Patient states his appt is at 12:30, Epic has him seen at 2:30.  Called patient, he is insistent that his pump runs out 12 - 12:30, therefore his appt is at 12:30.  Need to call Duanne Limerick to confirm correct appointment time, staff messaged scheduling and MD, left VM for desk nurse.    Edwyna Shell, LCSW Clinical Social Worker Phone:  [phone]

## 2017-08-20 ENCOUNTER — Inpatient Hospital Stay: Payer: Medicare HMO

## 2017-08-20 ENCOUNTER — Encounter: Payer: Self-pay | Admitting: General Practice

## 2017-08-20 VITALS — BP 138/78 | HR 68 | Temp 98.2°F | Resp 17

## 2017-08-20 DIAGNOSIS — Z5111 Encounter for antineoplastic chemotherapy: Secondary | ICD-10-CM | POA: Diagnosis not present

## 2017-08-20 DIAGNOSIS — E876 Hypokalemia: Secondary | ICD-10-CM | POA: Diagnosis not present

## 2017-08-20 DIAGNOSIS — I10 Essential (primary) hypertension: Secondary | ICD-10-CM | POA: Diagnosis not present

## 2017-08-20 DIAGNOSIS — D649 Anemia, unspecified: Secondary | ICD-10-CM | POA: Diagnosis not present

## 2017-08-20 DIAGNOSIS — E785 Hyperlipidemia, unspecified: Secondary | ICD-10-CM | POA: Diagnosis not present

## 2017-08-20 DIAGNOSIS — C162 Malignant neoplasm of body of stomach: Secondary | ICD-10-CM | POA: Diagnosis not present

## 2017-08-20 DIAGNOSIS — G4700 Insomnia, unspecified: Secondary | ICD-10-CM | POA: Diagnosis not present

## 2017-08-20 DIAGNOSIS — M109 Gout, unspecified: Secondary | ICD-10-CM | POA: Diagnosis not present

## 2017-08-20 DIAGNOSIS — K259 Gastric ulcer, unspecified as acute or chronic, without hemorrhage or perforation: Secondary | ICD-10-CM | POA: Diagnosis not present

## 2017-08-20 MED ORDER — SODIUM CHLORIDE 0.9% FLUSH
10.0000 mL | INTRAVENOUS | Status: DC | PRN
  Administered 2017-08-20: 10 mL
  Filled 2017-08-20: qty 10

## 2017-08-20 MED ORDER — HEPARIN SOD (PORK) LOCK FLUSH 100 UNIT/ML IV SOLN
500.0000 [IU] | Freq: Once | INTRAVENOUS | Status: AC | PRN
  Administered 2017-08-20: 500 [IU]
  Filled 2017-08-20: qty 5

## 2017-08-20 NOTE — Progress Notes (Signed)
[facility] CSW Progress Notes  CSW has made several calls to numbers available to contact patient to confirm transportation arrangements for today's appointment.  [place], agency that is supplying volunteer driver, has done the same.  No one has been able to reach patient.  Per Duanne Limerick, volunteer driver will arrive at his home approx 11:15 Am to transport to today's appointment.  Appointment time has been moved forward from 2:30 to 1:45.  Provider aware of time change - patient had requested this schedule adjustment in order to discontinue pump.  At this time, CSW has been unable to reach patient to confirm todays arrangements.  Patient is not eligible for [place] (will not transport non Medicaid clients out of county), [place] [facility] (no volunteers available in his area) - only option is [employer] who has limited Materials engineer coverage.    Edwyna Shell, LCSW Clinical Social Worker Phone:  [phone]

## 2017-08-24 ENCOUNTER — Other Ambulatory Visit: Payer: Self-pay | Admitting: *Deleted

## 2017-08-26 ENCOUNTER — Encounter: Payer: Self-pay | Admitting: General Practice

## 2017-08-26 NOTE — Progress Notes (Signed)
[place] CSW Progress Notes  Call from [place] at [employer] - they have arranged volunteer driver for patient's upcoming appointments at [facility] on 4/24 and 4/26.  CSW spoke w patient, advised him that Duanne Limerick will call him w detail, urged him to call Duanne Limerick if he has not heard from them re transport arrangements.  Edwyna Shell, LCSW Clinical Social Worker Phone:  [phone]

## 2017-08-27 ENCOUNTER — Other Ambulatory Visit: Payer: Self-pay | Admitting: Licensed Clinical Social Worker

## 2017-08-27 NOTE — Patient Outreach (Signed)
Assessment:  CSW spoke via phone with Gabriel Poole, CSW verified identity of Gabriel Poole. CSW received verbal permission from Gabriel Poole to speak with her about Gabriel Poole needs. Gabriel Poole continues to receive scheduled cancer treatments at [facility].  Gabriel Poole is using volunteer transport services at present to go to and from his scheduled cancer treatments. CSW has talked with Gabriel Poole and with Gabriel Poole about Gabriel Poole care plan and transport options to and from Gabriel Poole's scheduled treatments. CSW have encouraged Gabriel Poole or Gabriel Poole to talk with CSW in next 30 days about transport options for Gabriel Poole. Gabriel Poole said that Gabriel Poole has nearly completed his scheduled cancer treatments.CSW encouraged that Gabriel Poole or Gabriel Poole call CSW as needed to talk of Gabriel Poole social work needs. CSW thanked Gabriel Poole for call with CSW on 08/27/17.     Plan:  Gabriel Poole or Gabriel Poole to communicate with CSW in next 30 days to discuss transport options for Gabriel Poole to attend scheduled cancer treatments.  CSW to call Gabriel Poole in 2 weeks to assess Gabriel Poole needs.  [name].Larry Alcock MSW, LCSW Licensed Clinical Social Worker [facility] Care Management [phone]

## 2017-08-31 DIAGNOSIS — C162 Malignant neoplasm of body of stomach: Secondary | ICD-10-CM | POA: Diagnosis not present

## 2017-09-01 ENCOUNTER — Inpatient Hospital Stay: Payer: Medicare HMO

## 2017-09-01 ENCOUNTER — Other Ambulatory Visit: Payer: Self-pay | Admitting: Hematology

## 2017-09-01 VITALS — BP 126/79 | HR 68 | Temp 97.8°F | Resp 16 | Wt 153.5 lb

## 2017-09-01 DIAGNOSIS — E785 Hyperlipidemia, unspecified: Secondary | ICD-10-CM | POA: Diagnosis not present

## 2017-09-01 DIAGNOSIS — E876 Hypokalemia: Secondary | ICD-10-CM | POA: Diagnosis not present

## 2017-09-01 DIAGNOSIS — C162 Malignant neoplasm of body of stomach: Secondary | ICD-10-CM

## 2017-09-01 DIAGNOSIS — Z5111 Encounter for antineoplastic chemotherapy: Secondary | ICD-10-CM | POA: Diagnosis not present

## 2017-09-01 DIAGNOSIS — D649 Anemia, unspecified: Secondary | ICD-10-CM | POA: Diagnosis not present

## 2017-09-01 DIAGNOSIS — M109 Gout, unspecified: Secondary | ICD-10-CM | POA: Diagnosis not present

## 2017-09-01 DIAGNOSIS — Z95828 Presence of other vascular implants and grafts: Secondary | ICD-10-CM

## 2017-09-01 DIAGNOSIS — I10 Essential (primary) hypertension: Secondary | ICD-10-CM | POA: Diagnosis not present

## 2017-09-01 DIAGNOSIS — G4700 Insomnia, unspecified: Secondary | ICD-10-CM | POA: Diagnosis not present

## 2017-09-01 DIAGNOSIS — K259 Gastric ulcer, unspecified as acute or chronic, without hemorrhage or perforation: Secondary | ICD-10-CM | POA: Diagnosis not present

## 2017-09-01 LAB — CBC WITH DIFFERENTIAL (CANCER CENTER ONLY)
BASOS ABS: 0.1 10*3/uL (ref 0.0–0.1)
Basophils Relative: 1 %
Eosinophils Absolute: 0.1 10*3/uL (ref 0.0–0.5)
Eosinophils Relative: 2 %
HEMATOCRIT: 36.6 % — AB (ref 38.4–49.9)
HEMOGLOBIN: 11.9 g/dL — AB (ref 13.0–17.1)
LYMPHS ABS: 1.5 10*3/uL (ref 0.9–3.3)
LYMPHS PCT: 30 %
MCH: 29.8 pg (ref 27.2–33.4)
MCHC: 32.5 g/dL (ref 32.0–36.0)
MCV: 91.7 fL (ref 79.3–98.0)
Monocytes Absolute: 0.4 10*3/uL (ref 0.1–0.9)
Monocytes Relative: 9 %
NEUTROS ABS: 2.9 10*3/uL (ref 1.5–6.5)
NEUTROS PCT: 58 %
PLATELETS: 229 10*3/uL (ref 140–400)
RBC: 3.99 MIL/uL — AB (ref 4.20–5.82)
RDW: 17.5 % — ABNORMAL HIGH (ref 11.0–14.6)
WBC: 5.1 10*3/uL (ref 4.0–10.3)

## 2017-09-01 LAB — CMP (CANCER CENTER ONLY)
ALT: 25 U/L (ref 0–55)
ANION GAP: 10 (ref 3–11)
AST: 26 U/L (ref 5–34)
Albumin: 3.8 g/dL (ref 3.5–5.0)
Alkaline Phosphatase: 83 U/L (ref 40–150)
BILIRUBIN TOTAL: 0.6 mg/dL (ref 0.2–1.2)
BUN: 9 mg/dL (ref 7–26)
CHLORIDE: 109 mmol/L (ref 98–109)
CO2: 25 mmol/L (ref 22–29)
Calcium: 9.9 mg/dL (ref 8.4–10.4)
Creatinine: 1 mg/dL (ref 0.70–1.30)
GFR, Est AFR Am: >60 mL/min (ref >60)
Glucose, Bld: 78 mg/dL (ref 70–140)
POTASSIUM: 3.4 mmol/L — AB (ref 3.5–5.1)
Sodium: 144 mmol/L (ref 136–145)
TOTAL PROTEIN: 6.5 g/dL (ref 6.4–8.3)

## 2017-09-01 MED ORDER — PROCHLORPERAZINE MALEATE 10 MG PO TABS
ORAL_TABLET | ORAL | Status: AC
  Filled 2017-09-01: qty 1

## 2017-09-01 MED ORDER — HEPARIN SOD (PORK) LOCK FLUSH 100 UNIT/ML IV SOLN
500.0000 [IU] | Freq: Once | INTRAVENOUS | Status: DC | PRN
  Filled 2017-09-01: qty 5

## 2017-09-01 MED ORDER — SODIUM CHLORIDE 0.9% FLUSH
10.0000 mL | INTRAVENOUS | Status: DC | PRN
  Filled 2017-09-01: qty 10

## 2017-09-01 MED ORDER — SODIUM CHLORIDE 0.9 % IV SOLN
INTRAVENOUS | Status: DC
  Administered 2017-09-01: 12:00:00 via INTRAVENOUS

## 2017-09-01 MED ORDER — LEUCOVORIN CALCIUM INJECTION 350 MG
400.0000 mg/m2 | Freq: Once | INTRAMUSCULAR | Status: AC
  Administered 2017-09-01: 744 mg via INTRAVENOUS
  Filled 2017-09-01: qty 37.2

## 2017-09-01 MED ORDER — PROCHLORPERAZINE MALEATE 10 MG PO TABS
10.0000 mg | ORAL_TABLET | Freq: Four times a day (QID) | ORAL | Status: DC | PRN
  Administered 2017-09-01: 10 mg via ORAL

## 2017-09-01 MED ORDER — SODIUM CHLORIDE 0.9% FLUSH
10.0000 mL | INTRAVENOUS | Status: DC | PRN
  Administered 2017-09-01: 10 mL via INTRAVENOUS
  Filled 2017-09-01: qty 10

## 2017-09-01 MED ORDER — SODIUM CHLORIDE 0.9 % IV SOLN
2400.0000 mg/m2 | INTRAVENOUS | Status: DC
  Administered 2017-09-01: 4450 mg via INTRAVENOUS
  Filled 2017-09-01: qty 89

## 2017-09-01 NOTE — Patient Instructions (Signed)
[facility] Discharge Instructions for Patients Receiving Chemotherapy  Today you received the following chemotherapy agents: Leucovorin and Fluorouracil (Adrucil, 5-FU)  To help prevent nausea and vomiting after your treatment, we encourage you to take your nausea medication as prescribed.    If you develop nausea and vomiting that is not controlled by your nausea medication, call the clinic.   BELOW ARE SYMPTOMS THAT SHOULD BE REPORTED IMMEDIATELY:  *FEVER GREATER THAN 100.5 F  *CHILLS WITH OR WITHOUT FEVER  NAUSEA AND VOMITING THAT IS NOT CONTROLLED WITH YOUR NAUSEA MEDICATION  *UNUSUAL SHORTNESS OF BREATH  *UNUSUAL BRUISING OR BLEEDING  TENDERNESS IN MOUTH AND THROAT WITH OR WITHOUT PRESENCE OF ULCERS  *URINARY PROBLEMS  *BOWEL PROBLEMS  UNUSUAL RASH Items with * indicate a potential emergency and should be followed up as soon as possible.  Feel free to call the clinic should you have any questions or concerns. The clinic phone number is [phone].  Please show the CHEMO ALERT CARD at check-in to the Emergency Department and triage nurse.   

## 2017-09-02 ENCOUNTER — Encounter: Payer: Self-pay | Admitting: General Practice

## 2017-09-02 NOTE — Progress Notes (Signed)
[place] CSW Progress Notes  [place] in [place] will not transport patient after Friday 4/26.  Volunteer driver was scheduled to transport patient to chemo on 4/24, when volunteer "Jana Half" arrived at his home, another volunteer "Stanton Kidney" had already arrived and taken patient.  Duanne Limerick volunteer was not needed - agency cannot continue to schedule volunteers as there has been significant confusion re rides needed, times and availability of other resources to patient.  Patient will be informed by Duanne Limerick agency, [facility] CSW advised via staff message.  Edwyna Shell, LCSW Clinical Social Worker Phone:  [phone]

## 2017-09-03 ENCOUNTER — Inpatient Hospital Stay: Payer: Medicare HMO

## 2017-09-03 ENCOUNTER — Inpatient Hospital Stay (HOSPITAL_BASED_OUTPATIENT_CLINIC_OR_DEPARTMENT_OTHER): Payer: Medicare HMO

## 2017-09-03 ENCOUNTER — Other Ambulatory Visit: Payer: Self-pay | Admitting: Medical

## 2017-09-03 VITALS — BP 110/70 | HR 72 | Temp 97.9°F | Resp 18

## 2017-09-03 DIAGNOSIS — C162 Malignant neoplasm of body of stomach: Secondary | ICD-10-CM | POA: Diagnosis not present

## 2017-09-03 DIAGNOSIS — R5383 Other fatigue: Secondary | ICD-10-CM

## 2017-09-03 DIAGNOSIS — D649 Anemia, unspecified: Secondary | ICD-10-CM | POA: Diagnosis not present

## 2017-09-03 DIAGNOSIS — E785 Hyperlipidemia, unspecified: Secondary | ICD-10-CM | POA: Diagnosis not present

## 2017-09-03 DIAGNOSIS — I10 Essential (primary) hypertension: Secondary | ICD-10-CM | POA: Diagnosis not present

## 2017-09-03 DIAGNOSIS — E876 Hypokalemia: Secondary | ICD-10-CM | POA: Diagnosis not present

## 2017-09-03 DIAGNOSIS — Z5111 Encounter for antineoplastic chemotherapy: Secondary | ICD-10-CM | POA: Diagnosis not present

## 2017-09-03 DIAGNOSIS — K259 Gastric ulcer, unspecified as acute or chronic, without hemorrhage or perforation: Secondary | ICD-10-CM | POA: Diagnosis not present

## 2017-09-03 DIAGNOSIS — G4700 Insomnia, unspecified: Secondary | ICD-10-CM | POA: Diagnosis not present

## 2017-09-03 DIAGNOSIS — M109 Gout, unspecified: Secondary | ICD-10-CM | POA: Diagnosis not present

## 2017-09-03 LAB — VITAMIN B12: Vitamin B-12: 247 pg/mL (ref 180–914)

## 2017-09-03 MED ORDER — HEPARIN SOD (PORK) LOCK FLUSH 100 UNIT/ML IV SOLN
500.0000 [IU] | Freq: Once | INTRAVENOUS | Status: AC | PRN
  Administered 2017-09-03: 500 [IU]
  Filled 2017-09-03: qty 5

## 2017-09-03 MED ORDER — SODIUM CHLORIDE 0.9% FLUSH
10.0000 mL | INTRAVENOUS | Status: DC | PRN
  Administered 2017-09-03: 10 mL
  Filled 2017-09-03: qty 10

## 2017-09-08 ENCOUNTER — Other Ambulatory Visit: Payer: Self-pay | Admitting: Nurse Practitioner

## 2017-09-08 DIAGNOSIS — R066 Hiccough: Secondary | ICD-10-CM

## 2017-09-08 MED FILL — CHLORPROMAZINE 25 MG TABLET: 25 | 7 days supply | Qty: 20 | Fill #0

## 2017-09-09 ENCOUNTER — Other Ambulatory Visit: Payer: Self-pay | Admitting: Licensed Clinical Social Worker

## 2017-09-09 NOTE — Patient Outreach (Signed)
Assessment:  CSW spoke via phone with client. CSW verified client identity. CSW received verbal permission from client for CSW to speak with client about client needs.CSW spoke with client about client care plan. CSW encouraged that client communicate with CSW in the next 30 days to discuss transport options for client to attend scheduled cancer treatments.  Client said he has one cancer treatment remaining, scheduled for 09/15/17. He said he plans to call Gillie Manners, social worker at [facility], on 09/10/17 to talk with Deneise Lever about transport help to and from his 09/15/17 [place] appointment. CSW talked with client about church friends support for transport for him. CSW talked with client about [place] to Recovery program support. Client said he recently received transport help from [place] [facility] [place] encouraged client to access transport support services about which he had received information previously from [place] and from [place].  [place] thanked client for phone call with CSW on 09/09/17.    Plan:  Client to communicate with CSW in the next 30 days to discuss transport options for client to attend scheduled cancer treatments.   CSW to call client in 4 weeks to assess client needs.  [name].Niam Nepomuceno MSW, LCSW Licensed Clinical Social Worker [facility] Care Management [phone]

## 2017-09-10 ENCOUNTER — Ambulatory Visit: Payer: Self-pay | Admitting: Licensed Clinical Social Worker

## 2017-09-10 ENCOUNTER — Telehealth: Payer: Self-pay | Admitting: *Deleted

## 2017-09-10 NOTE — Telephone Encounter (Signed)
Received call from ex-wife, [name] asking to speak to  Dr Burr Medico.  She states that she has had a total knee replacement & didn't bring Fred to appts for a while & he is telling her that he is done with treatment & she is just wanting some information.  Message to Dr Burr Medico.  Call back # [phone]

## 2017-09-10 NOTE — Telephone Encounter (Signed)
I called back and spoke with her. She will try to come with pt next week for his last chemo.   Truitt Merle MD

## 2017-09-14 NOTE — Progress Notes (Signed)
Gabriel Poole  [phone] [phone]  Clinic Follow up Note   Patient Care Team: Octavio Graves, DO as PCP - General Truitt Merle, MD as Consulting Physician (Hematology) Alla Feeling, NP as Nurse Practitioner (Nurse Practitioner) Stark Klein, MD as Consulting Physician (General Surgery) Rogene Houston, MD as Consulting Physician (Gastroenterology) Milus Banister, MD as Attending Physician (Gastroenterology) Shea Evans [name], LCSW as 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 Microscopic Comment Several of the fragments are involved by moderately differentiated adenocarcinoma.      02/17/2017 Procedure    UPPER ENDOSCOPY  FINDINGS - Normal esophagus. - Z-line irregular, 44 cm from the incisors. - Red blood in the gastric body and in the gastric antrum. - Large gastric ulcer. Biopsied. - Erythematous mucosa in the antrum. - Normal cardia, gastric fundus, gastric body and pylorus. - Normal duodenal bulb and second portion of the duodenum. Comment: Endoscopic appearence concerning for malignant ulcer.      03/11/2017 Procedure    UPPER EUS PER DR. Ardis Hughs Findings: 1. The esophagus was normal. 2. There was a 2-3cm ulcerated, malignant mass along the greater curvature of the stomach, approximately mid-body. The mass was  non-circumferential. 3. The duodenum was normal. Endosonographic Finding 1. The gastric mass above correlated with a hypoechoic and heterogenous non-circumferential mass that measured 2.9cm across, 8m deep. The endosonographic borders were poorly defined and there was clear sonographic evidence suggesting invasion into and through the muscularis propria layer without evident invasion into nearby organs (uT3). 2. The duodenal lymphnode described on recent CT scan appeared reactive by [place]. 3. No perigastric adenopathy (uN0) 4. Limited views of the liver, spleen, pancreas, bile duct, gallbladder were all normal. - Along the greater curvature of the stomach, approximately mid-body, there is a 2.9cm uT3N0 (clinical stage IIB) gastric adenocarcinoma.      04/06/2017 Imaging    CT CHEST IMPRESSION: 1. No acute cardiopulmonary abnormalities. 2. Two small pulmonary nodules are noted measuring up to 5 mm. Nonspecific but warrant attention on follow-up imaging follow up 3. Nonspecific hyperdense, possibly enhancing lesion along the dome of liver is identified. In a patient that is at increased risk for more definitive assessment of this structure with contrast enhanced MRI of the liver is advised.      04/21/2017 Imaging    MR ABDOMEN IMPRESSION: 1. Enhancing 2.9 cm mass along the lesser curvature in the gastric antrum, compatible with known gastric malignancy . 2. Mildly enlarged gastrohepatic ligament lymph node, cannot exclude nodal metastasis. 3. No definite liver metastatic disease. Hyperenhancing 0.9 cm liver dome mass remains inconclusive, although the MRI features are most suggestive of a flash filling hemangioma, and the mass has been stable for nearly 2 months. Additional subcentimeter focus of hyperenhancement in the lateral segment left liver lobe is most likely a benign transient vascular phenomenon. Recommend attention to these lesions on a  follow-up MRI abdomen  without and with IV contrast in 3-6 months. This recommendation follows ACR consensus guidelines: Management of Incidental Liver Lesions on CT: A White Paper of the ACR Incidental Findings Committee. J Am Coll Radiol 2017; 14:9702-6378. 4. Benign right adrenal adenoma.        04/22/2017 Pathology Results    Diagnosis 1. Liver, biopsy - BILE DUCT HAMARTOMA. - THERE IS NO EVIDENCE OF MALIGNANCY. 2. Lymph nodes, regional resection, portal - METASTATIC CARCINOMA IN 2 OF 6 LYMPH NODES (2/6). 3. Stomach, resection for tumor, distal - INVASIVE ADENOCARCINOMA, POORLY DIFFERENTIATED, SPANNING 3.8 CM. - PERINEURAL INVASION IS IDENTIFIED. - ADENOCARCINOMA INVOLVES THE SEROSA. - METASTATIC CARCINOMA IN 12 OF 30 LYMPH NODES (12/30), WITH EXTRACAPSULAR EXTENSION. - SEE ONCOLOGY TABLE BELOW. 4. Lymph node, biopsy, common hepatic artery - THERE IS NO EVIDENCE OF CARCINOMA IN 1 OF 1 LYMPH NODE (0/1). Microscopic Comment 3. STOMACH: Specimen: Stomach. Procedure: Partial gastrectomy. Tumor Site: Greater curvature. Tumor Size: 3.8 cm Histologic Type: Adenocarcinoma. Histologic Grade: G3: poorly differentiated. Microscopic Extent of Tumor: Adenocarcinoma involves the serosa. Margins (select all that apply): Adenoca Proximal Margin: Negative for adenocarcinoma. Distal Margin: Negative for adenocarcinoma. Treatment Effect: N/A Lymph-Vascular Invasion: Not identified. Perineural Invasion: Present. Additional findings: Chronic gastritis. Ancillary testing: Can be performed upon clinician request. 1 of 3 Duplicate copy FINAL for KAIRO, LAUBACHER ([id number]) Microscopic Comment(continued) Lymph nodes: number examined - 37; number positive: 14 Pathologic Staging: pT4a, pN3a (JBK:gt, 04/27/17)      06/02/2017 -  Adjuvant Chemotherapy    FOLFOX every 2 weeks      CURRENT THERAPY: Adjuvant FOLFOX q2 weeks started on 06/02/17. Reduced oxaliplatin starting with cycle 4 due to cold sensitivity  and neuropathy. Stopped oxaliplatin from cycle 5 due to neuropathy.  INTERVAL HISTORY: Gabriel Poole returns for follow up and cycle 8 chemo as scheduled. He developed itching skin bumps last week on his back, arm, and groin. He has not changed detergent or soap. He uses his usual cologne. Has been outside lately but no known insect bites. He has not started new medication. He applied clorox to stop the itching. He does not want chemo today, would like to reschedule in 2 weeks.   REVIEW OF SYSTEMS:   Constitutional: Denies fevers, chills or abnormal weight loss (+) fatigue  Eyes: Denies blurriness of vision Ears, nose, mouth, throat, and face: Denies mucositis or sore throat Respiratory: Denies cough, dyspnea or wheezes Cardiovascular: Denies palpitation, chest discomfort or lower extremity swelling Gastrointestinal:  Denies nausea, vomiting, constipation, heartburn, abdominal pain, hematochezia or change in bowel habits (+) intermittent diarrhea  Skin: (+) itching skin bumps on back, buttock, groin, and arm Lymphatics: Denies new lymphadenopathy or easy bruising Neurological:Denies numbness, tingling or new weaknesses Behavioral/Psych: Mood is stable, no new changes  All other systems were reviewed with the patient and are negative.  MEDICAL HISTORY:  Past Medical History:  Diagnosis Date  . Arthritis   . Cancer [facility]) dx oct 02-2017   stomach  . Gout   . Heart murmur   . Hyperlipidemia   . Hypertension   . Stomach cancer [facility])     SURGICAL HISTORY: Past Surgical History:  Procedure Laterality Date  . COLONOSCOPY    . ESOPHAGOGASTRODUODENOSCOPY N/A 02/17/2017   Procedure: ESOPHAGOGASTRODUODENOSCOPY (EGD);  Surgeon: Rogene Houston, MD;  [facility];  Service: Endoscopy;  Laterality: N/A;  3:00  . EUS N/A 03/11/2017   Procedure: UPPER ENDOSCOPIC ULTRASOUND (EUS) LINEAR;  Surgeon: Milus Banister,  MD;  Location: [facility];  Service: Endoscopy;  Laterality: N/A;  .  EUS N/A 03/11/2017   Procedure: UPPER ENDOSCOPIC ULTRASOUND (EUS) RADIAL;  Surgeon: Milus Banister, MD;  Location: [facility];  Service: Endoscopy;  Laterality: N/A;  . FINGER SURGERY     Lt middle   . GASTRECTOMY N/A 04/22/2017   Procedure: PARTIAL GASTRECTOMY;  Surgeon: Stark Klein, MD;  Location: [place];  Service: General;  Laterality: N/A;  . GASTROJEJUNOSTOMY N/A 04/22/2017   Procedure: JEJUNAL FEEDING TUBE PLACEMENT;  Surgeon: Stark Klein, MD;  Location: [place];  Service: General;  Laterality: N/A;  . HYDROCELE EXCISION  02/12/2011   Procedure: HYDROCELECTOMY ADULT;  Surgeon: Marissa Nestle;  Location: [facility];  Service: Urology;  Laterality: Left;  . LAPAROSCOPY N/A 04/22/2017   Procedure: LAPAROSCOPY DIAGNOSTIC ERAS PATHWAY;  Surgeon: Stark Klein, MD;  Location: [place];  Service: General;  Laterality: N/A;  EPIDURAL  . PORTACATH PLACEMENT N/A 05/28/2017   Procedure: INSERTION PORT-A-CATH ERAS PATHWAY;  Surgeon: Stark Klein, MD;  Location: [place];  Service: General;  Laterality: N/A;    I have reviewed the social history and family history with the patient and they are unchanged from previous note.  ALLERGIES:  is allergic to penicillins.  MEDICATIONS:  Current Outpatient Medications  Medication Sig Dispense Refill  . amLODipine (NORVASC) 10 MG tablet Take 1 tablet (10 mg total) by mouth daily. 90 tablet 0  . aspirin EC 81 MG tablet Take 81 mg by mouth daily.    Marland Kitchen atorvastatin (LIPITOR) 40 MG tablet Take 1 tablet (40 mg total) by mouth daily at 6 PM. 90 tablet 0  . azithromycin (ZITHROMAX) 500 MG tablet Take 1 tablet (500 mg total) by mouth daily. 5 tablet 0  . chlorproMAZINE (THORAZINE) 25 MG tablet TAKE 1 TABLET BY MOUTH 3 TIMES DAILY AS NEEDED FOR HICCUPS 10 tablet 0  . Cholecalciferol (VITAMIN D3) 5000 units CAPS Take 5,000 Units by mouth daily.    Marland Kitchen dexamethasone (DECADRON) 4 MG tablet Take 2 tablets (8 mg total) by mouth daily. Start the day after  chemotherapy for 2 days. Take with food. 30 tablet 1  . lidocaine-prilocaine (EMLA) cream Apply to affected area once 30 g 3  . metoprolol tartrate (LOPRESSOR) 50 MG tablet Take 50 mg by mouth 2 (two) times daily.    Marland Kitchen omeprazole (PRILOSEC) 20 MG capsule Take 20 mg by mouth daily.    . ondansetron (ZOFRAN) 8 MG tablet Take 1 tablet (8 mg total) by mouth 2 (two) times daily as needed for refractory nausea / vomiting. Start on day 3 after chemotherapy. 30 tablet 1  . pantoprazole (PROTONIX) 40 MG tablet Take 1 tablet (40 mg total) by mouth 2 (two) times daily before a meal. 60 tablet 4  . potassium chloride SA (K-DUR,KLOR-CON) 20 MEQ tablet Take 1 tablet (20 mEq total) by mouth 2 (two) times daily. 40 tablet 1  . prochlorperazine (COMPAZINE) 10 MG tablet Take 1 tablet (10 mg total) by mouth every 6 (six) hours as needed (Nausea or vomiting). 30 tablet 1  . ranitidine (ZANTAC) 300 MG tablet Take 300 mg by mouth at bedtime.    . sucralfate (CARAFATE) 1 g tablet Take 1 tablet (1 g total) by mouth 2 (two) times daily. 60 tablet 0  . triamterene-hydrochlorothiazide (MAXZIDE-25) 37.5-25 MG tablet Take 1 tablet by mouth daily.     No current facility-administered medications for this visit.     PHYSICAL EXAMINATION: ECOG  PERFORMANCE STATUS: 1 - Symptomatic but completely ambulatory  Vitals:   09/15/17 1204  BP: 127/67  Pulse: (!) 54  Resp: 18  Temp: 97.9 F (36.6 C)  SpO2: 100%   Filed Weights   09/15/17 1204  Weight: 152 lb 6.4 oz (69.1 kg)    GENERAL:alert, no distress and comfortable SKIN: skin color, texture, turgor are normal (+) few scattered 0.5-1 cm skin lesions on back, posterior right arm, left low pelvis, and right hip  EYES: normal, Conjunctiva are pink and non-injected, sclera clear OROPHARYNX:no exudate, no erythema and lips, buccal mucosa, and tongue normal  LYMPH:  no palpable cervical or supraclavicular lymphadenopathy LUNGS: clear to auscultation with normal breathing  effort HEART: regular rate & rhythm and no murmurs and no lower extremity edema ABDOMEN:abdomen soft, non-tender and normal bowel sounds. (+) Vertical abdominal incision is well healed  Musculoskeletal:no cyanosis of digits and no clubbing  NEURO: alert & oriented x 3 with fluent speech, no focal motor/sensory deficits PAC without erythema   LABORATORY DATA:  I have reviewed the data as listed CBC Latest Ref Rng & Units 09/15/2017 09/01/2017 08/18/2017  WBC 4.0 - 10.3 K/uL 9.0 5.1 4.8  Hemoglobin 13.0 - 17.1 g/dL 12.5(L) 11.9(L) 11.4(L)  Hematocrit 38.4 - 49.9 % 38.5 36.6(L) 34.8(L)  Platelets 140 - 400 K/uL 213 229 189     CMP Latest Ref Rng & Units 09/15/2017 09/01/2017 08/18/2017  Glucose 70 - 140 mg/dL 118 78 112  BUN 7 - 26 mg/dL _0 Creatinine 0.70 - 1.30 mg/dL 1.04 1.00 1.01  Sodium 136 - 145 mmol/L 139 144 144  Potassium 3.5 - 5.1 mmol/L 3.4(L) 3.4(L) 2.9(LL)  Chloride 98 - 109 mmol/L 107 109 109  CO2 22 - 29 mmol/L _1 Calcium 8.4 - 10.4 mg/dL 9.7 9.9 9.5  Total Protein 6.4 - 8.3 g/dL 6.2(L) 6.5 6.1(L)  Total Bilirubin 0.2 - 1.2 mg/dL 0.5 0.6 0.6  Alkaline Phos 40 - 150 U/L 89 83 96  AST 5 - 34 U/L _2 ALT 0 - 55 U/L 39 25 24   PATHOLOGY  Diagnosis10/10/18 Stomach, biopsy, gastric ulcer - ADENOCARCINOMA. Microscopic Comment Several of the fragments are involved by moderately differentiated adenocarcinoma.   Diagnosis12/13/18 1. Liver, biopsy - BILE DUCT HAMARTOMA. - THERE IS NO EVIDENCE OF MALIGNANCY. 2. Lymph nodes, regional resection, portal - METASTATIC CARCINOMA IN 2 OF 6 LYMPH NODES (2/6). 3. Stomach, resection for tumor, distal - INVASIVE ADENOCARCINOMA, POORLY DIFFERENTIATED, SPANNING 3.8 CM. - PERINEURAL INVASION IS IDENTIFIED. - ADENOCARCINOMA INVOLVES THE SEROSA. - METASTATIC CARCINOMA IN 12 OF 30 LYMPH NODES (12/30), WITH EXTRACAPSULAR EXTENSION. - SEE ONCOLOGY TABLE BELOW. 4. Lymph node, biopsy, common hepatic artery - THERE IS  NO EVIDENCE OF CARCINOMA IN 1 OF 1 LYMPH NODE (0/1). Microscopic Comment 3. STOMACH: Specimen: Stomach. Procedure: Partial gastrectomy. Tumor Site: Greater curvature. Tumor Size: 3.8 cm Histologic Type: Adenocarcinoma. Histologic Grade: G3: poorly differentiated. Microscopic Extent of Tumor: Adenocarcinoma involves the serosa. Margins (select all that apply): Adenoca Proximal Margin: Negative for adenocarcinoma. Distal Margin: Negative for adenocarcinoma. Treatment Effect: N/A Lymph-Vascular Invasion: Not identified. Perineural Invasion: Present. Additional findings: Chronic gastritis. Ancillary testing: Can be performed upon clinician request. 1 of 3 Duplicate copy FINAL for Gabriel Poole, TARBET ([id number]) Microscopic Comment(continued) Lymph nodes: number examined - 37; number positive: 14 Pathologic Staging: pT4a, pN3a (JBK:gt, 04/27/17)    RADIOGRAPHIC STUDIES: I have personally reviewed the radiological images as listed and agreed with the  findings in the report. No results found.   ASSESSMENT & PLAN: [email or web address] presents into the clinic today to discuss the following:   1. Primary adenocarcinoma of the pyloric antrum, pT4a, PN3aM0, stage IIIB, Grade3, MSI-stable  -Mr. Dufford appears stable. He completed cycle 7 chemotherapy with 5FU/leucovorin on 09/01/17. He is tolerating well overall. He has fatigue and intermittent diarrhea. I recommend imodium per package instructions. VS and weight stable. CBC and CMP reviewed, adequate for treatment. He previously told Dr. Burr Medico this cycle would be his last chemotherapy, but he wants to delay for 2 weeks for skin lesions to heal. Will f/u with Dr. Burr Medico and last cycle in 2 weeks, will monitor skin.   2. Anemia -Anemia is slightly improved. Hgb 12.5; he takes oral iron daily. Ferritin and iron studies are adequate for now. Continue oral iron.   3. Cold sensitivity and mild peripheral neuropathy, G1; body and feet pain  -he  currently denies neuropathy or any pain   4. Insomnia  -he currently reports sleeping well without medication aide.   5. Hypokalemia  -previously prescribed 20 mEq 1 tab BID, he stopped taking. K is 3.4 today. He continues to have intermittent diarrhea, I recommend he stay on BID oral K supplement for now. Will monitor.   6. Unspecified skin eruption  -He developed pruritic scattered skin lesions last week. I implored him to stop putting bleach on the areas and instead apply benadryl for itching and alternate with hydrocortisone. This does not appear to be a typical drug rash, I do not think it is related to chemo. Will monitor closely.   7. Diarrhea  -I recommend imodium for intermittent diarrhea per package instructions. I encouraged him to increase water intake with times of increased stool output.   PLAN: -Labs reviewed -Hold chemo per patient request for skin eruption -Topical benadryl and hydrocortisone for skin eruption -Imodium for diarrhea -Refilled thorazine for hiccups, reviewed PRN dosing  -Restart oral K 1 tab BID -Lab, f/u with Dr. Burr Medico, 5FU/leucovorin in 2 weeks, likely patient's last chemo per previous discussions   All questions were answered. The patient knows to call the clinic with any problems, questions or concerns. No barriers to learning was detected. I spent 20 minutes counseling the patient face to face. The total time spent in the appointment was 25 minutes and more than 50% was on counseling and review of test results     Alla Feeling, NP 09/15/17

## 2017-09-15 ENCOUNTER — Inpatient Hospital Stay: Payer: Medicare HMO

## 2017-09-15 ENCOUNTER — Inpatient Hospital Stay (HOSPITAL_BASED_OUTPATIENT_CLINIC_OR_DEPARTMENT_OTHER): Payer: Medicare HMO | Admitting: Nurse Practitioner

## 2017-09-15 ENCOUNTER — Other Ambulatory Visit: Payer: Medicare HMO

## 2017-09-15 ENCOUNTER — Inpatient Hospital Stay: Payer: Medicare HMO | Attending: Nurse Practitioner

## 2017-09-15 ENCOUNTER — Encounter: Payer: Self-pay | Admitting: Nurse Practitioner

## 2017-09-15 VITALS — BP 127/67 | HR 54 | Temp 97.9°F | Resp 18 | Ht 70.0 in | Wt 152.4 lb

## 2017-09-15 DIAGNOSIS — R21 Rash and other nonspecific skin eruption: Secondary | ICD-10-CM | POA: Diagnosis not present

## 2017-09-15 DIAGNOSIS — I10 Essential (primary) hypertension: Secondary | ICD-10-CM | POA: Diagnosis not present

## 2017-09-15 DIAGNOSIS — Z79899 Other long term (current) drug therapy: Secondary | ICD-10-CM | POA: Insufficient documentation

## 2017-09-15 DIAGNOSIS — R066 Hiccough: Secondary | ICD-10-CM

## 2017-09-15 DIAGNOSIS — Z95828 Presence of other vascular implants and grafts: Secondary | ICD-10-CM

## 2017-09-15 DIAGNOSIS — F17200 Nicotine dependence, unspecified, uncomplicated: Secondary | ICD-10-CM | POA: Diagnosis not present

## 2017-09-15 DIAGNOSIS — E876 Hypokalemia: Secondary | ICD-10-CM | POA: Insufficient documentation

## 2017-09-15 DIAGNOSIS — D649 Anemia, unspecified: Secondary | ICD-10-CM | POA: Diagnosis not present

## 2017-09-15 DIAGNOSIS — C163 Malignant neoplasm of pyloric antrum: Secondary | ICD-10-CM | POA: Insufficient documentation

## 2017-09-15 DIAGNOSIS — E785 Hyperlipidemia, unspecified: Secondary | ICD-10-CM | POA: Diagnosis not present

## 2017-09-15 DIAGNOSIS — G629 Polyneuropathy, unspecified: Secondary | ICD-10-CM | POA: Insufficient documentation

## 2017-09-15 DIAGNOSIS — R197 Diarrhea, unspecified: Secondary | ICD-10-CM | POA: Diagnosis not present

## 2017-09-15 DIAGNOSIS — C162 Malignant neoplasm of body of stomach: Secondary | ICD-10-CM

## 2017-09-15 DIAGNOSIS — Z5111 Encounter for antineoplastic chemotherapy: Secondary | ICD-10-CM | POA: Insufficient documentation

## 2017-09-15 LAB — CMP (CANCER CENTER ONLY)
ALBUMIN: 3.7 g/dL (ref 3.5–5.0)
ALK PHOS: 89 U/L (ref 40–150)
ALT: 39 U/L (ref 0–55)
ANION GAP: 5 (ref 3–11)
AST: 23 U/L (ref 5–34)
BUN: 16 mg/dL (ref 7–26)
CO2: 27 mmol/L (ref 22–29)
Calcium: 9.7 mg/dL (ref 8.4–10.4)
Chloride: 107 mmol/L (ref 98–109)
Creatinine: 1.04 mg/dL (ref 0.70–1.30)
GFR, Est AFR Am: >60 mL/min (ref >60)
GFR, Estimated: >60 mL/min (ref >60)
GLUCOSE: 118 mg/dL (ref 70–140)
POTASSIUM: 3.4 mmol/L — AB (ref 3.5–5.1)
SODIUM: 139 mmol/L (ref 136–145)
Total Bilirubin: 0.5 mg/dL (ref 0.2–1.2)
Total Protein: 6.2 g/dL — ABNORMAL LOW (ref 6.4–8.3)

## 2017-09-15 LAB — CBC WITH DIFFERENTIAL (CANCER CENTER ONLY)
BASOS ABS: 0 10*3/uL (ref 0.0–0.1)
BASOS PCT: 0 %
EOS PCT: 2 %
Eosinophils Absolute: 0.1 10*3/uL (ref 0.0–0.5)
HEMATOCRIT: 38.5 % (ref 38.4–49.9)
Hemoglobin: 12.5 g/dL — ABNORMAL LOW (ref 13.0–17.1)
LYMPHS PCT: 25 %
Lymphs Abs: 2.2 10*3/uL (ref 0.9–3.3)
MCH: 30.6 pg (ref 27.2–33.4)
MCHC: 32.5 g/dL (ref 32.0–36.0)
MCV: 94.1 fL (ref 79.3–98.0)
MONO ABS: 0.7 10*3/uL (ref 0.1–0.9)
Monocytes Relative: 8 %
NEUTROS ABS: 5.9 10*3/uL (ref 1.5–6.5)
Neutrophils Relative %: 65 %
Platelet Count: 213 10*3/uL (ref 140–400)
RBC: 4.09 MIL/uL — ABNORMAL LOW (ref 4.20–5.82)
RDW: 16 % — ABNORMAL HIGH (ref 11.0–14.6)
WBC: 9 10*3/uL (ref 4.0–10.3)

## 2017-09-15 LAB — FERRITIN: Ferritin: 138 ng/mL (ref 22–316)

## 2017-09-15 LAB — IRON AND TIBC
Iron: 69 ug/dL (ref 42–163)
Saturation Ratios: 26 % — ABNORMAL LOW (ref 42–163)
TIBC: 268 ug/dL (ref 202–409)
UIBC: 199 ug/dL

## 2017-09-15 MED ORDER — CHLORPROMAZINE HCL 25 MG PO TABS: ORAL_TABLET | ORAL | 0 refills | Status: DC

## 2017-09-15 MED ORDER — SODIUM CHLORIDE 0.9% FLUSH
10.0000 mL | INTRAVENOUS | Status: DC | PRN
  Filled 2017-09-15: qty 10

## 2017-09-15 MED ORDER — PROCHLORPERAZINE MALEATE 10 MG PO TABS
ORAL_TABLET | ORAL | Status: AC
  Filled 2017-09-15: qty 1

## 2017-09-17 ENCOUNTER — Inpatient Hospital Stay: Payer: Medicare HMO

## 2017-09-29 NOTE — Progress Notes (Signed)
[facility]  [phone] [phone]  Clinic Follow up Note   Patient Care Team: Octavio Graves, DO as PCP - General Truitt Merle, MD as Consulting Physician (Hematology) Alla Feeling, NP as Nurse Practitioner (Nurse Practitioner) Stark Klein, MD as Consulting Physician (General Surgery) Rogene Houston, MD as Consulting Physician (Gastroenterology) Milus Banister, MD as Attending Physician (Gastroenterology) Shea Evans [name], LCSW as [employer] (Licensed Clinical Social Worker)   Date of Service:  09/30/2017   CHIEF COMPLAINTS:  Follow up gastric cancer   SUMMARY OF ONCOLOGIC HISTORY: Oncology History   Cancer Staging Malignant neoplasm of body of stomach ([facility]) Staging form: Stomach, AJCC 8th Edition - Clinical stage from 03/11/2017: Stage IIB (cT3, cN0, cM0) - Unsigned - Pathologic stage from 04/22/2017: Stage IIIB (pT4a, pN3a, cM0) - Signed by Alla Feeling, NP on 05/17/2017       Malignant neoplasm of body of stomach ([facility])   02/17/2017 Initial Diagnosis    Malignant neoplasm of body of stomach ([facility])      02/17/2017 Pathology Results    Diagnosis Stomach, biopsy, gastric ulcer - ADENOCARCINOMA. Microscopic Comment Several of the fragments are involved by moderately differentiated adenocarcinoma.      02/17/2017 Procedure    UPPER ENDOSCOPY  FINDINGS - Normal esophagus. - Z-line irregular, 44 cm from the incisors. - Red blood in the gastric body and in the gastric antrum. - Large gastric ulcer. Biopsied. - Erythematous mucosa in the antrum. - Normal cardia, gastric fundus, gastric body and pylorus. - Normal duodenal bulb and second portion of the duodenum. Comment: Endoscopic appearence concerning for malignant ulcer.      03/11/2017 Procedure    UPPER EUS PER DR. Ardis Hughs Findings: 1. The esophagus was normal. 2. There was a 2-3cm ulcerated, malignant mass along the greater curvature  of the stomach, approximately mid-body. The mass was non-circumferential. 3. The duodenum was normal. Endosonographic Finding 1. The gastric mass above correlated with a hypoechoic and heterogenous non-circumferential mass that measured 2.9cm across, 44m deep. The endosonographic borders were poorly defined and there was clear sonographic evidence suggesting invasion into and through the muscularis propria layer without evident invasion into nearby organs (uT3). 2. The duodenal lymphnode described on recent CT scan appeared reactive by [place]. 3. No perigastric adenopathy (uN0) 4. Limited views of the liver, spleen, pancreas, bile duct, gallbladder were all normal. - Along the greater curvature of the stomach, approximately mid-body, there is a 2.9cm uT3N0 (clinical stage IIB) gastric adenocarcinoma.      04/06/2017 Imaging    CT CHEST IMPRESSION: 1. No acute cardiopulmonary abnormalities. 2. Two small pulmonary nodules are noted measuring up to 5 mm. Nonspecific but warrant attention on follow-up imaging follow up 3. Nonspecific hyperdense, possibly enhancing lesion along the dome of liver is identified. In a patient that is at increased risk for more definitive assessment of this structure with contrast enhanced MRI of the liver is advised.      04/21/2017 Imaging    MR ABDOMEN IMPRESSION: 1. Enhancing 2.9 cm mass along the lesser curvature in the gastric antrum, compatible with known gastric malignancy . 2. Mildly enlarged gastrohepatic ligament lymph node, cannot exclude nodal metastasis. 3. No definite liver metastatic disease. Hyperenhancing 0.9 cm liver dome mass remains inconclusive, although the MRI features are most suggestive of a flash filling hemangioma, and the mass has been stable for nearly 2 months. Additional subcentimeter focus of hyperenhancement in the lateral segment left liver  lobe is most likely a benign transient vascular phenomenon. Recommend  attention to these lesions on a follow-up MRI abdomen without and with IV contrast in 3-6 months. This recommendation follows ACR consensus guidelines: Management of Incidental Liver Lesions on CT: A White Paper of the ACR Incidental Findings Committee. J Am Coll Radiol 2017; 12:7517-0017. 4. Benign right adrenal adenoma.        04/22/2017 Pathology Results    Diagnosis 1. Liver, biopsy - BILE DUCT HAMARTOMA. - THERE IS NO EVIDENCE OF MALIGNANCY. 2. Lymph nodes, regional resection, portal - METASTATIC CARCINOMA IN 2 OF 6 LYMPH NODES (2/6). 3. Stomach, resection for tumor, distal - INVASIVE ADENOCARCINOMA, POORLY DIFFERENTIATED, SPANNING 3.8 CM. - PERINEURAL INVASION IS IDENTIFIED. - ADENOCARCINOMA INVOLVES THE SEROSA. - METASTATIC CARCINOMA IN 12 OF 30 LYMPH NODES (12/30), WITH EXTRACAPSULAR EXTENSION. - SEE ONCOLOGY TABLE BELOW. 4. Lymph node, biopsy, common hepatic artery - THERE IS NO EVIDENCE OF CARCINOMA IN 1 OF 1 LYMPH NODE (0/1). Microscopic Comment 3. STOMACH: Specimen: Stomach. Procedure: Partial gastrectomy. Tumor Site: Greater curvature. Tumor Size: 3.8 cm Histologic Type: Adenocarcinoma. Histologic Grade: G3: poorly differentiated. Microscopic Extent of Tumor: Adenocarcinoma involves the serosa. Margins (select all that apply): Adenoca Proximal Margin: Negative for adenocarcinoma. Distal Margin: Negative for adenocarcinoma. Treatment Effect: N/A Lymph-Vascular Invasion: Not identified. Perineural Invasion: Present. Additional findings: Chronic gastritis. Ancillary testing: Can be performed upon clinician request. 1 of 3 Duplicate copy FINAL for [name], [name] ([id number]) Microscopic Comment(continued) Lymph nodes: number examined - 37; number positive: 14 Pathologic Staging: pT4a, pN3a (JBK:gt, 04/27/17)      06/02/2017 -  Adjuvant Chemotherapy    FOLFOX every 2 weeks       HISTORY OF PRESENTING ILLNESS:  Gabriel Poole 72 y.o. male is  here because of newly diagnosed gastric cancer. Initially, he began having symptoms of bilateral abdominal pain, bloating, and decreased appetite in 07/2016 that persisted for 1.5 months before seeking care with PCP. He reports 40 pounds weight loss over 4-5 months. Denies nausea, vomiting, constipation, diarrhea, GI bleeding, or early satiety. Then referred to gastroenterologist in [place]. Work up for gall stones was negative. He had good performance status and working at [employer]. EGD by Dr. Laural Golden noted ulcerated mass seen on the greater curvature. CT [facility] showed known malignancy in the antral region of the stomach and a single lymph node adjacent to the duodenal bulb. He was referred to Dr. Ardis Hughs for EUS which confirmed gastric mass spanning 2.9 cm across and 7 mm deep suggesting invasion into and through the muscularis propria without evidence of invasion into nearby organs (uT3); lymph node on CT appeared to be reactive (uN0).     He was referred to Dr. Barry Dienes for surgery consult. CT chest completed staging work up which indicated two non-specific pulmonary nodules and a non-specific, hyperdense, possibly enhancing lesion along the dome of the liver. F/u MR abdomen revealed no definitive liver metastasis, the hyperenhancing lesion is thought to be flash filling hemangioma. He then underwent laparoscopic diagnostic ERAS pathway with partial gastrectomy and jejunal feeding tube placement on 04/25/17. Surgical pathology revealed poorly differentiated adenocarcinoma of the stomach, spanning 3.8cm with perineural invasion and with involvement of the serosa. 12/30 lymph nodes were positive for metastatic carcinoma with extracapsular extension. 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 One lymph node was also surveyed along the common hepatic artery which was negative for carcinoma. He was then referred  to oncology for further management.   In the past he was diagnosed with arthritis, heart murmur, HTN, hyperlipidemia, gout, hydrocele, and elevated PSA. Previous colonoscopy in 2015 was positive for sessile polyp but otherwise unremarkable and he is on 5-year follow up plan. He drinks one alcoholic drink daily. He smoked 0.5 PPD for 45 years, quit smoking before surgery 04/2017. Family history is positive for 2 maternal aunts diagnosed with leukemia. He is divorced and re-married, has 3 children who are healthy. He is accompanied by his ex-wife today.  Since his surgery he has recovered well. He uses J-tube occasionally but feeds make him nauseous; gets most nutrition and hydration by mouth. He last flushed tube 2 days ago, he has 8/10 left abdominal pain and is asking for refill of pain medication today, often increases to 8/10 at night. BMs are regular, no n/v. He is able to care for himself and complete ADLs independently. He does not drive, but has family who can transport him for appointments, he lives in [place], approx 30 minutes from [place].   CURRENT THERAPY:  Adjuvant FOLFOX q2 weeks started on 06/02/17. Reduced oxaliplatin starting with cycle 4 due to cold sensitivity and neuropathy. Stopped oxaliplatin from cycle 5 due to neuropathy.    INTERVAL HISTORY:  Gabriel Poole is here for follow up and cycle 8 chemotherapy. He presents to the clinic by himself. He notes his usual transportation is no longer running. He notes he has 2 teeth extracted Last week. He plans to get more teeth extractions. He notes he would like to return to work soon. He notes he would work 6 hours a day for 5 days.   On review of symptoms, pt notes on his back and stomach he has been itching. He denies seeing a rash. He denies fever or any concerning symptoms.    REVIEW OF SYSTEMS:    Constitutional: Denies fever or chills  Eyes: Denies blurriness of vision Ears, nose, mouth, throat, and face: Denies  mucositis Respiratory: Denies cough, dyspnea or wheezes Cardiovascular: Denies palpitation, chest discomfort or lower extremity swelling Gastrointestinal:  Denies nausea, vomiting, constipation, diarrhea, heartburn or change in bowel habits (+) feeding tube, clamped Skin: Denies abnormal skin rashes (+) skin itching on stomach and upper back.  Lymphatics: Denies new lymphadenopathy or easy bruising Neurological: Denies new weaknesses (+) cold sensitivity Behavioral/Psych: Mood is stable, no new changes  All other systems were reviewed with the patient and are negative.  MEDICAL HISTORY:  Past Medical History:  Diagnosis Date  . Arthritis   . Cancer [facility]) dx oct 02-2017   stomach  . Gout   . Heart murmur   . Hyperlipidemia   . Hypertension   . Stomach cancer [facility])     SURGICAL HISTORY: Past Surgical History:  Procedure Laterality Date  . COLONOSCOPY    . ESOPHAGOGASTRODUODENOSCOPY N/A 02/17/2017   Procedure: ESOPHAGOGASTRODUODENOSCOPY (EGD);  Surgeon: Rogene Houston, MD;  [facility];  Service: Endoscopy;  Laterality: N/A;  3:00  . EUS N/A 03/11/2017   Procedure: UPPER ENDOSCOPIC ULTRASOUND (EUS) LINEAR;  Surgeon: Milus Banister, MD;  Location: [facility];  Service: Endoscopy;  Laterality: N/A;  . EUS N/A 03/11/2017   Procedure: UPPER ENDOSCOPIC ULTRASOUND (EUS) RADIAL;  Surgeon: Milus Banister, MD;  Location: [facility];  Service: Endoscopy;  Laterality: N/A;  . FINGER SURGERY     Lt middle   . GASTRECTOMY N/A 04/22/2017   Procedure: PARTIAL GASTRECTOMY;  Surgeon: Stark Klein, MD;  Location: [place] OR;  Service: General;  Laterality: N/A;  . GASTROJEJUNOSTOMY N/A 04/22/2017   Procedure: JEJUNAL FEEDING TUBE PLACEMENT;  Surgeon: Stark Klein, MD;  Location: [place];  Service: General;  Laterality: N/A;  . HYDROCELE EXCISION  02/12/2011   Procedure: HYDROCELECTOMY ADULT;  Surgeon: Marissa Nestle;  Location: [facility];  Service: Urology;  Laterality: Left;  .  LAPAROSCOPY N/A 04/22/2017   Procedure: LAPAROSCOPY DIAGNOSTIC ERAS PATHWAY;  Surgeon: Stark Klein, MD;  Location: [place];  Service: General;  Laterality: N/A;  EPIDURAL  . PORTACATH PLACEMENT N/A 05/28/2017   Procedure: INSERTION PORT-A-CATH ERAS PATHWAY;  Surgeon: Stark Klein, MD;  Location: [place];  Service: General;  Laterality: N/A;    I have reviewed the social history and family history with the patient and they are unchanged from previous note.  ALLERGIES:  is allergic to penicillins.  MEDICATIONS:  Current Outpatient Medications  Medication Sig Dispense Refill  . amLODipine (NORVASC) 10 MG tablet Take 1 tablet (10 mg total) by mouth daily. 90 tablet 0  . aspirin EC 81 MG tablet Take 81 mg by mouth daily.    Marland Kitchen atorvastatin (LIPITOR) 40 MG tablet Take 1 tablet (40 mg total) by mouth daily at 6 PM. 90 tablet 0  . azithromycin (ZITHROMAX) 500 MG tablet Take 1 tablet (500 mg total) by mouth daily. 5 tablet 0  . chlorproMAZINE (THORAZINE) 25 MG tablet TAKE 1 TABLET BY MOUTH 3 TIMES DAILY AS NEEDED FOR HICCUPS 10 tablet 0  . Cholecalciferol (VITAMIN D3) 5000 units CAPS Take 5,000 Units by mouth daily.    Marland Kitchen dexamethasone (DECADRON) 4 MG tablet Take 2 tablets (8 mg total) by mouth daily. Start the day after chemotherapy for 2 days. Take with food. 30 tablet 1  . lidocaine-prilocaine (EMLA) cream Apply to affected area once 30 g 3  . metoprolol tartrate (LOPRESSOR) 50 MG tablet Take 50 mg by mouth 2 (two) times daily.    Marland Kitchen omeprazole (PRILOSEC) 20 MG capsule Take 20 mg by mouth daily.    . ondansetron (ZOFRAN) 8 MG tablet Take 1 tablet (8 mg total) by mouth 2 (two) times daily as needed for refractory nausea / vomiting. Start on day 3 after chemotherapy. 30 tablet 1  . pantoprazole (PROTONIX) 40 MG tablet Take 1 tablet (40 mg total) by mouth 2 (two) times daily before a meal. 60 tablet 4  . potassium chloride SA (K-DUR,KLOR-CON) 20 MEQ tablet Take 1 tablet (20 mEq total)  by mouth 2 (two) times daily. 40 tablet 1  . prochlorperazine (COMPAZINE) 10 MG tablet Take 1 tablet (10 mg total) by mouth every 6 (six) hours as needed (Nausea or vomiting). 30 tablet 1  . ranitidine (ZANTAC) 300 MG tablet Take 300 mg by mouth at bedtime.    . sucralfate (CARAFATE) 1 g tablet Take 1 tablet (1 g total) by mouth 2 (two) times daily. 60 tablet 0  . triamterene-hydrochlorothiazide (MAXZIDE-25) 37.5-25 MG tablet Take 1 tablet by mouth daily.     No current facility-administered medications for this visit.     PHYSICAL EXAMINATION: ECOG PERFORMANCE STATUS: 1 - Symptomatic but completely ambulatory BP (!) 156/76 (BP Location: Left Arm, Patient Position: Sitting)   Pulse (!) 53   Temp 97.6 F (36.4 C) (Oral)   Resp 18   Ht '5\' 10"'$  (1.778 m)   Wt 151 lb 1.6 oz (68.5 kg)   SpO2 100%   BMI 21.68 kg/m   ENERAL:alert, no distress and comfortable  SKIN: skin color, texture, turgor are normal, no rashes or significant lesions EYES: normal, Conjunctiva are pink and non-injected, sclera clear OROPHARYNX:no exudate, no erythema and lips, buccal mucosa, and tongue normal  NECK: supple, thyroid normal size, non-tender, without nodularity LYMPH:  no palpable cervical, supraclavicular, axillary, or inguinal lymphadenopathy LUNGS: clear to auscultation bilaterally with normal breathing effort HEART: regular rate & rhythm and no murmurs and no lower extremity edema ABDOMEN:abdomen soft, non-tender and normal bowel sounds. (+) vertical midline surgical incision is well healed (+) J tube to abdomen, clamped, site covered with gauze dressing c/d/i Musculoskeletal:no cyanosis of digits and no clubbing  NEURO: alert & oriented x 3 with fluent speech, no focal motor/sensory deficits PAC without erythema    LABORATORY DATA:  I have reviewed the data as listed CBC Latest Ref Rng & Units 09/30/2017 09/15/2017 09/01/2017  WBC 4.0 - 10.3 K/uL 6.5 9.0 5.1  Hemoglobin 13.0 - 17.1 g/dL 12.0(L) 12.5(L)  11.9(L)  Hematocrit 38.4 - 49.9 % 37.9(L) 38.5 36.6(L)  Platelets 140 - 400 K/uL 250 213 229     CMP Latest Ref Rng & Units 09/30/2017 09/15/2017 09/01/2017  Glucose 70 - 140 mg/dL 77 118 78  BUN 7 - 26 mg/dL '15 16 9  '$ Creatinine 0.70 - 1.30 mg/dL 1.21 1.04 1.00  Sodium 136 - 145 mmol/L 142 139 144  Potassium 3.5 - 5.1 mmol/L 3.6 3.4(L) 3.4(L)  Chloride 98 - 109 mmol/L 109 107 109  CO2 22 - 29 mmol/L '27 27 25  '$ Calcium 8.4 - 10.4 mg/dL 9.7 9.7 9.9  Total Protein 6.4 - 8.3 g/dL 6.6 6.2(L) 6.5  Total Bilirubin 0.2 - 1.2 mg/dL 0.5 0.5 0.6  Alkaline Phos 40 - 150 U/L 102 89 83  AST 5 - 34 U/L '27 23 26  '$ ALT 0 - 55 U/L 22 39 25     PATHOLOGY  Diagnosis10/10/18 Stomach, biopsy, gastric ulcer - ADENOCARCINOMA. Microscopic Comment Several of the fragments are involved by moderately differentiated adenocarcinoma.   Diagnosis12/13/18 1. Liver, biopsy - BILE DUCT HAMARTOMA. - THERE IS NO EVIDENCE OF MALIGNANCY. 2. Lymph nodes, regional resection, portal - METASTATIC CARCINOMA IN 2 OF 6 LYMPH NODES (2/6). 3. Stomach, resection for tumor, distal - INVASIVE ADENOCARCINOMA, POORLY DIFFERENTIATED, SPANNING 3.8 CM. - PERINEURAL INVASION IS IDENTIFIED. - ADENOCARCINOMA INVOLVES THE SEROSA. - METASTATIC CARCINOMA IN 12 OF 30 LYMPH NODES (12/30), WITH EXTRACAPSULAR EXTENSION. - SEE ONCOLOGY TABLE BELOW. 4. Lymph node, biopsy, common hepatic artery - THERE IS NO EVIDENCE OF CARCINOMA IN 1 OF 1 LYMPH NODE (0/1). Microscopic Comment 3. STOMACH: Specimen: Stomach. Procedure: Partial gastrectomy. Tumor Site: Greater curvature. Tumor Size: 3.8 cm Histologic Type: Adenocarcinoma. Histologic Grade: G3: poorly differentiated. Microscopic Extent of Tumor: Adenocarcinoma involves the serosa. Margins (select all that apply): Adenoca Proximal Margin: Negative for adenocarcinoma. Distal Margin: Negative for adenocarcinoma. Treatment Effect: N/A Lymph-Vascular Invasion: Not identified. Perineural  Invasion: Present. Additional findings: Chronic gastritis. Ancillary testing: Can be performed upon clinician request. 1 of 3 Duplicate copy FINAL for ERHARD, SENSKE ([id number]) Microscopic Comment(continued) Lymph nodes: number examined - 37; number positive: 14 Pathologic Staging: pT4a, pN3a (JBK:gt, 04/27/17)     RADIOGRAPHIC STUDIES: I have personally reviewed the radiological images as listed and agreed with the findings in the report. No results found.   ASSESSMENT & PLAN: This is a wonderful72 y.o.malewho presents into the clinic today to discuss the following:   1. Primary adenocarcinoma of the pyloric antrum, pT4a, PN3aM0, stage IIIB, Grade 3, MSI-stable  -We  previously reviewed imaging and surgical pathology with the patient and family in detail  -He underwent partial gastrostomy and lymph node D2 dissection with clear margins. -Due to his very high risk of cancer recurrence, I recommend him to have adjuvant chemotherapy to reduce his risk of recurrence. -Started adjuvant FOLFOX on 06/02/17.  -he is doing well, tolerating FOLFOX well. Gabriel continue every 2 weeks, plan for a total of 12 cycles  -He developed significant cold sensitivity. I encouraged him to stay away from the cold and cold drinks and substances. He has also developed neuropathy in his feet.   -I previously recommend gabapentin, he tried 300 mg twice daily for a week, and stopped due to the lack of efficacy.  I recommend him to increase to 600 mg at night, and continue 300 mg in the morning and early afternoon, but his not compliant -I previously prescribed Cymbalta but he declined  -I did reduce oxaliplatin dose on 07/14/17 with cycle 4 due to his neuropathy. I discontinued Oxaliplatin to prevent worsening of his neuropathy (most are cold sensitivity) on 07/28/17. He received 4 cycles of FOLFOX.  He agreed to continue 5-FU every 2 weeks. -His neuropathy has resolved -She has repeatedly requested to stop  chemo earlier, he has difficulty to understand why he needs chemotherapy.  After several lengthy discussions, we decided to stop chemo after cycle 8  -We discussed today is his last cycle chemo, labs reviewed, adequate for treatment.  -Discussed his cancer recurrence, and risk of recurrence.  We Gabriel see him every 3 to 6 months, with lab and physical exam.  I Gabriel plan to repeat scan every 6 to 12 months for the first 2 to 3 years.  Next scan next month  -He is fine to continue teeth extractions and dental work 2-3 weeks after stopping chemo.  -F/u in 4-5 weeks    2. Anemia  -Hgb 11.9 on 06/02/17, he was started on oral iron supplement -Hg at 12 today (09/30/17), continue oral iron.   3.  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  -I recommended him to try gabapentin, and increase dose -We Gabriel not refill his narcotics.  We discussed narcotics addiction.  Patient has been asking for narcotics repeatedly, but he does not appear to be in distress or pain, and his peripheral vibration sensation test was only mildly diminished. -I previously prescribed Cymbalta but he did not want to try.  -I advised him he can increase his dose of Neurontin to 600 mg at bedtime, and 300 mg in the morning and early afternoon -Discontinued Oxaliplatin as of 07/28/17.  His neuropathy has most resolved. -he has repeatedly ask narcotic pain medication for his pain in his feet that he has had prior to chemo. He previously received narcotics from his PCP Dr. Melina Copa. I offered him a referral to pain specialist and he declined. I Gabriel send a message to his PCP Dr. Melina Copa. Per patient, he has been taking pain medication from his friends lately. This is suspicious for narcotics addiction.  -I Gabriel not prescribe any pain medication and xanax.   4. Insomnia  -he reports he has had trouble sleeping  since starting chemo. He has been taking Xanax each night that he got from a friend.  -I advised him he can try melatonin or benadryl for sleep.    5. Hypokalemia  -K is 2.9 today on  08/18/17, I previously prescribed K supplement  -K normal at 3.6 today (09/30/08)   6. Unspecified skin eruption  -He developed pruritic scattered skin lesions previously. I implored him to stop putting bleach on the areas and instead apply benadryl for itching and alternate with hydrocortisone. This does not appear to be a typical drug rash, I do not think it is related to chemo. Gabriel monitor closely.  -No sign of rash but does have skin itching. I advised him to keep dry skin moisturized.   7. Smoking Cessation -He notes he still smokes.  -I strongly recommend he stop smoking as this can increase risk of lung cancer.     PLAN -Labs adequate to proceed with Last cycle chemo 5-fu/LV today  -F/u in 4-5 weeks -With lab, flush and CT CAP a few days ago     Orders Placed This Encounter  Procedures  . CT Abdomen Pelvis W Contrast    Standing Status:   Future    Standing Expiration Date:   09/30/2018    Order Specific Question:   If indicated for the ordered procedure, I authorize the administration of contrast media per Radiology protocol    Answer:   Yes    Order Specific Question:   Preferred imaging location?    Answer:   [facility]    Order Specific Question:   Is Oral Contrast requested for this exam?    Answer:   Yes, Per Radiology protocol    Order Specific Question:   Radiology Contrast Protocol - do NOT remove file path    Answer:   [email or web address]  . CT Chest W Contrast    Standing Status:   Future    Standing Expiration Date:   09/30/2018    Order Specific Question:   If indicated for the ordered procedure, I authorize the administration of contrast media per Radiology protocol    Answer:   Yes    Order Specific Question:   Preferred imaging location?      Answer:   [facility]    Order Specific Question:   Radiology Contrast Protocol - do NOT remove file path    Answer:   [email or web address]   All questions were answered. The patient knows to call the clinic with any problems, questions or concerns. No barriers to learning was detected.  I spent 20 minutes counseling the patient face to face. The total time spent in the appointment was 25 minutes and more than 50% was on counseling and review of test results  This document serves as a record of services personally performed by Truitt Merle, MD. It was created on her behalf by Joslyn Devon, a trained medical scribe. The creation of this record is based on the scribe's personal observations and the provider's statements to them.    I have reviewed the above documentation for accuracy and completeness, and I agree with the above.     Truitt Merle, MD 09/30/17

## 2017-09-30 ENCOUNTER — Inpatient Hospital Stay: Payer: Medicare HMO

## 2017-09-30 ENCOUNTER — Telehealth: Payer: Self-pay | Admitting: Hematology

## 2017-09-30 ENCOUNTER — Inpatient Hospital Stay (HOSPITAL_BASED_OUTPATIENT_CLINIC_OR_DEPARTMENT_OTHER): Payer: Medicare HMO | Admitting: Hematology

## 2017-09-30 ENCOUNTER — Encounter: Payer: Self-pay | Admitting: Hematology

## 2017-09-30 VITALS — BP 156/76 | HR 53 | Temp 97.6°F | Resp 18 | Ht 70.0 in | Wt 151.1 lb

## 2017-09-30 DIAGNOSIS — E876 Hypokalemia: Secondary | ICD-10-CM | POA: Diagnosis not present

## 2017-09-30 DIAGNOSIS — C162 Malignant neoplasm of body of stomach: Secondary | ICD-10-CM

## 2017-09-30 DIAGNOSIS — F17200 Nicotine dependence, unspecified, uncomplicated: Secondary | ICD-10-CM | POA: Diagnosis not present

## 2017-09-30 DIAGNOSIS — I10 Essential (primary) hypertension: Secondary | ICD-10-CM | POA: Diagnosis not present

## 2017-09-30 DIAGNOSIS — D649 Anemia, unspecified: Secondary | ICD-10-CM | POA: Diagnosis not present

## 2017-09-30 DIAGNOSIS — Z5111 Encounter for antineoplastic chemotherapy: Secondary | ICD-10-CM | POA: Diagnosis not present

## 2017-09-30 DIAGNOSIS — G629 Polyneuropathy, unspecified: Secondary | ICD-10-CM

## 2017-09-30 DIAGNOSIS — Z95828 Presence of other vascular implants and grafts: Secondary | ICD-10-CM

## 2017-09-30 DIAGNOSIS — C163 Malignant neoplasm of pyloric antrum: Secondary | ICD-10-CM | POA: Diagnosis not present

## 2017-09-30 DIAGNOSIS — Z79899 Other long term (current) drug therapy: Secondary | ICD-10-CM

## 2017-09-30 DIAGNOSIS — R21 Rash and other nonspecific skin eruption: Secondary | ICD-10-CM

## 2017-09-30 DIAGNOSIS — E785 Hyperlipidemia, unspecified: Secondary | ICD-10-CM | POA: Diagnosis not present

## 2017-09-30 DIAGNOSIS — R197 Diarrhea, unspecified: Secondary | ICD-10-CM | POA: Diagnosis not present

## 2017-09-30 LAB — CMP (CANCER CENTER ONLY)
ALBUMIN: 3.8 g/dL (ref 3.5–5.0)
ALK PHOS: 102 U/L (ref 40–150)
ALT: 22 U/L (ref 0–55)
ANION GAP: 6 (ref 3–11)
AST: 27 U/L (ref 5–34)
BUN: 15 mg/dL (ref 7–26)
CHLORIDE: 109 mmol/L (ref 98–109)
CO2: 27 mmol/L (ref 22–29)
Calcium: 9.7 mg/dL (ref 8.4–10.4)
Creatinine: 1.21 mg/dL (ref 0.70–1.30)
GFR, EST NON AFRICAN AMERICAN: 58 mL/min — AB (ref >60)
GFR, Est AFR Am: >60 mL/min (ref >60)
Glucose, Bld: 77 mg/dL (ref 70–140)
POTASSIUM: 3.6 mmol/L (ref 3.5–5.1)
Sodium: 142 mmol/L (ref 136–145)
Total Bilirubin: 0.5 mg/dL (ref 0.2–1.2)
Total Protein: 6.6 g/dL (ref 6.4–8.3)

## 2017-09-30 LAB — CBC WITH DIFFERENTIAL (CANCER CENTER ONLY)
BASOS ABS: 0 10*3/uL (ref 0.0–0.1)
Basophils Relative: 1 %
Eosinophils Absolute: 0.1 10*3/uL (ref 0.0–0.5)
Eosinophils Relative: 2 %
HEMATOCRIT: 37.9 % — AB (ref 38.4–49.9)
Hemoglobin: 12 g/dL — ABNORMAL LOW (ref 13.0–17.1)
LYMPHS PCT: 20 %
Lymphs Abs: 1.3 10*3/uL (ref 0.9–3.3)
MCH: 30 pg (ref 27.2–33.4)
MCHC: 31.7 g/dL — ABNORMAL LOW (ref 32.0–36.0)
MCV: 94.8 fL (ref 79.3–98.0)
Monocytes Absolute: 0.5 10*3/uL (ref 0.1–0.9)
Monocytes Relative: 8 %
NEUTROS ABS: 4.5 10*3/uL (ref 1.5–6.5)
NEUTROS PCT: 69 %
Platelet Count: 250 10*3/uL (ref 140–400)
RBC: 4 MIL/uL — AB (ref 4.20–5.82)
RDW: 14.8 % — ABNORMAL HIGH (ref 11.0–14.6)
WBC Count: 6.5 10*3/uL (ref 4.0–10.3)

## 2017-09-30 MED ORDER — DEXTROSE 5 % IV SOLN
400.0000 mg/m2 | Freq: Once | INTRAVENOUS | Status: AC
  Administered 2017-09-30: 744 mg via INTRAVENOUS
  Filled 2017-09-30: qty 37.2

## 2017-09-30 MED ORDER — SODIUM CHLORIDE 0.9% FLUSH
10.0000 mL | INTRAVENOUS | Status: DC | PRN
  Administered 2017-09-30: 10 mL via INTRAVENOUS
  Filled 2017-09-30: qty 10

## 2017-09-30 MED ORDER — DEXTROSE 5 % IV SOLN
Freq: Once | INTRAVENOUS | Status: AC
  Administered 2017-09-30: 10:00:00 via INTRAVENOUS

## 2017-09-30 MED ORDER — PROCHLORPERAZINE MALEATE 10 MG PO TABS
ORAL_TABLET | ORAL | Status: AC
Start: 2017-09-30 — End: ?
  Filled 2017-09-30: qty 1

## 2017-09-30 MED ORDER — FLUOROURACIL CHEMO INJECTION 5 GM/100ML
2400.0000 mg/m2 | INTRAVENOUS | Status: DC
  Administered 2017-09-30: 4450 mg via INTRAVENOUS
  Filled 2017-09-30: qty 89

## 2017-09-30 MED ORDER — PROCHLORPERAZINE MALEATE 10 MG PO TABS
10.0000 mg | ORAL_TABLET | Freq: Four times a day (QID) | ORAL | Status: DC | PRN
  Administered 2017-09-30: 10 mg via ORAL

## 2017-09-30 NOTE — Patient Instructions (Signed)
Cancer Center Discharge Instructions for Patients Receiving Chemotherapy  Today you received the following chemotherapy agents: Leucovorin and Fluorouracil (Adrucil, 5-FU)  To help prevent nausea and vomiting after your treatment, we encourage you to take your nausea medication as prescribed.    If you develop nausea and vomiting that is not controlled by your nausea medication, call the clinic.   BELOW ARE SYMPTOMS THAT SHOULD BE REPORTED IMMEDIATELY:  *FEVER GREATER THAN 100.5 F  *CHILLS WITH OR WITHOUT FEVER  NAUSEA AND VOMITING THAT IS NOT CONTROLLED WITH YOUR NAUSEA MEDICATION  *UNUSUAL SHORTNESS OF BREATH  *UNUSUAL BRUISING OR BLEEDING  TENDERNESS IN MOUTH AND THROAT WITH OR WITHOUT PRESENCE OF ULCERS  *URINARY PROBLEMS  *BOWEL PROBLEMS  UNUSUAL RASH Items with * indicate a potential emergency and should be followed up as soon as possible.  Feel free to call the clinic should you have any questions or concerns. The clinic phone number is [phone].  Please show the CHEMO ALERT CARD at check-in to the Emergency Department and triage nurse.   

## 2017-09-30 NOTE — Telephone Encounter (Signed)
Appointments scheduled, contrast material provided, AVS/Calendar printed per 5/23 los

## 2017-10-02 ENCOUNTER — Inpatient Hospital Stay: Payer: Medicare HMO

## 2017-10-02 VITALS — BP 149/83 | HR 58 | Temp 98.6°F | Resp 18

## 2017-10-02 DIAGNOSIS — I10 Essential (primary) hypertension: Secondary | ICD-10-CM | POA: Diagnosis not present

## 2017-10-02 DIAGNOSIS — Z5111 Encounter for antineoplastic chemotherapy: Secondary | ICD-10-CM | POA: Diagnosis not present

## 2017-10-02 DIAGNOSIS — E785 Hyperlipidemia, unspecified: Secondary | ICD-10-CM | POA: Diagnosis not present

## 2017-10-02 DIAGNOSIS — G629 Polyneuropathy, unspecified: Secondary | ICD-10-CM | POA: Diagnosis not present

## 2017-10-02 DIAGNOSIS — C162 Malignant neoplasm of body of stomach: Secondary | ICD-10-CM

## 2017-10-02 DIAGNOSIS — D649 Anemia, unspecified: Secondary | ICD-10-CM | POA: Diagnosis not present

## 2017-10-02 DIAGNOSIS — C163 Malignant neoplasm of pyloric antrum: Secondary | ICD-10-CM | POA: Diagnosis not present

## 2017-10-02 DIAGNOSIS — R197 Diarrhea, unspecified: Secondary | ICD-10-CM | POA: Diagnosis not present

## 2017-10-02 DIAGNOSIS — E876 Hypokalemia: Secondary | ICD-10-CM | POA: Diagnosis not present

## 2017-10-02 DIAGNOSIS — R21 Rash and other nonspecific skin eruption: Secondary | ICD-10-CM | POA: Diagnosis not present

## 2017-10-02 MED ORDER — HEPARIN SOD (PORK) LOCK FLUSH 100 UNIT/ML IV SOLN
500.0000 [IU] | Freq: Once | INTRAVENOUS | Status: AC | PRN
  Administered 2017-10-02: 500 [IU]
  Filled 2017-10-02: qty 5

## 2017-10-02 MED ORDER — SODIUM CHLORIDE 0.9% FLUSH
10.0000 mL | INTRAVENOUS | Status: DC | PRN
  Administered 2017-10-02: 10 mL
  Filled 2017-10-02: qty 10

## 2017-10-11 DIAGNOSIS — R69 Illness, unspecified: Secondary | ICD-10-CM | POA: Diagnosis not present

## 2017-10-12 ENCOUNTER — Other Ambulatory Visit: Payer: Self-pay | Admitting: Licensed Clinical Social Worker

## 2017-10-12 NOTE — Patient Outreach (Signed)
Assessment:  CSW was unable to contact client on 10/12/17 via phone at client's new phone number of [phone]. CSW thus called Gabriel Poole, ex-wife of client on 10/12/17 and spoke via phone with Gabriel Poole. CSW verified identity of Gabriel Poole. CSW received verbal permission from Gabriel Poole on 10/12/17 for CSW to speak with Gabriel Poole about client needs. Client has completed his last chemotherapy treatment scheduled.  Client has new phone number of [phone].  Client does not have a car to drive and does not have a driver's license.  Client sees Dr. Melina Copa as primary care doctor. Client has received support with transport needs from Gabriel Poole to Recovery program and from Gabriel Poole.  Client has expressed interest in returning to work. Care plan for client has centered around transport for client to and from his scheduled cancer treatments in [place], [place]. These treatments are now complete. However, client still has transport needs in going to and from his medical appointments or to complete needed errands.  Care plan is for client to communicate with CSW in next 30 days to discuss transport resources for client in the area.  Client has been depending on agency support for transport or on family support for transport. Client needs to talk with CSW in next 30 days to discuss transport agencies of possible help to client in the area. CSW thanked Gabriel Poole for phone conversation with CSW on 10/12/17.  She said she does talk periodically with client in person or via phone  Plan:  Client to talk with CSW in next 30 days to discuss transport resources for client in the community.   CSW to call client or Gabriel Poole in 4 weeks to assess client needs at that time.  Gabriel Poole.Gabriel Poole MSW, LCSW Licensed Clinical Social Worker [facility] [phone] Plan:

## 2017-10-21 ENCOUNTER — Telehealth: Payer: Self-pay | Admitting: Hematology

## 2017-10-21 NOTE — Telephone Encounter (Signed)
Returned call and advised of upcoming appointments for June.  I also provided her with Central Scheduling phone number siince he CT was not yet scheduled. Per 6/12 phone message

## 2017-10-29 ENCOUNTER — Inpatient Hospital Stay: Payer: Medicare HMO | Attending: Nurse Practitioner

## 2017-10-29 ENCOUNTER — Encounter (HOSPITAL_COMMUNITY): Payer: Self-pay

## 2017-10-29 ENCOUNTER — Ambulatory Visit (HOSPITAL_COMMUNITY)
Admission: RE | Admit: 2017-10-29 | Discharge: 2017-10-29 | Disposition: A | Discharge status: Home / Self Care | Payer: Medicare HMO | Source: Ambulatory Visit | Attending: Hematology | Admitting: Hematology

## 2017-10-29 ENCOUNTER — Inpatient Hospital Stay: Payer: Medicare HMO

## 2017-10-29 DIAGNOSIS — Z79899 Other long term (current) drug therapy: Secondary | ICD-10-CM | POA: Diagnosis not present

## 2017-10-29 DIAGNOSIS — F17210 Nicotine dependence, cigarettes, uncomplicated: Secondary | ICD-10-CM | POA: Insufficient documentation

## 2017-10-29 DIAGNOSIS — R918 Other nonspecific abnormal finding of lung field: Secondary | ICD-10-CM | POA: Diagnosis not present

## 2017-10-29 DIAGNOSIS — C162 Malignant neoplasm of body of stomach: Secondary | ICD-10-CM

## 2017-10-29 DIAGNOSIS — C163 Malignant neoplasm of pyloric antrum: Secondary | ICD-10-CM | POA: Diagnosis not present

## 2017-10-29 DIAGNOSIS — I77810 Thoracic aortic ectasia: Secondary | ICD-10-CM | POA: Insufficient documentation

## 2017-10-29 DIAGNOSIS — E876 Hypokalemia: Secondary | ICD-10-CM | POA: Diagnosis not present

## 2017-10-29 DIAGNOSIS — D649 Anemia, unspecified: Secondary | ICD-10-CM | POA: Insufficient documentation

## 2017-10-29 DIAGNOSIS — N400 Enlarged prostate without lower urinary tract symptoms: Secondary | ICD-10-CM | POA: Insufficient documentation

## 2017-10-29 DIAGNOSIS — G4700 Insomnia, unspecified: Secondary | ICD-10-CM | POA: Diagnosis not present

## 2017-10-29 DIAGNOSIS — I10 Essential (primary) hypertension: Secondary | ICD-10-CM | POA: Diagnosis not present

## 2017-10-29 DIAGNOSIS — Z934 Other artificial openings of gastrointestinal tract status: Secondary | ICD-10-CM | POA: Diagnosis not present

## 2017-10-29 DIAGNOSIS — K769 Liver disease, unspecified: Secondary | ICD-10-CM | POA: Insufficient documentation

## 2017-10-29 DIAGNOSIS — Z9889 Other specified postprocedural states: Secondary | ICD-10-CM | POA: Diagnosis not present

## 2017-10-29 DIAGNOSIS — C169 Malignant neoplasm of stomach, unspecified: Secondary | ICD-10-CM | POA: Diagnosis not present

## 2017-10-29 DIAGNOSIS — M109 Gout, unspecified: Secondary | ICD-10-CM | POA: Diagnosis not present

## 2017-10-29 DIAGNOSIS — E785 Hyperlipidemia, unspecified: Secondary | ICD-10-CM | POA: Insufficient documentation

## 2017-10-29 DIAGNOSIS — M1990 Unspecified osteoarthritis, unspecified site: Secondary | ICD-10-CM | POA: Insufficient documentation

## 2017-10-29 DIAGNOSIS — K259 Gastric ulcer, unspecified as acute or chronic, without hemorrhage or perforation: Secondary | ICD-10-CM | POA: Diagnosis not present

## 2017-10-29 DIAGNOSIS — Z95828 Presence of other vascular implants and grafts: Secondary | ICD-10-CM

## 2017-10-29 DIAGNOSIS — I700 Atherosclerosis of aorta: Secondary | ICD-10-CM | POA: Insufficient documentation

## 2017-10-29 LAB — CBC WITH DIFFERENTIAL (CANCER CENTER ONLY)
Basophils Absolute: 0 10*3/uL (ref 0.0–0.1)
Basophils Relative: 1 %
EOS ABS: 0.2 10*3/uL (ref 0.0–0.5)
EOS PCT: 3 %
HCT: 38.4 % (ref 38.4–49.9)
HEMOGLOBIN: 12.4 g/dL — AB (ref 13.0–17.1)
LYMPHS ABS: 1.7 10*3/uL (ref 0.9–3.3)
LYMPHS PCT: 29 %
MCH: 30.2 pg (ref 27.2–33.4)
MCHC: 32.3 g/dL (ref 32.0–36.0)
MCV: 93.4 fL (ref 79.3–98.0)
MONOS PCT: 7 %
Monocytes Absolute: 0.4 10*3/uL (ref 0.1–0.9)
NEUTROS PCT: 60 %
Neutro Abs: 3.7 10*3/uL (ref 1.5–6.5)
Platelet Count: 192 10*3/uL (ref 140–400)
RBC: 4.11 MIL/uL — AB (ref 4.20–5.82)
RDW: 14.3 % (ref 11.0–14.6)
WBC: 6 10*3/uL (ref 4.0–10.3)

## 2017-10-29 LAB — IRON AND TIBC
Iron: 78 ug/dL (ref 42–163)
Saturation Ratios: 28 % — ABNORMAL LOW (ref 42–163)
TIBC: 276 ug/dL (ref 202–409)
UIBC: 198 ug/dL

## 2017-10-29 LAB — CMP (CANCER CENTER ONLY)
ALT: 32 U/L (ref 0–55)
AST: 29 U/L (ref 5–34)
Albumin: 3.9 g/dL (ref 3.5–5.0)
Alkaline Phosphatase: 93 U/L (ref 40–150)
Anion gap: 8 (ref 3–11)
BUN: 10 mg/dL (ref 7–26)
CHLORIDE: 108 mmol/L (ref 98–109)
CO2: 27 mmol/L (ref 22–29)
CREATININE: 0.93 mg/dL (ref 0.70–1.30)
Calcium: 9.7 mg/dL (ref 8.4–10.4)
GFR, Est AFR Am: >60 mL/min (ref >60)
GFR, Estimated: >60 mL/min (ref >60)
Glucose, Bld: 86 mg/dL (ref 70–140)
POTASSIUM: 3.2 mmol/L — AB (ref 3.5–5.1)
Sodium: 143 mmol/L (ref 136–145)
Total Bilirubin: 0.7 mg/dL (ref 0.2–1.2)
Total Protein: 6.4 g/dL (ref 6.4–8.3)

## 2017-10-29 LAB — FERRITIN: FERRITIN: 95 ng/mL (ref 22–316)

## 2017-10-29 MED ORDER — IOPAMIDOL (ISOVUE-300) INJECTION 61%
100.0000 mL | Freq: Once | INTRAVENOUS | Status: AC | PRN
  Administered 2017-10-29: 100 mL via INTRAVENOUS

## 2017-10-29 MED ORDER — HEPARIN SOD (PORK) LOCK FLUSH 100 UNIT/ML IV SOLN
500.0000 [IU] | Freq: Once | INTRAVENOUS | Status: AC
  Administered 2017-10-29: 500 [IU] via INTRAVENOUS

## 2017-10-29 MED ORDER — HEPARIN SOD (PORK) LOCK FLUSH 100 UNIT/ML IV SOLN
INTRAVENOUS | Status: AC
  Filled 2017-10-29: qty 5

## 2017-10-29 MED ORDER — SODIUM CHLORIDE 0.9% FLUSH
10.0000 mL | INTRAVENOUS | Status: DC | PRN
  Administered 2017-10-29: 10 mL via INTRAVENOUS
  Filled 2017-10-29: qty 10

## 2017-10-29 MED ORDER — IOPAMIDOL (ISOVUE-300) INJECTION 61%
INTRAVENOUS | Status: AC
  Filled 2017-10-29: qty 100

## 2017-11-03 ENCOUNTER — Encounter: Payer: Self-pay | Admitting: Hematology

## 2017-11-03 ENCOUNTER — Telehealth: Payer: Self-pay | Admitting: Hematology

## 2017-11-03 ENCOUNTER — Inpatient Hospital Stay (HOSPITAL_BASED_OUTPATIENT_CLINIC_OR_DEPARTMENT_OTHER): Payer: Medicare HMO | Admitting: Hematology

## 2017-11-03 VITALS — BP 155/84 | HR 55 | Temp 97.8°F | Resp 17 | Ht 70.0 in | Wt 153.7 lb

## 2017-11-03 DIAGNOSIS — E876 Hypokalemia: Secondary | ICD-10-CM

## 2017-11-03 DIAGNOSIS — K259 Gastric ulcer, unspecified as acute or chronic, without hemorrhage or perforation: Secondary | ICD-10-CM | POA: Diagnosis not present

## 2017-11-03 DIAGNOSIS — F17210 Nicotine dependence, cigarettes, uncomplicated: Secondary | ICD-10-CM | POA: Diagnosis not present

## 2017-11-03 DIAGNOSIS — C163 Malignant neoplasm of pyloric antrum: Secondary | ICD-10-CM

## 2017-11-03 DIAGNOSIS — G4700 Insomnia, unspecified: Secondary | ICD-10-CM | POA: Diagnosis not present

## 2017-11-03 DIAGNOSIS — Z79899 Other long term (current) drug therapy: Secondary | ICD-10-CM

## 2017-11-03 DIAGNOSIS — D649 Anemia, unspecified: Secondary | ICD-10-CM | POA: Diagnosis not present

## 2017-11-03 DIAGNOSIS — M109 Gout, unspecified: Secondary | ICD-10-CM | POA: Diagnosis not present

## 2017-11-03 DIAGNOSIS — C162 Malignant neoplasm of body of stomach: Secondary | ICD-10-CM

## 2017-11-03 DIAGNOSIS — I10 Essential (primary) hypertension: Secondary | ICD-10-CM | POA: Diagnosis not present

## 2017-11-03 NOTE — Progress Notes (Signed)
[name]  [phone] [phone]  Clinic Follow up Note   Patient Care Team: Octavio Graves, DO as PCP - General Truitt Merle, MD as Consulting Physician (Hematology) Alla Feeling, NP as Nurse Practitioner (Nurse Practitioner) Stark Klein, MD as Consulting Physician (General Surgery) Rogene Houston, MD as Consulting Physician (Gastroenterology) Milus Banister, MD as Attending Physician (Gastroenterology) Shea Evans [name], LCSW as [facility] (Licensed Clinical Social Worker)   Date of Service:  11/03/2017   CHIEF COMPLAINTS:  Follow up gastric cancer   SUMMARY OF ONCOLOGIC HISTORY: Oncology History   Cancer Staging Malignant neoplasm of body of stomach ([place]) Staging form: Stomach, AJCC 8th Edition - Clinical stage from 03/11/2017: Stage IIB (cT3, cN0, cM0) - Unsigned - Pathologic stage from 04/22/2017: Stage IIIB (pT4a, pN3a, cM0) - Signed by Alla Feeling, NP on 05/17/2017       Malignant neoplasm of body of stomach ([place])   02/17/2017 Initial Diagnosis    Malignant neoplasm of body of stomach ([place])      02/17/2017 Pathology Results    Diagnosis Stomach, biopsy, gastric ulcer - ADENOCARCINOMA. Microscopic Comment Several of the fragments are involved by moderately differentiated adenocarcinoma.      02/17/2017 Procedure    UPPER ENDOSCOPY  FINDINGS - Normal esophagus. - Z-line irregular, 44 cm from the incisors. - Red blood in the gastric body and in the gastric antrum. - Large gastric ulcer. Biopsied. - Erythematous mucosa in the antrum. - Normal cardia, gastric fundus, gastric body and pylorus. - Normal duodenal bulb and second portion of the duodenum. Comment: Endoscopic appearence concerning for malignant ulcer.      03/11/2017 Procedure    UPPER EUS PER DR. Ardis Hughs Findings: 1. The esophagus was normal. 2. There was a 2-3cm ulcerated, malignant mass along the greater curvature  of the stomach, approximately mid-body. The mass was non-circumferential. 3. The duodenum was normal. Endosonographic Finding 1. The gastric mass above correlated with a hypoechoic and heterogenous non-circumferential mass that measured 2.9cm across, 64m deep. The endosonographic borders were poorly defined and there was clear sonographic evidence suggesting invasion into and through the muscularis propria layer without evident invasion into nearby organs (uT3). 2. The duodenal lymphnode described on recent CT scan appeared reactive by [place]. 3. No perigastric adenopathy (uN0) 4. Limited views of the liver, spleen, pancreas, bile duct, gallbladder were all normal. - Along the greater curvature of the stomach, approximately mid-body, there is a 2.9cm uT3N0 (clinical stage IIB) gastric adenocarcinoma.      04/06/2017 Imaging    CT CHEST IMPRESSION: 1. No acute cardiopulmonary abnormalities. 2. Two small pulmonary nodules are noted measuring up to 5 mm. Nonspecific but warrant attention on follow-up imaging follow up 3. Nonspecific hyperdense, possibly enhancing lesion along the dome of liver is identified. In a patient that is at increased risk for more definitive assessment of this structure with contrast enhanced MRI of the liver is advised.      04/21/2017 Imaging    MR ABDOMEN IMPRESSION: 1. Enhancing 2.9 cm mass along the lesser curvature in the gastric antrum, compatible with known gastric malignancy . 2. Mildly enlarged gastrohepatic ligament lymph node, cannot exclude nodal metastasis. 3. No definite liver metastatic disease. Hyperenhancing 0.9 cm liver dome mass remains inconclusive, although the MRI features are most suggestive of a flash filling hemangioma, and the mass has been stable for nearly 2 months. Additional subcentimeter focus of hyperenhancement in the lateral segment left liver  lobe is most likely a benign transient vascular phenomenon. Recommend  attention to these lesions on a follow-up MRI abdomen without and with IV contrast in 3-6 months. This recommendation follows ACR consensus guidelines: Management of Incidental Liver Lesions on CT: A White Paper of the ACR Incidental Findings Committee. J Am Coll Radiol 2017; 93:2355-7322. 4. Benign right adrenal adenoma.        04/22/2017 Pathology Results    Diagnosis 1. Liver, biopsy - BILE DUCT HAMARTOMA. - THERE IS NO EVIDENCE OF MALIGNANCY. 2. Lymph nodes, regional resection, portal - METASTATIC CARCINOMA IN 2 OF 6 LYMPH NODES (2/6). 3. Stomach, resection for tumor, distal - INVASIVE ADENOCARCINOMA, POORLY DIFFERENTIATED, SPANNING 3.8 CM. - PERINEURAL INVASION IS IDENTIFIED. - ADENOCARCINOMA INVOLVES THE SEROSA. - METASTATIC CARCINOMA IN 12 OF 30 LYMPH NODES (12/30), WITH EXTRACAPSULAR EXTENSION. - SEE ONCOLOGY TABLE BELOW. 4. Lymph node, biopsy, common hepatic artery - THERE IS NO EVIDENCE OF CARCINOMA IN 1 OF 1 LYMPH NODE (0/1). Microscopic Comment 3. STOMACH: Specimen: Stomach. Procedure: Partial gastrectomy. Tumor Site: Greater curvature. Tumor Size: 3.8 cm Histologic Type: Adenocarcinoma. Histologic Grade: G3: poorly differentiated. Microscopic Extent of Tumor: Adenocarcinoma involves the serosa. Margins (select all that apply): Adenoca Proximal Margin: Negative for adenocarcinoma. Distal Margin: Negative for adenocarcinoma. Treatment Effect: N/A Lymph-Vascular Invasion: Not identified. Perineural Invasion: Present. Additional findings: Chronic gastritis. Ancillary testing: Can be performed upon clinician request. 1 of 3 Duplicate copy FINAL for MURRELL, DOME ([id number]) Microscopic Comment(continued) Lymph nodes: number examined - 37; number positive: 14 Pathologic Staging: pT4a, pN3a (JBK:gt, 04/27/17)      06/02/2017 - 09/30/2017 Adjuvant Chemotherapy    FOLFOX every 2 weeks, oxaliplatin stopped in March 2019 due to neuropathy, chemo stopped on  09/30/2017 per pt's request       HISTORY OF PRESENTING ILLNESS:  Gabriel Poole 72 y.o. male is here because of newly diagnosed gastric cancer. Initially, he began having symptoms of bilateral abdominal pain, bloating, and decreased appetite in 07/2016 that persisted for 1.5 months before seeking care with PCP. He reports 40 pounds weight loss over 4-5 months. Denies nausea, vomiting, constipation, diarrhea, GI bleeding, or early satiety. Then referred to gastroenterologist in [place]. Work up for gall stones was negative. He had good performance status and working at [employer]. EGD by Dr. Laural Golden noted ulcerated mass seen on the greater curvature. CT [facility] showed known malignancy in the antral region of the stomach and a single lymph node adjacent to the duodenal bulb. He was referred to Dr. Ardis Hughs for EUS which confirmed gastric mass spanning 2.9 cm across and 7 mm deep suggesting invasion into and through the muscularis propria without evidence of invasion into nearby organs (uT3); lymph node on CT appeared to be reactive (uN0).     He was referred to Dr. Barry Dienes for surgery consult. CT chest completed staging work up which indicated two non-specific pulmonary nodules and a non-specific, hyperdense, possibly enhancing lesion along the dome of the liver. F/u MR abdomen revealed no definitive liver metastasis, the hyperenhancing lesion is thought to be flash filling hemangioma. He then underwent laparoscopic diagnostic ERAS pathway with partial gastrectomy and jejunal feeding tube placement on 04/25/17. Surgical pathology revealed poorly differentiated adenocarcinoma of the stomach, spanning 3.8cm with perineural invasion and with involvement of the serosa. 12/30 lymph nodes were positive for metastatic carcinoma with extracapsular extension. 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 One lymph node  was also  surveyed along the common hepatic artery which was negative for carcinoma. He was then referred to oncology for further management.   In the past he was diagnosed with arthritis, heart murmur, HTN, hyperlipidemia, gout, hydrocele, and elevated PSA. Previous colonoscopy in 2015 was positive for sessile polyp but otherwise unremarkable and he is on 5-year follow up plan. He drinks one alcoholic drink daily. He smoked 0.5 PPD for 45 years, quit smoking before surgery 04/2017. Family history is positive for 2 maternal aunts diagnosed with leukemia. He is divorced and re-married, has 3 children who are healthy. He is accompanied by his ex-wife today.  Since his surgery he has recovered well. He uses J-tube occasionally but feeds make him nauseous; gets most nutrition and hydration by mouth. He last flushed tube 2 days ago, he has 8/10 left abdominal pain and is asking for refill of pain medication today, often increases to 8/10 at night. BMs are regular, no n/v. He is able to care for himself and complete ADLs independently. He does not drive, but has family who can transport him for appointments, he lives in [place], approx 30 minutes from [place].   CURRENT THERAPY:  Adjuvant FOLFOX q2 weeks started on 06/02/17. Reduced oxaliplatin starting with cycle 4 due to cold sensitivity and neuropathy. Stopped oxaliplatin from cycle 5 due to neuropathy.    INTERVAL HISTORY:  HARCE VOLDEN is here for follow up and cycle 8 chemotherapy. He presents to the clinic by himself. He notes his usual transportation is no longer running. He notes he has 2 teeth extracted Last week. He plans to get more teeth extractions. He notes he would like to return to work soon. He notes he would work 6 hours a day for 5 days.   On review of symptoms, pt notes on his back and stomach he has been itching. He denies seeing a rash. He denies fever or any concerning symptoms.    REVIEW OF SYSTEMS:    Constitutional: Denies  fever or chills  Eyes: Denies blurriness of vision Ears, nose, mouth, throat, and face: Denies mucositis Respiratory: Denies cough, dyspnea or wheezes Cardiovascular: Denies palpitation, chest discomfort or lower extremity swelling Gastrointestinal:  Denies nausea, vomiting, constipation, diarrhea, heartburn or change in bowel habits (+) feeding tube, clamped Skin: Denies abnormal skin rashes (+) skin itching on stomach and upper back.  Lymphatics: Denies new lymphadenopathy or easy bruising Neurological: Denies new weaknesses (+) cold sensitivity Behavioral/Psych: Mood is stable, no new changes  All other systems were reviewed with the patient and are negative.  MEDICAL HISTORY:  Past Medical History:  Diagnosis Date  . Arthritis   . Cancer [facility]) dx oct 02-2017   stomach  . Gout   . Heart murmur   . Hyperlipidemia   . Hypertension   . Stomach cancer [facility])     SURGICAL HISTORY: Past Surgical History:  Procedure Laterality Date  . COLONOSCOPY    . ESOPHAGOGASTRODUODENOSCOPY N/A 02/17/2017   Procedure: ESOPHAGOGASTRODUODENOSCOPY (EGD);  Surgeon: Rogene Houston, MD;  [facility];  Service: Endoscopy;  Laterality: N/A;  3:00  . EUS N/A 03/11/2017   Procedure: UPPER ENDOSCOPIC ULTRASOUND (EUS) LINEAR;  Surgeon: Milus Banister, MD;  Location: [facility];  Service: Endoscopy;  Laterality: N/A;  . EUS N/A 03/11/2017   Procedure: UPPER ENDOSCOPIC ULTRASOUND (EUS) RADIAL;  Surgeon: Milus Banister, MD;  Location: [facility];  Service: Endoscopy;  Laterality: N/A;  . FINGER SURGERY     Lt middle   .  GASTRECTOMY N/A 04/22/2017   Procedure: PARTIAL GASTRECTOMY;  Surgeon: Stark Klein, MD;  Location: [facility];  Service: General;  Laterality: N/A;  . GASTROJEJUNOSTOMY N/A 04/22/2017   Procedure: JEJUNAL FEEDING TUBE PLACEMENT;  Surgeon: Stark Klein, MD;  Location: [facility];  Service: General;  Laterality: N/A;  . HYDROCELE EXCISION  02/12/2011   Procedure: HYDROCELECTOMY  ADULT;  Surgeon: Marissa Nestle;  Location: [facility];  Service: Urology;  Laterality: Left;  . LAPAROSCOPY N/A 04/22/2017   Procedure: LAPAROSCOPY DIAGNOSTIC ERAS PATHWAY;  Surgeon: Stark Klein, MD;  Location: [facility];  Service: General;  Laterality: N/A;  EPIDURAL  . PORTACATH PLACEMENT N/A 05/28/2017   Procedure: INSERTION PORT-A-CATH ERAS PATHWAY;  Surgeon: Stark Klein, MD;  Location: [facility];  Service: General;  Laterality: N/A;    I have reviewed the social history and family history with the patient and they are unchanged from previous note.  ALLERGIES:  is allergic to penicillins.  MEDICATIONS:  Current Outpatient Medications  Medication Sig Dispense Refill  . amLODipine (NORVASC) 10 MG tablet Take 1 tablet (10 mg total) by mouth daily. 90 tablet 0  . aspirin EC 81 MG tablet Take 81 mg by mouth daily.    Marland Kitchen atorvastatin (LIPITOR) 40 MG tablet Take 1 tablet (40 mg total) by mouth daily at 6 PM. 90 tablet 0  . metoprolol tartrate (LOPRESSOR) 50 MG tablet Take 50 mg by mouth 2 (two) times daily.    Marland Kitchen triamterene-hydrochlorothiazide (MAXZIDE-25) 37.5-25 MG tablet Take 1 tablet by mouth daily.    . chlorproMAZINE (THORAZINE) 25 MG tablet TAKE 1 TABLET BY MOUTH 3 TIMES DAILY AS NEEDED FOR HICCUPS (Patient not taking: Reported on 11/03/2017) 10 tablet 0  . Cholecalciferol (VITAMIN D3) 5000 units CAPS Take 5,000 Units by mouth daily.    Marland Kitchen lidocaine-prilocaine (EMLA) cream Apply to affected area once (Patient not taking: Reported on 11/03/2017) 30 g 3  . potassium chloride SA (K-DUR,KLOR-CON) 20 MEQ tablet Take 1 tablet (20 mEq total) by mouth 2 (two) times daily. (Patient not taking: Reported on 11/03/2017) 40 tablet 1  . ranitidine (ZANTAC) 300 MG tablet Take 300 mg by mouth at bedtime.     No current facility-administered medications for this visit.     PHYSICAL EXAMINATION: ECOG PERFORMANCE STATUS: 1 - Symptomatic but completely ambulatory BP (!) 155/84 (BP Location:  Right Arm, Patient Position: Sitting) Comment: Thu RN is aware  Pulse (!) 55 Comment: Thu RN is aware  Temp 97.8 F (36.6 C) (Oral)   Resp 17   Ht '5\' 10"'  (1.778 m)   Wt 153 lb 11.2 oz (69.7 kg)   SpO2 100%   BMI 22.05 kg/m   ENERAL:alert, no distress and comfortable SKIN: skin color, texture, turgor are normal, no rashes or significant lesions EYES: normal, Conjunctiva are pink and non-injected, sclera clear OROPHARYNX:no exudate, no erythema and lips, buccal mucosa, and tongue normal  NECK: supple, thyroid normal size, non-tender, without nodularity LYMPH:  no palpable cervical, supraclavicular, axillary, or inguinal lymphadenopathy LUNGS: clear to auscultation bilaterally with normal breathing effort HEART: regular rate & rhythm and no murmurs and no lower extremity edema ABDOMEN:abdomen soft, non-tender and normal bowel sounds. (+) vertical midline surgical incision is well healed (+) J tube to abdomen, clamped, site covered with gauze dressing c/d/i Musculoskeletal:no cyanosis of digits and no clubbing  NEURO: alert & oriented x 3 with fluent speech, no focal motor/sensory deficits PAC without erythema    LABORATORY DATA:  I have reviewed  the data as listed CBC Latest Ref Rng & Units 10/29/2017 09/30/2017 09/15/2017  WBC 4.0 - 10.3 K/uL 6.0 6.5 9.0  Hemoglobin 13.0 - 17.1 g/dL 12.4(L) 12.0(L) 12.5(L)  Hematocrit 38.4 - 49.9 % 38.4 37.9(L) 38.5  Platelets 140 - 400 K/uL 192 250 213     CMP Latest Ref Rng & Units 10/29/2017 09/30/2017 09/15/2017  Glucose 70 - 140 mg/dL 86 77 118  BUN 7 - 26 mg/dL '10 15 16  ' Creatinine 0.70 - 1.30 mg/dL 0.93 1.21 1.04  Sodium 136 - 145 mmol/L 143 142 139  Potassium 3.5 - 5.1 mmol/L 3.2(L) 3.6 3.4(L)  Chloride 98 - 109 mmol/L 108 109 107  CO2 22 - 29 mmol/L '27 27 27  ' Calcium 8.4 - 10.4 mg/dL 9.7 9.7 9.7  Total Protein 6.4 - 8.3 g/dL 6.4 6.6 6.2(L)  Total Bilirubin 0.2 - 1.2 mg/dL 0.7 0.5 0.5  Alkaline Phos 40 - 150 U/L 93 102 89  AST 5 - 34 U/L  '29 27 23  ' ALT 0 - 55 U/L 32 22 39     PATHOLOGY  Diagnosis10/10/18 Stomach, biopsy, gastric ulcer - ADENOCARCINOMA. Microscopic Comment Several of the fragments are involved by moderately differentiated adenocarcinoma.   Diagnosis12/13/18 1. Liver, biopsy - BILE DUCT HAMARTOMA. - THERE IS NO EVIDENCE OF MALIGNANCY. 2. Lymph nodes, regional resection, portal - METASTATIC CARCINOMA IN 2 OF 6 LYMPH NODES (2/6). 3. Stomach, resection for tumor, distal - INVASIVE ADENOCARCINOMA, POORLY DIFFERENTIATED, SPANNING 3.8 CM. - PERINEURAL INVASION IS IDENTIFIED. - ADENOCARCINOMA INVOLVES THE SEROSA. - METASTATIC CARCINOMA IN 12 OF 30 LYMPH NODES (12/30), WITH EXTRACAPSULAR EXTENSION. - SEE ONCOLOGY TABLE BELOW. 4. Lymph node, biopsy, common hepatic artery - THERE IS NO EVIDENCE OF CARCINOMA IN 1 OF 1 LYMPH NODE (0/1). Microscopic Comment 3. STOMACH: Specimen: Stomach. Procedure: Partial gastrectomy. Tumor Site: Greater curvature. Tumor Size: 3.8 cm Histologic Type: Adenocarcinoma. Histologic Grade: G3: poorly differentiated. Microscopic Extent of Tumor: Adenocarcinoma involves the serosa. Margins (select all that apply): Adenoca Proximal Margin: Negative for adenocarcinoma. Distal Margin: Negative for adenocarcinoma. Treatment Effect: N/A Lymph-Vascular Invasion: Not identified. Perineural Invasion: Present. Additional findings: Chronic gastritis. Ancillary testing: Can be performed upon clinician request. 1 of 3 Duplicate copy FINAL for AHMAN, DUGDALE ([id number]) Microscopic Comment(continued) Lymph nodes: number examined - 37; number positive: 14 Pathologic Staging: pT4a, pN3a (JBK:gt, 04/27/17)     RADIOGRAPHIC STUDIES: I have personally reviewed the radiological images as listed and agreed with the findings in the report. No results found.   ASSESSMENT & PLAN: This is a wonderful72 y.o.malewho presents into the clinic today to discuss the following:   1.  Primary adenocarcinoma of the pyloric antrum, pT4a, PN3aM0, stage IIIB, Grade 3, MSI-stable  -We previously reviewed imaging and surgical pathology with the patient and family in detail  -He underwent partial gastrostomy and lymph node D2 dissection with clear margins. -Due to his very high risk of cancer recurrence, I recommend him to have adjuvant chemotherapy to reduce his risk of recurrence. -Started adjuvant FOLFOX on 06/02/17.  -he is doing well, tolerating FOLFOX well. Will continue every 2 weeks, plan for a total of 12 cycles  -He developed significant cold sensitivity. I encouraged him to stay away from the cold and cold drinks and substances. He has also developed neuropathy in his feet.   -I previously recommend gabapentin, he tried 300 mg twice daily for a week, and stopped due to the lack of efficacy.  I recommend him to increase to 600 mg  at night, and continue 300 mg in the morning and early afternoon, but his not compliant -I previously prescribed Cymbalta but he declined  -I did reduce oxaliplatin dose on 07/14/17 with cycle 4 due to his neuropathy. I discontinued Oxaliplatin to prevent worsening of his neuropathy (most are cold sensitivity) on 07/28/17. He received 4 cycles of FOLFOX.  He agreed to continue 5-FU every 2 weeks. -His neuropathy has resolved -He has repeatedly requested to stop chemo earlier, he has difficulty to understand why he needs chemotherapy.  After several lengthy discussions, we decided to stop chemo after cycle 8  -He has recovered very well from chemotherapy, no residual neuropathy, still has mild fatigue, no pain or other complaints. -I reviewed his surveillance CT scan from October 29, 2017, which showed no evidence of recurrence.  He is clinically doing well, exam was unremarkable, no clinical concern for recurrence. -We again discussed the risk of recurrence, and I recommend him to continue surveillance, for total of 5 years.  He agrees. -I will see him back  every 3 to 4 months for the first 2 to 3 years, then every 6 months, plan to repeat scan every 6 to 12 months for the first 3 years.   2. Anemia  -Hgb 11.9 on 06/02/17, he was started on oral iron supplement -Improved, iron study close to normal, I recommend him to continue oral iron for additional 3 months.  3.  Cold sensitivity and mild peripheral neuropathy G1, body and feet pain, resolved now  -His cold sensitivity has gotten worse, he also developed some peripheral neuropathy, intermittent.  -I recommended him to try gabapentin, and increase dose -We will not refill his narcotics.  We discussed narcotics addiction.  Patient has been asking for narcotics repeatedly, but he does not appear to be in distress or pain, and his peripheral vibration sensation test was only mildly diminished. -I previously prescribed Cymbalta but he did not want to try.  -I advised him he can increase his dose of Neurontin to 600 mg at bedtime, and 300 mg in the morning and early afternoon -Discontinued Oxaliplatin as of 07/28/17.  His neuropathy has most resolved. -he has repeatedly ask narcotic pain medication for his pain in his feet that he has had prior to chemo. He previously received narcotics from his PCP Dr. Melina Copa. I offered him a referral to pain specialist and he declined. I will send a message to his PCP Dr. Melina Copa. Per patient, he has been taking pain medication from his friends lately. This is suspicious for narcotics addiction.  -I will not prescribe any pain medication and xanax.  -His neuropathy has resolved.  He again asked oxycodone prescription, because he feels better with oxycodone, with again discussed narcotic addiction, and I strongly recommend him to avoid any narcotics.  4. Insomnia  -he reports he has had trouble sleeping since starting chemo. He has been taking Xanax each night that he got from a friend.  -I advised him he can try melatonin or benadryl for sleep.    5. Hypokalemia  -K  is 2.9 today on 08/18/17, I previously prescribed K supplement  -His hypokalemia is likely related to his diuretics, I again encouraged him to resume his potassium supplement, and follow-up with his primary care physician.   6.  Asending aortic aneurysm -Found on CT scan, 4.2cm in CT 10/29/2017 -will defer to his PCP Dr. Melina Copa to see if he needs to see a vascular surgeon -This could be monitored on the subsequent  surveillance CT scan for his gastric cancer, we plan to obtain CT every 6 to 12 months in the next 2-3 years  7. Smoking Cessation -He notes he still smokes, but has cut down to half pack a day.  -I strongly recommend he stop smoking as this can increase risk of lung cancer.  He agrees to try -I encouraged him to participate the smoking cessation class at, house -Patient states he has stopped alcohol drinking since he has gastric surgery in December 2018.    PLAN -Lab and surveillance CT scan reviewed with patient, no evidence of recurrence. -I encouraged him to follow-up with his PCP Dr. Melina Copa for his ascending aortic aneurysm, blood pressure monitoring and management of hyperkalemia. -Lab and follow-up in 3 months.   No orders of the defined types were placed in this encounter.  All questions were answered. The patient knows to call the clinic with any problems, questions or concerns. No barriers to learning was detected.  I spent 20 minutes counseling the patient face to face. The total time spent in the appointment was 25 minutes and more than 50% was on counseling and review of test results  This document serves as a record of services personally performed by Truitt Merle, MD. It was created on her behalf by Joslyn Devon, a trained medical scribe. The creation of this record is based on the scribe's personal observations and the provider's statements to them.    I have reviewed the above documentation for accuracy and completeness, and I agree with the above.     Truitt Merle, MD 11/03/17

## 2017-11-03 NOTE — Telephone Encounter (Signed)
Scheduled appt per 6/26 los - gave patient AVS and calender per los.  

## 2017-11-09 ENCOUNTER — Other Ambulatory Visit: Payer: Self-pay

## 2017-11-09 ENCOUNTER — Telehealth: Payer: Self-pay

## 2017-11-09 ENCOUNTER — Emergency Department (HOSPITAL_COMMUNITY)
Admission: EM | Admit: 2017-11-09 | Discharge: 2017-11-09 | Disposition: A | Discharge status: Home / Self Care | Payer: Medicare HMO | Attending: Emergency Medicine | Admitting: Emergency Medicine

## 2017-11-09 ENCOUNTER — Encounter (HOSPITAL_COMMUNITY): Payer: Self-pay | Admitting: Emergency Medicine

## 2017-11-09 DIAGNOSIS — R319 Hematuria, unspecified: Secondary | ICD-10-CM | POA: Diagnosis not present

## 2017-11-09 DIAGNOSIS — I10 Essential (primary) hypertension: Secondary | ICD-10-CM | POA: Diagnosis not present

## 2017-11-09 DIAGNOSIS — K253 Acute gastric ulcer without hemorrhage or perforation: Secondary | ICD-10-CM | POA: Diagnosis not present

## 2017-11-09 DIAGNOSIS — C169 Malignant neoplasm of stomach, unspecified: Secondary | ICD-10-CM | POA: Diagnosis not present

## 2017-11-09 DIAGNOSIS — Z7982 Long term (current) use of aspirin: Secondary | ICD-10-CM | POA: Insufficient documentation

## 2017-11-09 DIAGNOSIS — Z85028 Personal history of other malignant neoplasm of stomach: Secondary | ICD-10-CM | POA: Diagnosis not present

## 2017-11-09 DIAGNOSIS — G629 Polyneuropathy, unspecified: Secondary | ICD-10-CM | POA: Diagnosis not present

## 2017-11-09 DIAGNOSIS — Z79899 Other long term (current) drug therapy: Secondary | ICD-10-CM | POA: Insufficient documentation

## 2017-11-09 DIAGNOSIS — F17210 Nicotine dependence, cigarettes, uncomplicated: Secondary | ICD-10-CM | POA: Insufficient documentation

## 2017-11-09 LAB — COMPREHENSIVE METABOLIC PANEL
ALBUMIN: 4.2 g/dL (ref 3.5–5.0)
ALK PHOS: 85 U/L (ref 38–126)
ALT: 39 U/L (ref 0–44)
AST: 33 U/L (ref 15–41)
Anion gap: 7 (ref 5–15)
BILIRUBIN TOTAL: 0.9 mg/dL (ref 0.3–1.2)
BUN: 13 mg/dL (ref 8–23)
CALCIUM: 9.4 mg/dL (ref 8.9–10.3)
CO2: 26 mmol/L (ref 22–32)
Chloride: 108 mmol/L (ref 98–111)
Creatinine, Ser: 1.14 mg/dL (ref 0.61–1.24)
GFR calc Af Amer: >60 mL/min (ref >60)
GFR calc non Af Amer: >60 mL/min (ref >60)
GLUCOSE: 92 mg/dL (ref 70–99)
POTASSIUM: 3 mmol/L — AB (ref 3.5–5.1)
Sodium: 141 mmol/L (ref 135–145)
TOTAL PROTEIN: 7.1 g/dL (ref 6.5–8.1)

## 2017-11-09 LAB — URINALYSIS, ROUTINE W REFLEX MICROSCOPIC
Glucose, UA: NEGATIVE mg/dL
NITRITE: NEGATIVE
PROTEIN: 100 mg/dL — AB
Specific Gravity, Urine: 1.025 (ref 1.005–1.030)
pH: 6.5 (ref 5.0–8.0)

## 2017-11-09 LAB — CBC WITH DIFFERENTIAL/PLATELET
BASOS PCT: 0 %
Basophils Absolute: 0 10*3/uL (ref 0.0–0.1)
Eosinophils Absolute: 0.1 10*3/uL (ref 0.0–0.7)
Eosinophils Relative: 1 %
HEMATOCRIT: 40 % (ref 39.0–52.0)
HEMOGLOBIN: 12.9 g/dL — AB (ref 13.0–17.0)
Lymphocytes Relative: 25 %
Lymphs Abs: 2.3 10*3/uL (ref 0.7–4.0)
MCH: 30.3 pg (ref 26.0–34.0)
MCHC: 32.3 g/dL (ref 30.0–36.0)
MCV: 93.9 fL (ref 78.0–100.0)
Monocytes Absolute: 0.5 10*3/uL (ref 0.1–1.0)
Monocytes Relative: 6 %
NEUTROS ABS: 6 10*3/uL (ref 1.7–7.7)
NEUTROS PCT: 68 %
Platelets: 217 10*3/uL (ref 150–400)
RBC: 4.26 MIL/uL (ref 4.22–5.81)
RDW: 13.8 % (ref 11.5–15.5)
WBC: 8.9 10*3/uL (ref 4.0–10.5)

## 2017-11-09 LAB — URINALYSIS, MICROSCOPIC (REFLEX): RBC / HPF: >50 RBC/hpf (ref 0–5)

## 2017-11-09 LAB — PSA: Prostatic Specific Antigen: 7.5 ng/mL — ABNORMAL HIGH (ref 0.00–4.00)

## 2017-11-09 NOTE — ED Notes (Signed)
MD at the bedside  

## 2017-11-09 NOTE — Telephone Encounter (Signed)
Patient's ex wife called stating he called her saying he was passing blood in his urine since last night.  I called patient and instructed him to go to the ED to be evaluated.  He verbalized an understanding.

## 2017-11-09 NOTE — ED Notes (Signed)
Pt states he just went to bathroom and voided and urine was clear. Wanting to get lab results and leave

## 2017-11-09 NOTE — ED Triage Notes (Signed)
Patient complaining of blood in urine since yesterday. Denies pain.

## 2017-11-10 LAB — PSA, TOTAL AND FREE
PROSTATE SPECIFIC AG, SERUM: 6.8 ng/mL — AB (ref 0.0–4.0)
PSA FREE PCT: 15.7 %
PSA, Free: 1.07 ng/mL

## 2017-11-10 NOTE — ED Provider Notes (Signed)
[facility] EMERGENCY DEPARTMENT Provider Note   CSN: [id number] Arrival date & time: 11/09/17  1621     History   Chief Complaint Chief Complaint  Patient presents with  . Hematuria    HPI Gabriel Poole is a 72 y.o. male.   Hematuria  This is a new problem. The current episode started yesterday. The problem occurs daily. The problem has been resolved. Pertinent negatives include no chest pain, no abdominal pain, no headaches and no shortness of breath. Nothing aggravates the symptoms. Nothing relieves the symptoms.    Past Medical History:  Diagnosis Date  . Arthritis   . Cancer [facility]) dx oct 02-2017   stomach  . Gout   . Heart murmur   . Hyperlipidemia   . Hypertension   . 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 Port-A-Cath in place 06/02/2017  . 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 Malignant neoplasm of body of stomach ([place])   . Abdominal pain, epigastric 02/01/2017  . 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 Pain of right lower leg 11/29/2014  . Elevated PSA, less than 10 ng/ml 10/31/2014  . Noncompliance 09/21/2012  . HTN (hypertension) 09/21/2012  . HLD (hyperlipidemia) 09/21/2012  . Gout 09/21/2012  . Hydrocele 12/17/2010  . Tobacco abuse 12/17/2010    Past Surgical History:  Procedure Laterality Date  . COLONOSCOPY    . ESOPHAGOGASTRODUODENOSCOPY N/A 02/17/2017   Procedure: ESOPHAGOGASTRODUODENOSCOPY (EGD);  Surgeon: Rogene Houston, MD;  [facility];  Service: Endoscopy;  Laterality: N/A;  3:00  . EUS N/A 03/11/2017   Procedure: UPPER ENDOSCOPIC ULTRASOUND (EUS) LINEAR;  Surgeon: Milus Banister, MD;  Location: [facility] ENDOSCOPY;  Service: Endoscopy;  Laterality: N/A;  . EUS N/A 03/11/2017   Procedure: UPPER ENDOSCOPIC ULTRASOUND (EUS) RADIAL;  Surgeon: Milus Banister, MD;  Location: [facility]  ENDOSCOPY;  Service: Endoscopy;  Laterality: N/A;  . FINGER SURGERY     Lt middle   . GASTRECTOMY N/A 04/22/2017   Procedure: PARTIAL GASTRECTOMY;  Surgeon: Stark Klein, MD;  Location: [place];  Service: General;  Laterality: N/A;  . GASTROJEJUNOSTOMY N/A 04/22/2017   Procedure: JEJUNAL FEEDING TUBE PLACEMENT;  Surgeon: Stark Klein, MD;  Location: [place];  Service: General;  Laterality: N/A;  . HYDROCELE EXCISION  02/12/2011   Procedure: HYDROCELECTOMY ADULT;  Surgeon: Marissa Nestle;  Location: [facility] ORS;  Service: Urology;  Laterality: Left;  . LAPAROSCOPY N/A 04/22/2017   Procedure: LAPAROSCOPY DIAGNOSTIC ERAS PATHWAY;  Surgeon: Stark Klein, MD;  Location: [place];  Service: General;  Laterality: N/A;  EPIDURAL  . PORTACATH PLACEMENT N/A 05/28/2017   Procedure: INSERTION PORT-A-CATH ERAS PATHWAY;  Surgeon: Stark Klein, MD;  Location: [place];  Service: General;  Laterality: N/A;        Home Medications    Prior to Admission medications   Medication Sig Start Date End Date Taking? Authorizing Provider  amLODipine (NORVASC) 10 MG tablet Take 1 tablet (10 mg total) by mouth daily. 12/04/15  Yes Claretta Fraise, MD  aspirin EC 81 MG tablet Take 81 mg by mouth daily.   Yes [provider]  atorvastatin (LIPITOR) 40 MG tablet Take 1 tablet (40 mg total) by mouth daily at 6 PM. 05/08/15  Yes Dettinger, Fransisca Kaufmann, MD  Cholecalciferol (VITAMIN D3) 5000 units CAPS Take 5,000 Units by mouth  daily.   Yes [provider]  metoprolol tartrate (LOPRESSOR) 50 MG tablet Take 50 mg by mouth 2 (two) times daily.   Yes [provider]  potassium chloride SA (K-DUR,KLOR-CON) 20 MEQ tablet Take 1 tablet (20 mEq total) by mouth 2 (two) times daily. Patient taking differently: Take 20 mEq by mouth daily.  08/18/17  Yes Truitt Merle, MD  triamterene-hydrochlorothiazide (MAXZIDE-25) 37.5-25 MG tablet Take 1 tablet by mouth daily.   Yes [provider]     Family History Family History  Problem Relation Age of Onset  . Hypertension Mother   . Heart attack Father   . Cancer Maternal Aunt        leukemia  . Cancer Maternal Aunt        leukemia  . Hypotension Neg Hx   . Anesthesia problems Neg Hx   . Malignant hyperthermia Neg Hx   . Pseudochol deficiency Neg Hx     Social History Social History   Tobacco Use  . Smoking status: Current Some Day Smoker    Packs/day: 0.50    Years: 45.00    Pack years: 22.50    Types: Cigarettes, Cigars    Last attempt to quit: 04/2017    Years since quitting: 0.5  . Smokeless tobacco: Never Used  . Tobacco comment: used to smoke 1 pack a day   Substance Use Topics  . Alcohol use: Yes    Alcohol/week: 0.0 oz    Comment: Brandy daily, stopped in Dec 2018  . Drug use: No     Allergies   Penicillins   Review of Systems Review of Systems  Respiratory: Negative for shortness of breath.   Cardiovascular: Negative for chest pain.  Gastrointestinal: Negative for abdominal pain.  Genitourinary: Positive for hematuria.  Neurological: Negative for headaches.  All other systems reviewed and are negative.    Physical Exam Updated Vital Signs BP (!) 155/85   Pulse 63   Temp 97.9 F (36.6 C) (Oral)   Resp 18   Ht 5\' 10"  (1.778 m)   Wt 68 kg (150 lb)   SpO2 100%   BMI 21.52 kg/m   Physical Exam  Constitutional: He is oriented to person, place, and time. He appears well-developed and well-nourished.  HENT:  Head: Normocephalic and atraumatic.  Eyes: Conjunctivae and EOM are normal.  Neck: Normal range of motion.  Cardiovascular: Normal rate.  Pulmonary/Chest: Effort normal. No respiratory distress.  Abdominal: Soft. He exhibits no distension. There is no tenderness. There is no guarding.  Musculoskeletal: Normal range of motion.  Neurological: He is alert and oriented to person, place, and time.  Nursing note and vitals reviewed.    ED Treatments / Results  Labs (all  labs ordered are listed, but only abnormal results are displayed) Labs Reviewed  URINALYSIS, ROUTINE W REFLEX MICROSCOPIC - Abnormal; Notable for the following components:      Result Value   Color, Urine BROWN (*)    APPearance HAZY (*)    Hgb urine dipstick LARGE (*)    Bilirubin Urine SMALL (*)    Ketones, ur TRACE (*)    Protein, ur 100 (*)    Leukocytes, UA TRACE (*)    All other components within normal limits  CBC WITH DIFFERENTIAL/PLATELET - Abnormal; Notable for the following components:   Hemoglobin 12.9 (*)    All other components within normal limits  COMPREHENSIVE METABOLIC PANEL - Abnormal; Notable for the following components:   Potassium 3.0 (*)  All other components within normal limits  PSA - Abnormal; Notable for the following components:   Prostatic Specific Antigen 7.50 (*)    All other components within normal limits  PSA, TOTAL AND FREE - Abnormal; Notable for the following components:   Prostate Specific Ag, Serum 6.8 (*)    All other components within normal limits  URINALYSIS, MICROSCOPIC (REFLEX) - Abnormal; Notable for the following components:   Bacteria, UA RARE (*)    All other components within normal limits  URINE CULTURE    EKG None  Radiology No results found.  Procedures Procedures (including critical care time)  Medications Ordered in ED Medications - No data to display   Initial Impression / Assessment and Plan / ED Course  I have reviewed the triage vital signs and the nursing notes.  Pertinent labs & imaging results that were available during my care of the patient were reviewed by me and considered in my medical decision making (see chart for details).     Unclear the cause of his hematuria.  One concern would be prostate versus bladder neoplasm.  He did just have a CT scan done a couple weeks ago which did not show any abnormalities in his bladder but his prostate was quite enlarged.  Patient states he has seen a urologist  in the past but this just started recently so he was told to come here.  Urine is not infected, culture sent.  PSA added on the labs as the patient can follow-up with his urologist this week.  No indication for further work-up or management in the emergency room.  Patient stable for discharge  Final Clinical Impressions(s) / ED Diagnoses   Final diagnoses:  Hematuria, unspecified type    ED Discharge Orders    None       Lillyana Majette, Corene Cornea, MD 11/10/17 1459

## 2017-11-11 LAB — URINE CULTURE

## 2017-11-15 DIAGNOSIS — H547 Unspecified visual loss: Secondary | ICD-10-CM | POA: Diagnosis not present

## 2017-11-15 DIAGNOSIS — T63484A Toxic effect of venom of other arthropod, undetermined, initial encounter: Secondary | ICD-10-CM | POA: Diagnosis not present

## 2017-11-15 DIAGNOSIS — L299 Pruritus, unspecified: Secondary | ICD-10-CM | POA: Diagnosis not present

## 2017-11-16 ENCOUNTER — Other Ambulatory Visit: Payer: Self-pay | Admitting: Licensed Clinical Social Worker

## 2017-11-16 NOTE — Patient Outreach (Signed)
Assessment:  CSW was unable to talk via phone with client on 11/16/17 at phone number of client ([phone]).  CSW then called Gabriel Poole, ex-wife of client on 11/16/17.  CSW spoke via phone with Gabriel Poole. CSW verified identity of Gabriel Poole. CSW received verbal permission from Harlon Kutner in 11/16/17 for CSW to talk with Gabriel Poole about client needs. Gabriel Poole reported to [place] that client was residing at home of client. Client has completed scheduled chemotherapy treatments for client.  CSW spoke with Gabriel Poole about client care plan. CSW encouraged that client communicate with CSW in next 30 days to discuss transportation resources for client in the area. Client could benefit from use of transport services in area to help client go to and from client's scheduled medical appointments. 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 CSW also spoke with Gabriel Poole about [employer] transport resources for client. Client could call [employer] at [phone] to talk with representative at [facility] about transport benefits for client through [employer].  CSW thanked Gabriel Poole for phone call with CSW on 11/16/17. CSW encouraged that client or Gabriel Poole call CSW at [phone] as needed to discuss social work needs of client.  Plan:  Client to communicate with CSW in next 30 days to discuss transportation resources for client in the area.    CSW to call client or Stefanos Haynesworth in 4 weeks to assess client needs at that time.  [name].Patrice Matthew MSW, LCSW Licensed Clinical Social Worker [facility] Care Management [phone]

## 2017-11-17 DIAGNOSIS — Z87448 Personal history of other diseases of urinary system: Secondary | ICD-10-CM | POA: Diagnosis not present

## 2017-12-16 ENCOUNTER — Other Ambulatory Visit: Payer: Self-pay | Admitting: Licensed Clinical Social Worker

## 2017-12-16 ENCOUNTER — Encounter: Payer: Self-pay | Admitting: Licensed Clinical Social Worker

## 2017-12-16 DIAGNOSIS — M5430 Sciatica, unspecified side: Secondary | ICD-10-CM | POA: Diagnosis not present

## 2017-12-16 NOTE — Patient Outreach (Signed)
Assessment:  CSW spoke via phone with client. CSW verified client identity. CSW received verbal permission from client for CSW to speak with client about client needs. CSW spoke with client about client care plan. CSW informed client of transport resource through [place]. CSW informed client of transport resources through [employer] with [employer] support. CSW also informed client that client had now met his [facility] CSW care plan goal and that [place] would discharge client from [place] services on 12/16/17.  Client understood information and agreed with this plan.  CSW thanked client and Pradyun Ishman for communicating with CSW to discuss client needs in recent months. CSW encouraged client to attend scheduled client medical appointments.   Plan:  CSW is discharging Gabriel Poole on 12/16/17 from [place] services since client has met care plan goal with CSW services.  CSW to fax physician case closure letter to Dr. Melina Copa informing Dr. Melina Copa that [place] discharged client on 12/16/17 from [facility] CSW services.  [name].Zonia Caplin MSW, LCSW Licensed Clinical Social Worker [facility] Care Management [phone]

## 2018-01-24 DIAGNOSIS — R972 Elevated prostate specific antigen [PSA]: Secondary | ICD-10-CM | POA: Diagnosis not present

## 2018-01-24 DIAGNOSIS — R3914 Feeling of incomplete bladder emptying: Secondary | ICD-10-CM | POA: Diagnosis not present

## 2018-02-03 ENCOUNTER — Encounter: Payer: Self-pay | Admitting: Nurse Practitioner

## 2018-02-03 ENCOUNTER — Inpatient Hospital Stay: Payer: Medicare HMO

## 2018-02-03 ENCOUNTER — Inpatient Hospital Stay: Payer: Medicare HMO | Attending: Hematology | Admitting: Nurse Practitioner

## 2018-02-03 VITALS — BP 129/81 | HR 55 | Temp 97.7°F | Resp 17 | Ht 70.0 in | Wt 151.1 lb

## 2018-02-03 DIAGNOSIS — N400 Enlarged prostate without lower urinary tract symptoms: Secondary | ICD-10-CM | POA: Insufficient documentation

## 2018-02-03 DIAGNOSIS — C162 Malignant neoplasm of body of stomach: Secondary | ICD-10-CM

## 2018-02-03 DIAGNOSIS — I712 Thoracic aortic aneurysm, without rupture: Secondary | ICD-10-CM | POA: Diagnosis not present

## 2018-02-03 DIAGNOSIS — Z9221 Personal history of antineoplastic chemotherapy: Secondary | ICD-10-CM | POA: Diagnosis not present

## 2018-02-03 DIAGNOSIS — G4700 Insomnia, unspecified: Secondary | ICD-10-CM

## 2018-02-03 DIAGNOSIS — Z85028 Personal history of other malignant neoplasm of stomach: Secondary | ICD-10-CM | POA: Diagnosis not present

## 2018-02-03 DIAGNOSIS — R42 Dizziness and giddiness: Secondary | ICD-10-CM | POA: Diagnosis not present

## 2018-02-03 DIAGNOSIS — E876 Hypokalemia: Secondary | ICD-10-CM | POA: Diagnosis not present

## 2018-02-03 DIAGNOSIS — R972 Elevated prostate specific antigen [PSA]: Secondary | ICD-10-CM | POA: Diagnosis not present

## 2018-02-03 DIAGNOSIS — F17210 Nicotine dependence, cigarettes, uncomplicated: Secondary | ICD-10-CM | POA: Diagnosis not present

## 2018-02-03 DIAGNOSIS — Z95828 Presence of other vascular implants and grafts: Secondary | ICD-10-CM

## 2018-02-03 DIAGNOSIS — D649 Anemia, unspecified: Secondary | ICD-10-CM | POA: Diagnosis not present

## 2018-02-03 LAB — CBC WITH DIFFERENTIAL (CANCER CENTER ONLY)
BASOS ABS: 0.1 10*3/uL (ref 0.0–0.1)
Basophils Relative: 1 %
Eosinophils Absolute: 0.1 10*3/uL (ref 0.0–0.5)
Eosinophils Relative: 2 %
HEMATOCRIT: 39.4 % (ref 38.4–49.9)
Hemoglobin: 12.8 g/dL — ABNORMAL LOW (ref 13.0–17.1)
LYMPHS ABS: 1.5 10*3/uL (ref 0.9–3.3)
LYMPHS PCT: 25 %
MCH: 29.4 pg (ref 27.2–33.4)
MCHC: 32.5 g/dL (ref 32.0–36.0)
MCV: 90.4 fL (ref 79.3–98.0)
Monocytes Absolute: 0.4 10*3/uL (ref 0.1–0.9)
Monocytes Relative: 7 %
NEUTROS ABS: 4 10*3/uL (ref 1.5–6.5)
Neutrophils Relative %: 65 %
Platelet Count: 196 10*3/uL (ref 140–400)
RBC: 4.36 MIL/uL (ref 4.20–5.82)
RDW: 14 % (ref 11.0–14.6)
WBC Count: 6.1 10*3/uL (ref 4.0–10.3)

## 2018-02-03 LAB — IRON AND TIBC
Iron: 46 ug/dL (ref 42–163)
SATURATION RATIOS: 15 % — AB (ref 42–163)
TIBC: 298 ug/dL (ref 202–409)
UIBC: 252 ug/dL

## 2018-02-03 LAB — CMP (CANCER CENTER ONLY)
ALBUMIN: 3.9 g/dL (ref 3.5–5.0)
ALT: 46 U/L — ABNORMAL HIGH (ref 0–44)
ANION GAP: 8 (ref 5–15)
AST: 29 U/L (ref 15–41)
Alkaline Phosphatase: 109 U/L (ref 38–126)
BUN: 14 mg/dL (ref 8–23)
CHLORIDE: 109 mmol/L (ref 98–111)
CO2: 27 mmol/L (ref 22–32)
Calcium: 9.2 mg/dL (ref 8.9–10.3)
Creatinine: 0.99 mg/dL (ref 0.61–1.24)
GFR, Est AFR Am: >60 mL/min (ref >60)
GFR, Estimated: >60 mL/min (ref >60)
Glucose, Bld: 83 mg/dL (ref 70–99)
POTASSIUM: 3.5 mmol/L (ref 3.5–5.1)
SODIUM: 144 mmol/L (ref 135–145)
TOTAL PROTEIN: 6.5 g/dL (ref 6.5–8.1)
Total Bilirubin: 0.6 mg/dL (ref 0.3–1.2)

## 2018-02-03 LAB — FERRITIN: FERRITIN: 82 ng/mL (ref 24–336)

## 2018-02-03 MED ORDER — HEPARIN SOD (PORK) LOCK FLUSH 100 UNIT/ML IV SOLN
500.0000 [IU] | Freq: Once | INTRAVENOUS | Status: AC | PRN
Start: 2018-02-03 — End: 2018-02-03
  Administered 2018-02-03: 500 [IU] via INTRAVENOUS
  Filled 2018-02-03: qty 5

## 2018-02-03 MED ORDER — SODIUM CHLORIDE 0.9% FLUSH
10.0000 mL | INTRAVENOUS | Status: DC | PRN
  Administered 2018-02-03: 10 mL via INTRAVENOUS
  Filled 2018-02-03: qty 10

## 2018-02-03 NOTE — Progress Notes (Signed)
[place]  Telephone:([phone]) [phone] Fax:([phone]) [phone]  Clinic Follow up Note   Patient Care Team: Octavio Graves, DO as PCP - General Truitt Merle, MD as Consulting Physician (Hematology) Alla Feeling, NP as Nurse Practitioner (Nurse Practitioner) Stark Klein, MD as Consulting Physician (General Surgery) Rogene Houston, MD as Consulting Physician (Gastroenterology) Milus Banister, MD as Attending Physician (Gastroenterology) 02/03/2018  SUMMARY OF ONCOLOGIC HISTORY: Oncology History   Cancer Staging Malignant neoplasm of body of stomach [facility]) Staging form: Stomach, AJCC 8th Edition - Clinical stage from 03/11/2017: Stage IIB (cT3, cN0, cM0) - Unsigned - Pathologic stage from 04/22/2017: Stage IIIB (pT4a, pN3a, cM0) - Signed by Alla Feeling, NP on 05/17/2017       Malignant neoplasm of body of stomach ([place])   02/17/2017 Initial Diagnosis    Malignant neoplasm of body of stomach ([place])    02/17/2017 Pathology Results    Diagnosis Stomach, biopsy, gastric ulcer - ADENOCARCINOMA. Microscopic Comment Several of the fragments are involved by moderately differentiated adenocarcinoma.    02/17/2017 Procedure    UPPER ENDOSCOPY  FINDINGS - Normal esophagus. - Z-line irregular, 44 cm from the incisors. - Red blood in the gastric body and in the gastric antrum. - Large gastric ulcer. Biopsied. - Erythematous mucosa in the antrum. - Normal cardia, gastric fundus, gastric body and pylorus. - Normal duodenal bulb and second portion of the duodenum. Comment: Endoscopic appearence concerning for malignant ulcer.    03/11/2017 Procedure    UPPER EUS PER DR. Ardis Hughs Findings: 1. The esophagus was normal. 2. There was a 2-3cm ulcerated, malignant mass along the greater curvature of the stomach, approximately mid-body. The mass was non-circumferential. 3. The duodenum was normal. Endosonographic Finding 1. The gastric mass above correlated with a hypoechoic  and heterogenous non-circumferential mass that measured 2.9cm across, 37m deep. The endosonographic borders were poorly defined and there was clear sonographic evidence suggesting invasion into and through the muscularis propria layer without evident invasion into nearby organs (uT3). 2. The duodenal lymphnode described on recent CT scan appeared reactive by [place]. 3. No perigastric adenopathy (uN0) 4. Limited views of the liver, spleen, pancreas, bile duct, gallbladder were all normal. - Along the greater curvature of the stomach, approximately mid-body, there is a 2.9cm uT3N0 (clinical stage IIB) gastric adenocarcinoma.    04/06/2017 Imaging    CT CHEST IMPRESSION: 1. No acute cardiopulmonary abnormalities. 2. Two small pulmonary nodules are noted measuring up to 5 mm. Nonspecific but warrant attention on follow-up imaging follow up 3. Nonspecific hyperdense, possibly enhancing lesion along the dome of liver is identified. In a patient that is at increased risk for more definitive assessment of this structure with contrast enhanced MRI of the liver is advised.    04/21/2017 Imaging    MR ABDOMEN IMPRESSION: 1. Enhancing 2.9 cm mass along the lesser curvature in the gastric antrum, compatible with known gastric malignancy . 2. Mildly enlarged gastrohepatic ligament lymph node, cannot exclude nodal metastasis. 3. No definite liver metastatic disease. Hyperenhancing 0.9 cm liver dome mass remains inconclusive, although the MRI features are most suggestive of a flash filling hemangioma, and the mass has been stable for nearly 2 months. Additional subcentimeter focus of hyperenhancement in the lateral segment left liver lobe is most likely a benign transient vascular phenomenon. Recommend attention to these lesions on a follow-up MRI abdomen without and with IV contrast in 3-6 months. This recommendation follows ACR consensus guidelines: Management of Incidental Liver Lesions on  CT: A White Paper of the ACR Incidental Findings Committee. J Am Coll Radiol 2017; 28:3151-7616. 4. Benign right adrenal adenoma.      04/22/2017 Pathology Results    Diagnosis 1. Liver, biopsy - BILE DUCT HAMARTOMA. - THERE IS NO EVIDENCE OF MALIGNANCY. 2. Lymph nodes, regional resection, portal - METASTATIC CARCINOMA IN 2 OF 6 LYMPH NODES (2/6). 3. Stomach, resection for tumor, distal - INVASIVE ADENOCARCINOMA, POORLY DIFFERENTIATED, SPANNING 3.8 CM. - PERINEURAL INVASION IS IDENTIFIED. - ADENOCARCINOMA INVOLVES THE SEROSA. - METASTATIC CARCINOMA IN 12 OF 30 LYMPH NODES (12/30), WITH EXTRACAPSULAR EXTENSION. - SEE ONCOLOGY TABLE BELOW. 4. Lymph node, biopsy, common hepatic artery - THERE IS NO EVIDENCE OF CARCINOMA IN 1 OF 1 LYMPH NODE (0/1). Microscopic Comment 3. STOMACH: Specimen: Stomach. Procedure: Partial gastrectomy. Tumor Site: Greater curvature. Tumor Size: 3.8 cm Histologic Type: Adenocarcinoma. Histologic Grade: G3: poorly differentiated. Microscopic Extent of Tumor: Adenocarcinoma involves the serosa. Margins (select all that apply): Adenoca Proximal Margin: Negative for adenocarcinoma. Distal Margin: Negative for adenocarcinoma. Treatment Effect: N/A Lymph-Vascular Invasion: Not identified. Perineural Invasion: Present. Additional findings: Chronic gastritis. Ancillary testing: Can be performed upon clinician request. 1 of 3 Duplicate copy FINAL for DEREK, LAUGHTER ([id number]) Microscopic Comment(continued) Lymph nodes: number examined - 37; number positive: 14 Pathologic Staging: pT4a, pN3a (JBK:gt, 04/27/17)    06/02/2017 - 09/30/2017 Adjuvant Chemotherapy    FOLFOX every 2 weeks, oxaliplatin stopped in March 2019 due to neuropathy, chemo stopped on 09/30/2017 per pt's request     10/29/2017 Imaging    10/29/2017 CT CAP  IMPRESSION: 1. Status post distal gastrectomy. No evidence for metastatic disease in the chest, abdomen, or pelvis. 2. Stable  tiny right pulmonary nodules. Continued attention on follow-up recommended. 3. 9 mm hypervascular lesion in the dome of the liver is stable over multiple studies back to 04/05/2017. Continued attention on follow-up suggested. 4. Ascending thoracic aorta measures 4.2 cm diameter. Recommend annual imaging followup by CTA or MRA. This recommendation follows 2010 ACCF/AHA/AATS/ACR/ASA/SCA/SCAI/SIR/STS/SVM Guidelines for the Diagnosis and Management of Patients with Thoracic Aortic Disease. Circulation. 2010; 121: R485-I627 . 5. Marked prostatomegaly. 6.  Aortic Atherosclerois (ICD10-170.0)   PRIOR THERAPY:  Adjuvant FOLFOX q2 weeks started on 06/02/17. Reduced oxaliplatin starting with cycle 4 due to cold sensitivity and neuropathy. Stopped oxaliplatin from cycle 5 due to neuropathy. S/p 8 cycles from 06/02/17 - 09/30/17   CURRENT THERAPY: surveillance   INTERVAL HISTORY: Mr. Tober returns for surveillance f/u as scheduled. He was last seen in 10/2017. He went to ED in 11/2017 for hematuria, urine culture was negative. PSA was elevated. He denies recurrent episodes. He has occasional dizziness, repots he went to a neurologist last week who attributes this to an enlarged prostate, saw urologist 2 weeks ago who confirms enlarged prostate. I do not have records of these visits. Dizziness occurs intermittently, sometimes at work. No syncope, cough, chest pain, dyspnea, or leg edema. He denies fatigue. Appetite is low, eats later in the day. Has intermittent diarrhea and gas that are diet-related. Hamburgers and ice cream trigger diarrhea. No blood in stool. Takes iron pill occasionally. Denies n/v/c. Denies abdominal pain. Neuropathy resolved after chemo.   MEDICAL HISTORY:  Past Medical History:  Diagnosis Date  . Arthritis   . Cancer [facility]) dx oct 02-2017   stomach  . Gout   . Heart murmur   . Hyperlipidemia   . Hypertension   . Stomach cancer [facility])     SURGICAL HISTORY: Past Surgical History:   Procedure  Laterality Date  . COLONOSCOPY    . ESOPHAGOGASTRODUODENOSCOPY N/A 02/17/2017   Procedure: ESOPHAGOGASTRODUODENOSCOPY (EGD);  Surgeon: Rogene Houston, MD;  [facility];  Service: Endoscopy;  Laterality: N/A;  3:00  . EUS N/A 03/11/2017   Procedure: UPPER ENDOSCOPIC ULTRASOUND (EUS) LINEAR;  Surgeon: Milus Banister, MD;  Location: [facility];  Service: Endoscopy;  Laterality: N/A;  . EUS N/A 03/11/2017   Procedure: UPPER ENDOSCOPIC ULTRASOUND (EUS) RADIAL;  Surgeon: Milus Banister, MD;  Location[facility];  Service: Endoscopy;  Laterality: N/A;  . FINGER SURGERY     Lt middle   . GASTRECTOMY N/A 04/22/2017   Procedure: PARTIAL GASTRECTOMY;  Surgeon: Stark Klein, MD;  Location: [place];  Service: General;  Laterality: N/A;  . GASTROJEJUNOSTOMY N/A 04/22/2017   Procedure: JEJUNAL FEEDING TUBE PLACEMENT;  Surgeon: Stark Klein, MD;  Location: [place];  Service: General;  Laterality: N/A;  . HYDROCELE EXCISION  02/12/2011   Procedure: HYDROCELECTOMY ADULT;  Surgeon: Marissa Nestle;  Location: [facility];  Service: Urology;  Laterality: Left;  . LAPAROSCOPY N/A 04/22/2017   Procedure: LAPAROSCOPY DIAGNOSTIC ERAS PATHWAY;  Surgeon: Stark Klein, MD;  Location: [place];  Service: General;  Laterality: N/A;  EPIDURAL  . PORTACATH PLACEMENT N/A 05/28/2017   Procedure: INSERTION PORT-A-CATH ERAS PATHWAY;  Surgeon: Stark Klein, MD;  Location: [place];  Service: General;  Laterality: N/A;    I have reviewed the social history and family history with the patient and they are unchanged from previous note.  ALLERGIES:  is allergic to penicillins.  MEDICATIONS:  Current Outpatient Medications  Medication Sig Dispense Refill  . amLODipine (NORVASC) 10 MG tablet Take 1 tablet (10 mg total) by mouth daily. 90 tablet 0  . atorvastatin (LIPITOR) 40 MG tablet Take 1 tablet (40 mg total) by mouth daily at 6 PM. 90 tablet 0  . Cholecalciferol (VITAMIN D3) 5000  units CAPS Take 5,000 Units by mouth daily.    . metoprolol tartrate (LOPRESSOR) 50 MG tablet Take 50 mg by mouth 2 (two) times daily.    . potassium chloride SA (K-DUR,KLOR-CON) 20 MEQ tablet Take 1 tablet (20 mEq total) by mouth 2 (two) times daily. (Patient taking differently: Take 20 mEq by mouth daily. ) 40 tablet 1  . triamterene-hydrochlorothiazide (MAXZIDE-25) 37.5-25 MG tablet Take 1 tablet by mouth daily.    Marland Kitchen aspirin EC 81 MG tablet Take 81 mg by mouth daily.     Current Facility-Administered Medications  Medication Dose Route Frequency Provider Last Rate Last Dose  . sodium chloride flush (NS) 0.9 % injection 10 mL  10 mL Intravenous PRN Truitt Merle, MD   10 mL at 02/03/18 0942    PHYSICAL EXAMINATION: ECOG PERFORMANCE STATUS: 0 - Asymptomatic  Vitals:   02/03/18 0945 02/03/18 0947  BP: (!) 143/74 129/81  Pulse: (!) 44 (!) 55  Resp:    Temp:    SpO2:     Filed Weights   02/03/18 0858  Weight: 151 lb 1.6 oz (68.5 kg)    GENERAL:alert, no distress and comfortable SKIN: no rashes or significant lesions EYES: sclera clear OROPHARYNX:no thrush or ulcers LYMPH:  no palpable cervical, supraclavicular, axillary, or inguinal lymphadenopathy LUNGS: clear to auscultation with normal breathing effort HEART: regular rate & rhythm, no lower extremity edema ABDOMEN:abdomen soft, non-tender and normal bowel sounds. Abdominal incisions are well healed  Musculoskeletal:no cyanosis of digits and no clubbing  NEURO: alert & oriented x 3 with fluent speech,  no focal motor/sensory deficits PAC without erythema   LABORATORY DATA:  I have reviewed the data as listed CBC Latest Ref Rng & Units 02/03/2018 11/09/2017 10/29/2017  WBC 4.0 - 10.3 K/uL 6.1 8.9 6.0  Hemoglobin 13.0 - 17.1 g/dL 12.8(L) 12.9(L) 12.4(L)  Hematocrit 38.4 - 49.9 % 39.4 40.0 38.4  Platelets 140 - 400 K/uL 196 217 192     CMP Latest Ref Rng & Units 02/03/2018 11/09/2017 10/29/2017  Glucose 70 - 99 mg/dL 83 92 86  BUN 8  - 23 mg/dL '14 13 10  ' Creatinine 0.61 - 1.24 mg/dL 0.99 1.14 0.93  Sodium 135 - 145 mmol/L 144 141 143  Potassium 3.5 - 5.1 mmol/L 3.5 3.0(L) 3.2(L)  Chloride 98 - 111 mmol/L 109 108 108  CO2 22 - 32 mmol/L '27 26 27  ' Calcium 8.9 - 10.3 mg/dL 9.2 9.4 9.7  Total Protein 6.5 - 8.1 g/dL 6.5 7.1 6.4  Total Bilirubin 0.3 - 1.2 mg/dL 0.6 0.9 0.7  Alkaline Phos 38 - 126 U/L 109 85 93  AST 15 - 41 U/L 29 33 29  ALT 0 - 44 U/L 46(H) 39 32     RADIOGRAPHIC STUDIES: I have personally reviewed the radiological images as listed and agreed with the findings in the report. No results found.   ASSESSMENT & PLAN: This is a 72y.o.malewho presents into the clinic today to discuss the following:   1. Primary adenocarcinoma of the pyloric antrum, pT4a, PN3aM0, stage IIIB, Grade3, MSI-stable  -Mr. Sippel is clinically doing well. Physical exam is unremarkable. Labs are stable. No clinical concern for recurrence.  -He continues to have intermittent diarrhea since surgery and chemo, he regulates this with his diet.  -We reviewed signs and symptoms of recurrence  -will repeat surveillance scan in 3-4 months before next f/u with Dr. Burr Medico  2. Anemia  -Hgb 12.8, iron studies are in low-normal range; he was previously started on oral iron supplement which he takes intermittently.  -I recommend he take 1 tab consistently daily   3.  Cold sensitivity and mild peripheral neuropathy G1, body and feet pain -Discontinued Oxaliplatin as of 07/28/17.   -Resolved after chemo was discontinued   4. Insomnia   5. Hypokalemia  -previously prescribed K supplement  -His hypokalemia is likely related to his diuretics and intermittent diarrhea.  -I recommend he continue 1 tab daily   6.  Asending aortic aneurysm -Found on CT scan, 4.2cm in CT 10/29/2017 -monitored on surveillance scans for his gastric cancer   7. Smoking Cessation  8. Intermittent dizziness  -He reports he saw neurology and this is  attributed to enlarged prostate  -he does not have significant orthostatic hypotension in clinic today.  -I recommend he remain adequately hydrated, and f/u with PCP or his neurologist    PLAN -Labs reviewed, no clinical concern for recurrence -Surveillance CT and f/u in 3-4 months, ordered today -Resume iron tab daily  -PAC flush today, will discuss with Dr. Barry Dienes about removing -no significant orthostatics hypotension today, increase hydration    Orders Placed This Encounter  Procedures  . CT Abdomen Pelvis W Contrast    Standing Status:   Future    Standing Expiration Date:   02/03/2019    Order Specific Question:   ** REASON FOR EXAM (FREE TEXT)    Answer:   surveillance, stage 3 stomach cancer s/p surgery, chemotherapy    Order Specific Question:   If indicated for the ordered procedure, I authorize the administration  of contrast media per Radiology protocol    Answer:   Yes    Order Specific Question:   Preferred imaging location?    Answer:   [facility]    Order Specific Question:   Is Oral Contrast requested for this exam?    Answer:   Yes, Per Radiology protocol    Order Specific Question:   Radiology Contrast Protocol - do NOT remove file path    Answer:   \\[email or web address]  . CT Chest W Contrast    Standing Status:   Future    Standing Expiration Date:   02/03/2019    Order Specific Question:   ** REASON FOR EXAM (FREE TEXT)    Answer:   surveillance, stage 3 stomach cancer s/p surgery and chemotherapy    Order Specific Question:   If indicated for the ordered procedure, I authorize the administration of contrast media per Radiology protocol    Answer:   Yes    Order Specific Question:   Preferred imaging location?    Answer:   [facility]    Order Specific Question:   Radiology Contrast Protocol - do NOT remove file path    Answer:   \\[email or web address]   All questions were answered. The  patient knows to call the clinic with any problems, questions or concerns. No barriers to learning was detected. I spent 20 minutes counseling the patient face to face. The total time spent in the appointment was 25 minutes and more than 50% was on counseling and review of test results     Alla Feeling, NP 02/03/18

## 2018-02-04 ENCOUNTER — Telehealth: Payer: Self-pay | Admitting: Hematology

## 2018-02-04 NOTE — Telephone Encounter (Signed)
Appt scheduled letter/calendar mailed per 9/26 los

## 2018-03-17 DIAGNOSIS — E559 Vitamin D deficiency, unspecified: Secondary | ICD-10-CM | POA: Diagnosis not present

## 2018-03-17 DIAGNOSIS — Z23 Encounter for immunization: Secondary | ICD-10-CM | POA: Diagnosis not present

## 2018-03-17 DIAGNOSIS — I10 Essential (primary) hypertension: Secondary | ICD-10-CM | POA: Diagnosis not present

## 2018-03-17 DIAGNOSIS — N400 Enlarged prostate without lower urinary tract symptoms: Secondary | ICD-10-CM | POA: Diagnosis not present

## 2018-04-13 DIAGNOSIS — R011 Cardiac murmur, unspecified: Secondary | ICD-10-CM | POA: Diagnosis not present

## 2018-04-13 DIAGNOSIS — R55 Syncope and collapse: Secondary | ICD-10-CM | POA: Diagnosis not present

## 2018-04-13 DIAGNOSIS — R300 Dysuria: Secondary | ICD-10-CM | POA: Diagnosis not present

## 2018-05-09 ENCOUNTER — Ambulatory Visit (HOSPITAL_COMMUNITY)
Admission: RE | Admit: 2018-05-09 | Discharge: 2018-05-09 | Disposition: A | Discharge status: Home / Self Care | Payer: Medicare HMO | Source: Ambulatory Visit | Attending: Nurse Practitioner | Admitting: Nurse Practitioner

## 2018-05-09 ENCOUNTER — Encounter (HOSPITAL_COMMUNITY): Payer: Self-pay

## 2018-05-09 ENCOUNTER — Inpatient Hospital Stay: Payer: Medicare HMO | Attending: Hematology

## 2018-05-09 DIAGNOSIS — K769 Liver disease, unspecified: Secondary | ICD-10-CM | POA: Diagnosis not present

## 2018-05-09 DIAGNOSIS — Z85028 Personal history of other malignant neoplasm of stomach: Secondary | ICD-10-CM | POA: Diagnosis not present

## 2018-05-09 DIAGNOSIS — G4700 Insomnia, unspecified: Secondary | ICD-10-CM | POA: Insufficient documentation

## 2018-05-09 DIAGNOSIS — N400 Enlarged prostate without lower urinary tract symptoms: Secondary | ICD-10-CM | POA: Insufficient documentation

## 2018-05-09 DIAGNOSIS — D649 Anemia, unspecified: Secondary | ICD-10-CM | POA: Insufficient documentation

## 2018-05-09 DIAGNOSIS — R42 Dizziness and giddiness: Secondary | ICD-10-CM | POA: Diagnosis not present

## 2018-05-09 DIAGNOSIS — R918 Other nonspecific abnormal finding of lung field: Secondary | ICD-10-CM | POA: Diagnosis not present

## 2018-05-09 DIAGNOSIS — N289 Disorder of kidney and ureter, unspecified: Secondary | ICD-10-CM | POA: Diagnosis not present

## 2018-05-09 DIAGNOSIS — C162 Malignant neoplasm of body of stomach: Secondary | ICD-10-CM | POA: Diagnosis not present

## 2018-05-09 DIAGNOSIS — F17210 Nicotine dependence, cigarettes, uncomplicated: Secondary | ICD-10-CM | POA: Insufficient documentation

## 2018-05-09 DIAGNOSIS — I712 Thoracic aortic aneurysm, without rupture: Secondary | ICD-10-CM | POA: Insufficient documentation

## 2018-05-09 DIAGNOSIS — R972 Elevated prostate specific antigen [PSA]: Secondary | ICD-10-CM | POA: Diagnosis not present

## 2018-05-09 DIAGNOSIS — E876 Hypokalemia: Secondary | ICD-10-CM | POA: Diagnosis not present

## 2018-05-09 DIAGNOSIS — Z9221 Personal history of antineoplastic chemotherapy: Secondary | ICD-10-CM | POA: Insufficient documentation

## 2018-05-09 LAB — IRON AND TIBC
Iron: 100 ug/dL (ref 42–163)
SATURATION RATIOS: 36 % (ref 20–55)
TIBC: 279 ug/dL (ref 202–409)
UIBC: 179 ug/dL (ref 117–376)

## 2018-05-09 LAB — CBC WITH DIFFERENTIAL (CANCER CENTER ONLY)
ABS IMMATURE GRANULOCYTES: 0.02 10*3/uL (ref 0.00–0.07)
BASOS PCT: 1 %
Basophils Absolute: 0 10*3/uL (ref 0.0–0.1)
Eosinophils Absolute: 0 10*3/uL (ref 0.0–0.5)
Eosinophils Relative: 1 %
HCT: 43.6 % (ref 39.0–52.0)
Hemoglobin: 13.5 g/dL (ref 13.0–17.0)
IMMATURE GRANULOCYTES: 0 %
Lymphocytes Relative: 29 %
Lymphs Abs: 1.7 10*3/uL (ref 0.7–4.0)
MCH: 29 pg (ref 26.0–34.0)
MCHC: 31 g/dL (ref 30.0–36.0)
MCV: 93.6 fL (ref 80.0–100.0)
Monocytes Absolute: 0.4 10*3/uL (ref 0.1–1.0)
Monocytes Relative: 6 %
NEUTROS ABS: 3.6 10*3/uL (ref 1.7–7.7)
NEUTROS PCT: 63 %
PLATELETS: 208 10*3/uL (ref 150–400)
RBC: 4.66 MIL/uL (ref 4.22–5.81)
RDW: 12.6 % (ref 11.5–15.5)
WBC: 5.7 10*3/uL (ref 4.0–10.5)
nRBC: 0 % (ref 0.0–0.2)

## 2018-05-09 LAB — CMP (CANCER CENTER ONLY)
ALBUMIN: 3.9 g/dL (ref 3.5–5.0)
ALT: 30 U/L (ref 0–44)
ANION GAP: 8 (ref 5–15)
AST: 25 U/L (ref 15–41)
Alkaline Phosphatase: 101 U/L (ref 38–126)
BILIRUBIN TOTAL: 0.8 mg/dL (ref 0.3–1.2)
BUN: 11 mg/dL (ref 8–23)
CO2: 28 mmol/L (ref 22–32)
Calcium: 9.5 mg/dL (ref 8.9–10.3)
Chloride: 107 mmol/L (ref 98–111)
Creatinine: 1.06 mg/dL (ref 0.61–1.24)
GFR, Est AFR Am: >60 mL/min (ref >60)
GLUCOSE: 84 mg/dL (ref 70–99)
POTASSIUM: 4.1 mmol/L (ref 3.5–5.1)
Sodium: 143 mmol/L (ref 135–145)
TOTAL PROTEIN: 6.4 g/dL — AB (ref 6.5–8.1)

## 2018-05-09 LAB — FERRITIN: FERRITIN: 64 ng/mL (ref 24–336)

## 2018-05-09 MED ORDER — SODIUM CHLORIDE (PF) 0.9 % IJ SOLN
INTRAMUSCULAR | Status: AC
  Filled 2018-05-09: qty 50

## 2018-05-09 MED ORDER — IOHEXOL 300 MG/ML  SOLN
100.0000 mL | Freq: Once | INTRAMUSCULAR | Status: AC | PRN
  Administered 2018-05-09: 100 mL via INTRAVENOUS

## 2018-05-18 ENCOUNTER — Ambulatory Visit (HOSPITAL_COMMUNITY): Payer: Medicare HMO

## 2018-05-19 ENCOUNTER — Telehealth: Payer: Self-pay

## 2018-05-19 NOTE — Telephone Encounter (Signed)
Spoke with patient per Dr. Burr Medico regarding CT results, informed him CT was good, no evidence of metastatic disease in chest, abdomen or pelvis.  Dr. Burr Medico will go into more detail with him at his appointment on 05/26/2018, reminded him of the appointment times.   Patient verbalized an understanding.

## 2018-05-23 NOTE — Progress Notes (Signed)
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 Microscopic Comment Several of the fragments are involved by moderately differentiated adenocarcinoma.    02/17/2017 Procedure    UPPER ENDOSCOPY  FINDINGS - Normal esophagus. - Z-line irregular, 44 cm from the incisors. - Red blood in the gastric body and in the gastric antrum. - Large gastric ulcer. Biopsied. - Erythematous mucosa in the antrum. - Normal cardia, gastric fundus, gastric body and pylorus. - Normal duodenal bulb and second portion of the duodenum. Comment: Endoscopic appearence concerning for malignant ulcer.    03/11/2017 Procedure    UPPER EUS PER DR. Ardis Hughs Findings: 1. The esophagus was normal. 2. There was a 2-3cm ulcerated, malignant mass along the greater curvature of the stomach, approximately mid-body. The mass was non-circumferential. 3. The duodenum was normal. Endosonographic Finding 1. The  gastric mass above correlated with a hypoechoic and heterogenous non-circumferential mass that measured 2.9cm across, 35m deep. The endosonographic borders were poorly defined and there was clear sonographic evidence suggesting invasion into and through the muscularis propria layer without evident invasion into nearby organs (uT3). 2. The duodenal lymphnode described on recent CT scan appeared reactive by [place]. 3. No perigastric adenopathy (uN0) 4. Limited views of the liver, spleen, pancreas, bile duct, gallbladder were all normal. - Along the greater curvature of the stomach, approximately mid-body, there is a 2.9cm uT3N0 (clinical stage IIB) gastric adenocarcinoma.    04/06/2017 Imaging    CT CHEST IMPRESSION: 1. No acute cardiopulmonary abnormalities. 2. Two small pulmonary nodules are noted measuring up to 5 mm. Nonspecific but warrant attention on follow-up imaging follow up 3. Nonspecific hyperdense, possibly enhancing lesion along the dome of liver is identified. In a patient that is at increased risk for more definitive assessment of this structure with contrast enhanced MRI of the liver is advised.    04/21/2017 Imaging    MR ABDOMEN IMPRESSION: 1. Enhancing 2.9 cm mass along the lesser curvature in the gastric antrum, compatible with known gastric malignancy . 2. Mildly enlarged gastrohepatic ligament lymph node, cannot exclude nodal metastasis. 3. No definite liver metastatic disease. Hyperenhancing 0.9 cm liver dome mass remains inconclusive, although the MRI features are most suggestive of a flash filling hemangioma, and the mass has been stable for nearly 2 months. Additional subcentimeter focus of hyperenhancement in the lateral segment left liver lobe is most likely a benign transient vascular phenomenon. Recommend attention to these lesions on a follow-up MRI abdomen without and with IV contrast in 3-6 months. This recommendation follows  ACR  consensus guidelines: Management of Incidental Liver Lesions on CT: A White Paper of the ACR Incidental Findings Committee. J Am Coll Radiol 2017; 41:3244-0102. 4. Benign right adrenal adenoma.      04/22/2017 Pathology Results    Diagnosis 1. Liver, biopsy - BILE DUCT HAMARTOMA. - THERE IS NO EVIDENCE OF MALIGNANCY. 2. Lymph nodes, regional resection, portal - METASTATIC CARCINOMA IN 2 OF 6 LYMPH NODES (2/6). 3. Stomach, resection for tumor, distal - INVASIVE ADENOCARCINOMA, POORLY DIFFERENTIATED, SPANNING 3.8 CM. - PERINEURAL INVASION IS IDENTIFIED. - ADENOCARCINOMA INVOLVES THE SEROSA. - METASTATIC CARCINOMA IN 12 OF 30 LYMPH NODES (12/30), WITH EXTRACAPSULAR EXTENSION. - SEE ONCOLOGY TABLE BELOW. 4. Lymph node, biopsy, common hepatic artery - THERE IS NO EVIDENCE OF CARCINOMA IN 1 OF 1 LYMPH NODE (0/1). Microscopic Comment 3. STOMACH: Specimen: Stomach. Procedure: Partial gastrectomy. Tumor Site: Greater curvature. Tumor Size: 3.8 cm Histologic Type: Adenocarcinoma. Histologic Grade: G3: poorly differentiated. Microscopic Extent of Tumor: Adenocarcinoma involves the serosa. Margins (select all that apply): Adenoca Proximal Margin: Negative for adenocarcinoma. Distal Margin: Negative for adenocarcinoma. Treatment Effect: N/A Lymph-Vascular Invasion: Not identified. Perineural Invasion: Present. Additional findings: Chronic gastritis. Ancillary testing: Can be performed upon clinician request. 1 of 3 Duplicate copy FINAL for Gabriel Poole, Gabriel Poole ([id number]) Microscopic Comment(continued) Lymph nodes: number examined - 37; number positive: 14 Pathologic Staging: pT4a, pN3a (JBK:gt, 04/27/17)    06/02/2017 - 09/30/2017 Adjuvant Chemotherapy    FOLFOX every 2 weeks, oxaliplatin stopped in March 2019 due to neuropathy, chemo stopped on 09/30/2017 per pt's request     10/29/2017 Imaging    10/29/2017 CT CAP  IMPRESSION: 1. Status post distal gastrectomy. No evidence for  metastatic disease in the chest, abdomen, or pelvis. 2. Stable tiny right pulmonary nodules. Continued attention on follow-up recommended. 3. 9 mm hypervascular lesion in the dome of the liver is stable over multiple studies back to 04/05/2017. Continued attention on follow-up suggested. 4. Ascending thoracic aorta measures 4.2 cm diameter. Recommend annual imaging followup by CTA or MRA. This recommendation follows 2010 ACCF/AHA/AATS/ACR/ASA/SCA/SCAI/SIR/STS/SVM Guidelines for the Diagnosis and Management of Patients with Thoracic Aortic Disease. Circulation. 2010; 121: H474-Q595 . 5. Marked prostatomegaly. 6.  Aortic Atherosclerois (ICD10-170.0)    05/09/2018 Imaging    05/09/2018 CT CAP IMPRESSION: 1. No definite findings to suggest metastatic disease to the chest, abdomen or pelvis. 2. Multiple small pulmonary nodules scattered throughout the lungs bilaterally measuring 5 mm or less in size, stable compared to prior studies from 2018, favored to be benign. Continued attention on future follow-up examinations is recommended. 3. Aortic atherosclerosis with ectasia of the ascending thoracic aorta (4 cm in diameter). Recommend annual imaging followup by CTA or MRA. This recommendation follows 2010 ACCF/AHA/AATS/ACR/ASA/SCA/SCAI/SIR/STS/SVM Guidelines for the Diagnosis and Management of Patients with Thoracic Aortic Disease. Circulation. 2010; 121: G387-F643. 4. Multiple small calculi lying dependently in the right-side of the urinary bladder. 5. Severe prostatomegaly. 6. Additional incidental findings, as above.     CURRENT THERAPY Surveillance   INTERVAL HISTORY: Gabriel Poole is a 73 y.o. male who is here for follow-up. Since our last visit, he had a CT CAP done. Today, he is here with his wife. He started having a productive cough last night. He had a flu and pneumonia shot. He denies fever, chest pain, or shortness of breath. He also reports episodes of  lightheadedness. He had an episode this morning, and the previous episode was last week. He is losing weight, but denies loss of appetite. His abdomen  hurts when he eats a lot. He tries to stay hydrated. He denies changes in bowel habits or blood in stools. He only urinates at night. Has history of prostate enlargement and has a weak stream with says that "it stings" when he urinates.  He still smokes.  Pertinent positives and negatives of review of systems are listed and detailed within the above HPI.  REVIEW OF SYSTEMS:   Constitutional: Denies fevers, chills or abnormal weight loss (+) lightheadedness (+) weigh tloss Eyes: Denies blurriness of vision Ears, nose, mouth, throat, and face: Denies mucositis or sore throat Respiratory: Denies dyspnea or wheezes (+) productive cough Cardiovascular: Denies palpitation, chest discomfort or lower extremity swelling Gastrointestinal:  Denies nausea, heartburn or change in bowel habits (+) abdominal pain when he eats a lot GU: (+) weak stream (+) mild dysuria Skin: Denies abnormal skin rashes Lymphatics: Denies new lymphadenopathy or easy bruising Neurological:Denies numbness, tingling or new weaknesses Behavioral/Psych: Mood is stable, no new changes  All other systems were reviewed with the patient and are negative.  MEDICAL HISTORY:  Past Medical History:  Diagnosis Date  . Arthritis   . Cancer [facility]) dx oct 02-2017   stomach  . Gout   . Heart murmur   . Hyperlipidemia   . Hypertension   . Stomach cancer [facility])     SURGICAL HISTORY: Past Surgical History:  Procedure Laterality Date  . COLONOSCOPY    . ESOPHAGOGASTRODUODENOSCOPY N/A 02/17/2017   Procedure: ESOPHAGOGASTRODUODENOSCOPY (EGD);  Surgeon: Rogene Houston, MD;  [facility];  Service: Endoscopy;  Laterality: N/A;  3:00  . EUS N/A 03/11/2017   Procedure: UPPER ENDOSCOPIC ULTRASOUND (EUS) LINEAR;  Surgeon: Milus Banister, MD;  Location: [facility];  Service:  Endoscopy;  Laterality: N/A;  . EUS N/A 03/11/2017   Procedure: UPPER ENDOSCOPIC ULTRASOUND (EUS) RADIAL;  Surgeon: Milus Banister, MD;  Location: [facility];  Service: Endoscopy;  Laterality: N/A;  . FINGER SURGERY     Lt middle   . GASTRECTOMY N/A 04/22/2017   Procedure: PARTIAL GASTRECTOMY;  Surgeon: Stark Klein, MD;  Location: [place];  Service: General;  Laterality: N/A;  . GASTROJEJUNOSTOMY N/A 04/22/2017   Procedure: JEJUNAL FEEDING TUBE PLACEMENT;  Surgeon: Stark Klein, MD;  Location: [place];  Service: General;  Laterality: N/A;  . HYDROCELE EXCISION  02/12/2011   Procedure: HYDROCELECTOMY ADULT;  Surgeon: Marissa Nestle;  Location: [facility];  Service: Urology;  Laterality: Left;  . LAPAROSCOPY N/A 04/22/2017   Procedure: LAPAROSCOPY DIAGNOSTIC ERAS PATHWAY;  Surgeon: Stark Klein, MD;  Location: [place];  Service: General;  Laterality: N/A;  EPIDURAL  . PORTACATH PLACEMENT N/A 05/28/2017   Procedure: INSERTION PORT-A-CATH ERAS PATHWAY;  Surgeon: Stark Klein, MD;  Location: [place];  Service: General;  Laterality: N/A;    I have reviewed the social history and family history with the patient and they are unchanged from previous note.  ALLERGIES:  is allergic to penicillins.  MEDICATIONS:  Current Outpatient Medications  Medication Sig Dispense Refill  . amLODipine (NORVASC) 10 MG tablet Take 1 tablet (10 mg total) by mouth daily. 90 tablet 0  . aspirin EC 81 MG tablet Take 81 mg by mouth daily.    Marland Kitchen atorvastatin (LIPITOR) 40 MG tablet Take 1 tablet (40 mg total) by mouth daily at 6 PM. 90 tablet 0  . Cholecalciferol (VITAMIN D3) 5000 units CAPS Take 5,000 Units by mouth daily.    . metoprolol tartrate (LOPRESSOR) 50 MG tablet Take  50 mg by mouth 2 (two) times daily.    . potassium chloride SA (K-DUR,KLOR-CON) 20 MEQ tablet Take 1 tablet (20 mEq total) by mouth 2 (two) times daily. (Patient taking differently: Take 20 mEq by mouth daily. ) 40 tablet 1  .  triamterene-hydrochlorothiazide (MAXZIDE-25) 37.5-25 MG tablet Take 1 tablet by mouth daily.     No current facility-administered medications for this visit.     PHYSICAL EXAMINATION: ECOG PERFORMANCE STATUS: 1 - Symptomatic but completely ambulatory  Vitals:   05/26/18 0952  BP: 124/83  Pulse: 71  Resp: 18  Temp: 98.3 F (36.8 C)  SpO2: 100%   Filed Weights   05/26/18 0952  Weight: 144 lb 4.8 oz (65.5 kg)    GENERAL:alert, no distress and comfortable SKIN: skin color, texture, turgor are normal, no rashes or significant lesions EYES: normal, Conjunctiva are pink and non-injected, sclera clear OROPHARYNX:no exudate, no erythema and lips, buccal mucosa, and tongue normal (+) holds a plastic bag to spit sputum NECK: supple, thyroid normal size, non-tender, without nodularity LYMPH:  no palpable lymphadenopathy in the cervical, axillary or inguinal LUNGS: clear to auscultation and percussion with normal breathing effort HEART: regular rate & rhythm and no lower extremity edema (+) heart murmur ABDOMEN:abdomen soft, non-tender and normal bowel sounds Musculoskeletal:no cyanosis of digits and no clubbing  NEURO: alert & oriented x 3 with fluent speech, no focal motor/sensory deficits  LABORATORY DATA:  I have reviewed the data as listed CBC Latest Ref Rng & Units 05/26/2018 05/09/2018 02/03/2018  WBC 4.0 - 10.5 K/uL 5.4 5.7 6.1  Hemoglobin 13.0 - 17.0 g/dL 13.4 13.5 12.8(L)  Hematocrit 39.0 - 52.0 % 42.9 43.6 39.4  Platelets 150 - 400 K/uL 184 208 196     CMP Latest Ref Rng & Units 05/26/2018 05/09/2018 02/03/2018  Glucose 70 - 99 mg/dL 78 84 83  BUN 8 - 23 mg/dL '10 11 14  ' Creatinine 0.61 - 1.24 mg/dL 1.01 1.06 0.99  Sodium 135 - 145 mmol/L 143 143 144  Potassium 3.5 - 5.1 mmol/L 4.2 4.1 3.5  Chloride 98 - 111 mmol/L 107 107 109  CO2 22 - 32 mmol/L '29 28 27  ' Calcium 8.9 - 10.3 mg/dL 9.3 9.5 9.2  Total Protein 6.5 - 8.1 g/dL 6.8 6.4(L) 6.5  Total Bilirubin 0.3 - 1.2 mg/dL  0.8 0.8 0.6  Alkaline Phos 38 - 126 U/L 120 101 109  AST 15 - 41 U/L '19 25 29  ' ALT 0 - 44 U/L 15 30 46(H)      RADIOGRAPHIC STUDIES: I have personally reviewed the radiological images as listed and agreed with the findings in the report. No results found.   05/09/2018 CT CAP IMPRESSION: 1. No definite findings to suggest metastatic disease to the chest, abdomen or pelvis. 2. Multiple small pulmonary nodules scattered throughout the lungs bilaterally measuring 5 mm or less in size, stable compared to prior studies from 2018, favored to be benign. Continued attention on future follow-up examinations is recommended. 3. Aortic atherosclerosis with ectasia of the ascending thoracic aorta (4 cm in diameter). Recommend annual imaging followup by CTA or MRA. This recommendation follows 2010 ACCF/AHA/AATS/ACR/ASA/SCA/SCAI/SIR/STS/SVM Guidelines for the Diagnosis and Management of Patients with Thoracic Aortic Disease. Circulation. 2010; 121: L798-X211. 4. Multiple small calculi lying dependently in the right-side of the urinary bladder. 5. Severe prostatomegaly. 6. Additional incidental findings, as above.  ASSESSMENT & PLAN:  Gabriel Poole is a 73 y.o. male with history of  1. Primary adenocarcinoma  of the pyloric antrum, pT4a, PN3aM0, stage IIIB, Grade3, MSI-stable  -Diagnosed in 02/2017. S/p surgical resection and adjuvant chemotherapy FOLFOX.  Patient was not compliant with chemo, oxaliplatin stopped after 4 cycles, and 5-FU stopped after 8 cycles treatment per his request. -current on surveillance.  We discussed is a high risk of recurrence due to he is locally advanced disease. -I reviewed and discussed his CT CAP from May 09, 2018, which showed no evidence of recurrence.  I reviewed the image myself.   -Labs reviewed, CBC is WNLs.CMP pending -He is clinically stable and doing well. He complains of weight loss and abdominal pain when he eats a lot. His appetite is good.  Due to his gastrostomy, I told him to eat small meals, but more frequent. -f/u in 3 months, plan to repeat staging scan in 6 months.   2. Anemia  -Currently on iron pills. Continue. -Labs reviewed, CBC showed Hg 13.4.  3. Hypokalemia -CMP pending -Currently on potassium pills daily. Continue.  4.Asending aortic aneurysm -Monitored on surveillance scans for his gastric cancer  5. Intermittent dizziness -f/u with PCP  -He still experiences episodes of lightheadedness. He denies falls or fainting. Last episode was this morning, and the previous one was last week. -I suspect could be related to his orthostasis.  I encouraged him to check his blood pressure at home.  6. Weak urinary stream, mild dysuria -History of prostate enlargement -I advised him to see a urologist   7. Productive cough -Started this morning. Denies fever, CP, or dyspnea -received a flu and pneumonia shot -I advised him to monitor his symptoms and call if his symptoms worsen or he develops fever -will monitor  8. Smoking cessation  -He still smokes -I advised him to completely stop smoking and offered joining a smoking cessation program, but states that he has transportation issue. -He will try to cut down  9. BPH and elevated PSA -He has nocturia and urinary frequency, history of BPH, and elevated PSA in July 2019 -He was seen by urologist before, I strongly encouraged him to make appointment as soon as possible -I discussed his CT scan findings, which showed severe prostatomegaly   Plan  -f/u in 3 months with labs  No problem-specific Assessment & Plan notes found for this encounter.   No orders of the defined types were placed in this encounter.  All questions were answered. The patient knows to call the clinic with any problems, questions or concerns. No barriers to learning was detected. I spent 20 minutes counseling the patient face to face. The total time spent in the appointment was 30  minutes and more than 50% was on counseling and review of test results  I, Noor Dweik am acting as scribe for Dr. Truitt Merle.  I have reviewed the above documentation for accuracy and completeness, and I agree with the above.     Truitt Merle, MD 05/26/2018

## 2018-05-26 ENCOUNTER — Inpatient Hospital Stay: Payer: Medicare HMO

## 2018-05-26 ENCOUNTER — Inpatient Hospital Stay (HOSPITAL_BASED_OUTPATIENT_CLINIC_OR_DEPARTMENT_OTHER): Payer: Medicare HMO | Admitting: Hematology

## 2018-05-26 ENCOUNTER — Encounter: Payer: Self-pay | Admitting: Hematology

## 2018-05-26 ENCOUNTER — Inpatient Hospital Stay: Payer: Medicare HMO | Attending: Hematology

## 2018-05-26 VITALS — BP 124/83 | HR 71 | Temp 98.3°F | Resp 18 | Ht 70.0 in | Wt 144.3 lb

## 2018-05-26 DIAGNOSIS — R3912 Poor urinary stream: Secondary | ICD-10-CM | POA: Diagnosis not present

## 2018-05-26 DIAGNOSIS — C162 Malignant neoplasm of body of stomach: Secondary | ICD-10-CM

## 2018-05-26 DIAGNOSIS — Z9221 Personal history of antineoplastic chemotherapy: Secondary | ICD-10-CM | POA: Diagnosis not present

## 2018-05-26 DIAGNOSIS — Z7982 Long term (current) use of aspirin: Secondary | ICD-10-CM

## 2018-05-26 DIAGNOSIS — I712 Thoracic aortic aneurysm, without rupture: Secondary | ICD-10-CM | POA: Insufficient documentation

## 2018-05-26 DIAGNOSIS — Z85028 Personal history of other malignant neoplasm of stomach: Secondary | ICD-10-CM | POA: Insufficient documentation

## 2018-05-26 DIAGNOSIS — R05 Cough: Secondary | ICD-10-CM | POA: Insufficient documentation

## 2018-05-26 DIAGNOSIS — R300 Dysuria: Secondary | ICD-10-CM

## 2018-05-26 DIAGNOSIS — N401 Enlarged prostate with lower urinary tract symptoms: Secondary | ICD-10-CM | POA: Diagnosis not present

## 2018-05-26 DIAGNOSIS — Z79899 Other long term (current) drug therapy: Secondary | ICD-10-CM | POA: Insufficient documentation

## 2018-05-26 DIAGNOSIS — R972 Elevated prostate specific antigen [PSA]: Secondary | ICD-10-CM

## 2018-05-26 DIAGNOSIS — F17210 Nicotine dependence, cigarettes, uncomplicated: Secondary | ICD-10-CM

## 2018-05-26 DIAGNOSIS — R42 Dizziness and giddiness: Secondary | ICD-10-CM | POA: Diagnosis not present

## 2018-05-26 DIAGNOSIS — E876 Hypokalemia: Secondary | ICD-10-CM | POA: Insufficient documentation

## 2018-05-26 DIAGNOSIS — Z95828 Presence of other vascular implants and grafts: Secondary | ICD-10-CM

## 2018-05-26 DIAGNOSIS — I10 Essential (primary) hypertension: Secondary | ICD-10-CM

## 2018-05-26 DIAGNOSIS — D649 Anemia, unspecified: Secondary | ICD-10-CM | POA: Diagnosis not present

## 2018-05-26 LAB — CBC WITH DIFFERENTIAL (CANCER CENTER ONLY)
Abs Immature Granulocytes: 0.02 10*3/uL (ref 0.00–0.07)
BASOS ABS: 0 10*3/uL (ref 0.0–0.1)
BASOS PCT: 1 %
EOS ABS: 0 10*3/uL (ref 0.0–0.5)
Eosinophils Relative: 1 %
HEMATOCRIT: 42.9 % (ref 39.0–52.0)
Hemoglobin: 13.4 g/dL (ref 13.0–17.0)
Immature Granulocytes: 0 %
LYMPHS ABS: 1.3 10*3/uL (ref 0.7–4.0)
Lymphocytes Relative: 24 %
MCH: 28.8 pg (ref 26.0–34.0)
MCHC: 31.2 g/dL (ref 30.0–36.0)
MCV: 92.3 fL (ref 80.0–100.0)
Monocytes Absolute: 0.8 10*3/uL (ref 0.1–1.0)
Monocytes Relative: 14 %
NRBC: 0 % (ref 0.0–0.2)
Neutro Abs: 3.3 10*3/uL (ref 1.7–7.7)
Neutrophils Relative %: 60 %
PLATELETS: 184 10*3/uL (ref 150–400)
RBC: 4.65 MIL/uL (ref 4.22–5.81)
RDW: 12.7 % (ref 11.5–15.5)
WBC Count: 5.4 10*3/uL (ref 4.0–10.5)

## 2018-05-26 LAB — CMP (CANCER CENTER ONLY)
ALBUMIN: 3.9 g/dL (ref 3.5–5.0)
ALK PHOS: 120 U/L (ref 38–126)
ALT: 15 U/L (ref 0–44)
ANION GAP: 7 (ref 5–15)
AST: 19 U/L (ref 15–41)
BUN: 10 mg/dL (ref 8–23)
CALCIUM: 9.3 mg/dL (ref 8.9–10.3)
CO2: 29 mmol/L (ref 22–32)
CREATININE: 1.01 mg/dL (ref 0.61–1.24)
Chloride: 107 mmol/L (ref 98–111)
GFR, Estimated: >60 mL/min (ref >60)
Glucose, Bld: 78 mg/dL (ref 70–99)
Potassium: 4.2 mmol/L (ref 3.5–5.1)
SODIUM: 143 mmol/L (ref 135–145)
TOTAL PROTEIN: 6.8 g/dL (ref 6.5–8.1)
Total Bilirubin: 0.8 mg/dL (ref 0.3–1.2)

## 2018-05-26 MED ORDER — SODIUM CHLORIDE 0.9% FLUSH
10.0000 mL | INTRAVENOUS | Status: DC | PRN
  Filled 2018-05-26: qty 10

## 2018-05-26 MED ORDER — HEPARIN SOD (PORK) LOCK FLUSH 100 UNIT/ML IV SOLN
500.0000 [IU] | Freq: Once | INTRAVENOUS | Status: DC | PRN
  Filled 2018-05-26: qty 5

## 2018-05-26 NOTE — Progress Notes (Signed)
Pt. Did not want port accessed, blood drawn peripherally, Pt tolerated well

## 2018-05-27 ENCOUNTER — Telehealth: Payer: Self-pay | Admitting: Hematology

## 2018-05-27 NOTE — Telephone Encounter (Signed)
Called patient about scheduled appts per 01/16 los.  Patient did not answer and I was not able to leave a voicemail.  Printed and mailed calendar.

## 2018-06-01 ENCOUNTER — Telehealth: Payer: Self-pay

## 2018-06-01 NOTE — Telephone Encounter (Signed)
Patient calls stating he has been coughing up yellow stuff for 3 to 4 days, denies fever however states gets an occasional chill, requesting Dr. Burr Poole call in antibiotics.  Explained to patient that we don't normally just call in antibiotics.  He does have a primary care doctor. Dr. Burr Poole aware.  Explained I will speak with Dr. Burr Poole and call him back at [phone]

## 2018-06-02 ENCOUNTER — Telehealth: Payer: Self-pay

## 2018-06-02 DIAGNOSIS — F17200 Nicotine dependence, unspecified, uncomplicated: Secondary | ICD-10-CM | POA: Diagnosis not present

## 2018-06-02 DIAGNOSIS — J40 Bronchitis, not specified as acute or chronic: Secondary | ICD-10-CM | POA: Diagnosis not present

## 2018-06-02 DIAGNOSIS — R300 Dysuria: Secondary | ICD-10-CM | POA: Diagnosis not present

## 2018-06-02 NOTE — Telephone Encounter (Signed)
Per Dr. Burr Medico informed patient to get appointment with PCP for his symptoms of coughing up yellow stuff, chills, he states he has one today at 1:30.   [phone]

## 2018-06-20 DIAGNOSIS — N401 Enlarged prostate with lower urinary tract symptoms: Secondary | ICD-10-CM | POA: Diagnosis not present

## 2018-06-20 DIAGNOSIS — R3912 Poor urinary stream: Secondary | ICD-10-CM | POA: Diagnosis not present

## 2018-06-20 MED FILL — TAMSULOSIN HCL 0.4 MG CAP: 0.4 | 30 days supply | Qty: 30 | Fill #0

## 2018-06-20 MED FILL — SULFAMETHOXAZOLE-TMP DS TAB: 800-160 | 7 days supply | Qty: 14 | Fill #0

## 2018-07-05 DIAGNOSIS — R3914 Feeling of incomplete bladder emptying: Secondary | ICD-10-CM | POA: Diagnosis not present

## 2018-07-05 DIAGNOSIS — N401 Enlarged prostate with lower urinary tract symptoms: Secondary | ICD-10-CM | POA: Diagnosis not present

## 2018-07-05 DIAGNOSIS — R972 Elevated prostate specific antigen [PSA]: Secondary | ICD-10-CM | POA: Diagnosis not present

## 2018-08-11 ENCOUNTER — Telehealth: Payer: Self-pay | Admitting: Hematology

## 2018-08-11 NOTE — Telephone Encounter (Signed)
Unable to reach patient per 3/31 sch message - left message for patient on sgo phone with new appt date and time

## 2018-08-22 NOTE — Progress Notes (Signed)
[place]   [phone] Fax:([phone]) [phone]   Clinic Follow up Note   Patient Care Team: Gabriel Graves, DO as PCP - General Gabriel Merle, MD as Consulting Physician (Hematology) Gabriel Feeling, NP as Nurse Practitioner (Nurse Practitioner) Gabriel Klein, MD as Consulting Physician (General Surgery) Gabriel Houston, MD as Consulting Physician (Gastroenterology) Gabriel Banister, MD as Attending Physician (Gastroenterology)   I connected with Gabriel Poole on 08/24/2018 at  8:30 AM EDT by telephone visit and verified that I am speaking with the correct person using two identifiers.  I discussed the limitations, risks, security and privacy concerns of performing an evaluation and management service by telephone and the availability of in person appointments. I also discussed with the patient that there may be a patient responsible charge related to this service. The patient expressed understanding and agreed to proceed.   Patient's location:  His home  Provider's location:  My Office  CHIEF COMPLAINT: F/u on gastric cancer  SUMMARY OF ONCOLOGIC HISTORY: Oncology History   Cancer Staging Malignant neoplasm of body of stomach ([place]) Staging form: Stomach, AJCC 8th Edition - Clinical stage from 03/11/2017: Stage IIB (cT3, cN0, cM0) - Unsigned - Pathologic stage from 04/22/2017: Stage IIIB (pT4a, pN3a, cM0) - Signed by Gabriel Feeling, NP on 05/17/2017       Malignant neoplasm of body of stomach ([place])   02/17/2017 Initial Diagnosis    Malignant neoplasm of body of stomach ([place])    02/17/2017 Pathology Results    Diagnosis Stomach, biopsy, gastric ulcer - ADENOCARCINOMA. Microscopic Comment Several of the fragments are involved by moderately differentiated adenocarcinoma.    02/17/2017 Procedure    UPPER ENDOSCOPY  FINDINGS - Normal esophagus. - Z-line irregular, 44 cm from the incisors. - Red blood in the gastric body and in the gastric  antrum. - Large gastric ulcer. Biopsied. - Erythematous mucosa in the antrum. - Normal cardia, gastric fundus, gastric body and pylorus. - Normal duodenal bulb and second portion of the duodenum. Comment: Endoscopic appearence concerning for malignant ulcer.    03/11/2017 Procedure    UPPER EUS PER DR. Ardis Poole Findings: 1. The esophagus was normal. 2. There was a 2-3cm ulcerated, malignant mass along the greater curvature of the stomach, approximately mid-body. The mass was non-circumferential. 3. The duodenum was normal. Endosonographic Finding 1. The gastric mass above correlated with a hypoechoic and heterogenous non-circumferential mass that measured 2.9cm across, 56m deep. The endosonographic borders were poorly defined and there was clear sonographic evidence suggesting invasion into and through the muscularis propria layer without evident invasion into nearby organs (uT3). 2. The duodenal lymphnode described on recent CT scan appeared reactive by [place]. 3. No perigastric adenopathy (uN0) 4. Limited views of the liver, spleen, pancreas, bile duct, gallbladder were all normal. - Along the greater curvature of the stomach, approximately mid-body, there is a 2.9cm uT3N0 (clinical stage IIB) gastric adenocarcinoma.    04/06/2017 Imaging    CT CHEST IMPRESSION: 1. No acute cardiopulmonary abnormalities. 2. Two small pulmonary nodules are noted measuring up to 5 mm. Nonspecific but warrant attention on follow-up imaging follow up 3. Nonspecific hyperdense, possibly enhancing lesion along the dome of liver is identified. In a patient that is at increased risk for more definitive assessment of this structure with contrast enhanced MRI of the liver is advised.    04/21/2017 Imaging    MR ABDOMEN IMPRESSION: 1. Enhancing 2.9 cm mass along the lesser curvature in the gastric antrum,  compatible with known gastric malignancy . 2. Mildly enlarged gastrohepatic ligament lymph node,  cannot exclude nodal metastasis. 3. No definite liver metastatic disease. Hyperenhancing 0.9 cm liver dome mass remains inconclusive, although the MRI features are most suggestive of a flash filling hemangioma, and the mass has been stable for nearly 2 months. Additional subcentimeter focus of hyperenhancement in the lateral segment left liver lobe is most likely a benign transient vascular phenomenon. Recommend attention to these lesions on a follow-up MRI abdomen without and with IV contrast in 3-6 months. This recommendation follows ACR consensus guidelines: Management of Incidental Liver Lesions on CT: A White Paper of the ACR Incidental Findings Committee. J Am Coll Radiol 2017; 09:8119-1478. 4. Benign right adrenal adenoma.      04/22/2017 Pathology Results    Diagnosis 1. Liver, biopsy - BILE DUCT HAMARTOMA. - THERE IS NO EVIDENCE OF MALIGNANCY. 2. Lymph nodes, regional resection, portal - METASTATIC CARCINOMA IN 2 OF 6 LYMPH NODES (2/6). 3. Stomach, resection for tumor, distal - INVASIVE ADENOCARCINOMA, POORLY DIFFERENTIATED, SPANNING 3.8 CM. - PERINEURAL INVASION IS IDENTIFIED. - ADENOCARCINOMA INVOLVES THE SEROSA. - METASTATIC CARCINOMA IN 12 OF 30 LYMPH NODES (12/30), WITH EXTRACAPSULAR EXTENSION. - SEE ONCOLOGY TABLE BELOW. 4. Lymph node, biopsy, common hepatic artery - THERE IS NO EVIDENCE OF CARCINOMA IN 1 OF 1 LYMPH NODE (0/1). Microscopic Comment 3. STOMACH: Specimen: Stomach. Procedure: Partial gastrectomy. Tumor Site: Greater curvature. Tumor Size: 3.8 cm Histologic Type: Adenocarcinoma. Histologic Grade: G3: poorly differentiated. Microscopic Extent of Tumor: Adenocarcinoma involves the serosa. Margins (select all that apply): Adenoca Proximal Margin: Negative for adenocarcinoma. Distal Margin: Negative for adenocarcinoma. Treatment Effect: N/A Lymph-Vascular Invasion: Not identified. Perineural Invasion: Present. Additional findings: Chronic  gastritis. Ancillary testing: Can be performed upon clinician request. 1 of 3 Duplicate copy FINAL for Gabriel Poole ([id number]) Microscopic Comment(continued) Lymph nodes: number examined - 37; number positive: 14 Pathologic Staging: pT4a, pN3a (JBK:gt, 04/27/17)    06/02/2017 - 09/30/2017 Adjuvant Chemotherapy    FOLFOX every 2 weeks, oxaliplatin stopped in March 2019 due to neuropathy, chemo stopped on 09/30/2017 per pt's request     10/29/2017 Imaging    10/29/2017 CT CAP  IMPRESSION: 1. Status post distal gastrectomy. No evidence for metastatic disease in the chest, abdomen, or pelvis. 2. Stable tiny right pulmonary nodules. Continued attention on follow-up recommended. 3. 9 mm hypervascular lesion in the dome of the liver is stable over multiple studies back to 04/05/2017. Continued attention on follow-up suggested. 4. Ascending thoracic aorta measures 4.2 cm diameter. Recommend annual imaging followup by CTA or MRA. This recommendation follows 2010 ACCF/AHA/AATS/ACR/ASA/SCA/SCAI/SIR/STS/SVM Guidelines for the Diagnosis and Management of Patients with Thoracic Aortic Disease. Circulation. 2010; 121: M578-I696 . 5. Marked prostatomegaly. 6.  Aortic Atherosclerois (ICD10-170.0)    05/09/2018 Imaging    05/09/2018 CT CAP IMPRESSION: 1. No definite findings to suggest metastatic disease to the chest, abdomen or pelvis. 2. Multiple small pulmonary nodules scattered throughout the lungs bilaterally measuring 5 mm or less in size, stable compared to prior studies from 2018, favored to be benign. Continued attention on future follow-up examinations is recommended. 3. Aortic atherosclerosis with ectasia of the ascending thoracic aorta (4 cm in diameter). Recommend annual imaging followup by CTA or MRA. This recommendation follows 2010 ACCF/AHA/AATS/ACR/ASA/SCA/SCAI/SIR/STS/SVM Guidelines for the Diagnosis and Management of Patients with Thoracic Aortic Disease. Circulation.  2010; 121: E952-W413. 4. Multiple small calculi lying dependently in the right-side of the urinary bladder. 5. Severe prostatomegaly. 6. Additional incidental findings, as above.  CURRENT THERAPY:  Surveillance  INTERVAL HISTORY:  Gabriel Poole is here for a follow up of gastric cancer. He was able to identify himself by birth date. I called him twice and he did not answer. I was able to reach a family member Latanya Presser who gave his new phone number. I still was unable to reach him by new phone number. I was able to reach him at a different number. He notes he is not gaining weight although he tries to eat more. He eats twice a day because when he eats significant amount of food he has abdominal pain. He notes having gas and adequate BMs. He denies chest discomfort or cough.    REVIEW OF SYSTEMS:   Constitutional: Denies fevers, chills or abnormal weight loss Eyes: Denies blurriness of vision Ears, nose, mouth, throat, and face: Denies mucositis or sore throat Respiratory: Denies cough, dyspnea or wheezes Cardiovascular: Denies palpitation, chest discomfort or lower extremity swelling Gastrointestinal:  Denies nausea, heartburn or change in bowel habits Skin: Denies abnormal skin rashes Lymphatics: Denies new lymphadenopathy or easy bruising Neurological:Denies numbness, tingling or new weaknesses Behavioral/Psych: Mood is stable, no new changes  All other systems were reviewed with the patient and are negative.  MEDICAL HISTORY:  Past Medical History:  Diagnosis Date  . Arthritis   . Cancer [facility]) dx oct 02-2017   stomach  . Gout   . Heart murmur   . Hyperlipidemia   . Hypertension   . Stomach cancer [facility])     SURGICAL HISTORY: Past Surgical History:  Procedure Laterality Date  . COLONOSCOPY    . ESOPHAGOGASTRODUODENOSCOPY N/A 02/17/2017   Procedure: ESOPHAGOGASTRODUODENOSCOPY (EGD);  Surgeon: Gabriel Houston, MD;  [facility];  Service: Endoscopy;   Laterality: N/A;  3:00  . EUS N/A 03/11/2017   Procedure: UPPER ENDOSCOPIC ULTRASOUND (EUS) LINEAR;  Surgeon: Gabriel Banister, MD;  Location: [facility] END[facility];  Service: Endoscopy;  Laterality: N/A;  . EUS N/A 03/11/2017   Procedure: UPPER ENDOSCOPIC ULTRASOUND (EUS) RADIAL;  Surgeon: Gabriel Banister, MD;  Location: [facility];  Service: Endoscopy;  Laterality: N/A;  . FINGER SURGERY     Lt middle   . GASTRECTOMY N/A 04/22/2017   Procedure: PARTIAL GASTRECTOMY;  Surgeon: Gabriel Klein, MD;  Location: [place];  Service: General;  Laterality: N/A;  . GASTROJEJUNOSTOMY N/A 04/22/2017   Procedure: JEJUNAL FEEDING TUBE PLACEMENT;  Surgeon: Gabriel Klein, MD;  Location: [place];  Service: General;  Laterality: N/A;  . HYDROCELE EXCISION  02/12/2011   Procedure: HYDROCELECTOMY ADULT;  Surgeon: Marissa Nestle;  Location: [facility];  Service: Urology;  Laterality: Left;  . LAPAROSCOPY N/A 04/22/2017   Procedure: LAPAROSCOPY DIAGNOSTIC ERAS PATHWAY;  Surgeon: Gabriel Klein, MD;  Location: [place];  Service: General;  Laterality: N/A;  EPIDURAL  . PORTACATH PLACEMENT N/A 05/28/2017   Procedure: INSERTION PORT-A-CATH ERAS PATHWAY;  Surgeon: Gabriel Klein, MD;  Location: [place];  Service: General;  Laterality: N/A;    I have reviewed the social history and family history with the patient and they are unchanged from previous note.  ALLERGIES:  is allergic to penicillins.  MEDICATIONS:  Current Outpatient Medications  Medication Sig Dispense Refill  . amLODipine (NORVASC) 10 MG tablet Take 1 tablet (10 mg total) by mouth daily. 90 tablet 0  . aspirin EC 81 MG tablet Take 81 mg by mouth daily.    Marland Kitchen atorvastatin (LIPITOR) 40 MG tablet Take 1 tablet (40 mg total) by mouth  daily at 6 PM. 90 tablet 0  . Cholecalciferol (VITAMIN D3) 5000 units CAPS Take 5,000 Units by mouth daily.    . metoprolol tartrate (LOPRESSOR) 50 MG tablet Take 50 mg by mouth 2 (two) times daily.    . potassium chloride SA  (K-DUR,KLOR-CON) 20 MEQ tablet Take 1 tablet (20 mEq total) by mouth 2 (two) times daily. (Patient taking differently: Take 20 mEq by mouth daily. ) 40 tablet 1  . triamterene-hydrochlorothiazide (MAXZIDE-25) 37.5-25 MG tablet Take 1 tablet by mouth daily.     No current facility-administered medications for this visit.     PHYSICAL EXAMINATION: ECOG PERFORMANCE STATUS: 0 - Asymptomatic  ** No vitals taken today, Exam not performed today **  LABORATORY DATA:  I have reviewed the data as listed CBC Latest Ref Rng & Units 05/26/2018 05/09/2018 02/03/2018  WBC 4.0 - 10.5 K/uL 5.4 5.7 6.1  Hemoglobin 13.0 - 17.0 g/dL 13.4 13.5 12.8(L)  Hematocrit 39.0 - 52.0 % 42.9 43.6 39.4  Platelets 150 - 400 K/uL 184 208 196     CMP Latest Ref Rng & Units 05/26/2018 05/09/2018 02/03/2018  Glucose 70 - 99 mg/dL 78 84 83  BUN 8 - 23 mg/dL '10 11 14  ' Creatinine 0.61 - 1.24 mg/dL 1.01 1.06 0.99  Sodium 135 - 145 mmol/L 143 143 144  Potassium 3.5 - 5.1 mmol/L 4.2 4.1 3.5  Chloride 98 - 111 mmol/L 107 107 109  CO2 22 - 32 mmol/L '29 28 27  ' Calcium 8.9 - 10.3 mg/dL 9.3 9.5 9.2  Total Protein 6.5 - 8.1 g/dL 6.8 6.4(L) 6.5  Total Bilirubin 0.3 - 1.2 mg/dL 0.8 0.8 0.6  Alkaline Phos 38 - 126 U/L 120 101 109  AST 15 - 41 U/L '19 25 29  ' ALT 0 - 44 U/L 15 30 46(H)      RADIOGRAPHIC STUDIES: I have personally reviewed the radiological images as listed and agreed with the findings in the report. No results found.   ASSESSMENT & PLAN:  Gabriel Poole is a 73 y.o. male with   1. Primary adenocarcinoma of the pyloric antrum, pT4a, PN3aM0, stage IIIB, Grade3, MSI-stable -Diagnosed in 02/2017. S/p surgical resection and adjuvant chemotherapy FOLFOX.  Patient was not compliant with chemo, oxaliplatin stopped after 4 cycles, and 5-FU stopped after 8 cycles treatment per his request. -current on surveillance.  We discussed is a high risk of recurrence due to he is locally advanced disease. -He is clinically  doing well, asymptomatic, except mild gastric discomfort after a large meal. I suggest him to eat small meal, due to his previous partial gastroectomy. He has not been able to gain weight although he has been eating enough. -His last colonoscopy was sin 2015. I will send message to Dr. Paulita Fujita for him to be seen later this year if he is due for colonoscopy. He is agreeable.  -He is clinically stable overall.  -F/u in 2-3 months   2. Anemia  -resolved  3. Hypokalemia  -Currently on potassium pills daily. Continue.  4.Ascending aortic aneurysm -Monitored on surveillance scans for his gastric cancer   5. Smoking cessation  -He still smokes -I advised him to completely stop smoking and offered joining a smoking cessation program, but states that he has transportation issue. -He will try to cut down  9. BPH and elevated PSA -He has nocturia and urinary frequency, history of BPH, and elevated PSA in July 2019 -He was seen by urologist before, I strongly encouraged him to  make appointment as soon as possible -Prior CT scan findings showed severe prostatomegaly   Plan  -continue surveillance  -f/u in 2-3 months with labs   No problem-specific Assessment & Plan notes found for this encounter.   No orders of the defined types were placed in this encounter.  I discussed the assessment and treatment plan with the patient. The patient was provided an opportunity to ask questions and all were answered. The patient agreed with the plan and demonstrated an understanding of the instructions.  The patient was advised to call back or seek an in-person evaluation if the symptoms worsen or if the condition fails to improve as anticipated.   I provided 15 minutes of non face-to-face telephone visit time during this encounter, and > 50% was spent counseling as documented under my assessment & plan.    Gabriel Merle, MD 08/24/2018   I, Joslyn Devon, am acting as scribe for Gabriel Merle, MD.   I  have reviewed the above documentation for accuracy and completeness, and I agree with the above.

## 2018-08-24 ENCOUNTER — Telehealth: Payer: Self-pay | Admitting: Hematology

## 2018-08-24 ENCOUNTER — Encounter: Payer: Self-pay | Admitting: Hematology

## 2018-08-24 ENCOUNTER — Inpatient Hospital Stay: Payer: Medicare HMO | Attending: Hematology | Admitting: Hematology

## 2018-08-24 DIAGNOSIS — E876 Hypokalemia: Secondary | ICD-10-CM | POA: Diagnosis not present

## 2018-08-24 DIAGNOSIS — F17210 Nicotine dependence, cigarettes, uncomplicated: Secondary | ICD-10-CM | POA: Diagnosis not present

## 2018-08-24 DIAGNOSIS — C162 Malignant neoplasm of body of stomach: Secondary | ICD-10-CM | POA: Diagnosis not present

## 2018-08-24 NOTE — Telephone Encounter (Signed)
No los per 4/15. °

## 2018-08-25 ENCOUNTER — Encounter: Payer: Self-pay | Admitting: General Practice

## 2018-08-25 NOTE — Progress Notes (Signed)
[place] Team contacted patient to assess for food insecurity and other psychosocial needs during current COVID19 pandemic.  Incorrect phone number in record, no way to contact.   Beverely Pace, [place]

## 2018-08-26 ENCOUNTER — Ambulatory Visit: Payer: Medicare HMO | Admitting: Hematology

## 2018-08-26 ENCOUNTER — Other Ambulatory Visit: Payer: Medicare HMO

## 2018-12-01 NOTE — Progress Notes (Signed)
[facility]   [phone] [phone]   Clinic Follow up Note   Patient Care Team: Butler, Cynthia, DO (Inactive) as PCP - General , , MD as Consulting Physician (Hematology) Burton, Lacie K, NP as Nurse Practitioner (Nurse Practitioner) Byerly, Faera, MD as Consulting Physician (General Surgery) Rehman, Najeeb U, MD as Consulting Physician (Gastroenterology) Jacobs, Daniel P, MD as Attending Physician (Gastroenterology)  Date of Service:  12/02/2018  CHIEF COMPLAINT: F/u on gastric cancer  SUMMARY OF ONCOLOGIC HISTORY: Oncology History Overview Note  Cancer Staging 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 Microscopic Comment Several of the fragments are involved by moderately differentiated adenocarcinoma.   02/17/2017 Procedure   UPPER ENDOSCOPY  FINDINGS - Normal esophagus. - Z-line irregular, 44 cm from the incisors. - Red blood in the gastric body and in the gastric antrum. - Large gastric ulcer. Biopsied. - Erythematous mucosa in the antrum. - Normal cardia, gastric fundus, gastric body and pylorus. - Normal duodenal bulb and second portion of the duodenum. Comment: Endoscopic appearence concerning for malignant ulcer.   03/11/2017 Procedure   UPPER EUS PER DR. JACOBS Findings: 1. The esophagus was normal. 2. There was a 2-3cm ulcerated, malignant mass along the greater curvature of the stomach, approximately mid-body. The mass was non-circumferential. 3. The duodenum was  normal. Endosonographic Finding 1. The gastric mass above correlated with a hypoechoic and heterogenous non-circumferential mass that measured 2.9cm across, 7mm deep. The endosonographic borders were poorly defined and there was clear sonographic evidence suggesting invasion into and through the muscularis propria layer without evident invasion into nearby organs (uT3). 2. The duodenal lymphnode described on recent CT scan appeared reactive by US criteria. 3. No perigastric adenopathy (uN0) 4. Limited views of the liver, spleen, pancreas, bile duct, gallbladder were all normal. - Along the greater curvature of the stomach, approximately mid-body, there is a 2.9cm uT3N0 (clinical stage IIB) gastric adenocarcinoma.   04/06/2017 Imaging   CT CHEST IMPRESSION: 1. No acute cardiopulmonary abnormalities. 2. Two small pulmonary nodules are noted measuring up to 5 mm. Nonspecific but warrant attention on follow-up imaging follow up 3. Nonspecific hyperdense, possibly enhancing lesion along the dome of liver is identified. In a patient that is at increased risk for more definitive assessment of this structure with contrast enhanced MRI of the liver is advised.   04/21/2017 Imaging   MR ABDOMEN IMPRESSION: 1. Enhancing 2.9 cm mass along the lesser curvature in the gastric antrum, compatible with known gastric malignancy . 2. Mildly enlarged gastrohepatic ligament lymph node, cannot exclude nodal metastasis. 3. No definite liver metastatic disease. Hyperenhancing 0.9 cm liver dome mass remains inconclusive, although the MRI features are most suggestive of a flash filling hemangioma, and the mass has been stable for nearly 2 months. Additional subcentimeter focus of hyperenhancement in the lateral segment left liver lobe is most likely a benign transient vascular phenomenon. Recommend attention to these lesions on a follow-up MRI abdomen without and with IV contrast in 3-6 months. This  recommendation follows ACR consensus guidelines: Management of Incidental Liver   Lesions on CT: A White Paper of the ACR Incidental Findings Committee. J Am Coll Radiol 2017; 14:1429-1437. 4. Benign right adrenal adenoma.     04/22/2017 Pathology Results   Diagnosis 1. Liver, biopsy - BILE DUCT HAMARTOMA. - THERE IS NO EVIDENCE OF MALIGNANCY. 2. Lymph nodes, regional resection, portal - METASTATIC CARCINOMA IN 2 OF 6 LYMPH NODES (2/6). 3. Stomach, resection for tumor, distal - INVASIVE ADENOCARCINOMA, POORLY DIFFERENTIATED, SPANNING 3.8 CM. - PERINEURAL INVASION IS IDENTIFIED. - ADENOCARCINOMA INVOLVES THE SEROSA. - METASTATIC CARCINOMA IN 12 OF 30 LYMPH NODES (12/30), WITH EXTRACAPSULAR EXTENSION. - SEE ONCOLOGY TABLE BELOW. 4. Lymph node, biopsy, common hepatic artery - THERE IS NO EVIDENCE OF CARCINOMA IN 1 OF 1 LYMPH NODE (0/1). Microscopic Comment 3. STOMACH: Specimen: Stomach. Procedure: Partial gastrectomy. Tumor Site: Greater curvature. Tumor Size: 3.8 cm Histologic Type: Adenocarcinoma. Histologic Grade: G3: poorly differentiated. Microscopic Extent of Tumor: Adenocarcinoma involves the serosa. Margins (select all that apply): Adenoca Proximal Margin: Negative for adenocarcinoma. Distal Margin: Negative for adenocarcinoma. Treatment Effect: N/A Lymph-Vascular Invasion: Not identified. Perineural Invasion: Present. Additional findings: Chronic gastritis. Ancillary testing: Can be performed upon clinician request. 1 of 3 Duplicate copy FINAL for Neas, Ziggy L ([id number]) Microscopic Comment(continued) Lymph nodes: number examined - 37; number positive: 14 Pathologic Staging: pT4a, pN3a (JBK:gt, 04/27/17)   06/02/2017 - 09/30/2017 Adjuvant Chemotherapy   FOLFOX every 2 weeks, oxaliplatin stopped in March 2019 due to neuropathy, chemo stopped on 09/30/2017 per pt's request    10/29/2017 Imaging   10/29/2017 CT CAP  IMPRESSION: 1. Status post distal  gastrectomy. No evidence for metastatic disease in the chest, abdomen, or pelvis. 2. Stable tiny right pulmonary nodules. Continued attention on follow-up recommended. 3. 9 mm hypervascular lesion in the dome of the liver is stable over multiple studies back to 04/05/2017. Continued attention on follow-up suggested. 4. Ascending thoracic aorta measures 4.2 cm diameter. Recommend annual imaging followup by CTA or MRA. This recommendation follows 2010 ACCF/AHA/AATS/ACR/ASA/SCA/SCAI/SIR/STS/SVM Guidelines for the Diagnosis and Management of Patients with Thoracic Aortic Disease. Circulation. 2010; 121: e266-e369 . 5. Marked prostatomegaly. 6.  Aortic Atherosclerois (ICD10-170.0)   05/09/2018 Imaging   05/09/2018 CT CAP IMPRESSION: 1. No definite findings to suggest metastatic disease to the chest, abdomen or pelvis. 2. Multiple small pulmonary nodules scattered throughout the lungs bilaterally measuring 5 mm or less in size, stable compared to prior studies from 2018, favored to be benign. Continued attention on future follow-up examinations is recommended. 3. Aortic atherosclerosis with ectasia of the ascending thoracic aorta (4 cm in diameter). Recommend annual imaging followup by CTA or MRA. This recommendation follows 2010 ACCF/AHA/AATS/ACR/ASA/SCA/SCAI/SIR/STS/SVM Guidelines for the Diagnosis and Management of Patients with Thoracic Aortic Disease. Circulation. 2010; 121: e266-e369. 4. Multiple small calculi lying dependently in the right-side of the urinary bladder. 5. Severe prostatomegaly. 6. Additional incidental findings, as above.      CURRENT THERAPY:  Surveillance  INTERVAL HISTORY:  Gabriel Poole is here for a follow up of gastric cancer. He presents to the clinic alone. He notes feeling weak in the morning but after eating he will feel better. He notes he has not been able to gain weight. He notes having a lot of gas lately. He smokes about 1 cigarette a day.   I called his wife who notes he has mentioned he does not feel well in the inside but no further description and that he does not have much an appetite or eating well.     REVIEW OF SYSTEMS:     Constitutional: Denies fevers, chills or abnormal weight loss Eyes: Denies blurriness of vision Ears, nose, mouth, throat, and face: Denies mucositis or sore throat Respiratory: Denies cough, dyspnea or wheezes Cardiovascular: Denies palpitation, chest discomfort or lower extremity swelling Gastrointestinal:  Denies nausea, heartburn or change in bowel habits (+) gas  Skin: Denies abnormal skin rashes Lymphatics: Denies new lymphadenopathy or easy bruising Neurological:Denies numbness, tingling or new weaknesses Behavioral/Psych: Mood is stable, no new changes  All other systems were reviewed with the patient and are negative.  MEDICAL HISTORY:  Past Medical History:  Diagnosis Date  . Arthritis   . Cancer (HCC) dx oct 02-2017   stomach  . Gout   . Heart murmur   . Hyperlipidemia   . Hypertension   . Stomach cancer (HCC)     SURGICAL HISTORY: Past Surgical History:  Procedure Laterality Date  . COLONOSCOPY    . ESOPHAGOGASTRODUODENOSCOPY N/A 02/17/2017   Procedure: ESOPHAGOGASTRODUODENOSCOPY (EGD);  Surgeon: Rehman, Najeeb U, MD;  Location: [facility];  Service: Endoscopy;  Laterality: N/A;  3:00  . EUS N/A 03/11/2017   Procedure: UPPER ENDOSCOPIC ULTRASOUND (EUS) LINEAR;  Surgeon: Jacobs, Daniel P, MD;  Location: [facility];  Service: Endoscopy;  Laterality: N/A;  . EUS N/A 03/11/2017   Procedure: UPPER ENDOSCOPIC ULTRASOUND (EUS) RADIAL;  Surgeon: Jacobs, Daniel P, MD;  Location: [facility];  Service: Endoscopy;  Laterality: N/A;  . FINGER SURGERY     Lt middle   . GASTRECTOMY N/A 04/22/2017   Procedure: PARTIAL GASTRECTOMY;  Surgeon: Byerly, Faera, MD;  Location: [facility];  Service: General;  Laterality: N/A;  . GASTROJEJUNOSTOMY N/A 04/22/2017   Procedure: JEJUNAL FEEDING TUBE  PLACEMENT;  Surgeon: Byerly, Faera, MD;  Location: [facility];  Service: General;  Laterality: N/A;  . HYDROCELE EXCISION  02/12/2011   Procedure: HYDROCELECTOMY ADULT;  Surgeon: Mohammad I Javaid;  Location: [facility];  Service: Urology;  Laterality: Left;  . LAPAROSCOPY N/A 04/22/2017   Procedure: LAPAROSCOPY DIAGNOSTIC ERAS PATHWAY;  Surgeon: Byerly, Faera, MD;  Location: [facility];  Service: General;  Laterality: N/A;  EPIDURAL  . PORTACATH PLACEMENT N/A 05/28/2017   Procedure: INSERTION PORT-A-CATH ERAS PATHWAY;  Surgeon: Byerly, Faera, MD;  Location: [facility];  Service: General;  Laterality: N/A;    I have reviewed the social history and family history with the patient and they are unchanged from previous note.  ALLERGIES:  is allergic to penicillins.  MEDICATIONS:  Current Outpatient Medications  Medication Sig Dispense Refill  . amLODipine (NORVASC) 10 MG tablet Take 1 tablet (10 mg total) by mouth daily. 90 tablet 0  . aspirin EC 81 MG tablet Take 81 mg by mouth daily.    . atorvastatin (LIPITOR) 40 MG tablet Take 1 tablet (40 mg total) by mouth daily at 6 PM. 90 tablet 0  . metoprolol tartrate (LOPRESSOR) 50 MG tablet Take 50 mg by mouth 2 (two) times daily.    . potassium chloride SA (K-DUR,KLOR-CON) 20 MEQ tablet Take 1 tablet (20 mEq total) by mouth 2 (two) times daily. (Patient taking differently: Take 20 mEq by mouth daily. ) 40 tablet 1  . triamterene-hydrochlorothiazide (MAXZIDE-25) 37.5-25 MG tablet Take 1 tablet by mouth daily.    . Cholecalciferol (VITAMIN D3) 5000 units CAPS Take 5,000 Units by mouth daily.     No current facility-administered medications for this visit.     PHYSICAL EXAMINATION: ECOG PERFORMANCE STATUS: 1 - Symptomatic but completely ambulatory  Vitals:   12/02/18 1311    BP: 131/69  Pulse: (!) 54  Resp: 17  Temp: 98.3 F (36.8 C)  SpO2: 100%   Filed Weights   12/02/18 1311  Weight: 146 lb (66.2 kg)    GENERAL:alert, no distress and  comfortable SKIN: skin color, texture, turgor are normal, no rashes or significant lesions EYES: normal, Conjunctiva are pink and non-injected, sclera clear  NECK: supple, thyroid normal size, non-tender, without nodularity LYMPH:  no palpable lymphadenopathy in the cervical, axillary  LUNGS: clear to auscultation and percussion with normal breathing effort HEART: regular rate & rhythm and no murmurs and no lower extremity edema ABDOMEN:abdomen soft, non-tender and normal bowel sounds (+) midline incision tenderness  Musculoskeletal:no cyanosis of digits and no clubbing  NEURO: alert & oriented x 3 with fluent speech, no focal motor/sensory deficits  LABORATORY DATA:  I have reviewed the data as listed CBC Latest Ref Rng & Units 12/02/2018 05/26/2018 05/09/2018  WBC 4.0 - 10.5 K/uL 7.9 5.4 5.7  Hemoglobin 13.0 - 17.0 g/dL 12.2(L) 13.4 13.5  Hematocrit 39.0 - 52.0 % 38.8(L) 42.9 43.6  Platelets 150 - 400 K/uL 231 184 208     CMP Latest Ref Rng & Units 12/02/2018 05/26/2018 05/09/2018  Glucose 70 - 99 mg/dL 51(L) 78 84  BUN 8 - 23 mg/dL _0 Creatinine 0.61 - 1.24 mg/dL 1.04 1.01 1.06  Sodium 135 - 145 mmol/L 142 143 143  Potassium 3.5 - 5.1 mmol/L 4.1 4.2 4.1  Chloride 98 - 111 mmol/L 108 107 107  CO2 22 - 32 mmol/L _1 Calcium 8.9 - 10.3 mg/dL 9.4 9.3 9.5  Total Protein 6.5 - 8.1 g/dL 6.4(L) 6.8 6.4(L)  Total Bilirubin 0.3 - 1.2 mg/dL 0.5 0.8 0.8  Alkaline Phos 38 - 126 U/L 101 120 101  AST 15 - 41 U/L _2 ALT 0 - 44 U/L 38 15 30      RADIOGRAPHIC STUDIES: I have personally reviewed the radiological images as listed and agreed with the findings in the report. No results found.   ASSESSMENT & PLAN:  Gabriel Poole is a 73 y.o. male with   1. Primary adenocarcinoma of the pyloric antrum, pT4a, PN3aM0, stage IIIB, Grade3, MSI-stable -Diagnosed in 02/2017.S/psurgical resection and adjuvant chemotherapyFOLFOX. Patient was not compliant with chemo,  oxaliplatin stopped after 4 cycles, and 5-FU stopped after 8 cycles treatment per his request. -current onsurveillance. We discussed is a high risk of recurrence due to he is locally advanced disease. -His last colonoscopy was in 2015. I will send message to Dr. Paulita Fujita for him to be seen later this year if he is due for colonoscopy. He is agreeable.  -He is clinically doing stable, asymptomatic, except gas and slightly low appetite, weight is stable. I reviewed reducing fiber in diet. He has not been able to gain weight although he has been eating enough, stable mostly. Will monitor his eating and weight.  -Physical exam unremarkable. Will proceed with labs today.  -F/u in 6 months with lab and CT CAP with contrast a few days before, sooner if he develops new symptoms   2.Ascending aortic aneurysm -Monitored on surveillance scans for his gastric cancer  3. Smokingcessation -He smokes less now, 1 cigarette a day occasionally.  -I advised him to completely stop smoking and offered joining a smoking cessation program, but states that he has transportation issue.  4. BPH and elevated PSA -He has nocturia and urinary frequency, history of BPH, and elevated PSA in  July 2019 -He was seen by urologist before, will f/u  -Prior CT scan findings showed severe prostatomegaly  Plan -Lab today  -continue surveillance  -Lab and f/u in 6 months with CT CAP with contrast a few days before, or sooner if needed  -I spoke with his wife today, she is concerned about his low appetite, will watch him and call me if it gets worse    No problem-specific Assessment & Plan notes found for this encounter.   Orders Placed This Encounter  Procedures  . CT Abdomen Pelvis W Contrast    Standing Status:   Future    Standing Expiration Date:   12/02/2019    Order Specific Question:   If indicated for the ordered procedure, I authorize the administration of contrast media per Radiology protocol    Answer:    Yes    Order Specific Question:   Preferred imaging location?    Answer:   [facility]    Order Specific Question:   Is Oral Contrast requested for this exam?    Answer:   Yes, Per Radiology protocol    Order Specific Question:   Radiology Contrast Protocol - do NOT remove file path    Answer:   _0 [email or web address]  . CT Chest W Contrast    Standing Status:   Future    Standing Expiration Date:   12/02/2019    Order Specific Question:   If indicated for the ordered procedure, I authorize the administration of contrast media per Radiology protocol    Answer:   Yes    Order Specific Question:   Preferred imaging location?    Answer:   [facility]    Order Specific Question:   Radiology Contrast Protocol - do NOT remove file path    Answer:   _1 [email or web address]   All questions were answered. The patient knows to call the clinic with any problems, questions or concerns. No barriers to learning was detected. I spent 20 minutes counseling the patient face to face. The total time spent in the appointment was 25 minutes and more than 50% was on counseling and review of test results     Truitt Merle, MD 12/02/2018   I, Joslyn Devon, am acting as scribe for Truitt Merle, MD.   I have reviewed the above documentation for accuracy and completeness, and I agree with the above.

## 2018-12-02 ENCOUNTER — Inpatient Hospital Stay: Payer: Medicare Other | Attending: Hematology | Admitting: Hematology

## 2018-12-02 ENCOUNTER — Encounter: Payer: Self-pay | Admitting: Hematology

## 2018-12-02 ENCOUNTER — Inpatient Hospital Stay: Payer: Medicare Other

## 2018-12-02 ENCOUNTER — Other Ambulatory Visit: Payer: Self-pay

## 2018-12-02 ENCOUNTER — Telehealth: Payer: Self-pay | Admitting: Hematology

## 2018-12-02 DIAGNOSIS — N401 Enlarged prostate with lower urinary tract symptoms: Secondary | ICD-10-CM | POA: Insufficient documentation

## 2018-12-02 DIAGNOSIS — C162 Malignant neoplasm of body of stomach: Secondary | ICD-10-CM

## 2018-12-02 DIAGNOSIS — Z79899 Other long term (current) drug therapy: Secondary | ICD-10-CM | POA: Insufficient documentation

## 2018-12-02 DIAGNOSIS — F17210 Nicotine dependence, cigarettes, uncomplicated: Secondary | ICD-10-CM | POA: Diagnosis not present

## 2018-12-02 DIAGNOSIS — Z85028 Personal history of other malignant neoplasm of stomach: Secondary | ICD-10-CM | POA: Diagnosis not present

## 2018-12-02 DIAGNOSIS — D649 Anemia, unspecified: Secondary | ICD-10-CM

## 2018-12-02 LAB — CBC WITH DIFFERENTIAL (CANCER CENTER ONLY)
Abs Immature Granulocytes: 0.03 10*3/uL (ref 0.00–0.07)
Basophils Absolute: 0.1 10*3/uL (ref 0.0–0.1)
Basophils Relative: 1 %
Eosinophils Absolute: 0.2 10*3/uL (ref 0.0–0.5)
Eosinophils Relative: 2 %
HCT: 38.8 % — ABNORMAL LOW (ref 39.0–52.0)
Hemoglobin: 12.2 g/dL — ABNORMAL LOW (ref 13.0–17.0)
Immature Granulocytes: 0 %
Lymphocytes Relative: 27 %
Lymphs Abs: 2.1 10*3/uL (ref 0.7–4.0)
MCH: 29.6 pg (ref 26.0–34.0)
MCHC: 31.4 g/dL (ref 30.0–36.0)
MCV: 94.2 fL (ref 80.0–100.0)
Monocytes Absolute: 0.6 10*3/uL (ref 0.1–1.0)
Monocytes Relative: 8 %
Neutro Abs: 4.9 10*3/uL (ref 1.7–7.7)
Neutrophils Relative %: 62 %
Platelet Count: 231 10*3/uL (ref 150–400)
RBC: 4.12 MIL/uL — ABNORMAL LOW (ref 4.22–5.81)
RDW: 13.1 % (ref 11.5–15.5)
WBC Count: 7.9 10*3/uL (ref 4.0–10.5)
nRBC: 0 % (ref 0.0–0.2)

## 2018-12-02 LAB — CMP (CANCER CENTER ONLY)
ALT: 38 U/L (ref 0–44)
AST: 29 U/L (ref 15–41)
Albumin: 3.8 g/dL (ref 3.5–5.0)
Alkaline Phosphatase: 101 U/L (ref 38–126)
Anion gap: 6 (ref 5–15)
BUN: 15 mg/dL (ref 8–23)
CO2: 28 mmol/L (ref 22–32)
Calcium: 9.4 mg/dL (ref 8.9–10.3)
Chloride: 108 mmol/L (ref 98–111)
Creatinine: 1.04 mg/dL (ref 0.61–1.24)
GFR, Est AFR Am: >60 mL/min (ref >60)
GFR, Estimated: >60 mL/min (ref >60)
Glucose, Bld: 51 mg/dL — ABNORMAL LOW (ref 70–99)
Potassium: 4.1 mmol/L (ref 3.5–5.1)
Sodium: 142 mmol/L (ref 135–145)
Total Bilirubin: 0.5 mg/dL (ref 0.3–1.2)
Total Protein: 6.4 g/dL — ABNORMAL LOW (ref 6.5–8.1)

## 2018-12-02 NOTE — Telephone Encounter (Signed)
Scheduled appt per 7/24 los.  Printed calendar and avs.

## 2018-12-05 LAB — IRON AND TIBC
Iron: 56 ug/dL (ref 42–163)
Saturation Ratios: 18 % — ABNORMAL LOW (ref 20–55)
TIBC: 315 ug/dL (ref 202–409)
UIBC: 259 ug/dL (ref 117–376)

## 2018-12-05 LAB — FERRITIN: Ferritin: 43 ng/mL (ref 24–336)

## 2019-02-09 ENCOUNTER — Telehealth: Payer: Self-pay | Admitting: *Deleted

## 2019-02-09 ENCOUNTER — Telehealth: Payer: Self-pay | Admitting: Hematology

## 2019-02-09 ENCOUNTER — Other Ambulatory Visit: Payer: Self-pay | Admitting: *Deleted

## 2019-02-09 NOTE — Telephone Encounter (Signed)
Scheduled appt per 10/1 sch message - pt daughter is aware of appt date and time

## 2019-02-09 NOTE — Telephone Encounter (Signed)
Received call from pt's contact person, Raynen Farbman.   She states that pt wanted her to call and get an appt for him next week. He is not feeling well-no appetite, eating maybe just 1 meal a day and losing weight.  Pt's next appt is not until January 2021.  Scheduling message sent for next week with labs.

## 2019-02-14 ENCOUNTER — Telehealth: Payer: Self-pay | Admitting: Hematology

## 2019-02-14 NOTE — Telephone Encounter (Signed)
Returned patient's phone call regarding rescheduling an appointment, spoke with patient's wife and per request 10/07 appointment has moved to 10/15.

## 2019-02-15 ENCOUNTER — Inpatient Hospital Stay: Payer: Medicare HMO | Admitting: Hematology

## 2019-02-15 ENCOUNTER — Inpatient Hospital Stay: Payer: Medicare HMO

## 2019-02-23 ENCOUNTER — Inpatient Hospital Stay: Payer: Medicare HMO | Attending: Hematology

## 2019-02-23 ENCOUNTER — Inpatient Hospital Stay: Payer: Medicare HMO | Admitting: Nurse Practitioner

## 2019-02-23 NOTE — Progress Notes (Deleted)
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 Microscopic Comment Several of the fragments are involved by moderately differentiated adenocarcinoma.   02/17/2017 Procedure   UPPER ENDOSCOPY  FINDINGS - Normal esophagus. - Z-line irregular, 44 cm from the incisors. - Red blood in the gastric body and in the gastric antrum. - Large gastric ulcer. Biopsied. - Erythematous mucosa in the antrum. - Normal cardia, gastric fundus, gastric body and pylorus. - Normal duodenal bulb and second portion of the duodenum. Comment: Endoscopic appearence concerning for malignant ulcer.   03/11/2017 Procedure   UPPER EUS PER DR. Ardis Hughs Findings: 1. The esophagus was normal. 2. There was a 2-3cm ulcerated, malignant mass along the greater curvature of the stomach, approximately mid-body. The mass was non-circumferential. 3. The duodenum was normal. Endosonographic  Finding 1. The gastric mass above correlated with a hypoechoic and heterogenous non-circumferential mass that measured 2.9cm across, 74mm deep. The endosonographic borders were poorly defined and there was clear sonographic evidence suggesting invasion into and through the muscularis propria layer without evident invasion into nearby organs (uT3). 2. The duodenal lymphnode described on recent CT scan appeared reactive by [place] criteria. 3. No perigastric adenopathy (uN0) 4. Limited views of the liver, spleen, pancreas, bile duct, gallbladder were all normal. - Along the greater curvature of the stomach, approximately mid-body, there is a 2.9cm uT3N0 (clinical stage IIB) gastric adenocarcinoma.   04/06/2017 Imaging   CT CHEST IMPRESSION: 1. No acute cardiopulmonary abnormalities. 2. Two small pulmonary nodules are noted measuring up to 5 mm. Nonspecific but warrant attention on follow-up imaging follow up 3. Nonspecific hyperdense, possibly enhancing lesion along the dome of liver is identified. In a patient that is at increased risk for more definitive assessment of this structure with contrast enhanced MRI of the liver is advised.   04/21/2017 Imaging   MR ABDOMEN IMPRESSION: 1. Enhancing 2.9 cm mass along the lesser curvature in the gastric antrum, compatible with known gastric malignancy . 2. Mildly enlarged gastrohepatic ligament lymph node, cannot exclude nodal metastasis. 3. No definite liver metastatic disease. Hyperenhancing 0.9 cm liver dome mass remains inconclusive, although the MRI features are most suggestive of a flash filling hemangioma, and the mass has been stable for nearly 2 months. Additional subcentimeter focus of hyperenhancement in the lateral segment left liver lobe is most likely a benign transient vascular phenomenon. Recommend attention to these lesions on a follow-up MRI abdomen without and with IV contrast in 3-6 months. This recommendation follows ACR  consensus guidelines: Management of Incidental Liver Lesions on CT: A  White Paper of the ACR Incidental Findings Committee. J Am Coll Radiol [id number]. 4. Benign right adrenal adenoma.     04/22/2017 Pathology Results   Diagnosis 1. Liver, biopsy - BILE DUCT HAMARTOMA. - THERE IS NO EVIDENCE OF MALIGNANCY. 2. Lymph nodes, regional resection, portal - METASTATIC CARCINOMA IN 2 OF 6 LYMPH NODES (2/6). 3. Stomach, resection for tumor, distal - INVASIVE ADENOCARCINOMA, POORLY DIFFERENTIATED, SPANNING 3.8 CM. - PERINEURAL INVASION IS IDENTIFIED. - ADENOCARCINOMA INVOLVES THE SEROSA. - METASTATIC CARCINOMA IN 12 OF 30 LYMPH NODES (12/30), WITH EXTRACAPSULAR EXTENSION. - SEE ONCOLOGY TABLE BELOW. 4. Lymph node, biopsy, common hepatic artery - THERE IS NO EVIDENCE OF CARCINOMA IN 1 OF 1 LYMPH NODE (0/1). Microscopic Comment 3. STOMACH: Specimen: Stomach. Procedure: Partial gastrectomy. Tumor Site: Greater curvature. Tumor Size: 3.8 cm Histologic Type: Adenocarcinoma. Histologic Grade: G3: poorly differentiated. Microscopic Extent of Tumor: Adenocarcinoma involves the serosa. Margins (select all that apply): Adenoca Proximal Margin: Negative for adenocarcinoma. Distal Margin: Negative for adenocarcinoma. Treatment Effect: N/A Lymph-Vascular Invasion: Not identified. Perineural Invasion: Present. Additional findings: Chronic gastritis. Ancillary testing: Can be performed upon clinician request. 1 of 3 Duplicate copy FINAL for ROQUE, RECALDE [id number]) Microscopic Comment(continued) Lymph nodes: number examined - 37; number positive: 14 Pathologic Staging: pT4a, pN3a (JBK:gt, 04/27/17)   06/02/2017 - 09/30/2017 Adjuvant Chemotherapy   FOLFOX every 2 weeks, oxaliplatin stopped in March 2019 due to neuropathy, chemo stopped on 09/30/2017 per pt's request    10/29/2017 Imaging   10/29/2017 CT CAP  IMPRESSION: 1. Status post distal gastrectomy. No evidence for  metastatic disease in the chest, abdomen, or pelvis. 2. Stable tiny right pulmonary nodules. Continued attention on follow-up recommended. 3. 9 mm hypervascular lesion in the dome of the liver is stable over multiple studies back to 04/05/2017. Continued attention on follow-up suggested. 4. Ascending thoracic aorta measures 4.2 cm diameter. Recommend annual imaging followup by CTA or MRA. This recommendation follows 2010 ACCF/AHA/AATS/ACR/ASA/SCA/SCAI/SIR/STS/SVM Guidelines for the Diagnosis and Management of Patients with Thoracic Aortic Disease. Circulation. 2010; 121: [id number] . 5. Marked prostatomegaly. 6.  Aortic Atherosclerois (ICD10-170.0)   05/09/2018 Imaging   05/09/2018 CT CAP IMPRESSION: 1. No definite findings to suggest metastatic disease to the chest, abdomen or pelvis. 2. Multiple small pulmonary nodules scattered throughout the lungs bilaterally measuring 5 mm or less in size, stable compared to prior studies from 2018, favored to be benign. Continued attention on future follow-up examinations is recommended. 3. Aortic atherosclerosis with ectasia of the ascending thoracic aorta (4 cm in diameter). Recommend annual imaging followup by CTA or MRA. This recommendation follows 2010 ACCF/AHA/AATS/ACR/ASA/SCA/SCAI/SIR/STS/SVM Guidelines for the Diagnosis and Management of Patients with Thoracic Aortic Disease. Circulation. 2010; 121: [id number]. 4. Multiple small calculi lying dependently in the right-side of the urinary bladder. 5. Severe prostatomegaly. 6. Additional incidental findings, as above.     CURRENT THERAPY: Observation   INTERVAL HISTORY: Mr. Gabriel Poole presents before his scheduled visit due to his wife's concern for decreased appetite and weight loss.    REVIEW OF SYSTEMS:   Constitutional: Denies fevers, chills or abnormal weight loss Eyes: Denies blurriness of vision Ears, nose, mouth, throat, and face: Denies mucositis or sore throat Respiratory:  Denies cough, dyspnea or wheezes Cardiovascular: Denies palpitation, chest discomfort or lower extremity swelling Gastrointestinal:  Denies nausea, heartburn or change in bowel habits Skin: Denies abnormal skin rashes Lymphatics: Denies new lymphadenopathy or easy bruising Neurological:Denies numbness, tingling or new weaknesses Behavioral/Psych: Mood is stable, no new changes  All other systems  were reviewed with the patient and are negative.  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)     SURGICAL HISTORY: Past Surgical History:  Procedure Laterality Date   COLONOSCOPY     ESOPHAGOGASTRODUODENOSCOPY N/A 02/17/2017   Procedure: ESOPHAGOGASTRODUODENOSCOPY (EGD);  Surgeon: Rogene Houston, MD;  [facility];  Service: Endoscopy;  Laterality: N/A;  3:00   EUS N/A 03/11/2017   Procedure: UPPER ENDOSCOPIC ULTRASOUND (EUS) LINEAR;  Surgeon: Milus Banister, MD;  Location: [facility];  Service: Endoscopy;  Laterality: N/A;   EUS N/A 03/11/2017   Procedure: UPPER ENDOSCOPIC ULTRASOUND (EUS) RADIAL;  Surgeon: Milus Banister, MD;  Location: [facility];  Service: Endoscopy;  Laterality: N/A;   FINGER SURGERY     Lt middle    GASTRECTOMY N/A 04/22/2017   Procedure: PARTIAL GASTRECTOMY;  Surgeon: Stark Klein, MD;  Location: [place];  Service: General;  Laterality: N/A;   GASTROJEJUNOSTOMY N/A 04/22/2017   Procedure: JEJUNAL FEEDING TUBE PLACEMENT;  Surgeon: Stark Klein, MD;  Location: [place];  Service: General;  Laterality: N/A;   HYDROCELE EXCISION  02/12/2011   Procedure: HYDROCELECTOMY ADULT;  Surgeon: Marissa Nestle;  Location: [facility];  Service: Urology;  Laterality: Left;   LAPAROSCOPY N/A 04/22/2017   Procedure: LAPAROSCOPY DIAGNOSTIC ERAS PATHWAY;  Surgeon: Stark Klein, MD;  Location: [place];   Service: General;  Laterality: N/A;  EPIDURAL   PORTACATH PLACEMENT N/A 05/28/2017   Procedure: [place];  Surgeon: Stark Klein, MD;  Location: [place];  Service: General;  Laterality: N/A;    I have reviewed the social history and family history with the patient and they are unchanged from previous note.  ALLERGIES:  is allergic to penicillins.  MEDICATIONS:  Current Outpatient Medications  Medication Sig Dispense Refill   amLODipine (NORVASC) 10 MG tablet Take 1 tablet (10 mg total) by mouth daily. 90 tablet 0   aspirin EC 81 MG tablet Take 81 mg by mouth daily.     atorvastatin (LIPITOR) 40 MG tablet Take 1 tablet (40 mg total) by mouth daily at 6 PM. 90 tablet 0   Cholecalciferol (VITAMIN D3) 5000 units CAPS Take 5,000 Units by mouth daily.     metoprolol tartrate (LOPRESSOR) 50 MG tablet Take 50 mg by mouth 2 (two) times daily.     potassium chloride SA (K-DUR,KLOR-CON) 20 MEQ tablet Take 1 tablet (20 mEq total) by mouth 2 (two) times daily. (Patient taking differently: Take 20 mEq by mouth daily. ) 40 tablet 1   triamterene-hydrochlorothiazide (MAXZIDE-25) 37.5-25 MG tablet Take 1 tablet by mouth daily.     No current facility-administered medications for this visit.     PHYSICAL EXAMINATION: ECOG PERFORMANCE STATUS: {CHL ONC ECOG PS:[phone]}  There were no vitals filed for this visit. There were no vitals filed for this visit.  GENERAL:alert, no distress and comfortable SKIN: skin color, texture, turgor are normal, no rashes or significant lesions EYES: normal, Conjunctiva are pink and non-injected, sclera clear OROPHARYNX:no exudate, no erythema and lips, buccal mucosa, and tongue normal  NECK: supple, thyroid normal size, non-tender, without nodularity LYMPH:  no palpable lymphadenopathy in the cervical, axillary or inguinal LUNGS: clear to auscultation and percussion with normal breathing effort HEART: regular rate  &  rhythm and no murmurs and no lower extremity edema ABDOMEN:abdomen soft, non-tender and normal bowel sounds Musculoskeletal:no cyanosis of digits and no clubbing  NEURO: alert & oriented x 3 with fluent speech, no focal motor/sensory deficits  LABORATORY DATA:  I have reviewed the data as listed CBC Latest Ref Rng & Units 12/02/2018 05/26/2018 05/09/2018  WBC 4.0 - 10.5 K/uL 7.9 5.4 5.7  Hemoglobin 13.0 - 17.0 g/dL 12.2(L) 13.4 13.5  Hematocrit 39.0 - 52.0 % 38.8(L) 42.9 43.6  Platelets 150 - 400 K/uL 231 184 208     CMP Latest Ref Rng & Units 12/02/2018 05/26/2018 05/09/2018  Glucose 70 - 99 mg/dL 51(L) 78 84  BUN 8 - 23 mg/dL 15 10 11   Creatinine 0.61 - 1.24 mg/dL 1.04 1.01 1.06  Sodium 135 - 145 mmol/L 142 143 143  Potassium 3.5 - 5.1 mmol/L 4.1 4.2 4.1  Chloride 98 - 111 mmol/L 108 107 107  CO2 22 - 32 mmol/L 28 29 28   Calcium 8.9 - 10.3 mg/dL 9.4 9.3 9.5  Total Protein 6.5 - 8.1 g/dL 6.4(L) 6.8 6.4(L)  Total Bilirubin 0.3 - 1.2 mg/dL 0.5 0.8 0.8  Alkaline Phos 38 - 126 U/L 101 120 101  AST 15 - 41 U/L 29 19 25   ALT 0 - 44 U/L 38 15 30      RADIOGRAPHIC STUDIES: I have personally reviewed the radiological images as listed and agreed with the findings in the report. No results found.   ASSESSMENT & PLAN:  No problem-specific Assessment & Plan notes found for this encounter.   No orders of the defined types were placed in this encounter.  All questions were answered. The patient knows to call the clinic with any problems, questions or concerns. No barriers to learning was detected. I spent {CHL ONC TIME VISIT - [id number] counseling the patient face to face. The total time spent in the appointment was {CHL ONC TIME VISIT - [id number] and more than 50% was on counseling and review of test results     Alla Feeling, NP 02/23/19

## 2019-02-24 ENCOUNTER — Telehealth: Payer: Self-pay | Admitting: Hematology

## 2019-02-24 NOTE — Telephone Encounter (Signed)
Per 10/15 schedule message rescheduled 10/15 appointments. Not able to reach patient by phone. Scheduled mailed.

## 2019-03-12 NOTE — Progress Notes (Deleted)
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 Microscopic Comment Several of the fragments are involved by moderately differentiated adenocarcinoma.   02/17/2017 Procedure   UPPER ENDOSCOPY  FINDINGS - Normal esophagus. - Z-line irregular, 44 cm from the incisors. - Red blood in the gastric body and in the gastric antrum. - Large gastric ulcer. Biopsied. - Erythematous mucosa in the antrum. - Normal cardia, gastric fundus, gastric body and pylorus. - Normal duodenal bulb and second portion of the duodenum. Comment: Endoscopic appearence concerning for malignant ulcer.   03/11/2017 Procedure   UPPER EUS PER DR. Ardis Hughs Findings: 1. The esophagus was normal. 2. There was a 2-3cm ulcerated, malignant mass along the greater curvature of the stomach, approximately mid-body. The mass was non-circumferential. 3. The duodenum was normal. Endosonographic Finding 1. The  gastric mass above correlated with a hypoechoic and heterogenous non-circumferential mass that measured 2.9cm across, 32mm deep. The endosonographic borders were poorly defined and there was clear sonographic evidence suggesting invasion into and through the muscularis propria layer without evident invasion into nearby organs (uT3). 2. The duodenal lymphnode described on recent CT scan appeared reactive by [place] criteria. 3. No perigastric adenopathy (uN0) 4. Limited views of the liver, spleen, pancreas, bile duct, gallbladder were all normal. - Along the greater curvature of the stomach, approximately mid-body, there is a 2.9cm uT3N0 (clinical stage IIB) gastric adenocarcinoma.   04/06/2017 Imaging   CT CHEST IMPRESSION: 1. No acute cardiopulmonary abnormalities. 2. Two small pulmonary nodules are noted measuring up to 5 mm. Nonspecific but warrant attention on follow-up imaging follow up 3. Nonspecific hyperdense, possibly enhancing lesion along the dome of liver is identified. In a patient that is at increased risk for more definitive assessment of this structure with contrast enhanced MRI of the liver is advised.   04/21/2017 Imaging   MR ABDOMEN IMPRESSION: 1. Enhancing 2.9 cm mass along the lesser curvature in the gastric antrum, compatible with known gastric malignancy . 2. Mildly enlarged gastrohepatic ligament lymph node, cannot exclude nodal metastasis. 3. No definite liver metastatic disease. Hyperenhancing 0.9 cm liver dome mass remains inconclusive, although the MRI features are most suggestive of a flash filling hemangioma, and the mass has been stable for nearly 2 months. Additional subcentimeter focus of hyperenhancement in the lateral segment left liver lobe is most likely a benign transient vascular phenomenon. Recommend attention to these lesions on a follow-up MRI abdomen without and with IV contrast in 3-6 months. This recommendation follows ACR consensus guidelines:  Management of Incidental Liver Lesions on CT: A White  Paper of the ACR Incidental Findings Committee. J Am Coll Radiol [id number]. 4. Benign right adrenal adenoma.     04/22/2017 Pathology Results   Diagnosis 1. Liver, biopsy - BILE DUCT HAMARTOMA. - THERE IS NO EVIDENCE OF MALIGNANCY. 2. Lymph nodes, regional resection, portal - METASTATIC CARCINOMA IN 2 OF 6 LYMPH NODES (2/6). 3. Stomach, resection for tumor, distal - INVASIVE ADENOCARCINOMA, POORLY DIFFERENTIATED, SPANNING 3.8 CM. - PERINEURAL INVASION IS IDENTIFIED. - ADENOCARCINOMA INVOLVES THE SEROSA. - METASTATIC CARCINOMA IN 12 OF 30 LYMPH NODES (12/30), WITH EXTRACAPSULAR EXTENSION. - SEE ONCOLOGY TABLE BELOW. 4. Lymph node, biopsy, common hepatic artery - THERE IS NO EVIDENCE OF CARCINOMA IN 1 OF 1 LYMPH NODE (0/1). Microscopic Comment 3. STOMACH: Specimen: Stomach. Procedure: Partial gastrectomy. Tumor Site: Greater curvature. Tumor Size: 3.8 cm Histologic Type: Adenocarcinoma. Histologic Grade: G3: poorly differentiated. Microscopic Extent of Tumor: Adenocarcinoma involves the serosa. Margins (select all that apply): Adenoca Proximal Margin: Negative for adenocarcinoma. Distal Margin: Negative for adenocarcinoma. Treatment Effect: N/A Lymph-Vascular Invasion: Not identified. Perineural Invasion: Present. Additional findings: Chronic gastritis. Ancillary testing: Can be performed upon clinician request. 1 of 3 Duplicate copy FINAL for AMARION, REEVES [id number]) Microscopic Comment(continued) Lymph nodes: number examined - 37; number positive: 14 Pathologic Staging: pT4a, pN3a (JBK:gt, 04/27/17)   06/02/2017 - 09/30/2017 Adjuvant Chemotherapy   FOLFOX every 2 weeks, oxaliplatin stopped in March 2019 due to neuropathy, chemo stopped on 09/30/2017 per pt's request    10/29/2017 Imaging   10/29/2017 CT CAP  IMPRESSION: 1. Status post distal gastrectomy. No evidence for metastatic disease in the  chest, abdomen, or pelvis. 2. Stable tiny right pulmonary nodules. Continued attention on follow-up recommended. 3. 9 mm hypervascular lesion in the dome of the liver is stable over multiple studies back to 04/05/2017. Continued attention on follow-up suggested. 4. Ascending thoracic aorta measures 4.2 cm diameter. Recommend annual imaging followup by CTA or MRA. This recommendation follows 2010 ACCF/AHA/AATS/ACR/ASA/SCA/SCAI/SIR/STS/SVM Guidelines for the Diagnosis and Management of Patients with Thoracic Aortic Disease. Circulation. 2010; 121: [id number] . 5. Marked prostatomegaly. 6.  Aortic Atherosclerois (ICD10-170.0)   05/09/2018 Imaging   05/09/2018 CT CAP IMPRESSION: 1. No definite findings to suggest metastatic disease to the chest, abdomen or pelvis. 2. Multiple small pulmonary nodules scattered throughout the lungs bilaterally measuring 5 mm or less in size, stable compared to prior studies from 2018, favored to be benign. Continued attention on future follow-up examinations is recommended. 3. Aortic atherosclerosis with ectasia of the ascending thoracic aorta (4 cm in diameter). Recommend annual imaging followup by CTA or MRA. This recommendation follows 2010 ACCF/AHA/AATS/ACR/ASA/SCA/SCAI/SIR/STS/SVM Guidelines for the Diagnosis and Management of Patients with Thoracic Aortic Disease. Circulation. 2010; 121: [id number]. 4. Multiple small calculi lying dependently in the right-side of the urinary bladder. 5. Severe prostatomegaly. 6. Additional incidental findings, as above.     CURRENT THERAPY: Surveillance  INTERVAL HISTORY: Mr. Barno returns for f/u. He was last seen in 11/2018. His wife called on 10/1 asking for an appointment, but did not come on 10/15.    REVIEW OF SYSTEMS:   Constitutional: Denies fevers, chills or abnormal weight loss Eyes: Denies blurriness of vision Ears, nose, mouth, throat, and face: Denies mucositis or sore throat Respiratory:  Denies cough, dyspnea or wheezes Cardiovascular: Denies palpitation, chest discomfort or lower extremity swelling Gastrointestinal:  Denies nausea, heartburn or change in bowel habits Skin: Denies abnormal skin rashes Lymphatics: Denies new lymphadenopathy or easy bruising Neurological:Denies numbness, tingling or new weaknesses Behavioral/Psych: Mood is stable, no  new changes  All other systems were reviewed with the patient and are negative.  MEDICAL HISTORY:  Past Medical History:  Diagnosis Date   Arthritis    Cancer [facility]) dx oct 02-2017   stomach   Gout    Heart murmur    Hyperlipidemia    Hypertension    Stomach cancer [facility])     SURGICAL HISTORY: Past Surgical History:  Procedure Laterality Date   COLONOSCOPY     ESOPHAGOGASTRODUODENOSCOPY N/A 02/17/2017   Procedure: ESOPHAGOGASTRODUODENOSCOPY (EGD);  Surgeon: Rogene Houston, MD;  [facility];  Service: Endoscopy;  Laterality: N/A;  3:00   EUS N/A 03/11/2017   Procedure: UPPER ENDOSCOPIC ULTRASOUND (EUS) LINEAR;  Surgeon: Milus Banister, MD;  Location: [facility];  Service: Endoscopy;  Laterality: N/A;   EUS N/A 03/11/2017   Procedure: UPPER ENDOSCOPIC ULTRASOUND (EUS) RADIAL;  Surgeon: Milus Banister, MD;  Location: [facility];  Service: Endoscopy;  Laterality: N/A;   FINGER SURGERY     Lt middle    GASTRECTOMY N/A 04/22/2017   Procedure: PARTIAL GASTRECTOMY;  Surgeon: Stark Klein, MD;  Location: [place];  Service: General;  Laterality: N/A;   GASTROJEJUNOSTOMY N/A 04/22/2017   Procedure: JEJUNAL FEEDING TUBE PLACEMENT;  Surgeon: Stark Klein, MD;  Location: [place];  Service: General;  Laterality: N/A;   HYDROCELE EXCISION  02/12/2011   Procedure: HYDROCELECTOMY ADULT;  Surgeon: Marissa Nestle;  Location: [facility];  Service: Urology;  Laterality: Left;   LAPAROSCOPY N/A 04/22/2017   Procedure: LAPAROSCOPY DIAGNOSTIC ERAS PATHWAY;  Surgeon: Stark Klein, MD;  Location: [place];   Service: General;  Laterality: N/A;  EPIDURAL   PORTACATH PLACEMENT N/A 05/28/2017   Procedure: [place];  Surgeon: Stark Klein, MD;  Location: [place];  Service: General;  Laterality: N/A;    I have reviewed the social history and family history with the patient and they are unchanged from previous note.  ALLERGIES:  is allergic to penicillins.  MEDICATIONS:  Current Outpatient Medications  Medication Sig Dispense Refill   amLODipine (NORVASC) 10 MG tablet Take 1 tablet (10 mg total) by mouth daily. 90 tablet 0   aspirin EC 81 MG tablet Take 81 mg by mouth daily.     atorvastatin (LIPITOR) 40 MG tablet Take 1 tablet (40 mg total) by mouth daily at 6 PM. 90 tablet 0   Cholecalciferol (VITAMIN D3) 5000 units CAPS Take 5,000 Units by mouth daily.     metoprolol tartrate (LOPRESSOR) 50 MG tablet Take 50 mg by mouth 2 (two) times daily.     potassium chloride SA (K-DUR,KLOR-CON) 20 MEQ tablet Take 1 tablet (20 mEq total) by mouth 2 (two) times daily. (Patient taking differently: Take 20 mEq by mouth daily. ) 40 tablet 1   triamterene-hydrochlorothiazide (MAXZIDE-25) 37.5-25 MG tablet Take 1 tablet by mouth daily.     No current facility-administered medications for this visit.     PHYSICAL EXAMINATION: ECOG PERFORMANCE STATUS: {CHL ONC ECOG PS:[phone]}  There were no vitals filed for this visit. There were no vitals filed for this visit.  GENERAL:alert, no distress and comfortable SKIN: skin color, texture, turgor are normal, no rashes or significant lesions EYES: normal, Conjunctiva are pink and non-injected, sclera clear OROPHARYNX:no exudate, no erythema and lips, buccal mucosa, and tongue normal  NECK: supple, thyroid normal size, non-tender, without nodularity LYMPH:  no palpable lymphadenopathy in the cervical, axillary or inguinal LUNGS: clear to auscultation and percussion with normal breathing  effort HEART: regular rate  & rhythm and no murmurs and no lower extremity edema ABDOMEN:abdomen soft, non-tender and normal bowel sounds Musculoskeletal:no cyanosis of digits and no clubbing  NEURO: alert & oriented x 3 with fluent speech, no focal motor/sensory deficits  LABORATORY DATA:  I have reviewed the data as listed CBC Latest Ref Rng & Units 12/02/2018 05/26/2018 05/09/2018  WBC 4.0 - 10.5 K/uL 7.9 5.4 5.7  Hemoglobin 13.0 - 17.0 g/dL 12.2(L) 13.4 13.5  Hematocrit 39.0 - 52.0 % 38.8(L) 42.9 43.6  Platelets 150 - 400 K/uL 231 184 208     CMP Latest Ref Rng & Units 12/02/2018 05/26/2018 05/09/2018  Glucose 70 - 99 mg/dL 51(L) 78 84  BUN 8 - 23 mg/dL 15 10 11   Creatinine 0.61 - 1.24 mg/dL 1.04 1.01 1.06  Sodium 135 - 145 mmol/L 142 143 143  Potassium 3.5 - 5.1 mmol/L 4.1 4.2 4.1  Chloride 98 - 111 mmol/L 108 107 107  CO2 22 - 32 mmol/L 28 29 28   Calcium 8.9 - 10.3 mg/dL 9.4 9.3 9.5  Total Protein 6.5 - 8.1 g/dL 6.4(L) 6.8 6.4(L)  Total Bilirubin 0.3 - 1.2 mg/dL 0.5 0.8 0.8  Alkaline Phos 38 - 126 U/L 101 120 101  AST 15 - 41 U/L 29 19 25   ALT 0 - 44 U/L 38 15 30      RADIOGRAPHIC STUDIES: I have personally reviewed the radiological images as listed and agreed with the findings in the report. No results found.   ASSESSMENT & PLAN:  No problem-specific Assessment & Plan notes found for this encounter.   No orders of the defined types were placed in this encounter.  All questions were answered. The patient knows to call the clinic with any problems, questions or concerns. No barriers to learning was detected. I spent {CHL ONC TIME VISIT - [id number] counseling the patient face to face. The total time spent in the appointment was {CHL ONC TIME VISIT - [id number] and more than 50% was on counseling and review of test results     Alla Feeling, NP 03/12/19

## 2019-03-13 ENCOUNTER — Ambulatory Visit: Payer: Medicare HMO | Admitting: Nurse Practitioner

## 2019-03-13 ENCOUNTER — Other Ambulatory Visit: Payer: Medicare HMO

## 2019-03-13 ENCOUNTER — Telehealth: Payer: Self-pay | Admitting: Nurse Practitioner

## 2019-03-13 NOTE — Telephone Encounter (Signed)
I tried to reach patient to check on him and inquire about missed f/u today. No answer, voicemail not accepting messages. Will try to reach him again at later date.  Cira Rue, NP  03/13/19

## 2019-03-23 ENCOUNTER — Telehealth: Payer: Self-pay | Admitting: Nurse Practitioner

## 2019-03-23 NOTE — Telephone Encounter (Signed)
Scheduled appt per 11/12 sch message - pt is aware of appt date and time.  

## 2019-03-30 ENCOUNTER — Encounter: Payer: Self-pay | Admitting: Nurse Practitioner

## 2019-03-30 ENCOUNTER — Inpatient Hospital Stay: Payer: Medicare HMO | Attending: Hematology

## 2019-03-30 ENCOUNTER — Inpatient Hospital Stay (HOSPITAL_BASED_OUTPATIENT_CLINIC_OR_DEPARTMENT_OTHER): Payer: Medicare HMO | Admitting: Nurse Practitioner

## 2019-03-30 ENCOUNTER — Other Ambulatory Visit: Payer: Self-pay

## 2019-03-30 VITALS — BP 131/69 | HR 62 | Temp 98.7°F | Resp 16 | Ht 70.0 in | Wt 136.9 lb

## 2019-03-30 DIAGNOSIS — D649 Anemia, unspecified: Secondary | ICD-10-CM

## 2019-03-30 DIAGNOSIS — N400 Enlarged prostate without lower urinary tract symptoms: Secondary | ICD-10-CM | POA: Diagnosis not present

## 2019-03-30 DIAGNOSIS — Z23 Encounter for immunization: Secondary | ICD-10-CM | POA: Diagnosis not present

## 2019-03-30 DIAGNOSIS — C162 Malignant neoplasm of body of stomach: Secondary | ICD-10-CM

## 2019-03-30 DIAGNOSIS — D509 Iron deficiency anemia, unspecified: Secondary | ICD-10-CM | POA: Diagnosis not present

## 2019-03-30 DIAGNOSIS — Z85028 Personal history of other malignant neoplasm of stomach: Secondary | ICD-10-CM | POA: Insufficient documentation

## 2019-03-30 DIAGNOSIS — Z79899 Other long term (current) drug therapy: Secondary | ICD-10-CM | POA: Insufficient documentation

## 2019-03-30 DIAGNOSIS — Z9119 Patient's noncompliance with other medical treatment and regimen: Secondary | ICD-10-CM | POA: Insufficient documentation

## 2019-03-30 DIAGNOSIS — F17210 Nicotine dependence, cigarettes, uncomplicated: Secondary | ICD-10-CM | POA: Diagnosis not present

## 2019-03-30 DIAGNOSIS — Z9221 Personal history of antineoplastic chemotherapy: Secondary | ICD-10-CM | POA: Insufficient documentation

## 2019-03-30 LAB — CBC WITH DIFFERENTIAL (CANCER CENTER ONLY)
Abs Immature Granulocytes: 0.03 10*3/uL (ref 0.00–0.07)
Basophils Absolute: 0.1 10*3/uL (ref 0.0–0.1)
Basophils Relative: 1 %
Eosinophils Absolute: 0.1 10*3/uL (ref 0.0–0.5)
Eosinophils Relative: 1 %
HCT: 33.7 % — ABNORMAL LOW (ref 39.0–52.0)
Hemoglobin: 10.2 g/dL — ABNORMAL LOW (ref 13.0–17.0)
Immature Granulocytes: 0 %
Lymphocytes Relative: 20 %
Lymphs Abs: 1.8 10*3/uL (ref 0.7–4.0)
MCH: 27.4 pg (ref 26.0–34.0)
MCHC: 30.3 g/dL (ref 30.0–36.0)
MCV: 90.6 fL (ref 80.0–100.0)
Monocytes Absolute: 0.5 10*3/uL (ref 0.1–1.0)
Monocytes Relative: 6 %
Neutro Abs: 6.7 10*3/uL (ref 1.7–7.7)
Neutrophils Relative %: 72 %
Platelet Count: 305 10*3/uL (ref 150–400)
RBC: 3.72 MIL/uL — ABNORMAL LOW (ref 4.22–5.81)
RDW: 13 % (ref 11.5–15.5)
WBC Count: 9.2 10*3/uL (ref 4.0–10.5)
nRBC: 0 % (ref 0.0–0.2)

## 2019-03-30 LAB — CMP (CANCER CENTER ONLY)
ALT: 18 U/L (ref 0–44)
AST: 20 U/L (ref 15–41)
Albumin: 3.6 g/dL (ref 3.5–5.0)
Alkaline Phosphatase: 103 U/L (ref 38–126)
Anion gap: 7 (ref 5–15)
BUN: 16 mg/dL (ref 8–23)
CO2: 28 mmol/L (ref 22–32)
Calcium: 9.4 mg/dL (ref 8.9–10.3)
Chloride: 107 mmol/L (ref 98–111)
Creatinine: 1.11 mg/dL (ref 0.61–1.24)
GFR, Est AFR Am: >60 mL/min (ref >60)
GFR, Estimated: >60 mL/min (ref >60)
Glucose, Bld: 83 mg/dL (ref 70–99)
Potassium: 4.2 mmol/L (ref 3.5–5.1)
Sodium: 142 mmol/L (ref 135–145)
Total Bilirubin: 0.4 mg/dL (ref 0.3–1.2)
Total Protein: 6.3 g/dL — ABNORMAL LOW (ref 6.5–8.1)

## 2019-03-30 LAB — IRON AND TIBC
Iron: 21 ug/dL — ABNORMAL LOW (ref 42–163)
Saturation Ratios: 6 % — ABNORMAL LOW (ref 20–55)
TIBC: 325 ug/dL (ref 202–409)
UIBC: 304 ug/dL (ref 117–376)

## 2019-03-30 LAB — FERRITIN: Ferritin: 12 ng/mL — ABNORMAL LOW (ref 24–336)

## 2019-03-30 MED ORDER — INFLUENZA VAC A&B SA ADJ QUAD 0.5 ML IM PRSY
0.5000 mL | PREFILLED_SYRINGE | Freq: Once | INTRAMUSCULAR | Status: AC
  Administered 2019-03-30: 14:00:00 0.5 mL via INTRAMUSCULAR

## 2019-03-30 MED ORDER — INFLUENZA VAC A&B SA ADJ QUAD 0.5 ML IM PRSY
PREFILLED_SYRINGE | INTRAMUSCULAR | Status: AC
  Filled 2019-03-30: qty 0.5

## 2019-03-30 NOTE — Progress Notes (Signed)
Gabriel Poole   [phone] [phone]   Clinic Follow up Note   Patient Care Team: Scotty Court, DO as PCP - General Truitt Merle, MD as Consulting Physician (Hematology) Alla Feeling, NP as Nurse Practitioner (Nurse Practitioner) Stark Klein, MD as Consulting Physician (General Surgery) Rogene Houston, MD as Consulting Physician (Gastroenterology) Milus Banister, MD as Attending Physician (Gastroenterology) 03/30/2019  CHIEF COMPLAINT: F/u gastric cancer, weight loss   SUMMARY OF ONCOLOGIC HISTORY: Oncology History Overview Note  Cancer Staging Malignant neoplasm of body of stomach ([place]) Staging form: Stomach, AJCC 8th Edition - Clinical stage from 03/11/2017: Stage IIB (cT3, cN0, cM0) - Unsigned - Pathologic stage from 04/22/2017: Stage IIIB (pT4a, pN3a, cM0) - Signed by Alla Feeling, NP on 05/17/2017     Malignant neoplasm of body of stomach ([place])  02/17/2017 Initial Diagnosis   Malignant neoplasm of body of stomach ([place])   02/17/2017 Pathology Results   Diagnosis Stomach, biopsy, gastric ulcer - ADENOCARCINOMA. Microscopic Comment Several of the fragments are involved by moderately differentiated adenocarcinoma.   02/17/2017 Procedure   UPPER ENDOSCOPY  FINDINGS - Normal esophagus. - Z-line irregular, 44 cm from the incisors. - Red blood in the gastric body and in the gastric antrum. - Large gastric ulcer. Biopsied. - Erythematous mucosa in the antrum. - Normal cardia, gastric fundus, gastric body and pylorus. - Normal duodenal bulb and second portion of the duodenum. Comment: Endoscopic appearence concerning for malignant ulcer.   03/11/2017 Procedure   UPPER EUS PER DR. Ardis Hughs Findings: 1. The esophagus was normal. 2. There was a 2-3cm ulcerated, malignant mass along the greater curvature of the stomach, approximately mid-body. The mass was non-circumferential. 3. The duodenum was normal. Endosonographic  Finding 1. The gastric mass above correlated with a hypoechoic and heterogenous non-circumferential mass that measured 2.9cm across, 52m deep. The endosonographic borders were poorly defined and there was clear sonographic evidence suggesting invasion into and through the muscularis propria layer without evident invasion into nearby organs (uT3). 2. The duodenal lymphnode described on recent CT scan appeared reactive by [place]. 3. No perigastric adenopathy (uN0) 4. Limited views of the liver, spleen, pancreas, bile duct, gallbladder were all normal. - Along the greater curvature of the stomach, approximately mid-body, there is a 2.9cm uT3N0 (clinical stage IIB) gastric adenocarcinoma.   04/06/2017 Imaging   CT CHEST IMPRESSION: 1. No acute cardiopulmonary abnormalities. 2. Two small pulmonary nodules are noted measuring up to 5 mm. Nonspecific but warrant attention on follow-up imaging follow up 3. Nonspecific hyperdense, possibly enhancing lesion along the dome of liver is identified. In a patient that is at increased risk for more definitive assessment of this structure with contrast enhanced MRI of the liver is advised.   04/21/2017 Imaging   MR ABDOMEN IMPRESSION: 1. Enhancing 2.9 cm mass along the lesser curvature in the gastric antrum, compatible with known gastric malignancy . 2. Mildly enlarged gastrohepatic ligament lymph node, cannot exclude nodal metastasis. 3. No definite liver metastatic disease. Hyperenhancing 0.9 cm liver dome mass remains inconclusive, although the MRI features are most suggestive of a flash filling hemangioma, and the mass has been stable for nearly 2 months. Additional subcentimeter focus of hyperenhancement in the lateral segment left liver lobe is most likely a benign transient vascular phenomenon. Recommend attention to these lesions on a follow-up MRI abdomen without and with IV contrast in 3-6 months. This recommendation follows ACR  consensus guidelines: Management of Incidental Liver Lesions on CT:  A White Paper of the ACR Incidental Findings Committee. J Am Coll Radiol 2017; 44:0102-7253. 4. Benign right adrenal adenoma.     04/22/2017 Pathology Results   Diagnosis 1. Liver, biopsy - BILE DUCT HAMARTOMA. - THERE IS NO EVIDENCE OF MALIGNANCY. 2. Lymph nodes, regional resection, portal - METASTATIC CARCINOMA IN 2 OF 6 LYMPH NODES (2/6). 3. Stomach, resection for tumor, distal - INVASIVE ADENOCARCINOMA, POORLY DIFFERENTIATED, SPANNING 3.8 CM. - PERINEURAL INVASION IS IDENTIFIED. - ADENOCARCINOMA INVOLVES THE SEROSA. - METASTATIC CARCINOMA IN 12 OF 30 LYMPH NODES (12/30), WITH EXTRACAPSULAR EXTENSION. - SEE ONCOLOGY TABLE BELOW. 4. Lymph node, biopsy, common hepatic artery - THERE IS NO EVIDENCE OF CARCINOMA IN 1 OF 1 LYMPH NODE (0/1). Microscopic Comment 3. STOMACH: Specimen: Stomach. Procedure: Partial gastrectomy. Tumor Site: Greater curvature. Tumor Size: 3.8 cm Histologic Type: Adenocarcinoma. Histologic Grade: G3: poorly differentiated. Microscopic Extent of Tumor: Adenocarcinoma involves the serosa. Margins (select all that apply): Adenoca Proximal Margin: Negative for adenocarcinoma. Distal Margin: Negative for adenocarcinoma. Treatment Effect: N/A Lymph-Vascular Invasion: Not identified. Perineural Invasion: Present. Additional findings: Chronic gastritis. Ancillary testing: Can be performed upon clinician request. 1 of 3 Duplicate copy FINAL for Gabriel Poole, Gabriel Poole ([id number]) Microscopic Comment(continued) Lymph nodes: number examined - 37; number positive: 14 Pathologic Staging: pT4a, pN3a (JBK:gt, 04/27/17)   06/02/2017 - 09/30/2017 Adjuvant Chemotherapy   FOLFOX every 2 weeks, oxaliplatin stopped in March 2019 due to neuropathy, chemo stopped on 09/30/2017 per pt's request    10/29/2017 Imaging   10/29/2017 CT CAP  IMPRESSION: 1. Status post distal gastrectomy. No evidence for  metastatic disease in the chest, abdomen, or pelvis. 2. Stable tiny right pulmonary nodules. Continued attention on follow-up recommended. 3. 9 mm hypervascular lesion in the dome of the liver is stable over multiple studies back to 04/05/2017. Continued attention on follow-up suggested. 4. Ascending thoracic aorta measures 4.2 cm diameter. Recommend annual imaging followup by CTA or MRA. This recommendation follows 2010 ACCF/AHA/AATS/ACR/ASA/SCA/SCAI/SIR/STS/SVM Guidelines for the Diagnosis and Management of Patients with Thoracic Aortic Disease. Circulation. 2010; 121: Q595-G387 . 5. Marked prostatomegaly. 6.  Aortic Atherosclerois (ICD10-170.0)   05/09/2018 Imaging   05/09/2018 CT CAP IMPRESSION: 1. No definite findings to suggest metastatic disease to the chest, abdomen or pelvis. 2. Multiple small pulmonary nodules scattered throughout the lungs bilaterally measuring 5 mm or less in size, stable compared to prior studies from 2018, favored to be benign. Continued attention on future follow-up examinations is recommended. 3. Aortic atherosclerosis with ectasia of the ascending thoracic aorta (4 cm in diameter). Recommend annual imaging followup by CTA or MRA. This recommendation follows 2010 ACCF/AHA/AATS/ACR/ASA/SCA/SCAI/SIR/STS/SVM Guidelines for the Diagnosis and Management of Patients with Thoracic Aortic Disease. Circulation. 2010; 121: F643-P295. 4. Multiple small calculi lying dependently in the right-side of the urinary bladder. 5. Severe prostatomegaly. 6. Additional incidental findings, as above.     CURRENT THERAPY:  Surveillance  INTERVAL HISTORY: Mr. Hilleary returns for f/u as scheduled. He was last seen in 11/2018. His significant other called to report weight loss back on 02/09/19, patient canceled f/u on 10/7 and no showed on 10/15 and 11/2. Today he presents alone. He has lost 10 lbs since last f/u, mostly in the last month wit associated bowel change. He was  having normal BMs few months ago, but over the last month he has BM q2-3 days with laxative. Denies black or bloody stool. Denies n/v or new abdominal pain. He feels full quickly. Does not tolerate boost/ensure. He drinks a milkshake every day at  work. His energy level fluctuates. Feels dizzy at times. Chronic cough is unchanged. Denies fever, chills, chest pain, dyspnea, or leg swelling.    MEDICAL HISTORY:  Past Medical History:  Diagnosis Date  . Arthritis   . Cancer [facility]) dx oct 02-2017   stomach  . Gout   . Heart murmur   . Hyperlipidemia   . Hypertension   . Stomach cancer [facility])     SURGICAL HISTORY: Past Surgical History:  Procedure Laterality Date  . COLONOSCOPY    . ESOPHAGOGASTRODUODENOSCOPY N/A 02/17/2017   Procedure: ESOPHAGOGASTRODUODENOSCOPY (EGD);  Surgeon: Rogene Houston, MD;  [facility];  Service: Endoscopy;  Laterality: N/A;  3:00  . EUS N/A 03/11/2017   Procedure: UPPER ENDOSCOPIC ULTRASOUND (EUS) LINEAR;  Surgeon: Milus Banister, MD;  Location: [facility];  Service: Endoscopy;  Laterality: N/A;  . EUS N/A 03/11/2017   Procedure: UPPER ENDOSCOPIC ULTRASOUND (EUS) RADIAL;  Surgeon: Milus Banister, MD;  Location: [facility];  Service: Endoscopy;  Laterality: N/A;  . FINGER SURGERY     Lt middle   . GASTRECTOMY N/A 04/22/2017   Procedure: PARTIAL GASTRECTOMY;  Surgeon: Stark Klein, MD;  Location: [place];  Service: General;  Laterality: N/A;  . GASTROJEJUNOSTOMY N/A 04/22/2017   Procedure: JEJUNAL FEEDING TUBE PLACEMENT;  Surgeon: Stark Klein, MD;  Location: [place];  Service: General;  Laterality: N/A;  . HYDROCELE EXCISION  02/12/2011   Procedure: HYDROCELECTOMY ADULT;  Surgeon: Marissa Nestle;  Location: [facility];  Service: Urology;  Laterality: Left;  . LAPAROSCOPY N/A 04/22/2017   Procedure: LAPAROSCOPY DIAGNOSTIC ERAS PATHWAY;  Surgeon: Stark Klein, MD;  Location: [place];  Service: General;  Laterality: N/A;  EPIDURAL  . PORTACATH  PLACEMENT N/A 05/28/2017   Procedure: INSERTION PORT-A-CATH ERAS PATHWAY;  Surgeon: Stark Klein, MD;  Location: [place];  Service: General;  Laterality: N/A;    I have reviewed the social history and family history with the patient and they are unchanged from previous note.  ALLERGIES:  is allergic to penicillins.  MEDICATIONS:  Current Outpatient Medications  Medication Sig Dispense Refill  . amLODipine (NORVASC) 10 MG tablet Take 1 tablet (10 mg total) by mouth daily. 90 tablet 0  . aspirin EC 81 MG tablet Take 81 mg by mouth daily.    Marland Kitchen atorvastatin (LIPITOR) 40 MG tablet Take 1 tablet (40 mg total) by mouth daily at 6 PM. 90 tablet 0  . Cholecalciferol (VITAMIN D3) 5000 units CAPS Take 5,000 Units by mouth daily.    . metoprolol tartrate (LOPRESSOR) 50 MG tablet Take 50 mg by mouth 2 (two) times daily.    . potassium chloride SA (K-DUR,KLOR-CON) 20 MEQ tablet Take 1 tablet (20 mEq total) by mouth 2 (two) times daily. (Patient taking differently: Take 20 mEq by mouth daily. ) 40 tablet 1  . triamterene-hydrochlorothiazide (MAXZIDE-25) 37.5-25 MG tablet Take 1 tablet by mouth daily.     No current facility-administered medications for this visit.     PHYSICAL EXAMINATION: ECOG PERFORMANCE STATUS: 1 - Symptomatic but completely ambulatory  Vitals:   03/30/19 1315  BP: 131/69  Pulse: 62  Resp: 16  Temp: 98.7 F (37.1 C)  SpO2: 100%   Filed Weights   03/30/19 1315  Weight: 136 lb 14.4 oz (62.1 kg)    GENERAL:alert, no distress and comfortable SKIN: no rash  EYES: sclera clear NECK: without mass LYMPH:  no palpable cervical, supraclavicular, or axillary lymphadenopathy LUNGS: clear with normal breathing  effort HEART: regular rate & rhythm, no lower extremity edema ABDOMEN: abdomen soft, non-tender and normal bowel sounds NEURO: alert & oriented x 3 with fluent speech, no focal motor/sensory deficits  LABORATORY DATA:  I have reviewed the data as  listed CBC Latest Ref Rng & Units 03/30/2019 12/02/2018 05/26/2018  WBC 4.0 - 10.5 K/uL 9.2 7.9 5.4  Hemoglobin 13.0 - 17.0 g/dL 10.2(L) 12.2(L) 13.4  Hematocrit 39.0 - 52.0 % 33.7(L) 38.8(L) 42.9  Platelets 150 - 400 K/uL 305 231 184     CMP Latest Ref Rng & Units 03/30/2019 12/02/2018 05/26/2018  Glucose 70 - 99 mg/dL 83 51(L) 78  BUN 8 - 23 mg/dL _0 Creatinine 0.61 - 1.24 mg/dL 1.11 1.04 1.01  Sodium 135 - 145 mmol/L 142 142 143  Potassium 3.5 - 5.1 mmol/L 4.2 4.1 4.2  Chloride 98 - 111 mmol/L 107 108 107  CO2 22 - 32 mmol/L _1 Calcium 8.9 - 10.3 mg/dL 9.4 9.4 9.3  Total Protein 6.5 - 8.1 g/dL 6.3(L) 6.4(L) 6.8  Total Bilirubin 0.3 - 1.2 mg/dL 0.4 0.5 0.8  Alkaline Phos 38 - 126 U/L 103 101 120  AST 15 - 41 U/L _2 ALT 0 - 44 U/L 18 38 15      RADIOGRAPHIC STUDIES: I have personally reviewed the radiological images as listed and agreed with the findings in the report. No results found.   ASSESSMENT & PLAN: RUFFIN LADA is a 73 y.o. male with   1. Primary adenocarcinoma of the pyloric antrum, pT4a, PN3aM0, stage IIIB, Grade3, MSI-stable -Diagnosed in 02/2017.S/psurgical resection and adjuvant chemotherapyFOLFOX. Patient was not compliant with chemo, oxaliplatin stopped after 4 cycles, and 5-FU stopped after 8 cycles treatment per his request. -His last colonoscopy was in 2015. Does not appear he has had one yet this year. Followed by Dr. Paulita Fujita -He has been on surveillance, over the last month he developed 10 lbs weight loss and change in bowel habits. Labs today show worsening anemia and recurrent iron deficiency.  -given his high risk for recurrence due to initial locally advanced disease, his symptoms are highly concerning for cancer recurrence.  -I recommend STAT CT today, he has not eaten today. He has noncompliance issues and I fear he will not keep his appointments if he has to leave and come back for scan. Scan is pending approval.  -I  recommend to start oral iron BID -I encouraged him to increase milkshakes to gain weight, he does not tolerate supplements well.  -Plan to see him back after CT to review results.   2.Ascendingaortic aneurysm -Monitored on surveillance scans for his gastric cancer  3. Smokingcessation -He still smokes, I encouraged him to use nicotine replacement products such as gum or patch   4. BPH and elevated PSA -PriorCT scan findings showed severe prostatomegaly -not discussed today   PLAN: -STAT CT [facility], pending authorization -F/u after scan to review results  -Start oral iron BID -he received flu vaccine today  No problem-specific Assessment & Plan notes found for this encounter.   Orders Placed This Encounter  Procedures  . CT Abdomen Pelvis W Contrast    Standing Status:   Future    Standing Expiration Date:   03/29/2020    Order Specific Question:   ** REASON FOR EXAM (FREE TEXT)    Answer:   h/o gastric cancer, 1 month weight loss, bowel changes, worsening anemia. r/o recurrence    Order Specific  Question:   If indicated for the ordered procedure, I authorize the administration of contrast media per Radiology protocol    Answer:   Yes    Order Specific Question:   Preferred imaging location?    Answer:   [facility]    Order Specific Question:   Is Oral Contrast requested for this exam?    Answer:   Yes, Per Radiology protocol    Order Specific Question:   Radiology Contrast Protocol - do NOT remove file path    Answer:   [email or web address]   All questions were answered. The patient knows to call the clinic with any problems, questions or concerns. No barriers to learning was detected. I spent 20 minutes counseling the patient face to face. The total time spent in the appointment was 25 minutes and more than 50% was on counseling and review of test results     Alla Feeling, NP 03/30/19

## 2019-03-31 ENCOUNTER — Telehealth: Payer: Self-pay | Admitting: Nurse Practitioner

## 2019-03-31 ENCOUNTER — Telehealth: Payer: Self-pay | Admitting: Hematology

## 2019-03-31 ENCOUNTER — Telehealth: Payer: Self-pay | Admitting: *Deleted

## 2019-03-31 NOTE — Telephone Encounter (Signed)
-----   Message from Alla Feeling, NP sent at 03/30/2019  5:07 PM EST ----- [place],  When his CT is scheduled please call and let him know asap. Please let him know his iron is low and to start oral iron BID. Watch for constipation.  Please send schedule message to see me or Dr. Burr Medico after CT Thanks, Regan Rakers

## 2019-03-31 NOTE — Telephone Encounter (Signed)
Per Cira Rue, NP, notified pt wife Maikel Greiman, made aware pt iron is low and to start oral iron BID and to watch for constipation. Also made aware of appt time and date Tuesday 04/04/19 @ 1030 am. Advised pt is to NPO 4hr prior. Wife will pick up contrast today. Wife verbalized understanding/

## 2019-03-31 NOTE — Telephone Encounter (Signed)
No los per 11/19 °

## 2019-03-31 NOTE — Telephone Encounter (Signed)
Scheduled appt per 11/20 sch message - unable to reach pt at his number . Called other - Deaunte Rubinstein and left message on her phone with appt date and time

## 2019-04-04 ENCOUNTER — Ambulatory Visit (HOSPITAL_COMMUNITY)
Admission: RE | Admit: 2019-04-04 | Discharge: 2019-04-04 | Disposition: A | Discharge status: Home / Self Care | Payer: Medicare HMO | Source: Ambulatory Visit | Attending: Nurse Practitioner | Admitting: Nurse Practitioner

## 2019-04-04 ENCOUNTER — Other Ambulatory Visit: Payer: Self-pay

## 2019-04-04 DIAGNOSIS — C162 Malignant neoplasm of body of stomach: Secondary | ICD-10-CM | POA: Diagnosis not present

## 2019-04-04 MED ORDER — SODIUM CHLORIDE (PF) 0.9 % IJ SOLN
INTRAMUSCULAR | Status: AC
  Filled 2019-04-04: qty 50

## 2019-04-04 MED ORDER — IOHEXOL 300 MG/ML  SOLN
100.0000 mL | Freq: Once | INTRAMUSCULAR | Status: AC | PRN
  Administered 2019-04-04: 100 mL via INTRAVENOUS

## 2019-04-11 ENCOUNTER — Telehealth: Payer: Self-pay

## 2019-04-11 NOTE — Progress Notes (Signed)
[facility]   Telephone:([phone]) [phone] [phone]) [phone]   Clinic Follow up Note   Patient Care Team: Scotty Court, DO as PCP - General Truitt Merle, MD as Consulting Physician (Hematology) Alla Feeling, NP as Nurse Practitioner (Nurse Practitioner) Stark Klein, MD as Consulting Physician (General Surgery) Rogene Houston, MD as Consulting Physician (Gastroenterology) Milus Banister, MD as Attending Physician (Gastroenterology) 04/12/2019  CHIEF COMPLAINT: F/u, weight loss, iron deficiency anemia   SUMMARY OF ONCOLOGIC HISTORY: Oncology History Overview Note  Cancer Staging Malignant neoplasm of body of stomach ([place]) Staging form: Stomach, AJCC 8th Edition - Clinical stage from 03/11/2017: Stage IIB (cT3, cN0, cM0) - Unsigned - Pathologic stage from 04/22/2017: Stage IIIB (pT4a, pN3a, cM0) - Signed by Alla Feeling, NP on 05/17/2017     Malignant neoplasm of body of stomach ([facility])  02/17/2017 Initial Diagnosis   Malignant neoplasm of body of stomach ([place])   02/17/2017 Pathology Results   Diagnosis Stomach, biopsy, gastric ulcer - ADENOCARCINOMA. Microscopic Comment Several of the fragments are involved by moderately differentiated adenocarcinoma.   02/17/2017 Procedure   UPPER ENDOSCOPY  FINDINGS - Normal esophagus. - Z-line irregular, 44 cm from the incisors. - Red blood in the gastric body and in the gastric antrum. - Large gastric ulcer. Biopsied. - Erythematous mucosa in the antrum. - Normal cardia, gastric fundus, gastric body and pylorus. - Normal duodenal bulb and second portion of the duodenum. Comment: Endoscopic appearence concerning for malignant ulcer.   03/11/2017 Procedure   UPPER EUS PER DR. Ardis Hughs Findings: 1. The esophagus was normal. 2. There was a 2-3cm ulcerated, malignant mass along the greater curvature of the stomach, approximately mid-body. The mass was non-circumferential. 3. The duodenum was  normal. Endosonographic Finding 1. The gastric mass above correlated with a hypoechoic and heterogenous non-circumferential mass that measured 2.9cm across, 104m deep. The endosonographic borders were poorly defined and there was clear sonographic evidence suggesting invasion into and through the muscularis propria layer without evident invasion into nearby organs (uT3). 2. The duodenal lymphnode described on recent CT scan appeared reactive by [place]. 3. No perigastric adenopathy (uN0) 4. Limited views of the liver, spleen, pancreas, bile duct, gallbladder were all normal. - Along the greater curvature of the stomach, approximately mid-body, there is a 2.9cm uT3N0 (clinical stage IIB) gastric adenocarcinoma.   04/06/2017 Imaging   CT CHEST IMPRESSION: 1. No acute cardiopulmonary abnormalities. 2. Two small pulmonary nodules are noted measuring up to 5 mm. Nonspecific but warrant attention on follow-up imaging follow up 3. Nonspecific hyperdense, possibly enhancing lesion along the dome of liver is identified. In a patient that is at increased risk for more definitive assessment of this structure with contrast enhanced MRI of the liver is advised.   04/21/2017 Imaging   MR ABDOMEN IMPRESSION: 1. Enhancing 2.9 cm mass along the lesser curvature in the gastric antrum, compatible with known gastric malignancy . 2. Mildly enlarged gastrohepatic ligament lymph node, cannot exclude nodal metastasis. 3. No definite liver metastatic disease. Hyperenhancing 0.9 cm liver dome mass remains inconclusive, although the MRI features are most suggestive of a flash filling hemangioma, and the mass has been stable for nearly 2 months. Additional subcentimeter focus of hyperenhancement in the lateral segment left liver lobe is most likely a benign transient vascular phenomenon. Recommend attention to these lesions on a follow-up MRI abdomen without and with IV contrast in 3-6 months. This  recommendation follows ACR consensus guidelines: Management of Incidental Liver Lesions on  CT: A White Paper of the ACR Incidental Findings Committee. J Am Coll Radiol 2017; 61:4431-5400. 4. Benign right adrenal adenoma.     04/22/2017 Pathology Results   Diagnosis 1. Liver, biopsy - BILE DUCT HAMARTOMA. - THERE IS NO EVIDENCE OF MALIGNANCY. 2. Lymph nodes, regional resection, portal - METASTATIC CARCINOMA IN 2 OF 6 LYMPH NODES (2/6). 3. Stomach, resection for tumor, distal - INVASIVE ADENOCARCINOMA, POORLY DIFFERENTIATED, SPANNING 3.8 CM. - PERINEURAL INVASION IS IDENTIFIED. - ADENOCARCINOMA INVOLVES THE SEROSA. - METASTATIC CARCINOMA IN 12 OF 30 LYMPH NODES (12/30), WITH EXTRACAPSULAR EXTENSION. - SEE ONCOLOGY TABLE BELOW. 4. Lymph node, biopsy, common hepatic artery - THERE IS NO EVIDENCE OF CARCINOMA IN 1 OF 1 LYMPH NODE (0/1). Microscopic Comment 3. STOMACH: Specimen: Stomach. Procedure: Partial gastrectomy. Tumor Site: Greater curvature. Tumor Size: 3.8 cm Histologic Type: Adenocarcinoma. Histologic Grade: G3: poorly differentiated. Microscopic Extent of Tumor: Adenocarcinoma involves the serosa. Margins (select all that apply): Adenoca Proximal Margin: Negative for adenocarcinoma. Distal Margin: Negative for adenocarcinoma. Treatment Effect: N/A Lymph-Vascular Invasion: Not identified. Perineural Invasion: Present. Additional findings: Chronic gastritis. Ancillary testing: Can be performed upon clinician request. 1 of 3 Duplicate copy FINAL for ESTIBEN, MIZUNO ([id number]) Microscopic Comment(continued) Lymph nodes: number examined - 37; number positive: 14 Pathologic Staging: pT4a, pN3a (JBK:gt, 04/27/17)   06/02/2017 - 09/30/2017 Adjuvant Chemotherapy   FOLFOX every 2 weeks, oxaliplatin stopped in March 2019 due to neuropathy, chemo stopped on 09/30/2017 per pt's request    10/29/2017 Imaging   10/29/2017 CT CAP  IMPRESSION: 1. Status post distal  gastrectomy. No evidence for metastatic disease in the chest, abdomen, or pelvis. 2. Stable tiny right pulmonary nodules. Continued attention on follow-up recommended. 3. 9 mm hypervascular lesion in the dome of the liver is stable over multiple studies back to 04/05/2017. Continued attention on follow-up suggested. 4. Ascending thoracic aorta measures 4.2 cm diameter. Recommend annual imaging followup by CTA or MRA. This recommendation follows 2010 ACCF/AHA/AATS/ACR/ASA/SCA/SCAI/SIR/STS/SVM Guidelines for the Diagnosis and Management of Patients with Thoracic Aortic Disease. Circulation. 2010; 121: T267-T245 . 5. Marked prostatomegaly. 6.  Aortic Atherosclerois (ICD10-170.0)   05/09/2018 Imaging   05/09/2018 CT CAP IMPRESSION: 1. No definite findings to suggest metastatic disease to the chest, abdomen or pelvis. 2. Multiple small pulmonary nodules scattered throughout the lungs bilaterally measuring 5 mm or less in size, stable compared to prior studies from 2018, favored to be benign. Continued attention on future follow-up examinations is recommended. 3. Aortic atherosclerosis with ectasia of the ascending thoracic aorta (4 cm in diameter). Recommend annual imaging followup by CTA or MRA. This recommendation follows 2010 ACCF/AHA/AATS/ACR/ASA/SCA/SCAI/SIR/STS/SVM Guidelines for the Diagnosis and Management of Patients with Thoracic Aortic Disease. Circulation. 2010; 121: Y099-I338. 4. Multiple small calculi lying dependently in the right-side of the urinary bladder. 5. Severe prostatomegaly. 6. Additional incidental findings, as above.   04/04/2019 Imaging   CT AP IMPRESSION: 1. Redemonstrated postoperative findings of distal gastrectomy and gastrojejunostomy (series 2, image 18, series 4, image 27). No evidence of malignant recurrence or metastatic disease in the abdomen or pelvis. 2. Subcutaneous body fat is substantially diminished in comparison to prior examination,  in keeping with reported history of weight loss. 3. Gross prostatomegaly and urinary bladder wall thickening, likely due to chronic outlet obstruction. 4.  Aortic Atherosclerosis (ICD10-I70.0).     CURRENT THERAPY: Surveillance   INTERVAL HISTORY: Mr. Amon returns for f/u as scheduled. His wife [name] accompanies him today. He pulled a muscle in his back few nights ago. He  is doing well otherwise. His wife encouraged him to start oral iron once daily a week ago. Not having constipation anymore. He is starting to eat more. Denies n/v or abdominal pain. Denies GI bleeding or black stool. He is working. No other issues.    MEDICAL HISTORY:  Past Medical History:  Diagnosis Date  . Arthritis   . Cancer [facility]) dx oct 02-2017   stomach  . Gout   . Heart murmur   . Hyperlipidemia   . Hypertension   . Stomach cancer [facility])     SURGICAL HISTORY: Past Surgical History:  Procedure Laterality Date  . COLONOSCOPY    . ESOPHAGOGASTRODUODENOSCOPY N/A 02/17/2017   Procedure: ESOPHAGOGASTRODUODENOSCOPY (EGD);  Surgeon: Rogene Houston, MD;  [facility];  Service: Endoscopy;  Laterality: N/A;  3:00  . EUS N/A 03/11/2017   Procedure: UPPER ENDOSCOPIC ULTRASOUND (EUS) LINEAR;  Surgeon: Milus Banister, MD;  Location: [facility];  Service: Endoscopy;  Laterality: N/A;  . EUS N/A 03/11/2017   Procedure: UPPER ENDOSCOPIC ULTRASOUND (EUS) RADIAL;  Surgeon: Milus Banister, MD;  Location: [facility];  Service: Endoscopy;  Laterality: N/A;  . FINGER SURGERY     Lt middle   . GASTRECTOMY N/A 04/22/2017   Procedure: PARTIAL GASTRECTOMY;  Surgeon: Stark Klein, MD;  Location: [facility];  Service: General;  Laterality: N/A;  . GASTROJEJUNOSTOMY N/A 04/22/2017   Procedure: JEJUNAL FEEDING TUBE PLACEMENT;  Surgeon: Stark Klein, MD;  Location: [facility];  Service: General;  Laterality: N/A;  . HYDROCELE EXCISION  02/12/2011   Procedure: HYDROCELECTOMY ADULT;  Surgeon: Marissa Nestle;   Location: [facility];  Service: Urology;  Laterality: Left;  . LAPAROSCOPY N/A 04/22/2017   Procedure: LAPAROSCOPY DIAGNOSTIC ERAS PATHWAY;  Surgeon: Stark Klein, MD;  Location: [facility];  Service: General;  Laterality: N/A;  EPIDURAL  . PORTACATH PLACEMENT N/A 05/28/2017   Procedure: INSERTION PORT-A-CATH ERAS PATHWAY;  Surgeon: Stark Klein, MD;  Location: [facility];  Service: General;  Laterality: N/A;    I have reviewed the social history and family history with the patient and they are unchanged from previous note.  ALLERGIES:  is allergic to penicillins.  MEDICATIONS:  Current Outpatient Medications  Medication Sig Dispense Refill  . amLODipine (NORVASC) 10 MG tablet Take 1 tablet (10 mg total) by mouth daily. 90 tablet 0  . aspirin EC 81 MG tablet Take 81 mg by mouth daily.    Marland Kitchen atorvastatin (LIPITOR) 40 MG tablet Take 1 tablet (40 mg total) by mouth daily at 6 PM. 90 tablet 0  . Cholecalciferol (VITAMIN D3) 5000 units CAPS Take 5,000 Units by mouth daily.    . metoprolol tartrate (LOPRESSOR) 50 MG tablet Take 50 mg by mouth 2 (two) times daily.    . potassium chloride SA (K-DUR,KLOR-CON) 20 MEQ tablet Take 1 tablet (20 mEq total) by mouth 2 (two) times daily. (Patient taking differently: Take 20 mEq by mouth daily. ) 40 tablet 1  . triamterene-hydrochlorothiazide (MAXZIDE-25) 37.5-25 MG tablet Take 1 tablet by mouth daily.     No current facility-administered medications for this visit.     PHYSICAL EXAMINATION: ECOG PERFORMANCE STATUS: 1 - Symptomatic but completely ambulatory  Vitals:   04/12/19 1526  BP: 120/76  Pulse: 71  Resp: 17  Temp: 99.6 F (37.6 C)  SpO2: 100%   Filed Weights   04/12/19 1526  Weight: 137 lb 3.2 oz (62.2 kg)    GENERAL:alert, no distress and comfortable SKIN: no  rash  EYES: sclera clear LUNGS: clear, distant with normal breathing effort HEART: regular rate & rhythm, no lower extremity edema ABDOMEN:abdomen soft, non-tender  and normal bowel sounds Musculoskeletal: no focal tenderness  NEURO: alert & oriented x 3 with fluent speech, no focal motor/sensory deficits  LABORATORY DATA:  I have reviewed the data as listed CBC Latest Ref Rng & Units 03/30/2019 12/02/2018 05/26/2018  WBC 4.0 - 10.5 K/uL 9.2 7.9 5.4  Hemoglobin 13.0 - 17.0 g/dL 10.2(L) 12.2(L) 13.4  Hematocrit 39.0 - 52.0 % 33.7(L) 38.8(L) 42.9  Platelets 150 - 400 K/uL 305 231 184     CMP Latest Ref Rng & Units 03/30/2019 12/02/2018 05/26/2018  Glucose 70 - 99 mg/dL 83 51(L) 78  BUN 8 - 23 mg/dL _0 Creatinine 0.61 - 1.24 mg/dL 1.11 1.04 1.01  Sodium 135 - 145 mmol/L 142 142 143  Potassium 3.5 - 5.1 mmol/L 4.2 4.1 4.2  Chloride 98 - 111 mmol/L 107 108 107  CO2 22 - 32 mmol/L _1 Calcium 8.9 - 10.3 mg/dL 9.4 9.4 9.3  Total Protein 6.5 - 8.1 g/dL 6.3(L) 6.4(L) 6.8  Total Bilirubin 0.3 - 1.2 mg/dL 0.4 0.5 0.8  Alkaline Phos 38 - 126 U/L 103 101 120  AST 15 - 41 U/L _2 ALT 0 - 44 U/L 18 38 15      RADIOGRAPHIC STUDIES: I have personally reviewed the radiological images as listed and agreed with the findings in the report. No results found.   ASSESSMENT & PLAN: Gabriel Klahn Daltonis a 73 y.o.malewith   1. Primary adenocarcinoma of the pyloric antrum, pT4a, PN3aM0, stage IIIB, Grade3, MSI-stable -Diagnosed in 02/2017.S/psurgical resection and adjuvant chemotherapyFOLFOX. Patient was not compliant with chemo, oxaliplatin stopped after 4 cycles, and 5-FU stopped after 8 cycles treatment per his request. -His last colonoscopy was in 2015. Does not appear he has had one yet this year. Followed by Dr. Paulita Fujita -He has been on surveillance, in October developed 10 lbs weight loss and change in bowel habits. Labs on 11/19 showed recurrent iron deficiency anemia  -given his high risk for recurrence due to initial locally advanced disease, his symptoms are highly concerning for cancer recurrence.  -I obtained CT [facility] on 11/24  which showed redemonstrated postop findings from previous cancer resection, no evidence of recurrence or metastasis in abdomen or pelvis. I reviewed this with him and his wife today -will continue surveillance -I strongly encouraged him or [name] to call if he has new pain, bloating, continued weight loss, unexplained fatigue, or bowel changes.   -f/u in 3 months   2. Recurrent iron deficiency anemia  -he initially had low serum iron and low transferrin saturation in 06/2017 after cancer resection; iron and Ferritin were normal in the interval -he developed worsening anemia in 03/2019, Hg 10.2 on 11/19, ferritin 12, serum iron 21 and 6% transferrin saturation, consistent with iron deficiency  -cause is unclear, no evidence of GI bleeding -I recommend to refer him back to GI Dr. Paulita Fujita to discuss possible endoscopy for further evaluation. The patient agrees. I referred him today  -I recommend for him to continue oral iron 1 tab BID with orange juice for better absorption, for at least 3 months . - Will repeat labs later next month to see if he is responding. If not responding or tolerating well we discussed possible need for IV iron, he agrees.   3. Weight loss -likely related to prior surgery -I  referred him to our dietician for guidance  4.Ascendingaortic aneurysm -will monitor on next surveillance scan for his gastric cancer  5. Smokingcessation -Hestill smokes, I encouraged him to use nicotine replacement products such as gum or patch   6. BPH and elevated PSA -severe prostatomegalynoted on multiple scans -has urologist, not seen lately -denies hematuria or new issues   PLAN: -Labs, CT reviewed, no evidence of recurrence in abd/pelvis  -Continue cancer surveillance  -Oral iron 1 tab BID  -Repeat labs late next month to see if he is responding to oral iron. If not, or if low tolerance, will recommend IV Feraheme -Referral to Dr. Paulita Fujita for IDA -Referral to dietician for  weight loss -Lab, f/u in 3 months   No problem-specific Assessment & Plan notes found for this encounter.   Orders Placed This Encounter  Procedures  . CBC with Differential (Cancer Center Only)    Standing Status:   Future    Standing Expiration Date:   04/11/2020  . CMP ([facility] only)    Standing Status:   Future    Standing Expiration Date:   04/11/2020  . Iron and TIBC    Standing Status:   Future    Standing Expiration Date:   04/11/2020  . Ferritin    Standing Status:   Future    Standing Expiration Date:   04/11/2020  . Ambulatory referral to Gastroenterology    Referral Priority:   Routine    Referral Type:   Consultation    Referral Reason:   Specialty Services Required    Referred to Provider:   Arta Silence, MD    Number of Visits Requested:   1  . Ambulatory referral to Nutrition and Diabetic E    Referral Priority:   Routine    Referral Type:   Consultation    Referral Reason:   Specialty Services Required    Number of Visits Requested:   1   All questions were answered. The patient knows to call the clinic with any problems, questions or concerns. No barriers to learning was detected. I spent 20 minutes counseling the patient face to face. The total time spent in the appointment was 25 minutes and more than 50% was on counseling and review of test results     Alla Feeling, NP 04/12/19

## 2019-04-11 NOTE — Telephone Encounter (Signed)
Gabriel Poole, please remind him about our appointment tomorrow and offer him phone visit so his wife can be on the call. Let [name] know if he wants to change it to phone.   Thanks, Regan Rakers

## 2019-04-11 NOTE — Telephone Encounter (Signed)
Patient calls wanting results of his recent CT scan.  He states he is going to work and would like his wife [name] called at [phone]

## 2019-04-11 NOTE — Telephone Encounter (Signed)
Spoke with Leida Lauth to remind her that he has an appointment on 12/2 at 3:15, she states he probably won't come but she will talk to him and let me know.

## 2019-04-11 NOTE — Telephone Encounter (Signed)
He has visit with [facility] tomorrow.   Lacie, I have reviewed his scan and lab, he has developed IDA, needs repeated endoscopy, no CT evidence of metastasis.   Truitt Merle MD

## 2019-04-11 NOTE — Telephone Encounter (Signed)
Received a call back from Leida Lauth that the patient will keep his appointment tomorrow.

## 2019-04-12 ENCOUNTER — Inpatient Hospital Stay: Payer: Medicare HMO | Attending: Hematology | Admitting: Nurse Practitioner

## 2019-04-12 ENCOUNTER — Encounter: Payer: Self-pay | Admitting: Nurse Practitioner

## 2019-04-12 ENCOUNTER — Other Ambulatory Visit: Payer: Self-pay

## 2019-04-12 VITALS — BP 120/76 | HR 71 | Temp 99.6°F | Resp 17 | Ht 70.0 in | Wt 137.2 lb

## 2019-04-12 DIAGNOSIS — Z79899 Other long term (current) drug therapy: Secondary | ICD-10-CM | POA: Insufficient documentation

## 2019-04-12 DIAGNOSIS — Z9221 Personal history of antineoplastic chemotherapy: Secondary | ICD-10-CM | POA: Diagnosis not present

## 2019-04-12 DIAGNOSIS — N400 Enlarged prostate without lower urinary tract symptoms: Secondary | ICD-10-CM | POA: Insufficient documentation

## 2019-04-12 DIAGNOSIS — C162 Malignant neoplasm of body of stomach: Secondary | ICD-10-CM | POA: Diagnosis not present

## 2019-04-12 DIAGNOSIS — D509 Iron deficiency anemia, unspecified: Secondary | ICD-10-CM | POA: Insufficient documentation

## 2019-04-12 DIAGNOSIS — F17210 Nicotine dependence, cigarettes, uncomplicated: Secondary | ICD-10-CM | POA: Diagnosis not present

## 2019-04-12 DIAGNOSIS — Z9119 Patient's noncompliance with other medical treatment and regimen: Secondary | ICD-10-CM | POA: Insufficient documentation

## 2019-04-12 DIAGNOSIS — Z85028 Personal history of other malignant neoplasm of stomach: Secondary | ICD-10-CM | POA: Insufficient documentation

## 2019-04-13 ENCOUNTER — Telehealth: Payer: Self-pay | Admitting: Nurse Practitioner

## 2019-04-13 NOTE — Telephone Encounter (Signed)
Scheduled appt per 12/2 los. Left a VM of the appt date and time.

## 2019-05-19 ENCOUNTER — Telehealth: Payer: Self-pay

## 2019-05-19 NOTE — Telephone Encounter (Signed)
I attempted to contact Mr Gabriel Poole but there was no answer.  I spoke with Gabriel Poole and confirmed his phone number  And ask if she would tell him to call our office during business hours next week.  She agreed to contact Mr. Gabriel Poole.

## 2019-05-26 ENCOUNTER — Telehealth: Payer: Self-pay

## 2019-05-26 NOTE — Telephone Encounter (Signed)
Mr. Pullum returned our call from 05/19/2019.  I told him that he needed to call [facility] at [phone] to schedule an appointment.  He verbalized understanding and correctly read the number back to me.

## 2019-05-29 ENCOUNTER — Other Ambulatory Visit: Payer: Medicare HMO

## 2019-05-31 ENCOUNTER — Other Ambulatory Visit: Payer: Medicare Other

## 2019-06-05 ENCOUNTER — Ambulatory Visit: Payer: Medicare HMO | Admitting: Hematology

## 2019-07-05 DIAGNOSIS — R42 Dizziness and giddiness: Secondary | ICD-10-CM | POA: Diagnosis not present

## 2019-07-05 DIAGNOSIS — M5430 Sciatica, unspecified side: Secondary | ICD-10-CM | POA: Diagnosis not present

## 2019-07-05 DIAGNOSIS — Z13220 Encounter for screening for lipoid disorders: Secondary | ICD-10-CM | POA: Diagnosis not present

## 2019-07-05 DIAGNOSIS — I10 Essential (primary) hypertension: Secondary | ICD-10-CM | POA: Diagnosis not present

## 2019-07-05 DIAGNOSIS — D649 Anemia, unspecified: Secondary | ICD-10-CM | POA: Diagnosis not present

## 2019-07-12 ENCOUNTER — Other Ambulatory Visit: Payer: Medicare HMO

## 2019-07-12 ENCOUNTER — Telehealth: Payer: Self-pay | Admitting: Hematology

## 2019-07-12 ENCOUNTER — Ambulatory Visit: Payer: Medicare HMO | Admitting: Hematology

## 2019-07-12 NOTE — Telephone Encounter (Signed)
R/s appt per 3/3 sch message - pt aware of appt date and time

## 2019-07-18 ENCOUNTER — Telehealth: Payer: Self-pay

## 2019-07-18 NOTE — Telephone Encounter (Signed)
Pt called to schedule appt.  He was unaware of his appt on 3/11.  Those appts were rescheduled to 3/19. Pt verbalized understanding.

## 2019-07-20 ENCOUNTER — Ambulatory Visit: Payer: Self-pay | Admitting: Hematology

## 2019-07-20 ENCOUNTER — Other Ambulatory Visit: Payer: Medicare HMO

## 2019-07-20 NOTE — Progress Notes (Signed)
[place]   [phone] [phone]   Clinic Follow up Note   Patient Care Team: Scotty Court, DO as PCP - General Truitt Merle, MD as Consulting Physician (Hematology) Alla Feeling, NP as Nurse Practitioner (Nurse Practitioner) Stark Klein, MD as Consulting Physician (General Surgery) Rogene Houston, MD as Consulting Physician (Gastroenterology) Milus Banister, MD as Attending Physician (Gastroenterology)  Date of Service:  07/28/2019  CHIEF COMPLAINT: F/u on gastric cancer  SUMMARY OF ONCOLOGIC HISTORY: Oncology History Overview Note  Cancer Staging Malignant neoplasm of body of stomach ([place]) Staging form: Stomach, AJCC 8th Edition - Clinical stage from 03/11/2017: Stage IIB (cT3, cN0, cM0) - Unsigned - Pathologic stage from 04/22/2017: Stage IIIB (pT4a, pN3a, cM0) - Signed by Alla Feeling, NP on 05/17/2017     Malignant neoplasm of body of stomach ([place])  02/17/2017 Initial Diagnosis   Malignant neoplasm of body of stomach ([place])   02/17/2017 Pathology Results   Diagnosis Stomach, biopsy, gastric ulcer - ADENOCARCINOMA. Microscopic Comment Several of the fragments are involved by moderately differentiated adenocarcinoma.   02/17/2017 Procedure   UPPER ENDOSCOPY  FINDINGS - Normal esophagus. - Z-line irregular, 44 cm from the incisors. - Red blood in the gastric body and in the gastric antrum. - Large gastric ulcer. Biopsied. - Erythematous mucosa in the antrum. - Normal cardia, gastric fundus, gastric body and pylorus. - Normal duodenal bulb and second portion of the duodenum. Comment: Endoscopic appearence concerning for malignant ulcer.   03/11/2017 Procedure   UPPER EUS PER DR. Ardis Hughs Findings: 1. The esophagus was normal. 2. There was a 2-3cm ulcerated, malignant mass along the greater curvature of the stomach, approximately mid-body. The mass was non-circumferential. 3. The duodenum was  normal. Endosonographic Finding 1. The gastric mass above correlated with a hypoechoic and heterogenous non-circumferential mass that measured 2.9cm across, 21m deep. The endosonographic borders were poorly defined and there was clear sonographic evidence suggesting invasion into and through the muscularis propria layer without evident invasion into nearby organs (uT3). 2. The duodenal lymphnode described on recent CT scan appeared reactive by [place]. 3. No perigastric adenopathy (uN0) 4. Limited views of the liver, spleen, pancreas, bile duct, gallbladder were all normal. - Along the greater curvature of the stomach, approximately mid-body, there is a 2.9cm uT3N0 (clinical stage IIB) gastric adenocarcinoma.   04/06/2017 Imaging   CT CHEST IMPRESSION: 1. No acute cardiopulmonary abnormalities. 2. Two small pulmonary nodules are noted measuring up to 5 mm. Nonspecific but warrant attention on follow-up imaging follow up 3. Nonspecific hyperdense, possibly enhancing lesion along the dome of liver is identified. In a patient that is at increased risk for more definitive assessment of this structure with contrast enhanced MRI of the liver is advised.   04/21/2017 Imaging   MR ABDOMEN IMPRESSION: 1. Enhancing 2.9 cm mass along the lesser curvature in the gastric antrum, compatible with known gastric malignancy . 2. Mildly enlarged gastrohepatic ligament lymph node, cannot exclude nodal metastasis. 3. No definite liver metastatic disease. Hyperenhancing 0.9 cm liver dome mass remains inconclusive, although the MRI features are most suggestive of a flash filling hemangioma, and the mass has been stable for nearly 2 months. Additional subcentimeter focus of hyperenhancement in the lateral segment left liver lobe is most likely a benign transient vascular phenomenon. Recommend attention to these lesions on a follow-up MRI abdomen without and with IV contrast in 3-6 months. This  recommendation follows ACR consensus guidelines: Management of Incidental Liver  Lesions on CT: A White Paper of the ACR Incidental Findings Committee. J Am Coll Radiol 2017; 21:1941-7408. 4. Benign right adrenal adenoma.     04/22/2017 Pathology Results   Diagnosis 1. Liver, biopsy - BILE DUCT HAMARTOMA. - THERE IS NO EVIDENCE OF MALIGNANCY. 2. Lymph nodes, regional resection, portal - METASTATIC CARCINOMA IN 2 OF 6 LYMPH NODES (2/6). 3. Stomach, resection for tumor, distal - INVASIVE ADENOCARCINOMA, POORLY DIFFERENTIATED, SPANNING 3.8 CM. - PERINEURAL INVASION IS IDENTIFIED. - ADENOCARCINOMA INVOLVES THE SEROSA. - METASTATIC CARCINOMA IN 12 OF 30 LYMPH NODES (12/30), WITH EXTRACAPSULAR EXTENSION. - SEE ONCOLOGY TABLE BELOW. 4. Lymph node, biopsy, common hepatic artery - THERE IS NO EVIDENCE OF CARCINOMA IN 1 OF 1 LYMPH NODE (0/1). Microscopic Comment 3. STOMACH: Specimen: Stomach. Procedure: Partial gastrectomy. Tumor Site: Greater curvature. Tumor Size: 3.8 cm Histologic Type: Adenocarcinoma. Histologic Grade: G3: poorly differentiated. Microscopic Extent of Tumor: Adenocarcinoma involves the serosa. Margins (select all that apply): Adenoca Proximal Margin: Negative for adenocarcinoma. Distal Margin: Negative for adenocarcinoma. Treatment Effect: N/A Lymph-Vascular Invasion: Not identified. Perineural Invasion: Present. Additional findings: Chronic gastritis. Ancillary testing: Can be performed upon clinician request. 1 of 3 Duplicate copy FINAL for JUELZ, WHITTENBERG ([id number]) Microscopic Comment(continued) Lymph nodes: number examined - 37; number positive: 14 Pathologic Staging: pT4a, pN3a (JBK:gt, 04/27/17)   06/02/2017 - 09/30/2017 Adjuvant Chemotherapy   FOLFOX every 2 weeks, oxaliplatin stopped in March 2019 due to neuropathy, chemo stopped on 09/30/2017 per pt's request    10/29/2017 Imaging   10/29/2017 CT CAP  IMPRESSION: 1. Status post distal  gastrectomy. No evidence for metastatic disease in the chest, abdomen, or pelvis. 2. Stable tiny right pulmonary nodules. Continued attention on follow-up recommended. 3. 9 mm hypervascular lesion in the dome of the liver is stable over multiple studies back to 04/05/2017. Continued attention on follow-up suggested. 4. Ascending thoracic aorta measures 4.2 cm diameter. Recommend annual imaging followup by CTA or MRA. This recommendation follows 2010 ACCF/AHA/AATS/ACR/ASA/SCA/SCAI/SIR/STS/SVM Guidelines for the Diagnosis and Management of Patients with Thoracic Aortic Disease. Circulation. 2010; 121: J497-W263 . 5. Marked prostatomegaly. 6.  Aortic Atherosclerois (ICD10-170.0)   05/09/2018 Imaging   05/09/2018 CT CAP IMPRESSION: 1. No definite findings to suggest metastatic disease to the chest, abdomen or pelvis. 2. Multiple small pulmonary nodules scattered throughout the lungs bilaterally measuring 5 mm or less in size, stable compared to prior studies from 2018, favored to be benign. Continued attention on future follow-up examinations is recommended. 3. Aortic atherosclerosis with ectasia of the ascending thoracic aorta (4 cm in diameter). Recommend annual imaging followup by CTA or MRA. This recommendation follows 2010 ACCF/AHA/AATS/ACR/ASA/SCA/SCAI/SIR/STS/SVM Guidelines for the Diagnosis and Management of Patients with Thoracic Aortic Disease. Circulation. 2010; 121: Z858-I502. 4. Multiple small calculi lying dependently in the right-side of the urinary bladder. 5. Severe prostatomegaly. 6. Additional incidental findings, as above.   04/04/2019 Imaging   CT AP IMPRESSION: 1. Redemonstrated postoperative findings of distal gastrectomy and gastrojejunostomy (series 2, image 18, series 4, image 27). No evidence of malignant recurrence or metastatic disease in the abdomen or pelvis. 2. Subcutaneous body fat is substantially diminished in comparison to prior examination,  in keeping with reported history of weight loss. 3. Gross prostatomegaly and urinary bladder wall thickening, likely due to chronic outlet obstruction. 4.  Aortic Atherosclerosis (ICD10-I70.0).      CURRENT THERAPY:  Surveillance  INTERVAL HISTORY:  Gabriel Poole is here for a follow up of gastric cancer. He was last seen by me in 11/2018.  He was seen by NP Lacie in interim. He presents to the clinic alone. He notes he feels weak, nausea, no energy. He notes when he stand up he gets dizzy. He notes he has been feeling like this for 1 month. He notes dark brown stool, no black or bloody stool. He notes he will have a BM every 1-3 days. He denies abdominal pain except with oral iron pill which he was given by PCP on 07/20/19. He notes Dr. Melina Copa retired and is seeing new PCP in the same practice. He notes he can still work 7 hours a day at [employer]. He notes he will feel back muscle pulls often. He notes his appetite is fluctuating along with his weight.     REVIEW OF SYSTEMS:   Constitutional: Denies fevers, chills (+) Weight and appetite fluctuating (+) weakness, fatigue (+) Dizziness upon standing  Eyes: Denies blurriness of vision Ears, nose, mouth, throat, and face: Denies mucositis or sore throat Respiratory: Denies cough or wheezes (+) SOB Cardiovascular: Denies palpitation, chest discomfort or lower extremity swelling Gastrointestinal:  Denies heartburn or change in bowel habits (+) nausea  Skin: Denies abnormal skin rashes Lymphatics: Denies new lymphadenopathy or easy bruising Neurological:Denies numbness, tingling or new weaknesses Behavioral/Psych: Mood is stable, no new changes  All other systems were reviewed with the patient and are negative.  MEDICAL HISTORY:  Past Medical History:  Diagnosis Date  . Arthritis   . Cancer [facility]) dx oct 02-2017   stomach  . Gout   . Heart murmur   . Hyperlipidemia   . Hypertension   . Stomach cancer [facility])     SURGICAL  HISTORY: Past Surgical History:  Procedure Laterality Date  . COLONOSCOPY    . ESOPHAGOGASTRODUODENOSCOPY N/A 02/17/2017   Procedure: ESOPHAGOGASTRODUODENOSCOPY (EGD);  Surgeon: Rogene Houston, MD;  [facility];  Service: Endoscopy;  Laterality: N/A;  3:00  . EUS N/A 03/11/2017   Procedure: UPPER ENDOSCOPIC ULTRASOUND (EUS) LINEAR;  Surgeon: Milus Banister, MD;  Location: [facility];  Service: Endoscopy;  Laterality: N/A;  . EUS N/A 03/11/2017   Procedure: UPPER ENDOSCOPIC ULTRASOUND (EUS) RADIAL;  Surgeon: Milus Banister, MD;  Location: [facility];  Service: Endoscopy;  Laterality: N/A;  . FINGER SURGERY     Lt middle   . GASTRECTOMY N/A 04/22/2017   Procedure: PARTIAL GASTRECTOMY;  Surgeon: Stark Klein, MD;  Location: [place];  Service: General;  Laterality: N/A;  . GASTROJEJUNOSTOMY N/A 04/22/2017   Procedure: JEJUNAL FEEDING TUBE PLACEMENT;  Surgeon: Stark Klein, MD;  Location: [place];  Service: General;  Laterality: N/A;  . HYDROCELE EXCISION  02/12/2011   Procedure: HYDROCELECTOMY ADULT;  Surgeon: Marissa Nestle;  Location: [facility];  Service: Urology;  Laterality: Left;  . LAPAROSCOPY N/A 04/22/2017   Procedure: LAPAROSCOPY DIAGNOSTIC ERAS PATHWAY;  Surgeon: Stark Klein, MD;  Location: [place];  Service: General;  Laterality: N/A;  EPIDURAL  . PORTACATH PLACEMENT N/A 05/28/2017   Procedure: INSERTION PORT-A-CATH ERAS PATHWAY;  Surgeon: Stark Klein, MD;  Location: [facility];  Service: General;  Laterality: N/A;    I have reviewed the social history and family history with the patient and they are unchanged from previous note.  ALLERGIES:  is allergic to penicillins.  MEDICATIONS:  Current Outpatient Medications  Medication Sig Dispense Refill  . amLODipine (NORVASC) 10 MG tablet Take 1 tablet (10 mg total) by mouth daily. 90 tablet 0  . aspirin EC 81 MG tablet Take 81 mg  by mouth daily.    Marland Kitchen atorvastatin (LIPITOR) 40 MG tablet Take 1 tablet  (40 mg total) by mouth daily at 6 PM. 90 tablet 0  . Cholecalciferol (VITAMIN D3) 5000 units CAPS Take 5,000 Units by mouth daily.    . metoprolol tartrate (LOPRESSOR) 50 MG tablet Take 50 mg by mouth 2 (two) times daily.    . potassium chloride SA (K-DUR,KLOR-CON) 20 MEQ tablet Take 1 tablet (20 mEq total) by mouth 2 (two) times daily. (Patient taking differently: Take 20 mEq by mouth daily. ) 40 tablet 1  . triamterene-hydrochlorothiazide (MAXZIDE-25) 37.5-25 MG tablet Take 1 tablet by mouth daily.     No current facility-administered medications for this visit.    PHYSICAL EXAMINATION: ECOG PERFORMANCE STATUS: 1 - Symptomatic but completely ambulatory  Vitals:   07/28/19 0850  BP: 114/68  Pulse: 73  Resp: 18  Temp: 98.5 F (36.9 C)  SpO2: 100%   Filed Weights   07/28/19 0850  Weight: 137 lb 11.2 oz (62.5 kg)    GENERAL:alert, no distress and comfortable SKIN: skin color, texture, turgor are normal, no rashes or significant lesions EYES: normal, Conjunctiva are pink and non-injected, sclera clear  NECK: supple, thyroid normal size, non-tender, without nodularity LYMPH:  no palpable lymphadenopathy in the cervical, axillary  LUNGS: clear to auscultation and percussion with normal breathing effort HEART: regular rate & rhythm and no murmurs and no lower extremity edema ABDOMEN:abdomen soft, non-tender and normal bowel sounds (+) Epigastric tenderness  Musculoskeletal:no cyanosis of digits and no clubbing  NEURO: alert & oriented x 3 with fluent speech, no focal motor/sensory deficits  LABORATORY DATA:  I have reviewed the data as listed CBC Latest Ref Rng & Units 07/28/2019 03/30/2019 12/02/2018  WBC 4.0 - 10.5 K/uL 7.4 9.2 7.9  Hemoglobin 13.0 - 17.0 g/dL 6.7(LL) 10.2(L) 12.2(L)  Hematocrit 39.0 - 52.0 % 23.5(L) 33.7(L) 38.8(L)  Platelets 150 - 400 K/uL 395 305 231     CMP Latest Ref Rng & Units 07/28/2019 03/30/2019 12/02/2018  Glucose 70 - 99 mg/dL 114(H) 83 51(L)  BUN  8 - 23 mg/dL _0 Creatinine 0.61 - 1.24 mg/dL 1.18 1.11 1.04  Sodium 135 - 145 mmol/L 141 142 142  Potassium 3.5 - 5.1 mmol/L 4.1 4.2 4.1  Chloride 98 - 111 mmol/L 110 107 108  CO2 22 - 32 mmol/L _1 Calcium 8.9 - 10.3 mg/dL 8.8(L) 9.4 9.4  Total Protein 6.5 - 8.1 g/dL 5.8(L) 6.3(L) 6.4(L)  Total Bilirubin 0.3 - 1.2 mg/dL 0.2(L) 0.4 0.5  Alkaline Phos 38 - 126 U/L 96 103 101  AST 15 - 41 U/L _2 ALT 0 - 44 U/L 9 18 38      RADIOGRAPHIC STUDIES: I have personally reviewed the radiological images as listed and agreed with the findings in the report. No results found.   ASSESSMENT & PLAN:  KOLEMAN MARLING is a 74 y.o. male with    1. Primary adenocarcinoma of the pyloric antrum, pT4a, PN3aM0, stage IIIB, Grade3, MSI-stable -Diagnosed in 02/2017.S/psurgical resection and adjuvant chemotherapyFOLFOX. Patient was not compliant with chemo, oxaliplatin stopped after 4 cycles, and 5-FU stopped after 8 cycles treatment per his request. -current onsurveillance. We discussed is a high risk of recurrence due to he is locally advanced disease. -His last colonoscopy was in 2015.I will send message to Dr. Paulita Fujita for him to be seen later this year if he is due for colonoscopy. He is agreeable.  -  He missed his last surveillance scan because he did not have transportation at the time and lost follow up.  -He presents today with symptomatic anemia. He had poor tolerance to oral iron. He has been having fluctuating weight and appetite. He has epigastric tenderness on exam today. Labs reviewed, Hg 6.7, BG 114, Ca 8.8, protein 5.8, albumin 3.2. I recommend he proceed with Upper endoscopy and CT CAP in the next few weeks  -He still has PAC, I encouraged him to have flush at least every 2 months.  -F/u after scan    2. Recurrent iron deficiency anemia  -He initially had low serum iron and low transferrin saturation in 06/2017 after cancer resection.  -He developed worsening  anemia in 03/2019, Hg 10.2 on 11/19, ferritin 12, serum iron 21 and 6% transferrin saturation, consistent with iron deficiency. Cause is unclear, no evidence of GI bleeding.  -On 07/20/19 he was given oral iron pills by his new PCP. This caused stomach pain so he stopped.  -Labs today show Hg 6.7. Iron panel still pending but likely indicates iron deficiency. I recommend blood transfusion, he is agreeable.  -Given poor tolerance to oral iron will give IV Feraheme as needed. Will give this week if iron level low.  -For the last month he has been having nausea, SOB, dizziness with standing, and dark brown stool. His symptoms and labs are concerning for GI blood loss. I recommend he f/u with GI Dr. Paulita Fujita or [facility] to have Upper Endoscopy.    3. Weight loss  -likely related to prior surgery -He was previously referred to our dietician for guidance -He continues to have appetite and weight fluctuates.   4.Ascendingaortic aneurysm -Monitored on surveillance scans for his gastric cancer  5. Smokingcessation -He smokes less now, 1 cigarette a day occasionally.  -I advised him to completely stop smoking and offered joining a smoking cessation program, but states that he has transportation issue.  6. BPH and elevated PSA -He has nocturia and urinary frequency, history of BPH, and elevated PSA in July 2019 -He was seen by urologist before, will f/u  -PriorCT scan findings showed severe prostatomegaly  Plan -Flush, Type and Cross today  -Blood transfusion tomorrow, 2u -if iron low, will give and IV Feraheme next week  -CT CAP with Contrast in 2-3 weeks  -Send message to Dr. Paulita Fujita and [facility] for urgent upper endoscopy  -Per request I provided him a letter for his work absence.    No problem-specific Assessment & Plan notes found for this encounter.   Orders Placed This Encounter  Procedures  . Informed Consent Details: Physician/Practitioner Attestation; Transcribe to consent  form and obtain patient signature    Standing Status:   Future    Standing Expiration Date:   07/27/2020    Order Specific Question:   Physician/Practitioner attestation of informed consent for blood and or blood product transfusion    Answer:   I, the physician/practitioner, attest that I have discussed with the patient the benefits, risks, side effects, alternatives, likelihood of achieving goals and potential problems during recovery for the procedure that I have provided informed consent.    Order Specific Question:   Product(s)    Answer:   All Product(s)  . Care order/instruction    Transfuse Parameters    Standing Status:   Future    Standing Expiration Date:   07/27/2020  . Type and screen    Standing Status:   Future    Number of Occurrences:  1    Standing Expiration Date:   07/27/2020   All questions were answered. The patient knows to call the clinic with any problems, questions or concerns. No barriers to learning was detected. The total time spent in the appointment was 30 minutes.     Truitt Merle, MD 07/28/2019   I, Joslyn Devon, am acting as scribe for Truitt Merle, MD.   I have reviewed the above documentation for accuracy and completeness, and I agree with the above.

## 2019-07-28 ENCOUNTER — Telehealth: Payer: Self-pay | Admitting: Pain Medicine

## 2019-07-28 ENCOUNTER — Encounter: Payer: Self-pay | Admitting: Hematology

## 2019-07-28 ENCOUNTER — Inpatient Hospital Stay (HOSPITAL_BASED_OUTPATIENT_CLINIC_OR_DEPARTMENT_OTHER): Payer: Medicare Other | Admitting: Hematology

## 2019-07-28 ENCOUNTER — Other Ambulatory Visit: Payer: Self-pay

## 2019-07-28 ENCOUNTER — Inpatient Hospital Stay: Payer: Medicare Other | Attending: Hematology

## 2019-07-28 ENCOUNTER — Inpatient Hospital Stay: Payer: Medicare Other

## 2019-07-28 VITALS — BP 114/68 | HR 73 | Temp 98.5°F | Resp 18 | Ht 70.0 in | Wt 137.7 lb

## 2019-07-28 DIAGNOSIS — Z9221 Personal history of antineoplastic chemotherapy: Secondary | ICD-10-CM | POA: Diagnosis not present

## 2019-07-28 DIAGNOSIS — N400 Enlarged prostate without lower urinary tract symptoms: Secondary | ICD-10-CM | POA: Diagnosis not present

## 2019-07-28 DIAGNOSIS — F17210 Nicotine dependence, cigarettes, uncomplicated: Secondary | ICD-10-CM | POA: Insufficient documentation

## 2019-07-28 DIAGNOSIS — D630 Anemia in neoplastic disease: Secondary | ICD-10-CM

## 2019-07-28 DIAGNOSIS — R634 Abnormal weight loss: Secondary | ICD-10-CM | POA: Diagnosis not present

## 2019-07-28 DIAGNOSIS — I10 Essential (primary) hypertension: Secondary | ICD-10-CM

## 2019-07-28 DIAGNOSIS — C162 Malignant neoplasm of body of stomach: Secondary | ICD-10-CM

## 2019-07-28 DIAGNOSIS — D500 Iron deficiency anemia secondary to blood loss (chronic): Secondary | ICD-10-CM | POA: Diagnosis not present

## 2019-07-28 DIAGNOSIS — D509 Iron deficiency anemia, unspecified: Secondary | ICD-10-CM | POA: Insufficient documentation

## 2019-07-28 DIAGNOSIS — R972 Elevated prostate specific antigen [PSA]: Secondary | ICD-10-CM | POA: Diagnosis not present

## 2019-07-28 DIAGNOSIS — Z9119 Patient's noncompliance with other medical treatment and regimen: Secondary | ICD-10-CM | POA: Diagnosis not present

## 2019-07-28 DIAGNOSIS — Z79899 Other long term (current) drug therapy: Secondary | ICD-10-CM | POA: Diagnosis not present

## 2019-07-28 DIAGNOSIS — Z85028 Personal history of other malignant neoplasm of stomach: Secondary | ICD-10-CM | POA: Diagnosis present

## 2019-07-28 DIAGNOSIS — N401 Enlarged prostate with lower urinary tract symptoms: Secondary | ICD-10-CM | POA: Diagnosis not present

## 2019-07-28 DIAGNOSIS — D649 Anemia, unspecified: Secondary | ICD-10-CM

## 2019-07-28 LAB — CMP (CANCER CENTER ONLY)
ALT: 9 U/L (ref 0–44)
AST: 15 U/L (ref 15–41)
Albumin: 3.2 g/dL — ABNORMAL LOW (ref 3.5–5.0)
Alkaline Phosphatase: 96 U/L (ref 38–126)
Anion gap: 7 (ref 5–15)
BUN: 16 mg/dL (ref 8–23)
CO2: 24 mmol/L (ref 22–32)
Calcium: 8.8 mg/dL — ABNORMAL LOW (ref 8.9–10.3)
Chloride: 110 mmol/L (ref 98–111)
Creatinine: 1.18 mg/dL (ref 0.61–1.24)
GFR, Est AFR Am: >60 mL/min (ref >60)
GFR, Estimated: >60 mL/min (ref >60)
Glucose, Bld: 114 mg/dL — ABNORMAL HIGH (ref 70–99)
Potassium: 4.1 mmol/L (ref 3.5–5.1)
Sodium: 141 mmol/L (ref 135–145)
Total Bilirubin: 0.2 mg/dL — ABNORMAL LOW (ref 0.3–1.2)
Total Protein: 5.8 g/dL — ABNORMAL LOW (ref 6.5–8.1)

## 2019-07-28 LAB — CBC WITH DIFFERENTIAL (CANCER CENTER ONLY)
Abs Immature Granulocytes: 0.05 10*3/uL (ref 0.00–0.07)
Basophils Absolute: 0 10*3/uL (ref 0.0–0.1)
Basophils Relative: 0 %
Eosinophils Absolute: 0 10*3/uL (ref 0.0–0.5)
Eosinophils Relative: 0 %
HCT: 23.5 % — ABNORMAL LOW (ref 39.0–52.0)
Hemoglobin: 6.7 g/dL — CL (ref 13.0–17.0)
Immature Granulocytes: 1 %
Lymphocytes Relative: 14 %
Lymphs Abs: 1 10*3/uL (ref 0.7–4.0)
MCH: 20.8 pg — ABNORMAL LOW (ref 26.0–34.0)
MCHC: 28.5 g/dL — ABNORMAL LOW (ref 30.0–36.0)
MCV: 73 fL — ABNORMAL LOW (ref 80.0–100.0)
Monocytes Absolute: 0.5 10*3/uL (ref 0.1–1.0)
Monocytes Relative: 7 %
Neutro Abs: 5.7 10*3/uL (ref 1.7–7.7)
Neutrophils Relative %: 78 %
Platelet Count: 395 10*3/uL (ref 150–400)
RBC: 3.22 MIL/uL — ABNORMAL LOW (ref 4.22–5.81)
RDW: 15.7 % — ABNORMAL HIGH (ref 11.5–15.5)
WBC Count: 7.4 10*3/uL (ref 4.0–10.5)
nRBC: 0 % (ref 0.0–0.2)

## 2019-07-28 LAB — ABO/RH: ABO/RH(D): O POS

## 2019-07-28 LAB — FERRITIN: Ferritin: <4 ng/mL — ABNORMAL LOW (ref 24–336)

## 2019-07-28 LAB — IRON AND TIBC
Iron: 58 ug/dL (ref 42–163)
Saturation Ratios: 16 % — ABNORMAL LOW (ref 20–55)
TIBC: 368 ug/dL (ref 202–409)
UIBC: 309 ug/dL (ref 117–376)

## 2019-07-28 NOTE — Telephone Encounter (Signed)
Scheduled appts per 3/19 los. Gave pt a print out of AVS

## 2019-07-28 NOTE — Progress Notes (Signed)
Critical value:  Hgb 6.7 Dr. Burr Medico notified.

## 2019-07-29 ENCOUNTER — Other Ambulatory Visit: Payer: Self-pay

## 2019-07-29 ENCOUNTER — Inpatient Hospital Stay: Payer: Medicare Other

## 2019-07-29 DIAGNOSIS — D630 Anemia in neoplastic disease: Secondary | ICD-10-CM

## 2019-07-29 DIAGNOSIS — D500 Iron deficiency anemia secondary to blood loss (chronic): Secondary | ICD-10-CM

## 2019-07-29 DIAGNOSIS — D509 Iron deficiency anemia, unspecified: Secondary | ICD-10-CM | POA: Diagnosis not present

## 2019-07-29 DIAGNOSIS — C162 Malignant neoplasm of body of stomach: Secondary | ICD-10-CM

## 2019-07-29 DIAGNOSIS — D649 Anemia, unspecified: Secondary | ICD-10-CM

## 2019-07-29 LAB — PREPARE RBC (CROSSMATCH)

## 2019-07-29 MED ORDER — SODIUM CHLORIDE 0.9% IV SOLUTION
250.0000 mL | Freq: Once | INTRAVENOUS | Status: AC
  Administered 2019-07-29: 250 mL via INTRAVENOUS
  Filled 2019-07-29: qty 250

## 2019-07-29 MED ORDER — HEPARIN SOD (PORK) LOCK FLUSH 100 UNIT/ML IV SOLN
500.0000 [IU] | Freq: Every day | INTRAVENOUS | Status: AC | PRN
  Administered 2019-07-29: 500 [IU]
  Filled 2019-07-29: qty 5

## 2019-07-29 MED ORDER — SODIUM CHLORIDE 0.9% FLUSH
10.0000 mL | INTRAVENOUS | Status: AC | PRN
  Administered 2019-07-29: 10 mL
  Filled 2019-07-29: qty 10

## 2019-07-29 NOTE — Patient Instructions (Signed)

## 2019-07-29 NOTE — Progress Notes (Signed)
Per Blood bank, Transfuse orders were not put in. Patient is only able to get 1 unit PRBCs due to national shortage on patient's blood type.

## 2019-07-30 ENCOUNTER — Encounter: Payer: Self-pay | Admitting: Hematology

## 2019-07-30 LAB — TYPE AND SCREEN
ABO/RH(D): O POS
Antibody Screen: NEGATIVE
Unit division: 0

## 2019-07-30 LAB — BPAM RBC
Blood Product Expiration Date: 202104172359
ISSUE DATE / TIME: 202103200858
Unit Type and Rh: 5100

## 2019-07-31 ENCOUNTER — Other Ambulatory Visit: Payer: Self-pay | Admitting: Hematology

## 2019-08-01 ENCOUNTER — Other Ambulatory Visit: Payer: Self-pay | Admitting: Hematology

## 2019-08-01 ENCOUNTER — Telehealth: Payer: Self-pay | Admitting: Hematology

## 2019-08-01 ENCOUNTER — Telehealth: Payer: Self-pay

## 2019-08-01 NOTE — Telephone Encounter (Signed)
Scheduled appt per 3/23 sch message - unable to reach pt . Left message with appt date and time   

## 2019-08-01 NOTE — Telephone Encounter (Signed)
-----   Message from Truitt Merle, MD sent at 08/01/2019  9:45 AM EDT ----- Regarding: RE: Blood transfusion 07/29/19 OK, I will order [facility] for him.  Gabriel Poole, please schedule weekly dose X2 for  him ASAP  [name]  ----- Message ----- From: Delane Ginger, [facility] Sent: 08/01/2019   9:00 AM EDT To: Truitt Merle, MD Subject: RE: Blood transfusion 07/29/19                  [employer] Medicare doesn't have a preferred so you can use feraheme or any agent for him.  ----- Message ----- From: Truitt Merle, MD Sent: 08/01/2019   8:45 AM EDT To: Delane Ginger, RPH Subject: FW: Blood transfusion 07/29/19                  Maggie,  Are you able to tell which iv iron is preferred by his insurance company?  Thanks  [name]  ----- Message ----- From: Truitt Merle, MD Sent: 07/30/2019  11:36 AM EDT To: Elwin Mocha, # Subject: RE: Blood transfusion 07/29/19                  My nurse, please let pt know his iron level is very low. Since he only got 1 unit blood yesterday, plan to give iv iron next week (possible multiple infusion).  Brandi, let me know which iv iron his insurance prefers, and if preauth is needed.  Thanks  [name]  ----- Message ----- From: Teodoro Spray, RN Sent: 07/29/2019   9:47 AM EDT To: Truitt Merle, MD Subject: Blood transfusion 07/29/19                      Per Blood bank, there is a shortage on type O+ blood and we were only able to transfuse 1 unit today (Saturday).  Just wanted to make you aware.  Thank you.

## 2019-08-01 NOTE — Telephone Encounter (Signed)
I spoke to Gabriel Poole and let him know his iron was low and that Dr. Burr Medico wants him to have iron infusion this week and next.  He verbalized understanding. Scheduling message sent.

## 2019-08-04 ENCOUNTER — Inpatient Hospital Stay: Payer: Medicare Other

## 2019-08-07 ENCOUNTER — Encounter (INDEPENDENT_AMBULATORY_CARE_PROVIDER_SITE_OTHER): Payer: Self-pay | Admitting: *Deleted

## 2019-08-07 ENCOUNTER — Ambulatory Visit (INDEPENDENT_AMBULATORY_CARE_PROVIDER_SITE_OTHER): Payer: Self-pay

## 2019-08-07 ENCOUNTER — Telehealth: Payer: Self-pay | Admitting: Hematology

## 2019-08-07 ENCOUNTER — Telehealth (INDEPENDENT_AMBULATORY_CARE_PROVIDER_SITE_OTHER): Payer: Self-pay | Admitting: *Deleted

## 2019-08-07 ENCOUNTER — Other Ambulatory Visit: Payer: Self-pay

## 2019-08-07 NOTE — Telephone Encounter (Signed)
Scheduled appt per 3/29 sch message - left message about appt change

## 2019-08-07 NOTE — Telephone Encounter (Signed)
Scheduled appt per sch msg. Called and spoke with patient. Confirmed 3/30 appt

## 2019-08-07 NOTE — Telephone Encounter (Signed)
Referring MD: Burr Medico  PCP: nicholson   Procedure: tcs/egd  Reason/Indication:  IDA, hx gastric ca, hx polyps  Has patient had this procedure before?  Yes, EGD-2018 & TCS -2015  If so, when, by whom and where?    Is there a family history of colon cancer?  no  Who?  What age when diagnosed?    Is patient diabetic?   no      Does patient have prosthetic heart valve or mechanical valve?  no  Do you have a pacemaker/defibrillator?  no  Has patient ever had endocarditis/atrial fibrillation? no  Does patient use oxygen? no  Has patient had joint replacement within last 12 months?  no  Is patient constipated or do they take laxatives? no  Does patient have a history of alcohol/drug use?  no  Is patient on blood thinner such as Coumadin, Plavix and/or Aspirin? yes  Medications: asa 81 mg daily, amlodipine 10 mg daily, atorvastatin 40 mg daily, vit D3 5000 units daily, metoprolol 50 mg bid, potassium 20 meq 1 tab bid, triam.hctz 37.5/25 mg daily  Allergies: pcn  Medication Adjustment per Dr Laural Golden: asa 2 days  Procedure date & time: 08/24/19 at 100

## 2019-08-08 ENCOUNTER — Other Ambulatory Visit: Payer: Self-pay

## 2019-08-08 ENCOUNTER — Inpatient Hospital Stay: Payer: Medicare Other

## 2019-08-08 VITALS — BP 148/80 | HR 61 | Temp 98.4°F | Resp 18

## 2019-08-08 DIAGNOSIS — D509 Iron deficiency anemia, unspecified: Secondary | ICD-10-CM | POA: Diagnosis not present

## 2019-08-08 DIAGNOSIS — Z95828 Presence of other vascular implants and grafts: Secondary | ICD-10-CM

## 2019-08-08 MED ORDER — HEPARIN SOD (PORK) LOCK FLUSH 100 UNIT/ML IV SOLN
500.0000 [IU] | Freq: Once | INTRAVENOUS | Status: AC
  Administered 2019-08-08: 10:00:00 500 [IU] via INTRAVENOUS
  Filled 2019-08-08: qty 5

## 2019-08-08 MED ORDER — LORATADINE 10 MG PO TABS
10.0000 mg | ORAL_TABLET | Freq: Once | ORAL | Status: AC
  Administered 2019-08-08: 08:00:00 10 mg via ORAL

## 2019-08-08 MED ORDER — SODIUM CHLORIDE 0.9 % IV SOLN
510.0000 mg | Freq: Once | INTRAVENOUS | Status: AC
  Administered 2019-08-08: 510 mg via INTRAVENOUS
  Filled 2019-08-08: qty 17

## 2019-08-08 MED ORDER — LORATADINE 10 MG PO TABS
ORAL_TABLET | ORAL | Status: AC
  Filled 2019-08-08: qty 1

## 2019-08-08 MED ORDER — DIPHENHYDRAMINE HCL 25 MG PO CAPS
50.0000 mg | ORAL_CAPSULE | Freq: Once | ORAL | Status: AC
  Administered 2019-08-08: 08:00:00 50 mg via ORAL

## 2019-08-08 MED ORDER — SODIUM CHLORIDE 0.9% FLUSH
10.0000 mL | Freq: Once | INTRAVENOUS | Status: AC
  Administered 2019-08-08: 10:00:00 10 mL via INTRAVENOUS
  Filled 2019-08-08: qty 10

## 2019-08-08 MED ORDER — SODIUM CHLORIDE 0.9 % IV SOLN
Freq: Once | INTRAVENOUS | Status: AC
  Filled 2019-08-08: qty 250

## 2019-08-08 MED ORDER — DIPHENHYDRAMINE HCL 25 MG PO CAPS
ORAL_CAPSULE | ORAL | Status: AC
  Filled 2019-08-08: qty 2

## 2019-08-08 NOTE — Patient Instructions (Signed)
Ferumoxytol injection What is this medicine? FERUMOXYTOL is an iron complex. Iron is used to make healthy red blood cells, which carry oxygen and nutrients throughout the body. This medicine is used to treat iron deficiency anemia. This medicine may be used for other purposes; ask your health care provider or pharmacist if you have questions. COMMON BRAND NAME(S): Feraheme What should I tell my health care provider before I take this medicine? They need to know if you have any of these conditions:  anemia not caused by low iron levels  high levels of iron in the blood  magnetic resonance imaging (MRI) test scheduled  an unusual or allergic reaction to iron, other medicines, foods, dyes, or preservatives  pregnant or trying to get pregnant  breast-feeding How should I use this medicine? This medicine is for injection into a vein. It is given by a health care professional in a hospital or clinic setting. Talk to your pediatrician regarding the use of this medicine in children. Special care may be needed. Overdosage: If you think you have taken too much of this medicine contact a poison control center or emergency room at once. NOTE: This medicine is only for you. Do not share this medicine with others. What if I miss a dose? It is important not to miss your dose. Call your doctor or health care professional if you are unable to keep an appointment. What may interact with this medicine? This medicine may interact with the following medications:  other iron products This list may not describe all possible interactions. Give your health care provider a list of all the medicines, herbs, non-prescription drugs, or dietary supplements you use. Also tell them if you smoke, drink alcohol, or use illegal drugs. Some items may interact with your medicine. What should I watch for while using this medicine? Visit your doctor or healthcare professional regularly. Tell your doctor or healthcare  professional if your symptoms do not start to get better or if they get worse. You may need blood work done while you are taking this medicine. You may need to follow a special diet. Talk to your doctor. Foods that contain iron include: whole grains/cereals, dried fruits, beans, or peas, leafy green vegetables, and organ meats (liver, kidney). What side effects may I notice from receiving this medicine? Side effects that you should report to your doctor or health care professional as soon as possible:  allergic reactions like skin rash, itching or hives, swelling of the face, lips, or tongue  breathing problems  changes in blood pressure  feeling faint or lightheaded, falls  fever or chills  flushing, sweating, or hot feelings  swelling of the ankles or feet Side effects that usually do not require medical attention (report to your doctor or health care professional if they continue or are bothersome):  diarrhea  headache  nausea, vomiting  stomach pain This list may not describe all possible side effects. Call your doctor for medical advice about side effects. You may report side effects to FDA at 1-800-FDA-1088. Where should I keep my medicine? This drug is given in a hospital or clinic and will not be stored at home. NOTE: This sheet is a summary. It may not cover all possible information. If you have questions about this medicine, talk to your doctor, pharmacist, or health care provider.  2020 Elsevier/Gold Standard (2016-06-15 20:21:10) Coronavirus (COVID-19) Are you at risk?  Are you at risk for the Coronavirus (COVID-19)?  To be considered HIGH RISK for Coronavirus (COVID-19),   you have to meet the following criteria:  . Traveled to [place], [place], [place], [place] or [place]; or in the [place] to [place], [place], [place], or [place]; and have fever, cough, and shortness of breath within the last 2 weeks of travel OR . Been in close contact with a person  diagnosed with COVID-19 within the last 2 weeks and have fever, cough, and shortness of breath . IF YOU DO NOT MEET THESE CRITERIA, YOU ARE CONSIDERED LOW RISK FOR COVID-19.  What to do if you are HIGH RISK for COVID-19?  . If you are having a medical emergency, call 911. . Seek medical care right away. Before you go to a doctor's office, urgent care or emergency department, call ahead and tell them about your recent travel, contact with someone diagnosed with COVID-19, and your symptoms. You should receive instructions from your physician's office regarding next steps of care.  . When you arrive at healthcare provider, tell the healthcare staff immediately you have returned from visiting [place], [place], [place], [place] or [place]; or traveled in the [place] to [place], [place], [place], or [place]; in the last two weeks or you have been in close contact with a person diagnosed with COVID-19 in the last 2 weeks.   . Tell the health care staff about your symptoms: fever, cough and shortness of breath. . After you have been seen by a medical provider, you will be either: o Tested for (COVID-19) and discharged home on quarantine except to seek medical care if symptoms worsen, and asked to  - Stay home and avoid contact with others until you get your results (4-5 days)  - Avoid travel on public transportation if possible (such as bus, train, or airplane) or o Sent to the Emergency Department by EMS for evaluation, COVID-19 testing, and possible admission depending on your condition and test results.  What to do if you are LOW RISK for COVID-19?  Reduce your risk of any infection by using the same precautions used for avoiding the common cold or flu:  . Wash your hands often with soap and warm water for at least 20 seconds.  If soap and water are not readily available, use an alcohol-based hand sanitizer with at least 60% alcohol.  . If coughing or sneezing, cover your mouth and nose by  coughing or sneezing into the elbow areas of your shirt or coat, into a tissue or into your sleeve (not your hands). . Avoid shaking hands with others and consider head nods or verbal greetings only. . Avoid touching your eyes, nose, or mouth with unwashed hands.  . Avoid close contact with people who are sick. . Avoid places or events with large numbers of people in one location, like concerts or sporting events. . Carefully consider travel plans you have or are making. . If you are planning any travel outside or inside the [place], visit the CDC's Travelers' Health webpage for the latest health notices. . If you have some symptoms but not all symptoms, continue to monitor at home and seek medical attention if your symptoms worsen. . If you are having a medical emergency, call 911.   ADDITIONAL HEALTHCARE OPTIONS FOR PATIENTS  [name] / e-Visit: [email or web address]         [facility]: [phone]  [facility]: [phone]                   [facility]: [phone]   

## 2019-08-11 ENCOUNTER — Other Ambulatory Visit: Payer: Medicare Other

## 2019-08-11 ENCOUNTER — Ambulatory Visit: Payer: Medicare Other | Admitting: Hematology

## 2019-08-11 ENCOUNTER — Ambulatory Visit: Payer: Medicare Other

## 2019-08-14 NOTE — Telephone Encounter (Signed)
EGD and colonoscopy to be scheduled with conscious sedation.

## 2019-08-16 ENCOUNTER — Ambulatory Visit (HOSPITAL_COMMUNITY)
Admission: RE | Admit: 2019-08-16 | Discharge: 2019-08-16 | Disposition: A | Discharge status: Home / Self Care | Payer: Medicare Other | Source: Ambulatory Visit | Attending: Hematology | Admitting: Hematology

## 2019-08-16 ENCOUNTER — Other Ambulatory Visit: Payer: Self-pay

## 2019-08-16 DIAGNOSIS — C162 Malignant neoplasm of body of stomach: Secondary | ICD-10-CM | POA: Insufficient documentation

## 2019-08-16 MED ORDER — IOHEXOL 300 MG/ML  SOLN
100.0000 mL | Freq: Once | INTRAMUSCULAR | Status: AC | PRN
  Administered 2019-08-16: 17:00:00 100 mL via INTRAVENOUS

## 2019-08-16 MED ORDER — SODIUM CHLORIDE (PF) 0.9 % IJ SOLN
INTRAMUSCULAR | Status: AC
  Filled 2019-08-16: qty 50

## 2019-08-16 MED ORDER — HEPARIN SOD (PORK) LOCK FLUSH 100 UNIT/ML IV SOLN
INTRAVENOUS | Status: AC
  Filled 2019-08-16: qty 5

## 2019-08-17 ENCOUNTER — Inpatient Hospital Stay: Payer: Medicare Other

## 2019-08-17 ENCOUNTER — Inpatient Hospital Stay: Payer: Medicare Other | Attending: Hematology | Admitting: Nurse Practitioner

## 2019-08-17 DIAGNOSIS — Z85028 Personal history of other malignant neoplasm of stomach: Secondary | ICD-10-CM | POA: Insufficient documentation

## 2019-08-17 DIAGNOSIS — N400 Enlarged prostate without lower urinary tract symptoms: Secondary | ICD-10-CM | POA: Insufficient documentation

## 2019-08-17 DIAGNOSIS — Z79899 Other long term (current) drug therapy: Secondary | ICD-10-CM | POA: Insufficient documentation

## 2019-08-17 DIAGNOSIS — I712 Thoracic aortic aneurysm, without rupture: Secondary | ICD-10-CM | POA: Insufficient documentation

## 2019-08-17 DIAGNOSIS — D509 Iron deficiency anemia, unspecified: Secondary | ICD-10-CM | POA: Insufficient documentation

## 2019-08-17 DIAGNOSIS — R634 Abnormal weight loss: Secondary | ICD-10-CM | POA: Insufficient documentation

## 2019-08-20 NOTE — Progress Notes (Signed)
[facility]   Telephone:([phone] Fax:([phone]) [phone]   Clinic Follow up Note   Patient Care Team: Wannetta Sender, FNP as PCP - General (Family Medicine) Truitt Merle, MD as Consulting Physician (Hematology) Alla Feeling, NP as Nurse Practitioner (Nurse Practitioner) Stark Klein, MD as Consulting Physician (General Surgery) Rogene Houston, MD as Consulting Physician (Gastroenterology) Milus Banister, MD as Attending Physician (Gastroenterology) 08/21/2019  CHIEF COMPLAINT: F/u gastric cancer, IDA  SUMMARY OF ONCOLOGIC HISTORY: Oncology History Overview Note  Cancer Staging Malignant neoplasm of body of stomach ([place]) Staging form: Stomach, AJCC 8th Edition - Clinical stage from 03/11/2017: Stage IIB (cT3, cN0, cM0) - Unsigned - Pathologic stage from 04/22/2017: Stage IIIB (pT4a, pN3a, cM0) - Signed by Alla Feeling, NP on 05/17/2017     Malignant neoplasm of body of stomach ([place])  02/17/2017 Initial Diagnosis   Malignant neoplasm of body of stomach ([place])   02/17/2017 Pathology Results   Diagnosis Stomach, biopsy, gastric ulcer - ADENOCARCINOMA. Microscopic Comment Several of the fragments are involved by moderately differentiated adenocarcinoma.   02/17/2017 Procedure   UPPER ENDOSCOPY  FINDINGS - Normal esophagus. - Z-line irregular, 44 cm from the incisors. - Red blood in the gastric body and in the gastric antrum. - Large gastric ulcer. Biopsied. - Erythematous mucosa in the antrum. - Normal cardia, gastric fundus, gastric body and pylorus. - Normal duodenal bulb and second portion of the duodenum. Comment: Endoscopic appearence concerning for malignant ulcer.   03/11/2017 Procedure   UPPER EUS PER DR. Ardis Hughs Findings: 1. The esophagus was normal. 2. There was a 2-3cm ulcerated, malignant mass along the greater curvature of the stomach, approximately mid-body. The mass was non-circumferential. 3. The duodenum was  normal. Endosonographic Finding 1. The gastric mass above correlated with a hypoechoic and heterogenous non-circumferential mass that measured 2.9cm across, 36m deep. The endosonographic borders were poorly defined and there was clear sonographic evidence suggesting invasion into and through the muscularis propria layer without evident invasion into nearby organs (uT3). 2. The duodenal lymphnode described on recent CT scan appeared reactive by [place]. 3. No perigastric adenopathy (uN0) 4. Limited views of the liver, spleen, pancreas, bile duct, gallbladder were all normal. - Along the greater curvature of the stomach, approximately mid-body, there is a 2.9cm uT3N0 (clinical stage IIB) gastric adenocarcinoma.   04/06/2017 Imaging   CT CHEST IMPRESSION: 1. No acute cardiopulmonary abnormalities. 2. Two small pulmonary nodules are noted measuring up to 5 mm. Nonspecific but warrant attention on follow-up imaging follow up 3. Nonspecific hyperdense, possibly enhancing lesion along the dome of liver is identified. In a patient that is at increased risk for more definitive assessment of this structure with contrast enhanced MRI of the liver is advised.   04/21/2017 Imaging   MR ABDOMEN IMPRESSION: 1. Enhancing 2.9 cm mass along the lesser curvature in the gastric antrum, compatible with known gastric malignancy . 2. Mildly enlarged gastrohepatic ligament lymph node, cannot exclude nodal metastasis. 3. No definite liver metastatic disease. Hyperenhancing 0.9 cm liver dome mass remains inconclusive, although the MRI features are most suggestive of a flash filling hemangioma, and the mass has been stable for nearly 2 months. Additional subcentimeter focus of hyperenhancement in the lateral segment left liver lobe is most likely a benign transient vascular phenomenon. Recommend attention to these lesions on a follow-up MRI abdomen without and with IV contrast in 3-6 months. This  recommendation follows ACR consensus guidelines: Management of Incidental Liver Lesions on  CT: A White Paper of the ACR Incidental Findings Committee. J Am Coll Radiol 2017; 99:3716-9678. 4. Benign right adrenal adenoma.     04/22/2017 Pathology Results   Diagnosis 1. Liver, biopsy - BILE DUCT HAMARTOMA. - THERE IS NO EVIDENCE OF MALIGNANCY. 2. Lymph nodes, regional resection, portal - METASTATIC CARCINOMA IN 2 OF 6 LYMPH NODES (2/6). 3. Stomach, resection for tumor, distal - INVASIVE ADENOCARCINOMA, POORLY DIFFERENTIATED, SPANNING 3.8 CM. - PERINEURAL INVASION IS IDENTIFIED. - ADENOCARCINOMA INVOLVES THE SEROSA. - METASTATIC CARCINOMA IN 12 OF 30 LYMPH NODES (12/30), WITH EXTRACAPSULAR EXTENSION. - SEE ONCOLOGY TABLE BELOW. 4. Lymph node, biopsy, common hepatic artery - THERE IS NO EVIDENCE OF CARCINOMA IN 1 OF 1 LYMPH NODE (0/1). Microscopic Comment 3. STOMACH: Specimen: Stomach. Procedure: Partial gastrectomy. Tumor Site: Greater curvature. Tumor Size: 3.8 cm Histologic Type: Adenocarcinoma. Histologic Grade: G3: poorly differentiated. Microscopic Extent of Tumor: Adenocarcinoma involves the serosa. Margins (select all that apply): Adenoca Proximal Margin: Negative for adenocarcinoma. Distal Margin: Negative for adenocarcinoma. Treatment Effect: N/A Lymph-Vascular Invasion: Not identified. Perineural Invasion: Present. Additional findings: Chronic gastritis. Ancillary testing: Can be performed upon clinician request. 1 of 3 Duplicate copy FINAL for ADIR, SCHICKER ([id number]) Microscopic Comment(continued) Lymph nodes: number examined - 37; number positive: 14 Pathologic Staging: pT4a, pN3a (JBK:gt, 04/27/17)   06/02/2017 - 09/30/2017 Adjuvant Chemotherapy   FOLFOX every 2 weeks, oxaliplatin stopped in March 2019 due to neuropathy, chemo stopped on 09/30/2017 per pt's request    10/29/2017 Imaging   10/29/2017 CT CAP  IMPRESSION: 1. Status post distal  gastrectomy. No evidence for metastatic disease in the chest, abdomen, or pelvis. 2. Stable tiny right pulmonary nodules. Continued attention on follow-up recommended. 3. 9 mm hypervascular lesion in the dome of the liver is stable over multiple studies back to 04/05/2017. Continued attention on follow-up suggested. 4. Ascending thoracic aorta measures 4.2 cm diameter. Recommend annual imaging followup by CTA or MRA. This recommendation follows 2010 ACCF/AHA/AATS/ACR/ASA/SCA/SCAI/SIR/STS/SVM Guidelines for the Diagnosis and Management of Patients with Thoracic Aortic Disease. Circulation. 2010; 121: W258-N277 . 5. Marked prostatomegaly. 6.  Aortic Atherosclerois (ICD10-170.0)   05/09/2018 Imaging   05/09/2018 CT CAP IMPRESSION: 1. No definite findings to suggest metastatic disease to the chest, abdomen or pelvis. 2. Multiple small pulmonary nodules scattered throughout the lungs bilaterally measuring 5 mm or less in size, stable compared to prior studies from 2018, favored to be benign. Continued attention on future follow-up examinations is recommended. 3. Aortic atherosclerosis with ectasia of the ascending thoracic aorta (4 cm in diameter). Recommend annual imaging followup by CTA or MRA. This recommendation follows 2010 ACCF/AHA/AATS/ACR/ASA/SCA/SCAI/SIR/STS/SVM Guidelines for the Diagnosis and Management of Patients with Thoracic Aortic Disease. Circulation. 2010; 121: O242-P536. 4. Multiple small calculi lying dependently in the right-side of the urinary bladder. 5. Severe prostatomegaly. 6. Additional incidental findings, as above.   04/04/2019 Imaging   CT AP IMPRESSION: 1. Redemonstrated postoperative findings of distal gastrectomy and gastrojejunostomy (series 2, image 18, series 4, image 27). No evidence of malignant recurrence or metastatic disease in the abdomen or pelvis. 2. Subcutaneous body fat is substantially diminished in comparison to prior examination,  in keeping with reported history of weight loss. 3. Gross prostatomegaly and urinary bladder wall thickening, likely due to chronic outlet obstruction. 4.  Aortic Atherosclerosis (ICD10-I70.0).   08/16/2019 Imaging   CT CAP w contrast IMPRESSION: 1. No acute findings within the chest, abdomen or pelvis. No specific findings identified to suggest residual or recurrent tumor or metastatic disease. 2.  Small nonspecific pulmonary nodules are unchanged. 3. Marked enlargement of the prostate gland. 4. Multiple tiny bladder stones. 5. Aortic atherosclerosis. Aortic Atherosclerosis (ICD10-I70.0).     CURRENT THERAPY:  1. Surveillance for h/o gastric cancer s/p surgical resection and 4 months adjuvant chemo (FOLFOX) 2. IV Feraheme PRN for IDA, pending endoscopic evaluation   INTERVAL HISTORY: Gabriel Poole returns for f/u and another Feraheme infusion as scheduled. His dizziness and nausea resolved after last blood transfusion. He feels well in general without specific complaints. He works 33 hours per week. He remains active and functional. Denies bleeding or black stools. He does not tolerate oral iron, caused him to "pee blood." He denies dysuria, frequency, urgency; he voids once at night. He has urologist in [place]. Has GI work up scheduled on 4/15. Denies n/v/c/d or abdominal pain. He finds it hard to digest fast food. Otherwise, denies fever, chills, cough, chest pain, dyspnea, leg edema, change in bowel function, abdominal pain, or other concerns.     MEDICAL HISTORY:  Past Medical History:  Diagnosis Date  . Arthritis   . Cancer [facility]) dx oct 02-2017   stomach  . Gout   . Heart murmur   . Hyperlipidemia   . Hypertension   . Stomach cancer [facility])     SURGICAL HISTORY: Past Surgical History:  Procedure Laterality Date  . COLONOSCOPY    . ESOPHAGOGASTRODUODENOSCOPY N/A 02/17/2017   Procedure: ESOPHAGOGASTRODUODENOSCOPY (EGD);  Surgeon: Rogene Houston, MD;  [facility];  Service: Endoscopy;  Laterality: N/A;  3:00  . EUS N/A 03/11/2017   Procedure: UPPER ENDOSCOPIC ULTRASOUND (EUS) LINEAR;  Surgeon: Milus Banister, MD;  Location: [facility];  Service: Endoscopy;  Laterality: N/A;  . EUS N/A 03/11/2017   Procedure: UPPER ENDOSCOPIC ULTRASOUND (EUS) RADIAL;  Surgeon: Milus Banister, MD;  Location[facility];  Service: Endoscopy;  Laterality: N/A;  . FINGER SURGERY     Lt middle   . GASTRECTOMY N/A 04/22/2017   Procedure: PARTIAL GASTRECTOMY;  Surgeon: Stark Klein, MD;  Location: [facility];  Service: General;  Laterality: N/A;  . GASTROJEJUNOSTOMY N/A 04/22/2017   Procedure: JEJUNAL FEEDING TUBE PLACEMENT;  Surgeon: Stark Klein, MD;  Location: [facility];  Service: General;  Laterality: N/A;  . HYDROCELE EXCISION  02/12/2011   Procedure: HYDROCELECTOMY ADULT;  Surgeon: Marissa Nestle;  Location: [facility] ORS;  Service: Urology;  Laterality: Left;  . LAPAROSCOPY N/A 04/22/2017   Procedure: LAPAROSCOPY DIAGNOSTIC ERAS PATHWAY;  Surgeon: Stark Klein, MD;  Location: [facility];  Service: General;  Laterality: N/A;  EPIDURAL  . PORTACATH PLACEMENT N/A 05/28/2017   Procedure: INSERTION PORT-A-CATH ERAS PATHWAY;  Surgeon: Stark Klein, MD;  Location: [facility];  Service: General;  Laterality: N/A;    I have reviewed the social history and family history with the patient and they are unchanged from previous note.  ALLERGIES:  is allergic to penicillins.  MEDICATIONS:  Current Outpatient Medications  Medication Sig Dispense Refill  . amLODipine (NORVASC) 10 MG tablet Take 1 tablet (10 mg total) by mouth daily. 90 tablet 0  . aspirin EC 81 MG tablet Take 81 mg by mouth daily.    Marland Kitchen atorvastatin (LIPITOR) 40 MG tablet Take 1 tablet (40 mg total) by mouth daily at 6 PM. 90 tablet 0  . Cholecalciferol (VITAMIN D3) 5000 units CAPS Take 5,000 Units by mouth daily.    Marland Kitchen gabapentin (NEURONTIN) 300 MG capsule Take 300 mg by mouth daily.    Marland Kitchen  metoprolol  tartrate (LOPRESSOR) 50 MG tablet Take 50 mg by mouth 2 (two) times daily.    . potassium chloride SA (K-DUR,KLOR-CON) 20 MEQ tablet Take 1 tablet (20 mEq total) by mouth 2 (two) times daily. (Patient taking differently: Take 20 mEq by mouth daily. ) 40 tablet 1  . triamterene-hydrochlorothiazide (MAXZIDE-25) 37.5-25 MG tablet Take 1 tablet by mouth daily.     No current facility-administered medications for this visit.   Facility-Administered Medications Ordered in Other Visits  Medication Dose Route Frequency Provider Last Rate Last Admin  . 0.9 %  sodium chloride infusion   Intravenous Once Truitt Merle, MD      . ferumoxytol [facility]) 510 mg in sodium chloride 0.9 % 100 mL IVPB  510 mg Intravenous Once Truitt Merle, MD      . heparin lock flush 100 unit/mL  500 Units Intravenous Once PRN Truitt Merle, MD      . sodium chloride flush (NS) 0.9 % injection 10 mL  10 mL Intravenous PRN Truitt Merle, MD        PHYSICAL EXAMINATION: ECOG PERFORMANCE STATUS: 1 - Symptomatic but completely ambulatory  Vitals:   08/21/19 0949  BP: 140/76  Pulse: 60  Resp: 20  Temp: 98.2 F (36.8 C)  SpO2: 100%   Filed Weights   08/21/19 0949  Weight: 139 lb (63 kg)    GENERAL:alert, no distress and comfortable SKIN: no rash  EYES: sclera clear LUNGS: clear with normal breathing effort HEART: regular rate & rhythm, no lower extremity edema ABDOMEN: abdomen soft, non-tender and normal bowel sounds NEURO: alert & oriented x 3 with fluent speech, normal gait PAC without erythema   LABORATORY DATA:  I have reviewed the data as listed CBC Latest Ref Rng & Units 08/21/2019 07/28/2019 03/30/2019  WBC 4.0 - 10.5 K/uL 7.0 7.4 9.2  Hemoglobin 13.0 - 17.0 g/dL 8.8(L) 6.7(LL) 10.2(L)  Hematocrit 39.0 - 52.0 % 31.0(L) 23.5(L) 33.7(L)  Platelets 150 - 400 K/uL 280 395 305     CMP Latest Ref Rng & Units 08/21/2019 07/28/2019 03/30/2019  Glucose 70 - 99 mg/dL 81 114(H) 83  BUN 8 - 23 mg/dL _0 Creatinine 0.61 -  1.24 mg/dL 0.98 1.18 1.11  Sodium 135 - 145 mmol/L 141 141 142  Potassium 3.5 - 5.1 mmol/L 3.9 4.1 4.2  Chloride 98 - 111 mmol/L 108 110 107  CO2 22 - 32 mmol/L _1 Calcium 8.9 - 10.3 mg/dL 8.9 8.8(L) 9.4  Total Protein 6.5 - 8.1 g/dL 5.8(L) 5.8(L) 6.3(L)  Total Bilirubin 0.3 - 1.2 mg/dL 0.2(L) 0.2(L) 0.4  Alkaline Phos 38 - 126 U/L 94 96 103  AST 15 - 41 U/L _2 ALT 0 - 44 U/L _3 RADIOGRAPHIC STUDIES: I have personally reviewed the radiological images as listed and agreed with the findings in the report. No results found.   ASSESSMENT & PLAN: Gabriel Poole a 74 y.o.malewith   1. Primary adenocarcinoma of the pyloric antrum, pT4a, PN3aM0, stage IIIB, Grade3, MSI-stable -Diagnosed in 02/2017.S/psurgical resection and adjuvant chemotherapyFOLFOX. Patient was not compliant with chemo, oxaliplatin stopped after 4 cycles, and 5-FU stopped after 8 cycles treatment per his request. -His last colonoscopy was in 2015, previously followed by Dr. Paulita Fujita - [facility] on 11/24 showed redemonstrated postop findings from previous cancer resection, no evidence of recurrence or metastasis in abdomen or pelvis. -He remains on surveillance.  -Gabriel Poole appears  stable. I personally reviewed and discussed CT CAP from 08/16/19 which shows no evidence of recurrent or metastatic disease. CBC shows persistent anemia, but better after RBC, and CMP stable. Iron studies improved after feraheme.  -Will continue gastric cancer surveillance.  -we reviewed s/sx of recurrence including new abdominal pain, bloating, continued weight loss, unexplained fatigue, or bowel changes.   -f/u next month to review GI work up  2. Recurrent iron deficiency anemia  -he initially had low serum iron and low transferrin saturation in 06/2017 after cancer resection; iron and Ferritin were normal in the interval.  -he developed worsening anemia in 03/2019, Hg 10.2 on 11/19, ferritin 12, serum iron 21  and 6% transferrin saturation, consistent with iron deficiency  -He received RBC transfusion on 3/20 when Hgb dropped to 6.7 and he became symptomatic with dizziness, fatigue, nausea. Symptoms resolved after transfusion.  -He did not tolerate oral iron, he received IV Feraheme on 3/30 and will get second dose today. Continue as needed  -Hgb improved to 8.8 today, Ferritin increased to 122 (from <4 three weeks ago) after first dose Feraheme; he responded well -GI work up per Dr. Laural Golden scheduled on 4/15, will f/u on the report and see him back with repeat labs next month  3. Weight loss -likely related to prior surgery -previously referred to dietician -he has gained a couple lbs since last visit, continue supplements as needed   4.Ascendingaortic aneurysm -stable at 4 cm per CT CAP on 08/16/19  5. Smokingcessation -previously counseled   6. BPH and elevated PSA -severe prostatomegalynoted on multiple scans including CT CAP from 08/16/19 -has urologist in [place] -denies urinary symptoms currently     PLAN: -Labs and CT reviewed, no evidence of recurrence -Continue surveillance -second dose IV Feraheme today -Proceed with GI work up for [facility] on 4/15, per Dr. Laural Golden -Hold aspirin until GI work up is complete -Lab and f/u 4 weeks to review GI work up   No problem-specific [facility] notes found for this encounter.   No orders of the defined types were placed in this encounter.  All questions were answered. The patient knows to call the clinic with any problems, questions or concerns. No barriers to learning was detected. Total encounter time was 35 minutes.      Alla Feeling, NP 08/21/19

## 2019-08-21 ENCOUNTER — Inpatient Hospital Stay: Payer: Medicare Other

## 2019-08-21 ENCOUNTER — Other Ambulatory Visit: Payer: Self-pay

## 2019-08-21 ENCOUNTER — Inpatient Hospital Stay (HOSPITAL_BASED_OUTPATIENT_CLINIC_OR_DEPARTMENT_OTHER): Payer: Medicare Other | Admitting: Nurse Practitioner

## 2019-08-21 ENCOUNTER — Encounter: Payer: Self-pay | Admitting: Nurse Practitioner

## 2019-08-21 ENCOUNTER — Other Ambulatory Visit (INDEPENDENT_AMBULATORY_CARE_PROVIDER_SITE_OTHER): Payer: Self-pay | Admitting: *Deleted

## 2019-08-21 VITALS — BP 140/76 | HR 60 | Temp 98.2°F | Resp 20 | Ht 70.0 in | Wt 139.0 lb

## 2019-08-21 VITALS — BP 135/75 | HR 62 | Temp 97.9°F | Resp 18

## 2019-08-21 DIAGNOSIS — N400 Enlarged prostate without lower urinary tract symptoms: Secondary | ICD-10-CM | POA: Diagnosis not present

## 2019-08-21 DIAGNOSIS — C162 Malignant neoplasm of body of stomach: Secondary | ICD-10-CM | POA: Diagnosis not present

## 2019-08-21 DIAGNOSIS — D649 Anemia, unspecified: Secondary | ICD-10-CM | POA: Diagnosis not present

## 2019-08-21 DIAGNOSIS — R634 Abnormal weight loss: Secondary | ICD-10-CM | POA: Diagnosis not present

## 2019-08-21 DIAGNOSIS — I712 Thoracic aortic aneurysm, without rupture: Secondary | ICD-10-CM | POA: Diagnosis not present

## 2019-08-21 DIAGNOSIS — Z95828 Presence of other vascular implants and grafts: Secondary | ICD-10-CM

## 2019-08-21 DIAGNOSIS — D509 Iron deficiency anemia, unspecified: Secondary | ICD-10-CM | POA: Diagnosis present

## 2019-08-21 DIAGNOSIS — Z79899 Other long term (current) drug therapy: Secondary | ICD-10-CM | POA: Diagnosis not present

## 2019-08-21 DIAGNOSIS — Z85028 Personal history of other malignant neoplasm of stomach: Secondary | ICD-10-CM | POA: Diagnosis present

## 2019-08-21 LAB — CBC WITH DIFFERENTIAL (CANCER CENTER ONLY)
Abs Immature Granulocytes: 0.04 10*3/uL (ref 0.00–0.07)
Basophils Absolute: 0 10*3/uL (ref 0.0–0.1)
Basophils Relative: 1 %
Eosinophils Absolute: 0.1 10*3/uL (ref 0.0–0.5)
Eosinophils Relative: 1 %
HCT: 31 % — ABNORMAL LOW (ref 39.0–52.0)
Hemoglobin: 8.8 g/dL — ABNORMAL LOW (ref 13.0–17.0)
Immature Granulocytes: 1 %
Lymphocytes Relative: 17 %
Lymphs Abs: 1.2 10*3/uL (ref 0.7–4.0)
MCH: 23.7 pg — ABNORMAL LOW (ref 26.0–34.0)
MCHC: 28.4 g/dL — ABNORMAL LOW (ref 30.0–36.0)
MCV: 83.3 fL (ref 80.0–100.0)
Monocytes Absolute: 0.5 10*3/uL (ref 0.1–1.0)
Monocytes Relative: 7 %
Neutro Abs: 5.2 10*3/uL (ref 1.7–7.7)
Neutrophils Relative %: 73 %
Platelet Count: 280 10*3/uL (ref 150–400)
RBC: 3.72 MIL/uL — ABNORMAL LOW (ref 4.22–5.81)
RDW: 22.9 % — ABNORMAL HIGH (ref 11.5–15.5)
WBC Count: 7 10*3/uL (ref 4.0–10.5)
nRBC: 0 % (ref 0.0–0.2)

## 2019-08-21 LAB — CMP (CANCER CENTER ONLY)
ALT: 11 U/L (ref 0–44)
AST: 17 U/L (ref 15–41)
Albumin: 3.2 g/dL — ABNORMAL LOW (ref 3.5–5.0)
Alkaline Phosphatase: 94 U/L (ref 38–126)
Anion gap: 6 (ref 5–15)
BUN: 12 mg/dL (ref 8–23)
CO2: 27 mmol/L (ref 22–32)
Calcium: 8.9 mg/dL (ref 8.9–10.3)
Chloride: 108 mmol/L (ref 98–111)
Creatinine: 0.98 mg/dL (ref 0.61–1.24)
GFR, Est AFR Am: >60 mL/min (ref >60)
GFR, Estimated: >60 mL/min (ref >60)
Glucose, Bld: 81 mg/dL (ref 70–99)
Potassium: 3.9 mmol/L (ref 3.5–5.1)
Sodium: 141 mmol/L (ref 135–145)
Total Bilirubin: 0.2 mg/dL — ABNORMAL LOW (ref 0.3–1.2)
Total Protein: 5.8 g/dL — ABNORMAL LOW (ref 6.5–8.1)

## 2019-08-21 LAB — IRON AND TIBC
Iron: 24 ug/dL — ABNORMAL LOW (ref 42–163)
Saturation Ratios: 8 % — ABNORMAL LOW (ref 20–55)
TIBC: 308 ug/dL (ref 202–409)
UIBC: 284 ug/dL (ref 117–376)

## 2019-08-21 LAB — FERRITIN: Ferritin: 122 ng/mL (ref 24–336)

## 2019-08-21 MED ORDER — DIPHENHYDRAMINE HCL 25 MG PO CAPS
ORAL_CAPSULE | ORAL | Status: AC
  Filled 2019-08-21: qty 2

## 2019-08-21 MED ORDER — LORATADINE 10 MG PO TABS
10.0000 mg | ORAL_TABLET | Freq: Once | ORAL | Status: AC
  Administered 2019-08-21: 10 mg via ORAL

## 2019-08-21 MED ORDER — SODIUM CHLORIDE 0.9% FLUSH
10.0000 mL | INTRAVENOUS | Status: DC | PRN
  Administered 2019-08-21: 13:00:00 10 mL via INTRAVENOUS
  Filled 2019-08-21: qty 10

## 2019-08-21 MED ORDER — LORATADINE 10 MG PO TABS
ORAL_TABLET | ORAL | Status: AC
  Filled 2019-08-21: qty 1

## 2019-08-21 MED ORDER — SODIUM CHLORIDE 0.9 % IV SOLN
Freq: Once | INTRAVENOUS | Status: AC
  Filled 2019-08-21: qty 250

## 2019-08-21 MED ORDER — HEPARIN SOD (PORK) LOCK FLUSH 100 UNIT/ML IV SOLN
500.0000 [IU] | Freq: Once | INTRAVENOUS | Status: AC | PRN
  Administered 2019-08-21: 500 [IU] via INTRAVENOUS
  Filled 2019-08-21: qty 5

## 2019-08-21 MED ORDER — SODIUM CHLORIDE 0.9% FLUSH
10.0000 mL | INTRAVENOUS | Status: DC | PRN
  Administered 2019-08-21: 10 mL via INTRAVENOUS
  Filled 2019-08-21: qty 10

## 2019-08-21 MED ORDER — DIPHENHYDRAMINE HCL 25 MG PO CAPS
50.0000 mg | ORAL_CAPSULE | Freq: Once | ORAL | Status: AC
  Administered 2019-08-21: 11:00:00 50 mg via ORAL

## 2019-08-21 MED ORDER — SODIUM CHLORIDE 0.9 % IV SOLN
510.0000 mg | Freq: Once | INTRAVENOUS | Status: AC
  Administered 2019-08-21: 11:00:00 510 mg via INTRAVENOUS
  Filled 2019-08-21: qty 510

## 2019-08-21 NOTE — Patient Instructions (Signed)

## 2019-08-22 ENCOUNTER — Telehealth: Payer: Self-pay | Admitting: Nurse Practitioner

## 2019-08-22 ENCOUNTER — Encounter (INDEPENDENT_AMBULATORY_CARE_PROVIDER_SITE_OTHER): Payer: Self-pay | Admitting: *Deleted

## 2019-08-22 ENCOUNTER — Other Ambulatory Visit (HOSPITAL_COMMUNITY)
Admission: RE | Admit: 2019-08-22 | Discharge: 2019-08-22 | Disposition: A | Discharge status: Home / Self Care | Payer: Medicare Other | Source: Ambulatory Visit | Attending: Internal Medicine | Admitting: Internal Medicine

## 2019-08-22 DIAGNOSIS — Z01812 Encounter for preprocedural laboratory examination: Secondary | ICD-10-CM | POA: Insufficient documentation

## 2019-08-22 DIAGNOSIS — Z20822 Contact with and (suspected) exposure to covid-19: Secondary | ICD-10-CM | POA: Diagnosis not present

## 2019-08-22 NOTE — Telephone Encounter (Signed)
Scheduled appt per 4/12 los.  Spoke with pt and he is aware of his appt date and time.

## 2019-08-23 ENCOUNTER — Encounter (INDEPENDENT_AMBULATORY_CARE_PROVIDER_SITE_OTHER): Payer: Self-pay | Admitting: *Deleted

## 2019-08-23 ENCOUNTER — Other Ambulatory Visit (INDEPENDENT_AMBULATORY_CARE_PROVIDER_SITE_OTHER): Payer: Self-pay | Admitting: *Deleted

## 2019-08-23 LAB — SARS CORONAVIRUS 2 (TAT 6-24 HRS): SARS Coronavirus 2: NEGATIVE

## 2019-09-12 ENCOUNTER — Other Ambulatory Visit: Payer: Self-pay | Admitting: Hematology

## 2019-09-18 ENCOUNTER — Inpatient Hospital Stay: Payer: Medicare Other | Admitting: Nurse Practitioner

## 2019-09-18 ENCOUNTER — Inpatient Hospital Stay: Payer: Medicare Other

## 2019-09-18 ENCOUNTER — Other Ambulatory Visit: Payer: Medicare Other

## 2019-09-18 ENCOUNTER — Inpatient Hospital Stay: Payer: Medicare Other | Attending: Hematology

## 2019-09-20 ENCOUNTER — Telehealth: Payer: Self-pay | Admitting: Nurse Practitioner

## 2019-09-20 NOTE — Telephone Encounter (Signed)
Scheduled appt per 5/10 sch message - spoke with patient and he is aware of appt scheduled on 5/19

## 2019-09-27 ENCOUNTER — Inpatient Hospital Stay: Payer: Medicare Other

## 2019-09-27 ENCOUNTER — Telehealth: Payer: Self-pay | Admitting: Hematology

## 2019-09-27 ENCOUNTER — Inpatient Hospital Stay: Payer: Medicare Other | Admitting: Nurse Practitioner

## 2019-09-27 ENCOUNTER — Telehealth: Payer: Self-pay

## 2019-09-27 NOTE — Telephone Encounter (Signed)
PROGRESS NOTE: Per Cira Rue NP sent schedule message to get patient rescheduled for missed appointment toady.

## 2019-09-27 NOTE — Telephone Encounter (Signed)
Rescheduled appt per 5/19 sch message - pt sister is aware of appt date and time

## 2019-10-02 ENCOUNTER — Other Ambulatory Visit (HOSPITAL_COMMUNITY)
Admission: RE | Admit: 2019-10-02 | Discharge: 2019-10-02 | Disposition: A | Discharge status: Home / Self Care | Payer: Medicare Other | Source: Ambulatory Visit | Attending: Internal Medicine | Admitting: Internal Medicine

## 2019-10-03 ENCOUNTER — Encounter (INDEPENDENT_AMBULATORY_CARE_PROVIDER_SITE_OTHER): Payer: Self-pay | Admitting: *Deleted

## 2019-10-04 ENCOUNTER — Ambulatory Visit: Payer: Medicare Other

## 2019-10-04 ENCOUNTER — Ambulatory Visit: Payer: Medicare Other | Admitting: Nurse Practitioner

## 2019-10-04 ENCOUNTER — Other Ambulatory Visit: Payer: Medicare Other

## 2019-10-06 ENCOUNTER — Inpatient Hospital Stay: Payer: Medicare Other

## 2019-10-06 ENCOUNTER — Inpatient Hospital Stay: Payer: Medicare Other | Admitting: Hematology

## 2019-10-06 ENCOUNTER — Telehealth: Payer: Self-pay | Admitting: Hematology

## 2019-10-06 NOTE — Telephone Encounter (Signed)
Scheduled appt per 5/28 sch message- called and pt girlfriend took down message

## 2019-10-10 NOTE — Progress Notes (Signed)
[facility]   [phone] [phone]   Clinic Follow up Note   Patient Care Team: Wannetta Sender, FNP as PCP - General (Family Medicine) Truitt Merle, MD as Consulting Physician (Hematology) Alla Feeling, NP as Nurse Practitioner (Nurse Practitioner) Stark Klein, MD as Consulting Physician (General Surgery) Rogene Houston, MD as Consulting Physician (Gastroenterology) Milus Banister, MD as Attending Physician (Gastroenterology) 10/11/2019  CHIEF COMPLAINT: F/u history of gastric cancer, IDA  SUMMARY OF ONCOLOGIC HISTORY: Oncology History Overview Note  Cancer Staging Malignant neoplasm of body of stomach ([place]) Staging form: Stomach, AJCC 8th Edition - Clinical stage from 03/11/2017: Stage IIB (cT3, cN0, cM0) - Unsigned - Pathologic stage from 04/22/2017: Stage IIIB (pT4a, pN3a, cM0) - Signed by Alla Feeling, NP on 05/17/2017     Malignant neoplasm of body of stomach ([place])  02/17/2017 Initial Diagnosis   Malignant neoplasm of body of stomach ([place])   02/17/2017 Pathology Results   Diagnosis Stomach, biopsy, gastric ulcer - ADENOCARCINOMA. Microscopic Comment Several of the fragments are involved by moderately differentiated adenocarcinoma.   02/17/2017 Procedure   UPPER ENDOSCOPY  FINDINGS - Normal esophagus. - Z-line irregular, 44 cm from the incisors. - Red blood in the gastric body and in the gastric antrum. - Large gastric ulcer. Biopsied. - Erythematous mucosa in the antrum. - Normal cardia, gastric fundus, gastric body and pylorus. - Normal duodenal bulb and second portion of the duodenum. Comment: Endoscopic appearence concerning for malignant ulcer.   03/11/2017 Procedure   UPPER EUS PER DR. Ardis Hughs Findings: 1. The esophagus was normal. 2. There was a 2-3cm ulcerated, malignant mass along the greater curvature of the stomach, approximately mid-body. The mass was non-circumferential. 3. The duodenum was  normal. Endosonographic Finding 1. The gastric mass above correlated with a hypoechoic and heterogenous non-circumferential mass that measured 2.9cm across, 33m deep. The endosonographic borders were poorly defined and there was clear sonographic evidence suggesting invasion into and through the muscularis propria layer without evident invasion into nearby organs (uT3). 2. The duodenal lymphnode described on recent CT scan appeared reactive by [place]. 3. No perigastric adenopathy (uN0) 4. Limited views of the liver, spleen, pancreas, bile duct, gallbladder were all normal. - Along the greater curvature of the stomach, approximately mid-body, there is a 2.9cm uT3N0 (clinical stage IIB) gastric adenocarcinoma.   04/06/2017 Imaging   CT CHEST IMPRESSION: 1. No acute cardiopulmonary abnormalities. 2. Two small pulmonary nodules are noted measuring up to 5 mm. Nonspecific but warrant attention on follow-up imaging follow up 3. Nonspecific hyperdense, possibly enhancing lesion along the dome of liver is identified. In a patient that is at increased risk for more definitive assessment of this structure with contrast enhanced MRI of the liver is advised.   04/21/2017 Imaging   MR ABDOMEN IMPRESSION: 1. Enhancing 2.9 cm mass along the lesser curvature in the gastric antrum, compatible with known gastric malignancy . 2. Mildly enlarged gastrohepatic ligament lymph node, cannot exclude nodal metastasis. 3. No definite liver metastatic disease. Hyperenhancing 0.9 cm liver dome mass remains inconclusive, although the MRI features are most suggestive of a flash filling hemangioma, and the mass has been stable for nearly 2 months. Additional subcentimeter focus of hyperenhancement in the lateral segment left liver lobe is most likely a benign transient vascular phenomenon. Recommend attention to these lesions on a follow-up MRI abdomen without and with IV contrast in 3-6 months. This  recommendation follows ACR consensus guidelines: Management of Incidental Liver  Lesions on CT: A White Paper of the ACR Incidental Findings Committee. J Am Coll Radiol 2017; 37:8588-5027. 4. Benign right adrenal adenoma.     04/22/2017 Pathology Results   Diagnosis 1. Liver, biopsy - BILE DUCT HAMARTOMA. - THERE IS NO EVIDENCE OF MALIGNANCY. 2. Lymph nodes, regional resection, portal - METASTATIC CARCINOMA IN 2 OF 6 LYMPH NODES (2/6). 3. Stomach, resection for tumor, distal - INVASIVE ADENOCARCINOMA, POORLY DIFFERENTIATED, SPANNING 3.8 CM. - PERINEURAL INVASION IS IDENTIFIED. - ADENOCARCINOMA INVOLVES THE SEROSA. - METASTATIC CARCINOMA IN 12 OF 30 LYMPH NODES (12/30), WITH EXTRACAPSULAR EXTENSION. - SEE ONCOLOGY TABLE BELOW. 4. Lymph node, biopsy, common hepatic artery - THERE IS NO EVIDENCE OF CARCINOMA IN 1 OF 1 LYMPH NODE (0/1). Microscopic Comment 3. STOMACH: Specimen: Stomach. Procedure: Partial gastrectomy. Tumor Site: Greater curvature. Tumor Size: 3.8 cm Histologic Type: Adenocarcinoma. Histologic Grade: G3: poorly differentiated. Microscopic Extent of Tumor: Adenocarcinoma involves the serosa. Margins (select all that apply): Adenoca Proximal Margin: Negative for adenocarcinoma. Distal Margin: Negative for adenocarcinoma. Treatment Effect: N/A Lymph-Vascular Invasion: Not identified. Perineural Invasion: Present. Additional findings: Chronic gastritis. Ancillary testing: Can be performed upon clinician request. 1 of 3 Duplicate copy FINAL for GARVEY, [place] ([id number]) Microscopic Comment(continued) Lymph nodes: number examined - 37; number positive: 14 Pathologic Staging: pT4a, pN3a (JBK:gt, 04/27/17)   06/02/2017 - 09/30/2017 Adjuvant Chemotherapy   FOLFOX every 2 weeks, oxaliplatin stopped in March 2019 due to neuropathy, chemo stopped on 09/30/2017 per pt's request    10/29/2017 Imaging   10/29/2017 CT CAP  IMPRESSION: 1. Status post distal  gastrectomy. No evidence for metastatic disease in the chest, abdomen, or pelvis. 2. Stable tiny right pulmonary nodules. Continued attention on follow-up recommended. 3. 9 mm hypervascular lesion in the dome of the liver is stable over multiple studies back to 04/05/2017. Continued attention on follow-up suggested. 4. Ascending thoracic aorta measures 4.2 cm diameter. Recommend annual imaging followup by CTA or MRA. This recommendation follows 2010 ACCF/AHA/AATS/ACR/ASA/SCA/SCAI/SIR/STS/SVM Guidelines for the Diagnosis and Management of Patients with Thoracic Aortic Disease. Circulation. 2010; 121: E720-N470 . 5. Marked prostatomegaly. 6.  Aortic Atherosclerois (ICD10-170.0)   05/09/2018 Imaging   05/09/2018 CT CAP IMPRESSION: 1. No definite findings to suggest metastatic disease to the chest, abdomen or pelvis. 2. Multiple small pulmonary nodules scattered throughout the lungs bilaterally measuring 5 mm or less in size, stable compared to prior studies from 2018, favored to be benign. Continued attention on future follow-up examinations is recommended. 3. Aortic atherosclerosis with ectasia of the ascending thoracic aorta (4 cm in diameter). Recommend annual imaging followup by CTA or MRA. This recommendation follows 2010 ACCF/AHA/AATS/ACR/ASA/SCA/SCAI/SIR/STS/SVM Guidelines for the Diagnosis and Management of Patients with Thoracic Aortic Disease. Circulation. 2010; 121: J628-Z662. 4. Multiple small calculi lying dependently in the right-side of the urinary bladder. 5. Severe prostatomegaly. 6. Additional incidental findings, as above.   04/04/2019 Imaging   CT AP IMPRESSION: 1. Redemonstrated postoperative findings of distal gastrectomy and gastrojejunostomy (series 2, image 18, series 4, image 27). No evidence of malignant recurrence or metastatic disease in the abdomen or pelvis. 2. Subcutaneous body fat is substantially diminished in comparison to prior examination,  in keeping with reported history of weight loss. 3. Gross prostatomegaly and urinary bladder wall thickening, likely due to chronic outlet obstruction. 4.  Aortic Atherosclerosis (ICD10-I70.0).   08/16/2019 Imaging   CT CAP w contrast IMPRESSION: 1. No acute findings within the chest, abdomen or pelvis. No specific findings identified to suggest residual or recurrent tumor or metastatic  disease. 2. Small nonspecific pulmonary nodules are unchanged. 3. Marked enlargement of the prostate gland. 4. Multiple tiny bladder stones. 5. Aortic atherosclerosis. Aortic Atherosclerosis (ICD10-I70.0).     CURRENT THERAPY: IV Feraheme PRN; cancer surveillance   INTERVAL HISTORY: Mr. Cho presents for rescheduled f/u. He was last seen 08/21/19. He received IV Feraheme on 3/30 and 4/12. He has less energy since last visit. He tires easily with exertion. Continues working. He has abdominal pain after eating fast since surgery, improves if he eats slower. Denies n/v/c/d or change in bowel habits. Denies bloody or black stool or any bleeding. Not taking oral iron "because it made me bleed." Has occasional dizziness. Denies cough, chest pain, palpitations, headache, fever, chills. He has tingling in right arm after second covid19 vaccine.    MEDICAL HISTORY:  Past Medical History:  Diagnosis Date  . Arthritis   . Cancer [facility]) dx oct 02-2017   stomach  . Gout   . Heart murmur   . Hyperlipidemia   . Hypertension   . Stomach cancer [facility])     SURGICAL HISTORY: Past Surgical History:  Procedure Laterality Date  . COLONOSCOPY    . ESOPHAGOGASTRODUODENOSCOPY N/A 02/17/2017   Procedure: ESOPHAGOGASTRODUODENOSCOPY (EGD);  Surgeon: Rogene Houston, MD;  [facility];  Service: Endoscopy;  Laterality: N/A;  3:00  . EUS N/A 03/11/2017   Procedure: UPPER ENDOSCOPIC ULTRASOUND (EUS) LINEAR;  Surgeon: Milus Banister, MD;  Location: [facility];  Service: Endoscopy;  Laterality: N/A;  . EUS N/A  03/11/2017   Procedure: UPPER ENDOSCOPIC ULTRASOUND (EUS) RADIAL;  Surgeon: Milus Banister, MD;  Location: [facility];  Service: Endoscopy;  Laterality: N/A;  . FINGER SURGERY     Lt middle   . GASTRECTOMY N/A 04/22/2017   Procedure: PARTIAL GASTRECTOMY;  Surgeon: Stark Klein, MD;  Location: [place];  Service: General;  Laterality: N/A;  . GASTROJEJUNOSTOMY N/A 04/22/2017   Procedure: JEJUNAL FEEDING TUBE PLACEMENT;  Surgeon: Stark Klein, MD;  Location: [place];  Service: General;  Laterality: N/A;  . HYDROCELE EXCISION  02/12/2011   Procedure: HYDROCELECTOMY ADULT;  Surgeon: Marissa Nestle;  Location: [facility];  Service: Urology;  Laterality: Left;  . LAPAROSCOPY N/A 04/22/2017   Procedure: LAPAROSCOPY DIAGNOSTIC ERAS PATHWAY;  Surgeon: Stark Klein, MD;  Location: [place];  Service: General;  Laterality: N/A;  EPIDURAL  . PORTACATH PLACEMENT N/A 05/28/2017   Procedure: INSERTION PORT-A-CATH ERAS PATHWAY;  Surgeon: Stark Klein, MD;  Location: [place];  Service: General;  Laterality: N/A;    I have reviewed the social history and family history with the patient and they are unchanged from previous note.  ALLERGIES:  is allergic to penicillins.  MEDICATIONS:  Current Outpatient Medications  Medication Sig Dispense Refill  . amLODipine (NORVASC) 10 MG tablet Take 1 tablet (10 mg total) by mouth daily. 90 tablet 0  . aspirin EC 81 MG tablet Take 81 mg by mouth daily.    Marland Kitchen atorvastatin (LIPITOR) 40 MG tablet Take 1 tablet (40 mg total) by mouth daily at 6 PM. 90 tablet 0  . Cholecalciferol (VITAMIN D3) 5000 units CAPS Take 5,000 Units by mouth daily.    Marland Kitchen gabapentin (NEURONTIN) 300 MG capsule Take 300 mg by mouth daily.    . metoprolol tartrate (LOPRESSOR) 50 MG tablet Take 50 mg by mouth 2 (two) times daily.    . potassium chloride SA (K-DUR,KLOR-CON) 20 MEQ tablet Take 1 tablet (20 mEq total) by mouth 2 (two)  times daily. (Patient taking differently: Take 20 mEq by  mouth daily. ) 40 tablet 1  . triamterene-hydrochlorothiazide (MAXZIDE-25) 37.5-25 MG tablet Take 1 tablet by mouth daily.     Current Facility-Administered Medications  Medication Dose Route Frequency Provider Last Rate Last Admin  . sodium chloride flush (NS) 0.9 % injection 10 mL  10 mL Intravenous PRN Truitt Merle, MD   10 mL at 10/11/19 1355    PHYSICAL EXAMINATION: ECOG PERFORMANCE STATUS: 1 - Symptomatic but completely ambulatory  Vitals:   10/11/19 1351  BP: 128/68  Pulse: 60  Resp: 18  Temp: 97.7 F (36.5 C)  SpO2: 100%   Filed Weights   10/11/19 1351  Weight: 136 lb 4.8 oz (61.8 kg)    GENERAL:alert, no distress and comfortable SKIN: no rash  EYES: sclera clear LYMPH:  no palpable cervical or supraclavicular lymphadenopathy  LUNGS: clear with normal breathing effort HEART: regular rate & rhythm, no lower extremity edema ABDOMEN: abdomen soft, non-tender and normal bowel sounds NEURO: alert & oriented x 3 with fluent speech, normal gait PAC without erythema   LABORATORY DATA:  I have reviewed the data as listed CBC Latest Ref Rng & Units 10/11/2019 08/21/2019 07/28/2019  WBC 4.0 - 10.5 K/uL 6.9 7.0 7.4  Hemoglobin 13.0 - 17.0 g/dL 7.6(L) 8.8(L) 6.7(LL)  Hematocrit 39.0 - 52.0 % 26.2(L) 31.0(L) 23.5(L)  Platelets 150 - 400 K/uL 349 280 395     CMP Latest Ref Rng & Units 10/11/2019 08/21/2019 07/28/2019  Glucose 70 - 99 mg/dL 77 81 114(H)  BUN 8 - 23 mg/dL '13 12 16  '$ Creatinine 0.61 - 1.24 mg/dL 1.03 0.98 1.18  Sodium 135 - 145 mmol/L 141 141 141  Potassium 3.5 - 5.1 mmol/L 4.0 3.9 4.1  Chloride 98 - 111 mmol/L 109 108 110  CO2 22 - 32 mmol/L '24 27 24  '$ Calcium 8.9 - 10.3 mg/dL 8.8(L) 8.9 8.8(L)  Total Protein 6.5 - 8.1 g/dL 5.7(L) 5.8(L) 5.8(L)  Total Bilirubin 0.3 - 1.2 mg/dL 0.3 0.2(L) 0.2(L)  Alkaline Phos 38 - 126 U/L 105 94 96  AST 15 - 41 U/L '16 17 15  '$ ALT 0 - 44 U/L '12 11 9      '$ RADIOGRAPHIC STUDIES: I have personally reviewed the radiological images  as listed and agreed with the findings in the report. No results found.   ASSESSMENT & PLAN: Nichalas Coin Daltonis a 74 y.o.malewith   1. Recurrent iron deficiency anemia  -he initially had low serum iron and low transferrin saturation in 06/2017 after cancer resection; iron and Ferritin were normal in the interval.  -he developed worsening anemia in 03/2019, Hg 10.2 on 11/19, ferritin 12, serum iron 21 and 6% transferrin saturation, consistent with iron deficiency  -He received RBC transfusion on 3/20 when Hgb dropped to 6.7 and he became symptomatic with dizziness, fatigue, nausea. Symptoms resolved after transfusion.  -he does not tolerate oral iron. He received IV Ferhema on 08/08/19 and 08/21/19. His Hgb improved slightly to 8.8 and ferritin normalized to 122 for a short time but he has not been able to maintain a durable response to IV iron.  -this is concerning for active GI bleeding. I recommend to hold aspirin until GI work up is done.  -GI work up per Dr. Laural Golden is pending, scheduled on 11/02/19, will f/u on the report and see him back with repeat labs next month  2. Primary adenocarcinoma of the pyloric antrum, pT4a, PN3aM0, stage IIIB, Grade3, MSI-stable -Diagnosed in  02/2017.S/psurgical resection and adjuvant chemotherapyFOLFOX. Patient was not compliant with chemo, oxaliplatin stopped after 4 cycles, and 5-FU stopped after 8 cycles treatment per his request. -His last colonoscopy was in 2015, previously followed by Dr. Paulita Fujita. Scheduled on 6/24 with Dr. Laural Golden - surveillance CTs have been negative for recurrence or metastatic disease, last done 08/16/19  -Will continue gastric cancer surveillance.   3. Weight loss -likely related to prior surgery -previously referred to dietician -continue supplements as needed  -Stable over last 6 months   4.Ascendingaortic aneurysm -stable at 4 cm per CT CAP on 08/16/19  5. Smokingcessation -I again strongly encouraged him to quit  completely, he agrees to try   6. BPH and elevated PSA -severe prostatomegalynoted on multiple scans including CT CAP from 08/16/19 -has urologist in [place]  Disposition:  Mr. Hemstreet appears stable. He received IV iron on 3/30 and 08/21/19. He has worsening anemia today Hgb 7.6, he is symptomatic with fatigue and exertional dyspnea. Iron studies from today are low, ferritin 11. He does not sustain a response to IV iron for long. He will receive another IV Feraheme infusion today. He will return for 1 unit RBC transfusion on 10/14/19, and second Feraheme on 6/9. We will repeat CBC and iron studies on 6/21 with potential RBC/Feraheme on 11/01/19 prior to scheduled colonoscopy on 6/24. We will see him back in 6 weeks to review GI work up.   From a gastric cancer standpoint, he is clinically doing well. Last surveillance CT CAP in 08/16/19 was negative, today's exam is benign, no clinical concern for recurrence. Continue surveillance.   All questions were answered. The patient knows to call the clinic with any problems, questions or concerns. No barriers to learning was detected.     Alla Feeling, NP 10/11/19

## 2019-10-11 ENCOUNTER — Inpatient Hospital Stay: Payer: Medicare Other | Attending: Hematology

## 2019-10-11 ENCOUNTER — Inpatient Hospital Stay: Payer: Medicare Other

## 2019-10-11 ENCOUNTER — Other Ambulatory Visit: Payer: Self-pay

## 2019-10-11 ENCOUNTER — Encounter: Payer: Self-pay | Admitting: Nurse Practitioner

## 2019-10-11 ENCOUNTER — Inpatient Hospital Stay (HOSPITAL_BASED_OUTPATIENT_CLINIC_OR_DEPARTMENT_OTHER): Payer: Medicare Other | Admitting: Nurse Practitioner

## 2019-10-11 VITALS — BP 128/68 | HR 60 | Temp 97.7°F | Resp 18 | Ht 70.0 in | Wt 136.3 lb

## 2019-10-11 DIAGNOSIS — N400 Enlarged prostate without lower urinary tract symptoms: Secondary | ICD-10-CM | POA: Insufficient documentation

## 2019-10-11 DIAGNOSIS — Z85028 Personal history of other malignant neoplasm of stomach: Secondary | ICD-10-CM | POA: Diagnosis present

## 2019-10-11 DIAGNOSIS — Z79899 Other long term (current) drug therapy: Secondary | ICD-10-CM | POA: Diagnosis not present

## 2019-10-11 DIAGNOSIS — D500 Iron deficiency anemia secondary to blood loss (chronic): Secondary | ICD-10-CM

## 2019-10-11 DIAGNOSIS — C162 Malignant neoplasm of body of stomach: Secondary | ICD-10-CM

## 2019-10-11 DIAGNOSIS — D649 Anemia, unspecified: Secondary | ICD-10-CM

## 2019-10-11 DIAGNOSIS — D509 Iron deficiency anemia, unspecified: Secondary | ICD-10-CM | POA: Insufficient documentation

## 2019-10-11 DIAGNOSIS — Z95828 Presence of other vascular implants and grafts: Secondary | ICD-10-CM

## 2019-10-11 DIAGNOSIS — R634 Abnormal weight loss: Secondary | ICD-10-CM | POA: Insufficient documentation

## 2019-10-11 LAB — CBC WITH DIFFERENTIAL (CANCER CENTER ONLY)
Abs Immature Granulocytes: 0.02 10*3/uL (ref 0.00–0.07)
Basophils Absolute: 0.1 10*3/uL (ref 0.0–0.1)
Basophils Relative: 1 %
Eosinophils Absolute: 0.2 10*3/uL (ref 0.0–0.5)
Eosinophils Relative: 3 %
HCT: 26.2 % — ABNORMAL LOW (ref 39.0–52.0)
Hemoglobin: 7.6 g/dL — ABNORMAL LOW (ref 13.0–17.0)
Immature Granulocytes: 0 %
Lymphocytes Relative: 19 %
Lymphs Abs: 1.3 10*3/uL (ref 0.7–4.0)
MCH: 22.8 pg — ABNORMAL LOW (ref 26.0–34.0)
MCHC: 29 g/dL — ABNORMAL LOW (ref 30.0–36.0)
MCV: 78.7 fL — ABNORMAL LOW (ref 80.0–100.0)
Monocytes Absolute: 0.4 10*3/uL (ref 0.1–1.0)
Monocytes Relative: 5 %
Neutro Abs: 5 10*3/uL (ref 1.7–7.7)
Neutrophils Relative %: 72 %
Platelet Count: 349 10*3/uL (ref 150–400)
RBC: 3.33 MIL/uL — ABNORMAL LOW (ref 4.22–5.81)
RDW: 17.5 % — ABNORMAL HIGH (ref 11.5–15.5)
WBC Count: 6.9 10*3/uL (ref 4.0–10.5)
nRBC: 0 % (ref 0.0–0.2)

## 2019-10-11 LAB — CMP (CANCER CENTER ONLY)
ALT: 12 U/L (ref 0–44)
AST: 16 U/L (ref 15–41)
Albumin: 3.1 g/dL — ABNORMAL LOW (ref 3.5–5.0)
Alkaline Phosphatase: 105 U/L (ref 38–126)
Anion gap: 8 (ref 5–15)
BUN: 13 mg/dL (ref 8–23)
CO2: 24 mmol/L (ref 22–32)
Calcium: 8.8 mg/dL — ABNORMAL LOW (ref 8.9–10.3)
Chloride: 109 mmol/L (ref 98–111)
Creatinine: 1.03 mg/dL (ref 0.61–1.24)
GFR, Est AFR Am: >60 mL/min (ref >60)
GFR, Estimated: >60 mL/min (ref >60)
Glucose, Bld: 77 mg/dL (ref 70–99)
Potassium: 4 mmol/L (ref 3.5–5.1)
Sodium: 141 mmol/L (ref 135–145)
Total Bilirubin: 0.3 mg/dL (ref 0.3–1.2)
Total Protein: 5.7 g/dL — ABNORMAL LOW (ref 6.5–8.1)

## 2019-10-11 LAB — IRON AND TIBC
Iron: 12 ug/dL — ABNORMAL LOW (ref 42–163)
Saturation Ratios: 4 % — ABNORMAL LOW (ref 20–55)
TIBC: 325 ug/dL (ref 202–409)
UIBC: 313 ug/dL (ref 117–376)

## 2019-10-11 LAB — FERRITIN: Ferritin: 11 ng/mL — ABNORMAL LOW (ref 24–336)

## 2019-10-11 MED ORDER — LORATADINE 10 MG PO TABS
10.0000 mg | ORAL_TABLET | Freq: Once | ORAL | Status: AC
  Administered 2019-10-11: 10 mg via ORAL

## 2019-10-11 MED ORDER — LORATADINE 10 MG PO TABS
ORAL_TABLET | ORAL | Status: AC
  Filled 2019-10-11: qty 1

## 2019-10-11 MED ORDER — HEPARIN SOD (PORK) LOCK FLUSH 100 UNIT/ML IV SOLN
500.0000 [IU] | Freq: Once | INTRAVENOUS | Status: AC | PRN
  Administered 2019-10-11: 500 [IU]
  Filled 2019-10-11: qty 5

## 2019-10-11 MED ORDER — SODIUM CHLORIDE 0.9% FLUSH
10.0000 mL | INTRAVENOUS | Status: DC | PRN
  Administered 2019-10-11: 10 mL via INTRAVENOUS
  Filled 2019-10-11: qty 10

## 2019-10-11 MED ORDER — SODIUM CHLORIDE 0.9% FLUSH
10.0000 mL | Freq: Once | INTRAVENOUS | Status: AC | PRN
  Administered 2019-10-11: 10 mL
  Filled 2019-10-11: qty 10

## 2019-10-11 MED ORDER — SODIUM CHLORIDE 0.9 % IV SOLN
510.0000 mg | Freq: Once | INTRAVENOUS | Status: AC
  Administered 2019-10-11: 510 mg via INTRAVENOUS
  Filled 2019-10-11: qty 510

## 2019-10-11 NOTE — Patient Instructions (Signed)

## 2019-10-11 NOTE — Addendum Note (Signed)
Addended by: Alla Feeling on: 10/11/2019 07:45 PM   Modules accepted: Orders, SmartSet

## 2019-10-11 NOTE — Progress Notes (Signed)
Pt declined to stay for 30 minute post-observation after feraheme

## 2019-10-13 ENCOUNTER — Other Ambulatory Visit: Payer: Self-pay

## 2019-10-13 ENCOUNTER — Telehealth: Payer: Self-pay | Admitting: Nurse Practitioner

## 2019-10-13 DIAGNOSIS — D500 Iron deficiency anemia secondary to blood loss (chronic): Secondary | ICD-10-CM

## 2019-10-13 NOTE — Telephone Encounter (Signed)
Scheduled appt per 6/2 los.  Left a vm of the appt date and time.

## 2019-10-14 ENCOUNTER — Inpatient Hospital Stay: Payer: Medicare Other

## 2019-10-18 ENCOUNTER — Other Ambulatory Visit: Payer: Self-pay

## 2019-10-18 ENCOUNTER — Inpatient Hospital Stay: Payer: Medicare Other

## 2019-10-18 VITALS — BP 159/71 | HR 48 | Temp 97.8°F | Resp 17

## 2019-10-18 DIAGNOSIS — Z95828 Presence of other vascular implants and grafts: Secondary | ICD-10-CM

## 2019-10-18 DIAGNOSIS — D509 Iron deficiency anemia, unspecified: Secondary | ICD-10-CM | POA: Diagnosis not present

## 2019-10-18 DIAGNOSIS — D500 Iron deficiency anemia secondary to blood loss (chronic): Secondary | ICD-10-CM

## 2019-10-18 DIAGNOSIS — D649 Anemia, unspecified: Secondary | ICD-10-CM

## 2019-10-18 DIAGNOSIS — C162 Malignant neoplasm of body of stomach: Secondary | ICD-10-CM

## 2019-10-18 LAB — CBC WITH DIFFERENTIAL (CANCER CENTER ONLY)
Abs Immature Granulocytes: 0.04 10*3/uL (ref 0.00–0.07)
Basophils Absolute: 0.1 10*3/uL (ref 0.0–0.1)
Basophils Relative: 1 %
Eosinophils Absolute: 0.1 10*3/uL (ref 0.0–0.5)
Eosinophils Relative: 1 %
HCT: 27.9 % — ABNORMAL LOW (ref 39.0–52.0)
Hemoglobin: 8 g/dL — ABNORMAL LOW (ref 13.0–17.0)
Immature Granulocytes: 1 %
Lymphocytes Relative: 12 %
Lymphs Abs: 0.9 10*3/uL (ref 0.7–4.0)
MCH: 24.2 pg — ABNORMAL LOW (ref 26.0–34.0)
MCHC: 28.7 g/dL — ABNORMAL LOW (ref 30.0–36.0)
MCV: 84.3 fL (ref 80.0–100.0)
Monocytes Absolute: 0.5 10*3/uL (ref 0.1–1.0)
Monocytes Relative: 7 %
Neutro Abs: 5.9 10*3/uL (ref 1.7–7.7)
Neutrophils Relative %: 78 %
Platelet Count: 322 10*3/uL (ref 150–400)
RBC: 3.31 MIL/uL — ABNORMAL LOW (ref 4.22–5.81)
RDW: 21.6 % — ABNORMAL HIGH (ref 11.5–15.5)
WBC Count: 7.6 10*3/uL (ref 4.0–10.5)
nRBC: 0 % (ref 0.0–0.2)

## 2019-10-18 LAB — CMP (CANCER CENTER ONLY)
ALT: 13 U/L (ref 0–44)
AST: 15 U/L (ref 15–41)
Albumin: 3 g/dL — ABNORMAL LOW (ref 3.5–5.0)
Alkaline Phosphatase: 92 U/L (ref 38–126)
Anion gap: 9 (ref 5–15)
BUN: 16 mg/dL (ref 8–23)
CO2: 24 mmol/L (ref 22–32)
Calcium: 8.8 mg/dL — ABNORMAL LOW (ref 8.9–10.3)
Chloride: 112 mmol/L — ABNORMAL HIGH (ref 98–111)
Creatinine: 0.92 mg/dL (ref 0.61–1.24)
GFR, Est AFR Am: >60 mL/min (ref >60)
GFR, Estimated: >60 mL/min (ref >60)
Glucose, Bld: 59 mg/dL — ABNORMAL LOW (ref 70–99)
Potassium: 3.8 mmol/L (ref 3.5–5.1)
Sodium: 145 mmol/L (ref 135–145)
Total Bilirubin: 0.3 mg/dL (ref 0.3–1.2)
Total Protein: 5.6 g/dL — ABNORMAL LOW (ref 6.5–8.1)

## 2019-10-18 LAB — PREPARE RBC (CROSSMATCH)

## 2019-10-18 MED ORDER — LORATADINE 10 MG PO TABS
ORAL_TABLET | ORAL | Status: AC
  Filled 2019-10-18: qty 1

## 2019-10-18 MED ORDER — SODIUM CHLORIDE 0.9% FLUSH
10.0000 mL | INTRAVENOUS | Status: DC | PRN
  Administered 2019-10-18: 10 mL via INTRAVENOUS
  Filled 2019-10-18: qty 10

## 2019-10-18 MED ORDER — HEPARIN SOD (PORK) LOCK FLUSH 100 UNIT/ML IV SOLN
500.0000 [IU] | Freq: Once | INTRAVENOUS | Status: DC | PRN
  Filled 2019-10-18: qty 5

## 2019-10-18 MED ORDER — SODIUM CHLORIDE 0.9% IV SOLUTION
250.0000 mL | Freq: Once | INTRAVENOUS | Status: DC
  Filled 2019-10-18: qty 250

## 2019-10-18 MED ORDER — LORATADINE 10 MG PO TABS
10.0000 mg | ORAL_TABLET | Freq: Once | ORAL | Status: AC
  Administered 2019-10-18: 10 mg via ORAL

## 2019-10-18 MED ORDER — ALTEPLASE 2 MG IJ SOLR
2.0000 mg | Freq: Once | INTRAMUSCULAR | Status: DC | PRN
  Filled 2019-10-18: qty 2

## 2019-10-18 MED ORDER — SODIUM CHLORIDE 0.9 % IV SOLN
510.0000 mg | Freq: Once | INTRAVENOUS | Status: AC
  Administered 2019-10-18: 510 mg via INTRAVENOUS
  Filled 2019-10-18: qty 510

## 2019-10-18 MED ORDER — SODIUM CHLORIDE 0.9 % IV SOLN
Freq: Once | INTRAVENOUS | Status: AC
  Filled 2019-10-18: qty 250

## 2019-10-18 MED ORDER — HEPARIN SOD (PORK) LOCK FLUSH 100 UNIT/ML IV SOLN
500.0000 [IU] | Freq: Once | INTRAVENOUS | Status: AC | PRN
  Administered 2019-10-18: 500 [IU] via INTRAVENOUS
  Filled 2019-10-18: qty 5

## 2019-10-18 NOTE — Patient Instructions (Signed)
Ferumoxytol injection What is this medicine? FERUMOXYTOL is an iron complex. Iron is used to make healthy red blood cells, which carry oxygen and nutrients throughout the body. This medicine is used to treat iron deficiency anemia. This medicine may be used for other purposes; ask your health care provider or pharmacist if you have questions. COMMON BRAND NAME(S): Feraheme What should I tell my health care provider before I take this medicine? They need to know if you have any of these conditions:  anemia not caused by low iron levels  high levels of iron in the blood  magnetic resonance imaging (MRI) test scheduled  an unusual or allergic reaction to iron, other medicines, foods, dyes, or preservatives  pregnant or trying to get pregnant  breast-feeding How should I use this medicine? This medicine is for injection into a vein. It is given by a health care professional in a hospital or clinic setting. Talk to your pediatrician regarding the use of this medicine in children. Special care may be needed. Overdosage: If you think you have taken too much of this medicine contact a poison control center or emergency room at once. NOTE: This medicine is only for you. Do not share this medicine with others. What if I miss a dose? It is important not to miss your dose. Call your doctor or health care professional if you are unable to keep an appointment. What may interact with this medicine? This medicine may interact with the following medications:  other iron products This list may not describe all possible interactions. Give your health care provider a list of all the medicines, herbs, non-prescription drugs, or dietary supplements you use. Also tell them if you smoke, drink alcohol, or use illegal drugs. Some items may interact with your medicine. What should I watch for while using this medicine? Visit your doctor or healthcare professional regularly. Tell your doctor or healthcare  professional if your symptoms do not start to get better or if they get worse. You may need blood work done while you are taking this medicine. You may need to follow a special diet. Talk to your doctor. Foods that contain iron include: whole grains/cereals, dried fruits, beans, or peas, leafy green vegetables, and organ meats (liver, kidney). What side effects may I notice from receiving this medicine? Side effects that you should report to your doctor or health care professional as soon as possible:  allergic reactions like skin rash, itching or hives, swelling of the face, lips, or tongue  breathing problems  changes in blood pressure  feeling faint or lightheaded, falls  fever or chills  flushing, sweating, or hot feelings  swelling of the ankles or feet Side effects that usually do not require medical attention (report to your doctor or health care professional if they continue or are bothersome):  diarrhea  headache  nausea, vomiting  stomach pain This list may not describe all possible side effects. Call your doctor for medical advice about side effects. You may report side effects to FDA at 1-800-FDA-1088. Where should I keep my medicine? This drug is given in a hospital or clinic and will not be stored at home. NOTE: This sheet is a summary. It may not cover all possible information. If you have questions about this medicine, talk to your doctor, pharmacist, or health care provider.  2020 Elsevier/Gold Standard (2016-06-15 20:21:10)   Blood Transfusion, Adult, Care After This sheet gives you information about how to care for yourself after your procedure. Your doctor may   also give you more specific instructions. If you have problems or questions, contact your doctor. What can I expect after the procedure? After the procedure, it is common to have:  Bruising and soreness at the IV site.  A fever or chills on the day of the procedure. This may be your body's response  to the new blood cells received.  A headache. Follow these instructions at home: Insertion site care      Follow instructions from your doctor about how to take care of your insertion site. This is where an IV tube was put into your vein. Make sure you: ? Wash your hands with soap and water before and after you change your bandage (dressing). If you cannot use soap and water, use hand sanitizer. ? Change your bandage as told by your doctor.  Check your insertion site every day for signs of infection. Check for: ? Redness, swelling, or pain. ? Bleeding from the site. ? Warmth. ? Pus or a bad smell. General instructions  Take over-the-counter and prescription medicines only as told by your doctor.  Rest as told by your doctor.  Go back to your normal activities as told by your doctor.  Keep all follow-up visits as told by your doctor. This is important. Contact a doctor if:  You have itching or red, swollen areas of skin (hives).  You feel worried or nervous (anxious).  You feel weak after doing your normal activities.  You have redness, swelling, warmth, or pain around the insertion site.  You have blood coming from the insertion site, and the blood does not stop with pressure.  You have pus or a bad smell coming from the insertion site. Get help right away if:  You have signs of a serious reaction. This may be coming from an allergy or the body's defense system (immune system). Signs include: ? Trouble breathing or shortness of breath. ? Swelling of the face or feeling warm (flushed). ? Fever or chills. ? Head, chest, or back pain. ? Dark pee (urine) or blood in the pee. ? Widespread rash. ? Fast heartbeat. ? Feeling dizzy or light-headed. You may receive your blood transfusion in an outpatient setting. If so, you will be told whom to contact to report any reactions. These symptoms may be an emergency. Do not wait to see if the symptoms will go away. Get medical  help right away. Call your local emergency services (911 in the [place] Do not drive yourself to the hospital. Summary  Bruising and soreness at the IV site are common.  Check your insertion site every day for signs of infection.  Rest as told by your doctor. Go back to your normal activities as told by your doctor.  Get help right away if you have signs of a serious reaction. This information is not intended to replace advice given to you by your health care provider. Make sure you discuss any questions you have with your health care provider. Document Revised: 10/20/2018 Document Reviewed: 10/20/2018 Elsevier Patient Education  2020 Elsevier Inc.  

## 2019-10-19 ENCOUNTER — Telehealth: Payer: Self-pay

## 2019-10-19 LAB — BPAM RBC
Blood Product Expiration Date: 202107092359
ISSUE DATE / TIME: 202106091004
Unit Type and Rh: 5100

## 2019-10-19 LAB — TYPE AND SCREEN
ABO/RH(D): O POS
Antibody Screen: NEGATIVE
Unit division: 0

## 2019-10-19 NOTE — Telephone Encounter (Signed)
Ms Gabriel Poole left vm stating that Mr Gabriel Poole had mutiple episodes of vomiting las night.  He received feraheme and RBC yesterday, 10/18/2019.  He felt normal this am and was able to go to work today.

## 2019-10-26 ENCOUNTER — Telehealth: Payer: Self-pay

## 2019-10-26 ENCOUNTER — Telehealth: Payer: Self-pay | Admitting: Hematology

## 2019-10-26 DIAGNOSIS — D630 Anemia in neoplastic disease: Secondary | ICD-10-CM

## 2019-10-26 DIAGNOSIS — D500 Iron deficiency anemia secondary to blood loss (chronic): Secondary | ICD-10-CM

## 2019-10-26 DIAGNOSIS — C162 Malignant neoplasm of body of stomach: Secondary | ICD-10-CM

## 2019-10-26 NOTE — Telephone Encounter (Signed)
MR Driver left vm regarding next weeks appts.  I returned his call.  He states he does not have transportation for his appts on 621 and 6/23 and is requesting they be rescheduled.  I explained the reasons for his appt and he still wants to reschedule.  Reviewed with Cira Rue, NP. High priority scheduling message sent.

## 2019-10-26 NOTE — Telephone Encounter (Signed)
R/s appt per 6/17 sch message - called pt . No answer. Left message with appt date and time

## 2019-10-27 ENCOUNTER — Other Ambulatory Visit (INDEPENDENT_AMBULATORY_CARE_PROVIDER_SITE_OTHER): Payer: Self-pay | Admitting: Internal Medicine

## 2019-10-27 DIAGNOSIS — D649 Anemia, unspecified: Secondary | ICD-10-CM

## 2019-10-30 ENCOUNTER — Inpatient Hospital Stay: Payer: Medicare Other

## 2019-10-31 ENCOUNTER — Other Ambulatory Visit (HOSPITAL_COMMUNITY)
Admission: RE | Admit: 2019-10-31 | Discharge: 2019-10-31 | Disposition: A | Discharge status: Home / Self Care | Payer: Medicare Other | Source: Ambulatory Visit | Attending: Internal Medicine | Admitting: Internal Medicine

## 2019-10-31 ENCOUNTER — Other Ambulatory Visit: Payer: Self-pay

## 2019-10-31 DIAGNOSIS — Z20822 Contact with and (suspected) exposure to covid-19: Secondary | ICD-10-CM | POA: Insufficient documentation

## 2019-10-31 DIAGNOSIS — Z01812 Encounter for preprocedural laboratory examination: Secondary | ICD-10-CM | POA: Insufficient documentation

## 2019-11-01 ENCOUNTER — Ambulatory Visit: Payer: Medicare Other

## 2019-11-01 ENCOUNTER — Other Ambulatory Visit (INDEPENDENT_AMBULATORY_CARE_PROVIDER_SITE_OTHER): Payer: Self-pay | Admitting: *Deleted

## 2019-11-01 DIAGNOSIS — Z86010 Personal history of colonic polyps: Secondary | ICD-10-CM

## 2019-11-01 DIAGNOSIS — D649 Anemia, unspecified: Secondary | ICD-10-CM

## 2019-11-01 DIAGNOSIS — C162 Malignant neoplasm of body of stomach: Secondary | ICD-10-CM

## 2019-11-01 LAB — SARS CORONAVIRUS 2 (TAT 6-24 HRS): SARS Coronavirus 2: NEGATIVE

## 2019-11-02 ENCOUNTER — Ambulatory Visit (HOSPITAL_COMMUNITY)
Admission: RE | Admit: 2019-11-02 | Discharge: 2019-11-02 | Disposition: A | Discharge status: Home / Self Care | Payer: Medicare Other | Attending: Internal Medicine | Admitting: Internal Medicine

## 2019-11-02 ENCOUNTER — Encounter (HOSPITAL_COMMUNITY): Payer: Self-pay | Admitting: Internal Medicine

## 2019-11-02 ENCOUNTER — Encounter (HOSPITAL_COMMUNITY)
Admission: RE | Disposition: A | Discharge status: Home / Self Care | Payer: Self-pay | Source: Home / Self Care | Attending: Internal Medicine

## 2019-11-02 DIAGNOSIS — I10 Essential (primary) hypertension: Secondary | ICD-10-CM | POA: Diagnosis not present

## 2019-11-02 DIAGNOSIS — D500 Iron deficiency anemia secondary to blood loss (chronic): Secondary | ICD-10-CM | POA: Insufficient documentation

## 2019-11-02 DIAGNOSIS — Z08 Encounter for follow-up examination after completed treatment for malignant neoplasm: Secondary | ICD-10-CM

## 2019-11-02 DIAGNOSIS — K648 Other hemorrhoids: Secondary | ICD-10-CM | POA: Insufficient documentation

## 2019-11-02 DIAGNOSIS — R1013 Epigastric pain: Secondary | ICD-10-CM | POA: Insufficient documentation

## 2019-11-02 DIAGNOSIS — Z7982 Long term (current) use of aspirin: Secondary | ICD-10-CM | POA: Diagnosis not present

## 2019-11-02 DIAGNOSIS — C162 Malignant neoplasm of body of stomach: Secondary | ICD-10-CM

## 2019-11-02 DIAGNOSIS — Z85028 Personal history of other malignant neoplasm of stomach: Secondary | ICD-10-CM | POA: Insufficient documentation

## 2019-11-02 DIAGNOSIS — Z8249 Family history of ischemic heart disease and other diseases of the circulatory system: Secondary | ICD-10-CM | POA: Diagnosis not present

## 2019-11-02 DIAGNOSIS — Z980 Intestinal bypass and anastomosis status: Secondary | ICD-10-CM

## 2019-11-02 DIAGNOSIS — K259 Gastric ulcer, unspecified as acute or chronic, without hemorrhage or perforation: Secondary | ICD-10-CM | POA: Insufficient documentation

## 2019-11-02 DIAGNOSIS — Z86010 Personal history of colonic polyps: Secondary | ICD-10-CM | POA: Insufficient documentation

## 2019-11-02 DIAGNOSIS — Z880 Allergy status to penicillin: Secondary | ICD-10-CM | POA: Insufficient documentation

## 2019-11-02 DIAGNOSIS — E785 Hyperlipidemia, unspecified: Secondary | ICD-10-CM | POA: Diagnosis not present

## 2019-11-02 DIAGNOSIS — D649 Anemia, unspecified: Secondary | ICD-10-CM

## 2019-11-02 DIAGNOSIS — Z79899 Other long term (current) drug therapy: Secondary | ICD-10-CM | POA: Insufficient documentation

## 2019-11-02 DIAGNOSIS — Z87891 Personal history of nicotine dependence: Secondary | ICD-10-CM | POA: Insufficient documentation

## 2019-11-02 DIAGNOSIS — D509 Iron deficiency anemia, unspecified: Secondary | ICD-10-CM

## 2019-11-02 DIAGNOSIS — M1990 Unspecified osteoarthritis, unspecified site: Secondary | ICD-10-CM | POA: Diagnosis not present

## 2019-11-02 DIAGNOSIS — K644 Residual hemorrhoidal skin tags: Secondary | ICD-10-CM | POA: Diagnosis not present

## 2019-11-02 DIAGNOSIS — K254 Chronic or unspecified gastric ulcer with hemorrhage: Secondary | ICD-10-CM | POA: Diagnosis not present

## 2019-11-02 DIAGNOSIS — C168 Malignant neoplasm of overlapping sites of stomach: Secondary | ICD-10-CM | POA: Insufficient documentation

## 2019-11-02 HISTORY — PX: ESOPHAGOGASTRODUODENOSCOPY: SHX5428

## 2019-11-02 HISTORY — PX: BIOPSY: SHX5522

## 2019-11-02 HISTORY — PX: COLONOSCOPY: SHX5424

## 2019-11-02 LAB — HEMOGLOBIN AND HEMATOCRIT, BLOOD
HCT: 38.2 % — ABNORMAL LOW (ref 39.0–52.0)
Hemoglobin: 11 g/dL — ABNORMAL LOW (ref 13.0–17.0)

## 2019-11-02 SURGERY — COLONOSCOPY
Anesthesia: Moderate Sedation

## 2019-11-02 MED ORDER — MEPERIDINE HCL 50 MG/ML IJ SOLN
INTRAMUSCULAR | Status: DC | PRN
  Administered 2019-11-02 (×2): 25 mg via INTRAVENOUS

## 2019-11-02 MED ORDER — MEPERIDINE HCL 50 MG/ML IJ SOLN
INTRAMUSCULAR | Status: AC
  Filled 2019-11-02: qty 1

## 2019-11-02 MED ORDER — MIDAZOLAM HCL 5 MG/5ML IJ SOLN
INTRAMUSCULAR | Status: DC | PRN
  Administered 2019-11-02 (×2): 2 mg via INTRAVENOUS

## 2019-11-02 MED ORDER — SODIUM CHLORIDE 0.9 % IV SOLN: INTRAVENOUS | Status: DC

## 2019-11-02 MED ORDER — STERILE WATER FOR IRRIGATION IR SOLN
Status: DC | PRN
  Administered 2019-11-02: 1.5 mL

## 2019-11-02 MED ORDER — LIDOCAINE VISCOUS HCL 2 % MT SOLN
OROMUCOSAL | Status: DC | PRN
  Administered 2019-11-02: 4 mL via OROMUCOSAL

## 2019-11-02 MED ORDER — LIDOCAINE VISCOUS HCL 2 % MT SOLN
OROMUCOSAL | Status: AC
  Filled 2019-11-02: qty 15

## 2019-11-02 MED ORDER — MIDAZOLAM HCL 5 MG/5ML IJ SOLN
INTRAMUSCULAR | Status: AC
  Filled 2019-11-02: qty 10

## 2019-11-02 NOTE — Op Note (Signed)
[facility] Patient Name: Gabriel Poole Procedure Date: 11/02/2019 10:46 AM MRN: [id number] Date of Birth: 27-Oct-1945 Attending MD: Hildred Laser , MD CSN: [id number] Age: 74 Admit Type: Outpatient Procedure:                Colonoscopy Indications:              Iron deficiency anemia secondary to chronic blood                            loss Providers:                Hildred Laser, MD, Gwenlyn Fudge RN, RN, Randa Spike, Technician Referring MD:             Cira Rue, NP/ Truitt Merle, MD Medicines:                None Complications:            No immediate complications. Estimated Blood Loss:     Estimated blood loss: none. Procedure:                Pre-Anesthesia Assessment:                           - Prior to the procedure, a History and Physical                            was performed, and patient medications and                            allergies were reviewed. The patient's tolerance of                            previous anesthesia was also reviewed. The risks                            and benefits of the procedure and the sedation                            options and risks were discussed with the patient.                            All questions were answered, and informed consent                            was obtained. Prior Anticoagulants: The patient has                            taken no previous anticoagulant or antiplatelet                            agents except for aspirin. ASA Grade Assessment: II                            -  A patient with mild systemic disease. After                            reviewing the risks and benefits, the patient was                            deemed in satisfactory condition to undergo the                            procedure.                           - Prior to the procedure, a History and Physical                            was performed, and patient medications and                             allergies were reviewed. The patient's tolerance of                            previous anesthesia was also reviewed. The risks                            and benefits of the procedure and the sedation                            options and risks were discussed with the patient.                            All questions were answered, and informed consent                            was obtained. ASA Grade Assessment: II - A patient                            with mild systemic disease. After reviewing the                            risks and benefits, the patient was deemed in                            satisfactory condition to undergo the procedure.                           After obtaining informed consent, the colonoscope                            was passed under direct vision. Throughout the                            procedure, the patient's blood pressure, pulse, and  oxygen saturations were monitored continuously. The                            [id number]-H190DL ([id number]) scope was introduced through                            the anus and advanced to the the cecum, identified                            by appendiceal orifice and ileocecal valve. The                            colonoscopy was performed without difficulty. The                            patient tolerated the procedure well. The quality                            of the bowel preparation was adequate. The                            ileocecal valve, appendiceal orifice, and rectum                            were photographed. Scope In: 10:51:26 AM Scope Out: 11:00:22 AM Scope Withdrawal Time: 0 hours 5 minutes 16 seconds  Total Procedure Duration: 0 hours 8 minutes 56 seconds  Findings:      Skin tags were found on perianal exam.      The colon (entire examined portion) appeared normal.      External and internal hemorrhoids were found during retroflexion. The       hemorrhoids were  small. Impression:               - Perianal skin tags found on perianal exam.                           - The entire examined colon is normal.                           - External and internal hemorrhoids.                           - No specimens collected. Moderate Sedation:      Moderate (conscious) sedation was administered by the endoscopy nurse       and supervised by the endoscopist. The following parameters were       monitored: oxygen saturation, heart rate, blood pressure, CO2       capnography and response to care. Total physician intraservice time was       14 minutes. Recommendation:           - Patient has a contact number available for                            emergencies. The signs and symptoms of potential  delayed complications were discussed with the                            patient. Return to normal activities tomorrow.                            Written discharge instructions were provided to the                            patient.                           - See the other procedure note for documentation of                            additional recommendations. Procedure Code(s):        --- Professional ---                           [phone], Colonoscopy, flexible; diagnostic, including                            collection of specimen(s) by brushing or washing,                            when performed (separate procedure)                           G0500, Moderate sedation services provided by the                            same physician or other qualified health care                            professional performing a gastrointestinal                            endoscopic service that sedation supports,                            requiring the presence of an independent trained                            observer to assist in the monitoring of the                            patient's level of consciousness and physiological                             status; initial 15 minutes of intra-service time;                            patient age 46 years or older (additional time 45  be reported with [phone], as appropriate) Diagnosis Code(s):        --- Professional ---                           K64.8, Other hemorrhoids                           K64.4, Residual hemorrhoidal skin tags                           D50.0, Iron deficiency anemia secondary to blood                            loss (chronic) CPT copyright 2019 American Medical Association. All rights reserved. The codes documented in this report are preliminary and upon coder review may  be revised to meet current compliance requirements. Hildred Laser, MD Hildred Laser, MD 11/02/2019 11:26:06 AM This report has been signed electronically. Number of Addenda: 0

## 2019-11-02 NOTE — Op Note (Signed)
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 Sharon Seller, RN, Randa Spike, Technician Referring MD:             Cira Rue, NP/ Truitt Merle, MD Medicines:                Lidocaine spray, Midazolam 4 mg IV Complications:            No immediate complications. Estimated Blood Loss:     Estimated blood loss was minimal. Procedure:                Pre-Anesthesia Assessment:                           - Prior to the procedure, a History and Physical                            was performed, and patient medications and                            allergies were reviewed. The patient's tolerance of                            previous anesthesia was also reviewed. The risks                            and benefits of the procedure and the sedation                            options and risks were discussed with the patient.                            All questions were answered, and informed consent                            was obtained. Prior Anticoagulants: The patient has                            taken no previous anticoagulant or antiplatelet                            agents except for aspirin. ASA Grade Assessment: II                            -  A patient with mild systemic disease. After                            reviewing the risks and benefits, the patient was                            deemed in satisfactory condition to undergo the                            procedure.                           After obtaining informed consent, the endoscope was                             passed under direct vision. Throughout the                            procedure, the patient's blood pressure, pulse, and                            oxygen saturations were monitored continuously. The                            GIF-H190 ([id number]) was introduced through the                            mouth, and advanced to the mid-jejunum. The upper                            GI endoscopy was accomplished without difficulty.                            The patient tolerated the procedure well. Scope In: 10:33:29 AM Scope Out: 10:46:37 AM Total Procedure Duration: 0 hours 13 minutes 8 seconds  Findings:      The hypopharynx was normal.      The examined esophagus was normal.      The Z-line was regular and was found 45 cm from the incisors.      Evidence of a patent Billroth II gastrojejunostomy was found. This was       traversed. The efferent limb was examined 20 cm from the anastomosis and       was characterized by healthy appearing mucosa. The afferent limb was       examined.      A large, fungating, polypoid, submucosal and ulcerated,       non-circumferential mass with oozing bleeding and stigmata of recent       bleeding was found at the anastomosis. Biopsies were taken with a cold       forceps for histology. The pathology specimen was placed into Bottle       Number 1.      One oozing cratered gastric ulcer was found in the gastric fundus. The       lesion was 20 mm in largest dimension. Biopsies were taken with a cold       forceps for histology. The pathology  specimen was placed into Bottle       Number 2. Impression:               - Normal hypopharynx.                           - Normal esophagus.                           - Z-line regular, 45 cm from the incisors.                           - Patent Billroth II gastrojejunostomy was found.                           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                            Biopsied.                            - Gastric ulcer with raised margins appears to be                            separate lesion. Biopsied.                           comment: recurrent tumor at anastamosis appears to                            be separate form fundal lesion.                           these lesions are the source of chronic blood loss. Moderate Sedation:      Moderate (conscious) sedation was administered by the endoscopy nurse       and supervised by the endoscopist. The following parameters were       monitored: oxygen saturation, heart rate, blood pressure, CO2       capnography and response to care. Total physician intraservice time was       19 minutes. Recommendation:           - Patient has a contact number available for                            emergencies. The signs and symptoms of potential                            delayed complications were discussed with the                            patient. Return to normal activities tomorrow.                            Written discharge instructions were provided to the  patient.                           - Chopped diet and mechanical soft diet today.                           - Continue present medications.                           - No aspirin, ibuprofen, naproxen, or other                            non-steroidal anti-inflammatory drugs.                           - Await pathology results.                           - Follow-up with Dr. Burr Medico. Procedure Code(s):        --- Professional ---                           [phone], Esophagogastroduodenoscopy, flexible,                            transoral; with biopsy, single or multiple                           G0500, Moderate sedation services provided by the                            same physician or other qualified health care                            professional performing a gastrointestinal                            endoscopic service that sedation supports,                             requiring the presence of an independent trained                            observer to assist in the monitoring of the                            patient's level of consciousness and physiological                            status; initial 15 minutes of intra-service time;                            patient age 61 years or older (additional time may                            be reported with [phone], as appropriate) Diagnosis  Code(s):        --- Professional ---                           Z98.0, Intestinal bypass and anastomosis status                           C16.8, Malignant neoplasm of overlapping sites of                            stomach                           K25.4, Chronic or unspecified gastric ulcer with                            hemorrhage                           D50.9, Iron deficiency anemia, unspecified CPT copyright 2019 American Medical Association. All rights reserved. The codes documented in this report are preliminary and upon coder review may  be revised to meet current compliance requirements. Hildred Laser, MD Hildred Laser, MD 11/02/2019 11:23:14 AM This report has been signed electronically. Number of Addenda: 0

## 2019-11-02 NOTE — Discharge Instructions (Signed)
No aspirin or NSAIDs. Resume other medications as before. Mechanical soft diet multiple small meals. No driving for 24 hours. Physician will call with biopsy results.       Upper Endoscopy, Adult, Care After This sheet gives you information about how to care for yourself after your procedure. Your health care provider may also give you more specific instructions. If you have problems or questions, contact your health care provider. What can I expect after the procedure? After the procedure, it is common to have:  A sore throat.  Mild stomach pain or discomfort.  Bloating.  Nausea. Follow these instructions at home:   Follow instructions from your health care provider about what to eat or drink after your procedure.  Return to your normal activities as told by your health care provider. Ask your health care provider what activities are safe for you.  Take over-the-counter and prescription medicines only as told by your health care provider.  Do not drive for 24 hours if you were given a sedative during your procedure.  Keep all follow-up visits as told by your health care provider. This is important. Contact a health care provider if you have:  A sore throat that lasts longer than one day.  Trouble swallowing. Get help right away if:  You vomit blood or your vomit looks like coffee grounds.  You have: ? A fever. ? Bloody, black, or tarry stools. ? A severe sore throat or you cannot swallow. ? Difficulty breathing. ? Severe pain in your chest or abdomen. Summary  After the procedure, it is common to have a sore throat, mild stomach discomfort, bloating, and nausea.  Do not drive for 24 hours if you were given a sedative during the procedure.  Follow instructions from your health care provider about what to eat or drink after your procedure.  Return to your normal activities as told by your health care provider. This information is not intended to replace  advice given to you by your health care provider. Make sure you discuss any questions you have with your health care provider. Document Revised: 10/19/2017 Document Reviewed: 09/27/2017 Elsevier Patient Education  [place].    Colonoscopy, Adult, Care After This sheet gives you information about how to care for yourself after your procedure. Your doctor may also give you more specific instructions. If you have problems or questions, call your doctor. What can I expect after the procedure? After the procedure, it is common to have:  A small amount of blood in your poop (stool) for 24 hours.  Some gas.  Mild cramping or bloating in your belly (abdomen). Follow these instructions at home: Eating and drinking   Drink enough fluid to keep your pee (urine) pale yellow.  Follow instructions from your doctor about what you cannot eat or drink.  Return to your normal diet as told by your doctor. Avoid heavy or fried foods that are hard to digest. Activity  Rest as told by your doctor.  Do not sit for a long time without moving. Get up to take short walks every 1-2 hours. This is important. Ask for help if you feel weak or unsteady.  Return to your normal activities as told by your doctor. Ask your doctor what activities are safe for you. To help cramping and bloating:   Try walking around.  Put heat on your belly as told by your doctor. Use the heat source that your doctor recommends, such as a moist heat pack or a  heating pad. ? Put a towel between your skin and the heat source. ? Leave the heat on for 20-30 minutes. ? Remove the heat if your skin turns bright red. This is very important if you are unable to feel pain, heat, or cold. You may have a greater risk of getting burned. General instructions  For the first 24 hours after the procedure: ? Do not drive or use machinery. ? Do not sign important documents. ? Do not drink alcohol. ? Do your daily activities more  slowly than normal. ? Eat foods that are soft and easy to digest.  Take over-the-counter or prescription medicines only as told by your doctor.  Keep all follow-up visits as told by your doctor. This is important. Contact a doctor if:  You have blood in your poop 2-3 days after the procedure. Get help right away if:  You have more than a small amount of blood in your poop.  You see large clumps of tissue (blood clots) in your poop.  Your belly is swollen.  You feel like you may vomit (nauseous).  You vomit.  You have a fever.  You have belly pain that gets worse, and medicine does not help your pain. Summary  After the procedure, it is common to have a small amount of blood in your poop. You may also have mild cramping and bloating in your belly.  For the first 24 hours after the procedure, do not drive or use machinery, do not sign important documents, and do not drink alcohol.  Get help right away if you have a lot of blood in your poop, feel like you may vomit, have a fever, or have more belly pain. This information is not intended to replace advice given to you by your health care provider. Make sure you discuss any questions you have with your health care provider. Document Revised: 11/21/2018 Document Reviewed: 11/21/2018 Elsevier Patient Education  [place] A soft-food eating plan includes foods that are safe and easy to chew and swallow. Your health care provider or dietitian can help you find foods and flavors that fit into this plan. Follow this plan until your health care provider or dietitian says it is safe to start eating other foods and food textures. What are tips for following this plan? General guidelines   Take small bites of food, or cut food into pieces about  inch or smaller. Bite-sized pieces of food are easier to chew and swallow.  Eat moist foods. Avoid overly dry foods.  Avoid foods that: ? Are difficult to  swallow, such as dry, chunky, crispy, or sticky foods. ? Are difficult to chew, such as hard, tough, or stringy foods. ? Contain nuts, seeds, or fruits.  Follow instructions from your dietitian about the types of liquids that are safe for you to swallow. You may be allowed to have: ? Thick liquids only. This includes only liquids that are thicker than honey. ? Thin and thick liquids. This includes all beverages and foods that become liquid at room temperature.  To make thick liquids: ? Purchase a commercial liquid thickening powder. These are available at grocery stores and pharmacies. ? Mix the thickener into liquids according to instructions on the label. ? Purchase ready-made thickened liquids. ? Thicken soup by pureeing, straining to remove chunks, and adding flour, potato flakes, or corn starch. ? Add commercial thickener to foods that become liquid at room temperature, such as milk shakes, yogurt,  ice cream, gelatin, and sherbet.  Ask your health care provider whether you need to take a fiber supplement. Cooking  Cook meats so they stay tender and moist. Use methods like braising, stewing, or baking in liquid.  Cook vegetables and fruit until they are soft enough to be mashed with a fork.  Peel soft, fresh fruits such as peaches, nectarines, and melons.  When making soup, make sure chunks of meat and vegetables are smaller than  inch.  Reheat leftover foods slowly so that a tough crust does not form. What foods are allowed? The items listed below may not be a complete list. Talk with your dietitian about what dietary choices are best for you. Grains Breads, muffins, pancakes, or waffles moistened with syrup, jelly, or butter. Dry cereals well-moistened with milk. Moist, cooked cereals. Well-cooked pasta and rice. Vegetables All soft-cooked vegetables. Shredded lettuce. Fruits All canned and cooked fruits. Soft, peeled fresh fruits. Strawberries. Dairy Milk. Cream. Yogurt.  Cottage cheese. Soft cheese without the rind. Meats and other protein foods Tender, moist ground meat, poultry, or fish. Meat cooked in gravy or sauces. Eggs. Sweets and desserts Ice cream. Milk shakes. Sherbet. Pudding. Fats and oils Butter. Margarine. Olive, canola, sunflower, and grapeseed oil. Smooth salad dressing. Smooth cream cheese. Mayonnaise. Gravy. What foods are not allowed? The items listed bemay not be a complete list. Talk with your dietitian about what dietary choices are best for you. Grains Coarse or dry cereals, such as bran, granola, and shredded wheat. Tough or chewy crusty breads, such as [place] bread or baguettes. Breads with nuts, seeds, or fruit. Vegetables All raw vegetables. Cooked corn. Cooked vegetables that are tough or stringy. Tough, crisp, fried potatoes and potato skins. Fruits Fresh fruits with skins or seeds, or both, such as apples, pears, and grapes. Stringy, high-pulp fruits, such as papaya, pineapple, coconut, and mango. Fruit leather and all dried fruit. Dairy Yogurt with nuts or coconut. Meats and other protein foods Hard, dry sausages. Dry meat, poultry, or fish. Meats with gristle. Fish with bones. Fried meat or fish. Lunch meat and hotdogs. Nuts and seeds. Chunky peanut butter or other nut butters. Sweets and desserts Cakes or cookies that are very dry or chewy. Desserts with dried fruit, nuts, or coconut. Fried pastries. Very rich pastries. Fats and oils Cream cheese with fruit or nuts. Salad dressings with seeds or chunks. Summary  A soft-food eating plan includes foods that are safe and easy to swallow. Generally, the foods should be soft enough to be mashed with a fork.  Avoid foods that are dry, hard to chew, crunchy, sticky, stringy, or crispy.  Ask your health care provider whether you need to thicken your liquids and if you need to take a fiber supplement. This information is not intended to replace advice given to you by your health  care provider. Make sure you discuss any questions you have with your health care provider. Document Revised: 08/18/2018 Document Reviewed: 06/30/2016 Elsevier Patient Education  [place].

## 2019-11-02 NOTE — H&P (Signed)
Gabriel Poole is an 74 y.o. male.   Chief Complaint: Patient is here for esophagogastroduodenoscopy followed by colonoscopy. 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  He underwent distal gastrectomy with Billroth II reconstruction.  He received adjuvant chemotherapy.  He has remained in remission.  He had chest and abdominal pelvic CT in April 2021 and there was no evidence of recurrent disease.  He has develop iron deficiency anemia.  He received a unit of PRBCs last week.  H&H from this morning is pending.  He complains of postprandial epigastric pain which may last for an hour.  He says he has good appetite.  He denies heartburn or dysphagia.  He states he has lost 5 pounds this year.  He denies melena or rectal bleeding or diarrhea.  Last colonoscopy was in September 2015 with removal of cecal polyp which was a tubular adenoma. Patient is on low-dose aspirin but does not take other NSAIDs. Family history is negative for gastric or colon carcinoma.  Past Medical History:  Diagnosis Date  . Arthritis   . Cancer [facility]) dx oct 02-2017   stomach  . Gout   . Heart murmur   . Hyperlipidemia   . Hypertension   . Stomach cancer [facility])     Past Surgical History:  Procedure Laterality Date  . COLONOSCOPY    . ESOPHAGOGASTRODUODENOSCOPY N/A 02/17/2017   Procedure: ESOPHAGOGASTRODUODENOSCOPY (EGD);  Surgeon: Rogene Houston, MD;  [facility];  Service: Endoscopy;  Laterality: N/A;  3:00  . EUS N/A 03/11/2017   Procedure: UPPER ENDOSCOPIC ULTRASOUND (EUS) LINEAR;  Surgeon: Milus Banister, MD;  Location: [facility];  Service: Endoscopy;  Laterality: N/A;  . EUS N/A 03/11/2017   Procedure: UPPER ENDOSCOPIC ULTRASOUND (EUS) RADIAL;  Surgeon: Milus Banister, MD;  Location: [facility];  Service: Endoscopy;  Laterality: N/A;  . FINGER SURGERY     Lt middle   . GASTRECTOMY N/A 04/22/2017   Procedure: PARTIAL  GASTRECTOMY;  Surgeon: Stark Klein, MD;  Location: [place];  Service: General;  Laterality: N/A;  . GASTROJEJUNOSTOMY N/A 04/22/2017   Procedure: JEJUNAL FEEDING TUBE PLACEMENT;  Surgeon: Stark Klein, MD;  Location: [place];  Service: General;  Laterality: N/A;  . HYDROCELE EXCISION  02/12/2011   Procedure: HYDROCELECTOMY ADULT;  Surgeon: Marissa Nestle;  Location: [facility];  Service: Urology;  Laterality: Left;  . LAPAROSCOPY N/A 04/22/2017   Procedure: LAPAROSCOPY DIAGNOSTIC ERAS PATHWAY;  Surgeon: Stark Klein, MD;  Location: [place];  Service: General;  Laterality: N/A;  EPIDURAL  . PORTACATH PLACEMENT N/A 05/28/2017   Procedure: INSERTION PORT-A-CATH ERAS PATHWAY;  Surgeon: Stark Klein, MD;  Location: [place];  Service: General;  Laterality: N/A;    Family History  Problem Relation Age of Onset  . Hypertension Mother   . Heart attack Father   . Cancer Maternal Aunt        leukemia  . Cancer Maternal Aunt        leukemia  . Hypotension Neg Hx   . Anesthesia problems Neg Hx   . Malignant hyperthermia Neg Hx   . Pseudochol deficiency Neg Hx    Social History:  reports that he quit smoking about 2 years ago. His smoking use included cigarettes and cigars. He has a 22.50 pack-year smoking history. He has never used smokeless tobacco. He reports current alcohol use. He reports that he does not use drugs.  Allergies:  Allergies  Allergen Reactions  . Penicillins Itching and Other (See Comments)     patient had a PCN reaction causing immediate rash, facial/tongue/throat swelling, SOB or lightheadedness with hypotension: Unknown Has patient had a PCN reaction causing severe rash involving mucus membranes or skin necrosis: Unknown Has patient had a PCN reaction that required hospitalization: Unknown Has patient had a PCN reaction occurring within the last 10 years: No If all of the above answers are "NO", then may proceed with Cephalosporin use.     Medications  Prior to Admission  Medication Sig Dispense Refill  . amLODipine (NORVASC) 10 MG tablet Take 1 tablet (10 mg total) by mouth daily. 90 tablet 0  . aspirin EC 81 MG tablet Take 81 mg by mouth daily.    Marland Kitchen atorvastatin (LIPITOR) 40 MG tablet Take 1 tablet (40 mg total) by mouth daily at 6 PM. 90 tablet 0  . Cholecalciferol (VITAMIN D3) 5000 units CAPS Take 5,000 Units by mouth daily.    Marland Kitchen gabapentin (NEURONTIN) 300 MG capsule Take 300 mg by mouth daily.    . potassium chloride SA (K-DUR,KLOR-CON) 20 MEQ tablet Take 1 tablet (20 mEq total) by mouth 2 (two) times daily. (Patient taking differently: Take 20 mEq by mouth daily. ) 40 tablet 1  . metoprolol tartrate (LOPRESSOR) 50 MG tablet Take 50 mg by mouth 2 (two) times daily.    Marland Kitchen triamterene-hydrochlorothiazide (MAXZIDE-25) 37.5-25 MG tablet Take 1 tablet by mouth daily.      No results found for this or any previous visit (from the past 48 hour(s)). No results found.  Review of Systems  Blood pressure (!) 164/75, pulse (!) 50, temperature 97.6 F (36.4 C), temperature source Oral, resp. rate 17, height 5\' 10"  (1.778 m), weight 62.6 kg, SpO2 100 %. Physical Exam  Constitutional:  Thin male in NAD.  HENT:  Mouth/Throat: Mucous membranes are moist. Oropharynx is clear.  Eyes: Conjunctivae are normal. No scleral icterus.  Cardiovascular: Normal rate, regular rhythm and normal heart sounds.  No murmur heard. Respiratory: Effort normal and breath sounds normal.  GI:  Abdomen is flat with upper midline scar along with small scar in left upper quadrant site of jejunal feeding tube.  Abdomen is soft and nontender with organomegaly or masses.  Musculoskeletal:        General: No swelling.     Cervical back: Neck supple. No rigidity.  Neurological: He is alert.  Skin: Skin is warm and dry.  Psychiatric: Mood normal.     Assessment/Plan History of gastric carcinoma. Postprandial epigastric pain. History of colonic adenoma. Iron deficiency  anemia. Diagnostic esophagogastroduodenoscopy and colonoscopy.  Hildred Laser, MD 11/02/2019, 10:19 AM

## 2019-11-05 NOTE — Progress Notes (Addendum)
[facility]   [phone] [phone]   Clinic Follow up Note   Patient Care Team: Wannetta Sender, FNP as PCP - General (Family Medicine) Truitt Merle, MD as Consulting Physician (Hematology) Alla Feeling, NP as Nurse Practitioner (Nurse Practitioner) Stark Klein, MD as Consulting Physician (General Surgery) Rogene Houston, MD as Consulting Physician (Gastroenterology) Milus Banister, MD as Attending Physician (Gastroenterology) 11/06/2019  CHIEF COMPLAINT: F/u history of gastric cancer, IDA  SUMMARY OF ONCOLOGIC HISTORY: Oncology History Overview Note  Cancer Staging Malignant neoplasm of body of stomach ([place]) Staging form: Stomach, AJCC 8th Edition - Clinical stage from 03/11/2017: Stage IIB (cT3, cN0, cM0) - Unsigned - Pathologic stage from 04/22/2017: Stage IIIB (pT4a, pN3a, cM0) - Signed by Alla Feeling, NP on 05/17/2017     Malignant neoplasm of body of stomach ([place])  02/17/2017 Initial Diagnosis   Malignant neoplasm of body of stomach ([place])   02/17/2017 Pathology Results   Diagnosis Stomach, biopsy, gastric ulcer - ADENOCARCINOMA. Microscopic Comment Several of the fragments are involved by moderately differentiated adenocarcinoma.   02/17/2017 Procedure   UPPER ENDOSCOPY  FINDINGS - Normal esophagus. - Z-line irregular, 44 cm from the incisors. - Red blood in the gastric body and in the gastric antrum. - Large gastric ulcer. Biopsied. - Erythematous mucosa in the antrum. - Normal cardia, gastric fundus, gastric body and pylorus. - Normal duodenal bulb and second portion of the duodenum. Comment: Endoscopic appearence concerning for malignant ulcer.   03/11/2017 Procedure   UPPER EUS PER DR. Ardis Hughs Findings: 1. The esophagus was normal. 2. There was a 2-3cm ulcerated, malignant mass along the greater curvature of the stomach, approximately mid-body. The mass was non-circumferential. 3. The duodenum was  normal. Endosonographic Finding 1. The gastric mass above correlated with a hypoechoic and heterogenous non-circumferential mass that measured 2.9cm across, 36m deep. The endosonographic borders were poorly defined and there was clear sonographic evidence suggesting invasion into and through the muscularis propria layer without evident invasion into nearby organs (uT3). 2. The duodenal lymphnode described on recent CT scan appeared reactive by [place]. 3. No perigastric adenopathy (uN0) 4. Limited views of the liver, spleen, pancreas, bile duct, gallbladder were all normal. - Along the greater curvature of the stomach, approximately mid-body, there is a 2.9cm uT3N0 (clinical stage IIB) gastric adenocarcinoma.   04/06/2017 Imaging   CT CHEST IMPRESSION: 1. No acute cardiopulmonary abnormalities. 2. Two small pulmonary nodules are noted measuring up to 5 mm. Nonspecific but warrant attention on follow-up imaging follow up 3. Nonspecific hyperdense, possibly enhancing lesion along the dome of liver is identified. In a patient that is at increased risk for more definitive assessment of this structure with contrast enhanced MRI of the liver is advised.   04/21/2017 Imaging   MR ABDOMEN IMPRESSION: 1. Enhancing 2.9 cm mass along the lesser curvature in the gastric antrum, compatible with known gastric malignancy . 2. Mildly enlarged gastrohepatic ligament lymph node, cannot exclude nodal metastasis. 3. No definite liver metastatic disease. Hyperenhancing 0.9 cm liver dome mass remains inconclusive, although the MRI features are most suggestive of a flash filling hemangioma, and the mass has been stable for nearly 2 months. Additional subcentimeter focus of hyperenhancement in the lateral segment left liver lobe is most likely a benign transient vascular phenomenon. Recommend attention to these lesions on a follow-up MRI abdomen without and with IV contrast in 3-6 months. This  recommendation follows ACR consensus guidelines: Management of Incidental Liver  Lesions on CT: A White Paper of the ACR Incidental Findings Committee. J Am Coll Radiol 2017; 37:8588-5027. 4. Benign right adrenal adenoma.     04/22/2017 Pathology Results   Diagnosis 1. Liver, biopsy - BILE DUCT HAMARTOMA. - THERE IS NO EVIDENCE OF MALIGNANCY. 2. Lymph nodes, regional resection, portal - METASTATIC CARCINOMA IN 2 OF 6 LYMPH NODES (2/6). 3. Stomach, resection for tumor, distal - INVASIVE ADENOCARCINOMA, POORLY DIFFERENTIATED, SPANNING 3.8 CM. - PERINEURAL INVASION IS IDENTIFIED. - ADENOCARCINOMA INVOLVES THE SEROSA. - METASTATIC CARCINOMA IN 12 OF 30 LYMPH NODES (12/30), WITH EXTRACAPSULAR EXTENSION. - SEE ONCOLOGY TABLE BELOW. 4. Lymph node, biopsy, common hepatic artery - THERE IS NO EVIDENCE OF CARCINOMA IN 1 OF 1 LYMPH NODE (0/1). Microscopic Comment 3. STOMACH: Specimen: Stomach. Procedure: Partial gastrectomy. Tumor Site: Greater curvature. Tumor Size: 3.8 cm Histologic Type: Adenocarcinoma. Histologic Grade: G3: poorly differentiated. Microscopic Extent of Tumor: Adenocarcinoma involves the serosa. Margins (select all that apply): Adenoca Proximal Margin: Negative for adenocarcinoma. Distal Margin: Negative for adenocarcinoma. Treatment Effect: N/A Lymph-Vascular Invasion: Not identified. Perineural Invasion: Present. Additional findings: Chronic gastritis. Ancillary testing: Can be performed upon clinician request. 1 of 3 Duplicate copy FINAL for GARVEY, [place] ([id number]) Microscopic Comment(continued) Lymph nodes: number examined - 37; number positive: 14 Pathologic Staging: pT4a, pN3a (JBK:gt, 04/27/17)   06/02/2017 - 09/30/2017 Adjuvant Chemotherapy   FOLFOX every 2 weeks, oxaliplatin stopped in March 2019 due to neuropathy, chemo stopped on 09/30/2017 per pt's request    10/29/2017 Imaging   10/29/2017 CT CAP  IMPRESSION: 1. Status post distal  gastrectomy. No evidence for metastatic disease in the chest, abdomen, or pelvis. 2. Stable tiny right pulmonary nodules. Continued attention on follow-up recommended. 3. 9 mm hypervascular lesion in the dome of the liver is stable over multiple studies back to 04/05/2017. Continued attention on follow-up suggested. 4. Ascending thoracic aorta measures 4.2 cm diameter. Recommend annual imaging followup by CTA or MRA. This recommendation follows 2010 ACCF/AHA/AATS/ACR/ASA/SCA/SCAI/SIR/STS/SVM Guidelines for the Diagnosis and Management of Patients with Thoracic Aortic Disease. Circulation. 2010; 121: E720-N470 . 5. Marked prostatomegaly. 6.  Aortic Atherosclerois (ICD10-170.0)   05/09/2018 Imaging   05/09/2018 CT CAP IMPRESSION: 1. No definite findings to suggest metastatic disease to the chest, abdomen or pelvis. 2. Multiple small pulmonary nodules scattered throughout the lungs bilaterally measuring 5 mm or less in size, stable compared to prior studies from 2018, favored to be benign. Continued attention on future follow-up examinations is recommended. 3. Aortic atherosclerosis with ectasia of the ascending thoracic aorta (4 cm in diameter). Recommend annual imaging followup by CTA or MRA. This recommendation follows 2010 ACCF/AHA/AATS/ACR/ASA/SCA/SCAI/SIR/STS/SVM Guidelines for the Diagnosis and Management of Patients with Thoracic Aortic Disease. Circulation. 2010; 121: J628-Z662. 4. Multiple small calculi lying dependently in the right-side of the urinary bladder. 5. Severe prostatomegaly. 6. Additional incidental findings, as above.   04/04/2019 Imaging   CT AP IMPRESSION: 1. Redemonstrated postoperative findings of distal gastrectomy and gastrojejunostomy (series 2, image 18, series 4, image 27). No evidence of malignant recurrence or metastatic disease in the abdomen or pelvis. 2. Subcutaneous body fat is substantially diminished in comparison to prior examination,  in keeping with reported history of weight loss. 3. Gross prostatomegaly and urinary bladder wall thickening, likely due to chronic outlet obstruction. 4.  Aortic Atherosclerosis (ICD10-I70.0).   08/16/2019 Imaging   CT CAP w contrast IMPRESSION: 1. No acute findings within the chest, abdomen or pelvis. No specific findings identified to suggest residual or recurrent tumor or metastatic  disease. 2. Small nonspecific pulmonary nodules are unchanged. 3. Marked enlargement of the prostate gland. 4. Multiple tiny bladder stones. 5. Aortic atherosclerosis. Aortic Atherosclerosis (ICD10-I70.0).   11/02/2019 Procedure   EGD impression - Normal hypopharynx. - Normal esophagus. - Z-line regular, 45 cm from the incisors. - Patent Billroth II gastrojejunostomy was found. 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 Biopsied. - Gastric ulcer with raised margins appears to be separate lesion. Biopsied.  comment: recurrent tumor at anastamosis appears to be separate form fundal lesion. these lesions are the source of chronic blood loss.  Colonoscopy impression: - Perianal skin tags found on perianal exam. - The entire examined colon is normal. - External and internal hemorrhoids. - No specimens collected.   11/02/2019 Pathology Results   FINAL MICROSCOPIC DIAGNOSIS:  A. STOMACH, BIOPSY:  -  Poorly differentiated carcinoma  -  See comment  B. STOMACH, FUNDUS, BIOPSY:  -  Adenocarcinoma  -  See comment  COMMENT:  A and B.  The carcinoma in part A is more poorly differentiated than  what is seen in part B but both are favored to be adenocarcinoma.  Dr.  Jeannie Done reviewed the case and agrees with the above diagnosis. Dr.  Laural Golden was notified of these results on November 03, 2019.      CURRENT THERAPY:  H/o gastric cancer: surveillance  IDA: IV Feraheme PRN  INTERVAL HISTORY: Mr. Plouff returns as scheduled. He has been supported with RBCs and iron transfusion for significant IDA recently.  He underwent colonoscopy by Dr. Laural Golden that showed a malignant appearing large, fungating, polypoid, submucosal and ulcerated, non-circumferential mass with bleeding at the anastomosis. There was a second lesion, a bleeding ulcer with raised margins in the gastric fundus. These were identified as the source of his bleeding. Path from both biopsies showed adenocarcinoma, the lesion at anastomosis showed to be more poorly differentiated than fundal lesion.    He presents today with ex-wife. He continues losing weight. He eats best at night and snacks occasionally. He threw up a hamburger few nights ago when he laid down soon after eating. No hematemesis. Otherwise he denies reflux. Bowels moving. Denies black or bloody stool. He takes baby aspirin for heart health. Denies fever, chills, cough, chest pain, dyspnea, leg edema. He continues smoking.    MEDICAL HISTORY:  Past Medical History:  Diagnosis Date  . Arthritis   . Cancer [facility]) dx oct 02-2017   stomach  . Gout   . Heart murmur   . Hyperlipidemia   . Hypertension   . Stomach cancer [facility])     SURGICAL HISTORY: Past Surgical History:  Procedure Laterality Date  . COLONOSCOPY    . ESOPHAGOGASTRODUODENOSCOPY N/A 02/17/2017   Procedure: ESOPHAGOGASTRODUODENOSCOPY (EGD);  Surgeon: Rogene Houston, MD;  [facility];  Service: Endoscopy;  Laterality: N/A;  3:00  . EUS N/A 03/11/2017   Procedure: UPPER ENDOSCOPIC ULTRASOUND (EUS) LINEAR;  Surgeon: Milus Banister, MD;  Location: [facility];  Service: Endoscopy;  Laterality: N/A;  . EUS N/A 03/11/2017   Procedure: UPPER ENDOSCOPIC ULTRASOUND (EUS) RADIAL;  Surgeon: Milus Banister, MD;  Location: [facility];  Service: Endoscopy;  Laterality: N/A;  . FINGER SURGERY     Lt middle   . GASTRECTOMY N/A 04/22/2017   Procedure: PARTIAL GASTRECTOMY;  Surgeon: Stark Klein, MD;  Location: [place];  Service: General;  Laterality: N/A;  . GASTROJEJUNOSTOMY N/A 04/22/2017   Procedure:  JEJUNAL FEEDING TUBE PLACEMENT;  Surgeon: Stark Klein, MD;  Location: [place];  Service: General;  Laterality: N/A;  . HYDROCELE EXCISION  02/12/2011   Procedure: HYDROCELECTOMY ADULT;  Surgeon: Marissa Nestle;  Location: [facility];  Service: Urology;  Laterality: Left;  . LAPAROSCOPY N/A 04/22/2017   Procedure: LAPAROSCOPY DIAGNOSTIC ERAS PATHWAY;  Surgeon: Stark Klein, MD;  Location: [place];  Service: General;  Laterality: N/A;  EPIDURAL  . PORTACATH PLACEMENT N/A 05/28/2017   Procedure: INSERTION PORT-A-CATH ERAS PATHWAY;  Surgeon: Stark Klein, MD;  Location: [place];  Service: General;  Laterality: N/A;    I have reviewed the social history and family history with the patient and they are unchanged from previous note.  ALLERGIES:  is allergic to penicillins.  MEDICATIONS:  Current Outpatient Medications  Medication Sig Dispense Refill  . amLODipine (NORVASC) 10 MG tablet Take 1 tablet (10 mg total) by mouth daily. 90 tablet 0  . atorvastatin (LIPITOR) 40 MG tablet Take 1 tablet (40 mg total) by mouth daily at 6 PM. 90 tablet 0  . Cholecalciferol (VITAMIN D3) 5000 units CAPS Take 5,000 Units by mouth daily.    . finasteride (PROSCAR) 5 MG tablet Take 5 mg by mouth daily.    Marland Kitchen gabapentin (NEURONTIN) 100 MG capsule Take 100-200 mg by mouth at bedtime.    . gabapentin (NEURONTIN) 300 MG capsule Take 300 mg by mouth daily.    Marland Kitchen HYDROcodone-acetaminophen (NORCO/VICODIN) 5-325 MG tablet SMARTSIG:1 Tablet(s) By Mouth Every 12 Hours    . potassium chloride SA (K-DUR,KLOR-CON) 20 MEQ tablet Take 1 tablet (20 mEq total) by mouth 2 (two) times daily. (Patient taking differently: Take 20 mEq by mouth daily. ) 40 tablet 1  . tadalafil (CIALIS) 5 MG tablet Take 5 mg by mouth daily.    Marland Kitchen triamterene-hydrochlorothiazide (MAXZIDE-25) 37.5-25 MG tablet Take 1 tablet by mouth daily.    . metoprolol tartrate (LOPRESSOR) 50 MG tablet Take 50 mg by mouth 2 (two) times daily. (Patient not  taking: Reported on 11/06/2019)     No current facility-administered medications for this visit.    PHYSICAL EXAMINATION: ECOG PERFORMANCE STATUS: 1 - Symptomatic but completely ambulatory  Vitals:   11/06/19 1046  BP: 130/73  Pulse: 67  Resp: 18  Temp: (!) 97 F (36.1 C)  SpO2: 100%   There were no vitals filed for this visit.  GENERAL:alert, no distress and comfortable SKIN: no rash  EYES: sclera clear NECK: without mass LUNGS: diminished, with normal breathing effort HEART: regular rate & rhythm, no lower extremity edema ABDOMEN: abdomen soft, non-tender and normal bowel sounds NEURO: alert & oriented x 3 with fluent speech PAC without erythema   LABORATORY DATA:  I have reviewed the data as listed CBC Latest Ref Rng & Units 11/06/2019 11/02/2019 10/18/2019  WBC 4.0 - 10.5 K/uL 6.4 - 7.6  Hemoglobin 13.0 - 17.0 g/dL 10.3(L) 11.0(L) 8.0(L)  Hematocrit 39 - 52 % 34.5(L) 38.2(L) 27.9(L)  Platelets 150 - 400 K/uL 232 - 322     CMP Latest Ref Rng & Units 11/06/2019 10/18/2019 10/11/2019  Glucose 70 - 99 mg/dL 108(H) 59(L) 77  BUN 8 - 23 mg/dL _0 Creatinine 0.61 - 1.24 mg/dL 1.07 0.92 1.03  Sodium 135 - 145 mmol/L 142 145 141  Potassium 3.5 - 5.1 mmol/L 3.5 3.8 4.0  Chloride 98 - 111 mmol/L 109 112(H) 109  CO2 22 - 32 mmol/L _1 Calcium 8.9 - 10.3 mg/dL 8.8(L) 8.8(L) 8.8(L)  Total Protein 6.5 - 8.1  g/dL 5.3(L) 5.6(L) 5.7(L)  Total Bilirubin 0.3 - 1.2 mg/dL 0.2(L) 0.3 0.3  Alkaline Phos 38 - 126 U/L 89 92 105  AST 15 - 41 U/L _0 ALT 0 - 44 U/L _1 RADIOGRAPHIC STUDIES: I have personally reviewed the radiological images as listed and agreed with the findings in the report. No results found.   ASSESSMENT & PLAN: Chavis Tessler Daltonis a 74y.o.malewith   1. Recurrent iron deficiency anemia  -he initially had low serum iron and low transferrin saturation in 06/2017 after cancer resection; iron and Ferritin were normal in the interval. -he  developed worsening anemia in 03/2019, Hg 10.2 on 11/19, ferritin 12, serum iron 21 and 6% transferrin saturation, consistent with iron deficiency -He received RBC transfusion on 3/20 when Hgb dropped to 6.7 and he became symptomatic with dizziness, fatigue, nausea. Symptoms resolved after transfusion. -he does not tolerate oral iron. He received IV Ferhema on 08/08/19 and 08/21/19. His Hgb improved slightly to 8.8 and ferritin normalized to 122 for a short time but he has not been able to maintain a durable response to IV iron.  -his work up per Dr. Laural Golden showed recurrent adenocarcinoma at the anastomosis and cratered ulcer in the gastric fundus, path showed adeno as well. This is the source of his bleeding/IDA.  -He is responding well to IV iron, will proceed with a dose today and monitor lab closely.   2. Primary adenocarcinoma of the pyloric antrum, pT4a, PN3aM0, stage IIIB, Grade3, MSI-stable, recurrence at the anastomosis and new foci in the gastric fundus in 10/2019  -Diagnosed in 02/2017.S/psurgical resection and adjuvant chemotherapyFOLFOX. Patient was not compliant with chemo, oxaliplatin stopped after 4 cycles, and 5-FU stopped after 8 cycles treatment per his request. -His last colonoscopy was in 2015, previously followedby Dr. Paulita Fujita. - surveillance CTs have been negative for recurrence or metastatic disease, last done 08/16/19  -he has been on surveillance. He developed weight loss and recurrent IDA recently. -I reviewed his work up by Dr. Laural Golden and path which shows recurrent poorly differentiated adenocarcionma at the anastomosis with additional focus of adeno in the gastric fundus -he agrees to discuss surgical options with Dr. Barry Dienes, he has been referred back to her.   3. Weight loss -likely related to prior surgery and now cancer recurrence -previously referred to dietician -continue supplements as needed -he has lost more weight recently. I recommend to start megace for  appetite   4.Ascendingaortic aneurysm -stable at 4 cm per CT CAP on 08/16/19  5. Smokingcessation -I again strongly encouraged him to quit completely, he agrees to try   6. BPH and elevated PSA -severe prostatomegalynoted on multiple scansincluding CT CAP from 08/16/19 -has urologistin [place]  Disposition:  Mr. Shawgo appears stable. He has started to develop post prandial vomiting with certain foods, he continues losing weight. I recommend to start Megace and I placed a dietician referral.   We discussed his EGD/colonoscopy and pathology, which shows local recurrent gastric adenocarcinoma at the anastomosis with an additional focus of adenocarcinoma in the gastric fundus. This is likely the case of his postprandial emesis and weight loss. We discussed referral to surgeon Dr. Barry Dienes, he agrees.   Plan to repeat staging before surgery.   Labs reviewed, Hgb improving, iron responding well to replacement. He will proceed with IV Feraheme today. No need for blood transfusion.   F/u in 2 weeks after discussion with Dr. Barry Dienes.   No problem-specific Assessment &  Plan notes found for this encounter.  All questions were answered. The patient knows to call the clinic with any problems, questions or concerns. No barriers to learning was detected.      Alla Feeling, NP 11/06/19   Addendum I have seen the patient, examined him. I agree with the assessment and and plan and have edited the notes.   I reviewed his recent EGD findings and the biopsy results.  He has local recurrence at the anastomosis and a new site of adenocarcinoma at fundus.  EUS has not been done, appears to be early stage on EGD.  His last CT scanning from April was negative for distant or nodal metastasis.  I discussed surgery (gastrectomy and possible distal esophagectomy), possible need for a feeding tube after surgery with patient and his wife in details.  He is open to have surgery, will refer him back to Dr.  Barry Dienes.  I discussed the role of adjuvant chemotherapy, depends on the surgical pathology findings.  We will discuss his case in GI tumor board next week to finalize his treatment plan.  He has recurrent moderate anemia and iron deficiency from gastric cancer, will continue monitor CBC closely with IV iron if needed.  Truitt Merle  11/06/2019

## 2019-11-06 ENCOUNTER — Inpatient Hospital Stay: Payer: Medicare Other

## 2019-11-06 ENCOUNTER — Inpatient Hospital Stay (HOSPITAL_BASED_OUTPATIENT_CLINIC_OR_DEPARTMENT_OTHER): Payer: Medicare Other | Admitting: Nurse Practitioner

## 2019-11-06 ENCOUNTER — Encounter: Payer: Self-pay | Admitting: Nurse Practitioner

## 2019-11-06 ENCOUNTER — Other Ambulatory Visit: Payer: Self-pay

## 2019-11-06 VITALS — BP 146/78 | HR 69

## 2019-11-06 VITALS — BP 130/73 | HR 67 | Temp 97.0°F | Resp 18 | Ht 70.0 in

## 2019-11-06 DIAGNOSIS — C162 Malignant neoplasm of body of stomach: Secondary | ICD-10-CM

## 2019-11-06 DIAGNOSIS — D500 Iron deficiency anemia secondary to blood loss (chronic): Secondary | ICD-10-CM

## 2019-11-06 DIAGNOSIS — D509 Iron deficiency anemia, unspecified: Secondary | ICD-10-CM | POA: Diagnosis not present

## 2019-11-06 DIAGNOSIS — D649 Anemia, unspecified: Secondary | ICD-10-CM

## 2019-11-06 DIAGNOSIS — D630 Anemia in neoplastic disease: Secondary | ICD-10-CM

## 2019-11-06 DIAGNOSIS — Z95828 Presence of other vascular implants and grafts: Secondary | ICD-10-CM

## 2019-11-06 LAB — CMP (CANCER CENTER ONLY)
ALT: 16 U/L (ref 0–44)
AST: 16 U/L (ref 15–41)
Albumin: 3 g/dL — ABNORMAL LOW (ref 3.5–5.0)
Alkaline Phosphatase: 89 U/L (ref 38–126)
Anion gap: 11 (ref 5–15)
BUN: 13 mg/dL (ref 8–23)
CO2: 22 mmol/L (ref 22–32)
Calcium: 8.8 mg/dL — ABNORMAL LOW (ref 8.9–10.3)
Chloride: 109 mmol/L (ref 98–111)
Creatinine: 1.07 mg/dL (ref 0.61–1.24)
GFR, Est AFR Am: >60 mL/min (ref >60)
GFR, Estimated: >60 mL/min (ref >60)
Glucose, Bld: 108 mg/dL — ABNORMAL HIGH (ref 70–99)
Potassium: 3.5 mmol/L (ref 3.5–5.1)
Sodium: 142 mmol/L (ref 135–145)
Total Bilirubin: 0.2 mg/dL — ABNORMAL LOW (ref 0.3–1.2)
Total Protein: 5.3 g/dL — ABNORMAL LOW (ref 6.5–8.1)

## 2019-11-06 LAB — SAMPLE TO BLOOD BANK

## 2019-11-06 LAB — CBC WITH DIFFERENTIAL (CANCER CENTER ONLY)
Abs Immature Granulocytes: 0.01 10*3/uL (ref 0.00–0.07)
Basophils Absolute: 0 10*3/uL (ref 0.0–0.1)
Basophils Relative: 1 %
Eosinophils Absolute: 0.1 10*3/uL (ref 0.0–0.5)
Eosinophils Relative: 1 %
HCT: 34.5 % — ABNORMAL LOW (ref 39.0–52.0)
Hemoglobin: 10.3 g/dL — ABNORMAL LOW (ref 13.0–17.0)
Immature Granulocytes: 0 %
Lymphocytes Relative: 14 %
Lymphs Abs: 0.9 10*3/uL (ref 0.7–4.0)
MCH: 26.3 pg (ref 26.0–34.0)
MCHC: 29.9 g/dL — ABNORMAL LOW (ref 30.0–36.0)
MCV: 88.2 fL (ref 80.0–100.0)
Monocytes Absolute: 0.4 10*3/uL (ref 0.1–1.0)
Monocytes Relative: 6 %
Neutro Abs: 5 10*3/uL (ref 1.7–7.7)
Neutrophils Relative %: 78 %
Platelet Count: 232 10*3/uL (ref 150–400)
RBC: 3.91 MIL/uL — ABNORMAL LOW (ref 4.22–5.81)
RDW: 19.8 % — ABNORMAL HIGH (ref 11.5–15.5)
WBC Count: 6.4 10*3/uL (ref 4.0–10.5)
nRBC: 0 % (ref 0.0–0.2)

## 2019-11-06 LAB — IRON AND TIBC
Iron: 31 ug/dL — ABNORMAL LOW (ref 42–163)
Saturation Ratios: 13 % — ABNORMAL LOW (ref 20–55)
TIBC: 234 ug/dL (ref 202–409)
UIBC: 203 ug/dL (ref 117–376)

## 2019-11-06 LAB — FERRITIN: Ferritin: 118 ng/mL (ref 24–336)

## 2019-11-06 MED ORDER — HEPARIN SOD (PORK) LOCK FLUSH 100 UNIT/ML IV SOLN
500.0000 [IU] | Freq: Once | INTRAVENOUS | Status: AC | PRN
  Administered 2019-11-06: 500 [IU] via INTRAVENOUS
  Filled 2019-11-06: qty 5

## 2019-11-06 MED ORDER — LORATADINE 10 MG PO TABS
10.0000 mg | ORAL_TABLET | Freq: Once | ORAL | Status: AC
  Administered 2019-11-06: 10 mg via ORAL

## 2019-11-06 MED ORDER — SODIUM CHLORIDE 0.9% FLUSH
10.0000 mL | INTRAVENOUS | Status: DC | PRN
  Administered 2019-11-06: 10 mL via INTRAVENOUS
  Filled 2019-11-06: qty 10

## 2019-11-06 MED ORDER — SODIUM CHLORIDE 0.9 % IV SOLN
510.0000 mg | Freq: Once | INTRAVENOUS | Status: AC
  Administered 2019-11-06: 510 mg via INTRAVENOUS
  Filled 2019-11-06: qty 510

## 2019-11-06 MED ORDER — MEGESTROL ACETATE 625 MG/5ML PO SUSP
625.0000 mg | Freq: Every day | ORAL | 0 refills | Status: DC
Start: 2019-11-06 — End: 2020-01-24

## 2019-11-06 MED ORDER — LORATADINE 10 MG PO TABS
ORAL_TABLET | ORAL | Status: AC
  Filled 2019-11-06: qty 1

## 2019-11-06 MED ORDER — SODIUM CHLORIDE 0.9 % IV SOLN
INTRAVENOUS | Status: DC
  Filled 2019-11-06: qty 250

## 2019-11-06 NOTE — Progress Notes (Signed)
Patient declined to stay for 30 minute post observation. VSS upon discharge. 

## 2019-11-06 NOTE — Patient Instructions (Signed)

## 2019-11-07 ENCOUNTER — Telehealth: Payer: Self-pay | Admitting: Nurse Practitioner

## 2019-11-07 NOTE — Telephone Encounter (Signed)
Scheduled per 6/28 los. Pt is aware of appt on 7/12

## 2019-11-08 ENCOUNTER — Encounter (HOSPITAL_COMMUNITY): Payer: Self-pay | Admitting: Internal Medicine

## 2019-11-14 ENCOUNTER — Telehealth: Payer: Self-pay

## 2019-11-14 DIAGNOSIS — C162 Malignant neoplasm of body of stomach: Secondary | ICD-10-CM

## 2019-11-14 NOTE — Telephone Encounter (Signed)
Mrs Narez left vm stating that Dr.Byerly's office would not make an appointment until last ov note faxed to them.  Referral, ov and pathology faxed to [phone]

## 2019-11-20 ENCOUNTER — Inpatient Hospital Stay: Payer: Medicare HMO | Attending: Hematology | Admitting: Nutrition

## 2019-11-20 ENCOUNTER — Encounter: Payer: Self-pay | Admitting: Nutrition

## 2019-11-20 DIAGNOSIS — C163 Malignant neoplasm of pyloric antrum: Secondary | ICD-10-CM | POA: Insufficient documentation

## 2019-11-20 DIAGNOSIS — E785 Hyperlipidemia, unspecified: Secondary | ICD-10-CM | POA: Insufficient documentation

## 2019-11-20 DIAGNOSIS — Z9221 Personal history of antineoplastic chemotherapy: Secondary | ICD-10-CM | POA: Insufficient documentation

## 2019-11-20 DIAGNOSIS — I10 Essential (primary) hypertension: Secondary | ICD-10-CM | POA: Insufficient documentation

## 2019-11-20 DIAGNOSIS — Z5111 Encounter for antineoplastic chemotherapy: Secondary | ICD-10-CM | POA: Insufficient documentation

## 2019-11-20 DIAGNOSIS — Z85028 Personal history of other malignant neoplasm of stomach: Secondary | ICD-10-CM | POA: Insufficient documentation

## 2019-11-20 DIAGNOSIS — M1990 Unspecified osteoarthritis, unspecified site: Secondary | ICD-10-CM | POA: Insufficient documentation

## 2019-11-20 DIAGNOSIS — N401 Enlarged prostate with lower urinary tract symptoms: Secondary | ICD-10-CM | POA: Insufficient documentation

## 2019-11-20 DIAGNOSIS — F17210 Nicotine dependence, cigarettes, uncomplicated: Secondary | ICD-10-CM | POA: Insufficient documentation

## 2019-11-20 DIAGNOSIS — Z79899 Other long term (current) drug therapy: Secondary | ICD-10-CM | POA: Insufficient documentation

## 2019-11-20 NOTE — Progress Notes (Signed)
Patient did not show up for nutrition appointment. 

## 2019-11-22 ENCOUNTER — Other Ambulatory Visit: Payer: Self-pay

## 2019-11-22 ENCOUNTER — Inpatient Hospital Stay: Payer: Medicare HMO

## 2019-11-22 ENCOUNTER — Inpatient Hospital Stay: Payer: Medicare HMO | Admitting: Hematology

## 2019-11-22 DIAGNOSIS — D500 Iron deficiency anemia secondary to blood loss (chronic): Secondary | ICD-10-CM | POA: Insufficient documentation

## 2019-11-22 NOTE — Progress Notes (Signed)
Gabriel Poole   [phone] Fax:([phone]) [phone]   Clinic Follow up Note   Patient Care Team: Wannetta Sender, FNP as PCP - General (Family Medicine) Truitt Merle, MD as Consulting Physician (Hematology) Alla Feeling, NP as Nurse Practitioner (Nurse Practitioner) Stark Klein, MD as Consulting Physician (General Surgery) Rogene Houston, MD as Consulting Physician (Gastroenterology) Milus Banister, MD as Attending Physician (Gastroenterology) Karie Mainland, RD as Dietitian (Nutrition)  Date of Service:  11/23/2019  CHIEF COMPLAINT: F/u on gastric cancer and IDA  SUMMARY OF ONCOLOGIC HISTORY: Oncology History Overview Note  Cancer Staging Malignant neoplasm of body of stomach ([place]) Staging form: Stomach, AJCC 8th Edition - Clinical stage from 03/11/2017: Stage IIB (cT3, cN0, cM0) - Unsigned - Pathologic stage from 04/22/2017: Stage IIIB (pT4a, pN3a, cM0) - Signed by Alla Feeling, NP on 05/17/2017     Malignant neoplasm of body of stomach ([place])  02/17/2017 Initial Diagnosis   Malignant neoplasm of body of stomach ([place])   02/17/2017 Pathology Results   Diagnosis Stomach, biopsy, gastric ulcer - ADENOCARCINOMA. Microscopic Comment Several of the fragments are involved by moderately differentiated adenocarcinoma.   02/17/2017 Procedure   UPPER ENDOSCOPY  FINDINGS - Normal esophagus. - Z-line irregular, 44 cm from the incisors. - Red blood in the gastric body and in the gastric antrum. - Large gastric ulcer. Biopsied. - Erythematous mucosa in the antrum. - Normal cardia, gastric fundus, gastric body and pylorus. - Normal duodenal bulb and second portion of the duodenum. Comment: Endoscopic appearence concerning for malignant ulcer.   03/11/2017 Procedure   UPPER EUS PER DR. Ardis Hughs Findings: 1. The esophagus was normal. 2. There was a 2-3cm ulcerated, malignant mass along the greater curvature of the stomach, approximately  mid-body. The mass was non-circumferential. 3. The duodenum was normal. Endosonographic Finding 1. The gastric mass above correlated with a hypoechoic and heterogenous non-circumferential mass that measured 2.9cm across, 26m deep. The endosonographic borders were poorly defined and there was clear sonographic evidence suggesting invasion into and through the muscularis propria layer without evident invasion into nearby organs (uT3). 2. The duodenal lymphnode described on recent CT scan appeared reactive by [place]. 3. No perigastric adenopathy (uN0) 4. Limited views of the liver, spleen, pancreas, bile duct, gallbladder were all normal. - Along the greater curvature of the stomach, approximately mid-body, there is a 2.9cm uT3N0 (clinical stage IIB) gastric adenocarcinoma.   04/06/2017 Imaging   CT CHEST IMPRESSION: 1. No acute cardiopulmonary abnormalities. 2. Two small pulmonary nodules are noted measuring up to 5 mm. Nonspecific but warrant attention on follow-up imaging follow up 3. Nonspecific hyperdense, possibly enhancing lesion along the dome of liver is identified. In a patient that is at increased risk for more definitive assessment of this structure with contrast enhanced MRI of the liver is advised.   04/21/2017 Imaging   MR ABDOMEN IMPRESSION: 1. Enhancing 2.9 cm mass along the lesser curvature in the gastric antrum, compatible with known gastric malignancy . 2. Mildly enlarged gastrohepatic ligament lymph node, cannot exclude nodal metastasis. 3. No definite liver metastatic disease. Hyperenhancing 0.9 cm liver dome mass remains inconclusive, although the MRI features are most suggestive of a flash filling hemangioma, and the mass has been stable for nearly 2 months. Additional subcentimeter focus of hyperenhancement in the lateral segment left liver lobe is most likely a benign transient vascular phenomenon. Recommend attention to these lesions on a follow-up MRI  abdomen without and with IV contrast in  3-6 months. This recommendation follows ACR consensus guidelines: Management of Incidental Liver Lesions on CT: A White Paper of the ACR Incidental Findings Committee. J Am Coll Radiol 2017; 27:0350-0938. 4. Benign right adrenal adenoma.     04/22/2017 Pathology Results   Diagnosis 1. Liver, biopsy - BILE DUCT HAMARTOMA. - THERE IS NO EVIDENCE OF MALIGNANCY. 2. Lymph nodes, regional resection, portal - METASTATIC CARCINOMA IN 2 OF 6 LYMPH NODES (2/6). 3. Stomach, resection for tumor, distal - INVASIVE ADENOCARCINOMA, POORLY DIFFERENTIATED, SPANNING 3.8 CM. - PERINEURAL INVASION IS IDENTIFIED. - ADENOCARCINOMA INVOLVES THE SEROSA. - METASTATIC CARCINOMA IN 12 OF 30 LYMPH NODES (12/30), WITH EXTRACAPSULAR EXTENSION. - SEE ONCOLOGY TABLE BELOW. 4. Lymph node, biopsy, common hepatic artery - THERE IS NO EVIDENCE OF CARCINOMA IN 1 OF 1 LYMPH NODE (0/1). Microscopic Comment 3. STOMACH: Specimen: Stomach. Procedure: Partial gastrectomy. Tumor Site: Greater curvature. Tumor Size: 3.8 cm Histologic Type: Adenocarcinoma. Histologic Grade: G3: poorly differentiated. Microscopic Extent of Tumor: Adenocarcinoma involves the serosa. Margins (select all that apply): Adenoca Proximal Margin: Negative for adenocarcinoma. Distal Margin: Negative for adenocarcinoma. Treatment Effect: N/A Lymph-Vascular Invasion: Not identified. Perineural Invasion: Present. Additional findings: Chronic gastritis. Ancillary testing: Can be performed upon clinician request. 1 of 3 Duplicate copy FINAL for [name], [name] ([id number]) Microscopic Comment(continued) Lymph nodes: number examined - 37; number positive: 14 Pathologic Staging: pT4a, pN3a (JBK:gt, 04/27/17)   06/02/2017 - 09/30/2017 Adjuvant Chemotherapy   FOLFOX every 2 weeks, oxaliplatin stopped in March 2019 due to neuropathy, chemo stopped on 09/30/2017 per pt's request    10/29/2017 Imaging    10/29/2017 CT CAP  IMPRESSION: 1. Status post distal gastrectomy. No evidence for metastatic disease in the chest, abdomen, or pelvis. 2. Stable tiny right pulmonary nodules. Continued attention on follow-up recommended. 3. 9 mm hypervascular lesion in the dome of the liver is stable over multiple studies back to 04/05/2017. Continued attention on follow-up suggested. 4. Ascending thoracic aorta measures 4.2 cm diameter. Recommend annual imaging followup by CTA or MRA. This recommendation follows 2010 ACCF/AHA/AATS/ACR/ASA/SCA/SCAI/SIR/STS/SVM Guidelines for the Diagnosis and Management of Patients with Thoracic Aortic Disease. Circulation. 2010; 121: R678-L381 . 5. Marked prostatomegaly. 6.  Aortic Atherosclerois (ICD10-170.0)   05/09/2018 Imaging   05/09/2018 CT CAP IMPRESSION: 1. No definite findings to suggest metastatic disease to the chest, abdomen or pelvis. 2. Multiple small pulmonary nodules scattered throughout the lungs bilaterally measuring 5 mm or less in size, stable compared to prior studies from 2018, favored to be benign. Continued attention on future follow-up examinations is recommended. 3. Aortic atherosclerosis with ectasia of the ascending thoracic aorta (4 cm in diameter). Recommend annual imaging followup by CTA or MRA. This recommendation follows 2010 ACCF/AHA/AATS/ACR/ASA/SCA/SCAI/SIR/STS/SVM Guidelines for the Diagnosis and Management of Patients with Thoracic Aortic Disease. Circulation. 2010; 121: O175-Z025. 4. Multiple small calculi lying dependently in the right-side of the urinary bladder. 5. Severe prostatomegaly. 6. Additional incidental findings, as above.   04/04/2019 Imaging   CT AP IMPRESSION: 1. Redemonstrated postoperative findings of distal gastrectomy and gastrojejunostomy (series 2, image 18, series 4, image 27). No evidence of malignant recurrence or metastatic disease in the abdomen or pelvis. 2. Subcutaneous body fat is  substantially diminished in comparison to prior examination, in keeping with reported history of weight loss. 3. Gross prostatomegaly and urinary bladder wall thickening, likely due to chronic outlet obstruction. 4.  Aortic Atherosclerosis (ICD10-I70.0).   08/16/2019 Imaging   CT CAP w contrast IMPRESSION: 1. No acute findings within the chest, abdomen or pelvis.  No specific findings identified to suggest residual or recurrent tumor or metastatic disease. 2. Small nonspecific pulmonary nodules are unchanged. 3. Marked enlargement of the prostate gland. 4. Multiple tiny bladder stones. 5. Aortic atherosclerosis. Aortic Atherosclerosis (ICD10-I70.0).   11/02/2019 Procedure   EGD impression - Normal hypopharynx. - Normal esophagus. - Z-line regular, 45 cm from the incisors. - Patent Billroth II gastrojejunostomy was found. 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 Biopsied. - Gastric ulcer with raised margins appears to be separate lesion. Biopsied.  comment: recurrent tumor at anastamosis appears to be separate form fundal lesion. these lesions are the source of chronic blood loss.  Colonoscopy impression: - Perianal skin tags found on perianal exam. - The entire examined colon is normal. - External and internal hemorrhoids. - No specimens collected.   11/02/2019 Pathology Results   FINAL MICROSCOPIC DIAGNOSIS:  A. STOMACH, BIOPSY:  -  Poorly differentiated carcinoma  -  See comment  B. STOMACH, FUNDUS, BIOPSY:  -  Adenocarcinoma  -  See comment  COMMENT:  A and B.  The carcinoma in part A is more poorly differentiated than  what is seen in part B but both are favored to be adenocarcinoma.  Dr.  Jeannie Done reviewed the case and agrees with the above diagnosis. Dr.  Laural Golden was notified of these results on November 03, 2019.       CURRENT THERAPY:  PENDING FOLFOX every 2 weeks in 2 weeks   INTERVAL HISTORY:  Gabriel Poole is here for a follow up of gastric cancer and  IDA. He presents to the clinic with ex-wife and daughter. He note having indigestion when eating. He has been taking more Ensure to mostly maintain weight. He notes he is open to seeing Dr Barry Dienes to discuss why she does not recommend surgery. He notes he is open to chemotherapy and treatment overall. He notes he has very strong faith. His ex-wife was very emotional about this. She and the patient declined SW and [place] services.    REVIEW OF SYSTEMS:   Constitutional: Denies fevers, chills or abnormal weight loss Eyes: Denies blurriness of vision Ears, nose, mouth, throat, and face: Denies mucositis or sore throat Respiratory: Denies cough, dyspnea or wheezes Cardiovascular: Denies palpitation, chest discomfort or lower extremity swelling Gastrointestinal:  Denies nausea, heartburn or change in bowel habits (+) Indigestion Skin: Denies abnormal skin rashes Lymphatics: Denies new lymphadenopathy or easy bruising Neurological:Denies numbness, tingling or new weaknesses Behavioral/Psych: Mood is stable, no new changes  All other systems were reviewed with the patient and are negative.  MEDICAL HISTORY:  Past Medical History:  Diagnosis Date  . Arthritis   . Cancer [facility]) dx oct 02-2017   stomach  . Gout   . Heart murmur   . Hyperlipidemia   . Hypertension   . Stomach cancer [facility])     SURGICAL HISTORY: Past Surgical History:  Procedure Laterality Date  . BIOPSY  11/02/2019   Procedure: BIOPSY;  Surgeon: Rogene Houston, MD;  [facility];  Service: Endoscopy;;  gastric  . COLONOSCOPY    . COLONOSCOPY N/A 11/02/2019   Procedure: COLONOSCOPY;  Surgeon: Rogene Houston, MD;  [facility];  Service: Endoscopy;  Laterality: N/A;  100  . ESOPHAGOGASTRODUODENOSCOPY N/A 02/17/2017   Procedure: ESOPHAGOGASTRODUODENOSCOPY (EGD);  Surgeon: Rogene Houston, MD;  [facility];  Service: Endoscopy;  Laterality: N/A;  3:00  . ESOPHAGOGASTRODUODENOSCOPY N/A  11/02/2019   Procedure: ESOPHAGOGASTRODUODENOSCOPY (EGD);  Surgeon: Hildred Laser  U, MD;  [facility];  Service: Endoscopy;  Laterality: N/A;  . EUS N/A 03/11/2017   Procedure: UPPER ENDOSCOPIC ULTRASOUND (EUS) LINEAR;  Surgeon: Milus Banister, MD;  [facility];  Service: Endoscopy;  Laterality: N/A;  . EUS N/A 03/11/2017   Procedure: UPPER ENDOSCOPIC ULTRASOUND (EUS) RADIAL;  Surgeon: Milus Banister, MD;  [facility];  Service: Endoscopy;  Laterality: N/A;  . FINGER SURGERY     Lt middle   . GASTRECTOMY N/A 04/22/2017   Procedure: PARTIAL GASTRECTOMY;  Surgeon: Stark Klein, MD;  Location: [place];  Service: General;  Laterality: N/A;  . GASTROJEJUNOSTOMY N/A 04/22/2017   Procedure: JEJUNAL FEEDING TUBE PLACEMENT;  Surgeon: Stark Klein, MD;  Location: [place];  Service: General;  Laterality: N/A;  . HYDROCELE EXCISION  02/12/2011   Procedure: HYDROCELECTOMY ADULT;  Surgeon: Marissa Nestle;  Location: [facility];  Service: Urology;  Laterality: Left;  . LAPAROSCOPY N/A 04/22/2017   Procedure: LAPAROSCOPY DIAGNOSTIC ERAS PATHWAY;  Surgeon: Stark Klein, MD;  Location: [place];  Service: General;  Laterality: N/A;  EPIDURAL  . PORTACATH PLACEMENT N/A 05/28/2017   Procedure: INSERTION PORT-A-CATH ERAS PATHWAY;  Surgeon: Stark Klein, MD;  Location: [place];  Service: General;  Laterality: N/A;    I have reviewed the social history and family history with the patient and they are unchanged from previous note.  ALLERGIES:  is allergic to penicillins.  MEDICATIONS:  Current Outpatient Medications  Medication Sig Dispense Refill  . amLODipine (NORVASC) 10 MG tablet Take 1 tablet (10 mg total) by mouth daily. 90 tablet 0  . atorvastatin (LIPITOR) 40 MG tablet Take 1 tablet (40 mg total) by mouth daily at 6 PM. 90 tablet 0  . Cholecalciferol (VITAMIN D3) 5000 units CAPS Take 5,000 Units by mouth daily.    . finasteride (PROSCAR) 5 MG tablet Take  5 mg by mouth daily.    Marland Kitchen gabapentin (NEURONTIN) 300 MG capsule Take 300 mg by mouth daily.    Marland Kitchen HYDROcodone-acetaminophen (NORCO/VICODIN) 5-325 MG tablet SMARTSIG:1 Tablet(s) By Mouth Every 12 Hours    . tadalafil (CIALIS) 5 MG tablet Take 5 mg by mouth daily.    . tamsulosin (FLOMAX) 0.4 MG CAPS capsule Take 0.4 mg by mouth daily.    Marland Kitchen triamterene-hydrochlorothiazide (MAXZIDE-25) 37.5-25 MG tablet Take 1 tablet by mouth daily.    Marland Kitchen gabapentin (NEURONTIN) 100 MG capsule Take 100-200 mg by mouth at bedtime. (Patient not taking: Reported on 11/23/2019)    . megestrol (MEGACE ES) 625 MG/5ML suspension Take 5 mLs (625 mg total) by mouth daily. (Patient not taking: Reported on 11/23/2019) 150 mL 0  . metoprolol tartrate (LOPRESSOR) 50 MG tablet Take 50 mg by mouth 2 (two) times daily. (Patient not taking: Reported on 11/06/2019)    . potassium chloride SA (K-DUR,KLOR-CON) 20 MEQ tablet Take 1 tablet (20 mEq total) by mouth 2 (two) times daily. (Patient not taking: Reported on 11/23/2019) 40 tablet 1   No current facility-administered medications for this visit.    PHYSICAL EXAMINATION: ECOG PERFORMANCE STATUS: 1 - Symptomatic but completely ambulatory  Vitals:   11/23/19 1520  BP: 122/73  Pulse: (!) 54  Resp: 16  Temp: 98.4 F (36.9 C)  SpO2: 100%   Filed Weights   11/23/19 1520  Weight: 133 lb 8 oz (60.6 kg)    Due to COVID19 we will limit examination to appearance. Patient had no complaints.  GENERAL:alert, no distress and comfortable SKIN: skin  color normal, no rashes or significant lesions EYES: normal, Conjunctiva are pink and non-injected, sclera clear  NEURO: alert & oriented x 3 with fluent speech   LABORATORY DATA:  I have reviewed the data as listed CBC Latest Ref Rng & Units 11/23/2019 11/06/2019 11/02/2019  WBC 4.0 - 10.5 K/uL 8.0 6.4 -  Hemoglobin 13.0 - 17.0 g/dL 10.2(L) 10.3(L) 11.0(L)  Hematocrit 39 - 52 % 33.0(L) 34.5(L) 38.2(L)  Platelets 150 - 400 K/uL 257 232 -      CMP Latest Ref Rng & Units 11/23/2019 11/06/2019 10/18/2019  Glucose 70 - 99 mg/dL 84 108(H) 59(L)  BUN 8 - 23 mg/dL _0 Creatinine 0.61 - 1.24 mg/dL 1.08 1.07 0.92  Sodium 135 - 145 mmol/L 140 142 145  Potassium 3.5 - 5.1 mmol/L 4.4 3.5 3.8  Chloride 98 - 111 mmol/L 110 109 112(H)  CO2 22 - 32 mmol/L _1 Calcium 8.9 - 10.3 mg/dL 8.8(L) 8.8(L) 8.8(L)  Total Protein 6.5 - 8.1 g/dL 5.5(L) 5.3(L) 5.6(L)  Total Bilirubin 0.3 - 1.2 mg/dL 0.3 0.2(L) 0.3  Alkaline Phos 38 - 126 U/L 83 89 92  AST 15 - 41 U/L _2 ALT 0 - 44 U/L _3 RADIOGRAPHIC STUDIES: I have personally reviewed the radiological images as listed and agreed with the findings in the report. No results found.   ASSESSMENT & PLAN:  CURVIN HUNGER is a 74 y.o. male with    1. Primary adenocarcinoma of the pyloric antrum, pT4a, PN3aM0, stage IIIB, Grade3, MSI-stable, recurrence at the anastomosis and new adnocarcinoma in the gastric fundus in 10/2019  -Diagnosed in 02/2017.S/psurgical resection and adjuvant chemotherapyFOLFOX. Patient was not compliant with chemo, oxaliplatin stopped after 4 cycles, and 5-FU stopped after 8 cycles treatment per his request. -Currently onsurveillance. We discussed is a high risk of recurrence due to he is locally advanced disease. -His last colonoscopy was in 2015, previously followedby Dr. Paulita Fujita. Surveillance CTs have been negative for recurrence or metastatic disease, last done 08/16/19.  -He developed weight loss and recurrent IDA recently. His 10/2019 GI work up by Dr. Laural Golden and path shows recurrent poorly differentiated adenocarcinoma at the anastomosis with additional focus of adeno in the gastric fundus.  -I discussed his case in GI Tumor Board and Dr. Barry Dienes did not offer surgery. She feels due to the recurrence he likely has microscopic disease outside of stomach which surgery will not be able to remove. He would like to discuss this with Dr Barry Dienes. I  also discussed he has option of second surgical opinion.  -I discussed without surgery or with metastatic disease his cancer is not curable but still treatable to control his disease and prolong his life. His wife was quite shocked by the news  -I recommend PET scan next week to further evaluate for metastatic disease.  -I recommend chemotherapy with prior FOLFOX q2weeks. He no longer has Neuropathy from prior chemo, but will monitor. He still has PAC in place. He is open to treatment and chemotherapy.  -will check HER2, and PD-L1 to see if he is a candidate for EGFR and immunotherapy  -Labs reviewed, CBC and CMP WNL except Hg 10.2, Ca 8.8, protein 5.5, albumin 3.1.  -f/u in 2 weeks with start of chemo.   2. Recurrent iron deficiency anemia, secondary to cancer recurrence -He initially had low serum iron and low transferrin saturation in 06/2017 after cancer resection; iron and Ferritin  were normal in the interval. -He developed worsening anemia in 03/2019, Hg 10.2 on 11/19, ferritin 12, serum iron 21 and 6% transferrin saturation, consistent with iron deficiency -He has required blood transfusion on 08/03/19 and 10/25/19. He has received IV Feraheme 5 times since 08/08/19.  -GI work up in 11/02/19 with Dr Laural Golden showed this to be secondary to local cancer recurrence.  -Labs today show Hg 10.2 (11/23/19). Will monitor on chemo.   3. Weight loss  -secondary to cancer recurrence. -He was previously referred to our dietician for guidance -He continues to have appetite and weight fluctuates.   4.Ascendingaortic aneurysm -Monitored on surveillance scans for his gastric cancer  5. Smokingcessation -Hesmokes less now, 1 cigarette a day occasionally. -I advised him to completely stop smoking and offered joining a smoking cessation program, but states that he has transportation issue.  6. BPH and elevated PSA -He has nocturia and urinary frequency, history of BPH, and elevated PSA in July  2019 -He was seen by urologist before,will f/u -PriorCT scan findings showed severe prostatomegaly  Plan -he will see Dr Barry Dienes next week  -PET next week  -Lab, flush, f/u and chemo FOLFOX in 2 and 4 weeks  -will request HER2, PD-L1 and EBV on his recent biopsy sample    No problem-specific Assessment & Plan notes found for this encounter.   No orders of the defined types were placed in this encounter.  His wife had many questions and I answered to her satisfaction. The patient knows to call the clinic with any problems, questions or concerns. No barriers to learning was detected. The total time spent in the appointment was 40 minutes.     Truitt Merle, MD 11/23/2019   I, Joslyn Devon, am acting as scribe for Truitt Merle, MD.   I have reviewed the above documentation for accuracy and completeness, and I agree with the above.

## 2019-11-23 ENCOUNTER — Other Ambulatory Visit: Payer: Self-pay

## 2019-11-23 ENCOUNTER — Encounter: Payer: Self-pay | Admitting: Hematology

## 2019-11-23 ENCOUNTER — Inpatient Hospital Stay: Payer: Medicare HMO

## 2019-11-23 ENCOUNTER — Inpatient Hospital Stay (HOSPITAL_BASED_OUTPATIENT_CLINIC_OR_DEPARTMENT_OTHER): Payer: Medicare HMO | Admitting: Hematology

## 2019-11-23 VITALS — BP 122/73 | HR 54 | Temp 98.4°F | Resp 16 | Ht 70.0 in | Wt 133.5 lb

## 2019-11-23 DIAGNOSIS — Z95828 Presence of other vascular implants and grafts: Secondary | ICD-10-CM

## 2019-11-23 DIAGNOSIS — N401 Enlarged prostate with lower urinary tract symptoms: Secondary | ICD-10-CM | POA: Diagnosis not present

## 2019-11-23 DIAGNOSIS — C162 Malignant neoplasm of body of stomach: Secondary | ICD-10-CM

## 2019-11-23 DIAGNOSIS — Z79899 Other long term (current) drug therapy: Secondary | ICD-10-CM | POA: Diagnosis not present

## 2019-11-23 DIAGNOSIS — Z5111 Encounter for antineoplastic chemotherapy: Secondary | ICD-10-CM | POA: Diagnosis present

## 2019-11-23 DIAGNOSIS — Z7189 Other specified counseling: Secondary | ICD-10-CM | POA: Diagnosis not present

## 2019-11-23 DIAGNOSIS — C163 Malignant neoplasm of pyloric antrum: Secondary | ICD-10-CM | POA: Diagnosis present

## 2019-11-23 DIAGNOSIS — Z9221 Personal history of antineoplastic chemotherapy: Secondary | ICD-10-CM | POA: Diagnosis not present

## 2019-11-23 DIAGNOSIS — Z85028 Personal history of other malignant neoplasm of stomach: Secondary | ICD-10-CM | POA: Diagnosis not present

## 2019-11-23 DIAGNOSIS — D500 Iron deficiency anemia secondary to blood loss (chronic): Secondary | ICD-10-CM | POA: Diagnosis not present

## 2019-11-23 DIAGNOSIS — F17210 Nicotine dependence, cigarettes, uncomplicated: Secondary | ICD-10-CM | POA: Diagnosis not present

## 2019-11-23 DIAGNOSIS — E785 Hyperlipidemia, unspecified: Secondary | ICD-10-CM | POA: Diagnosis not present

## 2019-11-23 DIAGNOSIS — I10 Essential (primary) hypertension: Secondary | ICD-10-CM | POA: Diagnosis not present

## 2019-11-23 DIAGNOSIS — M1990 Unspecified osteoarthritis, unspecified site: Secondary | ICD-10-CM | POA: Diagnosis not present

## 2019-11-23 LAB — CBC WITH DIFFERENTIAL (CANCER CENTER ONLY)
Abs Immature Granulocytes: 0.04 10*3/uL (ref 0.00–0.07)
Basophils Absolute: 0 10*3/uL (ref 0.0–0.1)
Basophils Relative: 0 %
Eosinophils Absolute: 0 10*3/uL (ref 0.0–0.5)
Eosinophils Relative: 0 %
HCT: 33 % — ABNORMAL LOW (ref 39.0–52.0)
Hemoglobin: 10.2 g/dL — ABNORMAL LOW (ref 13.0–17.0)
Immature Granulocytes: 1 %
Lymphocytes Relative: 12 %
Lymphs Abs: 1 10*3/uL (ref 0.7–4.0)
MCH: 27.2 pg (ref 26.0–34.0)
MCHC: 30.9 g/dL (ref 30.0–36.0)
MCV: 88 fL (ref 80.0–100.0)
Monocytes Absolute: 0.4 10*3/uL (ref 0.1–1.0)
Monocytes Relative: 6 %
Neutro Abs: 6.5 10*3/uL (ref 1.7–7.7)
Neutrophils Relative %: 81 %
Platelet Count: 257 10*3/uL (ref 150–400)
RBC: 3.75 MIL/uL — ABNORMAL LOW (ref 4.22–5.81)
RDW: 18.9 % — ABNORMAL HIGH (ref 11.5–15.5)
WBC Count: 8 10*3/uL (ref 4.0–10.5)
nRBC: 0 % (ref 0.0–0.2)

## 2019-11-23 LAB — CMP (CANCER CENTER ONLY)
ALT: 11 U/L (ref 0–44)
AST: 15 U/L (ref 15–41)
Albumin: 3.1 g/dL — ABNORMAL LOW (ref 3.5–5.0)
Alkaline Phosphatase: 83 U/L (ref 38–126)
Anion gap: 6 (ref 5–15)
BUN: 17 mg/dL (ref 8–23)
CO2: 24 mmol/L (ref 22–32)
Calcium: 8.8 mg/dL — ABNORMAL LOW (ref 8.9–10.3)
Chloride: 110 mmol/L (ref 98–111)
Creatinine: 1.08 mg/dL (ref 0.61–1.24)
GFR, Est AFR Am: >60 mL/min (ref >60)
GFR, Estimated: >60 mL/min (ref >60)
Glucose, Bld: 84 mg/dL (ref 70–99)
Potassium: 4.4 mmol/L (ref 3.5–5.1)
Sodium: 140 mmol/L (ref 135–145)
Total Bilirubin: 0.3 mg/dL (ref 0.3–1.2)
Total Protein: 5.5 g/dL — ABNORMAL LOW (ref 6.5–8.1)

## 2019-11-23 MED ORDER — SODIUM CHLORIDE 0.9% FLUSH
10.0000 mL | INTRAVENOUS | Status: DC | PRN
  Administered 2019-11-23: 10 mL via INTRAVENOUS
  Filled 2019-11-23: qty 10

## 2019-11-23 MED ORDER — HEPARIN SOD (PORK) LOCK FLUSH 100 UNIT/ML IV SOLN
500.0000 [IU] | Freq: Once | INTRAVENOUS | Status: AC | PRN
  Administered 2019-11-23: 500 [IU] via INTRAVENOUS
  Filled 2019-11-23: qty 5

## 2019-11-24 ENCOUNTER — Encounter: Payer: Self-pay | Admitting: Hematology

## 2019-11-24 ENCOUNTER — Telehealth: Payer: Self-pay | Admitting: Hematology

## 2019-11-24 DIAGNOSIS — Z7189 Other specified counseling: Secondary | ICD-10-CM | POA: Insufficient documentation

## 2019-11-24 MED ORDER — ONDANSETRON HCL 8 MG PO TABS: 8.0000 mg | ORAL_TABLET | Freq: Two times a day (BID) | ORAL | 1 refills | Status: DC | PRN

## 2019-11-24 MED ORDER — PROCHLORPERAZINE MALEATE 10 MG PO TABS: 10.0000 mg | ORAL_TABLET | Freq: Four times a day (QID) | ORAL | 1 refills | Status: DC | PRN

## 2019-11-24 MED ORDER — LIDOCAINE-PRILOCAINE 2.5-2.5 % EX CREA: TOPICAL_CREAM | CUTANEOUS | 3 refills | Status: DC

## 2019-11-24 NOTE — Telephone Encounter (Signed)
No 7/15 los

## 2019-11-24 NOTE — Progress Notes (Signed)
START ON PATHWAY REGIMEN - Gastroesophageal     A cycle is every 14 days:     Oxaliplatin      Leucovorin      Fluorouracil      Fluorouracil   **Always confirm dose/schedule in your pharmacy ordering system**  Patient Characteristics: Distant Metastases (cM1/pM1) / Locally Recurrent Disease, Adenocarcinoma - Esophageal, [place], and Gastric, First Line, HER2 Negative/Unknown, PD?L1 Expression  CPS < 5/Negative/Unknown Histology: Adenocarcinoma Disease Classification: Gastric Therapeutic Status: Local Recurrence (No Additional Staging) Line of Therapy: First Line HER2 Status: Awaiting Test Results PD-L1 Expression Status: Awaiting Test Results Intent of Therapy: Non-Curative / Palliative Intent, Discussed with Patient

## 2019-11-28 ENCOUNTER — Telehealth: Payer: Self-pay | Admitting: Nurse Practitioner

## 2019-11-28 ENCOUNTER — Telehealth: Payer: Self-pay | Admitting: *Deleted

## 2019-11-28 MED ORDER — HYDROCODONE-ACETAMINOPHEN 5-325 MG PO TABS: 1.0000 | ORAL_TABLET | Freq: Four times a day (QID) | ORAL | 0 refills | Status: DC | PRN

## 2019-11-28 NOTE — Telephone Encounter (Signed)
Wife left message stating he has ben having abdominal pain all day. He is requesting something for pain. Pt is at work currently.

## 2019-11-28 NOTE — Telephone Encounter (Signed)
I received a message from [place] via my nurse that the patient is having stomach pain "all day." I spoke to him via her phone when she met him at work, he does not have a cell phone. Gabriel Poole states he has an intermittent nagging stomach pain after meals, not like indigestion. Pain is 8/10 today. He is passing gas, having BM, denies vomiting. He previously took norco but has not needed pain medication recently. I will refill norco 1 tab q6h PRN. I reviewed our narcotics policy. He understands. Will work on scheduling his PET and chemo appointments. Will call Marilyn's phone and patient's home # with updates. He understands and appreciates the call.   Cira Rue, NP

## 2019-11-29 ENCOUNTER — Telehealth: Payer: Self-pay | Admitting: Hematology

## 2019-11-29 NOTE — Telephone Encounter (Signed)
Scheduled per 7/15 los. Spoke with [employer] and is aware of appts added.

## 2019-11-30 ENCOUNTER — Other Ambulatory Visit: Payer: Self-pay

## 2019-12-01 NOTE — Progress Notes (Signed)
[facility]   [phone] [phone]   Clinic Follow up Note   Patient Care Team: Wannetta Sender, FNP as PCP - General (Family Medicine) Truitt Merle, MD as Consulting Physician (Hematology) Alla Feeling, NP as Nurse Practitioner (Nurse Practitioner) Stark Klein, MD as Consulting Physician (General Surgery) Rogene Houston, MD as Consulting Physician (Gastroenterology) Milus Banister, MD as Attending Physician (Gastroenterology) Karie Mainland, RD as Dietitian (Nutrition)  Date of Service:  12/07/2019  CHIEF COMPLAINT: F/u on gastric cancer and IDA  SUMMARY OF ONCOLOGIC HISTORY: Oncology History Overview Note  Cancer Staging Malignant neoplasm of body of stomach ([place]) Staging form: Stomach, AJCC 8th Edition - Clinical stage from 03/11/2017: Stage IIB (cT3, cN0, cM0) - Unsigned - Pathologic stage from 04/22/2017: Stage IIIB (pT4a, pN3a, cM0) - Signed by Alla Feeling, NP on 05/17/2017     Malignant neoplasm of body of stomach ([place])  02/17/2017 Initial Diagnosis   Malignant neoplasm of body of stomach ([place])   02/17/2017 Pathology Results   Diagnosis Stomach, biopsy, gastric ulcer - ADENOCARCINOMA. Microscopic Comment Several of the fragments are involved by moderately differentiated adenocarcinoma.   02/17/2017 Procedure   UPPER ENDOSCOPY  FINDINGS - Normal esophagus. - Z-line irregular, 44 cm from the incisors. - Red blood in the gastric body and in the gastric antrum. - Large gastric ulcer. Biopsied. - Erythematous mucosa in the antrum. - Normal cardia, gastric fundus, gastric body and pylorus. - Normal duodenal bulb and second portion of the duodenum. Comment: Endoscopic appearence concerning for malignant ulcer.   03/11/2017 Procedure   UPPER EUS PER DR. Ardis Hughs Findings: 1. The esophagus was normal. 2. There was a 2-3cm ulcerated, malignant mass along the greater curvature of the stomach, approximately  mid-body. The mass was non-circumferential. 3. The duodenum was normal. Endosonographic Finding 1. The gastric mass above correlated with a hypoechoic and heterogenous non-circumferential mass that measured 2.9cm across, 35m deep. The endosonographic borders were poorly defined and there was clear sonographic evidence suggesting invasion into and through the muscularis propria layer without evident invasion into nearby organs (uT3). 2. The duodenal lymphnode described on recent CT scan appeared reactive by [place]. 3. No perigastric adenopathy (uN0) 4. Limited views of the liver, spleen, pancreas, bile duct, gallbladder were all normal. - Along the greater curvature of the stomach, approximately mid-body, there is a 2.9cm uT3N0 (clinical stage IIB) gastric adenocarcinoma.   04/06/2017 Imaging   CT CHEST IMPRESSION: 1. No acute cardiopulmonary abnormalities. 2. Two small pulmonary nodules are noted measuring up to 5 mm. Nonspecific but warrant attention on follow-up imaging follow up 3. Nonspecific hyperdense, possibly enhancing lesion along the dome of liver is identified. In a patient that is at increased risk for more definitive assessment of this structure with contrast enhanced MRI of the liver is advised.   04/21/2017 Imaging   MR ABDOMEN IMPRESSION: 1. Enhancing 2.9 cm mass along the lesser curvature in the gastric antrum, compatible with known gastric malignancy . 2. Mildly enlarged gastrohepatic ligament lymph node, cannot exclude nodal metastasis. 3. No definite liver metastatic disease. Hyperenhancing 0.9 cm liver dome mass remains inconclusive, although the MRI features are most suggestive of a flash filling hemangioma, and the mass has been stable for nearly 2 months. Additional subcentimeter focus of hyperenhancement in the lateral segment left liver lobe is most likely a benign transient vascular phenomenon. Recommend attention to these lesions on a follow-up MRI  abdomen without and with IV contrast in  3-6 months. This recommendation follows ACR consensus guidelines: Management of Incidental Liver Lesions on CT: A White Paper of the ACR Incidental Findings Committee. J Am Coll Radiol 2017; 27:0350-0938. 4. Benign right adrenal adenoma.     04/22/2017 Pathology Results   Diagnosis 1. Liver, biopsy - BILE DUCT HAMARTOMA. - THERE IS NO EVIDENCE OF MALIGNANCY. 2. Lymph nodes, regional resection, portal - METASTATIC CARCINOMA IN 2 OF 6 LYMPH NODES (2/6). 3. Stomach, resection for tumor, distal - INVASIVE ADENOCARCINOMA, POORLY DIFFERENTIATED, SPANNING 3.8 CM. - PERINEURAL INVASION IS IDENTIFIED. - ADENOCARCINOMA INVOLVES THE SEROSA. - METASTATIC CARCINOMA IN 12 OF 30 LYMPH NODES (12/30), WITH EXTRACAPSULAR EXTENSION. - SEE ONCOLOGY TABLE BELOW. 4. Lymph node, biopsy, common hepatic artery - THERE IS NO EVIDENCE OF CARCINOMA IN 1 OF 1 LYMPH NODE (0/1). Microscopic Comment 3. STOMACH: Specimen: Stomach. Procedure: Partial gastrectomy. Tumor Site: Greater curvature. Tumor Size: 3.8 cm Histologic Type: Adenocarcinoma. Histologic Grade: G3: poorly differentiated. Microscopic Extent of Tumor: Adenocarcinoma involves the serosa. Margins (select all that apply): Adenoca Proximal Margin: Negative for adenocarcinoma. Distal Margin: Negative for adenocarcinoma. Treatment Effect: N/A Lymph-Vascular Invasion: Not identified. Perineural Invasion: Present. Additional findings: Chronic gastritis. Ancillary testing: Can be performed upon clinician request. 1 of 3 Duplicate copy FINAL for [name], [name] ([id number]) Microscopic Comment(continued) Lymph nodes: number examined - 37; number positive: 14 Pathologic Staging: pT4a, pN3a (JBK:gt, 04/27/17)   06/02/2017 - 09/30/2017 Adjuvant Chemotherapy   FOLFOX every 2 weeks, oxaliplatin stopped in March 2019 due to neuropathy, chemo stopped on 09/30/2017 per pt's request    10/29/2017 Imaging    10/29/2017 CT CAP  IMPRESSION: 1. Status post distal gastrectomy. No evidence for metastatic disease in the chest, abdomen, or pelvis. 2. Stable tiny right pulmonary nodules. Continued attention on follow-up recommended. 3. 9 mm hypervascular lesion in the dome of the liver is stable over multiple studies back to 04/05/2017. Continued attention on follow-up suggested. 4. Ascending thoracic aorta measures 4.2 cm diameter. Recommend annual imaging followup by CTA or MRA. This recommendation follows 2010 ACCF/AHA/AATS/ACR/ASA/SCA/SCAI/SIR/STS/SVM Guidelines for the Diagnosis and Management of Patients with Thoracic Aortic Disease. Circulation. 2010; 121: R678-L381 . 5. Marked prostatomegaly. 6.  Aortic Atherosclerois (ICD10-170.0)   05/09/2018 Imaging   05/09/2018 CT CAP IMPRESSION: 1. No definite findings to suggest metastatic disease to the chest, abdomen or pelvis. 2. Multiple small pulmonary nodules scattered throughout the lungs bilaterally measuring 5 mm or less in size, stable compared to prior studies from 2018, favored to be benign. Continued attention on future follow-up examinations is recommended. 3. Aortic atherosclerosis with ectasia of the ascending thoracic aorta (4 cm in diameter). Recommend annual imaging followup by CTA or MRA. This recommendation follows 2010 ACCF/AHA/AATS/ACR/ASA/SCA/SCAI/SIR/STS/SVM Guidelines for the Diagnosis and Management of Patients with Thoracic Aortic Disease. Circulation. 2010; 121: O175-Z025. 4. Multiple small calculi lying dependently in the right-side of the urinary bladder. 5. Severe prostatomegaly. 6. Additional incidental findings, as above.   04/04/2019 Imaging   CT AP IMPRESSION: 1. Redemonstrated postoperative findings of distal gastrectomy and gastrojejunostomy (series 2, image 18, series 4, image 27). No evidence of malignant recurrence or metastatic disease in the abdomen or pelvis. 2. Subcutaneous body fat is  substantially diminished in comparison to prior examination, in keeping with reported history of weight loss. 3. Gross prostatomegaly and urinary bladder wall thickening, likely due to chronic outlet obstruction. 4.  Aortic Atherosclerosis (ICD10-I70.0).   08/16/2019 Imaging   CT CAP w contrast IMPRESSION: 1. No acute findings within the chest, abdomen or pelvis.  No specific findings identified to suggest residual or recurrent tumor or metastatic disease. 2. Small nonspecific pulmonary nodules are unchanged. 3. Marked enlargement of the prostate gland. 4. Multiple tiny bladder stones. 5. Aortic atherosclerosis. Aortic Atherosclerosis (ICD10-I70.0).   11/02/2019 Procedure   EGD impression - Normal hypopharynx. - Normal esophagus. - Z-line regular, 45 cm from the incisors. - Patent Billroth II gastrojejunostomy was found. 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 Biopsied. - Gastric ulcer with raised margins appears to be separate lesion. Biopsied.  comment: recurrent tumor at anastamosis appears to be separate form fundal lesion. these lesions are the source of chronic blood loss.  Colonoscopy impression: - Perianal skin tags found on perianal exam. - The entire examined colon is normal. - External and internal hemorrhoids. - No specimens collected.   11/02/2019 Pathology Results   FINAL MICROSCOPIC DIAGNOSIS:  A. STOMACH, BIOPSY:  -  Poorly differentiated carcinoma  -  See comment  B. STOMACH, FUNDUS, BIOPSY:  -  Adenocarcinoma  -  See comment  COMMENT:  A and B.  The carcinoma in part A is more poorly differentiated than  what is seen in part B but both are favored to be adenocarcinoma.  Dr.  Jeannie Done reviewed the case and agrees with the above diagnosis. Dr.  Laural Golden was notified of these results on November 03, 2019.    12/05/2019 PET scan   IMPRESSION: Marked hypermetabolic activity within the thickened stomach in this patient with known gastric cancer with associated  metastatic disease to the liver and small lymph nodes in the area of the hepatic gastric recess that are suspicious based on location and size though not particularly FDG avid.   Small lymph nodes along the posterolateral margin of the aorta with mild increased FDG uptake (image 107) (SUVmax = 2.8 these are approximately 6-7 mm and there are several small nodes tracking beneath the LEFT retrocrural region. Attention on follow-up)   Increased FDG uptake in the central portion of the prostate likely within the prosthetic urethra given the marked hypermetabolic features in the central location. Would however consider correlation with PSA as clinically warranted. Prostate is markedly enlarged otherwise with heterogeneous FDG uptake.   Aortic Atherosclerosis (ICD10-I70.0).    12/07/2019 -  Chemotherapy   FOLFOX every 2 weeks starting 12/07/19       CURRENT THERAPY:  FOLFOX every 2 weeks starting 12/07/19   INTERVAL HISTORY:  Gabriel Poole is here for a follow up and treatment. He presents to the clinic alone. He notes he is doing well. He notes he is still losing weight. He has been taking more carnation breakfast. He notes he is not eating much otherwise. He notes no one at home cooks. He notes his family will help cook for him when they can. He denies nausea or change in BM. He denies blood in stool. 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     REVIEW OF SYSTEMS:   Constitutional: Denies fevers, chills (+) weight loss Eyes: Denies blurriness of vision Ears, nose, mouth, throat, and face: Denies mucositis or sore throat Respiratory: Denies cough, dyspnea or wheezes Cardiovascular: Denies palpitation, chest discomfort or lower extremity swelling Gastrointestinal:  Denies nausea, heartburn or change in bowel habits Skin: Denies abnormal skin rashes Lymphatics: Denies new lymphadenopathy or easy bruising Neurological: (+) Mild right hand neuropathy  Behavioral/Psych: Mood is  stable, no new changes  All other systems were reviewed with the patient and are negative.  MEDICAL HISTORY:  Past Medical History:  Diagnosis Date  . Arthritis   . Cancer [facility]) dx oct 02-2017   stomach  . Gout   . Heart murmur   . Hyperlipidemia   . Hypertension   . Stomach cancer [facility])     SURGICAL HISTORY: Past Surgical History:  Procedure Laterality Date  . BIOPSY  11/02/2019   Procedure: BIOPSY;  Surgeon: Rogene Houston, MD;  [facility];  Service: Endoscopy;;  gastric  . COLONOSCOPY    . COLONOSCOPY N/A 11/02/2019   Procedure: COLONOSCOPY;  Surgeon: Rogene Houston, MD;  [facility];  Service: Endoscopy;  Laterality: N/A;  100  . ESOPHAGOGASTRODUODENOSCOPY N/A 02/17/2017   Procedure: ESOPHAGOGASTRODUODENOSCOPY (EGD);  Surgeon: Rogene Houston, MD;  [facility];  Service: Endoscopy;  Laterality: N/A;  3:00  . ESOPHAGOGASTRODUODENOSCOPY N/A 11/02/2019   Procedure: ESOPHAGOGASTRODUODENOSCOPY (EGD);  Surgeon: Rogene Houston, MD;  [facility];  Service: Endoscopy;  Laterality: N/A;  . EUS N/A 03/11/2017   Procedure: UPPER ENDOSCOPIC ULTRASOUND (EUS) LINEAR;  Surgeon: Milus Banister, MD;  [facility];  Service: Endoscopy;  Laterality: N/A;  . EUS N/A 03/11/2017   Procedure: UPPER ENDOSCOPIC ULTRASOUND (EUS) RADIAL;  Surgeon: Milus Banister, MD;  Location: [facility];  Service: Endoscopy;  Laterality: N/A;  . FINGER SURGERY     Lt middle   . GASTRECTOMY N/A 04/22/2017   Procedure: PARTIAL GASTRECTOMY;  Surgeon: Stark Klein, MD;  Location: [place];  Service: General;  Laterality: N/A;  . GASTROJEJUNOSTOMY N/A 04/22/2017   Procedure: JEJUNAL FEEDING TUBE PLACEMENT;  Surgeon: Stark Klein, MD;  Location: [place];  Service: General;  Laterality: N/A;  . HYDROCELE EXCISION  02/12/2011   Procedure: HYDROCELECTOMY ADULT;  Surgeon: Marissa Nestle;  Location: [facility];  Service: Urology;  Laterality: Left;  . LAPAROSCOPY  N/A 04/22/2017   Procedure: LAPAROSCOPY DIAGNOSTIC ERAS PATHWAY;  Surgeon: Stark Klein, MD;  Location: [place];  Service: General;  Laterality: N/A;  EPIDURAL  . PORTACATH PLACEMENT N/A 05/28/2017   Procedure: INSERTION PORT-A-CATH ERAS PATHWAY;  Surgeon: Stark Klein, MD;  Location: [place];  Service: General;  Laterality: N/A;    I have reviewed the social history and family history with the patient and they are unchanged from previous note.  ALLERGIES:  is allergic to penicillins.  MEDICATIONS:  Current Outpatient Medications  Medication Sig Dispense Refill  . amLODipine (NORVASC) 10 MG tablet Take 1 tablet (10 mg total) by mouth daily. 90 tablet 0  . atorvastatin (LIPITOR) 40 MG tablet Take 1 tablet (40 mg total) by mouth daily at 6 PM. 90 tablet 0  . Cholecalciferol (VITAMIN D3) 5000 units CAPS Take 5,000 Units by mouth daily.    . finasteride (PROSCAR) 5 MG tablet Take 5 mg by mouth daily.    Marland Kitchen gabapentin (NEURONTIN) 100 MG capsule Take 100-200 mg by mouth at bedtime.     . gabapentin (NEURONTIN) 300 MG capsule Take 300 mg by mouth daily.    Marland Kitchen HYDROcodone-acetaminophen (NORCO/VICODIN) 5-325 MG tablet Take 1 tablet by mouth every 6 (six) hours as needed for moderate pain. 30 tablet 0  . lidocaine-prilocaine (EMLA) cream Apply to affected area once 30 g 3  . megestrol (MEGACE ES) 625 MG/5ML suspension Take 5 mLs (625 mg total) by mouth daily. 150 mL 0  . metoprolol tartrate (LOPRESSOR) 50 MG tablet Take 50 mg by mouth 2 (two) times daily.     . ondansetron (ZOFRAN) 8 MG tablet  Take 1 tablet (8 mg total) by mouth 2 (two) times daily as needed for refractory nausea / vomiting. Start on day 3 after chemotherapy. 30 tablet 1  . potassium chloride SA (K-DUR,KLOR-CON) 20 MEQ tablet Take 1 tablet (20 mEq total) by mouth 2 (two) times daily. 40 tablet 1  . prochlorperazine (COMPAZINE) 10 MG tablet Take 1 tablet (10 mg total) by mouth every 6 (six) hours as needed (Nausea or  vomiting). 30 tablet 1  . tadalafil (CIALIS) 5 MG tablet Take 5 mg by mouth daily.    . tamsulosin (FLOMAX) 0.4 MG CAPS capsule Take 0.4 mg by mouth daily.    Marland Kitchen triamterene-hydrochlorothiazide (MAXZIDE-25) 37.5-25 MG tablet Take 1 tablet by mouth daily.     No current facility-administered medications for this visit.   Facility-Administered Medications Ordered in Other Visits  Medication Dose Route Frequency Provider Last Rate Last Admin  . dexamethasone (DECADRON) 10 mg in sodium chloride 0.9 % 50 mL IVPB  10 mg Intravenous Once Truitt Merle, MD      . fluorouracil (ADRUCIL) 4,150 mg in sodium chloride 0.9 % 67 mL chemo infusion  2,400 mg/m2 (Treatment Plan Recorded) Intravenous 1 day or 1 dose Truitt Merle, MD      . leucovorin 692 mg in dextrose 5 % 250 mL infusion  400 mg/m2 (Treatment Plan Recorded) Intravenous Once Truitt Merle, MD      . oxaliplatin (ELOXATIN) 120 mg in dextrose 5 % 500 mL chemo infusion  70 mg/m2 (Treatment Plan Recorded) Intravenous Once Truitt Merle, MD      . palonosetron (ALOXI) injection 0.25 mg  0.25 mg Intravenous Once Truitt Merle, MD        PHYSICAL EXAMINATION: ECOG PERFORMANCE STATUS: 1 - Symptomatic but completely ambulatory  Vitals:   12/07/19 0829  BP: (!) 174/91  Pulse: 58  Resp: 18  Temp: (!) 97 F (36.1 C)  SpO2: 100%   Filed Weights   12/07/19 0829  Weight: 128 lb 8 oz (58.3 kg)    GENERAL:alert, no distress and comfortable SKIN: skin color, texture, turgor are normal, no rashes or significant lesions EYES: normal, Conjunctiva are pink and non-injected, sclera clear  NECK: supple, thyroid normal size, non-tender, without nodularity LYMPH:  no palpable lymphadenopathy in the cervical, axillary  LUNGS: clear to auscultation and percussion with normal breathing effort HEART: regular rate & rhythm and no murmurs and no lower extremity edema ABDOMEN:abdomen soft, non-tender and normal bowel sounds Musculoskeletal:no cyanosis of digits and no clubbing    NEURO: alert & oriented x 3 with fluent speech (+) sensory deficits of right upper extremity   LABORATORY DATA:  I have reviewed the data as listed CBC Latest Ref Rng & Units 12/07/2019 11/23/2019 11/06/2019  WBC 4.0 - 10.5 K/uL 7.0 8.0 6.4  Hemoglobin 13.0 - 17.0 g/dL 10.9(L) 10.2(L) 10.3(L)  Hematocrit 39 - 52 % 34.5(L) 33.0(L) 34.5(L)  Platelets 150 - 400 K/uL 291 257 232     CMP Latest Ref Rng & Units 12/07/2019 11/23/2019 11/06/2019  Glucose 70 - 99 mg/dL 78 84 108(H)  BUN 8 - 23 mg/dL _0 Creatinine 0.61 - 1.24 mg/dL 1.07 1.08 1.07  Sodium 135 - 145 mmol/L 139 140 142  Potassium 3.5 - 5.1 mmol/L 4.0 4.4 3.5  Chloride 98 - 111 mmol/L 110 110 109  CO2 22 - 32 mmol/L _1 Calcium 8.9 - 10.3 mg/dL 9.5 8.8(L) 8.8(L)  Total Protein 6.5 - 8.1 g/dL 5.9(L) 5.5(L) 5.3(L)  Total Bilirubin 0.3 - 1.2 mg/dL 0.5 0.3 0.2(L)  Alkaline Phos 38 - 126 U/L 79 83 89  AST 15 - 41 U/L _0 ALT 0 - 44 U/L _1 RADIOGRAPHIC STUDIES: I have personally reviewed the radiological images as listed and agreed with the findings in the report. NM PET Image Restag (PS) Skull Base To Thigh  Result Date: 12/05/2019 CLINICAL DATA:  Subsequent treatment strategy for gastric cancer. EXAM: NUCLEAR MEDICINE PET SKULL BASE TO THIGH TECHNIQUE: 6.72 mCi F-18 FDG was injected intravenously. Full-ring PET imaging was performed from the skull base to thigh after the radiotracer. CT data was obtained and used for attenuation correction and anatomic localization. Fasting blood glucose: 76 mg/dl COMPARISON:  Chest abdomen pelvis 08/16/2019 FINDINGS: Mediastinal blood pool activity: SUV max 2.00 Liver activity: SUV max NA NECK: No hypermetabolic lymph nodes in the neck. Incidental CT findings: none CHEST: No hypermetabolic mediastinal or hilar nodes. No suspicious pulmonary nodules on the CT scan. Incidental CT findings: Calcified atheromatous plaque of the thoracic aorta. 4 cm caliber of the ascending  thoracic aorta is unchanged. Heart size is stable without pericardial effusion. LEFT-sided Port-A-Cath terminates at the caval to atrial junction. No adenopathy by size criteria in the chest. Parenchymal assessment of the lung parenchyma limited by respiratory motion. No effusion. Airways are patent. ABDOMEN/PELVIS: Marked hypermetabolic activity associated with the remnant stomach following Billroth 2 with marked thickening of the stomach. (SUVmax = 11.6) Focal area of hypermetabolism in the medial segment of the LEFT hepatic lobe just along the boundary of the middle hepatic vein (image 113, series 4) very subtle area of hypoattenuation and corresponding FDG uptake. (SUVmax = 18.4) The hepatic gastric recess also with signs of nodal enlargement (image 116, series 4 (SUVmax = 2.7) area measures 1 cm. Paucity of mesenteric and intra-abdominal as well as retroperitoneal and body wall fat limits assessment in terms of CT images. No additional focal areas of increased metabolic activity to suggest additional sites of metastatic disease in the abdomen or pelvis. Incidental CT findings: Spleen normal in size and contour. Pancreas without inflammation. Adrenal glands are normal. Renal cysts bilaterally as before. No hydronephrosis. Urinary bladder under distended. Colonic diverticulosis, mild. Stool fills much of the colon. No acute gastrointestinal abnormality. Prostate is markedly enlarged. Central area of increased FDG uptake more likely within the prosthetic urethra based on the central location with heterogeneous uptake elsewhere SKELETON: No focal hypermetabolic activity to suggest skeletal metastasis. Incidental CT findings: none IMPRESSION: Marked hypermetabolic activity within the thickened stomach in this patient with known gastric cancer with associated metastatic disease to the liver and small lymph nodes in the area of the hepatic gastric recess that are suspicious based on location and size though not  particularly FDG avid. Small lymph nodes along the posterolateral margin of the aorta with mild increased FDG uptake (image 107) (SUVmax = 2.8 these are approximately 6-7 mm and there are several small nodes tracking beneath the LEFT retrocrural region. Attention on follow-up) Increased FDG uptake in the central portion of the prostate likely within the prosthetic urethra given the marked hypermetabolic features in the central location. Would however consider correlation with PSA as clinically warranted. Prostate is markedly enlarged otherwise with heterogeneous FDG uptake. Aortic Atherosclerosis (ICD10-I70.0).  For Electronically Signed   By: Zetta Bills M.D.   On: 12/05/2019 16:24     ASSESSMENT & PLAN:  Gabriel Poole is a 74 y.o. male with  1. Primary adenocarcinoma of the pyloric antrum, pT4a, PN3aM0, stage IIIB, Grade3, MSI-stable, recurrence at the anastomosis and new adenocarcinoma in the gastric fundus in 10/2019, oligo liver metastasis 11/2019 -Initially diagnosed in 02/2017.S/psurgical resection and adjuvant chemotherapyFOLFOX. Patient was not compliant with chemo, oxaliplatin stopped after 4 cycles, and 5-FU stopped after 8 cycles treatment per his request. -He developed weight loss and recurrent IDA recently. His 10/2019 GI work up by Dr. Laural Golden and path shows recurrent poorly differentiated adenocarcinoma at the anastomosis with additional focus of adeno in the gastric fundus.  -I previously discussed his case in GI Tumor Board and Dr. Barry Dienes did not offer surgery.  -I personally reviewed and discussed his PET from 12/05/19 which showed known gastric cancer with associated metastatic disease to the liver and small lymph nodes in the area of the hepatic gastric recess.  -I discussed option of US Liver. If lesion is seen he will proceed with biopsy. Given cancer is not resectable, treatment plan would not change either way, but his prognosis would be worse if we determine he has  liver metastasis. He is agreeable.  -I recommend systemic treatment with chemo FOLFOX q2weeks. Based on his pending Molecular and genomic testing, will add biological agent to treatment to improve efficacy. I reviewed side effects with him. Will start chemo on lower dose and monitor his neuropathy.  -Labs reviewed and adequate to start FOLFOX today at reduced dose.  -F/u in 2 weeks    2. Recurrent iron deficiency anemia, secondary to cancer recurrence -He initially had low serum iron and low transferrin saturation in 06/2017 after cancer resection; iron and Ferritin were normal in the interval. -He developed worsening anemia in 03/2019, Hg 10.2 on 11/19, ferritin 12, serum iron 21 and 6% transferrin saturation, consistent with iron deficiency -He has required blood transfusion on 08/03/19 and 10/25/19. He has received IV Feraheme 5 times since 08/08/19.  -GI work up in 11/02/19 with Dr Laural Golden showed this to be secondary to local cancer recurrence.  -Currently mild and stable anemia. Will monitor on chemo.   3. Weight loss  -secondary to cancer recurrence. -Recently he has not been eating as much as no one at his home cooks. He relies more on supplemental nutrition which is not enough. I recommend he eat more frequent small meals and increase nutritional supplements.  -I recommend he f/u with dietician.   4.Ascendingaortic aneurysm -Monitored on surveillance scans for his gastric cancer  5. Smokingcessation -Hesmokes less now, 1 cigarette a day occasionally. -I advised him to completely stop smoking and offered joining a smoking cessation program, but states that he has transportation issue.  6. BPH and elevated PSA -He has nocturia and urinary frequency, history of BPH, and elevated PSA in July 2019 -He was previously seen by urologist before and had prostate shaved.  -PriorCT scan findings showed severe prostatomegaly. PET from 12/05/19 showed marked hypermetabolic uptake in this  area. I will repeat PSA, will hold on biopsy for now due to his metastatic gastric cancer.   7. 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 This is currently manageable.  -Will monitor on restart of lower dose FOLFOX   Plan -F/u with Dietician, I sent a message to [facility] to see if she can see him sooner  -US Liver biopsy in 1-2 weeks by IR -Labs reviewed and adequate to proceed with C1 FOLFOX today at decreased dose of oxaliplatin  -Lab, flush, f/u and FOLFOX  in 2, 4, 6 weeks  -he will see Dr Barry Dienes on 8/9  -I called path lab to check his molecular testing MMR, PD-L1 and Her2 test status  -I will call his ex-wife to update her the PET scan result    No problem-specific Assessment & Plan notes found for this encounter.   Orders Placed This Encounter  Procedures  . US BIOPSY (LIVER)    Standing Status:   Future    Standing Expiration Date:   12/06/2020    Order Specific Question:   Lab orders requested (DO NOT place separate lab orders, these will be automatically ordered during procedure specimen collection):    Answer:   Surgical Pathology    Order Specific Question:   Reason for Exam (SYMPTOM  OR DIAGNOSIS REQUIRED)    Answer:   confirm metastatic disease    Order Specific Question:   Preferred location?    Answer:   [facility]   All questions were answered. The patient knows to call the clinic with any problems, questions or concerns. No barriers to learning was detected. The total time spent in the appointment was 40 minutes.     Truitt Merle, MD 12/07/2019   I, Joslyn Devon, am acting as scribe for Truitt Merle, MD.   I have reviewed the above documentation for accuracy and completeness, and I agree with the above.

## 2019-12-01 NOTE — Progress Notes (Signed)
Pharmacist Chemotherapy Monitoring - Initial Assessment    Anticipated start date: 12/07/19 (pt has been on FOLFOX before; 06/02/17 - 07/14/17 Oxali stopped due to neuropathy)  Regimen:  . Are orders appropriate based on the patient's diagnosis, regimen, and cycle? Yes . Does the plan date match the patient's scheduled date? Yes . Is the sequencing of drugs appropriate? Yes . Are the premedications appropriate for the patient's regimen? Yes . Prior Authorization for treatment is: Approved o If applicable, is the correct biosimilar selected based on the patient's insurance? not applicable  Organ Function and Labs: Marland Kitchen Are dose adjustments needed based on the patient's renal function, hepatic function, or hematologic function? No . Are appropriate labs ordered prior to the start of patient's treatment? Yes . Other organ system assessment, if indicated: N/A . The following baseline labs, if indicated, have been ordered: N/A  Dose Assessment: . Are the drug doses appropriate? Yes  - Oxali dose reduced for h/o neuropathy . Are the following correct: o Drug concentrations Yes o IV fluid compatible with drug Yes o Administration routes Yes o Timing of therapy Yes . If applicable, does the patient have documented access for treatment and/or plans for port-a-cath placement? yes . If applicable, have lifetime cumulative doses been properly documented and assessed? yes Lifetime Dose Tracking  . Oxaliplatin: 310.45 mg/m2 (580 mg) = 51.74 % of the maximum lifetime dose of 600 mg/m2  o   Toxicity Monitoring/Prevention: . The patient has the following take home antiemetics prescribed: Ondansetron and Prochlorperazine . The patient has the following take home medications prescribed: N/A . Medication allergies and previous infusion related reactions, if applicable, have been reviewed and addressed. Yes . The patient's current medication list has been assessed for drug-drug interactions with their  chemotherapy regimen. no significant drug-drug interactions were identified on review.  Order Review: . Are the treatment plan orders signed? Yes . Is the patient scheduled to see a provider prior to their treatment? Yes  I verify that I have reviewed each item in the above checklist and answered each question accordingly.   Kennith Center, Pharm.D., CPP 12/01/2019@9 :10 AM

## 2019-12-02 ENCOUNTER — Other Ambulatory Visit: Payer: Self-pay

## 2019-12-05 ENCOUNTER — Ambulatory Visit (HOSPITAL_COMMUNITY)
Admission: RE | Admit: 2019-12-05 | Discharge: 2019-12-05 | Disposition: A | Discharge status: Home / Self Care | Payer: Medicare HMO | Source: Ambulatory Visit | Attending: Hematology | Admitting: Hematology

## 2019-12-05 ENCOUNTER — Other Ambulatory Visit: Payer: Self-pay

## 2019-12-05 DIAGNOSIS — I700 Atherosclerosis of aorta: Secondary | ICD-10-CM | POA: Insufficient documentation

## 2019-12-05 DIAGNOSIS — C787 Secondary malignant neoplasm of liver and intrahepatic bile duct: Secondary | ICD-10-CM | POA: Diagnosis not present

## 2019-12-05 DIAGNOSIS — N400 Enlarged prostate without lower urinary tract symptoms: Secondary | ICD-10-CM | POA: Diagnosis not present

## 2019-12-05 DIAGNOSIS — C162 Malignant neoplasm of body of stomach: Secondary | ICD-10-CM | POA: Insufficient documentation

## 2019-12-05 LAB — GLUCOSE, CAPILLARY: Glucose-Capillary: 76 mg/dL (ref 70–99)

## 2019-12-05 MED ORDER — FLUDEOXYGLUCOSE F - 18 (FDG) INJECTION
6.7200 | Freq: Once | INTRAVENOUS | Status: AC | PRN
  Administered 2019-12-05: 6.72 via INTRAVENOUS

## 2019-12-06 ENCOUNTER — Telehealth: Payer: Self-pay

## 2019-12-06 NOTE — Telephone Encounter (Signed)
Ms Buxton called to confirm Mr Rampy's appts for tomorrow.  I reviewed times and told her he should be her by 0730.  She verbalized understanding.  She stated she has gone back to work but she has arranged transportation for him.

## 2019-12-07 ENCOUNTER — Encounter: Payer: Self-pay | Admitting: Hematology

## 2019-12-07 ENCOUNTER — Other Ambulatory Visit: Payer: Self-pay

## 2019-12-07 ENCOUNTER — Inpatient Hospital Stay: Payer: Medicare HMO

## 2019-12-07 ENCOUNTER — Inpatient Hospital Stay (HOSPITAL_BASED_OUTPATIENT_CLINIC_OR_DEPARTMENT_OTHER): Payer: Medicare HMO | Admitting: Hematology

## 2019-12-07 ENCOUNTER — Other Ambulatory Visit: Payer: Self-pay | Admitting: Nurse Practitioner

## 2019-12-07 ENCOUNTER — Ambulatory Visit: Payer: Medicare HMO | Admitting: Nutrition

## 2019-12-07 ENCOUNTER — Other Ambulatory Visit (HOSPITAL_COMMUNITY): Payer: Self-pay

## 2019-12-07 VITALS — BP 174/91 | HR 58 | Temp 97.0°F | Resp 18 | Ht 70.0 in | Wt 128.5 lb

## 2019-12-07 DIAGNOSIS — Z95828 Presence of other vascular implants and grafts: Secondary | ICD-10-CM

## 2019-12-07 DIAGNOSIS — C162 Malignant neoplasm of body of stomach: Secondary | ICD-10-CM

## 2019-12-07 DIAGNOSIS — Z5111 Encounter for antineoplastic chemotherapy: Secondary | ICD-10-CM | POA: Diagnosis not present

## 2019-12-07 DIAGNOSIS — Z7189 Other specified counseling: Secondary | ICD-10-CM

## 2019-12-07 DIAGNOSIS — D649 Anemia, unspecified: Secondary | ICD-10-CM

## 2019-12-07 LAB — CBC WITH DIFFERENTIAL (CANCER CENTER ONLY)
Abs Immature Granulocytes: 0.02 10*3/uL (ref 0.00–0.07)
Basophils Absolute: 0 10*3/uL (ref 0.0–0.1)
Basophils Relative: 0 %
Eosinophils Absolute: 0.1 10*3/uL (ref 0.0–0.5)
Eosinophils Relative: 1 %
HCT: 34.5 % — ABNORMAL LOW (ref 39.0–52.0)
Hemoglobin: 10.9 g/dL — ABNORMAL LOW (ref 13.0–17.0)
Immature Granulocytes: 0 %
Lymphocytes Relative: 18 %
Lymphs Abs: 1.2 10*3/uL (ref 0.7–4.0)
MCH: 28.2 pg (ref 26.0–34.0)
MCHC: 31.6 g/dL (ref 30.0–36.0)
MCV: 89.4 fL (ref 80.0–100.0)
Monocytes Absolute: 0.5 10*3/uL (ref 0.1–1.0)
Monocytes Relative: 7 %
Neutro Abs: 5.2 10*3/uL (ref 1.7–7.7)
Neutrophils Relative %: 74 %
Platelet Count: 291 10*3/uL (ref 150–400)
RBC: 3.86 MIL/uL — ABNORMAL LOW (ref 4.22–5.81)
RDW: 16.9 % — ABNORMAL HIGH (ref 11.5–15.5)
WBC Count: 7 10*3/uL (ref 4.0–10.5)
nRBC: 0 % (ref 0.0–0.2)

## 2019-12-07 LAB — FERRITIN: Ferritin: 97 ng/mL (ref 24–336)

## 2019-12-07 LAB — CMP (CANCER CENTER ONLY)
ALT: 11 U/L (ref 0–44)
AST: 15 U/L (ref 15–41)
Albumin: 3.2 g/dL — ABNORMAL LOW (ref 3.5–5.0)
Alkaline Phosphatase: 79 U/L (ref 38–126)
Anion gap: 6 (ref 5–15)
BUN: 16 mg/dL (ref 8–23)
CO2: 23 mmol/L (ref 22–32)
Calcium: 9.5 mg/dL (ref 8.9–10.3)
Chloride: 110 mmol/L (ref 98–111)
Creatinine: 1.07 mg/dL (ref 0.61–1.24)
GFR, Est AFR Am: >60 mL/min (ref >60)
GFR, Estimated: >60 mL/min (ref >60)
Glucose, Bld: 78 mg/dL (ref 70–99)
Potassium: 4 mmol/L (ref 3.5–5.1)
Sodium: 139 mmol/L (ref 135–145)
Total Bilirubin: 0.5 mg/dL (ref 0.3–1.2)
Total Protein: 5.9 g/dL — ABNORMAL LOW (ref 6.5–8.1)

## 2019-12-07 LAB — IRON AND TIBC
Iron: 40 ug/dL — ABNORMAL LOW (ref 42–163)
Saturation Ratios: 16 % — ABNORMAL LOW (ref 20–55)
TIBC: 260 ug/dL (ref 202–409)
UIBC: 220 ug/dL (ref 117–376)

## 2019-12-07 MED ORDER — OXALIPLATIN CHEMO INJECTION 100 MG/20ML
70.0000 mg/m2 | Freq: Once | INTRAVENOUS | Status: AC
  Administered 2019-12-07: 120 mg via INTRAVENOUS
  Filled 2019-12-07: qty 20

## 2019-12-07 MED ORDER — PALONOSETRON HCL INJECTION 0.25 MG/5ML
INTRAVENOUS | Status: AC
  Filled 2019-12-07: qty 5

## 2019-12-07 MED ORDER — DEXTROSE 5 % IV SOLN
Freq: Once | INTRAVENOUS | Status: AC
  Filled 2019-12-07: qty 250

## 2019-12-07 MED ORDER — SODIUM CHLORIDE 0.9 % IV SOLN
10.0000 mg | Freq: Once | INTRAVENOUS | Status: AC
  Administered 2019-12-07: 10 mg via INTRAVENOUS
  Filled 2019-12-07: qty 10

## 2019-12-07 MED ORDER — LEUCOVORIN CALCIUM INJECTION 350 MG
400.0000 mg/m2 | Freq: Once | INTRAVENOUS | Status: AC
  Administered 2019-12-07: 692 mg via INTRAVENOUS
  Filled 2019-12-07: qty 34.6

## 2019-12-07 MED ORDER — SODIUM CHLORIDE 0.9% FLUSH
10.0000 mL | INTRAVENOUS | Status: DC | PRN
  Administered 2019-12-07: 10 mL via INTRAVENOUS
  Filled 2019-12-07: qty 10

## 2019-12-07 MED ORDER — PALONOSETRON HCL INJECTION 0.25 MG/5ML
0.2500 mg | Freq: Once | INTRAVENOUS | Status: AC
  Administered 2019-12-07: 0.25 mg via INTRAVENOUS

## 2019-12-07 MED ORDER — SODIUM CHLORIDE 0.9 % IV SOLN
2400.0000 mg/m2 | INTRAVENOUS | Status: DC
  Administered 2019-12-07: 4150 mg via INTRAVENOUS
  Filled 2019-12-07: qty 83

## 2019-12-07 NOTE — Patient Instructions (Signed)

## 2019-12-07 NOTE — Patient Instructions (Signed)
[facility] Discharge Instructions for Patients Receiving Chemotherapy  Today you received the following chemotherapy agents: Oxaliplatin, Leucovorin, and Fluorouracil  To help prevent nausea and vomiting after your treatment, we encourage you to take your nausea medication  as prescribed.    If you develop nausea and vomiting that is not controlled by your nausea medication, call the clinic.   BELOW ARE SYMPTOMS THAT SHOULD BE REPORTED IMMEDIATELY:  *FEVER GREATER THAN 100.5 F  *CHILLS WITH OR WITHOUT FEVER  NAUSEA AND VOMITING THAT IS NOT CONTROLLED WITH YOUR NAUSEA MEDICATION  *UNUSUAL SHORTNESS OF BREATH  *UNUSUAL BRUISING OR BLEEDING  TENDERNESS IN MOUTH AND THROAT WITH OR WITHOUT PRESENCE OF ULCERS  *URINARY PROBLEMS  *BOWEL PROBLEMS  UNUSUAL RASH Items with * indicate a potential emergency and should be followed up as soon as possible.  Feel free to call the clinic should you have any questions or concerns. The clinic phone number is [phone].  Please show the CHEMO ALERT CARD at check-in to the Emergency Department and triage nurse.   

## 2019-12-07 NOTE — Progress Notes (Signed)
74 year old male diagnosed with recurrent gastric cancer and IDA receiving FOLFOX every 2 weeks starting July 29 and followed by Dr. Burr Medico.  Past medical history includes gout, hyperlipidemia, and hypertension.  Medications include Lipitor, vitamin D3, and Megace.  Labs include glucose 78 and albumin 3.2 on July 29.  Height: 5 feet 10 inches. Weight: 128.5 pounds. Usual body weight: 138 pounds in March 2021. BMI: 18.44.  Patient is sleeping soundly however he did awaken to name called.  Reports poor appetite. Appears to eat better in the evening. Denies problems moving bowels. Reports he does not cook however his family will bring him food. He denies problems getting to the grocery store. Reports drinking Carnation breakfast essentials which he mixes up in a glass with whole milk. Patient has lost 7% of his body weight in less than 6 months.  This is concerning.  Nutrition diagnosis: Inadequate oral intake related to gastric cancer and associated treatments as evidenced by 7% weight loss in less than 6 months.  Intervention: I educated patient on continuing Carnation breakfast essentials and adding additional calories and protein by using powdered milk and whole milk together (fortified milk) Recommended he drink Carnation breakfast with meals and add Ensure Enlive between meals.  Provided 1 complementary case. Reviewed ready made and easy to purchase foods from the grocery store and encouraged him to purchase things he can just grab and eat that don't require cooking.  Also suggested microwavable meals.  Patient is agreeable.  He denies food insecurity.  Monitoring, evaluation, goals: Patient will tolerate increased calories and protein to minimize weight loss.  Next visit: Thursday, August 12 during infusion.  **Disclaimer: This note was dictated with voice recognition software. Similar sounding words can inadvertently be transcribed and this note may contain transcription errors  which may not have been corrected upon publication of note.**

## 2019-12-08 ENCOUNTER — Telehealth: Payer: Self-pay | Admitting: Hematology

## 2019-12-08 ENCOUNTER — Encounter (HOSPITAL_COMMUNITY): Payer: Self-pay

## 2019-12-08 LAB — SURGICAL PATHOLOGY

## 2019-12-08 NOTE — Telephone Encounter (Signed)
Scheduled appt per 7/29 los - pt to get an updated schedule next visit.   

## 2019-12-08 NOTE — Progress Notes (Unsigned)
[place] Male, 74 y.o., 1946-01-24 MRN:  [id number] Phone:  [phone] (H) PCP:  Wannetta Sender, FNP Coverage:  [employer] Medicare/[employer] Medicare Hmo Next Appt With Radiology ([facility]-US 2) 12/14/2019 at 1:00 PM  RE: Biopsy Received: Yesterday Message Details  Corrie Mckusick, DO  Lennox Solders E OK for US guided liver mass biopsy.   PET image 111 of fused series.    Not quite visible on the prior CT.   Earleen Newport   Previous Messages  ----- Message -----  From: Lenore Cordia  Sent: 12/07/2019 12:06 PM EDT  To: Ir Procedure Requests  Subject: Biopsy                      Procedure Requested: US Biopsy (Liver)    Reason for Procedure: confirm metastatic disease    Provider Requesting: Dr Burr Medico  Provider Telephone:  [phone]    Other Info: Rad exams in Epic

## 2019-12-09 ENCOUNTER — Other Ambulatory Visit: Payer: Self-pay

## 2019-12-09 ENCOUNTER — Inpatient Hospital Stay: Payer: Medicare HMO

## 2019-12-09 VITALS — BP 164/80 | HR 60 | Temp 97.8°F | Resp 20 | Ht 70.0 in

## 2019-12-09 DIAGNOSIS — Z5111 Encounter for antineoplastic chemotherapy: Secondary | ICD-10-CM | POA: Diagnosis not present

## 2019-12-09 DIAGNOSIS — Z7189 Other specified counseling: Secondary | ICD-10-CM

## 2019-12-09 DIAGNOSIS — C162 Malignant neoplasm of body of stomach: Secondary | ICD-10-CM

## 2019-12-09 MED ORDER — HEPARIN SOD (PORK) LOCK FLUSH 100 UNIT/ML IV SOLN
500.0000 [IU] | Freq: Once | INTRAVENOUS | Status: AC | PRN
  Administered 2019-12-09: 500 [IU]
  Filled 2019-12-09: qty 5

## 2019-12-09 MED ORDER — SODIUM CHLORIDE 0.9% FLUSH
10.0000 mL | INTRAVENOUS | Status: DC | PRN
  Administered 2019-12-09: 10 mL
  Filled 2019-12-09: qty 10

## 2019-12-09 NOTE — Patient Instructions (Signed)

## 2019-12-13 ENCOUNTER — Other Ambulatory Visit: Payer: Self-pay | Admitting: Student

## 2019-12-14 ENCOUNTER — Encounter (HOSPITAL_COMMUNITY): Payer: Self-pay

## 2019-12-14 ENCOUNTER — Ambulatory Visit (HOSPITAL_COMMUNITY)
Admission: RE | Admit: 2019-12-14 | Discharge: 2019-12-14 | Disposition: A | Discharge status: Home / Self Care | Payer: Medicare HMO | Source: Ambulatory Visit | Attending: Hematology | Admitting: Hematology

## 2019-12-14 ENCOUNTER — Other Ambulatory Visit: Payer: Self-pay

## 2019-12-14 DIAGNOSIS — E785 Hyperlipidemia, unspecified: Secondary | ICD-10-CM | POA: Diagnosis not present

## 2019-12-14 DIAGNOSIS — M1990 Unspecified osteoarthritis, unspecified site: Secondary | ICD-10-CM | POA: Diagnosis not present

## 2019-12-14 DIAGNOSIS — C169 Malignant neoplasm of stomach, unspecified: Secondary | ICD-10-CM | POA: Diagnosis not present

## 2019-12-14 DIAGNOSIS — C162 Malignant neoplasm of body of stomach: Secondary | ICD-10-CM

## 2019-12-14 DIAGNOSIS — Z79899 Other long term (current) drug therapy: Secondary | ICD-10-CM | POA: Insufficient documentation

## 2019-12-14 DIAGNOSIS — K769 Liver disease, unspecified: Secondary | ICD-10-CM | POA: Diagnosis not present

## 2019-12-14 DIAGNOSIS — I10 Essential (primary) hypertension: Secondary | ICD-10-CM | POA: Insufficient documentation

## 2019-12-14 DIAGNOSIS — Z87891 Personal history of nicotine dependence: Secondary | ICD-10-CM | POA: Diagnosis not present

## 2019-12-14 LAB — CBC
HCT: 36.6 % — ABNORMAL LOW (ref 39.0–52.0)
Hemoglobin: 11.3 g/dL — ABNORMAL LOW (ref 13.0–17.0)
MCH: 28.3 pg (ref 26.0–34.0)
MCHC: 30.9 g/dL (ref 30.0–36.0)
MCV: 91.5 fL (ref 80.0–100.0)
Platelets: 308 10*3/uL (ref 150–400)
RBC: 4 MIL/uL — ABNORMAL LOW (ref 4.22–5.81)
RDW: 16 % — ABNORMAL HIGH (ref 11.5–15.5)
WBC: 6 10*3/uL (ref 4.0–10.5)
nRBC: 0 % (ref 0.0–0.2)

## 2019-12-14 LAB — PROTIME-INR
INR: 1.2 (ref 0.8–1.2)
Prothrombin Time: 14.3 seconds (ref 11.4–15.2)

## 2019-12-14 MED ORDER — FENTANYL CITRATE (PF) 100 MCG/2ML IJ SOLN
INTRAMUSCULAR | Status: AC
  Filled 2019-12-14: qty 2

## 2019-12-14 MED ORDER — FENTANYL CITRATE (PF) 100 MCG/2ML IJ SOLN
INTRAMUSCULAR | Status: AC | PRN
  Administered 2019-12-14: 50 ug via INTRAVENOUS

## 2019-12-14 MED ORDER — MIDAZOLAM HCL 2 MG/2ML IJ SOLN
INTRAMUSCULAR | Status: AC | PRN
  Administered 2019-12-14: 1 mg via INTRAVENOUS

## 2019-12-14 MED ORDER — SODIUM CHLORIDE 0.9 % IV SOLN: INTRAVENOUS | Status: DC

## 2019-12-14 MED ORDER — SODIUM CHLORIDE 0.9 % IV SOLN
INTRAVENOUS | Status: AC | PRN
  Administered 2019-12-14: 10 mL/h via INTRAVENOUS

## 2019-12-14 MED ORDER — LIDOCAINE-EPINEPHRINE 1 %-1:100000 IJ SOLN
INTRAMUSCULAR | Status: AC
  Filled 2019-12-14: qty 1

## 2019-12-14 MED ORDER — GELATIN ABSORBABLE 12-7 MM EX MISC
CUTANEOUS | Status: AC
  Filled 2019-12-14: qty 1

## 2019-12-14 MED ORDER — MIDAZOLAM HCL 2 MG/2ML IJ SOLN
INTRAMUSCULAR | Status: AC
  Filled 2019-12-14: qty 2

## 2019-12-14 NOTE — Sedation Documentation (Signed)
02 d/c 

## 2019-12-14 NOTE — H&P (Signed)
Chief Complaint: Gastric cancer  Referring Physician(s): Truitt Merle  Supervising Physician: Sandi Mariscal  Patient Status: [facility] - Out-pt  History of Present Illness: Gabriel Poole is a 74 y.o. male with known gastric cancer.  He has been battling this since 2018.  He has undergone laparotomy with gastrectomy and bilroth procedure.  EGD with biopsy done 11/02/19 confirmed recurrence.  PET done 12/05/19 showed= Marked hypermetabolic activity within the thickened stomach in this patient with known gastric cancer with associated metastatic disease to the liver and small lymph nodes in the area of the hepatic gastric recess that are suspicious based on location and size though not particularly FDG avid.    We are asked to perform an image guided biopsy of a liver lesion.  He is NPO. He reports significant weight loss. No nausea/vomiting. No Fever/chills. ROS negative.   Past Medical History:  Diagnosis Date  . Arthritis   . Cancer [facility]) dx oct 02-2017   stomach  . Gout   . Heart murmur   . Hyperlipidemia   . Hypertension   . Stomach cancer [facility])     Past Surgical History:  Procedure Laterality Date  . BIOPSY  11/02/2019   Procedure: BIOPSY;  Surgeon: Rogene Houston, MD;  [facility];  Service: Endoscopy;;  gastric  . COLONOSCOPY    . COLONOSCOPY N/A 11/02/2019   Procedure: COLONOSCOPY;  Surgeon: Rogene Houston, MD;  [facility];  Service: Endoscopy;  Laterality: N/A;  100  . ESOPHAGOGASTRODUODENOSCOPY N/A 02/17/2017   Procedure: ESOPHAGOGASTRODUODENOSCOPY (EGD);  Surgeon: Rogene Houston, MD;  [facility];  Service: Endoscopy;  Laterality: N/A;  3:00  . ESOPHAGOGASTRODUODENOSCOPY N/A 11/02/2019   Procedure: ESOPHAGOGASTRODUODENOSCOPY (EGD);  Surgeon: Rogene Houston, MD;  [facility];  Service: Endoscopy;  Laterality: N/A;  . EUS N/A 03/11/2017   Procedure: UPPER ENDOSCOPIC ULTRASOUND (EUS) LINEAR;  Surgeon:  Milus Banister, MD;  [facility]: [facility];  Service: Endoscopy;  Laterality: N/A;  . EUS N/A 03/11/2017   Procedure: UPPER ENDOSCOPIC ULTRASOUND (EUS) RADIAL;  Surgeon: Milus Banister, MD;  Location: [facility];  Service: Endoscopy;  Laterality: N/A;  . FINGER SURGERY     Lt middle   . GASTRECTOMY N/A 04/22/2017   Procedure: PARTIAL GASTRECTOMY;  Surgeon: Stark Klein, MD;  Location: [place];  Service: General;  Laterality: N/A;  . GASTROJEJUNOSTOMY N/A 04/22/2017   Procedure: JEJUNAL FEEDING TUBE PLACEMENT;  Surgeon: Stark Klein, MD;  Location: [place];  Service: General;  Laterality: N/A;  . HYDROCELE EXCISION  02/12/2011   Procedure: HYDROCELECTOMY ADULT;  Surgeon: Marissa Nestle;  Location: [facility];  Service: Urology;  Laterality: Left;  . LAPAROSCOPY N/A 04/22/2017   Procedure: LAPAROSCOPY DIAGNOSTIC ERAS PATHWAY;  Surgeon: Stark Klein, MD;  Location: [place];  Service: General;  Laterality: N/A;  EPIDURAL  . PORTACATH PLACEMENT N/A 05/28/2017   Procedure: INSERTION PORT-A-CATH ERAS PATHWAY;  Surgeon: Stark Klein, MD;  Location: [place];  Service: General;  Laterality: N/A;    Allergies: Penicillins  Medications: Prior to Admission medications   Medication Sig Start Date End Date Taking? Authorizing Provider  amLODipine (NORVASC) 10 MG tablet Take 1 tablet (10 mg total) by mouth daily. 12/04/15  Yes Claretta Fraise, MD  atorvastatin (LIPITOR) 40 MG tablet Take 1 tablet (40 mg total) by mouth daily at 6 PM. 05/08/15  Yes Dettinger, Fransisca Kaufmann, MD  Cholecalciferol (VITAMIN D3) 5000 units CAPS Take  5,000 Units by mouth daily.   Yes [provider]  finasteride (PROSCAR) 5 MG tablet Take 5 mg by mouth daily. 10/24/19  Yes [provider]  megestrol (MEGACE ES) 625 MG/5ML suspension Take 5 mLs (625 mg total) by mouth daily. 11/06/19  Yes Alla Feeling, NP  ondansetron (ZOFRAN) 8 MG tablet Take 1 tablet (8 mg total) by mouth 2 (two) times daily as  needed for refractory nausea / vomiting. Start on day 3 after chemotherapy. 11/24/19  Yes Truitt Merle, MD  potassium chloride SA (K-DUR,KLOR-CON) 20 MEQ tablet Take 1 tablet (20 mEq total) by mouth 2 (two) times daily. 08/18/17  Yes Truitt Merle, MD  tamsulosin (FLOMAX) 0.4 MG CAPS capsule Take 0.4 mg by mouth daily. 11/16/19  Yes [provider]  triamterene-hydrochlorothiazide (MAXZIDE-25) 37.5-25 MG tablet Take 1 tablet by mouth daily.   Yes [provider]  HYDROcodone-acetaminophen (NORCO/VICODIN) 5-325 MG tablet Take 1 tablet by mouth every 6 (six) hours as needed for moderate pain. 11/28/19   Alla Feeling, NP  lidocaine-prilocaine (EMLA) cream Apply to affected area once 11/24/19   Truitt Merle, MD  prochlorperazine (COMPAZINE) 10 MG tablet Take 1 tablet (10 mg total) by mouth every 6 (six) hours as needed (Nausea or vomiting). 11/24/19   Truitt Merle, MD  tadalafil (CIALIS) 5 MG tablet Take 5 mg by mouth daily. 09/28/19   [provider]     Family History  Problem Relation Age of Onset  . Hypertension Mother   . Heart attack Father   . Cancer Maternal Aunt        leukemia  . Cancer Maternal Aunt        leukemia  . Hypotension Neg Hx   . Anesthesia problems Neg Hx   . Malignant hyperthermia Neg Hx   . Pseudochol deficiency Neg Hx     Social History   Socioeconomic History  . Marital status: Divorced    Spouse name: Not on file  . Number of children: Not on file  . Years of education: Not on file  . Highest education level: Not on file  Occupational History  . Occupation: works at [employer]  . Smoking status: Former Smoker    Packs/day: 0.50    Years: 45.00    Pack years: 22.50    Types: Cigarettes, Cigars    Quit date: 04/2017    Years since quitting: 2.6  . Smokeless tobacco: Never Used  . Tobacco comment: used to smoke 1 pack a day   Vaping Use  . Vaping Use: Never used  Substance and Sexual Activity  . Alcohol use: Yes     Alcohol/week: 0.0 standard drinks    Comment: Brandy daily, stopped in Dec 2018  . Drug use: No  . Sexual activity: Yes  Other Topics Concern  . Not on file  Social History Narrative  . Not on file   Social Determinants of Health   Financial Resource Strain:   . Difficulty of Paying Living Expenses:   Food Insecurity:   . Worried About Charity fundraiser in the Last Year:   . Arboriculturist in the Last Year:   Transportation Needs:   . Film/video editor (Medical):   Marland Kitchen Lack of Transportation (Non-Medical):   Physical Activity:   . Days of Exercise per Week:   . Minutes of Exercise per Session:   Stress:   . Feeling of Stress :   Social Connections:   .  Frequency of Communication with Friends and Family:   . Frequency of Social Gatherings with Friends and Family:   . Attends Religious Services:   . Active Member of Clubs or Organizations:   . Attends Archivist Meetings:   Marland Kitchen Marital Status:      Review of Systems: A 12 point ROS discussed and pertinent positives are indicated in the HPI above.  All other systems are negative.  Review of Systems  Vital Signs: There were no vitals taken for this visit.  Physical Exam Vitals reviewed.  Constitutional:      Appearance: Normal appearance.  HENT:     Head: Normocephalic and atraumatic.  Eyes:     Extraocular Movements: Extraocular movements intact.  Cardiovascular:     Rate and Rhythm: Normal rate and regular rhythm.  Pulmonary:     Effort: Pulmonary effort is normal. No respiratory distress.     Breath sounds: Normal breath sounds.  Abdominal:     General: There is no distension.     Palpations: Abdomen is soft.     Tenderness: There is no abdominal tenderness.  Musculoskeletal:        General: Normal range of motion.     Cervical back: Normal range of motion.  Skin:    General: Skin is warm and dry.  Neurological:     General: No focal deficit present.     Mental Status: He is alert and  oriented to person, place, and time.  Psychiatric:        Mood and Affect: Mood normal.        Behavior: Behavior normal.        Thought Content: Thought content normal.        Judgment: Judgment normal.     Imaging: NM PET Image Restag (PS) Skull Base To Thigh  Result Date: 12/05/2019 CLINICAL DATA:  Subsequent treatment strategy for gastric cancer. EXAM: NUCLEAR MEDICINE PET SKULL BASE TO THIGH TECHNIQUE: 6.72 mCi F-18 FDG was injected intravenously. Full-ring PET imaging was performed from the skull base to thigh after the radiotracer. CT data was obtained and used for attenuation correction and anatomic localization. Fasting blood glucose: 76 mg/dl COMPARISON:  Chest abdomen pelvis 08/16/2019 FINDINGS: Mediastinal blood pool activity: SUV max 2.00 Liver activity: SUV max NA NECK: No hypermetabolic lymph nodes in the neck. Incidental CT findings: none CHEST: No hypermetabolic mediastinal or hilar nodes. No suspicious pulmonary nodules on the CT scan. Incidental CT findings: Calcified atheromatous plaque of the thoracic aorta. 4 cm caliber of the ascending thoracic aorta is unchanged. Heart size is stable without pericardial effusion. LEFT-sided Port-A-Cath terminates at the caval to atrial junction. No adenopathy by size criteria in the chest. Parenchymal assessment of the lung parenchyma limited by respiratory motion. No effusion. Airways are patent. ABDOMEN/PELVIS: Marked hypermetabolic activity associated with the remnant stomach following Billroth 2 with marked thickening of the stomach. (SUVmax = 11.6) Focal area of hypermetabolism in the medial segment of the LEFT hepatic lobe just along the boundary of the middle hepatic vein (image 113, series 4) very subtle area of hypoattenuation and corresponding FDG uptake. (SUVmax = 18.4) The hepatic gastric recess also with signs of nodal enlargement (image 116, series 4 (SUVmax = 2.7) area measures 1 cm. Paucity of mesenteric and intra-abdominal as well  as retroperitoneal and body wall fat limits assessment in terms of CT images. No additional focal areas of increased metabolic activity to suggest additional sites of metastatic disease in the abdomen or pelvis.  Incidental CT findings: Spleen normal in size and contour. Pancreas without inflammation. Adrenal glands are normal. Renal cysts bilaterally as before. No hydronephrosis. Urinary bladder under distended. Colonic diverticulosis, mild. Stool fills much of the colon. No acute gastrointestinal abnormality. Prostate is markedly enlarged. Central area of increased FDG uptake more likely within the prosthetic urethra based on the central location with heterogeneous uptake elsewhere SKELETON: No focal hypermetabolic activity to suggest skeletal metastasis. Incidental CT findings: none IMPRESSION: Marked hypermetabolic activity within the thickened stomach in this patient with known gastric cancer with associated metastatic disease to the liver and small lymph nodes in the area of the hepatic gastric recess that are suspicious based on location and size though not particularly FDG avid. Small lymph nodes along the posterolateral margin of the aorta with mild increased FDG uptake (image 107) (SUVmax = 2.8 these are approximately 6-7 mm and there are several small nodes tracking beneath the LEFT retrocrural region. Attention on follow-up) Increased FDG uptake in the central portion of the prostate likely within the prosthetic urethra given the marked hypermetabolic features in the central location. Would however consider correlation with PSA as clinically warranted. Prostate is markedly enlarged otherwise with heterogeneous FDG uptake. Aortic Atherosclerosis (ICD10-I70.0).  For Electronically Signed   By: Zetta Bills M.D.   On: 12/05/2019 16:24    Labs:  CBC: Recent Labs    10/18/19 0830 10/18/19 0830 11/02/19 1035 11/06/19 1026 11/23/19 1432 12/07/19 0816  WBC 7.6  --   --  6.4 8.0 7.0  HGB 8.0*  --   11.0* 10.3* 10.2* 10.9*  HCT 27.9*   < > 38.2* 34.5* 33.0* 34.5*  PLT 322  --   --  232 257 291   < > = values in this interval not displayed.    COAGS: No results for input(s): INR, APTT in the last 8760 hours.  BMP: Recent Labs    10/18/19 0830 11/06/19 1026 11/23/19 1432 12/07/19 0816  NA 145 142 140 139  K 3.8 3.5 4.4 4.0  CL 112* 109 110 110  CO2 24 22 24 23   GLUCOSE 59* 108* 84 78  BUN 16 13 17 16   CALCIUM 8.8* 8.8* 8.8* 9.5  CREATININE 0.92 1.07 1.08 1.07  GFRNONAA >60 >60 >60 >60  GFRAA >60 >60 >60 >60    LIVER FUNCTION TESTS: Recent Labs    10/18/19 0830 11/06/19 1026 11/23/19 1432 12/07/19 0816  BILITOT 0.3 0.2* 0.3 0.5  AST 15 16 15 15   ALT 13 16 11 11   ALKPHOS 92 89 83 79  PROT 5.6* 5.3* 5.5* 5.9*  ALBUMIN 3.0* 3.0* 3.1* 3.2*    TUMOR MARKERS: No results for input(s): AFPTM, CEA, CA199, CHROMGRNA in the last 8760 hours.  Assessment and Plan:  Recurrence of gastric cancer.  Will proceed with image guided biopsy of a liver lesion to confirm metastatic disease today by Dr. Pascal Lux.  Risks and benefits of liver lesion biopsy was discussed with the patient and/or patient's family including, but not limited to bleeding, infection, damage to adjacent structures or low yield requiring additional tests.  All of the questions were answered and there is agreement to proceed.  Consent signed and in chart.  Thank you for this interesting consult.  I greatly enjoyed meeting Gabriel Poole and look forward to participating in their care.  A copy of this report was sent to the requesting provider on this date.  Electronically Signed: Murrell Redden, PA-C   12/14/2019, 12:06 PM  I spent a total of  30 Minutes in face to face in clinical consultation, greater than 50% of which was counseling/coordinating care for liver lesion biopsy.

## 2019-12-14 NOTE — Procedures (Signed)
Pre Procedure Dx: Gastric cancer Post Procedural Dx: Same  Technically successful US guided biopsy of hypermetabolic lesion within the left lobe of the liver.  EBL: None  No immediate complications.   Ronny Bacon, MD Pager #: [phone]

## 2019-12-14 NOTE — Discharge Instructions (Addendum)
Liver Biopsy, Care After These instructions give you information on caring for yourself after your procedure. Your doctor may also give you more specific instructions. Call your doctor if you have any problems or questions after your procedure. What can I expect after the procedure? After the procedure, it is common to have:  Pain and soreness where the biopsy was done.  Bruising around the area where the biopsy was done.  Sleepiness and be tired for a few days. Follow these instructions at home: Medicines  Take over-the-counter and prescription medicines only as told by your doctor.  If you were prescribed an antibiotic medicine, take it as told by your doctor. Do not stop taking the antibiotic even if you start to feel better.  Do not take medicines such as aspirin and ibuprofen. These medicines can thin your blood. Do not take these medicines unless your doctor tells you to take them.  If you are taking prescription pain medicine, take actions to prevent or treat constipation. Your doctor may recommend that you: ? Drink enough fluid to keep your pee (urine) clear or pale yellow. ? Take over-the-counter or prescription medicines. ? Eat foods that are high in fiber, such as fresh fruits and vegetables, whole grains, and beans. ? Limit foods that are high in fat and processed sugars, such as fried and sweet foods. Caring for your cut  Follow instructions from your doctor about how to take care of your cuts from surgery (incisions). Make sure you: ? Wash your hands with soap and water before you change your bandage (dressing). If you cannot use soap and water, use hand sanitizer. ? Change your bandage as told by your doctor. ? Leave stitches (sutures), skin glue, or skin tape (adhesive) strips in place. They may need to stay in place for 2 weeks or longer. If tape strips get loose and curl up, you may trim the loose edges. Do not remove tape strips completely unless your doctor says it is  okay.  Check your cuts every day for signs of infection. Check for: ? Redness, swelling, or more pain. ? Fluid or blood. ? Pus or a bad smell. ? Warmth.  Do not take baths, swim, or use a hot tub until your doctor says it is okay to do so. Activity   Rest at home for 1-2 days or as told by your doctor. ? Avoid sitting for a long time without moving. Get up to take short walks every 1-2 hours.  Return to your normal activities as told by your doctor. Ask what activities are safe for you.  Do not do these things in the first 24 hours: ? Drive. ? Use machinery. ? Take a bath or shower.  Do not lift more than 10 pounds (4.5 kg) or play contact sports for the first 2 weeks. General instructions   Do not drink alcohol in the first week after the procedure.  Have someone stay with you for at least 24 hours after the procedure.  Get your test results. Ask your doctor or the department that is doing the test: ? When will my results be ready? ? How will I get my results? ? What are my treatment options? ? What other tests do I need? ? What are my next steps?  Keep all follow-up visits as told by your doctor. This is important. Contact a doctor if:  A cut bleeds and leaves more than just a small spot of blood.  A cut is red, puffs up (  swells), or hurts more than before.  Fluid or something else comes from a cut.  A cut smells bad.  You have a fever or chills. Get help right away if:  You have swelling, bloating, or pain in your belly (abdomen).  You get dizzy or faint.  You have a rash.  You feel sick to your stomach (nauseous) or throw up (vomit).  You have trouble breathing, feel short of breath, or feel faint.  Your chest hurts.  You have problems talking or seeing.  You have trouble with your balance or moving your arms or legs. Summary  After the procedure, it is common to have pain, soreness, bruising, and tiredness.  Your doctor will tell you how to  take care of yourself at home. Change your bandage, take your medicines, and limit your activities as told by your doctor.  Call your doctor if you have symptoms of infection. Get help right away if your belly swells, your cut bleeds a lot, or you have trouble talking or breathing. This information is not intended to replace advice given to you by your health care provider. Make sure you discuss any questions you have with your health care provider. Document Revised: 05/07/2017 Document Reviewed: 05/07/2017 [employer] Patient Education  2020 [employer] Moderate Conscious Sedation, Adult Sedation is the use of medicines to promote relaxation and relieve discomfort and anxiety. Moderate conscious sedation is a type of sedation. Under moderate conscious sedation, you are less alert than normal, but you are still able to respond to instructions, touch, or both. Moderate conscious sedation is used during short medical and dental procedures. It is milder than deep sedation, which is a type of sedation under which you cannot be easily woken up. It is also milder than general anesthesia, which is the use of medicines to make you unconscious. Moderate conscious sedation allows you to return to your regular activities sooner. Tell a health care provider about:  Any allergies you have.  All medicines you are taking, including vitamins, herbs, eye drops, creams, and over-the-counter medicines.  Use of steroids (by mouth or creams).  Any problems you or family members have had with sedatives and anesthetic medicines.  Any blood disorders you have.  Any surgeries you have had.  Any medical conditions you have, such as sleep apnea.  Whether you are pregnant or may be pregnant.  Any use of cigarettes, alcohol, marijuana, or street drugs. What are the risks? Generally, this is a safe procedure. However, problems may occur, including:  Getting too much medicine (oversedation).  Nausea.  Allergic  reaction to medicines.  Trouble breathing. If this happens, a breathing tube may be used to help with breathing. It will be removed when you are awake and breathing on your own.  Heart trouble.  Lung trouble. What happens before the procedure? Staying hydrated Follow instructions from your health care provider about hydration, which may include:  Up to 2 hours before the procedure - you may continue to drink clear liquids, such as water, clear fruit juice, black coffee, and plain tea. Eating and drinking restrictions Follow instructions from your health care provider about eating and drinking, which may include:  8 hours before the procedure - stop eating heavy meals or foods such as meat, fried foods, or fatty foods.  6 hours before the procedure - stop eating light meals or foods, such as toast or cereal.  6 hours before the procedure - stop drinking milk or drinks that contain milk.  2   hours before the procedure - stop drinking clear liquids. Medicine Ask your health care provider about:  Changing or stopping your regular medicines. This is especially important if you are taking diabetes medicines or blood thinners.  Taking medicines such as aspirin and ibuprofen. These medicines can thin your blood. Do not take these medicines before your procedure if your health care provider instructs you not to.  Tests and exams  You will have a physical exam.  You may have blood tests done to show: ? How well your kidneys and liver are working. ? How well your blood can clot. General instructions  Plan to have someone take you home from the hospital or clinic.  If you will be going home right after the procedure, plan to have someone with you for 24 hours. What happens during the procedure?  An IV tube will be inserted into one of your veins.  Medicine to help you relax (sedative) will be given through the IV tube.  The medical or dental procedure will be performed. What  happens after the procedure?  Your blood pressure, heart rate, breathing rate, and blood oxygen level will be monitored often until the medicines you were given have worn off.  Do not drive for 24 hours. This information is not intended to replace advice given to you by your health care provider. Make sure you discuss any questions you have with your health care provider. Document Revised: 04/09/2017 Document Reviewed: 08/17/2015 [employer] Patient Education  2020 [employer]  

## 2019-12-15 LAB — SURGICAL PATHOLOGY

## 2019-12-21 ENCOUNTER — Inpatient Hospital Stay: Payer: Medicare HMO

## 2019-12-21 ENCOUNTER — Inpatient Hospital Stay: Payer: Medicare HMO | Admitting: Nutrition

## 2019-12-21 ENCOUNTER — Inpatient Hospital Stay: Payer: Medicare HMO | Attending: Hematology | Admitting: Hematology

## 2019-12-21 DIAGNOSIS — R634 Abnormal weight loss: Secondary | ICD-10-CM | POA: Insufficient documentation

## 2019-12-21 DIAGNOSIS — G629 Polyneuropathy, unspecified: Secondary | ICD-10-CM | POA: Insufficient documentation

## 2019-12-21 DIAGNOSIS — C162 Malignant neoplasm of body of stomach: Secondary | ICD-10-CM | POA: Insufficient documentation

## 2019-12-21 DIAGNOSIS — D509 Iron deficiency anemia, unspecified: Secondary | ICD-10-CM | POA: Insufficient documentation

## 2019-12-21 DIAGNOSIS — C163 Malignant neoplasm of pyloric antrum: Secondary | ICD-10-CM | POA: Insufficient documentation

## 2019-12-21 DIAGNOSIS — C787 Secondary malignant neoplasm of liver and intrahepatic bile duct: Secondary | ICD-10-CM | POA: Insufficient documentation

## 2019-12-21 DIAGNOSIS — Z5111 Encounter for antineoplastic chemotherapy: Secondary | ICD-10-CM | POA: Insufficient documentation

## 2019-12-21 DIAGNOSIS — F17210 Nicotine dependence, cigarettes, uncomplicated: Secondary | ICD-10-CM | POA: Insufficient documentation

## 2019-12-21 DIAGNOSIS — Z79899 Other long term (current) drug therapy: Secondary | ICD-10-CM | POA: Insufficient documentation

## 2019-12-21 DIAGNOSIS — K5903 Drug induced constipation: Secondary | ICD-10-CM | POA: Insufficient documentation

## 2019-12-22 ENCOUNTER — Other Ambulatory Visit: Payer: Self-pay | Admitting: Hematology

## 2019-12-22 ENCOUNTER — Telehealth: Payer: Self-pay | Admitting: Hematology

## 2019-12-22 MED ORDER — HYDROCODONE-ACETAMINOPHEN 5-325 MG PO TABS: 1.0000 | ORAL_TABLET | Freq: Four times a day (QID) | ORAL | 0 refills | Status: DC | PRN

## 2019-12-22 NOTE — Telephone Encounter (Signed)
Scheduled per 8/12 staff message. Spoke with pt's ex wife and is aware of appt time and date.

## 2019-12-25 NOTE — Progress Notes (Signed)
[name]   [phone] Fax:([phone]) [phone]   Clinic Follow up Note   Patient Care Team: Wannetta Sender, FNP as PCP - General (Family Medicine) Truitt Merle, MD as Consulting Physician (Hematology) Alla Feeling, NP as Nurse Practitioner (Nurse Practitioner) Stark Klein, MD as Consulting Physician (General Surgery) Rogene Houston, MD as Consulting Physician (Gastroenterology) Milus Banister, MD as Attending Physician (Gastroenterology) Karie Mainland, RD as Dietitian (Nutrition)  Date of Service:  12/27/2019  CHIEF COMPLAINT: F/u on gastric cancer and IDA  SUMMARY OF ONCOLOGIC HISTORY: Oncology History Overview Note  Cancer Staging Malignant neoplasm of body of stomach ([place]) Staging form: Stomach, AJCC 8th Edition - Clinical stage from 03/11/2017: Stage IIB (cT3, cN0, cM0) - Unsigned - Pathologic stage from 04/22/2017: Stage IIIB (pT4a, pN3a, cM0) - Signed by Alla Feeling, NP on 05/17/2017     Malignant neoplasm of body of stomach ([place])  02/17/2017 Initial Diagnosis   Malignant neoplasm of body of stomach ([place])   02/17/2017 Pathology Results   Diagnosis Stomach, biopsy, gastric ulcer - ADENOCARCINOMA. Microscopic Comment Several of the fragments are involved by moderately differentiated adenocarcinoma.   02/17/2017 Procedure   UPPER ENDOSCOPY  FINDINGS - Normal esophagus. - Z-line irregular, 44 cm from the incisors. - Red blood in the gastric body and in the gastric antrum. - Large gastric ulcer. Biopsied. - Erythematous mucosa in the antrum. - Normal cardia, gastric fundus, gastric body and pylorus. - Normal duodenal bulb and second portion of the duodenum. Comment: Endoscopic appearence concerning for malignant ulcer.   03/11/2017 Procedure   UPPER EUS PER DR. Ardis Hughs Findings: 1. The esophagus was normal. 2. There was a 2-3cm ulcerated, malignant mass along the greater curvature of the stomach, approximately  mid-body. The mass was non-circumferential. 3. The duodenum was normal. Endosonographic Finding 1. The gastric mass above correlated with a hypoechoic and heterogenous non-circumferential mass that measured 2.9cm across, 52m deep. The endosonographic borders were poorly defined and there was clear sonographic evidence suggesting invasion into and through the muscularis propria layer without evident invasion into nearby organs (uT3). 2. The duodenal lymphnode described on recent CT scan appeared reactive by [place]. 3. No perigastric adenopathy (uN0) 4. Limited views of the liver, spleen, pancreas, bile duct, gallbladder were all normal. - Along the greater curvature of the stomach, approximately mid-body, there is a 2.9cm uT3N0 (clinical stage IIB) gastric adenocarcinoma.   04/06/2017 Imaging   CT CHEST IMPRESSION: 1. No acute cardiopulmonary abnormalities. 2. Two small pulmonary nodules are noted measuring up to 5 mm. Nonspecific but warrant attention on follow-up imaging follow up 3. Nonspecific hyperdense, possibly enhancing lesion along the dome of liver is identified. In a patient that is at increased risk for more definitive assessment of this structure with contrast enhanced MRI of the liver is advised.   04/21/2017 Imaging   MR ABDOMEN IMPRESSION: 1. Enhancing 2.9 cm mass along the lesser curvature in the gastric antrum, compatible with known gastric malignancy . 2. Mildly enlarged gastrohepatic ligament lymph node, cannot exclude nodal metastasis. 3. No definite liver metastatic disease. Hyperenhancing 0.9 cm liver dome mass remains inconclusive, although the MRI features are most suggestive of a flash filling hemangioma, and the mass has been stable for nearly 2 months. Additional subcentimeter focus of hyperenhancement in the lateral segment left liver lobe is most likely a benign transient vascular phenomenon. Recommend attention to these lesions on a follow-up MRI  abdomen without and with IV contrast in  3-6 months. This recommendation follows ACR consensus guidelines: Management of Incidental Liver Lesions on CT: A White Paper of the ACR Incidental Findings Committee. J Am Coll Radiol 2017; 27:0350-0938. 4. Benign right adrenal adenoma.     04/22/2017 Pathology Results   Diagnosis 1. Liver, biopsy - BILE DUCT HAMARTOMA. - THERE IS NO EVIDENCE OF MALIGNANCY. 2. Lymph nodes, regional resection, portal - METASTATIC CARCINOMA IN 2 OF 6 LYMPH NODES (2/6). 3. Stomach, resection for tumor, distal - INVASIVE ADENOCARCINOMA, POORLY DIFFERENTIATED, SPANNING 3.8 CM. - PERINEURAL INVASION IS IDENTIFIED. - ADENOCARCINOMA INVOLVES THE SEROSA. - METASTATIC CARCINOMA IN 12 OF 30 LYMPH NODES (12/30), WITH EXTRACAPSULAR EXTENSION. - SEE ONCOLOGY TABLE BELOW. 4. Lymph node, biopsy, common hepatic artery - THERE IS NO EVIDENCE OF CARCINOMA IN 1 OF 1 LYMPH NODE (0/1). Microscopic Comment 3. STOMACH: Specimen: Stomach. Procedure: Partial gastrectomy. Tumor Site: Greater curvature. Tumor Size: 3.8 cm Histologic Type: Adenocarcinoma. Histologic Grade: G3: poorly differentiated. Microscopic Extent of Tumor: Adenocarcinoma involves the serosa. Margins (select all that apply): Adenoca Proximal Margin: Negative for adenocarcinoma. Distal Margin: Negative for adenocarcinoma. Treatment Effect: N/A Lymph-Vascular Invasion: Not identified. Perineural Invasion: Present. Additional findings: Chronic gastritis. Ancillary testing: Can be performed upon clinician request. 1 of 3 Duplicate copy FINAL for Gabriel, Poole ([id number]) Microscopic Comment(continued) Lymph nodes: number examined - 37; number positive: 14 Pathologic Staging: pT4a, pN3a (JBK:gt, 04/27/17)   06/02/2017 - 09/30/2017 Adjuvant Chemotherapy   FOLFOX every 2 weeks, oxaliplatin stopped in March 2019 due to neuropathy, chemo stopped on 09/30/2017 per pt's request    10/29/2017 Imaging    10/29/2017 CT CAP  IMPRESSION: 1. Status post distal gastrectomy. No evidence for metastatic disease in the chest, abdomen, or pelvis. 2. Stable tiny right pulmonary nodules. Continued attention on follow-up recommended. 3. 9 mm hypervascular lesion in the dome of the liver is stable over multiple studies back to 04/05/2017. Continued attention on follow-up suggested. 4. Ascending thoracic aorta measures 4.2 cm diameter. Recommend annual imaging followup by CTA or MRA. This recommendation follows 2010 ACCF/AHA/AATS/ACR/ASA/SCA/SCAI/SIR/STS/SVM Guidelines for the Diagnosis and Management of Patients with Thoracic Aortic Disease. Circulation. 2010; 121: R678-L381 . 5. Marked prostatomegaly. 6.  Aortic Atherosclerois (ICD10-170.0)   05/09/2018 Imaging   05/09/2018 CT CAP IMPRESSION: 1. No definite findings to suggest metastatic disease to the chest, abdomen or pelvis. 2. Multiple small pulmonary nodules scattered throughout the lungs bilaterally measuring 5 mm or less in size, stable compared to prior studies from 2018, favored to be benign. Continued attention on future follow-up examinations is recommended. 3. Aortic atherosclerosis with ectasia of the ascending thoracic aorta (4 cm in diameter). Recommend annual imaging followup by CTA or MRA. This recommendation follows 2010 ACCF/AHA/AATS/ACR/ASA/SCA/SCAI/SIR/STS/SVM Guidelines for the Diagnosis and Management of Patients with Thoracic Aortic Disease. Circulation. 2010; 121: O175-Z025. 4. Multiple small calculi lying dependently in the right-side of the urinary bladder. 5. Severe prostatomegaly. 6. Additional incidental findings, as above.   04/04/2019 Imaging   CT AP IMPRESSION: 1. Redemonstrated postoperative findings of distal gastrectomy and gastrojejunostomy (series 2, image 18, series 4, image 27). No evidence of malignant recurrence or metastatic disease in the abdomen or pelvis. 2. Subcutaneous body fat is  substantially diminished in comparison to prior examination, in keeping with reported history of weight loss. 3. Gross prostatomegaly and urinary bladder wall thickening, likely due to chronic outlet obstruction. 4.  Aortic Atherosclerosis (ICD10-I70.0).   08/16/2019 Imaging   CT CAP w contrast IMPRESSION: 1. No acute findings within the chest, abdomen or pelvis.  No specific findings identified to suggest residual or recurrent tumor or metastatic disease. 2. Small nonspecific pulmonary nodules are unchanged. 3. Marked enlargement of the prostate gland. 4. Multiple tiny bladder stones. 5. Aortic atherosclerosis. Aortic Atherosclerosis (ICD10-I70.0).   11/02/2019 Procedure   EGD impression - Normal hypopharynx. - Normal esophagus. - Z-line regular, 45 cm from the incisors. - Patent Billroth II gastrojejunostomy was found. 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 Biopsied. - Gastric ulcer with raised margins appears to be separate lesion. Biopsied.  comment: recurrent tumor at anastamosis appears to be separate form fundal lesion. these lesions are the source of chronic blood loss.  Colonoscopy impression: - Perianal skin tags found on perianal exam. - The entire examined colon is normal. - External and internal hemorrhoids. - No specimens collected.   11/02/2019 Pathology Results   FINAL MICROSCOPIC DIAGNOSIS:  A. STOMACH, BIOPSY:  -  Poorly differentiated carcinoma  -  See comment  B. STOMACH, FUNDUS, BIOPSY:  -  Adenocarcinoma  -  See comment  COMMENT:  A and B.  The carcinoma in part A is more poorly differentiated than  what is seen in part B but both are favored to be adenocarcinoma.  Dr.  Jeannie Done reviewed the case and agrees with the above diagnosis. Dr.  Laural Golden was notified of these results on November 03, 2019.    12/05/2019 PET scan   IMPRESSION: Marked hypermetabolic activity within the thickened stomach in this patient with known gastric cancer with associated  metastatic disease to the liver and small lymph nodes in the area of the hepatic gastric recess that are suspicious based on location and size though not particularly FDG avid.   Small lymph nodes along the posterolateral margin of the aorta with mild increased FDG uptake (image 107) (SUVmax = 2.8 these are approximately 6-7 mm and there are several small nodes tracking beneath the LEFT retrocrural region. Attention on follow-up)   Increased FDG uptake in the central portion of the prostate likely within the prosthetic urethra given the marked hypermetabolic features in the central location. Would however consider correlation with PSA as clinically warranted. Prostate is markedly enlarged otherwise with heterogeneous FDG uptake.   Aortic Atherosclerosis (ICD10-I70.0).    12/07/2019 -  Chemotherapy   FOLFOX every 2 weeks starting 12/07/19    12/14/2019 Pathology Results   FINAL MICROSCOPIC DIAGNOSIS:   A. LIVER, NEEDLE CORE BIOPSY:  - Poorly differentiated adenocarcinoma, see comment.   COMMENT:   The tumor has a similar appearance to the patient's recent gastric  biopsies ([id number]). There is sufficient material for additional  testing if requested.       CURRENT THERAPY:  FOLFOX every 2 weeks starting 12/07/19, will add Transtuzumab from cycle 3   INTERVAL HISTORY:  Gabriel Poole is here for a follow up and treatment. He presents to the clinic alone. He notes he is constipated. He has tried Exlax. He denies neuropathy or new pain. He uses pain medication every other day.    REVIEW OF SYSTEMS:   Constitutional: Denies fevers, chills or abnormal weight loss Eyes: Denies blurriness of vision Ears, nose, mouth, throat, and face: Denies mucositis or sore throat Respiratory: Denies cough, dyspnea or wheezes Cardiovascular: Denies palpitation, chest discomfort or lower extremity swelling Gastrointestinal:  Denies nausea, heartburn (+) Constipation Skin: Denies abnormal  skin rashes Lymphatics: Denies new lymphadenopathy or easy bruising Neurological:Denies numbness, tingling or new weaknesses Behavioral/Psych: Mood is stable, no new changes  All other systems were reviewed with the  patient and are negative.  MEDICAL HISTORY:  Past Medical History:  Diagnosis Date  . Arthritis   . Cancer [facility]) dx oct 02-2017   stomach  . Gout   . Heart murmur   . Hyperlipidemia   . Hypertension   . Stomach cancer [facility])     SURGICAL HISTORY: Past Surgical History:  Procedure Laterality Date  . BIOPSY  11/02/2019   Procedure: BIOPSY;  Surgeon: Rogene Houston, MD;  [facility];  Service: Endoscopy;;  gastric  . COLONOSCOPY    . COLONOSCOPY N/A 11/02/2019   Procedure: COLONOSCOPY;  Surgeon: Rogene Houston, MD;  [facility];  Service: Endoscopy;  Laterality: N/A;  100  . ESOPHAGOGASTRODUODENOSCOPY N/A 02/17/2017   Procedure: ESOPHAGOGASTRODUODENOSCOPY (EGD);  Surgeon: Rogene Houston, MD;  [facility];  Service: Endoscopy;  Laterality: N/A;  3:00  . ESOPHAGOGASTRODUODENOSCOPY N/A 11/02/2019   Procedure: ESOPHAGOGASTRODUODENOSCOPY (EGD);  Surgeon: Rogene Houston, MD;  [facility];  Service: Endoscopy;  Laterality: N/A;  . EUS N/A 03/11/2017   Procedure: UPPER ENDOSCOPIC ULTRASOUND (EUS) LINEAR;  Surgeon: Milus Banister, MD;  [facility];  Service: Endoscopy;  Laterality: N/A;  . EUS N/A 03/11/2017   Procedure: UPPER ENDOSCOPIC ULTRASOUND (EUS) RADIAL;  Surgeon: Milus Banister, MD;  [facility];  Service: Endoscopy;  Laterality: N/A;  . FINGER SURGERY     Lt middle   . GASTRECTOMY N/A 04/22/2017   Procedure: PARTIAL GASTRECTOMY;  Surgeon: Stark Klein, MD;  Location: [place];  Service: General;  Laterality: N/A;  . GASTROJEJUNOSTOMY N/A 04/22/2017   Procedure: JEJUNAL FEEDING TUBE PLACEMENT;  Surgeon: Stark Klein, MD;  Location: [place];  Service: General;  Laterality: N/A;  . HYDROCELE  EXCISION  02/12/2011   Procedure: HYDROCELECTOMY ADULT;  Surgeon: Marissa Nestle;  Location: [facility];  Service: Urology;  Laterality: Left;  . LAPAROSCOPY N/A 04/22/2017   Procedure: LAPAROSCOPY DIAGNOSTIC ERAS PATHWAY;  Surgeon: Stark Klein, MD;  Location: [place];  Service: General;  Laterality: N/A;  EPIDURAL  . PORTACATH PLACEMENT N/A 05/28/2017   Procedure: INSERTION PORT-A-CATH ERAS PATHWAY;  Surgeon: Stark Klein, MD;  Location: [place];  Service: General;  Laterality: N/A;    I have reviewed the social history and family history with the patient and they are unchanged from previous note.  ALLERGIES:  is allergic to penicillins.  MEDICATIONS:  Current Outpatient Medications  Medication Sig Dispense Refill  . amLODipine (NORVASC) 10 MG tablet Take 1 tablet (10 mg total) by mouth daily. 90 tablet 0  . atorvastatin (LIPITOR) 40 MG tablet Take 1 tablet (40 mg total) by mouth daily at 6 PM. 90 tablet 0  . Cholecalciferol (VITAMIN D3) 5000 units CAPS Take 5,000 Units by mouth daily.    . finasteride (PROSCAR) 5 MG tablet Take 5 mg by mouth daily.    Marland Kitchen HYDROcodone-acetaminophen (NORCO/VICODIN) 5-325 MG tablet Take 1 tablet by mouth every 6 (six) hours as needed for moderate pain. 30 tablet 0  . lidocaine-prilocaine (EMLA) cream Apply to affected area once 30 g 3  . megestrol (MEGACE ES) 625 MG/5ML suspension Take 5 mLs (625 mg total) by mouth daily. 150 mL 0  . ondansetron (ZOFRAN) 8 MG tablet Take 1 tablet (8 mg total) by mouth 2 (two) times daily as needed for refractory nausea / vomiting. Start on day 3 after chemotherapy. 30 tablet 1  . potassium chloride SA (K-DUR,KLOR-CON) 20 MEQ tablet Take 1 tablet (  20 mEq total) by mouth 2 (two) times daily. 40 tablet 1  . prochlorperazine (COMPAZINE) 10 MG tablet Take 1 tablet (10 mg total) by mouth every 6 (six) hours as needed (Nausea or vomiting). 30 tablet 1  . tadalafil (CIALIS) 5 MG tablet Take 5 mg by mouth daily.    .  tamsulosin (FLOMAX) 0.4 MG CAPS capsule Take 0.4 mg by mouth daily.    Marland Kitchen triamterene-hydrochlorothiazide (MAXZIDE-25) 37.5-25 MG tablet Take 1 tablet by mouth daily.     No current facility-administered medications for this visit.   Facility-Administered Medications Ordered in Other Visits  Medication Dose Route Frequency Provider Last Rate Last Admin  . fluorouracil (ADRUCIL) 4,150 mg in sodium chloride 0.9 % 67 mL chemo infusion  2,400 mg/m2 (Treatment Plan Recorded) Intravenous 1 day or 1 dose Truitt Merle, MD   4,150 mg at 12/27/19 1212  . sodium chloride flush (NS) 0.9 % injection 10 mL  10 mL Intracatheter PRN Truitt Merle, MD   10 mL at 12/27/19 1211    PHYSICAL EXAMINATION: ECOG PERFORMANCE STATUS: 1 - Symptomatic but completely ambulatory  There were no vitals filed for this visit. There were no vitals filed for this visit.  Due to [place] we will limit examination to appearance. Patient had no complaints.  GENERAL:alert, no distress and comfortable SKIN: skin color normal, no rashes or significant lesions EYES: normal, Conjunctiva are pink and non-injected, sclera clear  NEURO: alert & oriented x 3 with fluent speech    LABORATORY DATA:  I have reviewed the data as listed CBC Latest Ref Rng & Units 12/27/2019 12/14/2019 12/07/2019  WBC 4.0 - 10.5 K/uL 6.0 6.0 7.0  Hemoglobin 13.0 - 17.0 g/dL 10.9(L) 11.3(L) 10.9(L)  Hematocrit 39 - 52 % 34.3(L) 36.6(L) 34.5(L)  Platelets 150 - 400 K/uL 259 308 291     CMP Latest Ref Rng & Units 12/27/2019 12/07/2019 11/23/2019  Glucose 70 - 99 mg/dL 77 78 84  BUN 8 - 23 mg/dL _0 Creatinine 0.61 - 1.24 mg/dL 0.91 1.07 1.08  Sodium 135 - 145 mmol/L 139 139 140  Potassium 3.5 - 5.1 mmol/L 3.7 4.0 4.4  Chloride 98 - 111 mmol/L 109 110 110  CO2 22 - 32 mmol/L 20(L) 23 24  Calcium 8.9 - 10.3 mg/dL 9.4 9.5 8.8(L)  Total Protein 6.5 - 8.1 g/dL 5.9(L) 5.9(L) 5.5(L)  Total Bilirubin 0.3 - 1.2 mg/dL 0.4 0.5 0.3  Alkaline Phos 38 - 126 U/L 92 79  83  AST 15 - 41 U/L 13(L) 15 15  ALT 0 - 44 U/L _1 RADIOGRAPHIC STUDIES: I have personally reviewed the radiological images as listed and agreed with the findings in the report. No results found.   ASSESSMENT & PLAN:  KIRKLIN MCDUFFEE is a 74 y.o. male with    1. Primary adenocarcinoma of the pyloric antrum, pT4a, PN3aM0, stage IIIB, Grade3, MSI-stable, recurrence at the anastomosis and newadenocarcinomain the gastric fundus in 10/2019, oligo liver metastasis 11/2019, HER2(+)  -Initially diagnosed in 02/2017.S/psurgical resection and adjuvant chemotherapyFOLFOX. Patient was not compliant with chemo, oxaliplatin stopped after 4 cycles, and 5-FU stopped after 8 cycles treatment per his request. -He developed weight loss and recurrent IDA recently. His 10/2019 GI work up by Dr. Laural Golden and path shows recurrent poorly differentiatedadenocarcinomaat the anastomosis with additional focus of adeno in the gastric fundus.  -I previously discussed his case in GI Tumor Board and Dr. Barry Dienes did not offer  surgery.  -His PET from 12/05/19 showed known gastric cancer with associated metastatic disease to the liver and small lymph nodes in the area of the hepatic gastric recess.  -We discussed liver biopsy from 12/14/19 which shows metastatic cancer. I discussed this indicated a worse prognosis.  -His tumor is negative for PD-l1 and EBV, MMR still pending, HER2(+), so he is a candidate for trastuzumab, but not immunotherapy for now.  Will add Trastuzumab with next cycle chemo  -will get echo in next 1-2 weeks  -He started first line chemo FOLFOX q2weeks on 12/07/19. Goal of care is to control his disease and prolong his life.  -S/p C1 he tolerated lower dose well. He does have constipation from pain medication use. I reviewed Miralax use with him. No worsened neuropathy so far.  -Labs reviewed, CBC And CMP WNL except Hg 10.9, protein 5.9, Albumin 3.3. overall adequate to proceed with C2 FOLFOX  today at same dose.  -F/u in 2 weeks     2. Recurrent iron deficiency anemia, secondary to cancer recurrence -He initially had low serum iron and low transferrin saturation in 06/2017 after cancer resection; iron and Ferritin were normal in the interval. -He developed worsening anemia in 03/2019, Hg 10.2 on 11/19, ferritin 12, serum iron 21 and 6% transferrin saturation, consistent with iron deficiency -He has required blood transfusion on 08/03/19 and 10/25/19. He has received IV Feraheme 5 times since 08/08/19.  -GI work up in 11/02/19 with Dr Laural Golden showed this to be secondary to local cancer recurrence.  -Currently mild and stable anemia. Will monitor on chemo.  -Hg 10.9 today, ferritin 63, iron 31 today (12/27/19)  3. Weight loss  -secondary to cancer recurrence. -Recently he has not been eating as much as no one at his home cooks. He relies more on supplemental nutrition which is not enough. I recommend he eat more frequent small meals and increase nutritional supplements.  -Continue to f/u with dietician.   4.Ascendingaortic aneurysm -Monitored on surveillance scans for his gastric cancer  5. Smokingcessation -Hesmokes less now, 1 cigarette a day occasionally. -I advised him to completely stop smoking and offered joining a smoking cessation program, but states that he has transportation issue.  6. BPH and elevated PSA -He has nocturia and urinary frequency, history of BPH, and elevated PSA in July 2019 -He was previously seen by urologist before and had prostate shaved.  -PriorCT scan findings showed severe prostatomegaly. PET from 12/05/19 showed marked hypermetabolic uptake in this area. I will repeat PSA, will hold on biopsy for now due to his metastatic gastric cancer.   7. 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 This is currently manageable.  -Will monitor on restart of lower dose FOLFOX.  No progressive neuropathy o far on FOLFOX.   8. Abdominal Pain, secondary to #1, Constipation  -For abdominal pain he is currently on  -He has recent constipation and has been straining to have a BM, but able to pass stool.  -He tried Exlax with no help. I recommend he start Miralax up to have a soft BM.  -I recommend he drinks more water.   Plan -Labs reviewed and adequate to proceed with C2 FOLFOX at same dose.  -Lab, flush, f/u and FOLFOX and Transtuzumab in 2, 4, 6 weeks  -echo in 1-2 weeks     No problem-specific Assessment & Plan notes found for this encounter.   Orders Placed This Encounter  Procedures  .  ECHOCARDIOGRAM COMPLETE    Standing Status:   Future    Standing Expiration Date:   12/26/2020    Order Specific Question:   Where should this test be performed    Answer:   [place]    Order Specific Question:   Perflutren DEFINITY (image enhancing agent) should be administered unless hypersensitivity or allergy exist    Answer:   Administer Perflutren    Order Specific Question:   Is a special reader required? (athlete or structural heart)    Answer:   No    Order Specific Question:   Does this study need to be read by the Structural team/Level 3 readers?    Answer:   No    Order Specific Question:   Reason for exam-Echo    Answer:   Chemo  V67.2 / Z09   All questions were answered. The patient knows to call the clinic with any problems, questions or concerns. No barriers to learning was detected. The total time spent in the appointment was 30 minutes.     Truitt Merle, MD 12/27/2019   I, Joslyn Devon, am acting as scribe for Truitt Merle, MD.   I have reviewed the above documentation for accuracy and completeness, and I agree with the above.

## 2019-12-26 ENCOUNTER — Other Ambulatory Visit: Payer: Medicare HMO

## 2019-12-27 ENCOUNTER — Encounter: Payer: Self-pay | Admitting: Hematology

## 2019-12-27 ENCOUNTER — Other Ambulatory Visit: Payer: Medicare HMO

## 2019-12-27 ENCOUNTER — Inpatient Hospital Stay (HOSPITAL_BASED_OUTPATIENT_CLINIC_OR_DEPARTMENT_OTHER): Payer: Medicare HMO | Admitting: Hematology

## 2019-12-27 ENCOUNTER — Inpatient Hospital Stay: Payer: Medicare HMO

## 2019-12-27 ENCOUNTER — Telehealth: Payer: Self-pay

## 2019-12-27 ENCOUNTER — Ambulatory Visit: Payer: Medicare HMO | Admitting: Hematology

## 2019-12-27 ENCOUNTER — Other Ambulatory Visit: Payer: Self-pay

## 2019-12-27 VITALS — BP 147/73 | HR 61 | Temp 98.4°F | Resp 18

## 2019-12-27 DIAGNOSIS — D509 Iron deficiency anemia, unspecified: Secondary | ICD-10-CM | POA: Diagnosis not present

## 2019-12-27 DIAGNOSIS — R634 Abnormal weight loss: Secondary | ICD-10-CM | POA: Diagnosis not present

## 2019-12-27 DIAGNOSIS — K5903 Drug induced constipation: Secondary | ICD-10-CM | POA: Diagnosis not present

## 2019-12-27 DIAGNOSIS — C162 Malignant neoplasm of body of stomach: Secondary | ICD-10-CM

## 2019-12-27 DIAGNOSIS — D649 Anemia, unspecified: Secondary | ICD-10-CM

## 2019-12-27 DIAGNOSIS — G629 Polyneuropathy, unspecified: Secondary | ICD-10-CM | POA: Diagnosis not present

## 2019-12-27 DIAGNOSIS — Z79899 Other long term (current) drug therapy: Secondary | ICD-10-CM | POA: Diagnosis not present

## 2019-12-27 DIAGNOSIS — C787 Secondary malignant neoplasm of liver and intrahepatic bile duct: Secondary | ICD-10-CM | POA: Diagnosis not present

## 2019-12-27 DIAGNOSIS — Z7189 Other specified counseling: Secondary | ICD-10-CM

## 2019-12-27 DIAGNOSIS — F17210 Nicotine dependence, cigarettes, uncomplicated: Secondary | ICD-10-CM | POA: Diagnosis not present

## 2019-12-27 DIAGNOSIS — Z5111 Encounter for antineoplastic chemotherapy: Secondary | ICD-10-CM | POA: Diagnosis not present

## 2019-12-27 DIAGNOSIS — C163 Malignant neoplasm of pyloric antrum: Secondary | ICD-10-CM | POA: Diagnosis present

## 2019-12-27 LAB — CBC WITH DIFFERENTIAL (CANCER CENTER ONLY)
Abs Immature Granulocytes: 0.02 10*3/uL (ref 0.00–0.07)
Basophils Absolute: 0 10*3/uL (ref 0.0–0.1)
Basophils Relative: 1 %
Eosinophils Absolute: 0 10*3/uL (ref 0.0–0.5)
Eosinophils Relative: 0 %
HCT: 34.3 % — ABNORMAL LOW (ref 39.0–52.0)
Hemoglobin: 10.9 g/dL — ABNORMAL LOW (ref 13.0–17.0)
Immature Granulocytes: 0 %
Lymphocytes Relative: 15 %
Lymphs Abs: 0.9 10*3/uL (ref 0.7–4.0)
MCH: 27.9 pg (ref 26.0–34.0)
MCHC: 31.8 g/dL (ref 30.0–36.0)
MCV: 87.7 fL (ref 80.0–100.0)
Monocytes Absolute: 0.5 10*3/uL (ref 0.1–1.0)
Monocytes Relative: 9 %
Neutro Abs: 4.5 10*3/uL (ref 1.7–7.7)
Neutrophils Relative %: 75 %
Platelet Count: 259 10*3/uL (ref 150–400)
RBC: 3.91 MIL/uL — ABNORMAL LOW (ref 4.22–5.81)
RDW: 15.2 % (ref 11.5–15.5)
WBC Count: 6 10*3/uL (ref 4.0–10.5)
nRBC: 0 % (ref 0.0–0.2)

## 2019-12-27 LAB — CMP (CANCER CENTER ONLY)
ALT: 8 U/L (ref 0–44)
AST: 13 U/L — ABNORMAL LOW (ref 15–41)
Albumin: 3.3 g/dL — ABNORMAL LOW (ref 3.5–5.0)
Alkaline Phosphatase: 92 U/L (ref 38–126)
Anion gap: 10 (ref 5–15)
BUN: 13 mg/dL (ref 8–23)
CO2: 20 mmol/L — ABNORMAL LOW (ref 22–32)
Calcium: 9.4 mg/dL (ref 8.9–10.3)
Chloride: 109 mmol/L (ref 98–111)
Creatinine: 0.91 mg/dL (ref 0.61–1.24)
GFR, Est AFR Am: >60 mL/min (ref >60)
GFR, Estimated: >60 mL/min (ref >60)
Glucose, Bld: 77 mg/dL (ref 70–99)
Potassium: 3.7 mmol/L (ref 3.5–5.1)
Sodium: 139 mmol/L (ref 135–145)
Total Bilirubin: 0.4 mg/dL (ref 0.3–1.2)
Total Protein: 5.9 g/dL — ABNORMAL LOW (ref 6.5–8.1)

## 2019-12-27 LAB — IRON AND TIBC
Iron: 31 ug/dL — ABNORMAL LOW (ref 42–163)
Saturation Ratios: 11 % — ABNORMAL LOW (ref 20–55)
TIBC: 274 ug/dL (ref 202–409)
UIBC: 243 ug/dL (ref 117–376)

## 2019-12-27 LAB — FERRITIN: Ferritin: 63 ng/mL (ref 24–336)

## 2019-12-27 MED ORDER — LEUCOVORIN CALCIUM INJECTION 350 MG
400.0000 mg/m2 | Freq: Once | INTRAVENOUS | Status: AC
  Administered 2019-12-27: 692 mg via INTRAVENOUS
  Filled 2019-12-27: qty 34.6

## 2019-12-27 MED ORDER — OXALIPLATIN CHEMO INJECTION 100 MG/20ML
70.0000 mg/m2 | Freq: Once | INTRAVENOUS | Status: AC
  Administered 2019-12-27: 120 mg via INTRAVENOUS
  Filled 2019-12-27: qty 20

## 2019-12-27 MED ORDER — SODIUM CHLORIDE 0.9 % IV SOLN
2400.0000 mg/m2 | INTRAVENOUS | Status: DC
  Administered 2019-12-27: 4150 mg via INTRAVENOUS
  Filled 2019-12-27: qty 83

## 2019-12-27 MED ORDER — PALONOSETRON HCL INJECTION 0.25 MG/5ML
0.2500 mg | Freq: Once | INTRAVENOUS | Status: AC
  Administered 2019-12-27: 0.25 mg via INTRAVENOUS

## 2019-12-27 MED ORDER — SODIUM CHLORIDE 0.9 % IV SOLN
10.0000 mg | Freq: Once | INTRAVENOUS | Status: AC
  Administered 2019-12-27: 10 mg via INTRAVENOUS
  Filled 2019-12-27: qty 1
  Filled 2019-12-27: qty 10

## 2019-12-27 MED ORDER — DEXTROSE 5 % IV SOLN
Freq: Once | INTRAVENOUS | Status: AC
  Filled 2019-12-27: qty 250

## 2019-12-27 MED ORDER — SODIUM CHLORIDE 0.9% FLUSH
10.0000 mL | INTRAVENOUS | Status: DC | PRN
  Administered 2019-12-27: 10 mL
  Filled 2019-12-27: qty 10

## 2019-12-27 MED ORDER — PALONOSETRON HCL INJECTION 0.25 MG/5ML
INTRAVENOUS | Status: AC
  Filled 2019-12-27: qty 5

## 2019-12-27 NOTE — Telephone Encounter (Signed)
Gabriel Poole returned my call.  I explained Dr Burr Medico wants to start [facility] on trastuzumab and that he needs and echocardiogram.  I reviewed the appointment date and time.  I also told her he needs to start miralax twice daily for his constipation. She verbalized understanding.

## 2019-12-27 NOTE — Telephone Encounter (Signed)
Per Dr. Burr Medico Mr Gabriel Poole is going to start traztusumab.  He needs a baseline echo done.  This is scheduled for 01/03/2020 at 0900.  I left a vm for Ms [name] Kubitz to call me so I can review this with her. [name] arranges transportation for [name].

## 2019-12-27 NOTE — Patient Instructions (Signed)
[facility] Discharge Instructions for Patients Receiving Chemotherapy  Today you received the following chemotherapy agents: Oxaliplatin, Leucovorin, and Fluorouracil  To help prevent nausea and vomiting after your treatment, we encourage you to take your nausea medication  as prescribed.    If you develop nausea and vomiting that is not controlled by your nausea medication, call the clinic.   BELOW ARE SYMPTOMS THAT SHOULD BE REPORTED IMMEDIATELY:  *FEVER GREATER THAN 100.5 F  *CHILLS WITH OR WITHOUT FEVER  NAUSEA AND VOMITING THAT IS NOT CONTROLLED WITH YOUR NAUSEA MEDICATION  *UNUSUAL SHORTNESS OF BREATH  *UNUSUAL BRUISING OR BLEEDING  TENDERNESS IN MOUTH AND THROAT WITH OR WITHOUT PRESENCE OF ULCERS  *URINARY PROBLEMS  *BOWEL PROBLEMS  UNUSUAL RASH Items with * indicate a potential emergency and should be followed up as soon as possible.  Feel free to call the clinic should you have any questions or concerns. The clinic phone number is [phone].  Please show the CHEMO ALERT CARD at check-in to the Emergency Department and triage nurse.   

## 2019-12-29 ENCOUNTER — Other Ambulatory Visit: Payer: Self-pay

## 2019-12-29 ENCOUNTER — Telehealth: Payer: Self-pay | Admitting: Hematology

## 2019-12-29 ENCOUNTER — Inpatient Hospital Stay: Payer: Medicare HMO

## 2019-12-29 VITALS — BP 152/76 | HR 54 | Temp 98.5°F | Resp 18

## 2019-12-29 DIAGNOSIS — Z7189 Other specified counseling: Secondary | ICD-10-CM

## 2019-12-29 DIAGNOSIS — C162 Malignant neoplasm of body of stomach: Secondary | ICD-10-CM

## 2019-12-29 DIAGNOSIS — Z5111 Encounter for antineoplastic chemotherapy: Secondary | ICD-10-CM | POA: Diagnosis not present

## 2019-12-29 MED ORDER — HEPARIN SOD (PORK) LOCK FLUSH 100 UNIT/ML IV SOLN
500.0000 [IU] | Freq: Once | INTRAVENOUS | Status: AC | PRN
  Administered 2019-12-29: 500 [IU]
  Filled 2019-12-29: qty 5

## 2019-12-29 MED ORDER — SODIUM CHLORIDE 0.9% FLUSH
10.0000 mL | INTRAVENOUS | Status: DC | PRN
  Administered 2019-12-29: 10 mL
  Filled 2019-12-29: qty 10

## 2019-12-29 NOTE — Telephone Encounter (Signed)
Scheduled per 8/18 los. Secure chat RN Janifer Adie to get some clarification on los. Unable to reach pt's ex wife. Left voicemail with appt time and date.

## 2019-12-29 NOTE — Patient Instructions (Signed)

## 2020-01-03 ENCOUNTER — Other Ambulatory Visit: Payer: Self-pay

## 2020-01-03 ENCOUNTER — Ambulatory Visit (HOSPITAL_COMMUNITY)
Admission: RE | Admit: 2020-01-03 | Discharge: 2020-01-03 | Disposition: A | Discharge status: Home / Self Care | Payer: Medicare HMO | Source: Ambulatory Visit | Attending: Hematology | Admitting: Hematology

## 2020-01-03 DIAGNOSIS — I119 Hypertensive heart disease without heart failure: Secondary | ICD-10-CM | POA: Diagnosis not present

## 2020-01-03 DIAGNOSIS — I517 Cardiomegaly: Secondary | ICD-10-CM | POA: Diagnosis not present

## 2020-01-03 DIAGNOSIS — C162 Malignant neoplasm of body of stomach: Secondary | ICD-10-CM

## 2020-01-03 DIAGNOSIS — E785 Hyperlipidemia, unspecified: Secondary | ICD-10-CM | POA: Insufficient documentation

## 2020-01-03 DIAGNOSIS — Z87891 Personal history of nicotine dependence: Secondary | ICD-10-CM | POA: Diagnosis not present

## 2020-01-03 LAB — ECHOCARDIOGRAM COMPLETE
Area-P 1/2: 2.91 cm2
S' Lateral: 2.6 cm

## 2020-01-03 NOTE — Progress Notes (Signed)
  Echocardiogram 2D Echocardiogram has been performed.  Jannett Celestine 01/03/2020, 9:57 AM

## 2020-01-04 ENCOUNTER — Other Ambulatory Visit: Payer: Medicare HMO

## 2020-01-04 ENCOUNTER — Ambulatory Visit: Payer: Medicare HMO | Admitting: Hematology

## 2020-01-04 ENCOUNTER — Ambulatory Visit: Payer: Medicare HMO

## 2020-01-04 ENCOUNTER — Encounter: Payer: Medicare HMO | Admitting: Nutrition

## 2020-01-05 ENCOUNTER — Other Ambulatory Visit: Payer: Self-pay | Admitting: Hematology

## 2020-01-05 MED ORDER — HYDROCODONE-ACETAMINOPHEN 5-325 MG PO TABS
1.0000 | ORAL_TABLET | Freq: Four times a day (QID) | ORAL | 0 refills | Status: DC | PRN
Start: 2020-01-05 — End: 2020-02-07

## 2020-01-09 MED FILL — Dexamethasone Sodium Phosphate Inj 100 MG/10ML: INTRAMUSCULAR | Qty: 1 | Status: AC

## 2020-01-10 ENCOUNTER — Telehealth: Payer: Self-pay | Admitting: Hematology

## 2020-01-10 ENCOUNTER — Inpatient Hospital Stay: Payer: Medicare HMO | Admitting: Hematology

## 2020-01-10 ENCOUNTER — Inpatient Hospital Stay: Payer: Medicare HMO | Admitting: Nutrition

## 2020-01-10 ENCOUNTER — Inpatient Hospital Stay: Payer: Medicare HMO

## 2020-01-10 NOTE — Telephone Encounter (Signed)
Called patient in regards to appt time at 0900. Patient didn't answer, message was left requesting that he give [place] a call back to either R/S or to inform us of him coming in.  Georgina Snell

## 2020-01-11 ENCOUNTER — Encounter: Payer: Medicare HMO | Admitting: Nutrition

## 2020-01-11 ENCOUNTER — Ambulatory Visit: Payer: Medicare HMO | Admitting: Nurse Practitioner

## 2020-01-11 ENCOUNTER — Other Ambulatory Visit: Payer: Medicare HMO

## 2020-01-11 ENCOUNTER — Ambulatory Visit: Payer: Medicare HMO

## 2020-01-12 ENCOUNTER — Inpatient Hospital Stay: Payer: Medicare HMO

## 2020-01-16 ENCOUNTER — Encounter (HOSPITAL_COMMUNITY): Payer: Self-pay | Admitting: Hematology

## 2020-01-17 ENCOUNTER — Other Ambulatory Visit: Payer: Self-pay | Admitting: Hematology

## 2020-01-18 ENCOUNTER — Other Ambulatory Visit: Payer: Medicare HMO

## 2020-01-18 ENCOUNTER — Encounter: Payer: Medicare HMO | Admitting: Nutrition

## 2020-01-18 ENCOUNTER — Ambulatory Visit: Payer: Medicare HMO | Admitting: Nurse Practitioner

## 2020-01-18 ENCOUNTER — Ambulatory Visit: Payer: Medicare HMO

## 2020-01-19 ENCOUNTER — Telehealth: Payer: Self-pay | Admitting: Hematology

## 2020-01-19 NOTE — Telephone Encounter (Signed)
Left message on pt's wife voicemail to remind pt's about upcomming appts on 9/15 and 9/29. Unable to reach pt. Left voicemail with all appt times and dates.

## 2020-01-19 NOTE — Telephone Encounter (Signed)
Filled

## 2020-01-23 ENCOUNTER — Other Ambulatory Visit: Payer: Self-pay | Admitting: Hematology

## 2020-01-23 NOTE — Progress Notes (Addendum)
Gabriel Poole   [phone] [phone]) [phone]   Clinic Follow up Note   Patient Care Team: Wannetta Sender, FNP as PCP - General (Family Medicine) Truitt Merle, MD as Consulting Physician (Hematology) Alla Feeling, NP as Nurse Practitioner (Nurse Practitioner) Stark Klein, MD as Consulting Physician (General Surgery) Rogene Houston, MD as Consulting Physician (Gastroenterology) Milus Banister, MD as Attending Physician (Gastroenterology) Karie Mainland, RD as Dietitian (Nutrition) 01/24/2020  CHIEF COMPLAINT: Follow-up recurrent gastric cancer and IDA  SUMMARY OF ONCOLOGIC HISTORY: Oncology History Overview Note  Cancer Staging Malignant neoplasm of body of stomach ([place]) Staging form: Stomach, AJCC 8th Edition - Clinical stage from 03/11/2017: Stage IIB (cT3, cN0, cM0) - Unsigned - Pathologic stage from 04/22/2017: Stage IIIB (pT4a, pN3a, cM0) - Signed by Alla Feeling, NP on 05/17/2017     Malignant neoplasm of body of stomach ([place])  02/17/2017 Initial Diagnosis   Malignant neoplasm of body of stomach ([place])   02/17/2017 Pathology Results   Diagnosis Stomach, biopsy, gastric ulcer - ADENOCARCINOMA. Microscopic Comment Several of the fragments are involved by moderately differentiated adenocarcinoma.   02/17/2017 Procedure   UPPER ENDOSCOPY  FINDINGS - Normal esophagus. - Z-line irregular, 44 cm from the incisors. - Red blood in the gastric body and in the gastric antrum. - Large gastric ulcer. Biopsied. - Erythematous mucosa in the antrum. - Normal cardia, gastric fundus, gastric body and pylorus. - Normal duodenal bulb and second portion of the duodenum. Comment: Endoscopic appearence concerning for malignant ulcer.   03/11/2017 Procedure   UPPER EUS PER DR. Ardis Hughs Findings: 1. The esophagus was normal. 2. There was a 2-3cm ulcerated, malignant mass along the greater curvature of the stomach, approximately mid-body. The  mass was non-circumferential. 3. The duodenum was normal. Endosonographic Finding 1. The gastric mass above correlated with a hypoechoic and heterogenous non-circumferential mass that measured 2.9cm across, 104m deep. The endosonographic borders were poorly defined and there was clear sonographic evidence suggesting invasion into and through the muscularis propria layer without evident invasion into nearby organs (uT3). 2. The duodenal lymphnode described on recent CT scan appeared reactive by [place]. 3. No perigastric adenopathy (uN0) 4. Limited views of the liver, spleen, pancreas, bile duct, gallbladder were all normal. - Along the greater curvature of the stomach, approximately mid-body, there is a 2.9cm uT3N0 (clinical stage IIB) gastric adenocarcinoma.   04/06/2017 Imaging   CT CHEST IMPRESSION: 1. No acute cardiopulmonary abnormalities. 2. Two small pulmonary nodules are noted measuring up to 5 mm. Nonspecific but warrant attention on follow-up imaging follow up 3. Nonspecific hyperdense, possibly enhancing lesion along the dome of liver is identified. In a patient that is at increased risk for more definitive assessment of this structure with contrast enhanced MRI of the liver is advised.   04/21/2017 Imaging   MR ABDOMEN IMPRESSION: 1. Enhancing 2.9 cm mass along the lesser curvature in the gastric antrum, compatible with known gastric malignancy . 2. Mildly enlarged gastrohepatic ligament lymph node, cannot exclude nodal metastasis. 3. No definite liver metastatic disease. Hyperenhancing 0.9 cm liver dome mass remains inconclusive, although the MRI features are most suggestive of a flash filling hemangioma, and the mass has been stable for nearly 2 months. Additional subcentimeter focus of hyperenhancement in the lateral segment left liver lobe is most likely a benign transient vascular phenomenon. Recommend attention to these lesions on a follow-up MRI abdomen without  and with IV contrast in 3-6 months. This recommendation follows  ACR consensus guidelines: Management of Incidental Liver Lesions on CT: A White Paper of the ACR Incidental Findings Committee. J Am Coll Radiol 2017; 63:8756-4332. 4. Benign right adrenal adenoma.     04/22/2017 Pathology Results   Diagnosis 1. Liver, biopsy - BILE DUCT HAMARTOMA. - THERE IS NO EVIDENCE OF MALIGNANCY. 2. Lymph nodes, regional resection, portal - METASTATIC CARCINOMA IN 2 OF 6 LYMPH NODES (2/6). 3. Stomach, resection for tumor, distal - INVASIVE ADENOCARCINOMA, POORLY DIFFERENTIATED, SPANNING 3.8 CM. - PERINEURAL INVASION IS IDENTIFIED. - ADENOCARCINOMA INVOLVES THE SEROSA. - METASTATIC CARCINOMA IN 12 OF 30 LYMPH NODES (12/30), WITH EXTRACAPSULAR EXTENSION. - SEE ONCOLOGY TABLE BELOW. 4. Lymph node, biopsy, common hepatic artery - THERE IS NO EVIDENCE OF CARCINOMA IN 1 OF 1 LYMPH NODE (0/1). Microscopic Comment 3. STOMACH: Specimen: Stomach. Procedure: Partial gastrectomy. Tumor Site: Greater curvature. Tumor Size: 3.8 cm Histologic Type: Adenocarcinoma. Histologic Grade: G3: poorly differentiated. Microscopic Extent of Tumor: Adenocarcinoma involves the serosa. Margins (select all that apply): Adenoca Proximal Margin: Negative for adenocarcinoma. Distal Margin: Negative for adenocarcinoma. Treatment Effect: N/A Lymph-Vascular Invasion: Not identified. Perineural Invasion: Present. Additional findings: Chronic gastritis. Ancillary testing: Can be performed upon clinician request. 1 of 3 Duplicate copy FINAL for Gabriel Poole, Gabriel Poole ([id number]) Microscopic Comment(continued) Lymph nodes: number examined - 37; number positive: 14 Pathologic Staging: pT4a, pN3a (JBK:gt, 04/27/17)   06/02/2017 - 09/30/2017 Adjuvant Chemotherapy   FOLFOX every 2 weeks, oxaliplatin stopped in March 2019 due to neuropathy, chemo stopped on 09/30/2017 per pt's request    10/29/2017 Imaging   10/29/2017 CT CAP   IMPRESSION: 1. Status post distal gastrectomy. No evidence for metastatic disease in the chest, abdomen, or pelvis. 2. Stable tiny right pulmonary nodules. Continued attention on follow-up recommended. 3. 9 mm hypervascular lesion in the dome of the liver is stable over multiple studies back to 04/05/2017. Continued attention on follow-up suggested. 4. Ascending thoracic aorta measures 4.2 cm diameter. Recommend annual imaging followup by CTA or MRA. This recommendation follows 2010 ACCF/AHA/AATS/ACR/ASA/SCA/SCAI/SIR/STS/SVM Guidelines for the Diagnosis and Management of Patients with Thoracic Aortic Disease. Circulation. 2010; 121: A630-Z601 . 5. Marked prostatomegaly. 6.  Aortic Atherosclerois (ICD10-170.0)   05/09/2018 Imaging   05/09/2018 CT CAP IMPRESSION: 1. No definite findings to suggest metastatic disease to the chest, abdomen or pelvis. 2. Multiple small pulmonary nodules scattered throughout the lungs bilaterally measuring 5 mm or less in size, stable compared to prior studies from 2018, favored to be benign. Continued attention on future follow-up examinations is recommended. 3. Aortic atherosclerosis with ectasia of the ascending thoracic aorta (4 cm in diameter). Recommend annual imaging followup by CTA or MRA. This recommendation follows 2010 ACCF/AHA/AATS/ACR/ASA/SCA/SCAI/SIR/STS/SVM Guidelines for the Diagnosis and Management of Patients with Thoracic Aortic Disease. Circulation. 2010; 121: U932-T557. 4. Multiple small calculi lying dependently in the right-side of the urinary bladder. 5. Severe prostatomegaly. 6. Additional incidental findings, as above.   04/04/2019 Imaging   CT AP IMPRESSION: 1. Redemonstrated postoperative findings of distal gastrectomy and gastrojejunostomy (series 2, image 18, series 4, image 27). No evidence of malignant recurrence or metastatic disease in the abdomen or pelvis. 2. Subcutaneous body fat is substantially diminished  in comparison to prior examination, in keeping with reported history of weight loss. 3. Gross prostatomegaly and urinary bladder wall thickening, likely due to chronic outlet obstruction. 4.  Aortic Atherosclerosis (ICD10-I70.0).   08/16/2019 Imaging   CT CAP w contrast IMPRESSION: 1. No acute findings within the chest, abdomen or pelvis. No specific findings identified to  suggest residual or recurrent tumor or metastatic disease. 2. Small nonspecific pulmonary nodules are unchanged. 3. Marked enlargement of the prostate gland. 4. Multiple tiny bladder stones. 5. Aortic atherosclerosis. Aortic Atherosclerosis (ICD10-I70.0).   11/02/2019 Procedure   EGD impression - Normal hypopharynx. - Normal esophagus. - Z-line regular, 45 cm from the incisors. - Patent Billroth II gastrojejunostomy was found. 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 Biopsied. - Gastric ulcer with raised margins appears to be separate lesion. Biopsied.  comment: recurrent tumor at anastamosis appears to be separate form fundal lesion. these lesions are the source of chronic blood loss.  Colonoscopy impression: - Perianal skin tags found on perianal exam. - The entire examined colon is normal. - External and internal hemorrhoids. - No specimens collected.   11/02/2019 Pathology Results   FINAL MICROSCOPIC DIAGNOSIS:  A. STOMACH, BIOPSY:  -  Poorly differentiated carcinoma  -  See comment  B. STOMACH, FUNDUS, BIOPSY:  -  Adenocarcinoma  -  See comment  COMMENT:  A and B.  The carcinoma in part A is more poorly differentiated than  what is seen in part B but both are favored to be adenocarcinoma.  Dr.  Jeannie Poole reviewed the case and agrees with the above diagnosis. Dr.  Laural Poole was notified of these results on November 03, 2019.    12/05/2019 PET scan   IMPRESSION: Marked hypermetabolic activity within the thickened stomach in this patient with known gastric cancer with associated metastatic disease to  the liver and small lymph nodes in the area of the hepatic gastric recess that are suspicious based on location and size though not particularly FDG avid.   Small lymph nodes along the posterolateral margin of the aorta with mild increased FDG uptake (image 107) (SUVmax = 2.8 these are approximately 6-7 mm and there are several small nodes tracking beneath the LEFT retrocrural region. Attention on follow-up)   Increased FDG uptake in the central portion of the prostate likely within the prosthetic urethra given the marked hypermetabolic features in the central location. Would however consider correlation with PSA as clinically warranted. Prostate is markedly enlarged otherwise with heterogeneous FDG uptake.   Aortic Atherosclerosis (ICD10-I70.0).    12/07/2019 -  Chemotherapy   FOLFOX every 2 weeks starting 12/07/19    12/14/2019 Pathology Results   FINAL MICROSCOPIC DIAGNOSIS:   A. LIVER, NEEDLE CORE BIOPSY:  - Poorly differentiated adenocarcinoma, see comment.   COMMENT:   The tumor has a similar appearance to the patient's recent gastric  biopsies ([id number]). There is sufficient material for additional  testing if requested.    01/24/2020 -  Chemotherapy   The patient had pembrolizumab (KEYTRUDA) 400 mg in sodium chloride 0.9 % 50 mL chemo infusion, 400 mg (100 % of original dose 400 mg), Intravenous, Once, 1 of 6 cycles Dose modification: 400 mg (original dose 400 mg, Cycle 1, Reason: Provider Judgment)  for chemotherapy treatment.      CURRENT THERAPY:  FOLFOX every 2 weeks starting 12/07/19, will add Transtuzumab from cycle 3   INTERVAL HISTORY: Gabriel Poole returns for follow-up and treatment as scheduled.  He received cycle 2 FOLFOX on 12/27/2019.  The plan is to add trastuzumab today.  He had a baseline echo on 8/25 that showed EF 60-65%.  The cold sensitivity last 2 weeks, without residual neuropathy in the absence of cold exposure.  His appetite is low and he continues  to lose weight.  He drinks supplements usually once daily.  He does not appear  to be taking Megace.  He has what he describes as diet-related vomiting if he eats too late at night and occasionally during the day.  He is out of antiemetics.  He also notes he has occasional postprandial pain and takes "a long time to digest food."  He is followed by dietitian.  He has a bowel movement every 2 days with laxative.  Denies bloody or black stool.  Denies mucositis.  Denies recent fever, chills, cough, chest pain, dyspnea, leg edema, or new concerns.    MEDICAL HISTORY:  Past Medical History:  Diagnosis Date  . Arthritis   . Cancer [facility]) dx oct 02-2017   stomach  . Gout   . Heart murmur   . Hyperlipidemia   . Hypertension   . Stomach cancer [facility])     SURGICAL HISTORY: Past Surgical History:  Procedure Laterality Date  . BIOPSY  11/02/2019   Procedure: BIOPSY;  Surgeon: Rogene Houston, MD;  [facility];  Service: Endoscopy;;  gastric  . COLONOSCOPY    . COLONOSCOPY N/A 11/02/2019   Procedure: COLONOSCOPY;  Surgeon: Rogene Houston, MD;  [facility];  Service: Endoscopy;  Laterality: N/A;  100  . ESOPHAGOGASTRODUODENOSCOPY N/A 02/17/2017   Procedure: ESOPHAGOGASTRODUODENOSCOPY (EGD);  Surgeon: Rogene Houston, MD;  [facility];  Service: Endoscopy;  Laterality: N/A;  3:00  . ESOPHAGOGASTRODUODENOSCOPY N/A 11/02/2019   Procedure: ESOPHAGOGASTRODUODENOSCOPY (EGD);  Surgeon: Rogene Houston, MD;  [facility];  Service: Endoscopy;  Laterality: N/A;  . EUS N/A 03/11/2017   Procedure: UPPER ENDOSCOPIC ULTRASOUND (EUS) LINEAR;  Surgeon: Milus Banister, MD;  [facility];  Service: Endoscopy;  Laterality: N/A;  . EUS N/A 03/11/2017   Procedure: UPPER ENDOSCOPIC ULTRASOUND (EUS) RADIAL;  Surgeon: Milus Banister, MD;  Location[facility];  Service: Endoscopy;  Laterality: N/A;  . FINGER SURGERY     Lt middle   . GASTRECTOMY N/A  04/22/2017   Procedure: PARTIAL GASTRECTOMY;  Surgeon: Stark Klein, MD;  Location: [place];  Service: General;  Laterality: N/A;  . GASTROJEJUNOSTOMY N/A 04/22/2017   Procedure: JEJUNAL FEEDING TUBE PLACEMENT;  Surgeon: Stark Klein, MD;  Location: [place];  Service: General;  Laterality: N/A;  . HYDROCELE EXCISION  02/12/2011   Procedure: HYDROCELECTOMY ADULT;  Surgeon: Marissa Nestle;  Location: [facility];  Service: Urology;  Laterality: Left;  . LAPAROSCOPY N/A 04/22/2017   Procedure: LAPAROSCOPY DIAGNOSTIC ERAS PATHWAY;  Surgeon: Stark Klein, MD;  Location: [place];  Service: General;  Laterality: N/A;  EPIDURAL  . PORTACATH PLACEMENT N/A 05/28/2017   Procedure: INSERTION PORT-A-CATH ERAS PATHWAY;  Surgeon: Stark Klein, MD;  Location: [place];  Service: General;  Laterality: N/A;    I have reviewed the social history and family history with the patient and they are unchanged from previous note.  ALLERGIES:  is allergic to penicillins.  MEDICATIONS:  Current Outpatient Medications  Medication Sig Dispense Refill  . amLODipine (NORVASC) 10 MG tablet Take 1 tablet (10 mg total) by mouth daily. 90 tablet 0  . atorvastatin (LIPITOR) 40 MG tablet Take 1 tablet (40 mg total) by mouth daily at 6 PM. 90 tablet 0  . Cholecalciferol (VITAMIN D3) 5000 units CAPS Take 5,000 Units by mouth daily.    . finasteride (PROSCAR) 5 MG tablet Take 5 mg by mouth daily.    Marland Kitchen HYDROcodone-acetaminophen (NORCO/VICODIN) 5-325 MG tablet Take 1 tablet by mouth every 6 (six) hours as needed  for moderate pain. 60 tablet 0  . lidocaine-prilocaine (EMLA) cream Apply to affected area once 30 g 3  . megestrol (MEGACE ES) 625 MG/5ML suspension Take 5 mLs (625 mg total) by mouth daily. 150 mL 0  . ondansetron (ZOFRAN) 8 MG tablet Take 1 tablet (8 mg total) by mouth 2 (two) times daily as needed for refractory nausea / vomiting. Start on day 3 after chemotherapy. 30 tablet 1  . potassium chloride SA  (K-DUR,KLOR-CON) 20 MEQ tablet Take 1 tablet (20 mEq total) by mouth 2 (two) times daily. 40 tablet 1  . prochlorperazine (COMPAZINE) 10 MG tablet Take 1 tablet (10 mg total) by mouth every 6 (six) hours as needed (Nausea or vomiting). 30 tablet 1  . tadalafil (CIALIS) 5 MG tablet Take 5 mg by mouth daily.    . tamsulosin (FLOMAX) 0.4 MG CAPS capsule Take 0.4 mg by mouth daily.    Marland Kitchen triamterene-hydrochlorothiazide (MAXZIDE-25) 37.5-25 MG tablet Take 1 tablet by mouth daily.     No current facility-administered medications for this visit.   Facility-Administered Medications Ordered in Other Visits  Medication Dose Route Frequency Provider Last Rate Last Admin  . dexamethasone (DECADRON) 10 mg in sodium chloride 0.9 % 50 mL IVPB  10 mg Intravenous Once Truitt Merle, MD 204 mL/hr at 01/24/20 1026 10 mg at 01/24/20 1026  . dextrose 5 % solution   Intravenous Once Truitt Merle, MD      . fluorouracil (ADRUCIL) 4,150 mg in sodium chloride 0.9 % 67 mL chemo infusion  2,400 mg/m2 (Treatment Plan Recorded) Intravenous 1 day or 1 dose Truitt Merle, MD      . heparin lock flush 100 unit/mL  500 Units Intracatheter Once PRN Truitt Merle, MD      . leucovorin 692 mg in dextrose 5 % 250 mL infusion  400 mg/m2 (Treatment Plan Recorded) Intravenous Once Truitt Merle, MD      . oxaliplatin (ELOXATIN) 120 mg in dextrose 5 % 500 mL chemo infusion  70 mg/m2 (Treatment Plan Recorded) Intravenous Once Truitt Merle, MD      . pembrolizumab [facility]) 400 mg in sodium chloride 0.9 % 50 mL chemo infusion  400 mg Intravenous Once Truitt Merle, MD      . sodium chloride flush (NS) 0.9 % injection 10 mL  10 mL Intracatheter PRN Truitt Merle, MD      . Theotis [facility]) 357 mg in sodium chloride 0.9 % 250 mL chemo infusion  6 mg/kg (Treatment Plan Recorded) Intravenous Once Truitt Merle, MD        PHYSICAL EXAMINATION: ECOG PERFORMANCE STATUS: 1 - Symptomatic but completely ambulatory  Vitals:   01/24/20 0852  BP: 126/82  Pulse: 67   Resp: 20  Temp: 98.3 F (36.8 C)  SpO2: 100%   Filed Weights   01/24/20 0852  Weight: 125 lb 1.6 oz (56.7 kg)    GENERAL:alert, no distress and comfortable SKIN: No rash to exposed skin  EYES: sclera clear NECK: Without mass LUNGS: clear with normal breathing effort HEART: regular rate & rhythm, no lower extremity edema ABDOMEN:abdomen soft, non-tender and normal bowel sounds.  No palpable mass or hepatomegaly. NEURO: alert & oriented x 3 with fluent speech PAC without erythema  LABORATORY DATA:  I have reviewed the data as listed CBC Latest Ref Rng & Units 01/24/2020 12/27/2019 12/14/2019  WBC 4.0 - 10.5 K/uL 8.4 6.0 6.0  Hemoglobin 13.0 - 17.0 g/dL 10.4(L) 10.9(L) 11.3(L)  Hematocrit 39 - 52 % 32.7(L) 34.3(L)  36.6(L)  Platelets 150 - 400 K/uL 244 259 308     CMP Latest Ref Rng & Units 01/24/2020 12/27/2019 12/07/2019  Glucose 70 - 99 mg/dL 79 77 78  BUN 8 - 23 mg/dL _0 Creatinine 0.61 - 1.24 mg/dL 0.84 0.91 1.07  Sodium 135 - 145 mmol/L 140 139 139  Potassium 3.5 - 5.1 mmol/L 3.5 3.7 4.0  Chloride 98 - 111 mmol/L 109 109 110  CO2 22 - 32 mmol/L 26 20(L) 23  Calcium 8.9 - 10.3 mg/dL 9.0 9.4 9.5  Total Protein 6.5 - 8.1 g/dL 6.0(L) 5.9(L) 5.9(L)  Total Bilirubin 0.3 - 1.2 mg/dL 0.5 0.4 0.5  Alkaline Phos 38 - 126 U/L 88 92 79  AST 15 - 41 U/L 14(L) 13(L) 15  ALT 0 - 44 U/L _1 RADIOGRAPHIC STUDIES: I have personally reviewed the radiological images as listed and agreed with the findings in the report. No results found.   ASSESSMENT & PLAN: Gabriel Poole a 74y.o.malewith   1. Primary adenocarcinoma of the pyloric antrum, pT4a, PN3aM0, stage IIIB, Grade3, MSI-stable, recurrence at the anastomosis and new foci in the gastric fundus in 10/2019, and liver mets 12/2019  -Diagnosed in 02/2017.S/psurgical resection and adjuvant chemotherapyFOLFOX. Patient was not compliant with chemo, oxaliplatin stopped after 4 cycles, and 5-FU stopped after 8  cycles treatment per his request. -His last colonoscopy was in 2015, previously followedby Dr. Paulita Fujita. -surveillance CTs have been negative for recurrence or metastatic disease, last Poole 08/16/19 -he has been on surveillance. He developed weight loss and recurrent IDA -His 10/2019 GI work-up by Dr. Laural Poole shows recurrent poorly differentiated adenocarcionma at the anastomosis with additional focus of adeno in the gastric fundus -The case was discussed in GI tumor board, Dr. Barry Dienes did not offer surgery  -His PET from 12/05/2019 showed known gastric cancer with associated metastatic disease in the liver and small lymph nodes in the area of the hepatic gastric recess.  Liver biopsy from 12/14/2019 confirmed metastatic disease. -He began first-line palliative FOLFOX every 2 weeks on 12/07/2019.  Tolerating well. -Molecular testing shows PD-L1 < 1% negative, EBV negative, MSI-stable, HER2 + (3+). He is a candidate for anti-her2 therapy with trastuzumab. Also, recent studies show a benefit to adding immunotherapy in metastatic gastric HER-2 positive disease, as such Dr. Burr Medico has recommended adding pembrolizumab as well.  -Consent: Potential side effects of trastuzumab including but not limited to cardiac toxicity, diarrhea, fatigue were discussed in detail. He agrees to proceed.  Baseline echo is normal. -Consent: Potential side effects of pembrolizumab including but not limited to immune mediated toxicities such as cough/pneumonitis, diarrhea/colitis, rash, hypothyroidism, fatigue were discussed in detail.  He agrees to proceed -The goal remains palliative, to control the disease, improve his symptoms, and prolong his life  -Plan to add trastuzumab (q. 2 weeks) and pembrolizumab (q. 6 weeks) with cycle 3 FOLFOX today.  2. Recurrent iron deficiency anemia  -he initially had low serum iron and low transferrin saturation in 06/2017 after cancer resection; iron and Ferritin were normal in the interval. -he  developed worsening anemia in 03/2019, Hg 10.2 on 11/19, ferritin 12, serum iron 21 and 6% transferrin saturation, consistent with iron deficiency -He received RBC transfusion on 3/20 when Hgb dropped to 6.7 and he became symptomatic with dizziness, fatigue, nausea. Symptoms resolved after transfusion. -he does not tolerate oral iron. He received IV Feraheme on 08/08/19 and 08/21/19.  He responded initially but he  has not been able to maintain a durable response to IV iron.  -his work up in 10/2019 per Dr. Laural Poole showed recurrent adenocarcinoma at the anastomosis and cratered ulcer in the gastric fundus, path showed adeno as well. This is the source of his bleeding/IDA.  -Continue IV iron as needed, iron studies normal today  3. Weight loss, low appetite, occasional postprandial emesis -likely related to prior surgery and now cancer recurrence -Followed by dietitian -Increase nutrition supplements to 2/day -he continues losing weight, I previously started Megace for appetite stimulation -Encouraged him to take Zofran a.m. before meals and Compazine p.m. before meals to promote weight gain and overall nutrition   4.Ascendingaortic aneurysm -stable at 4 cm per CT CAP on 08/16/19  5. Smokingcessation -Previously counseled on smoking cessation  -We again discussed smoking cessation and avoiding alcohol today  6. BPH and elevated PSA -severe prostatomegalynoted on multiple scansincluding CT CAP from 08/16/19 -has urologistin [place]  Disposition: Mr. Rathgeber appears stable.  He completed 2 cycles of FOLFOX.  He tolerates treatment moderately well with low appetite, cold sensitivity, and constipation.  Continues MiraLAX. He is able to recover and function well and continue working. He does have a difficult social situation.   We discussed diet/nutrition. I recommend to add Zofran a.m. before meals and Compazine p.m. before meals to reduce postprandial vomiting. I refilled Megace and  encouraged him to take once daily.  I recommend to increase supplements.  He will see the dietitian today. I gave these instructions in writing today.  We reviewed the CBC and CMP from today.  Iron studies remain adequate.  The TSH is pending. Due to benefits seen in metastatic HER2+ gastric cancer, the recommendation is to add q2 weeks trastuzumab and q6 weeks pembrolizumab today. See above for consent. He will proceed with FOLFOX, trastuzumab, and pembrolizumab today.   We discussed the COVID-19 booster shot, he is eligible and interested.  He declined it today but plans to get it after he recovers from chemo.  Follow-up in 2 weeks with FOLFOX and trastuzumab/biosimilar, next pembro in 6 weeks.   The patient was seen with Dr. Burr Medico.    Orders Placed This Encounter  Procedures  . TSH    Standing Status:   Standing    Number of Occurrences:   20    Standing Expiration Date:   01/23/2021   All questions were answered. The patient knows to call the clinic with any problems, questions or concerns. No barriers to learning were detected.     Alla Feeling, NP 01/24/20   Addendum  I have seen the patient, examined him. I agree with the assessment and and plan and have edited the notes.   Pt did not chemo 2 weeks ago due to scheduling error. He returns for treatment today. I reviewed the benefit and side effect of trastuzumab and pembrolizumab, especially side effect of reversible cardiomyopathy, autoimmune related side effects, and disorders,he agrees to proceed.  I discussed the goal of therapy is palliative to prolong his life and improve his quality of life.  I also met his ex-wife Gabriel Poole and reviewed above with her, she agrees with the plan. Pt is in a difficult social situation, I encouraged him to take a break from work if needed after treatment, and ask his children or Gabriel Poole to help him if needed.   Truitt Merle  01/24/2020

## 2020-01-24 ENCOUNTER — Inpatient Hospital Stay: Payer: Medicare HMO | Attending: Hematology

## 2020-01-24 ENCOUNTER — Other Ambulatory Visit: Payer: Self-pay

## 2020-01-24 ENCOUNTER — Telehealth: Payer: Self-pay | Admitting: Hematology

## 2020-01-24 ENCOUNTER — Inpatient Hospital Stay: Payer: Medicare HMO

## 2020-01-24 ENCOUNTER — Encounter: Payer: Self-pay | Admitting: Nurse Practitioner

## 2020-01-24 ENCOUNTER — Inpatient Hospital Stay: Payer: Medicare HMO | Admitting: Nutrition

## 2020-01-24 ENCOUNTER — Inpatient Hospital Stay (HOSPITAL_BASED_OUTPATIENT_CLINIC_OR_DEPARTMENT_OTHER): Payer: Medicare HMO | Admitting: Nurse Practitioner

## 2020-01-24 VITALS — BP 126/82 | HR 67 | Temp 98.3°F | Resp 20 | Ht 70.0 in | Wt 125.1 lb

## 2020-01-24 DIAGNOSIS — Z7189 Other specified counseling: Secondary | ICD-10-CM

## 2020-01-24 DIAGNOSIS — C163 Malignant neoplasm of pyloric antrum: Secondary | ICD-10-CM | POA: Insufficient documentation

## 2020-01-24 DIAGNOSIS — R972 Elevated prostate specific antigen [PSA]: Secondary | ICD-10-CM | POA: Diagnosis not present

## 2020-01-24 DIAGNOSIS — Z5111 Encounter for antineoplastic chemotherapy: Secondary | ICD-10-CM | POA: Insufficient documentation

## 2020-01-24 DIAGNOSIS — C162 Malignant neoplasm of body of stomach: Secondary | ICD-10-CM

## 2020-01-24 DIAGNOSIS — Z79899 Other long term (current) drug therapy: Secondary | ICD-10-CM | POA: Diagnosis not present

## 2020-01-24 DIAGNOSIS — D649 Anemia, unspecified: Secondary | ICD-10-CM

## 2020-01-24 DIAGNOSIS — F17210 Nicotine dependence, cigarettes, uncomplicated: Secondary | ICD-10-CM | POA: Diagnosis not present

## 2020-01-24 DIAGNOSIS — N400 Enlarged prostate without lower urinary tract symptoms: Secondary | ICD-10-CM | POA: Insufficient documentation

## 2020-01-24 DIAGNOSIS — I712 Thoracic aortic aneurysm, without rupture: Secondary | ICD-10-CM | POA: Diagnosis not present

## 2020-01-24 DIAGNOSIS — Z95828 Presence of other vascular implants and grafts: Secondary | ICD-10-CM

## 2020-01-24 LAB — CBC WITH DIFFERENTIAL (CANCER CENTER ONLY)
Abs Immature Granulocytes: 0.02 10*3/uL (ref 0.00–0.07)
Basophils Absolute: 0 10*3/uL (ref 0.0–0.1)
Basophils Relative: 0 %
Eosinophils Absolute: 0 10*3/uL (ref 0.0–0.5)
Eosinophils Relative: 0 %
HCT: 32.7 % — ABNORMAL LOW (ref 39.0–52.0)
Hemoglobin: 10.4 g/dL — ABNORMAL LOW (ref 13.0–17.0)
Immature Granulocytes: 0 %
Lymphocytes Relative: 11 %
Lymphs Abs: 0.9 10*3/uL (ref 0.7–4.0)
MCH: 28.7 pg (ref 26.0–34.0)
MCHC: 31.8 g/dL (ref 30.0–36.0)
MCV: 90.3 fL (ref 80.0–100.0)
Monocytes Absolute: 0.4 10*3/uL (ref 0.1–1.0)
Monocytes Relative: 5 %
Neutro Abs: 7 10*3/uL (ref 1.7–7.7)
Neutrophils Relative %: 84 %
Platelet Count: 244 10*3/uL (ref 150–400)
RBC: 3.62 MIL/uL — ABNORMAL LOW (ref 4.22–5.81)
RDW: 13.8 % (ref 11.5–15.5)
WBC Count: 8.4 10*3/uL (ref 4.0–10.5)
nRBC: 0 % (ref 0.0–0.2)

## 2020-01-24 LAB — IRON AND TIBC
Iron: 30 ug/dL — ABNORMAL LOW (ref 42–163)
Saturation Ratios: 12 % — ABNORMAL LOW (ref 20–55)
TIBC: 256 ug/dL (ref 202–409)
UIBC: 226 ug/dL (ref 117–376)

## 2020-01-24 LAB — CMP (CANCER CENTER ONLY)
ALT: 7 U/L (ref 0–44)
AST: 14 U/L — ABNORMAL LOW (ref 15–41)
Albumin: 3.1 g/dL — ABNORMAL LOW (ref 3.5–5.0)
Alkaline Phosphatase: 88 U/L (ref 38–126)
Anion gap: 5 (ref 5–15)
BUN: 16 mg/dL (ref 8–23)
CO2: 26 mmol/L (ref 22–32)
Calcium: 9 mg/dL (ref 8.9–10.3)
Chloride: 109 mmol/L (ref 98–111)
Creatinine: 0.84 mg/dL (ref 0.61–1.24)
GFR, Est AFR Am: >60 mL/min (ref >60)
GFR, Estimated: >60 mL/min (ref >60)
Glucose, Bld: 79 mg/dL (ref 70–99)
Potassium: 3.5 mmol/L (ref 3.5–5.1)
Sodium: 140 mmol/L (ref 135–145)
Total Bilirubin: 0.5 mg/dL (ref 0.3–1.2)
Total Protein: 6 g/dL — ABNORMAL LOW (ref 6.5–8.1)

## 2020-01-24 LAB — FERRITIN: Ferritin: 44 ng/mL (ref 24–336)

## 2020-01-24 LAB — TSH: TSH: 1.518 u[IU]/mL (ref 0.320–4.118)

## 2020-01-24 MED ORDER — PALONOSETRON HCL INJECTION 0.25 MG/5ML
0.2500 mg | Freq: Once | INTRAVENOUS | Status: AC
  Administered 2020-01-24: 0.25 mg via INTRAVENOUS

## 2020-01-24 MED ORDER — SODIUM CHLORIDE 0.9 % IV SOLN
400.0000 mg | Freq: Once | INTRAVENOUS | Status: AC
  Administered 2020-01-24: 400 mg via INTRAVENOUS
  Filled 2020-01-24: qty 16

## 2020-01-24 MED ORDER — TRASTUZUMAB-ANNS CHEMO 150 MG IV SOLR
6.0000 mg/kg | Freq: Once | INTRAVENOUS | Status: AC
  Administered 2020-01-24: 357 mg via INTRAVENOUS
  Filled 2020-01-24: qty 17

## 2020-01-24 MED ORDER — SODIUM CHLORIDE 0.9 % IV SOLN
2400.0000 mg/m2 | INTRAVENOUS | Status: DC
  Administered 2020-01-24: 4150 mg via INTRAVENOUS
  Filled 2020-01-24: qty 83

## 2020-01-24 MED ORDER — SODIUM CHLORIDE 0.9 % IV SOLN
Freq: Once | INTRAVENOUS | Status: AC
  Filled 2020-01-24: qty 250

## 2020-01-24 MED ORDER — SODIUM CHLORIDE 0.9 % IV SOLN
10.0000 mg | Freq: Once | INTRAVENOUS | Status: AC
  Administered 2020-01-24: 10 mg via INTRAVENOUS
  Filled 2020-01-24: qty 10

## 2020-01-24 MED ORDER — PALONOSETRON HCL INJECTION 0.25 MG/5ML
INTRAVENOUS | Status: AC
  Filled 2020-01-24: qty 5

## 2020-01-24 MED ORDER — OXALIPLATIN CHEMO INJECTION 100 MG/20ML
70.0000 mg/m2 | Freq: Once | INTRAVENOUS | Status: AC
  Administered 2020-01-24: 120 mg via INTRAVENOUS
  Filled 2020-01-24: qty 20

## 2020-01-24 MED ORDER — MEGESTROL ACETATE 625 MG/5ML PO SUSP: 625.0000 mg | Freq: Every day | ORAL | 0 refills | Status: DC

## 2020-01-24 MED ORDER — SODIUM CHLORIDE 0.9% FLUSH
10.0000 mL | INTRAVENOUS | Status: DC | PRN
  Administered 2020-01-24: 10 mL via INTRAVENOUS
  Filled 2020-01-24: qty 10

## 2020-01-24 MED ORDER — DEXTROSE 5 % IV SOLN
Freq: Once | INTRAVENOUS | Status: DC
  Filled 2020-01-24: qty 250

## 2020-01-24 MED ORDER — HEPARIN SOD (PORK) LOCK FLUSH 100 UNIT/ML IV SOLN
500.0000 [IU] | Freq: Once | INTRAVENOUS | Status: DC | PRN
  Filled 2020-01-24: qty 5

## 2020-01-24 MED ORDER — LEUCOVORIN CALCIUM INJECTION 350 MG
400.0000 mg/m2 | Freq: Once | INTRAVENOUS | Status: AC
  Administered 2020-01-24: 692 mg via INTRAVENOUS
  Filled 2020-01-24: qty 34.6

## 2020-01-24 MED ORDER — PROCHLORPERAZINE MALEATE 10 MG PO TABS: 10.0000 mg | ORAL_TABLET | Freq: Four times a day (QID) | ORAL | 1 refills | Status: DC | PRN

## 2020-01-24 MED ORDER — ONDANSETRON HCL 8 MG PO TABS: 8.0000 mg | ORAL_TABLET | Freq: Two times a day (BID) | ORAL | 1 refills | Status: DC | PRN

## 2020-01-24 MED ORDER — SODIUM CHLORIDE 0.9% FLUSH
10.0000 mL | INTRAVENOUS | Status: DC | PRN
  Filled 2020-01-24: qty 10

## 2020-01-24 NOTE — Telephone Encounter (Signed)
Scheduled appointments per 9/15 los. Scheduled appointments per patient request as well, patient requested mid morning time slots. Gave patient calendar print out at time of service.

## 2020-01-24 NOTE — Patient Instructions (Signed)
[facility] Discharge Instructions for Patients Receiving Chemotherapy  Today you received the following chemotherapy agents: Oxaliplatin, Leucovorin, and Fluorouracil, keytruda, herceptin.  To help prevent nausea and vomiting after your treatment, we encourage you to take your nausea medication as prescribed.    If you develop nausea and vomiting that is not controlled by your nausea medication, call the clinic.   BELOW ARE SYMPTOMS THAT SHOULD BE REPORTED IMMEDIATELY:  *FEVER GREATER THAN 100.5 F  *CHILLS WITH OR WITHOUT FEVER  NAUSEA AND VOMITING THAT IS NOT CONTROLLED WITH YOUR NAUSEA MEDICATION  *UNUSUAL SHORTNESS OF BREATH  *UNUSUAL BRUISING OR BLEEDING  TENDERNESS IN MOUTH AND THROAT WITH OR WITHOUT PRESENCE OF ULCERS  *URINARY PROBLEMS  *BOWEL PROBLEMS  UNUSUAL RASH Items with * indicate a potential emergency and should be followed up as soon as possible.  Feel free to call the clinic should you have any questions or concerns. The clinic phone number is [phone].  Please show the [facility] at check-in to the Emergency Department and triage nurse.  Pembrolizumab injection What is this medicine? PEMBROLIZUMAB (pem broe liz ue mab) is a monoclonal antibody. It is used to treat certain types of cancer. This medicine may be used for other purposes; ask your health care provider or pharmacist if you have questions. COMMON BRAND NAME(S): Keytruda What should I tell my health care provider before I take this medicine? They need to know if you have any of these conditions:  diabetes  immune system problems  inflammatory bowel disease  liver disease  lung or breathing disease  lupus  received or scheduled to receive an organ transplant or a stem-cell transplant that uses donor stem cells  an unusual or allergic reaction to pembrolizumab, other medicines, foods, dyes, or preservatives  pregnant or trying to get  pregnant  breast-feeding How should I use this medicine? This medicine is for infusion into a vein. It is given by a health care professional in a hospital or clinic setting. A special MedGuide will be given to you before each treatment. Be sure to read this information carefully each time. Talk to your pediatrician regarding the use of this medicine in children. While this drug may be prescribed for children as young as 6 months for selected conditions, precautions do apply. Overdosage: If you think you have taken too much of this medicine contact a poison control center or emergency room at once. NOTE: This medicine is only for you. Do not share this medicine with others. What if I miss a dose? It is important not to miss your dose. Call your doctor or health care professional if you are unable to keep an appointment. What may interact with this medicine? Interactions have not been studied. Give your health care provider a list of all the medicines, herbs, non-prescription drugs, or dietary supplements you use. Also tell them if you smoke, drink alcohol, or use illegal drugs. Some items may interact with your medicine. This list may not describe all possible interactions. Give your health care provider a list of all the medicines, herbs, non-prescription drugs, or dietary supplements you use. Also tell them if you smoke, drink alcohol, or use illegal drugs. Some items may interact with your medicine. What should I watch for while using this medicine? Your condition will be monitored carefully while you are receiving this medicine. You may need blood work done while you are taking this medicine. Do not become pregnant while taking this medicine or for 4  months after stopping it. Women should inform their doctor if they wish to become pregnant or think they might be pregnant. There is a potential for serious side effects to an unborn child. Talk to your health care professional or pharmacist for  more information. Do not breast-feed an infant while taking this medicine or for 4 months after the last dose. What side effects may I notice from receiving this medicine? Side effects that you should report to your doctor or health care professional as soon as possible:  allergic reactions like skin rash, itching or hives, swelling of the face, lips, or tongue  bloody or black, tarry  breathing problems  changes in vision  chest pain  chills  confusion  constipation  cough  diarrhea  dizziness or feeling faint or lightheaded  fast or irregular heartbeat  fever  flushing  joint pain  low blood counts - this medicine may decrease the number of white blood cells, red blood cells and platelets. You may be at increased risk for infections and bleeding.  muscle pain  muscle weakness  pain, tingling, numbness in the hands or feet  persistent headache  redness, blistering, peeling or loosening of the skin, including inside the mouth  signs and symptoms of high blood sugar such as dizziness; dry mouth; dry skin; fruity breath; nausea; stomach pain; increased hunger or thirst; increased urination  signs and symptoms of kidney injury like trouble passing urine or change in the amount of urine  signs and symptoms of liver injury like dark urine, light-colored stools, loss of appetite, nausea, right upper belly pain, yellowing of the eyes or skin  sweating  swollen lymph nodes  weight loss Side effects that usually do not require medical attention (report to your doctor or health care professional if they continue or are bothersome):  decreased appetite  hair loss  muscle pain  tiredness This list may not describe all possible side effects. Call your doctor for medical advice about side effects. You may report side effects to FDA at 1-800-FDA-1088. Where should I keep my medicine? This drug is given in a hospital or clinic and will not be stored at home. NOTE:  This sheet is a summary. It may not cover all possible information. If you have questions about this medicine, talk to your doctor, pharmacist, or health care provider.  2020 Elsevier/Gold Standard (2019-03-03 18:07:58)  Trastuzumab injection for infusion What is this medicine? TRASTUZUMAB (tras TOO zoo mab) is a monoclonal antibody. It is used to treat breast cancer and stomach cancer. This medicine may be used for other purposes; ask your health care provider or pharmacist if you have questions. COMMON BRAND NAME(S): Herceptin, Galvin Proffer, Trazimera What should I tell my health care provider before I take this medicine? They need to know if you have any of these conditions:  heart disease  heart failure  lung or breathing disease, like asthma  an unusual or allergic reaction to trastuzumab, benzyl alcohol, or other medications, foods, dyes, or preservatives  pregnant or trying to get pregnant  breast-feeding How should I use this medicine? This drug is given as an infusion into a vein. It is administered in a hospital or clinic by a specially trained health care professional. Talk to your pediatrician regarding the use of this medicine in children. This medicine is not approved for use in children. Overdosage: If you think you have taken too much of this medicine contact a poison control center or emergency room at  once. NOTE: This medicine is only for you. Do not share this medicine with others. What if I miss a dose? It is important not to miss a dose. Call your doctor or health care professional if you are unable to keep an appointment. What may interact with this medicine? This medicine may interact with the following medications:  certain types of chemotherapy, such as daunorubicin, doxorubicin, epirubicin, and idarubicin This list may not describe all possible interactions. Give your health care provider a list of all the medicines, herbs,  non-prescription drugs, or dietary supplements you use. Also tell them if you smoke, drink alcohol, or use illegal drugs. Some items may interact with your medicine. What should I watch for while using this medicine? Visit your doctor for checks on your progress. Report any side effects. Continue your course of treatment even though you feel ill unless your doctor tells you to stop. Call your doctor or health care professional for advice if you get a fever, chills or sore throat, or other symptoms of a cold or flu. Do not treat yourself. Try to avoid being around people who are sick. You may experience fever, chills and shaking during your first infusion. These effects are usually mild and can be treated with other medicines. Report any side effects during the infusion to your health care professional. Fever and chills usually do not happen with later infusions. Do not become pregnant while taking this medicine or for 7 months after stopping it. Women should inform their doctor if they wish to become pregnant or think they might be pregnant. Women of child-bearing potential will need to have a negative pregnancy test before starting this medicine. There is a potential for serious side effects to an unborn child. Talk to your health care professional or pharmacist for more information. Do not breast-feed an infant while taking this medicine or for 7 months after stopping it. Women must use effective birth control with this medicine. What side effects may I notice from receiving this medicine? Side effects that you should report to your doctor or health care professional as soon as possible:  allergic reactions like skin rash, itching or hives, swelling of the face, lips, or tongue  chest pain or palpitations  cough  dizziness  feeling faint or lightheaded, falls  fever  general ill feeling or flu-like symptoms  signs of worsening heart failure like breathing problems; swelling in your legs and  feet  unusually weak or tired Side effects that usually do not require medical attention (report to your doctor or health care professional if they continue or are bothersome):  bone pain  changes in taste  diarrhea  joint pain  nausea/vomiting  weight loss This list may not describe all possible side effects. Call your doctor for medical advice about side effects. You may report side effects to FDA at 1-800-FDA-1088. Where should I keep my medicine? This drug is given in a hospital or clinic and will not be stored at home. NOTE: This sheet is a summary. It may not cover all possible information. If you have questions about this medicine, talk to your doctor, pharmacist, or health care provider.  2020 Elsevier/Gold Standard (2016-04-21 14:37:52)

## 2020-01-24 NOTE — Progress Notes (Signed)
Nutrition follow-up completed with patient during infusion for recurrent gastric cancer. Weight decreased and was documented as 125.1 pounds on September 15 down from 128.5 pounds July 29. Patient remains underweight. Patient has been told to take Zofran and Compazine to assist with nausea and vomiting. He also has a prescription for Megace. Does not like Ensure or boost.  Drinks Carnation breakfast and is requesting samples.  Nutrition diagnosis: Inadequate oral intake continues.  Intervention: I explained I did not have samples for Carnation breakfast essentials.  I offered samples of other products however patient declined.  Encourage patient to increase consumption of Carnation breakfast essentials twice daily. Encouraged compliance with nausea and appetite medication. Encouraged smaller more frequent meals and snacks. Questions answered.  Monitoring, evaluation, goals: Patient will tolerate increased calories and protein to minimize further weight loss.  Next visit: Wednesday, September 29 during infusion.  **Disclaimer: This note was dictated with voice recognition software. Similar sounding words can inadvertently be transcribed and this note may contain transcription errors which may not have been corrected upon publication of note.**

## 2020-01-24 NOTE — Patient Instructions (Signed)

## 2020-01-25 ENCOUNTER — Other Ambulatory Visit: Payer: Medicare HMO

## 2020-01-25 ENCOUNTER — Ambulatory Visit: Payer: Medicare HMO | Admitting: Hematology

## 2020-01-25 ENCOUNTER — Ambulatory Visit: Payer: Medicare HMO

## 2020-01-26 ENCOUNTER — Inpatient Hospital Stay: Payer: Medicare HMO

## 2020-01-26 ENCOUNTER — Other Ambulatory Visit: Payer: Self-pay

## 2020-01-26 VITALS — BP 122/74 | HR 75 | Resp 18

## 2020-01-26 DIAGNOSIS — Z7189 Other specified counseling: Secondary | ICD-10-CM

## 2020-01-26 DIAGNOSIS — Z5111 Encounter for antineoplastic chemotherapy: Secondary | ICD-10-CM | POA: Diagnosis not present

## 2020-01-26 DIAGNOSIS — C162 Malignant neoplasm of body of stomach: Secondary | ICD-10-CM

## 2020-01-26 MED ORDER — HEPARIN SOD (PORK) LOCK FLUSH 100 UNIT/ML IV SOLN
500.0000 [IU] | Freq: Once | INTRAVENOUS | Status: AC | PRN
  Administered 2020-01-26: 500 [IU]
  Filled 2020-01-26: qty 5

## 2020-01-26 MED ORDER — SODIUM CHLORIDE 0.9% FLUSH
10.0000 mL | INTRAVENOUS | Status: DC | PRN
  Administered 2020-01-26: 10 mL
  Filled 2020-01-26: qty 10

## 2020-01-26 NOTE — Patient Instructions (Signed)

## 2020-02-07 ENCOUNTER — Other Ambulatory Visit: Payer: Self-pay | Admitting: Hematology

## 2020-02-07 ENCOUNTER — Inpatient Hospital Stay: Payer: Medicare HMO

## 2020-02-07 ENCOUNTER — Inpatient Hospital Stay (HOSPITAL_BASED_OUTPATIENT_CLINIC_OR_DEPARTMENT_OTHER): Payer: Medicare HMO | Admitting: Hematology

## 2020-02-07 ENCOUNTER — Encounter: Payer: Self-pay | Admitting: Hematology

## 2020-02-07 ENCOUNTER — Ambulatory Visit: Payer: Medicare HMO | Admitting: Nurse Practitioner

## 2020-02-07 ENCOUNTER — Other Ambulatory Visit: Payer: Self-pay

## 2020-02-07 ENCOUNTER — Inpatient Hospital Stay: Payer: Medicare HMO | Admitting: Nutrition

## 2020-02-07 VITALS — BP 159/78 | HR 65 | Temp 98.1°F | Resp 20 | Ht 70.0 in | Wt 126.3 lb

## 2020-02-07 DIAGNOSIS — I10 Essential (primary) hypertension: Secondary | ICD-10-CM | POA: Diagnosis not present

## 2020-02-07 DIAGNOSIS — C162 Malignant neoplasm of body of stomach: Secondary | ICD-10-CM | POA: Diagnosis not present

## 2020-02-07 DIAGNOSIS — Z7189 Other specified counseling: Secondary | ICD-10-CM

## 2020-02-07 DIAGNOSIS — Z95828 Presence of other vascular implants and grafts: Secondary | ICD-10-CM

## 2020-02-07 DIAGNOSIS — Z5111 Encounter for antineoplastic chemotherapy: Secondary | ICD-10-CM | POA: Diagnosis not present

## 2020-02-07 LAB — CMP (CANCER CENTER ONLY)
ALT: 10 U/L (ref 0–44)
AST: 14 U/L — ABNORMAL LOW (ref 15–41)
Albumin: 3 g/dL — ABNORMAL LOW (ref 3.5–5.0)
Alkaline Phosphatase: 73 U/L (ref 38–126)
Anion gap: 3 — ABNORMAL LOW (ref 5–15)
BUN: 17 mg/dL (ref 8–23)
CO2: 26 mmol/L (ref 22–32)
Calcium: 8.7 mg/dL — ABNORMAL LOW (ref 8.9–10.3)
Chloride: 113 mmol/L — ABNORMAL HIGH (ref 98–111)
Creatinine: 0.92 mg/dL (ref 0.61–1.24)
GFR, Est AFR Am: >60 mL/min (ref >60)
GFR, Estimated: >60 mL/min (ref >60)
Glucose, Bld: 78 mg/dL (ref 70–99)
Potassium: 3.1 mmol/L — ABNORMAL LOW (ref 3.5–5.1)
Sodium: 142 mmol/L (ref 135–145)
Total Bilirubin: 0.4 mg/dL (ref 0.3–1.2)
Total Protein: 5.5 g/dL — ABNORMAL LOW (ref 6.5–8.1)

## 2020-02-07 LAB — CBC WITH DIFFERENTIAL (CANCER CENTER ONLY)
Abs Immature Granulocytes: 0.01 10*3/uL (ref 0.00–0.07)
Basophils Absolute: 0 10*3/uL (ref 0.0–0.1)
Basophils Relative: 0 %
Eosinophils Absolute: 0.1 10*3/uL (ref 0.0–0.5)
Eosinophils Relative: 1 %
HCT: 30.7 % — ABNORMAL LOW (ref 39.0–52.0)
Hemoglobin: 9.8 g/dL — ABNORMAL LOW (ref 13.0–17.0)
Immature Granulocytes: 0 %
Lymphocytes Relative: 19 %
Lymphs Abs: 1.1 10*3/uL (ref 0.7–4.0)
MCH: 27.9 pg (ref 26.0–34.0)
MCHC: 31.9 g/dL (ref 30.0–36.0)
MCV: 87.5 fL (ref 80.0–100.0)
Monocytes Absolute: 0.5 10*3/uL (ref 0.1–1.0)
Monocytes Relative: 8 %
Neutro Abs: 4.1 10*3/uL (ref 1.7–7.7)
Neutrophils Relative %: 72 %
Platelet Count: 252 10*3/uL (ref 150–400)
RBC: 3.51 MIL/uL — ABNORMAL LOW (ref 4.22–5.81)
RDW: 14 % (ref 11.5–15.5)
WBC Count: 5.7 10*3/uL (ref 4.0–10.5)
nRBC: 0 % (ref 0.0–0.2)

## 2020-02-07 MED ORDER — HEPARIN SOD (PORK) LOCK FLUSH 100 UNIT/ML IV SOLN
500.0000 [IU] | Freq: Once | INTRAVENOUS | Status: DC | PRN
  Filled 2020-02-07: qty 5

## 2020-02-07 MED ORDER — PALONOSETRON HCL INJECTION 0.25 MG/5ML
0.2500 mg | Freq: Once | INTRAVENOUS | Status: AC
  Administered 2020-02-07: 0.25 mg via INTRAVENOUS

## 2020-02-07 MED ORDER — DEXTROSE 5 % IV SOLN
Freq: Once | INTRAVENOUS | Status: AC
  Filled 2020-02-07: qty 250

## 2020-02-07 MED ORDER — PROCHLORPERAZINE MALEATE 10 MG PO TABS: 10.0000 mg | ORAL_TABLET | Freq: Four times a day (QID) | ORAL | 1 refills | Status: DC | PRN

## 2020-02-07 MED ORDER — SODIUM CHLORIDE 0.9% FLUSH
10.0000 mL | INTRAVENOUS | Status: DC | PRN
  Filled 2020-02-07: qty 10

## 2020-02-07 MED ORDER — SODIUM CHLORIDE 0.9 % IV SOLN
INTRAVENOUS | Status: DC
  Filled 2020-02-07: qty 250

## 2020-02-07 MED ORDER — PALONOSETRON HCL INJECTION 0.25 MG/5ML
INTRAVENOUS | Status: AC
  Filled 2020-02-07: qty 5

## 2020-02-07 MED ORDER — TRASTUZUMAB-ANNS CHEMO 150 MG IV SOLR
231.0000 mg | Freq: Once | INTRAVENOUS | Status: AC
  Administered 2020-02-07: 231 mg via INTRAVENOUS
  Filled 2020-02-07: qty 11

## 2020-02-07 MED ORDER — OXALIPLATIN CHEMO INJECTION 100 MG/20ML
70.0000 mg/m2 | Freq: Once | INTRAVENOUS | Status: AC
  Administered 2020-02-07: 120 mg via INTRAVENOUS
  Filled 2020-02-07: qty 20

## 2020-02-07 MED ORDER — SODIUM CHLORIDE 0.9 % IV SOLN
2400.0000 mg/m2 | INTRAVENOUS | Status: DC
  Administered 2020-02-07: 4150 mg via INTRAVENOUS
  Filled 2020-02-07: qty 83

## 2020-02-07 MED ORDER — DIPHENHYDRAMINE HCL 25 MG PO CAPS
25.0000 mg | ORAL_CAPSULE | Freq: Once | ORAL | Status: AC
  Administered 2020-02-07: 25 mg via ORAL

## 2020-02-07 MED ORDER — LEUCOVORIN CALCIUM INJECTION 350 MG
400.0000 mg/m2 | Freq: Once | INTRAVENOUS | Status: AC
  Administered 2020-02-07: 692 mg via INTRAVENOUS
  Filled 2020-02-07: qty 34.6

## 2020-02-07 MED ORDER — SODIUM CHLORIDE 0.9 % IV SOLN
10.0000 mg | Freq: Once | INTRAVENOUS | Status: AC
  Administered 2020-02-07: 10 mg via INTRAVENOUS
  Filled 2020-02-07: qty 10

## 2020-02-07 MED ORDER — ACETAMINOPHEN 325 MG PO TABS
650.0000 mg | ORAL_TABLET | Freq: Once | ORAL | Status: AC
  Administered 2020-02-07: 650 mg via ORAL

## 2020-02-07 MED ORDER — ACETAMINOPHEN 325 MG PO TABS
ORAL_TABLET | ORAL | Status: AC
  Filled 2020-02-07: qty 2

## 2020-02-07 MED ORDER — ONDANSETRON HCL 8 MG PO TABS: 8.0000 mg | ORAL_TABLET | Freq: Two times a day (BID) | ORAL | 1 refills | Status: DC | PRN

## 2020-02-07 MED ORDER — HYDROCODONE-ACETAMINOPHEN 5-325 MG PO TABS
1.0000 | ORAL_TABLET | Freq: Four times a day (QID) | ORAL | 0 refills | Status: DC | PRN
Start: 2020-02-07 — End: 2020-03-06

## 2020-02-07 MED ORDER — SODIUM CHLORIDE 0.9% FLUSH
10.0000 mL | Freq: Once | INTRAVENOUS | Status: AC | PRN
  Administered 2020-02-07: 10 mL
  Filled 2020-02-07: qty 10

## 2020-02-07 MED ORDER — DIPHENHYDRAMINE HCL 25 MG PO CAPS
ORAL_CAPSULE | ORAL | Status: AC
  Filled 2020-02-07: qty 1

## 2020-02-07 MED FILL — ONDANSETRON HCL 8 MG TABLET: 8 | 15 days supply | Qty: 30 | Fill #0

## 2020-02-07 MED FILL — PROCHLORPERAZINE 10 MG TAB: 10 | 7 days supply | Qty: 30 | Fill #0

## 2020-02-07 NOTE — Patient Instructions (Signed)

## 2020-02-07 NOTE — Progress Notes (Signed)
Nutrition follow-up completed with patient during infusion for recurrent gastric cancer. Weight documented as 126 pounds stable from 125.1 pounds September 15. Patient reports nausea is not currently controlled because he needs additional medication. Reports 1-2 bowel movements every week but does not want to take laxatives.   He is eating small meals. He is drinking 1 Carnation breakfast essential daily. Noted labs: Potassium 3.1.  Nutrition diagnosis: Inadequate oral intake continues.  Intervention: Encourage patient to continue small frequent meals and snacks with high-calorie, high-protein foods. Increase Carnation breakfast essentials twice daily. Recommend prune juice daily to minimize constipation.  Monitoring, evaluation, goals: Patient will work to increase calories and protein to minimize weight loss.  Next visit: Wednesday, October 27 during infusion.  **Disclaimer: This note was dictated with voice recognition software. Similar sounding words can inadvertently be transcribed and this note may contain transcription errors which may not have been corrected upon publication of note.**

## 2020-02-07 NOTE — Progress Notes (Signed)
[name]   [phone] [phone]   Clinic Follow up Note   Patient Care Team: Wannetta Sender, FNP as PCP - General (Family Medicine) Truitt Merle, MD as Consulting Physician (Hematology) Alla Feeling, NP as Nurse Practitioner (Nurse Practitioner) Stark Klein, MD as Consulting Physician (General Surgery) Rogene Houston, MD as Consulting Physician (Gastroenterology) Milus Banister, MD as Attending Physician (Gastroenterology) Karie Mainland, RD as Dietitian (Nutrition)  Date of Service:  02/07/2020  CHIEF COMPLAINT: F/u on gastric cancer and IDA  SUMMARY OF ONCOLOGIC HISTORY: Oncology History Overview Note  Cancer Staging Malignant neoplasm of body of stomach ([name]) Staging form: Stomach, AJCC 8th Edition - Clinical stage from 03/11/2017: Stage IIB (cT3, cN0, cM0) - Unsigned - Pathologic stage from 04/22/2017: Stage IIIB (pT4a, pN3a, cM0) - Signed by Alla Feeling, NP on 05/17/2017     Malignant neoplasm of body of stomach ([name])  02/17/2017 Initial Diagnosis   Malignant neoplasm of body of stomach ([name])   02/17/2017 Pathology Results   Diagnosis Stomach, biopsy, gastric ulcer - ADENOCARCINOMA. Microscopic Comment Several of the fragments are involved by moderately differentiated adenocarcinoma.   02/17/2017 Procedure   UPPER ENDOSCOPY  FINDINGS - Normal esophagus. - Z-line irregular, 44 cm from the incisors. - Red blood in the gastric body and in the gastric antrum. - Large gastric ulcer. Biopsied. - Erythematous mucosa in the antrum. - Normal cardia, gastric fundus, gastric body and pylorus. - Normal duodenal bulb and second portion of the duodenum. Comment: Endoscopic appearence concerning for malignant ulcer.   03/11/2017 Procedure   UPPER EUS PER DR. Ardis Hughs Findings: 1. The esophagus was normal. 2. There was a 2-3cm ulcerated, malignant mass along the greater curvature of the stomach, approximately  mid-body. The mass was non-circumferential. 3. The duodenum was normal. Endosonographic Finding 1. The gastric mass above correlated with a hypoechoic and heterogenous non-circumferential mass that measured 2.9cm across, 19m deep. The endosonographic borders were poorly defined and there was clear sonographic evidence suggesting invasion into and through the muscularis propria layer without evident invasion into nearby organs (uT3). 2. The duodenal lymphnode described on recent CT scan appeared reactive by [place]. 3. No perigastric adenopathy (uN0) 4. Limited views of the liver, spleen, pancreas, bile duct, gallbladder were all normal. - Along the greater curvature of the stomach, approximately mid-body, there is a 2.9cm uT3N0 (clinical stage IIB) gastric adenocarcinoma.   04/06/2017 Imaging   CT CHEST IMPRESSION: 1. No acute cardiopulmonary abnormalities. 2. Two small pulmonary nodules are noted measuring up to 5 mm. Nonspecific but warrant attention on follow-up imaging follow up 3. Nonspecific hyperdense, possibly enhancing lesion along the dome of liver is identified. In a patient that is at increased risk for more definitive assessment of this structure with contrast enhanced MRI of the liver is advised.   04/21/2017 Imaging   MR ABDOMEN IMPRESSION: 1. Enhancing 2.9 cm mass along the lesser curvature in the gastric antrum, compatible with known gastric malignancy . 2. Mildly enlarged gastrohepatic ligament lymph node, cannot exclude nodal metastasis. 3. No definite liver metastatic disease. Hyperenhancing 0.9 cm liver dome mass remains inconclusive, although the MRI features are most suggestive of a flash filling hemangioma, and the mass has been stable for nearly 2 months. Additional subcentimeter focus of hyperenhancement in the lateral segment left liver lobe is most likely a benign transient vascular phenomenon. Recommend attention to these lesions on a follow-up MRI  abdomen without and with IV contrast in  3-6 months. This recommendation follows ACR consensus guidelines: Management of Incidental Liver Lesions on CT: A White Paper of the ACR Incidental Findings Committee. J Am Coll Radiol 2017; 27:0350-0938. 4. Benign right adrenal adenoma.     04/22/2017 Pathology Results   Diagnosis 1. Liver, biopsy - BILE DUCT HAMARTOMA. - THERE IS NO EVIDENCE OF MALIGNANCY. 2. Lymph nodes, regional resection, portal - METASTATIC CARCINOMA IN 2 OF 6 LYMPH NODES (2/6). 3. Stomach, resection for tumor, distal - INVASIVE ADENOCARCINOMA, POORLY DIFFERENTIATED, SPANNING 3.8 CM. - PERINEURAL INVASION IS IDENTIFIED. - ADENOCARCINOMA INVOLVES THE SEROSA. - METASTATIC CARCINOMA IN 12 OF 30 LYMPH NODES (12/30), WITH EXTRACAPSULAR EXTENSION. - SEE ONCOLOGY TABLE BELOW. 4. Lymph node, biopsy, common hepatic artery - THERE IS NO EVIDENCE OF CARCINOMA IN 1 OF 1 LYMPH NODE (0/1). Microscopic Comment 3. STOMACH: Specimen: Stomach. Procedure: Partial gastrectomy. Tumor Site: Greater curvature. Tumor Size: 3.8 cm Histologic Type: Adenocarcinoma. Histologic Grade: G3: poorly differentiated. Microscopic Extent of Tumor: Adenocarcinoma involves the serosa. Margins (select all that apply): Adenoca Proximal Margin: Negative for adenocarcinoma. Distal Margin: Negative for adenocarcinoma. Treatment Effect: N/A Lymph-Vascular Invasion: Not identified. Perineural Invasion: Present. Additional findings: Chronic gastritis. Ancillary testing: Can be performed upon clinician request. 1 of 3 Duplicate copy FINAL for [name], [name] ([id number]) Microscopic Comment(continued) Lymph nodes: number examined - 37; number positive: 14 Pathologic Staging: pT4a, pN3a (JBK:gt, 04/27/17)   06/02/2017 - 09/30/2017 Adjuvant Chemotherapy   FOLFOX every 2 weeks, oxaliplatin stopped in March 2019 due to neuropathy, chemo stopped on 09/30/2017 per pt's request    10/29/2017 Imaging    10/29/2017 CT CAP  IMPRESSION: 1. Status post distal gastrectomy. No evidence for metastatic disease in the chest, abdomen, or pelvis. 2. Stable tiny right pulmonary nodules. Continued attention on follow-up recommended. 3. 9 mm hypervascular lesion in the dome of the liver is stable over multiple studies back to 04/05/2017. Continued attention on follow-up suggested. 4. Ascending thoracic aorta measures 4.2 cm diameter. Recommend annual imaging followup by CTA or MRA. This recommendation follows 2010 ACCF/AHA/AATS/ACR/ASA/SCA/SCAI/SIR/STS/SVM Guidelines for the Diagnosis and Management of Patients with Thoracic Aortic Disease. Circulation. 2010; 121: R678-L381 . 5. Marked prostatomegaly. 6.  Aortic Atherosclerois (ICD10-170.0)   05/09/2018 Imaging   05/09/2018 CT CAP IMPRESSION: 1. No definite findings to suggest metastatic disease to the chest, abdomen or pelvis. 2. Multiple small pulmonary nodules scattered throughout the lungs bilaterally measuring 5 mm or less in size, stable compared to prior studies from 2018, favored to be benign. Continued attention on future follow-up examinations is recommended. 3. Aortic atherosclerosis with ectasia of the ascending thoracic aorta (4 cm in diameter). Recommend annual imaging followup by CTA or MRA. This recommendation follows 2010 ACCF/AHA/AATS/ACR/ASA/SCA/SCAI/SIR/STS/SVM Guidelines for the Diagnosis and Management of Patients with Thoracic Aortic Disease. Circulation. 2010; 121: O175-Z025. 4. Multiple small calculi lying dependently in the right-side of the urinary bladder. 5. Severe prostatomegaly. 6. Additional incidental findings, as above.   04/04/2019 Imaging   CT AP IMPRESSION: 1. Redemonstrated postoperative findings of distal gastrectomy and gastrojejunostomy (series 2, image 18, series 4, image 27). No evidence of malignant recurrence or metastatic disease in the abdomen or pelvis. 2. Subcutaneous body fat is  substantially diminished in comparison to prior examination, in keeping with reported history of weight loss. 3. Gross prostatomegaly and urinary bladder wall thickening, likely due to chronic outlet obstruction. 4.  Aortic Atherosclerosis (ICD10-I70.0).   08/16/2019 Imaging   CT CAP w contrast IMPRESSION: 1. No acute findings within the chest, abdomen or pelvis.  No specific findings identified to suggest residual or recurrent tumor or metastatic disease. 2. Small nonspecific pulmonary nodules are unchanged. 3. Marked enlargement of the prostate gland. 4. Multiple tiny bladder stones. 5. Aortic atherosclerosis. Aortic Atherosclerosis (ICD10-I70.0).   11/02/2019 Procedure   EGD impression - Normal hypopharynx. - Normal esophagus. - Z-line regular, 45 cm from the incisors. - Patent Billroth II gastrojejunostomy was found. 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 Biopsied. - Gastric ulcer with raised margins appears to be separate lesion. Biopsied.  comment: recurrent tumor at anastamosis appears to be separate form fundal lesion. these lesions are the source of chronic blood loss.  Colonoscopy impression: - Perianal skin tags found on perianal exam. - The entire examined colon is normal. - External and internal hemorrhoids. - No specimens collected.   11/02/2019 Pathology Results   FINAL MICROSCOPIC DIAGNOSIS:  A. STOMACH, BIOPSY:  -  Poorly differentiated carcinoma  -  See comment  B. STOMACH, FUNDUS, BIOPSY:  -  Adenocarcinoma  -  See comment  COMMENT:  A and B.  The carcinoma in part A is more poorly differentiated than  what is seen in part B but both are favored to be adenocarcinoma.  Dr.  Jeannie Done reviewed the case and agrees with the above diagnosis. Dr.  Laural Golden was notified of these results on November 03, 2019.    12/05/2019 PET scan   IMPRESSION: Marked hypermetabolic activity within the thickened stomach in this patient with known gastric cancer with associated  metastatic disease to the liver and small lymph nodes in the area of the hepatic gastric recess that are suspicious based on location and size though not particularly FDG avid.   Small lymph nodes along the posterolateral margin of the aorta with mild increased FDG uptake (image 107) (SUVmax = 2.8 these are approximately 6-7 mm and there are several small nodes tracking beneath the LEFT retrocrural region. Attention on follow-up)   Increased FDG uptake in the central portion of the prostate likely within the prosthetic urethra given the marked hypermetabolic features in the central location. Would however consider correlation with PSA as clinically warranted. Prostate is markedly enlarged otherwise with heterogeneous FDG uptake.   Aortic Atherosclerosis (ICD10-I70.0).    12/07/2019 -  Chemotherapy   FOLFOX every 2 weeks starting 12/07/19. Add Transtuzumab (Kanjinti) q2 weeks and Keytruda q6weeks with C3 on 01/24/20.    12/14/2019 Pathology Results   FINAL MICROSCOPIC DIAGNOSIS:   A. LIVER, NEEDLE CORE BIOPSY:  - Poorly differentiated adenocarcinoma, see comment.   COMMENT:   The tumor has a similar appearance to the patient's recent gastric  biopsies ([id number]). There is sufficient material for additional  testing if requested.       CURRENT THERAPY:  FOLFOX every 2 weeks starting 12/07/19. Add Transtuzumab (Kanjinti) q2 weeks and Keytruda q6weeks with C3 on 01/24/20.   INTERVAL HISTORY:  Gabriel Poole is here for a follow up. He notes when his girlfriend had an overdose the EMT took all the medication in his place including his. She is still in the hospital. He notes needs refill for antiemetics and notes refills have been costing him a lot of money. He notes he used a [employer] on medicines 2 years ago and has not used it since. He thinks he has money remaining to use. He notes he still works 20-30 hours at [employer]. He notes he has LUQ pain after eating. He notes he has  been able to eat enough to gain weight. If he  eats so much he will have N&V. He is taking Mirtazapine which helps. He takes carnation breakfast and has help cooking from family.    REVIEW OF SYSTEMS:   Constitutional: Denies fevers, chills or abnormal weight loss Eyes: Denies blurriness of vision Ears, nose, mouth, throat, and face: Denies mucositis or sore throat Respiratory: Denies cough, dyspnea or wheezes Cardiovascular: Denies palpitation, chest discomfort or lower extremity swelling Gastrointestinal:  Denies heartburn or change in bowel habits (+) Nausea, post prandial.  Skin: Denies abnormal skin rashes Lymphatics: Denies new lymphadenopathy or easy bruising Neurological:Denies numbness, tingling or new weaknesses Behavioral/Psych: Mood is stable, no new changes  All other systems were reviewed with the patient and are negative.  MEDICAL HISTORY:  Past Medical History:  Diagnosis Date  . Arthritis   . Cancer [facility]) dx oct 02-2017   stomach  . Gout   . Heart murmur   . Hyperlipidemia   . Hypertension   . Stomach cancer [facility])     SURGICAL HISTORY: Past Surgical History:  Procedure Laterality Date  . BIOPSY  11/02/2019   Procedure: BIOPSY;  Surgeon: Rogene Houston, MD;  [facility];  Service: Endoscopy;;  gastric  . COLONOSCOPY    . COLONOSCOPY N/A 11/02/2019   Procedure: COLONOSCOPY;  Surgeon: Rogene Houston, MD;  [facility];  Service: Endoscopy;  Laterality: N/A;  100  . ESOPHAGOGASTRODUODENOSCOPY N/A 02/17/2017   Procedure: ESOPHAGOGASTRODUODENOSCOPY (EGD);  Surgeon: Rogene Houston, MD;  [facility];  Service: Endoscopy;  Laterality: N/A;  3:00  . ESOPHAGOGASTRODUODENOSCOPY N/A 11/02/2019   Procedure: ESOPHAGOGASTRODUODENOSCOPY (EGD);  Surgeon: Rogene Houston, MD;  [facility];  Service: Endoscopy;  Laterality: N/A;  . EUS N/A 03/11/2017   Procedure: UPPER ENDOSCOPIC ULTRASOUND (EUS) LINEAR;  Surgeon: Milus Banister, MD;  [facility];  Service: Endoscopy;  Laterality: N/A;  . EUS N/A 03/11/2017   Procedure: UPPER ENDOSCOPIC ULTRASOUND (EUS) RADIAL;  Surgeon: Milus Banister, MD;  [facility];  Service: Endoscopy;  Laterality: N/A;  . FINGER SURGERY     Lt middle   . GASTRECTOMY N/A 04/22/2017   Procedure: PARTIAL GASTRECTOMY;  Surgeon: Stark Klein, MD;  Location: [place];  Service: General;  Laterality: N/A;  . GASTROJEJUNOSTOMY N/A 04/22/2017   Procedure: JEJUNAL FEEDING TUBE PLACEMENT;  Surgeon: Stark Klein, MD;  Location: [place];  Service: General;  Laterality: N/A;  . HYDROCELE EXCISION  02/12/2011   Procedure: HYDROCELECTOMY ADULT;  Surgeon: Marissa Nestle;  Location: [facility];  Service: Urology;  Laterality: Left;  . LAPAROSCOPY N/A 04/22/2017   Procedure: LAPAROSCOPY DIAGNOSTIC ERAS PATHWAY;  Surgeon: Stark Klein, MD;  Location: [place];  Service: General;  Laterality: N/A;  EPIDURAL  . PORTACATH PLACEMENT N/A 05/28/2017   Procedure: INSERTION PORT-A-CATH ERAS PATHWAY;  Surgeon: Stark Klein, MD;  Location: [place];  Service: General;  Laterality: N/A;    I have reviewed the social history and family history with the patient and they are unchanged from previous note.  ALLERGIES:  is allergic to penicillins.  MEDICATIONS:  Current Outpatient Medications  Medication Sig Dispense Refill  . amLODipine (NORVASC) 10 MG tablet Take 1 tablet (10 mg total) by mouth daily. 90 tablet 0  . atorvastatin (LIPITOR) 40 MG tablet Take 1 tablet (40 mg total) by mouth daily at 6 PM. 90 tablet 0  . Cholecalciferol (VITAMIN D3) 5000 units CAPS Take 5,000 Units by mouth daily.    Marland Kitchen  finasteride (PROSCAR) 5 MG tablet Take 5 mg by mouth daily.    Marland Kitchen HYDROcodone-acetaminophen (NORCO/VICODIN) 5-325 MG tablet Take 1 tablet by mouth every 6 (six) hours as needed for moderate pain. 60 tablet 0  . lidocaine-prilocaine (EMLA) cream Apply to affected area once 30 g 3  . megestrol  (MEGACE ES) 625 MG/5ML suspension Take 5 mLs (625 mg total) by mouth daily. 150 mL 0  . ondansetron (ZOFRAN) 8 MG tablet Take 1 tablet (8 mg total) by mouth 2 (two) times daily as needed for refractory nausea / vomiting. Start on day 3 after chemotherapy. 30 tablet 1  . potassium chloride SA (K-DUR,KLOR-CON) 20 MEQ tablet Take 1 tablet (20 mEq total) by mouth 2 (two) times daily. 40 tablet 1  . prochlorperazine (COMPAZINE) 10 MG tablet Take 1 tablet (10 mg total) by mouth every 6 (six) hours as needed (Nausea or vomiting). 30 tablet 1  . tadalafil (CIALIS) 5 MG tablet Take 5 mg by mouth daily.    . tamsulosin (FLOMAX) 0.4 MG CAPS capsule Take 0.4 mg by mouth daily.    Marland Kitchen triamterene-hydrochlorothiazide (MAXZIDE-25) 37.5-25 MG tablet Take 1 tablet by mouth daily.     No current facility-administered medications for this visit.   Facility-Administered Medications Ordered in Other Visits  Medication Dose Route Frequency Provider Last Rate Last Admin  . 0.9 %  sodium chloride infusion   Intravenous Continuous Truitt Merle, MD 20 mL/hr at 02/07/20 0925 New Bag at 02/07/20 0925  . dexamethasone (DECADRON) 10 mg in sodium chloride 0.9 % 50 mL IVPB  10 mg Intravenous Once Truitt Merle, MD 204 mL/hr at 02/07/20 0940 10 mg at 02/07/20 0940  . dextrose 5 % solution   Intravenous Once Truitt Merle, MD      . fluorouracil (ADRUCIL) 4,150 mg in sodium chloride 0.9 % 67 mL chemo infusion  2,400 mg/m2 (Treatment Plan Recorded) Intravenous 1 day or 1 dose Truitt Merle, MD      . heparin lock flush 100 unit/mL  500 Units Intracatheter Once PRN Truitt Merle, MD      . leucovorin 692 mg in dextrose 5 % 250 mL infusion  400 mg/m2 (Treatment Plan Recorded) Intravenous Once Truitt Merle, MD      . oxaliplatin (ELOXATIN) 120 mg in dextrose 5 % 500 mL chemo infusion  70 mg/m2 (Treatment Plan Recorded) Intravenous Once Truitt Merle, MD      . sodium chloride flush (NS) 0.9 % injection 10 mL  10 mL Intracatheter PRN Truitt Merle, MD      .  Theot[facility] Burrow [facility]) 231 mg in sodium chloride 0.9 % 250 mL chemo infusion  231 mg Intravenous Once Truitt Merle, MD        PHYSICAL EXAMINATION: ECOG PERFORMANCE STATUS: 1 - Symptomatic but completely ambulatory  Vitals:   02/07/20 0849  BP: (!) 159/78  Pulse: 65  Resp: 20  Temp: 98.1 F (36.7 C)  SpO2: 100%   Filed Weights   02/07/20 0849  Weight: 126 lb 4.8 oz (57.3 kg)    Due to COVID19 we will limit examination to appearance. Patient had no complaints.  GENERAL:alert, no distress and comfortable SKIN: skin color normal, no rashes or significant lesions EYES: normal, Conjunctiva are pink and non-injected, sclera clear  NEURO: alert & oriented x 3 with fluent speech   LABORATORY DATA:  I have reviewed the data as listed CBC Latest Ref Rng & Units 02/07/2020 01/24/2020 12/27/2019  WBC 4.0 - 10.5 K/uL 5.7 8.4 6.0  Hemoglobin 13.0 - 17.0 g/dL 9.8(L) 10.4(L) 10.9(L)  Hematocrit 39 - 52 % 30.7(L) 32.7(L) 34.3(L)  Platelets 150 - 400 K/uL 252 244 259     CMP Latest Ref Rng & Units 02/07/2020 01/24/2020 12/27/2019  Glucose 70 - 99 mg/dL 78 79 77  BUN 8 - 23 mg/dL _0 Creatinine 0.61 - 1.24 mg/dL 0.92 0.84 0.91  Sodium 135 - 145 mmol/L 142 140 139  Potassium 3.5 - 5.1 mmol/L 3.1(L) 3.5 3.7  Chloride 98 - 111 mmol/L 113(H) 109 109  CO2 22 - 32 mmol/L 26 26 20(L)  Calcium 8.9 - 10.3 mg/dL 8.7(L) 9.0 9.4  Total Protein 6.5 - 8.1 g/dL 5.5(L) 6.0(L) 5.9(L)  Total Bilirubin 0.3 - 1.2 mg/dL 0.4 0.5 0.4  Alkaline Phos 38 - 126 U/L 73 88 92  AST 15 - 41 U/L 14(L) 14(L) 13(L)  ALT 0 - 44 U/L _1 RADIOGRAPHIC STUDIES: I have personally reviewed the radiological images as listed and agreed with the findings in the report. No results found.   ASSESSMENT & PLAN:  Gabriel Poole is a 74 y.o. male with    1. Primary adenocarcinoma of the pyloric antrum, pT4a, PN3aM0, stage IIIB, Grade3, MSI-stable, recurrence at the anastomosis and newadenocarcinomain the  gastric fundus in 10/2019, oligo liver metastasis 11/2019, HER2(+)  -Initially diagnosed in 02/2017.S/psurgical resection and adjuvant chemotherapyFOLFOX. Patient was not compliant with chemo, oxaliplatin stopped after 4 cycles, and 5-FU stopped after 8 cycles treatment per his request. -He developed weight loss and recurrent IDA recently. His 10/2019 GI work up by Dr. Laural Golden and path shows recurrent poorly differentiatedadenocarcinomaat the anastomosis with additional focus of adeno in the gastric fundus.  -I previously discussed his case in GI Tumor Board and Dr. Barry Dienes did not offer surgery.  -His 12/14/19 liver biopsy shows metastatic cancer. I discussed this indicated a worse prognosis.  -His tumor is negative for PD-l1 and EBV, MMR still pending, HER2(+), so he is a candidate for trastuzumab.  -He started first line chemo FOLFOX q2weeks on 12/07/19. I added Trastuzumab q2weeks and Keytruda q6weeks with C3 on 01/24/20. Goal of care is to control his disease and prolong his life.  -He is tolerating treatment well and stable. Labs reviewed and adequate to proceed with FOLFOX and Trastuzumab today and continue every 2 weeks.  -Continue Keytruda every 6 weeks.  -Plan to repeat scan end of 02/2020.  -F/u in 2 weeks    2. Recurrent iron deficiency anemia, secondary to cancer recurrence -He initially had low serum iron and low transferrin saturation in 06/2017 after cancer resection; iron and Ferritin were normal in the interval. -He developed worsening anemia in 03/2019, Hg 10.2 on 11/19, ferritin 12, serum iron 21 and 6% transferrin saturation, consistent with iron deficiency -He has required blood transfusion on 08/03/19 and 10/25/19. He has received IV Feraheme 5 times since 08/08/19.  -GI work up in 11/02/19 with Dr Laural Golden showed this to be secondary to local cancer recurrence.  -Currently mild and stable anemia. Will monitor on chemo.   3. Weight loss, Nausea -secondary to cancer  recurrence. -Recently he has not been eating as much as no one at his home cooks. He gets help from daughter and ex-wife. I recommend he eat more frequent small meals and increase nutritional supplements.  -Continue to f/u with dietician.  -For post-prandial nausea he will continue Zofran and Compazine.   4.Ascendingaortic aneurysm. Will monitor on future scans  5. Smokingcessation -Hesmokes less now, 1 cigarette a day occasionally.  6. BPH and elevated PSA -He has nocturia and urinary frequency, history of BPH, and elevated PSA in July 2019 -He was previously seen by urologist before and had prostate shaved.  -PriorCT scan findings showed severe prostatomegaly. PET from 12/05/19 showed marked hypermetabolic uptake in this area. I will repeat PSA, will hold on biopsy for now due to his metastatic gastric cancer.   7. 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 This is currently manageable.  -Will monitor on restart of lower dose FOLFOX. No progressive neuropathy so far on FOLFOX.   8. Abdominal Pain, secondary to #1, Constipation  -For abdominal pain he is currently on Norco -For constipation he can use Miralax.  -Stable.   Plan -I refilled his zofran and Compazine at [place] reviewed and adequate to proceed with C4 FOLFOX and Trastuzumab today  -Lab, flush, f/u and FOLFOX and Transtuzumab in 2, 4, 6 weeks -continue Keytruda every 6 weeks    No problem-specific Assessment & Plan notes found for this encounter.   No orders of the defined types were placed in this encounter.  All questions were answered. The patient knows to call the clinic with any problems, questions or concerns. No barriers to learning was detected. The total time spent in the appointment was 30 minutes.     Truitt Merle, MD 02/07/2020   I, Joslyn Devon, am acting as scribe for Truitt Merle, MD.   I have reviewed  the above documentation for accuracy and completeness, and I agree with the above.

## 2020-02-07 NOTE — Patient Instructions (Addendum)
[facility] Discharge Instructions for Patients Receiving Chemotherapy  Today you received the following chemotherapy agents: Oxaliplatin, Leucovorin, and Fluorouracil, herceptin.  To help prevent nausea and vomiting after your treatment, we encourage you to take your nausea medication as prescribed.    If you develop nausea and vomiting that is not controlled by your nausea medication, call the clinic.   BELOW ARE SYMPTOMS THAT SHOULD BE REPORTED IMMEDIATELY:  *FEVER GREATER THAN 100.5 F  *CHILLS WITH OR WITHOUT FEVER  NAUSEA AND VOMITING THAT IS NOT CONTROLLED WITH YOUR NAUSEA MEDICATION  *UNUSUAL SHORTNESS OF BREATH  *UNUSUAL BRUISING OR BLEEDING  TENDERNESS IN MOUTH AND THROAT WITH OR WITHOUT PRESENCE OF ULCERS  *URINARY PROBLEMS  *BOWEL PROBLEMS  UNUSUAL RASH Items with * indicate a potential emergency and should be followed up as soon as possible.  Feel free to call the clinic should you have any questions or concerns. The clinic phone number is [phone].  Please show the [facility] at check-in to the Emergency Department and triage nurse.

## 2020-02-09 ENCOUNTER — Telehealth: Payer: Self-pay | Admitting: Hematology

## 2020-02-09 ENCOUNTER — Telehealth: Payer: Self-pay | Admitting: *Deleted

## 2020-02-09 ENCOUNTER — Other Ambulatory Visit: Payer: Self-pay | Admitting: *Deleted

## 2020-02-09 ENCOUNTER — Other Ambulatory Visit: Payer: Self-pay

## 2020-02-09 ENCOUNTER — Inpatient Hospital Stay: Payer: Medicare HMO | Attending: Hematology

## 2020-02-09 DIAGNOSIS — Z79899 Other long term (current) drug therapy: Secondary | ICD-10-CM | POA: Diagnosis not present

## 2020-02-09 DIAGNOSIS — N401 Enlarged prostate with lower urinary tract symptoms: Secondary | ICD-10-CM | POA: Diagnosis not present

## 2020-02-09 DIAGNOSIS — C787 Secondary malignant neoplasm of liver and intrahepatic bile duct: Secondary | ICD-10-CM | POA: Insufficient documentation

## 2020-02-09 DIAGNOSIS — D509 Iron deficiency anemia, unspecified: Secondary | ICD-10-CM | POA: Diagnosis not present

## 2020-02-09 DIAGNOSIS — C163 Malignant neoplasm of pyloric antrum: Secondary | ICD-10-CM | POA: Insufficient documentation

## 2020-02-09 DIAGNOSIS — Z5111 Encounter for antineoplastic chemotherapy: Secondary | ICD-10-CM | POA: Insufficient documentation

## 2020-02-09 DIAGNOSIS — G629 Polyneuropathy, unspecified: Secondary | ICD-10-CM | POA: Diagnosis not present

## 2020-02-09 DIAGNOSIS — R9721 Rising PSA following treatment for malignant neoplasm of prostate: Secondary | ICD-10-CM | POA: Diagnosis not present

## 2020-02-09 DIAGNOSIS — F17210 Nicotine dependence, cigarettes, uncomplicated: Secondary | ICD-10-CM | POA: Insufficient documentation

## 2020-02-09 DIAGNOSIS — C162 Malignant neoplasm of body of stomach: Secondary | ICD-10-CM

## 2020-02-09 DIAGNOSIS — Z95828 Presence of other vascular implants and grafts: Secondary | ICD-10-CM

## 2020-02-09 MED ORDER — HEPARIN SOD (PORK) LOCK FLUSH 100 UNIT/ML IV SOLN
500.0000 [IU] | Freq: Once | INTRAVENOUS | Status: AC | PRN
  Administered 2020-02-09: 500 [IU] via INTRAVENOUS
  Filled 2020-02-09: qty 5

## 2020-02-09 MED ORDER — SODIUM CHLORIDE 0.9% FLUSH
10.0000 mL | INTRAVENOUS | Status: DC | PRN
  Administered 2020-02-09: 10 mL via INTRAVENOUS
  Filled 2020-02-09: qty 10

## 2020-02-09 MED ORDER — SODIUM CHLORIDE 0.9 % IV SOLN
Freq: Once | INTRAVENOUS | Status: DC
  Filled 2020-02-09: qty 250

## 2020-02-09 NOTE — Telephone Encounter (Signed)
Scheduled appt per 10/1 sch msg - left message for pt with apt date and time

## 2020-02-09 NOTE — Telephone Encounter (Signed)
Pt states he has "been throwing up all week, I'm hurting on my left side". RN reviewed use of antiemetics, zofran and compazine. Pt has only taken 2 tablets.  Pain med refill sent to pharmacy by Dr Burr Medico on 9/29- he will go pick up. Offered IVF tomorrow- Pt states he can come in on Monday for IVF.  Orders placed, message to scheduler.

## 2020-02-12 ENCOUNTER — Encounter: Payer: Self-pay | Admitting: Hematology

## 2020-02-12 ENCOUNTER — Other Ambulatory Visit: Payer: Self-pay | Admitting: Hematology

## 2020-02-12 ENCOUNTER — Inpatient Hospital Stay (HOSPITAL_BASED_OUTPATIENT_CLINIC_OR_DEPARTMENT_OTHER): Payer: Medicare HMO | Admitting: Hematology

## 2020-02-12 ENCOUNTER — Other Ambulatory Visit: Payer: Self-pay

## 2020-02-12 ENCOUNTER — Inpatient Hospital Stay: Payer: Medicare HMO

## 2020-02-12 VITALS — BP 117/75 | HR 58 | Temp 99.1°F | Resp 16

## 2020-02-12 DIAGNOSIS — C162 Malignant neoplasm of body of stomach: Secondary | ICD-10-CM | POA: Diagnosis not present

## 2020-02-12 DIAGNOSIS — Z5111 Encounter for antineoplastic chemotherapy: Secondary | ICD-10-CM | POA: Diagnosis not present

## 2020-02-12 DIAGNOSIS — R1013 Epigastric pain: Secondary | ICD-10-CM | POA: Diagnosis not present

## 2020-02-12 DIAGNOSIS — Z95828 Presence of other vascular implants and grafts: Secondary | ICD-10-CM

## 2020-02-12 MED ORDER — ONDANSETRON HCL 4 MG/2ML IJ SOLN
8.0000 mg | Freq: Once | INTRAMUSCULAR | Status: AC
  Administered 2020-02-12: 8 mg via INTRAVENOUS

## 2020-02-12 MED ORDER — ONDANSETRON HCL 4 MG/2ML IJ SOLN
INTRAMUSCULAR | Status: AC
  Filled 2020-02-12: qty 4

## 2020-02-12 MED ORDER — SODIUM CHLORIDE 0.9% FLUSH
10.0000 mL | INTRAVENOUS | Status: DC | PRN
  Administered 2020-02-12: 10 mL via INTRAVENOUS
  Filled 2020-02-12: qty 10

## 2020-02-12 MED ORDER — SODIUM CHLORIDE 0.9 % IV SOLN: Freq: Once | INTRAVENOUS | Status: DC

## 2020-02-12 MED ORDER — SODIUM CHLORIDE 0.9 % IV SOLN
Freq: Once | INTRAVENOUS | Status: AC
  Filled 2020-02-12: qty 250

## 2020-02-12 MED ORDER — PANTOPRAZOLE SODIUM 40 MG PO TBEC: 40.0000 mg | DELAYED_RELEASE_TABLET | Freq: Every day | ORAL | 2 refills | Status: DC

## 2020-02-12 MED ORDER — HEPARIN SOD (PORK) LOCK FLUSH 100 UNIT/ML IV SOLN
500.0000 [IU] | Freq: Once | INTRAVENOUS | Status: AC | PRN
  Administered 2020-02-12: 500 [IU] via INTRAVENOUS
  Filled 2020-02-12: qty 5

## 2020-02-12 NOTE — Progress Notes (Signed)
[facility]   [phone] [phone]   Clinic Follow up Note   Patient Care Team: Wannetta Sender, FNP as PCP - General (Family Medicine) Truitt Merle, MD as Consulting Physician (Hematology) Alla Feeling, NP as Nurse Practitioner (Nurse Practitioner) Stark Klein, MD as Consulting Physician (General Surgery) Rogene Houston, MD as Consulting Physician (Gastroenterology) Milus Banister, MD as Attending Physician (Gastroenterology) Karie Mainland, RD as Dietitian (Nutrition)  Date of Service:  02/12/2020  CHIEF COMPLAINT: abdominal pain and nausea   SUMMARY OF ONCOLOGIC HISTORY: Oncology History Overview Note  Cancer Staging Malignant neoplasm of body of stomach ([place]) Staging form: Stomach, AJCC 8th Edition - Clinical stage from 03/11/2017: Stage IIB (cT3, cN0, cM0) - Unsigned - Pathologic stage from 04/22/2017: Stage IIIB (pT4a, pN3a, cM0) - Signed by Alla Feeling, NP on 05/17/2017     Malignant neoplasm of body of stomach ([place])  02/17/2017 Initial Diagnosis   Malignant neoplasm of body of stomach ([employer])   02/17/2017 Pathology Results   Diagnosis Stomach, biopsy, gastric ulcer - ADENOCARCINOMA. Microscopic Comment Several of the fragments are involved by moderately differentiated adenocarcinoma.   02/17/2017 Procedure   UPPER ENDOSCOPY  FINDINGS - Normal esophagus. - Z-line irregular, 44 cm from the incisors. - Red blood in the gastric body and in the gastric antrum. - Large gastric ulcer. Biopsied. - Erythematous mucosa in the antrum. - Normal cardia, gastric fundus, gastric body and pylorus. - Normal duodenal bulb and second portion of the duodenum. Comment: Endoscopic appearence concerning for malignant ulcer.   03/11/2017 Procedure   UPPER EUS PER DR. Ardis Hughs Findings: 1. The esophagus was normal. 2. There was a 2-3cm ulcerated, malignant mass along the greater curvature of the stomach, approximately mid-body.  The mass was non-circumferential. 3. The duodenum was normal. Endosonographic Finding 1. The gastric mass above correlated with a hypoechoic and heterogenous non-circumferential mass that measured 2.9cm across, 69m deep. The endosonographic borders were poorly defined and there was clear sonographic evidence suggesting invasion into and through the muscularis propria layer without evident invasion into nearby organs (uT3). 2. The duodenal lymphnode described on recent CT scan appeared reactive by [place]. 3. No perigastric adenopathy (uN0) 4. Limited views of the liver, spleen, pancreas, bile duct, gallbladder were all normal. - Along the greater curvature of the stomach, approximately mid-body, there is a 2.9cm uT3N0 (clinical stage IIB) gastric adenocarcinoma.   04/06/2017 Imaging   CT CHEST IMPRESSION: 1. No acute cardiopulmonary abnormalities. 2. Two small pulmonary nodules are noted measuring up to 5 mm. Nonspecific but warrant attention on follow-up imaging follow up 3. Nonspecific hyperdense, possibly enhancing lesion along the dome of liver is identified. In a patient that is at increased risk for more definitive assessment of this structure with contrast enhanced MRI of the liver is advised.   04/21/2017 Imaging   MR ABDOMEN IMPRESSION: 1. Enhancing 2.9 cm mass along the lesser curvature in the gastric antrum, compatible with known gastric malignancy . 2. Mildly enlarged gastrohepatic ligament lymph node, cannot exclude nodal metastasis. 3. No definite liver metastatic disease. Hyperenhancing 0.9 cm liver dome mass remains inconclusive, although the MRI features are most suggestive of a flash filling hemangioma, and the mass has been stable for nearly 2 months. Additional subcentimeter focus of hyperenhancement in the lateral segment left liver lobe is most likely a benign transient vascular phenomenon. Recommend attention to these lesions on a follow-up MRI abdomen  without and with IV contrast in 3-6  months. This recommendation follows ACR consensus guidelines: Management of Incidental Liver Lesions on CT: A White Paper of the ACR Incidental Findings Committee. J Am Coll Radiol 2017; 47:4259-5638. 4. Benign right adrenal adenoma.     04/22/2017 Pathology Results   Diagnosis 1. Liver, biopsy - BILE DUCT HAMARTOMA. - THERE IS NO EVIDENCE OF MALIGNANCY. 2. Lymph nodes, regional resection, portal - METASTATIC CARCINOMA IN 2 OF 6 LYMPH NODES (2/6). 3. Stomach, resection for tumor, distal - INVASIVE ADENOCARCINOMA, POORLY DIFFERENTIATED, SPANNING 3.8 CM. - PERINEURAL INVASION IS IDENTIFIED. - ADENOCARCINOMA INVOLVES THE SEROSA. - METASTATIC CARCINOMA IN 12 OF 30 LYMPH NODES (12/30), WITH EXTRACAPSULAR EXTENSION. - SEE ONCOLOGY TABLE BELOW. 4. Lymph node, biopsy, common hepatic artery - THERE IS NO EVIDENCE OF CARCINOMA IN 1 OF 1 LYMPH NODE (0/1). Microscopic Comment 3. STOMACH: Specimen: Stomach. Procedure: Partial gastrectomy. Tumor Site: Greater curvature. Tumor Size: 3.8 cm Histologic Type: Adenocarcinoma. Histologic Grade: G3: poorly differentiated. Microscopic Extent of Tumor: Adenocarcinoma involves the serosa. Margins (select all that apply): Adenoca Proximal Margin: Negative for adenocarcinoma. Distal Margin: Negative for adenocarcinoma. Treatment Effect: N/A Lymph-Vascular Invasion: Not identified. Perineural Invasion: Present. Additional findings: Chronic gastritis. Ancillary testing: Can be performed upon clinician request. 1 of 3 Duplicate copy FINAL for JAYION, SCHNECK ([id number]) Microscopic Comment(continued) Lymph nodes: number examined - 37; number positive: 14 Pathologic Staging: pT4a, pN3a (JBK:gt, 04/27/17)   06/02/2017 - 09/30/2017 Adjuvant Chemotherapy   FOLFOX every 2 weeks, oxaliplatin stopped in March 2019 due to neuropathy, chemo stopped on 09/30/2017 per pt's request    10/29/2017 Imaging   10/29/2017 CT  CAP  IMPRESSION: 1. Status post distal gastrectomy. No evidence for metastatic disease in the chest, abdomen, or pelvis. 2. Stable tiny right pulmonary nodules. Continued attention on follow-up recommended. 3. 9 mm hypervascular lesion in the dome of the liver is stable over multiple studies back to 04/05/2017. Continued attention on follow-up suggested. 4. Ascending thoracic aorta measures 4.2 cm diameter. Recommend annual imaging followup by CTA or MRA. This recommendation follows 2010 ACCF/AHA/AATS/ACR/ASA/SCA/SCAI/SIR/STS/SVM Guidelines for the Diagnosis and Management of Patients with Thoracic Aortic Disease. Circulation. 2010; 121: J884-Z660 . 5. Marked prostatomegaly. 6.  Aortic Atherosclerois (ICD10-170.0)   05/09/2018 Imaging   05/09/2018 CT CAP IMPRESSION: 1. No definite findings to suggest metastatic disease to the chest, abdomen or pelvis. 2. Multiple small pulmonary nodules scattered throughout the lungs bilaterally measuring 5 mm or less in size, stable compared to prior studies from 2018, favored to be benign. Continued attention on future follow-up examinations is recommended. 3. Aortic atherosclerosis with ectasia of the ascending thoracic aorta (4 cm in diameter). Recommend annual imaging followup by CTA or MRA. This recommendation follows 2010 ACCF/AHA/AATS/ACR/ASA/SCA/SCAI/SIR/STS/SVM Guidelines for the Diagnosis and Management of Patients with Thoracic Aortic Disease. Circulation. 2010; 121: Y301-S010. 4. Multiple small calculi lying dependently in the right-side of the urinary bladder. 5. Severe prostatomegaly. 6. Additional incidental findings, as above.   04/04/2019 Imaging   CT AP IMPRESSION: 1. Redemonstrated postoperative findings of distal gastrectomy and gastrojejunostomy (series 2, image 18, series 4, image 27). No evidence of malignant recurrence or metastatic disease in the abdomen or pelvis. 2. Subcutaneous body fat is substantially  diminished in comparison to prior examination, in keeping with reported history of weight loss. 3. Gross prostatomegaly and urinary bladder wall thickening, likely due to chronic outlet obstruction. 4.  Aortic Atherosclerosis (ICD10-I70.0).   08/16/2019 Imaging   CT CAP w contrast IMPRESSION: 1. No acute findings within the chest, abdomen or pelvis. No  specific findings identified to suggest residual or recurrent tumor or metastatic disease. 2. Small nonspecific pulmonary nodules are unchanged. 3. Marked enlargement of the prostate gland. 4. Multiple tiny bladder stones. 5. Aortic atherosclerosis. Aortic Atherosclerosis (ICD10-I70.0).   11/02/2019 Procedure   EGD impression - Normal hypopharynx. - Normal esophagus. - Z-line regular, 45 cm from the incisors. - Patent Billroth II gastrojejunostomy was found. 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 Biopsied. - Gastric ulcer with raised margins appears to be separate lesion. Biopsied.  comment: recurrent tumor at anastamosis appears to be separate form fundal lesion. these lesions are the source of chronic blood loss.  Colonoscopy impression: - Perianal skin tags found on perianal exam. - The entire examined colon is normal. - External and internal hemorrhoids. - No specimens collected.   11/02/2019 Pathology Results   FINAL MICROSCOPIC DIAGNOSIS:  A. STOMACH, BIOPSY:  -  Poorly differentiated carcinoma  -  See comment  B. STOMACH, FUNDUS, BIOPSY:  -  Adenocarcinoma  -  See comment  COMMENT:  A and B.  The carcinoma in part A is more poorly differentiated than  what is seen in part B but both are favored to be adenocarcinoma.  Dr.  Jeannie Done reviewed the case and agrees with the above diagnosis. Dr.  Laural Golden was notified of these results on November 03, 2019.    12/05/2019 PET scan   IMPRESSION: Marked hypermetabolic activity within the thickened stomach in this patient with known gastric cancer with associated metastatic  disease to the liver and small lymph nodes in the area of the hepatic gastric recess that are suspicious based on location and size though not particularly FDG avid.   Small lymph nodes along the posterolateral margin of the aorta with mild increased FDG uptake (image 107) (SUVmax = 2.8 these are approximately 6-7 mm and there are several small nodes tracking beneath the LEFT retrocrural region. Attention on follow-up)   Increased FDG uptake in the central portion of the prostate likely within the prosthetic urethra given the marked hypermetabolic features in the central location. Would however consider correlation with PSA as clinically warranted. Prostate is markedly enlarged otherwise with heterogeneous FDG uptake.   Aortic Atherosclerosis (ICD10-I70.0).    12/07/2019 -  Chemotherapy   FOLFOX every 2 weeks starting 12/07/19. Add Transtuzumab (Kanjinti) q2 weeks and Keytruda q6weeks with C3 on 01/24/20.    12/14/2019 Pathology Results   FINAL MICROSCOPIC DIAGNOSIS:   A. LIVER, NEEDLE CORE BIOPSY:  - Poorly differentiated adenocarcinoma, see comment.   COMMENT:   The tumor has a similar appearance to the patient's recent gastric  biopsies ([id number]). There is sufficient material for additional  testing if requested.       CURRENT THERAPY:  FOLFOX every 2 weeks starting 12/07/19. Add Transtuzumab (Kanjinti) q2 weeks and Keytruda q6weeks with C3 on 01/24/20.   INTERVAL HISTORY:  MOHSIN CRUM is scheduled for IVF and f/u today due his persistent abdominal pain, nausea and vomiting. He presents to the clinic alone. He notes having intermittent abdominal pain for the past month, worse in the last 2-3 days along with N&V. He notes BM otherwise normal. He has Norco but has not been taking them much as it can lead to constipation. He hs not used much antiemetics.    REVIEW OF SYSTEMS:   Constitutional: Denies fevers, chills or abnormal weight loss Eyes: Denies blurriness of  vision Ears, nose, mouth, throat, and face: Denies mucositis or sore throat Respiratory: Denies cough, dyspnea or wheezes Cardiovascular:  Denies palpitation, chest discomfort or lower extremity swelling Gastrointestinal:  Denies heartburn or change in bowel habits (+) Nausea and vomiting Skin: Denies abnormal skin rashes Lymphatics: Denies new lymphadenopathy or easy bruising Neurological:Denies numbness, tingling or new weaknesses Behavioral/Psych: Mood is stable, no new changes  All other systems were reviewed with the patient and are negative.  MEDICAL HISTORY:  Past Medical History:  Diagnosis Date  . Arthritis   . Cancer [facility]) dx oct 02-2017   stomach  . Gout   . Heart murmur   . Hyperlipidemia   . Hypertension   . Stomach cancer [facility])     SURGICAL HISTORY: Past Surgical History:  Procedure Laterality Date  . BIOPSY  11/02/2019   Procedure: BIOPSY;  Surgeon: Rogene Houston, MD;  [facility];  Service: Endoscopy;;  gastric  . COLONOSCOPY    . COLONOSCOPY N/A 11/02/2019   Procedure: COLONOSCOPY;  Surgeon: Rogene Houston, MD;  [facility];  Service: Endoscopy;  Laterality: N/A;  100  . ESOPHAGOGASTRODUODENOSCOPY N/A 02/17/2017   Procedure: ESOPHAGOGASTRODUODENOSCOPY (EGD);  Surgeon: Rogene Houston, MD;  [facility];  Service: Endoscopy;  Laterality: N/A;  3:00  . ESOPHAGOGASTRODUODENOSCOPY N/A 11/02/2019   Procedure: ESOPHAGOGASTRODUODENOSCOPY (EGD);  Surgeon: Rogene Houston, MD;  [facility];  Service: Endoscopy;  Laterality: N/A;  . EUS N/A 03/11/2017   Procedure: UPPER ENDOSCOPIC ULTRASOUND (EUS) LINEAR;  Surgeon: Milus Banister, MD;  Location[facility];  Service: Endoscopy;  Laterality: N/A;  . EUS N/A 03/11/2017   Procedure: UPPER ENDOSCOPIC ULTRASOUND (EUS) RADIAL;  Surgeon: Milus Banister, MD;  Location: [facility];  Service: Endoscopy;  Laterality: N/A;  . FINGER SURGERY     Lt middle   . GASTRECTOMY N/A  04/22/2017   Procedure: PARTIAL GASTRECTOMY;  Surgeon: Stark Klein, MD;  Location: [place];  Service: General;  Laterality: N/A;  . GASTROJEJUNOSTOMY N/A 04/22/2017   Procedure: JEJUNAL FEEDING TUBE PLACEMENT;  Surgeon: Stark Klein, MD;  Location: [place];  Service: General;  Laterality: N/A;  . HYDROCELE EXCISION  02/12/2011   Procedure: HYDROCELECTOMY ADULT;  Surgeon: Marissa Nestle;  Location: [facility];  Service: Urology;  Laterality: Left;  . LAPAROSCOPY N/A 04/22/2017   Procedure: LAPAROSCOPY DIAGNOSTIC ERAS PATHWAY;  Surgeon: Stark Klein, MD;  Location: [place];  Service: General;  Laterality: N/A;  EPIDURAL  . PORTACATH PLACEMENT N/A 05/28/2017   Procedure: INSERTION PORT-A-CATH ERAS PATHWAY;  Surgeon: Stark Klein, MD;  Location: [place];  Service: General;  Laterality: N/A;    I have reviewed the social history and family history with the patient and they are unchanged from previous note.  ALLERGIES:  is allergic to penicillins.  MEDICATIONS:  Current Outpatient Medications  Medication Sig Dispense Refill  . amLODipine (NORVASC) 10 MG tablet Take 1 tablet (10 mg total) by mouth daily. 90 tablet 0  . atorvastatin (LIPITOR) 40 MG tablet Take 1 tablet (40 mg total) by mouth daily at 6 PM. 90 tablet 0  . Cholecalciferol (VITAMIN D3) 5000 units CAPS Take 5,000 Units by mouth daily.    . finasteride (PROSCAR) 5 MG tablet Take 5 mg by mouth daily.    Marland Kitchen HYDROcodone-acetaminophen (NORCO/VICODIN) 5-325 MG tablet Take 1 tablet by mouth every 6 (six) hours as needed for moderate pain. 60 tablet 0  . lidocaine-prilocaine (EMLA) cream Apply to affected area once 30 g 3  . megestrol (MEGACE ES) 625 MG/5ML suspension Take 5 mLs (625 mg  total) by mouth daily. 150 mL 0  . ondansetron (ZOFRAN) 8 MG tablet Take 1 tablet (8 mg total) by mouth 2 (two) times daily as needed for refractory nausea / vomiting. Start on day 3 after chemotherapy. 30 tablet 1  . pantoprazole (PROTONIX) 40 MG  tablet Take 1 tablet (40 mg total) by mouth daily. 30 tablet 2  . potassium chloride SA (K-DUR,KLOR-CON) 20 MEQ tablet Take 1 tablet (20 mEq total) by mouth 2 (two) times daily. 40 tablet 1  . prochlorperazine (COMPAZINE) 10 MG tablet Take 1 tablet (10 mg total) by mouth every 6 (six) hours as needed (Nausea or vomiting). 30 tablet 1  . tadalafil (CIALIS) 5 MG tablet Take 5 mg by mouth daily.    . tamsulosin (FLOMAX) 0.4 MG CAPS capsule Take 0.4 mg by mouth daily.    Marland Kitchen triamterene-hydrochlorothiazide (MAXZIDE-25) 37.5-25 MG tablet Take 1 tablet by mouth daily.     No current facility-administered medications for this visit.   Facility-Administered Medications Ordered in Other Visits  Medication Dose Route Frequency Provider Last Rate Last Admin  . heparin lock flush 100 unit/mL  500 Units Intravenous Once PRN Truitt Merle, MD      . sodium chloride flush (NS) 0.9 % injection 10 mL  10 mL Intravenous PRN Truitt Merle, MD        PHYSICAL EXAMINATION: ECOG PERFORMANCE STATUS: 2 - Symptomatic, <50% confined to bed Blood pressure 170/75, heart rate 58, respirate 16, temperature 37.3, pulse ox 100% on RA  GENERAL:alert, no distress and comfortable SKIN: skin color, texture, turgor are normal, no rashes or significant lesions EYES: normal, Conjunctiva are pink and non-injected, sclera clear  NECK: supple, thyroid normal size, non-tender, without nodularity LYMPH:  no palpable lymphadenopathy in the cervical, axillary  LUNGS: clear to auscultation and percussion with normal breathing effort HEART: regular rate & rhythm and no murmurs and no lower extremity edema ABDOMEN:abdomen soft, non-tender and normal bowel sounds Musculoskeletal:no cyanosis of digits and no clubbing  NEURO: alert & oriented x 3 with fluent speech, no focal motor/sensory deficits  LABORATORY DATA:  I have reviewed the data as listed CBC Latest Ref Rng & Units 02/07/2020 01/24/2020 12/27/2019  WBC 4.0 - 10.5 K/uL 5.7 8.4 6.0    Hemoglobin 13.0 - 17.0 g/dL 9.8(L) 10.4(L) 10.9(L)  Hematocrit 39 - 52 % 30.7(L) 32.7(L) 34.3(L)  Platelets 150 - 400 K/uL 252 244 259     CMP Latest Ref Rng & Units 02/07/2020 01/24/2020 12/27/2019  Glucose 70 - 99 mg/dL 78 79 77  BUN 8 - 23 mg/dL _0 Creatinine 0.61 - 1.24 mg/dL 0.92 0.84 0.91  Sodium 135 - 145 mmol/L 142 140 139  Potassium 3.5 - 5.1 mmol/L 3.1(L) 3.5 3.7  Chloride 98 - 111 mmol/L 113(H) 109 109  CO2 22 - 32 mmol/L 26 26 20(L)  Calcium 8.9 - 10.3 mg/dL 8.7(L) 9.0 9.4  Total Protein 6.5 - 8.1 g/dL 5.5(L) 6.0(L) 5.9(L)  Total Bilirubin 0.3 - 1.2 mg/dL 0.4 0.5 0.4  Alkaline Phos 38 - 126 U/L 73 88 92  AST 15 - 41 U/L 14(L) 14(L) 13(L)  ALT 0 - 44 U/L _1 RADIOGRAPHIC STUDIES: I have personally reviewed the radiological images as listed and agreed with the findings in the report. No results found.   ASSESSMENT & PLAN:  COUNCIL MUNGUIA is a 74 y.o. male with    1. Abdominal pain, nausea and vomiting  -Secondary  to metastatic gastric cancer, and chemotherapy -We discussed symptom management.  He is afraid of using hydrocodone due to the concern of constipation.  I recommend him to use MiraLAX once daily in the morning, when he takes hydrocodone -will start him on protonix 69m daily  -We also reviewed antiemetics.  I recommend him Zofran once in the morning, and as needed, along with Compazine through the day -I encouraged him to take nutritional supplement and drink fluids adequately -He will get a normal saline 1 L today, with Zofran and dexamethasone  2. Primary adenocarcinoma of the pyloric antrum, pT4a, PN3aM0, stage IIIB, Grade3, MSI-stable, recurrence at the anastomosis and newadenocarcinomain the gastric fundus in 10/2019, oligo liver metastasis 11/2019, HER2(+) -Initially diagnosed in 02/2017.S/psurgical resection and adjuvant chemotherapyFOLFOX. Patient was not compliant with chemo, oxaliplatin stopped after 4 cycles, and 5-FU  stopped after 8 cycles treatment per his request. -He developed weight loss and recurrent IDA recently. His 10/2019 GI work up by Dr. RLaural Goldenand path shows recurrent poorly differentiatedadenocarcinomaat the anastomosis with additional focus of adeno in the gastric fundus.  -I previously discussed his case in GI Tumor Board and Dr. BBarry Dienesdid not offer surgery.  -His 12/14/19 liver biopsy shows metastatic cancer. I discussed this indicated a worse prognosis. -His tumor is negative for PD-l1 and EBV, MMR still pending, HER2(+), so he is a candidate for trastuzumab.  -He started first linechemo FOLFOX q2weeks on 12/07/19. I added Trastuzumab q2weeks and Keytruda q6weeks with C3 on 01/24/20. Goal of care is to control his disease and prolong his life.  -S/p C4 he continues to have abdominal pain along with N&V. I reviewed management with him.  -Overall adequate to proceed with C5 FOLFOX, Trastuzumab today.  -Continue Keytruda every 6 weeks.  -Plan to repeat scan end of 02/2020 -F/u next week for chemo, may need to reduce chemo dose due to his tolerance issue    PLAN:  -I called in Protonix today  -IVF 1L, zofran and dexa today for supportive care -f/u next week before chemo   No problem-specific Assessment & Plan notes found for this encounter.   No orders of the defined types were placed in this encounter.  All questions were answered. The patient knows to call the clinic with any problems, questions or concerns. No barriers to learning was detected. The total time spent in the appointment was 25 minutes.     YTruitt Merle MD 02/12/2020   I, AJoslyn Devon am acting as scribe for YTruitt Merle MD.   I have reviewed the above documentation for accuracy and completeness, and I agree with the above.

## 2020-02-12 NOTE — Patient Instructions (Signed)

## 2020-02-14 ENCOUNTER — Telehealth: Payer: Self-pay | Admitting: Hematology

## 2020-02-14 NOTE — Telephone Encounter (Signed)
No 10/4 los °

## 2020-02-16 ENCOUNTER — Telehealth: Payer: Self-pay

## 2020-02-16 NOTE — Telephone Encounter (Signed)
Mr Lamantia called [facility] line last pm and stated he has n/v every time he eats and his last bm was Monday.  I called this am to talk with him. The person who answered the phone stated he was at work.

## 2020-02-16 NOTE — Progress Notes (Signed)
[place]   [phone] Fax:([phone]) [phone]   Clinic Follow up Note   Patient Care Team: Wannetta Sender, FNP as PCP - General (Family Medicine) Truitt Merle, MD as Consulting Physician (Hematology) Alla Feeling, NP as Nurse Practitioner (Nurse Practitioner) Stark Klein, MD as Consulting Physician (General Surgery) Rogene Houston, MD as Consulting Physician (Gastroenterology) Milus Banister, MD as Attending Physician (Gastroenterology) Karie Mainland, RD as Dietitian (Nutrition)  Date of Service:  02/21/2020  CHIEF COMPLAINT: F/u on gastric cancer and IDA  SUMMARY OF ONCOLOGIC HISTORY: Oncology History Overview Note  Cancer Staging Malignant neoplasm of body of stomach ([place]) Staging form: Stomach, AJCC 8th Edition - Clinical stage from 03/11/2017: Stage IIB (cT3, cN0, cM0) - Unsigned - Pathologic stage from 04/22/2017: Stage IIIB (pT4a, pN3a, cM0) - Signed by Alla Feeling, NP on 05/17/2017     Malignant neoplasm of body of stomach ([place])  02/17/2017 Initial Diagnosis   Malignant neoplasm of body of stomach ([place])   02/17/2017 Pathology Results   Diagnosis Stomach, biopsy, gastric ulcer - ADENOCARCINOMA. Microscopic Comment Several of the fragments are involved by moderately differentiated adenocarcinoma.   02/17/2017 Procedure   UPPER ENDOSCOPY  FINDINGS - Normal esophagus. - Z-line irregular, 44 cm from the incisors. - Red blood in the gastric body and in the gastric antrum. - Large gastric ulcer. Biopsied. - Erythematous mucosa in the antrum. - Normal cardia, gastric fundus, gastric body and pylorus. - Normal duodenal bulb and second portion of the duodenum. Comment: Endoscopic appearence concerning for malignant ulcer.   03/11/2017 Procedure   UPPER EUS PER DR. Ardis Hughs Findings: 1. The esophagus was normal. 2. There was a 2-3cm ulcerated, malignant mass along the greater curvature of the stomach, approximately  mid-body. The mass was non-circumferential. 3. The duodenum was normal. Endosonographic Finding 1. The gastric mass above correlated with a hypoechoic and heterogenous non-circumferential mass that measured 2.9cm across, 27m deep. The endosonographic borders were poorly defined and there was clear sonographic evidence suggesting invasion into and through the muscularis propria layer without evident invasion into nearby organs (uT3). 2. The duodenal lymphnode described on recent CT scan appeared reactive by [place]. 3. No perigastric adenopathy (uN0) 4. Limited views of the liver, spleen, pancreas, bile duct, gallbladder were all normal. - Along the greater curvature of the stomach, approximately mid-body, there is a 2.9cm uT3N0 (clinical stage IIB) gastric adenocarcinoma.   04/06/2017 Imaging   CT CHEST IMPRESSION: 1. No acute cardiopulmonary abnormalities. 2. Two small pulmonary nodules are noted measuring up to 5 mm. Nonspecific but warrant attention on follow-up imaging follow up 3. Nonspecific hyperdense, possibly enhancing lesion along the dome of liver is identified. In a patient that is at increased risk for more definitive assessment of this structure with contrast enhanced MRI of the liver is advised.   04/21/2017 Imaging   MR ABDOMEN IMPRESSION: 1. Enhancing 2.9 cm mass along the lesser curvature in the gastric antrum, compatible with known gastric malignancy . 2. Mildly enlarged gastrohepatic ligament lymph node, cannot exclude nodal metastasis. 3. No definite liver metastatic disease. Hyperenhancing 0.9 cm liver dome mass remains inconclusive, although the MRI features are most suggestive of a flash filling hemangioma, and the mass has been stable for nearly 2 months. Additional subcentimeter focus of hyperenhancement in the lateral segment left liver lobe is most likely a benign transient vascular phenomenon. Recommend attention to these lesions on a follow-up MRI  abdomen without and with IV contrast in  3-6 months. This recommendation follows ACR consensus guidelines: Management of Incidental Liver Lesions on CT: A White Paper of the ACR Incidental Findings Committee. J Am Coll Radiol 2017; 27:0350-0938. 4. Benign right adrenal adenoma.     04/22/2017 Pathology Results   Diagnosis 1. Liver, biopsy - BILE DUCT HAMARTOMA. - THERE IS NO EVIDENCE OF MALIGNANCY. 2. Lymph nodes, regional resection, portal - METASTATIC CARCINOMA IN 2 OF 6 LYMPH NODES (2/6). 3. Stomach, resection for tumor, distal - INVASIVE ADENOCARCINOMA, POORLY DIFFERENTIATED, SPANNING 3.8 CM. - PERINEURAL INVASION IS IDENTIFIED. - ADENOCARCINOMA INVOLVES THE SEROSA. - METASTATIC CARCINOMA IN 12 OF 30 LYMPH NODES (12/30), WITH EXTRACAPSULAR EXTENSION. - SEE ONCOLOGY TABLE BELOW. 4. Lymph node, biopsy, common hepatic artery - THERE IS NO EVIDENCE OF CARCINOMA IN 1 OF 1 LYMPH NODE (0/1). Microscopic Comment 3. STOMACH: Specimen: Stomach. Procedure: Partial gastrectomy. Tumor Site: Greater curvature. Tumor Size: 3.8 cm Histologic Type: Adenocarcinoma. Histologic Grade: G3: poorly differentiated. Microscopic Extent of Tumor: Adenocarcinoma involves the serosa. Margins (select all that apply): Adenoca Proximal Margin: Negative for adenocarcinoma. Distal Margin: Negative for adenocarcinoma. Treatment Effect: N/A Lymph-Vascular Invasion: Not identified. Perineural Invasion: Present. Additional findings: Chronic gastritis. Ancillary testing: Can be performed upon clinician request. 1 of 3 Duplicate copy FINAL for [name], [name] ([id number]) Microscopic Comment(continued) Lymph nodes: number examined - 37; number positive: 14 Pathologic Staging: pT4a, pN3a (JBK:gt, 04/27/17)   06/02/2017 - 09/30/2017 Adjuvant Chemotherapy   FOLFOX every 2 weeks, oxaliplatin stopped in March 2019 due to neuropathy, chemo stopped on 09/30/2017 per pt's request    10/29/2017 Imaging    10/29/2017 CT CAP  IMPRESSION: 1. Status post distal gastrectomy. No evidence for metastatic disease in the chest, abdomen, or pelvis. 2. Stable tiny right pulmonary nodules. Continued attention on follow-up recommended. 3. 9 mm hypervascular lesion in the dome of the liver is stable over multiple studies back to 04/05/2017. Continued attention on follow-up suggested. 4. Ascending thoracic aorta measures 4.2 cm diameter. Recommend annual imaging followup by CTA or MRA. This recommendation follows 2010 ACCF/AHA/AATS/ACR/ASA/SCA/SCAI/SIR/STS/SVM Guidelines for the Diagnosis and Management of Patients with Thoracic Aortic Disease. Circulation. 2010; 121: R678-L381 . 5. Marked prostatomegaly. 6.  Aortic Atherosclerois (ICD10-170.0)   05/09/2018 Imaging   05/09/2018 CT CAP IMPRESSION: 1. No definite findings to suggest metastatic disease to the chest, abdomen or pelvis. 2. Multiple small pulmonary nodules scattered throughout the lungs bilaterally measuring 5 mm or less in size, stable compared to prior studies from 2018, favored to be benign. Continued attention on future follow-up examinations is recommended. 3. Aortic atherosclerosis with ectasia of the ascending thoracic aorta (4 cm in diameter). Recommend annual imaging followup by CTA or MRA. This recommendation follows 2010 ACCF/AHA/AATS/ACR/ASA/SCA/SCAI/SIR/STS/SVM Guidelines for the Diagnosis and Management of Patients with Thoracic Aortic Disease. Circulation. 2010; 121: O175-Z025. 4. Multiple small calculi lying dependently in the right-side of the urinary bladder. 5. Severe prostatomegaly. 6. Additional incidental findings, as above.   04/04/2019 Imaging   CT AP IMPRESSION: 1. Redemonstrated postoperative findings of distal gastrectomy and gastrojejunostomy (series 2, image 18, series 4, image 27). No evidence of malignant recurrence or metastatic disease in the abdomen or pelvis. 2. Subcutaneous body fat is  substantially diminished in comparison to prior examination, in keeping with reported history of weight loss. 3. Gross prostatomegaly and urinary bladder wall thickening, likely due to chronic outlet obstruction. 4.  Aortic Atherosclerosis (ICD10-I70.0).   08/16/2019 Imaging   CT CAP w contrast IMPRESSION: 1. No acute findings within the chest, abdomen or pelvis.  No specific findings identified to suggest residual or recurrent tumor or metastatic disease. 2. Small nonspecific pulmonary nodules are unchanged. 3. Marked enlargement of the prostate gland. 4. Multiple tiny bladder stones. 5. Aortic atherosclerosis. Aortic Atherosclerosis (ICD10-I70.0).   11/02/2019 Procedure   EGD impression - Normal hypopharynx. - Normal esophagus. - Z-line regular, 45 cm from the incisors. - Patent Billroth II gastrojejunostomy was found. 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 Biopsied. - Gastric ulcer with raised margins appears to be separate lesion. Biopsied.  comment: recurrent tumor at anastamosis appears to be separate form fundal lesion. these lesions are the source of chronic blood loss.  Colonoscopy impression: - Perianal skin tags found on perianal exam. - The entire examined colon is normal. - External and internal hemorrhoids. - No specimens collected.   11/02/2019 Pathology Results   FINAL MICROSCOPIC DIAGNOSIS:  A. STOMACH, BIOPSY:  -  Poorly differentiated carcinoma  -  See comment  B. STOMACH, FUNDUS, BIOPSY:  -  Adenocarcinoma  -  See comment  COMMENT:  A and B.  The carcinoma in part A is more poorly differentiated than  what is seen in part B but both are favored to be adenocarcinoma.  Dr.  Jeannie Done reviewed the case and agrees with the above diagnosis. Dr.  Laural Golden was notified of these results on November 03, 2019.    12/05/2019 PET scan   IMPRESSION: Marked hypermetabolic activity within the thickened stomach in this patient with known gastric cancer with associated  metastatic disease to the liver and small lymph nodes in the area of the hepatic gastric recess that are suspicious based on location and size though not particularly FDG avid.   Small lymph nodes along the posterolateral margin of the aorta with mild increased FDG uptake (image 107) (SUVmax = 2.8 these are approximately 6-7 mm and there are several small nodes tracking beneath the LEFT retrocrural region. Attention on follow-up)   Increased FDG uptake in the central portion of the prostate likely within the prosthetic urethra given the marked hypermetabolic features in the central location. Would however consider correlation with PSA as clinically warranted. Prostate is markedly enlarged otherwise with heterogeneous FDG uptake.   Aortic Atherosclerosis (ICD10-I70.0).    12/07/2019 -  Chemotherapy   FOLFOX every 2 weeks starting 12/07/19. Add Transtuzumab (Kanjinti) q2 weeks and Keytruda q6weeks with C3 on 01/24/20.    12/14/2019 Pathology Results   FINAL MICROSCOPIC DIAGNOSIS:   A. LIVER, NEEDLE CORE BIOPSY:  - Poorly differentiated adenocarcinoma, see comment.   COMMENT:   The tumor has a similar appearance to the patient's recent gastric  biopsies ([id number]). There is sufficient material for additional  testing if requested.       CURRENT THERAPY:  FOLFOX every 2 weeks starting 12/07/19. Add Transtuzumab (Kanjinti) q2 weeks and Keytruda q6weeks with C3 on 01/24/20.   INTERVAL HISTORY:  TREY GULBRANSON is here for a follow up. He notes he still has been taking 3 tablets of antiemetics which helps but limit. He has lost 5 pounds with latest cycle. He takes carnation breakfast once daily. He notes he uses Protonix and Megace. He notes he still works 3-4 days a week for 6-7 hours. He denies current abdominal pain. He notes he will occasionally drop food at work that is cold due to cold sensitivity.    REVIEW OF SYSTEMS:   Constitutional: Denies fevers, chills or abnormal  weight loss Eyes: Denies blurriness of vision Ears, nose, mouth, throat, and face: Denies mucositis or  sore throat Respiratory: Denies cough, dyspnea or wheezes Cardiovascular: Denies palpitation, chest discomfort or lower extremity swelling Gastrointestinal:  Denies heartburn or change in bowel habits (+) Nausea Skin: Denies abnormal skin rashes Lymphatics: Denies new lymphadenopathy or easy bruising Neurological: (+) cold sensitivity  Behavioral/Psych: Mood is stable, no new changes  All other systems were reviewed with the patient and are negative.  MEDICAL HISTORY:  Past Medical History:  Diagnosis Date  . Arthritis   . Cancer [facility]) dx oct 02-2017   stomach  . Gout   . Heart murmur   . Hyperlipidemia   . Hypertension   . Stomach cancer [facility])     SURGICAL HISTORY: Past Surgical History:  Procedure Laterality Date  . BIOPSY  11/02/2019   Procedure: BIOPSY;  Surgeon: Rogene Houston, MD;  [facility];  Service: Endoscopy;;  gastric  . COLONOSCOPY    . COLONOSCOPY N/A 11/02/2019   Procedure: COLONOSCOPY;  Surgeon: Rogene Houston, MD;  [facility];  Service: Endoscopy;  Laterality: N/A;  100  . ESOPHAGOGASTRODUODENOSCOPY N/A 02/17/2017   Procedure: ESOPHAGOGASTRODUODENOSCOPY (EGD);  Surgeon: Rogene Houston, MD;  [facility];  Service: Endoscopy;  Laterality: N/A;  3:00  . ESOPHAGOGASTRODUODENOSCOPY N/A 11/02/2019   Procedure: ESOPHAGOGASTRODUODENOSCOPY (EGD);  Surgeon: Rogene Houston, MD;  [facility];  Service: Endoscopy;  Laterality: N/A;  . EUS N/A 03/11/2017   Procedure: UPPER ENDOSCOPIC ULTRASOUND (EUS) LINEAR;  Surgeon: Milus Banister, MD;  [facility];  Service: Endoscopy;  Laterality: N/A;  . EUS N/A 03/11/2017   Procedure: UPPER ENDOSCOPIC ULTRASOUND (EUS) RADIAL;  Surgeon: Milus Banister, MD;  Location[facility];  Service: Endoscopy;  Laterality: N/A;  . FINGER SURGERY     Lt middle   .  GASTRECTOMY N/A 04/22/2017   Procedure: PARTIAL GASTRECTOMY;  Surgeon: Stark Klein, MD;  Location: [place];  Service: General;  Laterality: N/A;  . GASTROJEJUNOSTOMY N/A 04/22/2017   Procedure: JEJUNAL FEEDING TUBE PLACEMENT;  Surgeon: Stark Klein, MD;  Location: [place];  Service: General;  Laterality: N/A;  . HYDROCELE EXCISION  02/12/2011   Procedure: HYDROCELECTOMY ADULT;  Surgeon: Marissa Nestle;  Location: [facility];  Service: Urology;  Laterality: Left;  . LAPAROSCOPY N/A 04/22/2017   Procedure: LAPAROSCOPY DIAGNOSTIC ERAS PATHWAY;  Surgeon: Stark Klein, MD;  Location: [place];  Service: General;  Laterality: N/A;  EPIDURAL  . PORTACATH PLACEMENT N/A 05/28/2017   Procedure: INSERTION PORT-A-CATH ERAS PATHWAY;  Surgeon: Stark Klein, MD;  Location: [place];  Service: General;  Laterality: N/A;    I have reviewed the social history and family history with the patient and they are unchanged from previous note.  ALLERGIES:  is allergic to penicillins.  MEDICATIONS:  Current Outpatient Medications  Medication Sig Dispense Refill  . amLODipine (NORVASC) 10 MG tablet Take 1 tablet (10 mg total) by mouth daily. 90 tablet 0  . atorvastatin (LIPITOR) 40 MG tablet Take 1 tablet (40 mg total) by mouth daily at 6 PM. 90 tablet 0  . Cholecalciferol (VITAMIN D3) 5000 units CAPS Take 5,000 Units by mouth daily.    . finasteride (PROSCAR) 5 MG tablet Take 5 mg by mouth daily.    Marland Kitchen HYDROcodone-acetaminophen (NORCO/VICODIN) 5-325 MG tablet Take 1 tablet by mouth every 6 (six) hours as needed for moderate pain. 60 tablet 0  . lidocaine-prilocaine (EMLA) cream Apply to affected area once 30 g 3  . megestrol (MEGACE ES) 625 MG/5ML  suspension Take 5 mLs (625 mg total) by mouth daily. 150 mL 0  . ondansetron (ZOFRAN) 8 MG tablet Take 1 tablet (8 mg total) by mouth 2 (two) times daily as needed for refractory nausea / vomiting. Start on day 3 after chemotherapy. 30 tablet 1  . pantoprazole  (PROTONIX) 40 MG tablet Take 1 tablet (40 mg total) by mouth daily. 30 tablet 2  . potassium chloride SA (K-DUR,KLOR-CON) 20 MEQ tablet Take 1 tablet (20 mEq total) by mouth 2 (two) times daily. 40 tablet 1  . prochlorperazine (COMPAZINE) 10 MG tablet Take 1 tablet (10 mg total) by mouth every 6 (six) hours as needed (Nausea or vomiting). 30 tablet 1  . tadalafil (CIALIS) 5 MG tablet Take 5 mg by mouth daily.    . tamsulosin (FLOMAX) 0.4 MG CAPS capsule Take 0.4 mg by mouth daily.    Marland Kitchen triamterene-hydrochlorothiazide (MAXZIDE-25) 37.5-25 MG tablet Take 1 tablet by mouth daily.     No current facility-administered medications for this visit.   Facility-Administered Medications Ordered in Other Visits  Medication Dose Route Frequency Provider Last Rate Last Admin  . 0.9 %  sodium chloride infusion   Intravenous Continuous Truitt Merle, MD   Stopped at 02/21/20 1216  . fluorouracil (ADRUCIL) 4,150 mg in sodium chloride 0.9 % 67 mL chemo infusion  2,400 mg/m2 (Treatment Plan Recorded) Intravenous 1 day or 1 dose Truitt Merle, MD   4,150 mg at 02/21/20 1512    PHYSICAL EXAMINATION: ECOG PERFORMANCE STATUS: 2 - Symptomatic, <50% confined to bed  Vitals:   02/21/20 0941  BP: 139/80  Pulse: 63  Resp: 18  Temp: 97.8 F (36.6 C)  SpO2: 100%   Filed Weights   02/21/20 0941  Weight: 121 lb 4.8 oz (55 kg)    GENERAL:alert, no distress and comfortable SKIN: skin color, texture, turgor are normal, no rashes or significant lesions EYES: normal, Conjunctiva are pink and non-injected, sclera clear  NECK: supple, thyroid normal size, non-tender, without nodularity LYMPH:  no palpable lymphadenopathy in the cervical, axillary  LUNGS: clear to auscultation and percussion with normal breathing effort HEART: regular rate & rhythm and no murmurs and no lower extremity edema ABDOMEN:abdomen soft, non-tender and normal bowel sounds Musculoskeletal:no cyanosis of digits and no clubbing  NEURO: alert &  oriented x 3 with fluent speech (+) Moderate focal sensory deficits in hands  LABORATORY DATA:  I have reviewed the data as listed CBC Latest Ref Rng & Units 02/21/2020 02/07/2020 01/24/2020  WBC 4.0 - 10.5 K/uL 10.9(H) 5.7 8.4  Hemoglobin 13.0 - 17.0 g/dL 9.7(L) 9.8(L) 10.4(L)  Hematocrit 39 - 52 % 30.5(L) 30.7(L) 32.7(L)  Platelets 150 - 400 K/uL 242 252 244     CMP Latest Ref Rng & Units 02/21/2020 02/07/2020 01/24/2020  Glucose 70 - 99 mg/dL 79 78 79  BUN 8 - 23 mg/dL 24(H) 17 16  Creatinine 0.61 - 1.24 mg/dL 1.02 0.92 0.84  Sodium 135 - 145 mmol/L 138 142 140  Potassium 3.5 - 5.1 mmol/L 3.7 3.1(L) 3.5  Chloride 98 - 111 mmol/L 108 113(H) 109  CO2 22 - 32 mmol/L _0 Calcium 8.9 - 10.3 mg/dL 8.8(L) 8.7(L) 9.0  Total Protein 6.5 - 8.1 g/dL 5.2(L) 5.5(L) 6.0(L)  Total Bilirubin 0.3 - 1.2 mg/dL 0.5 0.4 0.5  Alkaline Phos 38 - 126 U/L 80 73 88  AST 15 - 41 U/L 12(L) 14(L) 14(L)  ALT 0 - 44 U/L 6 10 7  RADIOGRAPHIC STUDIES: I have personally reviewed the radiological images as listed and agreed with the findings in the report. No results found.   ASSESSMENT & PLAN:  Gabriel Poole is a 74 y.o. male with    1. Primary adenocarcinoma of the pyloric antrum, pT4a, PN3aM0, stage IIIB, Grade3, MSI-stable, recurrence at the anastomosis and newadenocarcinomain the gastric fundus in 10/2019, oligo liver metastasis 11/2019, HER2(+), MSS -Initially diagnosed in 02/2017.S/psurgical resection and adjuvant chemotherapyFOLFOX. Patient was not compliant with chemo, oxaliplatin stopped after 4 cycles, and 5-FU stopped after 8 cycles treatment per his request. -He unfortunately developed local gastric recurrence in July 2021 and liver metastasis in 12/2019 -His tumor is negative for PD-l1 and EBV, MMR normal, HER2(+), so he is a candidate for trastuzumab. -He started first linechemo FOLFOX q2weeks on 12/07/19.I addedTrastuzumab (Kanjinti)q2weeks and Keytruda q6weekswith C3 on  01/24/20.Goal of care is to control his disease and prolong his life. -S/p C4 he continues to have nausea, low appetite and mild weight loss. He has mostly cold sensitivity no worsened neuropathy. Labs reviewed and adequate to proceed with C5 FOLFOX and Kanjinti with dose reduced Oxaliplatin due to nausea.   -Continue Keytruda every 6 weeks.  -Plan to repeat scan end of 02/2020 -F/u next week for toxicity check with IV Fluids.    2. Abdominal pain, nausea and vomiting and weight loss  -Secondary to metastatic gastric cancer, and chemotherapy -His recent abdominal pain has currently resolved between C4. He still has hydrocodone if needed.  -For any constipation he can use miralax.  -Continue Zofran once in the morning and as needed, along with Compazine through the day. Overall he needs antiemetics at least 3 times a day to control his nausea.  -I encouraged him to increase his carnation breakfast to twice daily and continue Megace and Protonix once daily.  -Continue to f/u with Dietician   3. 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  -He has required blood transfusion on 08/03/19 and 10/25/19. He has received IV Feraheme as needed since 08/08/19. Frequency slowed down on chemotherapy.  -Currently mild and stable anemia on chemo.   4. Ascendingaortic aneurysm. Will monitor on future scans   5. Smokingcessation -Hesmokes less now, 1 cigarette a day occasionally.  6. BPH and elevated PSA -He has nocturia and urinary frequency, history of BPH, and elevated PSA in July 2019 -He was previously seen by urologist before and had prostate shaved.  -PriorCT scan findings showed severe prostatomegaly. PET from 12/05/19 showed marked hypermetabolic uptake in this area. I will repeat PSA, will hold on biopsy for now due  to his metastatic gastric cancer.   7. 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 This is currently manageable with adequate functioning.  -No progressive neuropathy so far on FOLFOX, but does have increased cold sensitivity. Will continue to monitor.    PLAN:  -Labs reviewed and adequate to proceed with C5 FOLFOX and Kanjinti with dose reduced Oxaliplatin to 60 mg/m due to poor tolerance -Lab, flush, f/u and IV Fluids next week for toxicity checkup -Lab, flush, f/u and FOLFOX and Kanjinti in 2, 4, and 7 weeks  -Keytruda in 2 weeks  -Restaging CT scan after cycle 6    No problem-specific Assessment & Plan notes found for this encounter.   Orders Placed  This Encounter  Procedures  . CT Abdomen Pelvis W Contrast    Standing Status:   Future    Standing Expiration Date:   02/20/2021    Order Specific Question:   If indicated for the ordered procedure, I authorize the administration of contrast media per Radiology protocol    Answer:   Yes    Order Specific Question:   Preferred imaging location?    Answer:   [facility]    Order Specific Question:   Release to patient    Answer:   Immediate    Order Specific Question:   Is Oral Contrast requested for this exam?    Answer:   Yes, Per Radiology protocol   All questions were answered. The patient knows to call the clinic with any problems, questions or concerns. No barriers to learning was detected. The total time spent in the appointment was 30 minutes.     Truitt Merle, MD 02/21/2020   I, Joslyn Devon, am acting as scribe for Truitt Merle, MD.   I have reviewed the above documentation for accuracy and completeness, and I agree with the above.

## 2020-02-21 ENCOUNTER — Inpatient Hospital Stay: Payer: Medicare HMO

## 2020-02-21 ENCOUNTER — Inpatient Hospital Stay (HOSPITAL_BASED_OUTPATIENT_CLINIC_OR_DEPARTMENT_OTHER): Payer: Medicare HMO | Admitting: Hematology

## 2020-02-21 ENCOUNTER — Other Ambulatory Visit: Payer: Self-pay

## 2020-02-21 ENCOUNTER — Encounter: Payer: Self-pay | Admitting: Hematology

## 2020-02-21 VITALS — BP 139/80 | HR 63 | Temp 97.8°F | Resp 18 | Ht 70.0 in | Wt 121.3 lb

## 2020-02-21 DIAGNOSIS — I10 Essential (primary) hypertension: Secondary | ICD-10-CM | POA: Diagnosis not present

## 2020-02-21 DIAGNOSIS — C162 Malignant neoplasm of body of stomach: Secondary | ICD-10-CM | POA: Diagnosis not present

## 2020-02-21 DIAGNOSIS — D649 Anemia, unspecified: Secondary | ICD-10-CM

## 2020-02-21 DIAGNOSIS — Z5111 Encounter for antineoplastic chemotherapy: Secondary | ICD-10-CM | POA: Diagnosis not present

## 2020-02-21 DIAGNOSIS — Z7189 Other specified counseling: Secondary | ICD-10-CM

## 2020-02-21 LAB — CBC WITH DIFFERENTIAL (CANCER CENTER ONLY)
Abs Immature Granulocytes: 0.04 10*3/uL (ref 0.00–0.07)
Basophils Absolute: 0 10*3/uL (ref 0.0–0.1)
Basophils Relative: 0 %
Eosinophils Absolute: 0 10*3/uL (ref 0.0–0.5)
Eosinophils Relative: 0 %
HCT: 30.5 % — ABNORMAL LOW (ref 39.0–52.0)
Hemoglobin: 9.7 g/dL — ABNORMAL LOW (ref 13.0–17.0)
Immature Granulocytes: 0 %
Lymphocytes Relative: 12 %
Lymphs Abs: 1.3 10*3/uL (ref 0.7–4.0)
MCH: 27.7 pg (ref 26.0–34.0)
MCHC: 31.8 g/dL (ref 30.0–36.0)
MCV: 87.1 fL (ref 80.0–100.0)
Monocytes Absolute: 0.9 10*3/uL (ref 0.1–1.0)
Monocytes Relative: 8 %
Neutro Abs: 8.7 10*3/uL — ABNORMAL HIGH (ref 1.7–7.7)
Neutrophils Relative %: 80 %
Platelet Count: 242 10*3/uL (ref 150–400)
RBC: 3.5 MIL/uL — ABNORMAL LOW (ref 4.22–5.81)
RDW: 14.2 % (ref 11.5–15.5)
WBC Count: 10.9 10*3/uL — ABNORMAL HIGH (ref 4.0–10.5)
nRBC: 0 % (ref 0.0–0.2)

## 2020-02-21 LAB — CMP (CANCER CENTER ONLY)
ALT: 6 U/L (ref 0–44)
AST: 12 U/L — ABNORMAL LOW (ref 15–41)
Albumin: 2.7 g/dL — ABNORMAL LOW (ref 3.5–5.0)
Alkaline Phosphatase: 80 U/L (ref 38–126)
Anion gap: 2 — ABNORMAL LOW (ref 5–15)
BUN: 24 mg/dL — ABNORMAL HIGH (ref 8–23)
CO2: 28 mmol/L (ref 22–32)
Calcium: 8.8 mg/dL — ABNORMAL LOW (ref 8.9–10.3)
Chloride: 108 mmol/L (ref 98–111)
Creatinine: 1.02 mg/dL (ref 0.61–1.24)
GFR, Estimated: >60 mL/min (ref >60)
Glucose, Bld: 79 mg/dL (ref 70–99)
Potassium: 3.7 mmol/L (ref 3.5–5.1)
Sodium: 138 mmol/L (ref 135–145)
Total Bilirubin: 0.5 mg/dL (ref 0.3–1.2)
Total Protein: 5.2 g/dL — ABNORMAL LOW (ref 6.5–8.1)

## 2020-02-21 LAB — IRON AND TIBC
Iron: 8 ug/dL — ABNORMAL LOW (ref 42–163)
Saturation Ratios: 4 % — ABNORMAL LOW (ref 20–55)
TIBC: 209 ug/dL (ref 202–409)
UIBC: 201 ug/dL (ref 117–376)

## 2020-02-21 LAB — FERRITIN: Ferritin: 121 ng/mL (ref 24–336)

## 2020-02-21 MED ORDER — DIPHENHYDRAMINE HCL 25 MG PO CAPS
ORAL_CAPSULE | ORAL | Status: AC
  Filled 2020-02-21: qty 1

## 2020-02-21 MED ORDER — PALONOSETRON HCL INJECTION 0.25 MG/5ML
0.2500 mg | Freq: Once | INTRAVENOUS | Status: AC
  Administered 2020-02-21: 0.25 mg via INTRAVENOUS

## 2020-02-21 MED ORDER — SODIUM CHLORIDE 0.9 % IV SOLN
INTRAVENOUS | Status: DC
  Filled 2020-02-21: qty 250

## 2020-02-21 MED ORDER — DIPHENHYDRAMINE HCL 25 MG PO CAPS
25.0000 mg | ORAL_CAPSULE | Freq: Once | ORAL | Status: AC
  Administered 2020-02-21: 25 mg via ORAL

## 2020-02-21 MED ORDER — LEUCOVORIN CALCIUM INJECTION 350 MG
400.0000 mg/m2 | Freq: Once | INTRAVENOUS | Status: AC
  Administered 2020-02-21: 692 mg via INTRAVENOUS
  Filled 2020-02-21: qty 34.6

## 2020-02-21 MED ORDER — PALONOSETRON HCL INJECTION 0.25 MG/5ML
INTRAVENOUS | Status: AC
  Filled 2020-02-21: qty 5

## 2020-02-21 MED ORDER — OXALIPLATIN CHEMO INJECTION 100 MG/20ML
59.0000 mg/m2 | Freq: Once | INTRAVENOUS | Status: AC
  Administered 2020-02-21: 100 mg via INTRAVENOUS
  Filled 2020-02-21: qty 20

## 2020-02-21 MED ORDER — DEXTROSE 5 % IV SOLN
Freq: Once | INTRAVENOUS | Status: AC
  Filled 2020-02-21: qty 250

## 2020-02-21 MED ORDER — ACETAMINOPHEN 325 MG PO TABS
ORAL_TABLET | ORAL | Status: AC
  Filled 2020-02-21: qty 2

## 2020-02-21 MED ORDER — SODIUM CHLORIDE 0.9 % IV SOLN
2400.0000 mg/m2 | INTRAVENOUS | Status: DC
  Administered 2020-02-21: 4150 mg via INTRAVENOUS
  Filled 2020-02-21: qty 83

## 2020-02-21 MED ORDER — SODIUM CHLORIDE 0.9 % IV SOLN
10.0000 mg | Freq: Once | INTRAVENOUS | Status: AC
  Administered 2020-02-21: 10 mg via INTRAVENOUS
  Filled 2020-02-21: qty 10

## 2020-02-21 MED ORDER — TRASTUZUMAB-ANNS CHEMO 150 MG IV SOLR
231.0000 mg | Freq: Once | INTRAVENOUS | Status: AC
  Administered 2020-02-21: 231 mg via INTRAVENOUS
  Filled 2020-02-21: qty 11

## 2020-02-21 MED ORDER — ACETAMINOPHEN 325 MG PO TABS
650.0000 mg | ORAL_TABLET | Freq: Once | ORAL | Status: AC
  Administered 2020-02-21: 650 mg via ORAL

## 2020-02-21 NOTE — Patient Instructions (Signed)
[facility] Discharge Instructions for Patients Receiving Chemotherapy  Today you received the following chemotherapy agents Trastuzumab-anns Calla Kicks), Oxaliplatin (ELOXATIN), Leucovorin & Flourouracil (ADRUCIL).  To help prevent nausea and vomiting after your treatment, we encourage you to take your nausea medication as prescribed.   If you develop nausea and vomiting that is not controlled by your nausea medication, call the clinic.   BELOW ARE SYMPTOMS THAT SHOULD BE REPORTED IMMEDIATELY:  *FEVER GREATER THAN 100.5 F  *CHILLS WITH OR WITHOUT FEVER  NAUSEA AND VOMITING THAT IS NOT CONTROLLED WITH YOUR NAUSEA MEDICATION  *UNUSUAL SHORTNESS OF BREATH  *UNUSUAL BRUISING OR BLEEDING  TENDERNESS IN MOUTH AND THROAT WITH OR WITHOUT PRESENCE OF ULCERS  *URINARY PROBLEMS  *BOWEL PROBLEMS  UNUSUAL RASH Items with * indicate a potential emergency and should be followed up as soon as possible.  Feel free to call the clinic should you have any questions or concerns. The clinic phone number is [phone].  Please show the [facility] at check-in to the Emergency Department and triage nurse.

## 2020-02-22 ENCOUNTER — Other Ambulatory Visit: Payer: Self-pay | Admitting: Hematology

## 2020-02-22 NOTE — Telephone Encounter (Signed)
Dr. Feng, For your approval or refusal. Crystelle Ferrufino M Darran Gabay, RN  

## 2020-02-23 ENCOUNTER — Telehealth: Payer: Self-pay | Admitting: Hematology

## 2020-02-23 ENCOUNTER — Inpatient Hospital Stay: Payer: Medicare HMO

## 2020-02-23 ENCOUNTER — Other Ambulatory Visit: Payer: Self-pay

## 2020-02-23 VITALS — BP 133/76 | HR 67 | Temp 98.7°F | Resp 18

## 2020-02-23 DIAGNOSIS — Z5111 Encounter for antineoplastic chemotherapy: Secondary | ICD-10-CM | POA: Diagnosis not present

## 2020-02-23 DIAGNOSIS — C162 Malignant neoplasm of body of stomach: Secondary | ICD-10-CM

## 2020-02-23 DIAGNOSIS — Z7189 Other specified counseling: Secondary | ICD-10-CM

## 2020-02-23 MED ORDER — HEPARIN SOD (PORK) LOCK FLUSH 100 UNIT/ML IV SOLN
500.0000 [IU] | Freq: Once | INTRAVENOUS | Status: AC | PRN
  Administered 2020-02-23: 500 [IU]
  Filled 2020-02-23: qty 5

## 2020-02-23 MED ORDER — SODIUM CHLORIDE 0.9% FLUSH
10.0000 mL | INTRAVENOUS | Status: DC | PRN
  Administered 2020-02-23: 10 mL
  Filled 2020-02-23: qty 10

## 2020-02-23 NOTE — Telephone Encounter (Signed)
Scheduled per 10/13 los. Spoke with pt's wife and is aware of appt times and dates added.

## 2020-02-26 ENCOUNTER — Ambulatory Visit: Payer: Medicare HMO

## 2020-02-26 ENCOUNTER — Inpatient Hospital Stay: Payer: Medicare HMO | Admitting: Hematology

## 2020-02-26 ENCOUNTER — Inpatient Hospital Stay: Payer: Medicare HMO

## 2020-02-26 DIAGNOSIS — C162 Malignant neoplasm of body of stomach: Secondary | ICD-10-CM

## 2020-02-27 ENCOUNTER — Other Ambulatory Visit: Payer: Medicare HMO

## 2020-02-27 ENCOUNTER — Telehealth: Payer: Self-pay

## 2020-02-27 NOTE — Telephone Encounter (Signed)
Attempted to reach Gabriel Poole to review appts for tomorrow.  He did not answer and I was unable to leave a message.  I will attempt again later.

## 2020-02-28 ENCOUNTER — Inpatient Hospital Stay: Payer: Medicare HMO

## 2020-02-28 ENCOUNTER — Other Ambulatory Visit: Payer: Medicare HMO

## 2020-03-01 NOTE — Progress Notes (Signed)
Gabriel Poole   [phone] [phone]   Clinic Follow up Note   Patient Care Team: Wannetta Sender, FNP as PCP - General (Family Medicine) Truitt Merle, MD as Consulting Physician (Hematology) Alla Feeling, NP as Nurse Practitioner (Nurse Practitioner) Stark Klein, MD as Consulting Physician (General Surgery) Rogene Houston, MD as Consulting Physician (Gastroenterology) Milus Banister, MD as Attending Physician (Gastroenterology) Karie Mainland, RD as Dietitian (Nutrition)  Date of Service:  03/06/2020  CHIEF COMPLAINT: F/u on gastric cancer and IDA  SUMMARY OF ONCOLOGIC HISTORY: Oncology History Overview Note  Cancer Staging Malignant neoplasm of body of stomach ([place]) Staging form: Stomach, AJCC 8th Edition - Clinical stage from 03/11/2017: Stage IIB (cT3, cN0, cM0) - Unsigned - Pathologic stage from 04/22/2017: Stage IIIB (pT4a, pN3a, cM0) - Signed by Alla Feeling, NP on 05/17/2017     Malignant neoplasm of body of stomach ([place])  02/17/2017 Initial Diagnosis   Malignant neoplasm of body of stomach ([place])   02/17/2017 Pathology Results   Diagnosis Stomach, biopsy, gastric ulcer - ADENOCARCINOMA. Microscopic Comment Several of the fragments are involved by moderately differentiated adenocarcinoma.   02/17/2017 Procedure   UPPER ENDOSCOPY  FINDINGS - Normal esophagus. - Z-line irregular, 44 cm from the incisors. - Red blood in the gastric body and in the gastric antrum. - Large gastric ulcer. Biopsied. - Erythematous mucosa in the antrum. - Normal cardia, gastric fundus, gastric body and pylorus. - Normal duodenal bulb and second portion of the duodenum. Comment: Endoscopic appearence concerning for malignant ulcer.   03/11/2017 Procedure   UPPER EUS PER DR. Ardis Hughs Findings: 1. The esophagus was normal. 2. There was a 2-3cm ulcerated, malignant mass along the greater curvature of the stomach, approximately  mid-body. The mass was non-circumferential. 3. The duodenum was normal. Endosonographic Finding 1. The gastric mass above correlated with a hypoechoic and heterogenous non-circumferential mass that measured 2.9cm across, 14m deep. The endosonographic borders were poorly defined and there was clear sonographic evidence suggesting invasion into and through the muscularis propria layer without evident invasion into nearby organs (uT3). 2. The duodenal lymphnode described on recent CT scan appeared reactive by [place]. 3. No perigastric adenopathy (uN0) 4. Limited views of the liver, spleen, pancreas, bile duct, gallbladder were all normal. - Along the greater curvature of the stomach, approximately mid-body, there is a 2.9cm uT3N0 (clinical stage IIB) gastric adenocarcinoma.   04/06/2017 Imaging   CT CHEST IMPRESSION: 1. No acute cardiopulmonary abnormalities. 2. Two small pulmonary nodules are noted measuring up to 5 mm. Nonspecific but warrant attention on follow-up imaging follow up 3. Nonspecific hyperdense, possibly enhancing lesion along the dome of liver is identified. In a patient that is at increased risk for more definitive assessment of this structure with contrast enhanced MRI of the liver is advised.   04/21/2017 Imaging   MR ABDOMEN IMPRESSION: 1. Enhancing 2.9 cm mass along the lesser curvature in the gastric antrum, compatible with known gastric malignancy . 2. Mildly enlarged gastrohepatic ligament lymph node, cannot exclude nodal metastasis. 3. No definite liver metastatic disease. Hyperenhancing 0.9 cm liver dome mass remains inconclusive, although the MRI features are most suggestive of a flash filling hemangioma, and the mass has been stable for nearly 2 months. Additional subcentimeter focus of hyperenhancement in the lateral segment left liver lobe is most likely a benign transient vascular phenomenon. Recommend attention to these lesions on a follow-up MRI  abdomen without and with IV contrast in  3-6 months. This recommendation follows ACR consensus guidelines: Management of Incidental Liver Lesions on CT: A White Paper of the ACR Incidental Findings Committee. J Am Coll Radiol 2017; 27:0350-0938. 4. Benign right adrenal adenoma.     04/22/2017 Pathology Results   Diagnosis 1. Liver, biopsy - BILE DUCT HAMARTOMA. - THERE IS NO EVIDENCE OF MALIGNANCY. 2. Lymph nodes, regional resection, portal - METASTATIC CARCINOMA IN 2 OF 6 LYMPH NODES (2/6). 3. Stomach, resection for tumor, distal - INVASIVE ADENOCARCINOMA, POORLY DIFFERENTIATED, SPANNING 3.8 CM. - PERINEURAL INVASION IS IDENTIFIED. - ADENOCARCINOMA INVOLVES THE SEROSA. - METASTATIC CARCINOMA IN 12 OF 30 LYMPH NODES (12/30), WITH EXTRACAPSULAR EXTENSION. - SEE ONCOLOGY TABLE BELOW. 4. Lymph node, biopsy, common hepatic artery - THERE IS NO EVIDENCE OF CARCINOMA IN 1 OF 1 LYMPH NODE (0/1). Microscopic Comment 3. STOMACH: Specimen: Stomach. Procedure: Partial gastrectomy. Tumor Site: Greater curvature. Tumor Size: 3.8 cm Histologic Type: Adenocarcinoma. Histologic Grade: G3: poorly differentiated. Microscopic Extent of Tumor: Adenocarcinoma involves the serosa. Margins (select all that apply): Adenoca Proximal Margin: Negative for adenocarcinoma. Distal Margin: Negative for adenocarcinoma. Treatment Effect: N/A Lymph-Vascular Invasion: Not identified. Perineural Invasion: Present. Additional findings: Chronic gastritis. Ancillary testing: Can be performed upon clinician request. 1 of 3 Duplicate copy FINAL for [name], [name] ([id number]) Microscopic Comment(continued) Lymph nodes: number examined - 37; number positive: 14 Pathologic Staging: pT4a, pN3a (JBK:gt, 04/27/17)   06/02/2017 - 09/30/2017 Adjuvant Chemotherapy   FOLFOX every 2 weeks, oxaliplatin stopped in March 2019 due to neuropathy, chemo stopped on 09/30/2017 per pt's request    10/29/2017 Imaging    10/29/2017 CT CAP  IMPRESSION: 1. Status post distal gastrectomy. No evidence for metastatic disease in the chest, abdomen, or pelvis. 2. Stable tiny right pulmonary nodules. Continued attention on follow-up recommended. 3. 9 mm hypervascular lesion in the dome of the liver is stable over multiple studies back to 04/05/2017. Continued attention on follow-up suggested. 4. Ascending thoracic aorta measures 4.2 cm diameter. Recommend annual imaging followup by CTA or MRA. This recommendation follows 2010 ACCF/AHA/AATS/ACR/ASA/SCA/SCAI/SIR/STS/SVM Guidelines for the Diagnosis and Management of Patients with Thoracic Aortic Disease. Circulation. 2010; 121: R678-L381 . 5. Marked prostatomegaly. 6.  Aortic Atherosclerois (ICD10-170.0)   05/09/2018 Imaging   05/09/2018 CT CAP IMPRESSION: 1. No definite findings to suggest metastatic disease to the chest, abdomen or pelvis. 2. Multiple small pulmonary nodules scattered throughout the lungs bilaterally measuring 5 mm or less in size, stable compared to prior studies from 2018, favored to be benign. Continued attention on future follow-up examinations is recommended. 3. Aortic atherosclerosis with ectasia of the ascending thoracic aorta (4 cm in diameter). Recommend annual imaging followup by CTA or MRA. This recommendation follows 2010 ACCF/AHA/AATS/ACR/ASA/SCA/SCAI/SIR/STS/SVM Guidelines for the Diagnosis and Management of Patients with Thoracic Aortic Disease. Circulation. 2010; 121: O175-Z025. 4. Multiple small calculi lying dependently in the right-side of the urinary bladder. 5. Severe prostatomegaly. 6. Additional incidental findings, as above.   04/04/2019 Imaging   CT AP IMPRESSION: 1. Redemonstrated postoperative findings of distal gastrectomy and gastrojejunostomy (series 2, image 18, series 4, image 27). No evidence of malignant recurrence or metastatic disease in the abdomen or pelvis. 2. Subcutaneous body fat is  substantially diminished in comparison to prior examination, in keeping with reported history of weight loss. 3. Gross prostatomegaly and urinary bladder wall thickening, likely due to chronic outlet obstruction. 4.  Aortic Atherosclerosis (ICD10-I70.0).   08/16/2019 Imaging   CT CAP w contrast IMPRESSION: 1. No acute findings within the chest, abdomen or pelvis.  No specific findings identified to suggest residual or recurrent tumor or metastatic disease. 2. Small nonspecific pulmonary nodules are unchanged. 3. Marked enlargement of the prostate gland. 4. Multiple tiny bladder stones. 5. Aortic atherosclerosis. Aortic Atherosclerosis (ICD10-I70.0).   11/02/2019 Procedure   EGD impression - Normal hypopharynx. - Normal esophagus. - Z-line regular, 45 cm from the incisors. - Patent Billroth II gastrojejunostomy was found. 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 Biopsied. - Gastric ulcer with raised margins appears to be separate lesion. Biopsied.  comment: recurrent tumor at anastamosis appears to be separate form fundal lesion. these lesions are the source of chronic blood loss.  Colonoscopy impression: - Perianal skin tags found on perianal exam. - The entire examined colon is normal. - External and internal hemorrhoids. - No specimens collected.   11/02/2019 Pathology Results   FINAL MICROSCOPIC DIAGNOSIS:  A. STOMACH, BIOPSY:  -  Poorly differentiated carcinoma  -  See comment  B. STOMACH, FUNDUS, BIOPSY:  -  Adenocarcinoma  -  See comment  COMMENT:  A and B.  The carcinoma in part A is more poorly differentiated than  what is seen in part B but both are favored to be adenocarcinoma.  Dr.  Jeannie Done reviewed the case and agrees with the above diagnosis. Dr.  Laural Golden was notified of these results on November 03, 2019.    12/05/2019 PET scan   IMPRESSION: Marked hypermetabolic activity within the thickened stomach in this patient with known gastric cancer with associated  metastatic disease to the liver and small lymph nodes in the area of the hepatic gastric recess that are suspicious based on location and size though not particularly FDG avid.   Small lymph nodes along the posterolateral margin of the aorta with mild increased FDG uptake (image 107) (SUVmax = 2.8 these are approximately 6-7 mm and there are several small nodes tracking beneath the LEFT retrocrural region. Attention on follow-up)   Increased FDG uptake in the central portion of the prostate likely within the prosthetic urethra given the marked hypermetabolic features in the central location. Would however consider correlation with PSA as clinically warranted. Prostate is markedly enlarged otherwise with heterogeneous FDG uptake.   Aortic Atherosclerosis (ICD10-I70.0).    12/07/2019 -  Chemotherapy   FOLFOX every 2 weeks starting 12/07/19. Add Transtuzumab (Kanjinti) q2 weeks and Keytruda q6weeks with C3 on 01/24/20.    12/14/2019 Pathology Results   FINAL MICROSCOPIC DIAGNOSIS:   A. LIVER, NEEDLE CORE BIOPSY:  - Poorly differentiated adenocarcinoma, see comment.   COMMENT:   The tumor has a similar appearance to the patient's recent gastric  biopsies ([id number]). There is sufficient material for additional  testing if requested.       CURRENT THERAPY:  FOLFOX every 2 weeks starting 12/07/19. Add Transtuzumab(Kanjinti) q2 weeks and Keytruda q6weeks with C3 on 01/24/20.  INTERVAL HISTORY:  DVANTE HANDS is here for a follow up. He presents to the clinic alone. He notes recently being more nauseous with vomiting. He notes he is out of zofran but still has compazine TID. He notes he has only taken Zofran once daily. He notes he still has abdominal pain 8/10. He notes numbness in his hands more which causes pain. He denies dropping things and denies neuropathy in his toes. He notes he feels tired all the time. He uses energy as needed. He denies overt bleeding or black stool.      REVIEW OF SYSTEMS:   Constitutional: Denies fevers, chills or abnormal weight loss (+)  Fatigue  Eyes: Denies blurriness of vision Ears, nose, mouth, throat, and face: Denies mucositis or sore throat Respiratory: Denies cough, dyspnea or wheezes Cardiovascular: Denies palpitation, chest discomfort or lower extremity swelling Gastrointestinal:  Denies heartburn or change in bowel habits (+) N&V Skin: Denies abnormal skin rashes Lymphatics: Denies new lymphadenopathy or easy bruising Neurological: (+) Numbness in hands  Behavioral/Psych: Mood is stable, no new changes  All other systems were reviewed with the patient and are negative.  MEDICAL HISTORY:  Past Medical History:  Diagnosis Date  . Arthritis   . Cancer [facility]) dx oct 02-2017   stomach  . Gout   . Heart murmur   . Hyperlipidemia   . Hypertension   . Stomach cancer [facility])     SURGICAL HISTORY: Past Surgical History:  Procedure Laterality Date  . BIOPSY  11/02/2019   Procedure: BIOPSY;  Surgeon: Rogene Houston, MD;  [facility];  Service: Endoscopy;;  gastric  . COLONOSCOPY    . COLONOSCOPY N/A 11/02/2019   Procedure: COLONOSCOPY;  Surgeon: Rogene Houston, MD;  [facility];  Service: Endoscopy;  Laterality: N/A;  100  . ESOPHAGOGASTRODUODENOSCOPY N/A 02/17/2017   Procedure: ESOPHAGOGASTRODUODENOSCOPY (EGD);  Surgeon: Rogene Houston, MD;  [facility];  Service: Endoscopy;  Laterality: N/A;  3:00  . ESOPHAGOGASTRODUODENOSCOPY N/A 11/02/2019   Procedure: ESOPHAGOGASTRODUODENOSCOPY (EGD);  Surgeon: Rogene Houston, MD;  [facility];  Service: Endoscopy;  Laterality: N/A;  . EUS N/A 03/11/2017   Procedure: UPPER ENDOSCOPIC ULTRASOUND (EUS) LINEAR;  Surgeon: Milus Banister, MD;  Location: [facility];  Service: Endoscopy;  Laterality: N/A;  . EUS N/A 03/11/2017   Procedure: UPPER ENDOSCOPIC ULTRASOUND (EUS) RADIAL;  Surgeon: Milus Banister, MD;  Location: [facility];  Service: Endoscopy;  Laterality: N/A;  . FINGER SURGERY     Lt middle   . GASTRECTOMY N/A 04/22/2017   Procedure: PARTIAL GASTRECTOMY;  Surgeon: Stark Klein, MD;  Location: [place];  Service: General;  Laterality: N/A;  . GASTROJEJUNOSTOMY N/A 04/22/2017   Procedure: JEJUNAL FEEDING TUBE PLACEMENT;  Surgeon: Stark Klein, MD;  Location: [place];  Service: General;  Laterality: N/A;  . HYDROCELE EXCISION  02/12/2011   Procedure: HYDROCELECTOMY ADULT;  Surgeon: Marissa Nestle;  Location: [facility];  Service: Urology;  Laterality: Left;  . LAPAROSCOPY N/A 04/22/2017   Procedure: LAPAROSCOPY DIAGNOSTIC ERAS PATHWAY;  Surgeon: Stark Klein, MD;  Location: [place];  Service: General;  Laterality: N/A;  EPIDURAL  . PORTACATH PLACEMENT N/A 05/28/2017   Procedure: INSERTION PORT-A-CATH ERAS PATHWAY;  Surgeon: Stark Klein, MD;  Location: [place];  Service: General;  Laterality: N/A;    I have reviewed the social history and family history with the patient and they are unchanged from previous note.  ALLERGIES:  is allergic to penicillins.  MEDICATIONS:  Current Outpatient Medications  Medication Sig Dispense Refill  . amLODipine (NORVASC) 10 MG tablet Take 1 tablet (10 mg total) by mouth daily. 90 tablet 0  . atorvastatin (LIPITOR) 40 MG tablet Take 1 tablet (40 mg total) by mouth daily at 6 PM. 90 tablet 0  . Cholecalciferol (VITAMIN D3) 5000 units CAPS Take 5,000 Units by mouth daily.    . finasteride (PROSCAR) 5 MG tablet Take 5 mg by mouth daily.    Marland Kitchen HYDROcodone-acetaminophen (NORCO/VICODIN) 5-325 MG tablet Take 1 tablet by mouth every 6 (six) hours as needed for moderate pain. 60 tablet 0  . lidocaine-prilocaine (  EMLA) cream Apply to affected area once 30 g 3  . megestrol (MEGACE ES) 625 MG/5ML suspension Take 5 mLs (625 mg total) by mouth daily. 150 mL 0  . ondansetron (ZOFRAN) 8 MG tablet Take 1 tablet (8 mg total) by mouth 2 (two) times daily as needed for  refractory nausea / vomiting. Start on day 3 after chemotherapy. 30 tablet 1  . pantoprazole (PROTONIX) 40 MG tablet Take 1 tablet (40 mg total) by mouth daily. 30 tablet 2  . potassium chloride SA (K-DUR,KLOR-CON) 20 MEQ tablet Take 1 tablet (20 mEq total) by mouth 2 (two) times daily. 40 tablet 1  . prochlorperazine (COMPAZINE) 10 MG tablet Take 1 tablet (10 mg total) by mouth every 6 (six) hours as needed (Nausea or vomiting). 30 tablet 1  . tadalafil (CIALIS) 5 MG tablet Take 5 mg by mouth daily.    . tamsulosin (FLOMAX) 0.4 MG CAPS capsule Take 0.4 mg by mouth daily.    Marland Kitchen triamterene-hydrochlorothiazide (MAXZIDE-25) 37.5-25 MG tablet Take 1 tablet by mouth daily.     No current facility-administered medications for this visit.    PHYSICAL EXAMINATION: ECOG PERFORMANCE STATUS: 2 - Symptomatic, <50% confined to bed  Vitals:   03/06/20 0940  BP: (!) 157/92  Pulse: 71  Resp: 18  Temp: 97.8 F (36.6 C)  SpO2: 100%   Filed Weights   03/06/20 0940  Weight: 121 lb 9.6 oz (55.2 kg)    GENERAL:alert, no distress and comfortable SKIN: skin color, texture, turgor are normal, no rashes or significant lesions EYES: normal, Conjunctiva are pink and non-injected, sclera clear  NECK: supple, thyroid normal size, non-tender, without nodularity LYMPH:  no palpable lymphadenopathy in the cervical, axillary  LUNGS: clear to auscultation and percussion with normal breathing effort HEART: regular rate & rhythm and no murmurs and no lower extremity edema ABDOMEN:abdomen soft, non-tender and normal bowel sounds Musculoskeletal:no cyanosis of digits and no clubbing  NEURO: alert & oriented x 3 with fluent speech, (+) decreased sensory deficits in hands  LABORATORY DATA:  I have reviewed the data as listed CBC Latest Ref Rng & Units 03/06/2020 02/21/2020 02/07/2020  WBC 4.0 - 10.5 K/uL 5.4 10.9(H) 5.7  Hemoglobin 13.0 - 17.0 g/dL 8.3(L) 9.7(L) 9.8(L)  Hematocrit 39 - 52 % 26.4(L) 30.5(L) 30.7(L)   Platelets 150 - 400 K/uL 317 242 252     CMP Latest Ref Rng & Units 03/06/2020 02/21/2020 02/07/2020  Glucose 70 - 99 mg/dL 82 79 78  BUN 8 - 23 mg/dL 14 24(H) 17  Creatinine 0.61 - 1.24 mg/dL 0.83 1.02 0.92  Sodium 135 - 145 mmol/L 137 138 142  Potassium 3.5 - 5.1 mmol/L 3.8 3.7 3.1(L)  Chloride 98 - 111 mmol/L 107 108 113(H)  CO2 22 - 32 mmol/L _0 Calcium 8.9 - 10.3 mg/dL 9.2 8.8(L) 8.7(L)  Total Protein 6.5 - 8.1 g/dL 5.9(L) 5.2(L) 5.5(L)  Total Bilirubin 0.3 - 1.2 mg/dL 0.4 0.5 0.4  Alkaline Phos 38 - 126 U/L 82 80 73  AST 15 - 41 U/L 13(L) 12(L) 14(L)  ALT 0 - 44 U/L _1 RADIOGRAPHIC STUDIES: I have personally reviewed the radiological images as listed and agreed with the findings in the report. No results found.   ASSESSMENT & PLAN:  HA PLACERES is a 74 y.o. male with   1.Primary adenocarcinoma of the pyloric antrum, pT4a, PN3aM0, stage IIIB, Grade3, MSI-stable, recurrence at the anastomosis and newadenocarcinomain  the gastric fundus in 10/2019, oligo liver metastasis 11/2019, HER2(+), MSS -Initially diagnosed in 02/2017.S/psurgical resection and adjuvant chemotherapyFOLFOX. Patient was not compliant with chemo, oxaliplatin stopped after 4 cycles, and 5-FU stopped after 8 cycles treatment per his request. -He unfortunately developed local gastric recurrence in July 2021 and liver metastasis in 12/2019 -His tumor is negative for PD-l1 and EBV, MMR normal, HER2(+), so he is a candidate for trastuzumab.Based on the [place] and recent FDA approval, he would also benefit from [facility]  -He started first linechemo FOLFOX q2weeks on 12/07/19.I addedTrastuzumab (Kanjinti)q2weeks and Keytruda q6weekswith C3 on 01/24/20.Goal of care is to control his disease and prolong his life. -S/p C5 he has mild neuropathy in his hands with decreased sensation.  -Labs reviewed and adequate to proceed with C6 FOLFOX and Kanjinti today with oxaliplatin dose  reduction -Continue Keytruda every 6 weeks. Proceed today (03/06/20)  -Plan to repeat scan in 2 weeks before next visit. F/u in 2 weeks    2.Abdominal pain, nausea and vomiting and weight loss  -Secondary to metastatic gastric cancer, and chemotherapy -His recent abdominal pain had resolved from C4-5 but recently recurred (8/10). He is taking hydrocodone. -For any constipation he can use miralax.  -I reviewed antiemetic use with him. Continue Zofran once in the morning and as needed,along with Compazine through the day. Overall he needs antiemetics at least 3 times a day to control his nausea.  -I encouraged him to increase his carnation breakfast to twice daily and continue Megace and Protonix once daily.  -Continue to f/u with Dietician   3. 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  -He has required blood transfusion on 08/03/19 and 10/25/19. He has received IV Feraheme as needed since 08/08/19. Frequency slowed down on chemotherapy.  -Has been mild and stable but today reduced to 8.3 (03/06/20). He denies obvious bleeding. If worsens, may give blood transfusion  4. Ascendingaortic aneurysm. Will monitor onfuture scans  5. Smokingcessation -Hesmokes less now, 1 cigarette a day occasionally.  6. BPH and elevated PSA -He has nocturia and urinary frequency, history of BPH, and elevated PSA in July 2019 -He was previously seen by urologist before and had prostate shaved.  -PriorCT scan findings showed severe prostatomegaly. PET from 12/05/19 showed marked hypermetabolic uptake in this area. I will repeat PSA, will hold on biopsy for now due to his metastatic gastric cancer.   7. Neuropathy, G1  -Secondary to initial FOLFOX -S/p C5 of restarted FOLFOX he has progressed neuropathy with numbness in hands.  No symptoms involved in his feet. I reduced Oxaliplatin dose further with C6.   PLAN:  -I refilled Norco, Zofran, Protonix today  -Labs reviewed and adequate to proceed with C6 FOLFOX and Kanjinti and Keytruda today with oxaliplatin dose reduction -we reviewed antiemetics and symptom management  -Lab, flush, f/u and FOLFOX and Kanjinti in 2 and 4 weeks.  -Continue Keytruda every 6 weeks.   -restaging CT CAP w contrast in 2 weeks before next visit   No problem-specific Assessment & Plan notes found for this encounter.   Orders Placed This Encounter  Procedures  . CT CHEST ABDOMEN PELVIS W CONTRAST    Standing Status:   Future    Standing Expiration Date:   03/06/2021    Order Specific Question:   If indicated for the ordered procedure, I authorize the  administration of contrast media per Radiology protocol    Answer:   Yes    Order Specific Question:   Preferred imaging [facility]?    [facility]    Order Specific Question:   Release to patient    Answer:   Immediate    Order Specific Question:   Is Oral Contrast requested for this exam?    Answer:   Yes, Per Radiology protocol    Order Specific Question:   Reason for Exam (SYMPTOM  OR DIAGNOSIS REQUIRED)    Answer:   Restaging  . Sample to Blood Bank    Standing Status:   Future    Standing Expiration Date:   03/06/2021   All questions were answered. The patient knows to call the clinic with any problems, questions or concerns. No barriers to learning was detected. The total time spent in the appointment was 30 minutes.      Truitt Merle, MD 03/06/2020   I, Joslyn Devon, am acting as scribe for Truitt Merle, MD.   I have reviewed the above documentation for accuracy and completeness, and I agree with the above.

## 2020-03-05 MED FILL — Dexamethasone Sodium Phosphate Inj 100 MG/10ML: INTRAMUSCULAR | Qty: 1 | Status: AC

## 2020-03-06 ENCOUNTER — Inpatient Hospital Stay: Payer: Medicare HMO

## 2020-03-06 ENCOUNTER — Other Ambulatory Visit (HOSPITAL_COMMUNITY): Payer: Self-pay | Admitting: Hematology

## 2020-03-06 ENCOUNTER — Inpatient Hospital Stay: Payer: Medicare HMO | Admitting: Nutrition

## 2020-03-06 ENCOUNTER — Other Ambulatory Visit: Payer: Self-pay

## 2020-03-06 ENCOUNTER — Inpatient Hospital Stay (HOSPITAL_BASED_OUTPATIENT_CLINIC_OR_DEPARTMENT_OTHER): Payer: Medicare HMO | Admitting: Hematology

## 2020-03-06 ENCOUNTER — Encounter: Payer: Self-pay | Admitting: Hematology

## 2020-03-06 VITALS — BP 157/92 | HR 71 | Temp 97.8°F | Resp 18 | Ht 70.0 in | Wt 121.6 lb

## 2020-03-06 DIAGNOSIS — Z5111 Encounter for antineoplastic chemotherapy: Secondary | ICD-10-CM | POA: Diagnosis not present

## 2020-03-06 DIAGNOSIS — C162 Malignant neoplasm of body of stomach: Secondary | ICD-10-CM | POA: Diagnosis not present

## 2020-03-06 DIAGNOSIS — Z7189 Other specified counseling: Secondary | ICD-10-CM

## 2020-03-06 DIAGNOSIS — Z95828 Presence of other vascular implants and grafts: Secondary | ICD-10-CM

## 2020-03-06 LAB — CBC WITH DIFFERENTIAL (CANCER CENTER ONLY)
Abs Immature Granulocytes: 0.02 10*3/uL (ref 0.00–0.07)
Basophils Absolute: 0 10*3/uL (ref 0.0–0.1)
Basophils Relative: 0 %
Eosinophils Absolute: 0 10*3/uL (ref 0.0–0.5)
Eosinophils Relative: 0 %
HCT: 26.4 % — ABNORMAL LOW (ref 39.0–52.0)
Hemoglobin: 8.3 g/dL — ABNORMAL LOW (ref 13.0–17.0)
Immature Granulocytes: 0 %
Lymphocytes Relative: 16 %
Lymphs Abs: 0.9 10*3/uL (ref 0.7–4.0)
MCH: 26.8 pg (ref 26.0–34.0)
MCHC: 31.4 g/dL (ref 30.0–36.0)
MCV: 85.2 fL (ref 80.0–100.0)
Monocytes Absolute: 0.5 10*3/uL (ref 0.1–1.0)
Monocytes Relative: 10 %
Neutro Abs: 4 10*3/uL (ref 1.7–7.7)
Neutrophils Relative %: 74 %
Platelet Count: 317 10*3/uL (ref 150–400)
RBC: 3.1 MIL/uL — ABNORMAL LOW (ref 4.22–5.81)
RDW: 14.9 % (ref 11.5–15.5)
WBC Count: 5.4 10*3/uL (ref 4.0–10.5)
nRBC: 0 % (ref 0.0–0.2)

## 2020-03-06 LAB — CMP (CANCER CENTER ONLY)
ALT: 8 U/L (ref 0–44)
AST: 13 U/L — ABNORMAL LOW (ref 15–41)
Albumin: 2.6 g/dL — ABNORMAL LOW (ref 3.5–5.0)
Alkaline Phosphatase: 82 U/L (ref 38–126)
Anion gap: 6 (ref 5–15)
BUN: 14 mg/dL (ref 8–23)
CO2: 24 mmol/L (ref 22–32)
Calcium: 9.2 mg/dL (ref 8.9–10.3)
Chloride: 107 mmol/L (ref 98–111)
Creatinine: 0.83 mg/dL (ref 0.61–1.24)
GFR, Estimated: >60 mL/min (ref >60)
Glucose, Bld: 82 mg/dL (ref 70–99)
Potassium: 3.8 mmol/L (ref 3.5–5.1)
Sodium: 137 mmol/L (ref 135–145)
Total Bilirubin: 0.4 mg/dL (ref 0.3–1.2)
Total Protein: 5.9 g/dL — ABNORMAL LOW (ref 6.5–8.1)

## 2020-03-06 LAB — TSH: TSH: 2.479 u[IU]/mL (ref 0.320–4.118)

## 2020-03-06 MED ORDER — SODIUM CHLORIDE 0.9% FLUSH
10.0000 mL | INTRAVENOUS | Status: DC | PRN
  Administered 2020-03-06: 10 mL via INTRAVENOUS
  Filled 2020-03-06: qty 10

## 2020-03-06 MED ORDER — SODIUM CHLORIDE 0.9 % IV SOLN: 4.0000 mg | Freq: Once | INTRAVENOUS | Status: DC

## 2020-03-06 MED ORDER — PALONOSETRON HCL INJECTION 0.25 MG/5ML
0.2500 mg | Freq: Once | INTRAVENOUS | Status: AC
  Administered 2020-03-06: 0.25 mg via INTRAVENOUS

## 2020-03-06 MED ORDER — SODIUM CHLORIDE 0.9 % IV SOLN
Freq: Once | INTRAVENOUS | Status: AC
  Filled 2020-03-06: qty 250

## 2020-03-06 MED ORDER — LEUCOVORIN CALCIUM INJECTION 350 MG
400.0000 mg/m2 | Freq: Once | INTRAVENOUS | Status: AC
  Administered 2020-03-06: 692 mg via INTRAVENOUS
  Filled 2020-03-06: qty 34.6

## 2020-03-06 MED ORDER — OXALIPLATIN CHEMO INJECTION 100 MG/20ML
50.0000 mg/m2 | Freq: Once | INTRAVENOUS | Status: AC
  Administered 2020-03-06: 85 mg via INTRAVENOUS
  Filled 2020-03-06: qty 17

## 2020-03-06 MED ORDER — ACETAMINOPHEN 325 MG PO TABS
ORAL_TABLET | ORAL | Status: AC
  Filled 2020-03-06: qty 2

## 2020-03-06 MED ORDER — DEXAMETHASONE SODIUM PHOSPHATE 10 MG/ML IJ SOLN
INTRAMUSCULAR | Status: AC
  Filled 2020-03-06: qty 1

## 2020-03-06 MED ORDER — HYDROCODONE-ACETAMINOPHEN 5-325 MG PO TABS: 1.0000 | ORAL_TABLET | Freq: Four times a day (QID) | ORAL | 0 refills | Status: DC | PRN

## 2020-03-06 MED ORDER — ACETAMINOPHEN 325 MG PO TABS
650.0000 mg | ORAL_TABLET | Freq: Once | ORAL | Status: AC
  Administered 2020-03-06: 650 mg via ORAL

## 2020-03-06 MED ORDER — SODIUM CHLORIDE 0.9 % IV SOLN
2400.0000 mg/m2 | INTRAVENOUS | Status: DC
  Administered 2020-03-06: 4150 mg via INTRAVENOUS
  Filled 2020-03-06: qty 83

## 2020-03-06 MED ORDER — DIPHENHYDRAMINE HCL 25 MG PO CAPS
ORAL_CAPSULE | ORAL | Status: AC
  Filled 2020-03-06: qty 1

## 2020-03-06 MED ORDER — PANTOPRAZOLE SODIUM 40 MG PO TBEC: 40.0000 mg | DELAYED_RELEASE_TABLET | Freq: Every day | ORAL | 2 refills | Status: DC

## 2020-03-06 MED ORDER — PALONOSETRON HCL INJECTION 0.25 MG/5ML
INTRAVENOUS | Status: AC
  Filled 2020-03-06: qty 5

## 2020-03-06 MED ORDER — TRASTUZUMAB-ANNS CHEMO 150 MG IV SOLR
231.0000 mg | Freq: Once | INTRAVENOUS | Status: AC
  Administered 2020-03-06: 231 mg via INTRAVENOUS
  Filled 2020-03-06: qty 11

## 2020-03-06 MED ORDER — SODIUM CHLORIDE 0.9 % IV SOLN
400.0000 mg | Freq: Once | INTRAVENOUS | Status: AC
  Administered 2020-03-06: 400 mg via INTRAVENOUS
  Filled 2020-03-06: qty 16

## 2020-03-06 MED ORDER — DEXAMETHASONE SODIUM PHOSPHATE 10 MG/ML IJ SOLN
4.0000 mg | Freq: Once | INTRAMUSCULAR | Status: AC
  Administered 2020-03-06: 4 mg via INTRAVENOUS

## 2020-03-06 MED ORDER — SODIUM CHLORIDE 0.9% FLUSH
10.0000 mL | INTRAVENOUS | Status: DC | PRN
  Filled 2020-03-06: qty 10

## 2020-03-06 MED ORDER — DIPHENHYDRAMINE HCL 25 MG PO CAPS
25.0000 mg | ORAL_CAPSULE | Freq: Once | ORAL | Status: AC
  Administered 2020-03-06: 25 mg via ORAL

## 2020-03-06 MED ORDER — ONDANSETRON HCL 8 MG PO TABS: 8.0000 mg | ORAL_TABLET | Freq: Two times a day (BID) | ORAL | 1 refills | Status: DC | PRN

## 2020-03-06 MED ORDER — DEXTROSE 5 % IV SOLN
Freq: Once | INTRAVENOUS | Status: AC
  Filled 2020-03-06: qty 250

## 2020-03-06 MED FILL — PANTOPRAZOLE SOD DR 40 MG T: 40 | 30 days supply | Qty: 30 | Fill #0

## 2020-03-06 MED FILL — ONDANSETRON HCL 8 MG TABLET: 8 | 15 days supply | Qty: 30 | Fill #0

## 2020-03-06 MED FILL — HYDROCODON-APAP 5-325: 5-325 | 15 days supply | Qty: 60 | Fill #0

## 2020-03-06 NOTE — Progress Notes (Signed)
Nutrition follow-up completed with patient during infusion for recurrent gastric cancer. Weight decreased and was documented as 121.6 pounds down from 126 pounds. Patient reports he is only been taking nausea medication once a day and was told by MD he should increase for better nausea control. Reports drinking 1 Carnation breakfast essentials daily. Denies questions or concerns.  Nutrition diagnosis: Inadequate oral intake continues.  Intervention: Continue small frequent meals and snacks with added calories and protein. Increase Carnation breakfast essentials twice a day. Provided support and encouragement.  Monitoring, evaluation, goals: Patient to increase calories and protein to minimize weight loss.  Next visit: Wednesday, December 1 during infusion.  **Disclaimer: This note was dictated with voice recognition software. Similar sounding words can inadvertently be transcribed and this note may contain transcription errors which may not have been corrected upon publication of note.**

## 2020-03-06 NOTE — Patient Instructions (Signed)
[facility] Discharge Instructions for Patients Receiving Chemotherapy  Today you received the following chemotherapy agents: Trastuzumab, Pembrolizumab, Oxaliplatin, leucovorin, 5FU  To help prevent nausea and vomiting after your treatment, we encourage you to take your nausea medication as directed.    If you develop nausea and vomiting that is not controlled by your nausea medication, call the clinic.   BELOW ARE SYMPTOMS THAT SHOULD BE REPORTED IMMEDIATELY:  *FEVER GREATER THAN 100.5 F  *CHILLS WITH OR WITHOUT FEVER  NAUSEA AND VOMITING THAT IS NOT CONTROLLED WITH YOUR NAUSEA MEDICATION  *UNUSUAL SHORTNESS OF BREATH  *UNUSUAL BRUISING OR BLEEDING  TENDERNESS IN MOUTH AND THROAT WITH OR WITHOUT PRESENCE OF ULCERS  *URINARY PROBLEMS  *BOWEL PROBLEMS  UNUSUAL RASH Items with * indicate a potential emergency and should be followed up as soon as possible.  Feel free to call the clinic should you have any questions or concerns. The clinic phone number is [phone].  Please show the [facility] at check-in to the Emergency Department and triage nurse.

## 2020-03-08 ENCOUNTER — Other Ambulatory Visit: Payer: Self-pay

## 2020-03-08 ENCOUNTER — Inpatient Hospital Stay: Payer: Medicare HMO

## 2020-03-08 ENCOUNTER — Telehealth: Payer: Self-pay | Admitting: Hematology

## 2020-03-08 VITALS — BP 125/70 | HR 74 | Temp 99.3°F | Resp 18

## 2020-03-08 DIAGNOSIS — Z7189 Other specified counseling: Secondary | ICD-10-CM

## 2020-03-08 DIAGNOSIS — C162 Malignant neoplasm of body of stomach: Secondary | ICD-10-CM

## 2020-03-08 DIAGNOSIS — Z5111 Encounter for antineoplastic chemotherapy: Secondary | ICD-10-CM | POA: Diagnosis not present

## 2020-03-08 MED ORDER — HEPARIN SOD (PORK) LOCK FLUSH 100 UNIT/ML IV SOLN
500.0000 [IU] | Freq: Once | INTRAVENOUS | Status: AC | PRN
  Administered 2020-03-08: 500 [IU]
  Filled 2020-03-08: qty 5

## 2020-03-08 MED ORDER — SODIUM CHLORIDE 0.9% FLUSH
10.0000 mL | INTRAVENOUS | Status: DC | PRN
  Administered 2020-03-08: 10 mL
  Filled 2020-03-08: qty 10

## 2020-03-08 NOTE — Patient Instructions (Signed)

## 2020-03-08 NOTE — Telephone Encounter (Signed)
Scheduled per 10/27 los. Pt will get a updated calendar from [facility].

## 2020-03-14 ENCOUNTER — Other Ambulatory Visit: Payer: Self-pay

## 2020-03-14 ENCOUNTER — Ambulatory Visit (HOSPITAL_COMMUNITY)
Admission: RE | Admit: 2020-03-14 | Discharge: 2020-03-14 | Disposition: A | Discharge status: Home / Self Care | Payer: Medicare HMO | Source: Ambulatory Visit | Attending: Hematology | Admitting: Hematology

## 2020-03-14 DIAGNOSIS — C162 Malignant neoplasm of body of stomach: Secondary | ICD-10-CM

## 2020-03-14 MED ORDER — IOHEXOL 300 MG/ML  SOLN
100.0000 mL | Freq: Once | INTRAMUSCULAR | Status: AC | PRN
  Administered 2020-03-14: 100 mL via INTRAVENOUS

## 2020-03-19 ENCOUNTER — Telehealth: Payer: Self-pay

## 2020-03-19 NOTE — Telephone Encounter (Signed)
Gabriel Poole left a vm yesterday late afternoon stating that he has a cold and "those pills didn't work".  I am cancelling my appts.  I returned his call today and left a vm for him to call

## 2020-03-20 ENCOUNTER — Inpatient Hospital Stay: Payer: Medicare HMO

## 2020-03-20 ENCOUNTER — Inpatient Hospital Stay: Payer: Medicare HMO | Attending: Hematology | Admitting: Hematology

## 2020-03-20 ENCOUNTER — Telehealth: Payer: Self-pay

## 2020-03-20 ENCOUNTER — Ambulatory Visit: Payer: Medicare HMO

## 2020-03-20 DIAGNOSIS — Z79899 Other long term (current) drug therapy: Secondary | ICD-10-CM | POA: Insufficient documentation

## 2020-03-20 DIAGNOSIS — I712 Thoracic aortic aneurysm, without rupture: Secondary | ICD-10-CM | POA: Insufficient documentation

## 2020-03-20 DIAGNOSIS — C163 Malignant neoplasm of pyloric antrum: Secondary | ICD-10-CM | POA: Insufficient documentation

## 2020-03-20 DIAGNOSIS — G629 Polyneuropathy, unspecified: Secondary | ICD-10-CM | POA: Insufficient documentation

## 2020-03-20 DIAGNOSIS — C162 Malignant neoplasm of body of stomach: Secondary | ICD-10-CM | POA: Insufficient documentation

## 2020-03-20 DIAGNOSIS — C787 Secondary malignant neoplasm of liver and intrahepatic bile duct: Secondary | ICD-10-CM | POA: Insufficient documentation

## 2020-03-20 DIAGNOSIS — R0989 Other specified symptoms and signs involving the circulatory and respiratory systems: Secondary | ICD-10-CM | POA: Insufficient documentation

## 2020-03-20 DIAGNOSIS — I10 Essential (primary) hypertension: Secondary | ICD-10-CM

## 2020-03-20 DIAGNOSIS — D509 Iron deficiency anemia, unspecified: Secondary | ICD-10-CM | POA: Insufficient documentation

## 2020-03-20 NOTE — Progress Notes (Signed)
[place]   [phone] Fax:(336) [phone]   Clinic Follow up Note   Patient Care Team: Wannetta Sender, FNP as PCP - General (Family Medicine) Truitt Merle, MD as Consulting Physician (Hematology) Alla Feeling, NP as Nurse Practitioner (Nurse Practitioner) Stark Klein, MD as Consulting Physician (General Surgery) Rogene Houston, MD as Consulting Physician (Gastroenterology) Milus Banister, MD as Attending Physician (Gastroenterology) Karie Mainland, RD as Dietitian (Nutrition)  Date of Service:  03/22/2020  CHIEF COMPLAINT: F/u on gastric cancer and IDA  SUMMARY OF ONCOLOGIC HISTORY: Oncology History Overview Note  Cancer Staging Malignant neoplasm of body of stomach ([place]) Staging form: Stomach, AJCC 8th Edition - Clinical stage from 03/11/2017: Stage IIB (cT3, cN0, cM0) - Unsigned - Pathologic stage from 04/22/2017: Stage IIIB (pT4a, pN3a, cM0) - Signed by Alla Feeling, NP on 05/17/2017     Malignant neoplasm of body of stomach ([place])  02/17/2017 Initial Diagnosis   Malignant neoplasm of body of stomach ([place])   02/17/2017 Pathology Results   Diagnosis Stomach, biopsy, gastric ulcer - ADENOCARCINOMA. Microscopic Comment Several of the fragments are involved by moderately differentiated adenocarcinoma.   02/17/2017 Procedure   UPPER ENDOSCOPY  FINDINGS - Normal esophagus. - Z-line irregular, 44 cm from the incisors. - Red blood in the gastric body and in the gastric antrum. - Large gastric ulcer. Biopsied. - Erythematous mucosa in the antrum. - Normal cardia, gastric fundus, gastric body and pylorus. - Normal duodenal bulb and second portion of the duodenum. Comment: Endoscopic appearence concerning for malignant ulcer.   03/11/2017 Procedure   UPPER EUS PER DR. Ardis Hughs Findings: 1. The esophagus was normal. 2. There was a 2-3cm ulcerated, malignant mass along the greater curvature of the stomach, approximately  mid-body. The mass was non-circumferential. 3. The duodenum was normal. Endosonographic Finding 1. The gastric mass above correlated with a hypoechoic and heterogenous non-circumferential mass that measured 2.9cm across, 44m deep. The endosonographic borders were poorly defined and there was clear sonographic evidence suggesting invasion into and through the muscularis propria layer without evident invasion into nearby organs (uT3). 2. The duodenal lymphnode described on recent CT scan appeared reactive by [place]. 3. No perigastric adenopathy (uN0) 4. Limited views of the liver, spleen, pancreas, bile duct, gallbladder were all normal. - Along the greater curvature of the stomach, approximately mid-body, there is a 2.9cm uT3N0 (clinical stage IIB) gastric adenocarcinoma.   04/06/2017 Imaging   CT CHEST IMPRESSION: 1. No acute cardiopulmonary abnormalities. 2. Two small pulmonary nodules are noted measuring up to 5 mm. Nonspecific but warrant attention on follow-up imaging follow up 3. Nonspecific hyperdense, possibly enhancing lesion along the dome of liver is identified. In a patient that is at increased risk for more definitive assessment of this structure with contrast enhanced MRI of the liver is advised.   04/21/2017 Imaging   MR ABDOMEN IMPRESSION: 1. Enhancing 2.9 cm mass along the lesser curvature in the gastric antrum, compatible with known gastric malignancy . 2. Mildly enlarged gastrohepatic ligament lymph node, cannot exclude nodal metastasis. 3. No definite liver metastatic disease. Hyperenhancing 0.9 cm liver dome mass remains inconclusive, although the MRI features are most suggestive of a flash filling hemangioma, and the mass has been stable for nearly 2 months. Additional subcentimeter focus of hyperenhancement in the lateral segment left liver lobe is most likely a benign transient vascular phenomenon. Recommend attention to these lesions on a follow-up MRI  abdomen without and with IV contrast in  3-6 months. This recommendation follows ACR consensus guidelines: Management of Incidental Liver Lesions on CT: A White Paper of the ACR Incidental Findings Committee. J Am Coll Radiol 2017; 75:6433-2951. 4. Benign right adrenal adenoma.     04/22/2017 Pathology Results   Diagnosis 1. Liver, biopsy - BILE DUCT HAMARTOMA. - THERE IS NO EVIDENCE OF MALIGNANCY. 2. Lymph nodes, regional resection, portal - METASTATIC CARCINOMA IN 2 OF 6 LYMPH NODES (2/6). 3. Stomach, resection for tumor, distal - INVASIVE ADENOCARCINOMA, POORLY DIFFERENTIATED, SPANNING 3.8 CM. - PERINEURAL INVASION IS IDENTIFIED. - ADENOCARCINOMA INVOLVES THE SEROSA. - METASTATIC CARCINOMA IN 12 OF 30 LYMPH NODES (12/30), WITH EXTRACAPSULAR EXTENSION. - SEE ONCOLOGY TABLE BELOW. 4. Lymph node, biopsy, common hepatic artery - THERE IS NO EVIDENCE OF CARCINOMA IN 1 OF 1 LYMPH NODE (0/1). Microscopic Comment 3. STOMACH: Specimen: Stomach. Procedure: Partial gastrectomy. Tumor Site: Greater curvature. Tumor Size: 3.8 cm Histologic Type: Adenocarcinoma. Histologic Grade: G3: poorly differentiated. Microscopic Extent of Tumor: Adenocarcinoma involves the serosa. Margins (select all that apply): Adenoca Proximal Margin: Negative for adenocarcinoma. Distal Margin: Negative for adenocarcinoma. Treatment Effect: N/A Lymph-Vascular Invasion: Not identified. Perineural Invasion: Present. Additional findings: Chronic gastritis. Ancillary testing: Can be performed upon clinician request. 1 of 3 Duplicate copy FINAL for HARLES, EVETTS ([id number]) Microscopic Comment(continued) Lymph nodes: number examined - 37; number positive: 14 Pathologic Staging: pT4a, pN3a (JBK:gt, 04/27/17)   06/02/2017 - 09/30/2017 Adjuvant Chemotherapy   FOLFOX every 2 weeks, oxaliplatin stopped in March 2019 due to neuropathy, chemo stopped on 09/30/2017 per pt's request    10/29/2017 Imaging    10/29/2017 CT CAP  IMPRESSION: 1. Status post distal gastrectomy. No evidence for metastatic disease in the chest, abdomen, or pelvis. 2. Stable tiny right pulmonary nodules. Continued attention on follow-up recommended. 3. 9 mm hypervascular lesion in the dome of the liver is stable over multiple studies back to 04/05/2017. Continued attention on follow-up suggested. 4. Ascending thoracic aorta measures 4.2 cm diameter. Recommend annual imaging followup by CTA or MRA. This recommendation follows 2010 ACCF/AHA/AATS/ACR/ASA/SCA/SCAI/SIR/STS/SVM Guidelines for the Diagnosis and Management of Patients with Thoracic Aortic Disease. Circulation. 2010; 121: K160-F093 . 5. Marked prostatomegaly. 6.  Aortic Atherosclerois (ICD10-170.0)   05/09/2018 Imaging   05/09/2018 CT CAP IMPRESSION: 1. No definite findings to suggest metastatic disease to the chest, abdomen or pelvis. 2. Multiple small pulmonary nodules scattered throughout the lungs bilaterally measuring 5 mm or less in size, stable compared to prior studies from 2018, favored to be benign. Continued attention on future follow-up examinations is recommended. 3. Aortic atherosclerosis with ectasia of the ascending thoracic aorta (4 cm in diameter). Recommend annual imaging followup by CTA or MRA. This recommendation follows 2010 ACCF/AHA/AATS/ACR/ASA/SCA/SCAI/SIR/STS/SVM Guidelines for the Diagnosis and Management of Patients with Thoracic Aortic Disease. Circulation. 2010; 121: A355-D322. 4. Multiple small calculi lying dependently in the right-side of the urinary bladder. 5. Severe prostatomegaly. 6. Additional incidental findings, as above.   04/04/2019 Imaging   CT AP IMPRESSION: 1. Redemonstrated postoperative findings of distal gastrectomy and gastrojejunostomy (series 2, image 18, series 4, image 27). No evidence of malignant recurrence or metastatic disease in the abdomen or pelvis. 2. Subcutaneous body fat is  substantially diminished in comparison to prior examination, in keeping with reported history of weight loss. 3. Gross prostatomegaly and urinary bladder wall thickening, likely due to chronic outlet obstruction. 4.  Aortic Atherosclerosis (ICD10-I70.0).   08/16/2019 Imaging   CT CAP w contrast IMPRESSION: 1. No acute findings within the chest, abdomen or pelvis.  No specific findings identified to suggest residual or recurrent tumor or metastatic disease. 2. Small nonspecific pulmonary nodules are unchanged. 3. Marked enlargement of the prostate gland. 4. Multiple tiny bladder stones. 5. Aortic atherosclerosis. Aortic Atherosclerosis (ICD10-I70.0).   11/02/2019 Procedure   EGD impression - Normal hypopharynx. - Normal esophagus. - Z-line regular, 45 cm from the incisors. - Patent Billroth II gastrojejunostomy was found. 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 Biopsied. - Gastric ulcer with raised margins appears to be separate lesion. Biopsied.  comment: recurrent tumor at anastamosis appears to be separate form fundal lesion. these lesions are the source of chronic blood loss.  Colonoscopy impression: - Perianal skin tags found on perianal exam. - The entire examined colon is normal. - External and internal hemorrhoids. - No specimens collected.   11/02/2019 Pathology Results   FINAL MICROSCOPIC DIAGNOSIS:  A. STOMACH, BIOPSY:  -  Poorly differentiated carcinoma  -  See comment  B. STOMACH, FUNDUS, BIOPSY:  -  Adenocarcinoma  -  See comment  COMMENT:  A and B.  The carcinoma in part A is more poorly differentiated than  what is seen in part B but both are favored to be adenocarcinoma.  Dr.  Jeannie Done reviewed the case and agrees with the above diagnosis. Dr.  Laural Golden was notified of these results on November 03, 2019.    12/05/2019 PET scan   IMPRESSION: Marked hypermetabolic activity within the thickened stomach in this patient with known gastric cancer with associated  metastatic disease to the liver and small lymph nodes in the area of the hepatic gastric recess that are suspicious based on location and size though not particularly FDG avid.   Small lymph nodes along the posterolateral margin of the aorta with mild increased FDG uptake (image 107) (SUVmax = 2.8 these are approximately 6-7 mm and there are several small nodes tracking beneath the LEFT retrocrural region. Attention on follow-up)   Increased FDG uptake in the central portion of the prostate likely within the prosthetic urethra given the marked hypermetabolic features in the central location. Would however consider correlation with PSA as clinically warranted. Prostate is markedly enlarged otherwise with heterogeneous FDG uptake.   Aortic Atherosclerosis (ICD10-I70.0).    12/07/2019 -  Chemotherapy   FOLFOX every 2 weeks starting 12/07/19. Add Transtuzumab (Kanjinti) q2 weeks and Keytruda q6weeks with C3 on 01/24/20.    12/14/2019 Pathology Results   FINAL MICROSCOPIC DIAGNOSIS:   A. LIVER, NEEDLE CORE BIOPSY:  - Poorly differentiated adenocarcinoma, see comment.   COMMENT:   The tumor has a similar appearance to the patient's recent gastric  biopsies ([id number]). There is sufficient material for additional  testing if requested.    03/14/2020 Imaging   CT CAP IMPRESSION: 1. The small hypodense lesion in segment 4 of the liver was previously hypermetabolic, and is subjectively reduced in size compared to prior PET-CT. 2. Dilated loops of small bowel anteriorly without a well-defined lead point for obstruction. This could be from ileus or low-grade partial small bowel obstruction. 3. Continued gastric wall thickening is not entirely specific but residual gastric tumor is not excluded. Postoperative findings along the residual stomach. 4. Cannot exclude wall thickening in the nondistended ascending colon and hepatic flexure. 5. Diffuse low-grade subcutaneous and mesenteric  edema. Trace ascites in the pelvis. Appearance favors third spacing of fluid. 6. Bilateral airway thickening with airway plugging in both lower lobes and resulting airspace opacities due to atelectasis and possibly pneumonia in the lower lobes. 7. Marked prostatomegaly.  8. Stable 4.2 cm ascending thoracic aortic aneurysm. 9. Aortic atherosclerosis.      CURRENT THERAPY:  FOLFOX every 2 weeks starting 12/07/19. Add Transtuzumab(Kanjinti) q2 weeks and Keytruda q6weeks with C3 on 01/24/20.Held since 03/22/20 due to chest congestion and bowel inflammation.   INTERVAL HISTORY:  ERCIL CASSIS is here for a follow up. He notes earlier this week he missed appointment due to feeling sick since last week. He notes chest congestion with highest Temp 76F. He took OTC Tylenol. He also notes cold chills. He did not go to work for 3-4 days in the past week. He notes he is feeling mildly better.  He notes stomach pain and he has vomiting every time he eats food. His ex wife notes he is barely lucid and has poor balance when she sees him. He notes he took 7 laxative last week in order for him to have 1 BM. That was his only BM last week.    REVIEW OF SYSTEMS:   Constitutional: Denies fevers, chills or abnormal weight loss (+) Low food intake  Eyes: Denies blurriness of vision Ears, nose, mouth, throat, and face: Denies mucositis or sore throat Respiratory: Denies cough, dyspnea or wheezes (+) chest congestion  Cardiovascular: Denies palpitation, chest discomfort or lower extremity swelling Gastrointestinal:  Denies heartburn (+) constipation (+) N&V Skin: Denies abnormal skin rashes Lymphatics: Denies new lymphadenopathy or easy bruising Neurological:Denies numbness, tingling or new weaknesses Behavioral/Psych: Mood is stable, no new changes  All other systems were reviewed with the patient and are negative.  MEDICAL HISTORY:  Past Medical History:  Diagnosis Date   Arthritis    Cancer  [facility]) dx oct 02-2017   stomach   Gout    Heart murmur    Hyperlipidemia    Hypertension    Stomach cancer [facility])     SURGICAL HISTORY: Past Surgical History:  Procedure Laterality Date   BIOPSY  11/02/2019   Procedure: BIOPSY;  Surgeon: Rogene Houston, MD;  [facility];  Service: Endoscopy;;  gastric   COLONOSCOPY     COLONOSCOPY N/A 11/02/2019   Procedure: COLONOSCOPY;  Surgeon: Rogene Houston, MD;  [facility];  Service: Endoscopy;  Laterality: N/A;  100   ESOPHAGOGASTRODUODENOSCOPY N/A 02/17/2017   Procedure: ESOPHAGOGASTRODUODENOSCOPY (EGD);  Surgeon: Rogene Houston, MD;  [facility];  Service: Endoscopy;  Laterality: N/A;  3:00   ESOPHAGOGASTRODUODENOSCOPY N/A 11/02/2019   Procedure: ESOPHAGOGASTRODUODENOSCOPY (EGD);  Surgeon: Rogene Houston, MD;  [facility];  Service: Endoscopy;  Laterality: N/A;   EUS N/A 03/11/2017   Procedure: UPPER ENDOSCOPIC ULTRASOUND (EUS) LINEAR;  Surgeon: Milus Banister, MD;  [facility];  Service: Endoscopy;  Laterality: N/A;   EUS N/A 03/11/2017   Procedure: UPPER ENDOSCOPIC ULTRASOUND (EUS) RADIAL;  Surgeon: Milus Banister, MD;  [facility];  Service: Endoscopy;  Laterality: N/A;   FINGER SURGERY     Lt middle    GASTRECTOMY N/A 04/22/2017   Procedure: PARTIAL GASTRECTOMY;  Surgeon: Stark Klein, MD;  Location: [place];  Service: General;  Laterality: N/A;   GASTROJEJUNOSTOMY N/A 04/22/2017   Procedure: JEJUNAL FEEDING TUBE PLACEMENT;  Surgeon: Stark Klein, MD;  Location: [place];  Service: General;  Laterality: N/A;   HYDROCELE EXCISION  02/12/2011   Procedure: HYDROCELECTOMY ADULT;  Surgeon: Marissa Nestle;  Location: [facility];  Service: Urology;  Laterality: Left;   LAPAROSCOPY N/A 04/22/2017   Procedure: LAPAROSCOPY DIAGNOSTIC ERAS PATHWAY;  Surgeon:  Stark Klein, MD;  Location: [place];  Service: General;  Laterality: N/A;  EPIDURAL   PORTACATH  PLACEMENT N/A 05/28/2017   Procedure: Grandfather;  Surgeon: Stark Klein, MD;  Location: [place];  Service: General;  Laterality: N/A;    I have reviewed the social history and family history with the patient and they are unchanged from previous note.  ALLERGIES:  is allergic to penicillins.  MEDICATIONS:  Current Outpatient Medications  Medication Sig Dispense Refill   amLODipine (NORVASC) 10 MG tablet Take 1 tablet (10 mg total) by mouth daily. 90 tablet 0   atorvastatin (LIPITOR) 40 MG tablet Take 1 tablet (40 mg total) by mouth daily at 6 PM. 90 tablet 0   Cholecalciferol (VITAMIN D3) 5000 units CAPS Take 5,000 Units by mouth daily.     finasteride (PROSCAR) 5 MG tablet Take 5 mg by mouth daily.     HYDROcodone-acetaminophen (NORCO/VICODIN) 5-325 MG tablet TAKE 1 TABLET EVERY 6 HOURS AS NEEDED FOR MODERATE PAIN 60 tablet 0   HYDROcodone-acetaminophen (NORCO/VICODIN) 5-325 MG tablet Take 1 tablet by mouth every 6 (six) hours as needed for moderate pain. 60 tablet 0   lidocaine-prilocaine (EMLA) cream Apply to affected area once 30 g 3   megestrol (MEGACE ES) 625 MG/5ML suspension Take 5 mLs (625 mg total) by mouth daily. 150 mL 0   ondansetron (ZOFRAN) 8 MG tablet Take 1 tablet (8 mg total) by mouth 2 (two) times daily as needed for refractory nausea / vomiting. Start on day 3 after chemotherapy. 30 tablet 1   pantoprazole (PROTONIX) 40 MG tablet Take 1 tablet (40 mg total) by mouth daily. 30 tablet 2   potassium chloride SA (K-DUR,KLOR-CON) 20 MEQ tablet Take 1 tablet (20 mEq total) by mouth 2 (two) times daily. 40 tablet 1   prochlorperazine (COMPAZINE) 10 MG tablet Take 1 tablet (10 mg total) by mouth every 6 (six) hours as needed (Nausea or vomiting). 30 tablet 1   tadalafil (CIALIS) 5 MG tablet Take 5 mg by mouth daily.     tamsulosin (FLOMAX) 0.4 MG CAPS capsule Take 0.4 mg by mouth daily.     triamterene-hydrochlorothiazide  (MAXZIDE-25) 37.5-25 MG tablet Take 1 tablet by mouth daily.     No current facility-administered medications for this visit.    PHYSICAL EXAMINATION: ECOG PERFORMANCE STATUS: 2 - Symptomatic, <50% confined to bed  Vitals:   03/22/20 1244  BP: 122/80  Pulse: 68  Resp: 20  Temp: 97.8 F (36.6 C)  SpO2: 99%   Filed Weights   03/22/20 1244  Weight: 118 lb 14.4 oz (53.9 kg)    GENERAL:alert, no distress and comfortable SKIN: skin color, texture, turgor are normal, no rashes or significant lesions EYES: normal, Conjunctiva are pink and non-injected, sclera clear  NECK: supple, thyroid normal size, non-tender, without nodularity LYMPH:  no palpable lymphadenopathy in the cervical, axillary  LUNGS: clear to auscultation and percussion with normal breathing effort HEART: regular rate & rhythm and no murmurs and no lower extremity edema ABDOMEN: (+) Low bowel sounds (+) Rebound tenderness Musculoskeletal:no cyanosis of digits and no clubbing  NEURO: alert & oriented x 3 with fluent speech, no focal motor/sensory deficits  LABORATORY DATA:  I have reviewed the data as listed CBC Latest Ref Rng & Units 03/22/2020 03/06/2020 02/21/2020  WBC 4.0 - 10.5 K/uL 6.7 5.4 10.9(H)  Hemoglobin 13.0 - 17.0 g/dL 9.6(L) 8.3(L) 9.7(L)  Hematocrit 39 - 52 % 30.3(L) 26.4(L) 30.5(L)  Platelets 150 - 400 K/uL 307 317 242     CMP Latest Ref Rng & Units 03/22/2020 03/06/2020 02/21/2020  Glucose 70 - 99 mg/dL 80 82 79  BUN 8 - 23 mg/dL 27(H) 14 24(H)  Creatinine 0.61 - 1.24 mg/dL 2.20(H) 0.83 1.02  Sodium 135 - 145 mmol/L 136 137 138  Potassium 3.5 - 5.1 mmol/L 4.4 3.8 3.7  Chloride 98 - 111 mmol/L 103 107 108  CO2 22 - 32 mmol/L _0 Calcium 8.9 - 10.3 mg/dL 9.1 9.2 8.8(L)  Total Protein 6.5 - 8.1 g/dL 5.6(L) 5.9(L) 5.2(L)  Total Bilirubin 0.3 - 1.2 mg/dL 0.9 0.4 0.5  Alkaline Phos 38 - 126 U/L 85 82 80  AST 15 - 41 U/L 14(L) 13(L) 12(L)  ALT 0 - 44 U/L <_1 RADIOGRAPHIC  STUDIES: I have personally reviewed the radiological images as listed and agreed with the findings in the report. No results found.   ASSESSMENT & PLAN:  Gabriel Poole is a 74 y.o. male with    1.Primary adenocarcinoma of the pyloric antrum, pT4a, PN3aM0, stage IIIB, Grade3, MSI-stable, recurrence at the anastomosis and newadenocarcinomain the gastric fundus in 10/2019, oligo liver metastasis 11/2019, HER2(+), MSS -Initially diagnosed in 02/2017.S/psurgical resection and adjuvant chemotherapyFOLFOX. Patient was not compliant with chemo, oxaliplatin stopped after 4 cycles, and 5-FU stopped after 8 cycles treatment per his request. -He unfortunately developed local gastric recurrence in July 2021 and liver metastasis in 12/2019 -His tumor is negative for PD-l1 and EBV, MMR normal, HER2(+), so he is a candidate for trastuzumab.Based on the [place] and recent FDA approval, he would also benefit from [facility]  -He started first linechemo FOLFOX q2weeks on 12/07/19.I addedTrastuzumab (Kanjinti)q2weeks and Keytruda q6weekswith C3 on 01/24/20.Goal of care is to control his disease and prolong his life. -I personally reviewed and discussed his CT CAP from 03/14/20 which shows liver lesion have decreased in size. Scan also shows dilated loops of small bowel anteriorly without obstruction concerning for illeus. Overall he has some response to treatment.  -For the past week he has had chest congestion, temp at 46F, low food intake and increased fatigue. On exam today there is diffuse right side and mid abdominal tenderness, will hold chemo for now -F/u in 1 week    2.Abdominal pain, N&V, weight loss, Constipation, Acute bowel inflammation.  -Secondary to metastatic gastric cancer, and chemotherapy -For abdominal pain, he is taking hydrocodone. -He notes 1 BM last week. He can only have BM with laxative use. I recommend he use Miralax or Colase. -He continues to have N&V  postprandial. I recommend bland diet. I also encouraged him to use carnation breakfast, boost or ensure which has more calorie. He also has Megace and Protonix.  -Continue Zofran once in the morning and as needed,along with Compazine through the day. -Continue to f/u with Dietician  -On exam today (03/22/20) he has low bowel sounds and tenderness, no rebound tenderness. Etiology is unclear, but likely related to chemo or immunotherapy. This inflammation is contributing to his constipation and N&V. I will start him on oral steroid bedesonide 71m daily for 2 weeks to reduce inflammation in his bowel. I will discuss with his GI Dr. RLaural Goldenalso   3. 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  -He has required blood  transfusion on 08/03/19 and 10/25/19. He has received IV Feraheme as needed since 08/08/19. Frequency slowed down on chemotherapy.  Hg 9.6 today (03/22/20). Iron panel still pending.   4. Ascendingaortic aneurysm. Will monitor onfuture scans  5. Smokingcessation -Hesmokes less now, 1 cigarette a day occasionally.  6. BPH and elevated PSA -He has nocturia and urinary frequency, history of BPH, and elevated PSA in July 2019 -He was previously seen by urologist before and had prostate shaved.  -PriorCT scan findings showed severe prostatomegaly. PET from 12/05/19 showed marked hypermetabolic uptake in this area. I will repeat PSA, will hold on biopsy for now due to his metastatic gastric cancer.    7. Neuropathy, G1  -Secondary to initial FOLFOX -S/p C5 of restarted FOLFOX he has progressed neuropathy with numbness in hands. No symptoms involved in his feet. I reduced Oxaliplatin dose further with C6.   8. Chest congestion  -For the past week he has had chest congestion, temp at 31F, low food intake and  increased fatigue. -He has had both COVID vaccine series.  -will call in levaquin 782m daily for 7 days    PLAN:  -Hold chemo today  -NS 1L with zofran and dexa 87meach today -I called in budesonide [id number] for 14 days and levaquin 750m387mily for 7 days -IVF twice next week -f/u in one week -will discuss with Dr. RehmLaural GoldenNo problem-specific Assessment & Plan notes found for this encounter.   No orders of the defined types were placed in this encounter.  All questions were answered. The patient knows to call the clinic with any problems, questions or concerns. No barriers to learning was detected. The total time spent in the appointment was 30 minutes.     Pamlea Finder Truitt Merle 03/22/2020   I, AmoyJoslyn Devon acting as scribe for Akayla Brass Truitt Merle.   I have reviewed the above documentation for accuracy and completeness, and I agree with the above.

## 2020-03-20 NOTE — Telephone Encounter (Signed)
I called Mr Munos's home phone number and his girlfriend Langley Gauss answered.  She said he is at work.  I told her I was trying to reach him as he called and said he had a cold and the medicine didn't work.  She said he is still having trouble keeping food down.  I told her he could have been evaluated today at his appt.  She said she tried to get him to come but he is frustrated.  She said he wanted his scan results.  She made him an appt for Friday for lab and f/uwith Dr. Burr Medico.

## 2020-03-21 ENCOUNTER — Telehealth: Payer: Self-pay | Admitting: *Deleted

## 2020-03-21 NOTE — Telephone Encounter (Signed)
Pt left vm this am stating he was sick with the flu & needed to cancel appt for tomorrow.  Returned call & he states he has slight fever, yellow secretions & is taking theraflu. He also reports continued N/V & losing weight.  He states symptoms started Monday & highest fever has been 99. He plans to go to work today.  Encouraged to keep appt tomorrow if feeling better.  He says he will call in am.

## 2020-03-22 ENCOUNTER — Inpatient Hospital Stay: Payer: Medicare HMO

## 2020-03-22 ENCOUNTER — Encounter: Payer: Self-pay | Admitting: Hematology

## 2020-03-22 ENCOUNTER — Inpatient Hospital Stay (HOSPITAL_BASED_OUTPATIENT_CLINIC_OR_DEPARTMENT_OTHER): Payer: Medicare HMO | Admitting: Hematology

## 2020-03-22 ENCOUNTER — Other Ambulatory Visit: Payer: Self-pay

## 2020-03-22 VITALS — BP 122/80 | HR 68 | Temp 97.8°F | Resp 20 | Ht 70.0 in | Wt 118.9 lb

## 2020-03-22 DIAGNOSIS — Z79899 Other long term (current) drug therapy: Secondary | ICD-10-CM | POA: Diagnosis not present

## 2020-03-22 DIAGNOSIS — G629 Polyneuropathy, unspecified: Secondary | ICD-10-CM | POA: Diagnosis not present

## 2020-03-22 DIAGNOSIS — C162 Malignant neoplasm of body of stomach: Secondary | ICD-10-CM | POA: Diagnosis not present

## 2020-03-22 DIAGNOSIS — I712 Thoracic aortic aneurysm, without rupture: Secondary | ICD-10-CM | POA: Diagnosis not present

## 2020-03-22 DIAGNOSIS — Z95828 Presence of other vascular implants and grafts: Secondary | ICD-10-CM

## 2020-03-22 DIAGNOSIS — R110 Nausea: Secondary | ICD-10-CM

## 2020-03-22 DIAGNOSIS — C163 Malignant neoplasm of pyloric antrum: Secondary | ICD-10-CM | POA: Diagnosis present

## 2020-03-22 DIAGNOSIS — D509 Iron deficiency anemia, unspecified: Secondary | ICD-10-CM | POA: Diagnosis not present

## 2020-03-22 DIAGNOSIS — E860 Dehydration: Secondary | ICD-10-CM | POA: Diagnosis not present

## 2020-03-22 DIAGNOSIS — D649 Anemia, unspecified: Secondary | ICD-10-CM

## 2020-03-22 DIAGNOSIS — C787 Secondary malignant neoplasm of liver and intrahepatic bile duct: Secondary | ICD-10-CM | POA: Diagnosis not present

## 2020-03-22 DIAGNOSIS — R0989 Other specified symptoms and signs involving the circulatory and respiratory systems: Secondary | ICD-10-CM | POA: Diagnosis not present

## 2020-03-22 LAB — CBC WITH DIFFERENTIAL (CANCER CENTER ONLY)
Abs Immature Granulocytes: 0.02 10*3/uL (ref 0.00–0.07)
Basophils Absolute: 0 10*3/uL (ref 0.0–0.1)
Basophils Relative: 0 %
Eosinophils Absolute: 0 10*3/uL (ref 0.0–0.5)
Eosinophils Relative: 0 %
HCT: 30.3 % — ABNORMAL LOW (ref 39.0–52.0)
Hemoglobin: 9.6 g/dL — ABNORMAL LOW (ref 13.0–17.0)
Immature Granulocytes: 0 %
Lymphocytes Relative: 20 %
Lymphs Abs: 1.3 10*3/uL (ref 0.7–4.0)
MCH: 27.4 pg (ref 26.0–34.0)
MCHC: 31.7 g/dL (ref 30.0–36.0)
MCV: 86.6 fL (ref 80.0–100.0)
Monocytes Absolute: 0.6 10*3/uL (ref 0.1–1.0)
Monocytes Relative: 9 %
Neutro Abs: 4.7 10*3/uL (ref 1.7–7.7)
Neutrophils Relative %: 71 %
Platelet Count: 307 10*3/uL (ref 150–400)
RBC: 3.5 MIL/uL — ABNORMAL LOW (ref 4.22–5.81)
RDW: 16.5 % — ABNORMAL HIGH (ref 11.5–15.5)
WBC Count: 6.7 10*3/uL (ref 4.0–10.5)
nRBC: 0 % (ref 0.0–0.2)

## 2020-03-22 LAB — FERRITIN: Ferritin: 163 ng/mL (ref 24–336)

## 2020-03-22 LAB — CMP (CANCER CENTER ONLY)
ALT: <6 U/L (ref 0–44)
AST: 14 U/L — ABNORMAL LOW (ref 15–41)
Albumin: 2.9 g/dL — ABNORMAL LOW (ref 3.5–5.0)
Alkaline Phosphatase: 85 U/L (ref 38–126)
Anion gap: 8 (ref 5–15)
BUN: 27 mg/dL — ABNORMAL HIGH (ref 8–23)
CO2: 25 mmol/L (ref 22–32)
Calcium: 9.1 mg/dL (ref 8.9–10.3)
Chloride: 103 mmol/L (ref 98–111)
Creatinine: 2.2 mg/dL — ABNORMAL HIGH (ref 0.61–1.24)
GFR, Estimated: 31 mL/min — ABNORMAL LOW (ref >60)
Glucose, Bld: 80 mg/dL (ref 70–99)
Potassium: 4.4 mmol/L (ref 3.5–5.1)
Sodium: 136 mmol/L (ref 135–145)
Total Bilirubin: 0.9 mg/dL (ref 0.3–1.2)
Total Protein: 5.6 g/dL — ABNORMAL LOW (ref 6.5–8.1)

## 2020-03-22 LAB — IRON AND TIBC
Iron: 53 ug/dL (ref 42–163)
Saturation Ratios: 24 % (ref 20–55)
TIBC: 224 ug/dL (ref 202–409)
UIBC: 171 ug/dL (ref 117–376)

## 2020-03-22 LAB — SAMPLE TO BLOOD BANK

## 2020-03-22 MED ORDER — SODIUM CHLORIDE 0.9 % IV SOLN
INTRAVENOUS | Status: AC
  Filled 2020-03-22: qty 250

## 2020-03-22 MED ORDER — DEXAMETHASONE SODIUM PHOSPHATE 10 MG/ML IJ SOLN
8.0000 mg | Freq: Once | INTRAMUSCULAR | Status: AC
  Administered 2020-03-22: 8 mg via INTRAVENOUS

## 2020-03-22 MED ORDER — SODIUM CHLORIDE 0.9 % IV SOLN: 8.0000 mg | Freq: Once | INTRAVENOUS | Status: DC

## 2020-03-22 MED ORDER — DEXAMETHASONE SODIUM PHOSPHATE 10 MG/ML IJ SOLN
INTRAMUSCULAR | Status: AC
  Filled 2020-03-22: qty 1

## 2020-03-22 MED ORDER — HEPARIN SOD (PORK) LOCK FLUSH 100 UNIT/ML IV SOLN
500.0000 [IU] | Freq: Once | INTRAVENOUS | Status: AC | PRN
  Administered 2020-03-22: 500 [IU] via INTRAVENOUS
  Filled 2020-03-22: qty 5

## 2020-03-22 MED ORDER — ONDANSETRON HCL 4 MG/2ML IJ SOLN
INTRAMUSCULAR | Status: AC
  Filled 2020-03-22: qty 4

## 2020-03-22 MED ORDER — SODIUM CHLORIDE 0.9% FLUSH
10.0000 mL | INTRAVENOUS | Status: DC | PRN
  Administered 2020-03-22: 10 mL via INTRAVENOUS
  Filled 2020-03-22: qty 10

## 2020-03-22 MED ORDER — ONDANSETRON HCL 4 MG/2ML IJ SOLN
8.0000 mg | Freq: Once | INTRAMUSCULAR | Status: AC
  Administered 2020-03-22: 8 mg via INTRAVENOUS

## 2020-03-22 NOTE — Patient Instructions (Signed)
Nausea, Adult Nausea is feeling sick to your stomach or feeling that you are about to throw up (vomit). Feeling sick to your stomach is usually not serious, but it may be an early sign of a more serious medical problem. As you feel sicker to your stomach, you may throw up. If you throw up, or if you are not able to drink enough fluids, there is a risk that you may lose too much water in your body (get dehydrated). If you lose too much water in your body, you may:  Feel tired.  Feel thirsty.  Have a dry mouth.  Have cracked lips.  Go pee (urinate) less often. Older adults and people who have other diseases or a weak body defense system (immune system) have a higher risk of losing too much water in the body. The main goals of treating this condition are:  To relieve your nausea.  To ensure your nausea occurs less often.  To prevent throwing up and losing too much fluid. Follow these instructions at home: Watch your symptoms for any changes. Tell your doctor about them. Follow these instructions as told by your doctor. Eating and drinking      Take an ORS (oral rehydration solution). This is a drink that is sold at pharmacies and stores.  Drink clear fluids in small amounts as you are able. These include: ? Water. ? Ice chips. ? Fruit juice that has water added (diluted fruit juice). ? Low-calorie sports drinks.  Eat bland, easy-to-digest foods in small amounts as you are able, such as: ? Bananas. ? Applesauce. ? Rice. ? Low-fat (lean) meats. ? Toast. ? Crackers.  Avoid drinking fluids that have a lot of sugar or caffeine in them. This includes energy drinks, sports drinks, and soda.  Avoid alcohol.  Avoid spicy or fatty foods. General instructions  Take over-the-counter and prescription medicines only as told by your doctor.  Rest at home while you get better.  Drink enough fluid to keep your pee (urine) pale yellow.  Take slow and deep breaths when you feel  sick to your stomach.  Avoid food or things that have strong smells.  Wash your hands often with soap and water. If you cannot use soap and water, use hand sanitizer.  Make sure that all people in your home wash their hands well and often.  Keep all follow-up visits as told by your doctor. This is important. Contact a doctor if:  You feel sicker to your stomach.  You feel sick to your stomach for more than 2 days.  You throw up.  You are not able to drink fluids without throwing up.  You have new symptoms.  You have a fever.  You have a headache.  You have muscle cramps.  You have a rash.  You have pain while peeing.  You feel light-headed or dizzy. Get help right away if:  You have pain in your chest, neck, arm, or jaw.  You feel very weak or you pass out (faint).  You have throw up that is bright red or looks like coffee grounds.  You have bloody or black poop (stools) or poop that looks like tar.  You have a very bad headache, a stiff neck, or both.  You have very bad pain, cramping, or bloating in your belly (abdomen).  You have trouble breathing or you are breathing very quickly.  Your heart is beating very quickly.  Your skin feels cold and clammy.  You feel confused.    You have signs of losing too much water in your body, such as: ? Dark pee, very little pee, or no pee. ? Cracked lips. ? Dry mouth. ? Sunken eyes. ? Sleepiness. ? Weakness. These symptoms may be an emergency. Do not wait to see if the symptoms will go away. Get medical help right away. Call your local emergency services (911 in the [place] Do not drive yourself to the hospital. Summary  Nausea is feeling sick to your stomach or feeling that you are about to throw up (vomit).  If you throw up, or if you are not able to drink enough fluids, there is a risk that you may lose too much water in your body (get dehydrated).  Eat and drink what your doctor tells you. Take  over-the-counter and prescription medicines only as told by your doctor.  Contact a doctor right away if your symptoms get worse or you have new symptoms.  Keep all follow-up visits as told by your doctor. This is important. This information is not intended to replace advice given to you by your health care provider. Make sure you discuss any questions you have with your health care provider. Document Revised: 10/05/2017 Document Reviewed: 10/05/2017 [employer] Patient Education  2020 [employer]  

## 2020-03-23 MED ORDER — BUDESONIDE ER 9 MG PO CP24: 9.0000 mg | ORAL_CAPSULE | Freq: Every day | ORAL | 0 refills | Status: DC

## 2020-03-23 MED ORDER — LEVOFLOXACIN 750 MG PO TABS: 750.0000 mg | ORAL_TABLET | Freq: Every day | ORAL | 0 refills | Status: DC

## 2020-03-25 ENCOUNTER — Telehealth: Payer: Self-pay | Admitting: Hematology

## 2020-03-25 NOTE — Telephone Encounter (Signed)
Scheduled per 11/12 los. Pt stated that he could only come in once this week due to work. Spoke with RN Santiago Glad and ok to schedule only on 11/17. Pt is aware of appt cancelled on 11/16 and is aware of appts on 11/17.

## 2020-03-25 NOTE — Telephone Encounter (Signed)
Scheduled per 11/12 los. Pt is aware of appt times and dates.

## 2020-03-26 ENCOUNTER — Telehealth: Payer: Self-pay | Admitting: *Deleted

## 2020-03-26 ENCOUNTER — Telehealth: Payer: Self-pay | Admitting: Nurse Practitioner

## 2020-03-26 ENCOUNTER — Ambulatory Visit: Payer: Medicare HMO

## 2020-03-26 NOTE — Telephone Encounter (Signed)
Called patient, Gabriel Poole, and his son's phone #'s, no answer [phone].  Cira Rue, NP

## 2020-03-26 NOTE — Telephone Encounter (Signed)
Received vm call from pt stating that his stomach was hurting so bad, "about to kill me".  Called pt & he states he vomits after eating & is afraid to ear.  He is drinking some but everything comes back up.  He has hiccoughs & he asked to be seen today.  He has an appt tomorrow.  He also asked for refill on his pain meds.  Message to Dr Burr Medico.

## 2020-03-26 NOTE — Progress Notes (Addendum)
[place]   [phone] [phone]   Clinic Follow up Note   Patient Care Team: Wannetta Sender, FNP as PCP - General (Family Medicine) Truitt Merle, MD as Consulting Physician (Hematology) Alla Feeling, NP as Nurse Practitioner (Nurse Practitioner) Stark Klein, MD as Consulting Physician (General Surgery) Rogene Houston, MD as Consulting Physician (Gastroenterology) Milus Banister, MD as Attending Physician (Gastroenterology) Karie Mainland, RD as Dietitian (Nutrition) 03/27/2020  CHIEF COMPLAINT: Follow-up gastric cancer  SUMMARY OF ONCOLOGIC HISTORY: Oncology History Overview Note  Cancer Staging Malignant neoplasm of body of stomach ([place]) Staging form: Stomach, AJCC 8th Edition - Clinical stage from 03/11/2017: Stage IIB (cT3, cN0, cM0) - Unsigned - Pathologic stage from 04/22/2017: Stage IIIB (pT4a, pN3a, cM0) - Signed by Alla Feeling, NP on 05/17/2017     Malignant neoplasm of body of stomach ([place])  02/17/2017 Initial Diagnosis   Malignant neoplasm of body of stomach ([place])   02/17/2017 Pathology Results   Diagnosis Stomach, biopsy, gastric ulcer - ADENOCARCINOMA. Microscopic Comment Several of the fragments are involved by moderately differentiated adenocarcinoma.   02/17/2017 Procedure   UPPER ENDOSCOPY  FINDINGS - Normal esophagus. - Z-line irregular, 44 cm from the incisors. - Red blood in the gastric body and in the gastric antrum. - Large gastric ulcer. Biopsied. - Erythematous mucosa in the antrum. - Normal cardia, gastric fundus, gastric body and pylorus. - Normal duodenal bulb and second portion of the duodenum. Comment: Endoscopic appearence concerning for malignant ulcer.   03/11/2017 Procedure   UPPER EUS PER DR. Ardis Hughs Findings: 1. The esophagus was normal. 2. There was a 2-3cm ulcerated, malignant mass along the greater curvature of the stomach, approximately mid-body. The mass was  non-circumferential. 3. The duodenum was normal. Endosonographic Finding 1. The gastric mass above correlated with a hypoechoic and heterogenous non-circumferential mass that measured 2.9cm across, 68m deep. The endosonographic borders were poorly defined and there was clear sonographic evidence suggesting invasion into and through the muscularis propria layer without evident invasion into nearby organs (uT3). 2. The duodenal lymphnode described on recent CT scan appeared reactive by [place]. 3. No perigastric adenopathy (uN0) 4. Limited views of the liver, spleen, pancreas, bile duct, gallbladder were all normal. - Along the greater curvature of the stomach, approximately mid-body, there is a 2.9cm uT3N0 (clinical stage IIB) gastric adenocarcinoma.   04/06/2017 Imaging   CT CHEST IMPRESSION: 1. No acute cardiopulmonary abnormalities. 2. Two small pulmonary nodules are noted measuring up to 5 mm. Nonspecific but warrant attention on follow-up imaging follow up 3. Nonspecific hyperdense, possibly enhancing lesion along the dome of liver is identified. In a patient that is at increased risk for more definitive assessment of this structure with contrast enhanced MRI of the liver is advised.   04/21/2017 Imaging   Gabriel ABDOMEN IMPRESSION: 1. Enhancing 2.9 cm mass along the lesser curvature in the gastric antrum, compatible with known gastric malignancy . 2. Mildly enlarged gastrohepatic ligament lymph node, cannot exclude nodal metastasis. 3. No definite liver metastatic disease. Hyperenhancing 0.9 cm liver dome mass remains inconclusive, although the MRI features are most suggestive of a flash filling hemangioma, and the mass has been stable for nearly 2 months. Additional subcentimeter focus of hyperenhancement in the lateral segment left liver lobe is most likely a benign transient vascular phenomenon. Recommend attention to these lesions on a follow-up MRI abdomen without and with  IV contrast in 3-6 months. This recommendation follows ACR consensus guidelines:  Management of Incidental Liver Lesions on CT: A White Paper of the ACR Incidental Findings Committee. J Am Coll Radiol 2017; 16:0737-1062. 4. Benign right adrenal adenoma.     04/22/2017 Pathology Results   Diagnosis 1. Liver, biopsy - BILE DUCT HAMARTOMA. - THERE IS NO EVIDENCE OF MALIGNANCY. 2. Lymph nodes, regional resection, portal - METASTATIC CARCINOMA IN 2 OF 6 LYMPH NODES (2/6). 3. Stomach, resection for tumor, distal - INVASIVE ADENOCARCINOMA, POORLY DIFFERENTIATED, SPANNING 3.8 CM. - PERINEURAL INVASION IS IDENTIFIED. - ADENOCARCINOMA INVOLVES THE SEROSA. - METASTATIC CARCINOMA IN 12 OF 30 LYMPH NODES (12/30), WITH EXTRACAPSULAR EXTENSION. - SEE ONCOLOGY TABLE BELOW. 4. Lymph node, biopsy, common hepatic artery - THERE IS NO EVIDENCE OF CARCINOMA IN 1 OF 1 LYMPH NODE (0/1). Microscopic Comment 3. STOMACH: Specimen: Stomach. Procedure: Partial gastrectomy. Tumor Site: Greater curvature. Tumor Size: 3.8 cm Histologic Type: Adenocarcinoma. Histologic Grade: G3: poorly differentiated. Microscopic Extent of Tumor: Adenocarcinoma involves the serosa. Margins (select all that apply): Adenoca Proximal Margin: Negative for adenocarcinoma. Distal Margin: Negative for adenocarcinoma. Treatment Effect: N/A Lymph-Vascular Invasion: Not identified. Perineural Invasion: Present. Additional findings: Chronic gastritis. Ancillary testing: Can be performed upon clinician request. 1 of 3 Duplicate copy FINAL for Gabriel, Poole ([id number]) Microscopic Comment(continued) Lymph nodes: number examined - 37; number positive: 14 Pathologic Staging: pT4a, pN3a (JBK:gt, 04/27/17)   06/02/2017 - 09/30/2017 Adjuvant Chemotherapy   FOLFOX every 2 weeks, oxaliplatin stopped in March 2019 due to neuropathy, chemo stopped on 09/30/2017 per pt's request    10/29/2017 Imaging   10/29/2017 CT CAP    IMPRESSION: 1. Status post distal gastrectomy. No evidence for metastatic disease in the chest, abdomen, or pelvis. 2. Stable tiny right pulmonary nodules. Continued attention on follow-up recommended. 3. 9 mm hypervascular lesion in the dome of the liver is stable over multiple studies back to 04/05/2017. Continued attention on follow-up suggested. 4. Ascending thoracic aorta measures 4.2 cm diameter. Recommend annual imaging followup by CTA or MRA. This recommendation follows 2010 ACCF/AHA/AATS/ACR/ASA/SCA/SCAI/SIR/STS/SVM Guidelines for the Diagnosis and Management of Patients with Thoracic Aortic Disease. Circulation. 2010; 121: O350-K938 . 5. Marked prostatomegaly. 6.  Aortic Atherosclerois (ICD10-170.0)   05/09/2018 Imaging   05/09/2018 CT CAP IMPRESSION: 1. No definite findings to suggest metastatic disease to the chest, abdomen or pelvis. 2. Multiple small pulmonary nodules scattered throughout the lungs bilaterally measuring 5 mm or less in size, stable compared to prior studies from 2018, favored to be benign. Continued attention on future follow-up examinations is recommended. 3. Aortic atherosclerosis with ectasia of the ascending thoracic aorta (4 cm in diameter). Recommend annual imaging followup by CTA or MRA. This recommendation follows 2010 ACCF/AHA/AATS/ACR/ASA/SCA/SCAI/SIR/STS/SVM Guidelines for the Diagnosis and Management of Patients with Thoracic Aortic Disease. Circulation. 2010; 121: H829-H371. 4. Multiple small calculi lying dependently in the right-side of the urinary bladder. 5. Severe prostatomegaly. 6. Additional incidental findings, as above.   04/04/2019 Imaging   CT AP IMPRESSION: 1. Redemonstrated postoperative findings of distal gastrectomy and gastrojejunostomy (series 2, image 18, series 4, image 27). No evidence of malignant recurrence or metastatic disease in the abdomen or pelvis. 2. Subcutaneous body fat is substantially diminished  in comparison to prior examination, in keeping with reported history of weight loss. 3. Gross prostatomegaly and urinary bladder wall thickening, likely due to chronic outlet obstruction. 4.  Aortic Atherosclerosis (ICD10-I70.0).   08/16/2019 Imaging   CT CAP w contrast IMPRESSION: 1. No acute findings within the chest, abdomen or pelvis. No specific findings identified to suggest residual  or recurrent tumor or metastatic disease. 2. Small nonspecific pulmonary nodules are unchanged. 3. Marked enlargement of the prostate gland. 4. Multiple tiny bladder stones. 5. Aortic atherosclerosis. Aortic Atherosclerosis (ICD10-I70.0).   11/02/2019 Procedure   EGD impression - Normal hypopharynx. - Normal esophagus. - Z-line regular, 45 cm from the incisors. - Patent Billroth II gastrojejunostomy was found. 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 Biopsied. - Gastric ulcer with raised margins appears to be separate lesion. Biopsied.  comment: recurrent tumor at anastamosis appears to be separate form fundal lesion. these lesions are the source of chronic blood loss.  Colonoscopy impression: - Perianal skin tags found on perianal exam. - The entire examined colon is normal. - External and internal hemorrhoids. - No specimens collected.   11/02/2019 Pathology Results   FINAL MICROSCOPIC DIAGNOSIS:  A. STOMACH, BIOPSY:  -  Poorly differentiated carcinoma  -  See comment  B. STOMACH, FUNDUS, BIOPSY:  -  Adenocarcinoma  -  See comment  COMMENT:  A and B.  The carcinoma in part A is more poorly differentiated than  what is seen in part B but both are favored to be adenocarcinoma.  Dr.  Jeannie Done reviewed the case and agrees with the above diagnosis. Dr.  Laural Golden was notified of these results on November 03, 2019.    12/05/2019 PET scan   IMPRESSION: Marked hypermetabolic activity within the thickened stomach in this patient with known gastric cancer with associated metastatic disease to  the liver and small lymph nodes in the area of the hepatic gastric recess that are suspicious based on location and size though not particularly FDG avid.   Small lymph nodes along the posterolateral margin of the aorta with mild increased FDG uptake (image 107) (SUVmax = 2.8 these are approximately 6-7 mm and there are several small nodes tracking beneath the LEFT retrocrural region. Attention on follow-up)   Increased FDG uptake in the central portion of the prostate likely within the prosthetic urethra given the marked hypermetabolic features in the central location. Would however consider correlation with PSA as clinically warranted. Prostate is markedly enlarged otherwise with heterogeneous FDG uptake.   Aortic Atherosclerosis (ICD10-I70.0).    12/07/2019 -  Chemotherapy   FOLFOX every 2 weeks starting 12/07/19. Add Transtuzumab (Kanjinti) q2 weeks and Keytruda q6weeks with C3 on 01/24/20.    12/14/2019 Pathology Results   FINAL MICROSCOPIC DIAGNOSIS:   A. LIVER, NEEDLE CORE BIOPSY:  - Poorly differentiated adenocarcinoma, see comment.   COMMENT:   The tumor has a similar appearance to the patient's recent gastric  biopsies ([id number]). There is sufficient material for additional  testing if requested.    03/14/2020 Imaging   CT CAP IMPRESSION: 1. The small hypodense lesion in segment 4 of the liver was previously hypermetabolic, and is subjectively reduced in size compared to prior PET-CT. 2. Dilated loops of small bowel anteriorly without a well-defined lead point for obstruction. This could be from ileus or low-grade partial small bowel obstruction. 3. Continued gastric wall thickening is not entirely specific but residual gastric tumor is not excluded. Postoperative findings along the residual stomach. 4. Cannot exclude wall thickening in the nondistended ascending colon and hepatic flexure. 5. Diffuse low-grade subcutaneous and mesenteric edema. Trace ascites in  the pelvis. Appearance favors third spacing of fluid. 6. Bilateral airway thickening with airway plugging in both lower lobes and resulting airspace opacities due to atelectasis and possibly pneumonia in the lower lobes. 7. Marked prostatomegaly. 8. Stable 4.2 cm ascending thoracic aortic  aneurysm. 9. Aortic atherosclerosis.     CURRENT THERAPY: FOLFOX every 2 weeks starting 12/07/19. Add Transtuzumab(Kanjinti) q2 weeks and Keytruda q6weeks with C3 on 01/24/20.Held since 03/22/20 due to chest congestion and bowel inflammation.   INTERVAL HISTORY: Gabriel Poole returns for follow-up and supportive care as scheduled.  He was last seen on 11/12, treatment was held for possible respiratory infection and GI inflammation.  He was given IVF supportive care and budesonide, Levaquin. He called reporting abdominal pain and vomiting on 11/16, no answer when I called back.   Gabriel Poole presents alone today. He feels progressively worse since last week. He took steroids for bowel inflammation and levaquin for chest congestion but pharmacy only dispenses 3 antibiotic tabs at a time.  Continues to have a cough, no chest pain, dyspnea, fever or chills.  He has worsening epigastric pain.  He has a sore throat when he swallows liquids but cannot keep it down, vomits food about 30 minutes to an hour after eating.  Has not had a bowel movement since last week with MiraLAX.  Has not been to work in the last few days.  Denies other new concerns.   MEDICAL HISTORY:  Past Medical History:  Diagnosis Date  . Arthritis   . Cancer [facility]) dx oct 02-2017   stomach  . Gout   . Heart murmur   . Hyperlipidemia   . Hypertension   . Stomach cancer [facility])     SURGICAL HISTORY: Past Surgical History:  Procedure Laterality Date  . BIOPSY  11/02/2019   Procedure: BIOPSY;  Surgeon: Rogene Houston, MD;  [facility];  Service: Endoscopy;;  gastric  . COLONOSCOPY    . COLONOSCOPY N/A 11/02/2019   Procedure:  COLONOSCOPY;  Surgeon: Rogene Houston, MD;  [facility];  Service: Endoscopy;  Laterality: N/A;  100  . ESOPHAGOGASTRODUODENOSCOPY N/A 02/17/2017   Procedure: ESOPHAGOGASTRODUODENOSCOPY (EGD);  Surgeon: Rogene Houston, MD;  [facility];  Service: Endoscopy;  Laterality: N/A;  3:00  . ESOPHAGOGASTRODUODENOSCOPY N/A 11/02/2019   Procedure: ESOPHAGOGASTRODUODENOSCOPY (EGD);  Surgeon: Rogene Houston, MD;  [facility];  Service: Endoscopy;  Laterality: N/A;  . EUS N/A 03/11/2017   Procedure: UPPER ENDOSCOPIC ULTRASOUND (EUS) LINEAR;  Surgeon: Milus Banister, MD;  [facility];  Service: Endoscopy;  Laterality: N/A;  . EUS N/A 03/11/2017   Procedure: UPPER ENDOSCOPIC ULTRASOUND (EUS) RADIAL;  Surgeon: Milus Banister, MD;  Location: [facility];  Service: Endoscopy;  Laterality: N/A;  . FINGER SURGERY     Lt middle   . GASTRECTOMY N/A 04/22/2017   Procedure: PARTIAL GASTRECTOMY;  Surgeon: Stark Klein, MD;  Location: [place];  Service: General;  Laterality: N/A;  . GASTROJEJUNOSTOMY N/A 04/22/2017   Procedure: JEJUNAL FEEDING TUBE PLACEMENT;  Surgeon: Stark Klein, MD;  Location: [place];  Service: General;  Laterality: N/A;  . HYDROCELE EXCISION  02/12/2011   Procedure: HYDROCELECTOMY ADULT;  Surgeon: Marissa Nestle;  Location: [facility];  Service: Urology;  Laterality: Left;  . LAPAROSCOPY N/A 04/22/2017   Procedure: LAPAROSCOPY DIAGNOSTIC ERAS PATHWAY;  Surgeon: Stark Klein, MD;  Location: [place];  Service: General;  Laterality: N/A;  EPIDURAL  . PORTACATH PLACEMENT N/A 05/28/2017   Procedure: INSERTION PORT-A-CATH ERAS PATHWAY;  Surgeon: Stark Klein, MD;  Location: [place];  Service: General;  Laterality: N/A;    I have reviewed the social history and family history with the patient and they are unchanged from  previous note.  ALLERGIES:  is allergic to penicillins.  MEDICATIONS:  Current Outpatient Medications    Medication Sig Dispense Refill  . amLODipine (NORVASC) 10 MG tablet Take 1 tablet (10 mg total) by mouth daily. 90 tablet 0  . atorvastatin (LIPITOR) 40 MG tablet Take 1 tablet (40 mg total) by mouth daily at 6 PM. 90 tablet 0  . Budesonide ER 9 MG CP24 Take 9 mg by mouth daily. 14 capsule 0  . Cholecalciferol (VITAMIN D3) 5000 units CAPS Take 5,000 Units by mouth daily.    . finasteride (PROSCAR) 5 MG tablet Take 5 mg by mouth daily.    Marland Kitchen HYDROcodone-acetaminophen (NORCO/VICODIN) 5-325 MG tablet TAKE 1 TABLET EVERY 6 HOURS AS NEEDED FOR MODERATE PAIN 60 tablet 0  . HYDROcodone-acetaminophen (NORCO/VICODIN) 5-325 MG tablet Take 1 tablet by mouth every 6 (six) hours as needed for moderate pain. 60 tablet 0  . levofloxacin (LEVAQUIN) 750 MG tablet Take 1 tablet (750 mg total) by mouth daily. 7 tablet 0  . lidocaine-prilocaine (EMLA) cream Apply to affected area once 30 g 3  . megestrol (MEGACE ES) 625 MG/5ML suspension Take 5 mLs (625 mg total) by mouth daily. 150 mL 0  . ondansetron (ZOFRAN) 8 MG tablet Take 1 tablet (8 mg total) by mouth 2 (two) times daily as needed for refractory nausea / vomiting. Start on day 3 after chemotherapy. 30 tablet 1  . pantoprazole (PROTONIX) 40 MG tablet Take 1 tablet (40 mg total) by mouth daily. 30 tablet 2  . potassium chloride SA (K-DUR,KLOR-CON) 20 MEQ tablet Take 1 tablet (20 mEq total) by mouth 2 (two) times daily. 40 tablet 1  . prochlorperazine (COMPAZINE) 10 MG tablet Take 1 tablet (10 mg total) by mouth every 6 (six) hours as needed (Nausea or vomiting). 30 tablet 1  . tadalafil (CIALIS) 5 MG tablet Take 5 mg by mouth daily.    . tamsulosin (FLOMAX) 0.4 MG CAPS capsule Take 0.4 mg by mouth daily.    Marland Kitchen triamterene-hydrochlorothiazide (MAXZIDE-25) 37.5-25 MG tablet Take 1 tablet by mouth daily.     Current Facility-Administered Medications  Medication Dose Route Frequency Provider Last Rate Last Admin  . sodium chloride flush (NS) 0.9 % injection 10 mL   10 mL Intravenous PRN Truitt Merle, MD   10 mL at 03/27/20 1115    PHYSICAL EXAMINATION: ECOG PERFORMANCE STATUS: 2 - Symptomatic, <50% confined to bed  Vitals:   03/27/20 1056  BP: 114/75  Pulse: 69  Resp: 16  Temp: 98.6 F (37 C)  SpO2: 99%   Filed Weights   03/27/20 1056  Weight: 113 lb 11.2 oz (51.6 kg)    GENERAL:alert, no distress and comfortable SKIN: No rash EYES:  sclera clear OROPHARYNX: No thrush or ulcers LUNGS: clear with normal breathing effort HEART: regular rate & rhythm, no lower extremity edema ABDOMEN:abdomen soft, normal bowel sounds.  Tender throughout the abdomen more in the epigastric area NEURO: alert & oriented x 3 with fluent speech PAC without erythema  LABORATORY DATA:  I have reviewed the data as listed CBC Latest Ref Rng & Units 03/27/2020 03/22/2020 03/06/2020  WBC 4.0 - 10.5 K/uL 6.6 6.7 5.4  Hemoglobin 13.0 - 17.0 g/dL 9.4(L) 9.6(L) 8.3(L)  Hematocrit 39 - 52 % 30.2(L) 30.3(L) 26.4(L)  Platelets 150 - 400 K/uL 289 307 317     CMP Latest Ref Rng & Units 03/27/2020 03/22/2020 03/06/2020  Glucose 70 - 99 mg/dL 84 80 82  BUN 8 - 23  mg/dL 43(H) 27(H) 14  Creatinine 0.61 - 1.24 mg/dL 1.53(H) 2.20(H) 0.83  Sodium 135 - 145 mmol/L 139 136 137  Potassium 3.5 - 5.1 mmol/L 3.8 4.4 3.8  Chloride 98 - 111 mmol/L 106 103 107  CO2 22 - 32 mmol/L _0 Calcium 8.9 - 10.3 mg/dL 8.8(L) 9.1 9.2  Total Protein 6.5 - 8.1 g/dL 5.5(L) 5.6(L) 5.9(L)  Total Bilirubin 0.3 - 1.2 mg/dL 0.6 0.9 0.4  Alkaline Phos 38 - 126 U/L 76 85 82  AST 15 - 41 U/L 21 14(L) 13(L)  ALT 0 - 44 U/L 8 <6 8      RADIOGRAPHIC STUDIES: I have personally reviewed the radiological images as listed and agreed with the findings in the report. No results found.   ASSESSMENT & PLAN: Naasir Carreira Daltonis a 74y.o.malewith   1.  Progressive epigastric pain, vomiting, weight loss -Secondary to cancer and treatment, ? Obstruction -Pending hospital admission for supportive  care and further evaluation/management -Continue holding chemo for now  2. Primary adenocarcinoma of the pyloric antrum, pT4a, PN3aM0, stage IIIB, Grade3, MSI-stable, recurrence at the anastomosis and new foci in the gastric fundus in 10/2019, and liver mets 12/2019  -Diagnosed in 02/2017.S/psurgical resection and adjuvant chemotherapyFOLFOX. Patient was not compliant with chemo, oxaliplatin stopped after 4 cycles, and 5-FU stopped after 8 cycles treatment per his request. -His last colonoscopy was in 2015, previously followedby Dr. Paulita Fujita.  -surveillance CTs have been negative for recurrence or metastatic disease, last done 08/16/19 -He developed weight loss and recurrent IDA. GI work-up 10/2019 by Dr. Laural Golden shows recurrent poorly differentiated adenocarcionma at the anastomosis with additional focus of adeno in the gastric fundus -The case was discussed in GI tumor board, Dr. Barry Dienes did not offer surgery  -His PET from 12/05/2019 showed known gastric cancer with associated metastatic disease in the liver and small lymph nodes in the area of the hepatic gastric recess.  Liver biopsy from 12/14/2019 confirmed metastatic disease. -He began first-line palliative FOLFOX every 2 weeks on 12/07/2019.  Tolerating well. -Molecular testing shows PD-L1 < 1% negative, EBV negative, MSI-stable, HER2 + (3+). -Trastuzumab every 2 weeks and pembrolizumab every 6 weeks were added 01/24/2020.  The treatment goal is palliative -S/p 6 cycles, last dosed 03/06/2020, CT 03/14/2020 shows partial treatment response -He developed worsening epigastric pain, vomiting, weight loss and possible URI, treatment was held on 11/12 -He appears worse today, more epigastric pain and vomiting, further weight loss and low PS.  We are recommending for him to be admitted for symptom management, rule out obstruction, and GI consult for possible endoscopic evaluation and management  3. Recurrent iron deficiency anemia  -he initially had  low serum iron and low transferrin saturation in 06/2017 after cancer resection; iron and Ferritin were normal in the interval. -he developed recurrent IDA in 03/2019, required blood transfusion and IV iron -his work up in 10/2019 per Dr. Laural Golden showed recurrent adenocarcinoma at the anastomosis and cratered ulcer in the gastric fundus, path showed adeno as well. This is the source of his bleeding/IDA.  -Continue IV iron as needed  4. Weight loss, low appetite, occasional postprandial emesis -related to prior surgeryand now cancer recurrence -Followed by dietitian -Increase nutrition supplements to 2/day -started Megace for appetite stimulation -He continues to have weight loss  5.Ascendingaortic aneurysm -stable at 4 cm per CT CAP on 08/16/19  6. Smokingcessation -Previously counseled on smoking cessation.  He continues to smoke -Previously treated with Levaquin (11/12) for chest congestion  7. BPH and elevated PSA -severe prostatomegalynoted on multiple scansincluding CT CAP from 08/16/19 -has urologistin [place]  Disposition: Gabriel Poole is chronically ill-appearing but stable.  He has progressive epigastric pain, vomiting, and weight loss despite chemo break.  He is not able to support his nutrition at home, no BM since last week, and low performance status.  We will continue holding chemo for now.    We are referring him for admission for symptom management and GI evaluation and management, to rule out obstruction and consider endoscopic evaluation.  His case was accepted by hospitalist Dr. Marylyn Ishihara.  Vital signs and labs are stable.  We will support him in the clinic until a bed is assigned.  Routine Covid testing is pending.  Oncology to follow while inpatient.    The patient was seen with Dr. Burr Medico.  All questions were answered. The patient knows to call the clinic with any problems, questions or concerns. No barriers to learning were detected.     Alla Feeling,  NP 03/27/20   Addendum: Fluids complete, patient felt better. VSS. Able to tolerate po while in clinic. Requesting to be de-accessed and go home to take care of things. He agrees to return 03/28/20 for admission. He understands a bed may not be available. He understands he is leaving against medical advice. I will send a message to his GI Dr. Laural Golden. We will follow Ms. Poole closely. If he is not admitted, will see him back in clinic for close monitoring in the next 1-2 days. Dr. Burr Medico notified.   Alla Feeling, NP  03/27/2020   Addendum  Gabriel Poole has persistent abdominal pain, N/V and has not had BM since last week. Abdomen is soft but has diffuse tenderness, VS stable, afebrile. I recommend hospital admission for more work up, given his recent ileus on CT scan and persistent GI symptoms. He initially agreed, but declined after IVF because he felt better. He is agreeable to return tomorrow for f/u and possible direct admission tomorrow. I will reach out to his GI Dr. Laural Golden also. Will need GI and surgical consult if he is admitted.  Truitt Merle  03/27/2020

## 2020-03-27 ENCOUNTER — Telehealth: Payer: Self-pay | Admitting: Emergency Medicine

## 2020-03-27 ENCOUNTER — Other Ambulatory Visit: Payer: Self-pay

## 2020-03-27 ENCOUNTER — Inpatient Hospital Stay (HOSPITAL_BASED_OUTPATIENT_CLINIC_OR_DEPARTMENT_OTHER): Payer: Medicare HMO | Admitting: Nurse Practitioner

## 2020-03-27 ENCOUNTER — Other Ambulatory Visit: Payer: Self-pay | Admitting: Hematology

## 2020-03-27 ENCOUNTER — Inpatient Hospital Stay: Admission: AD | Admit: 2020-03-27 | Payer: Medicare HMO | Source: Ambulatory Visit | Admitting: Internal Medicine

## 2020-03-27 ENCOUNTER — Inpatient Hospital Stay: Payer: Medicare HMO

## 2020-03-27 ENCOUNTER — Other Ambulatory Visit: Payer: Self-pay | Admitting: Medical

## 2020-03-27 ENCOUNTER — Inpatient Hospital Stay (HOSPITAL_BASED_OUTPATIENT_CLINIC_OR_DEPARTMENT_OTHER): Payer: Medicare HMO | Admitting: Medical

## 2020-03-27 ENCOUNTER — Encounter: Payer: Self-pay | Admitting: Nurse Practitioner

## 2020-03-27 VITALS — BP 150/88 | HR 76 | Temp 98.5°F | Resp 16

## 2020-03-27 VITALS — BP 114/75 | HR 69 | Temp 98.6°F | Resp 16 | Ht 70.0 in | Wt 113.7 lb

## 2020-03-27 DIAGNOSIS — Z95828 Presence of other vascular implants and grafts: Secondary | ICD-10-CM

## 2020-03-27 DIAGNOSIS — C162 Malignant neoplasm of body of stomach: Secondary | ICD-10-CM

## 2020-03-27 DIAGNOSIS — R109 Unspecified abdominal pain: Secondary | ICD-10-CM | POA: Diagnosis not present

## 2020-03-27 DIAGNOSIS — K567 Ileus, unspecified: Secondary | ICD-10-CM | POA: Diagnosis not present

## 2020-03-27 DIAGNOSIS — Z751 Person awaiting admission to adequate facility elsewhere: Secondary | ICD-10-CM

## 2020-03-27 LAB — CBC WITH DIFFERENTIAL (CANCER CENTER ONLY)
Abs Immature Granulocytes: 0.04 10*3/uL (ref 0.00–0.07)
Basophils Absolute: 0 10*3/uL (ref 0.0–0.1)
Basophils Relative: 0 %
Eosinophils Absolute: 0 10*3/uL (ref 0.0–0.5)
Eosinophils Relative: 0 %
HCT: 30.2 % — ABNORMAL LOW (ref 39.0–52.0)
Hemoglobin: 9.4 g/dL — ABNORMAL LOW (ref 13.0–17.0)
Immature Granulocytes: 1 %
Lymphocytes Relative: 21 %
Lymphs Abs: 1.4 10*3/uL (ref 0.7–4.0)
MCH: 27.6 pg (ref 26.0–34.0)
MCHC: 31.1 g/dL (ref 30.0–36.0)
MCV: 88.6 fL (ref 80.0–100.0)
Monocytes Absolute: 0.6 10*3/uL (ref 0.1–1.0)
Monocytes Relative: 10 %
Neutro Abs: 4.5 10*3/uL (ref 1.7–7.7)
Neutrophils Relative %: 68 %
Platelet Count: 289 10*3/uL (ref 150–400)
RBC: 3.41 MIL/uL — ABNORMAL LOW (ref 4.22–5.81)
RDW: 17.5 % — ABNORMAL HIGH (ref 11.5–15.5)
WBC Count: 6.6 10*3/uL (ref 4.0–10.5)
nRBC: 0 % (ref 0.0–0.2)

## 2020-03-27 LAB — CMP (CANCER CENTER ONLY)
ALT: 8 U/L (ref 0–44)
AST: 21 U/L (ref 15–41)
Albumin: 2.9 g/dL — ABNORMAL LOW (ref 3.5–5.0)
Alkaline Phosphatase: 76 U/L (ref 38–126)
Anion gap: 7 (ref 5–15)
BUN: 43 mg/dL — ABNORMAL HIGH (ref 8–23)
CO2: 26 mmol/L (ref 22–32)
Calcium: 8.8 mg/dL — ABNORMAL LOW (ref 8.9–10.3)
Chloride: 106 mmol/L (ref 98–111)
Creatinine: 1.53 mg/dL — ABNORMAL HIGH (ref 0.61–1.24)
GFR, Estimated: 48 mL/min — ABNORMAL LOW (ref >60)
Glucose, Bld: 84 mg/dL (ref 70–99)
Potassium: 3.8 mmol/L (ref 3.5–5.1)
Sodium: 139 mmol/L (ref 135–145)
Total Bilirubin: 0.6 mg/dL (ref 0.3–1.2)
Total Protein: 5.5 g/dL — ABNORMAL LOW (ref 6.5–8.1)

## 2020-03-27 LAB — SARS CORONAVIRUS 2 (TAT 6-24 HRS): SARS Coronavirus 2: NEGATIVE

## 2020-03-27 MED ORDER — SODIUM CHLORIDE 0.9% FLUSH
10.0000 mL | INTRAVENOUS | Status: DC | PRN
  Administered 2020-03-27: 10 mL via INTRAVENOUS
  Filled 2020-03-27: qty 10

## 2020-03-27 MED ORDER — SODIUM CHLORIDE 0.9% FLUSH
10.0000 mL | Freq: Once | INTRAVENOUS | Status: AC | PRN
  Administered 2020-03-27: 10 mL
  Filled 2020-03-27: qty 10

## 2020-03-27 MED ORDER — SODIUM CHLORIDE 0.9 % IV SOLN
Freq: Once | INTRAVENOUS | Status: AC
  Filled 2020-03-27: qty 250

## 2020-03-27 MED ORDER — HEPARIN SOD (PORK) LOCK FLUSH 100 UNIT/ML IV SOLN
500.0000 [IU] | Freq: Once | INTRAVENOUS | Status: AC | PRN
  Administered 2020-03-27: 500 [IU]
  Filled 2020-03-27: qty 5

## 2020-03-27 NOTE — Progress Notes (Signed)
Gabriel Poole was seen for COVID-19 testing prior to his admission.  A COVID-19 sample was collected and was taken to the lab.  Sandi Mealy, MHS, PA-C Physician Assistant

## 2020-03-27 NOTE — Patient Instructions (Signed)

## 2020-03-28 ENCOUNTER — Inpatient Hospital Stay: Payer: Medicare HMO

## 2020-03-28 ENCOUNTER — Encounter (HOSPITAL_COMMUNITY): Payer: Self-pay

## 2020-03-28 ENCOUNTER — Emergency Department (HOSPITAL_COMMUNITY): Payer: Medicare HMO

## 2020-03-28 ENCOUNTER — Inpatient Hospital Stay: Payer: Medicare HMO | Admitting: Hematology

## 2020-03-28 ENCOUNTER — Inpatient Hospital Stay (HOSPITAL_COMMUNITY)
Admission: EM | Admit: 2020-03-28 | Discharge: 2020-03-30 | DRG: 388 | Disposition: A | Discharge status: Home / Self Care | Payer: Medicare HMO | Attending: Internal Medicine | Admitting: Internal Medicine

## 2020-03-28 ENCOUNTER — Other Ambulatory Visit: Payer: Self-pay

## 2020-03-28 ENCOUNTER — Observation Stay (HOSPITAL_COMMUNITY): Payer: Medicare HMO

## 2020-03-28 DIAGNOSIS — I10 Essential (primary) hypertension: Secondary | ICD-10-CM | POA: Diagnosis present

## 2020-03-28 DIAGNOSIS — D630 Anemia in neoplastic disease: Secondary | ICD-10-CM | POA: Diagnosis present

## 2020-03-28 DIAGNOSIS — R1110 Vomiting, unspecified: Secondary | ICD-10-CM

## 2020-03-28 DIAGNOSIS — R109 Unspecified abdominal pain: Principal | ICD-10-CM | POA: Diagnosis present

## 2020-03-28 DIAGNOSIS — Z87891 Personal history of nicotine dependence: Secondary | ICD-10-CM

## 2020-03-28 DIAGNOSIS — K2210 Ulcer of esophagus without bleeding: Secondary | ICD-10-CM | POA: Diagnosis present

## 2020-03-28 DIAGNOSIS — Z880 Allergy status to penicillin: Secondary | ICD-10-CM

## 2020-03-28 DIAGNOSIS — C169 Malignant neoplasm of stomach, unspecified: Secondary | ICD-10-CM | POA: Diagnosis not present

## 2020-03-28 DIAGNOSIS — T182XXA Foreign body in stomach, initial encounter: Secondary | ICD-10-CM | POA: Diagnosis present

## 2020-03-28 DIAGNOSIS — E785 Hyperlipidemia, unspecified: Secondary | ICD-10-CM | POA: Diagnosis present

## 2020-03-28 DIAGNOSIS — Z79899 Other long term (current) drug therapy: Secondary | ICD-10-CM

## 2020-03-28 DIAGNOSIS — K56609 Unspecified intestinal obstruction, unspecified as to partial versus complete obstruction: Secondary | ICD-10-CM | POA: Diagnosis present

## 2020-03-28 DIAGNOSIS — R1013 Epigastric pain: Secondary | ICD-10-CM

## 2020-03-28 DIAGNOSIS — Z20822 Contact with and (suspected) exposure to covid-19: Secondary | ICD-10-CM | POA: Diagnosis present

## 2020-03-28 DIAGNOSIS — K567 Ileus, unspecified: Principal | ICD-10-CM | POA: Diagnosis present

## 2020-03-28 DIAGNOSIS — C162 Malignant neoplasm of body of stomach: Secondary | ICD-10-CM | POA: Diagnosis present

## 2020-03-28 DIAGNOSIS — R112 Nausea with vomiting, unspecified: Secondary | ICD-10-CM | POA: Diagnosis not present

## 2020-03-28 DIAGNOSIS — X58XXXA Exposure to other specified factors, initial encounter: Secondary | ICD-10-CM | POA: Diagnosis present

## 2020-03-28 DIAGNOSIS — K2100 Gastro-esophageal reflux disease with esophagitis, without bleeding: Secondary | ICD-10-CM | POA: Diagnosis present

## 2020-03-28 DIAGNOSIS — C787 Secondary malignant neoplasm of liver and intrahepatic bile duct: Secondary | ICD-10-CM | POA: Diagnosis present

## 2020-03-28 DIAGNOSIS — N400 Enlarged prostate without lower urinary tract symptoms: Secondary | ICD-10-CM | POA: Diagnosis present

## 2020-03-28 DIAGNOSIS — E43 Unspecified severe protein-calorie malnutrition: Secondary | ICD-10-CM | POA: Diagnosis present

## 2020-03-28 DIAGNOSIS — Z7189 Other specified counseling: Secondary | ICD-10-CM

## 2020-03-28 DIAGNOSIS — Z681 Body mass index (BMI) 19 or less, adult: Secondary | ICD-10-CM

## 2020-03-28 DIAGNOSIS — K9423 Gastrostomy malfunction: Secondary | ICD-10-CM | POA: Diagnosis present

## 2020-03-28 DIAGNOSIS — R64 Cachexia: Secondary | ICD-10-CM | POA: Diagnosis present

## 2020-03-28 LAB — COMPREHENSIVE METABOLIC PANEL
ALT: 17 U/L (ref 0–44)
AST: 27 U/L (ref 15–41)
Albumin: 3.3 g/dL — ABNORMAL LOW (ref 3.5–5.0)
Alkaline Phosphatase: 89 U/L (ref 38–126)
Anion gap: 11 (ref 5–15)
BUN: 40 mg/dL — ABNORMAL HIGH (ref 8–23)
CO2: 22 mmol/L (ref 22–32)
Calcium: 9.2 mg/dL (ref 8.9–10.3)
Chloride: 108 mmol/L (ref 98–111)
Creatinine, Ser: 1.21 mg/dL (ref 0.61–1.24)
GFR, Estimated: >60 mL/min (ref >60)
Glucose, Bld: 107 mg/dL — ABNORMAL HIGH (ref 70–99)
Potassium: 4.2 mmol/L (ref 3.5–5.1)
Sodium: 141 mmol/L (ref 135–145)
Total Bilirubin: 0.8 mg/dL (ref 0.3–1.2)
Total Protein: 5.8 g/dL — ABNORMAL LOW (ref 6.5–8.1)

## 2020-03-28 LAB — CBC
HCT: 34.7 % — ABNORMAL LOW (ref 39.0–52.0)
Hemoglobin: 10.6 g/dL — ABNORMAL LOW (ref 13.0–17.0)
MCH: 28.1 pg (ref 26.0–34.0)
MCHC: 30.5 g/dL (ref 30.0–36.0)
MCV: 92 fL (ref 80.0–100.0)
Platelets: 319 10*3/uL (ref 150–400)
RBC: 3.77 MIL/uL — ABNORMAL LOW (ref 4.22–5.81)
RDW: 18 % — ABNORMAL HIGH (ref 11.5–15.5)
WBC: 10.8 10*3/uL — ABNORMAL HIGH (ref 4.0–10.5)
nRBC: 0 % (ref 0.0–0.2)

## 2020-03-28 LAB — LIPASE, BLOOD: Lipase: 51 U/L (ref 11–51)

## 2020-03-28 MED ORDER — MORPHINE SULFATE (PF) 2 MG/ML IV SOLN: 2.0000 mg | INTRAVENOUS | Status: DC | PRN

## 2020-03-28 MED ORDER — IOHEXOL 300 MG/ML  SOLN
100.0000 mL | Freq: Once | INTRAMUSCULAR | Status: AC | PRN
  Administered 2020-03-28: 19:00:00 100 mL via INTRAVENOUS

## 2020-03-28 MED ORDER — OXYCODONE-ACETAMINOPHEN 5-325 MG PO TABS
1.0000 | ORAL_TABLET | Freq: Once | ORAL | Status: AC
  Administered 2020-03-28: 1 via ORAL
  Filled 2020-03-28: qty 1

## 2020-03-28 MED ORDER — ONDANSETRON HCL 4 MG/2ML IJ SOLN: 4.0000 mg | Freq: Four times a day (QID) | INTRAMUSCULAR | Status: DC | PRN

## 2020-03-28 MED ORDER — SODIUM CHLORIDE 0.9 % IV BOLUS
500.0000 mL | Freq: Once | INTRAVENOUS | Status: AC
  Administered 2020-03-28: 500 mL via INTRAVENOUS

## 2020-03-28 MED ORDER — SODIUM CHLORIDE 0.9 % IV SOLN: INTRAVENOUS | Status: DC

## 2020-03-28 MED ORDER — ENOXAPARIN SODIUM 40 MG/0.4ML ~~LOC~~ SOLN
40.0000 mg | SUBCUTANEOUS | Status: DC
  Administered 2020-03-28 – 2020-03-29 (×2): 40 mg via SUBCUTANEOUS
  Filled 2020-03-28 (×2): qty 0.4

## 2020-03-28 MED ORDER — ONDANSETRON HCL 4 MG PO TABS: 4.0000 mg | ORAL_TABLET | Freq: Four times a day (QID) | ORAL | Status: DC | PRN

## 2020-03-28 MED ORDER — HYDRALAZINE HCL 20 MG/ML IJ SOLN: 10.0000 mg | Freq: Three times a day (TID) | INTRAMUSCULAR | Status: DC | PRN

## 2020-03-28 NOTE — ED Triage Notes (Signed)
Patient c/o abdominal pain, fatigue, and not being able to eat due to no appetite. Patient stated, "I haven't eaten in 3 days. Patient denies n/v/D. Patient reports a history of stomach cancer.

## 2020-03-28 NOTE — Plan of Care (Signed)
°  Problem: Education: Goal: Knowledge of General Education information will improve Description: Including pain rating scale, medication(s)/side effects and non-pharmacologic comfort measures Outcome: Progressing   Problem: Clinical Measurements: Goal: Will remain free from infection Outcome: Progressing   Problem: Nutrition: Goal: Adequate nutrition will be maintained Outcome: Progressing   Problem: Pain Managment: Goal: General experience of comfort will improve Outcome: Progressing   Problem: Safety: Goal: Ability to remain free from injury will improve Outcome: Progressing   

## 2020-03-28 NOTE — Progress Notes (Signed)
MD Edmon from radiology called to notify about patient's CT exam, suggesting a small bowel obstruction versus ileus.

## 2020-03-28 NOTE — H&P (Signed)
History and Physical    Gabriel Poole ZSW:[id number] DOB: [birth year] DOA: 03/28/2020  PCP: Wannetta Sender, FNP  Patient coming from: Home  Chief Complaint: N/V, epigastric pain.  HPI: Gabriel Poole is a 74 y.o. male with medical history significant of gastric cancer. Follows with Dr. Burr Medico. Presenting to her clinic yesterday with 2 weeks of N/V, poor appetite, and epigastric pain. He tried changing his diet, but it did not help. He tried adding nausea and constipation medicines, but this did not help. He tried increasing his pain medicine, but this did not help. He presented to his oncologist yesterday and reported that he had not had a BM in 1 week. She recommended that he come to [facility] for evaluation. Admission orders we placed. However, he decided to leave AMA. He went home and his symptoms continued. He spoke with his oncologist again today and decided to come back to the ED.   ED Course: KUB was negative. EDP discussed case with Onco. Pt agreed to stay for admission. TRH called for admission.   Review of Systems:  Denies CP, dyspnea, palpitations, diarrhea, fevers, sick contacts. Review of systems is otherwise negative for all not mentioned in HPI.   PMHx Past Medical History:  Diagnosis Date  . Arthritis   . Cancer [facility]) dx oct 02-2017   stomach  . Gout   . Heart murmur   . Hyperlipidemia   . Hypertension   . Stomach cancer [facility])     PSHx Past Surgical History:  Procedure Laterality Date  . BIOPSY  11/02/2019   Procedure: BIOPSY;  Surgeon: Rogene Houston, MD;  [facility];  Service: Endoscopy;;  gastric  . COLONOSCOPY    . COLONOSCOPY N/A 11/02/2019   Procedure: COLONOSCOPY;  Surgeon: Rogene Houston, MD;  [facility];  Service: Endoscopy;  Laterality: N/A;  100  . ESOPHAGOGASTRODUODENOSCOPY N/A 02/17/2017   Procedure: ESOPHAGOGASTRODUODENOSCOPY (EGD);  Surgeon: Rogene Houston, MD;  [facility];  Service: Endoscopy;   Laterality: N/A;  3:00  . ESOPHAGOGASTRODUODENOSCOPY N/A 11/02/2019   Procedure: ESOPHAGOGASTRODUODENOSCOPY (EGD);  Surgeon: Rogene Houston, MD;  [facility];  Service: Endoscopy;  Laterality: N/A;  . EUS N/A 03/11/2017   Procedure: UPPER ENDOSCOPIC ULTRASOUND (EUS) LINEAR;  Surgeon: Milus Banister, MD;  [facility];  Service: Endoscopy;  Laterality: N/A;  . EUS N/A 03/11/2017   Procedure: UPPER ENDOSCOPIC ULTRASOUND (EUS) RADIAL;  Surgeon: Milus Banister, MD;  [facility];  Service: Endoscopy;  Laterality: N/A;  . FINGER SURGERY     Lt middle   . GASTRECTOMY N/A 04/22/2017   Procedure: PARTIAL GASTRECTOMY;  Surgeon: Stark Klein, MD;  Location: [place];  Service: General;  Laterality: N/A;  . GASTROJEJUNOSTOMY N/A 04/22/2017   Procedure: JEJUNAL FEEDING TUBE PLACEMENT;  Surgeon: Stark Klein, MD;  Location: [place];  Service: General;  Laterality: N/A;  . HYDROCELE EXCISION  02/12/2011   Procedure: HYDROCELECTOMY ADULT;  Surgeon: Marissa Nestle;  [facility];  Service: Urology;  Laterality: Left;  . LAPAROSCOPY N/A 04/22/2017   Procedure: LAPAROSCOPY DIAGNOSTIC ERAS PATHWAY;  Surgeon: Stark Klein, MD;  Location: [place];  Service: General;  Laterality: N/A;  EPIDURAL  . PORTACATH PLACEMENT N/A 05/28/2017   Procedure: INSERTION PORT-A-CATH ERAS PATHWAY;  Surgeon: Stark Klein, MD;  Location: [place];  Service: General;  Laterality: N/A;    SocHx  reports that he quit smoking about 2 years ago.  His smoking use included cigarettes and cigars. He has a 22.50 pack-year smoking history. He has never used smokeless tobacco. He reports previous alcohol use. He reports that he does not use drugs.  Allergies  Allergen Reactions  . Penicillins Itching and Other (See Comments)     patient had a PCN reaction causing immediate rash, facial/tongue/throat swelling, SOB or lightheadedness with hypotension: Unknown Has patient had a PCN reaction  causing severe rash involving mucus membranes or skin necrosis: Unknown Has patient had a PCN reaction that required hospitalization: Unknown Has patient had a PCN reaction occurring within the last 10 years: No If all of the above answers are "NO", then may proceed with Cephalosporin use. Teenager*     FamHx Family History  Problem Relation Age of Onset  . Hypertension Mother   . Heart attack Father   . Cancer Maternal Aunt        leukemia  . Cancer Maternal Aunt        leukemia  . Hypotension Neg Hx   . Anesthesia problems Neg Hx   . Malignant hyperthermia Neg Hx   . Pseudochol deficiency Neg Hx     Prior to Admission medications   Medication Sig Start Date End Date Taking? Authorizing Provider  amLODipine (NORVASC) 10 MG tablet Take 1 tablet (10 mg total) by mouth daily. 12/04/15   Claretta Fraise, MD  atorvastatin (LIPITOR) 40 MG tablet Take 1 tablet (40 mg total) by mouth daily at 6 PM. 05/08/15   Dettinger, Fransisca Kaufmann, MD  Budesonide ER 9 MG CP24 Take 9 mg by mouth daily. 03/23/20   Truitt Merle, MD  Cholecalciferol (VITAMIN D3) 5000 units CAPS Take 5,000 Units by mouth daily.    [provider]  finasteride (PROSCAR) 5 MG tablet Take 5 mg by mouth daily. 10/24/19   [provider]  HYDROcodone-acetaminophen (NORCO/VICODIN) 5-325 MG tablet TAKE 1 TABLET EVERY 6 HOURS AS NEEDED FOR MODERATE PAIN 03/12/20   Alla Feeling, NP  HYDROcodone-acetaminophen (NORCO/VICODIN) 5-325 MG tablet Take 1 tablet by mouth every 6 (six) hours as needed for moderate pain. 03/06/20   Truitt Merle, MD  hydrOXYzine (ATARAX/VISTARIL) 50 MG tablet Take 50 mg by mouth every 6 (six) hours as needed for anxiety. 01/12/20   [provider]  levofloxacin (LEVAQUIN) 750 MG tablet Take 1 tablet (750 mg total) by mouth daily. 03/23/20   Truitt Merle, MD  lidocaine-prilocaine (EMLA) cream Apply to affected area once 11/24/19   Truitt Merle, MD  megestrol (MEGACE ES) 625 MG/5ML suspension Take 5 mLs  (625 mg total) by mouth daily. 01/24/20   Alla Feeling, NP  ondansetron (ZOFRAN) 8 MG tablet Take 1 tablet (8 mg total) by mouth 2 (two) times daily as needed for refractory nausea / vomiting. Start on day 3 after chemotherapy. 03/06/20   Truitt Merle, MD  pantoprazole (PROTONIX) 40 MG tablet Take 1 tablet (40 mg total) by mouth daily. 03/06/20   Truitt Merle, MD  potassium chloride SA (K-DUR,KLOR-CON) 20 MEQ tablet Take 1 tablet (20 mEq total) by mouth 2 (two) times daily. 08/18/17   Truitt Merle, MD  prochlorperazine (COMPAZINE) 10 MG tablet Take 1 tablet (10 mg total) by mouth every 6 (six) hours as needed (Nausea or vomiting). 02/07/20   Truitt Merle, MD  tadalafil (CIALIS) 5 MG tablet Take 5 mg by mouth daily. 09/28/19   [provider]  tamsulosin (FLOMAX) 0.4 MG CAPS capsule Take 0.4 mg by mouth daily. 11/16/19   [provider]  triamterene-hydrochlorothiazide (MAXZIDE-25) 37.5-25 MG tablet Take 1 tablet by mouth daily.    [provider]    Physical Exam: Vitals:   03/28/20 1405 03/28/20 1415 03/28/20 1527  BP: (!) 148/85  (!) 163/76  Pulse: 85  (!) 55  Resp: 16  20  Temp: 99.5 F (37.5 C)    TempSrc: Oral    SpO2: 98%  100%  Weight:  52.2 kg   Height:  5\' 10"  (1.778 m)     General: 74 y.o. ill appearing male resting in bed in NAD Eyes: PERRL, normal sclera ENMT: Nares patent w/o discharge, orophaynx clear, dentition normal, ears w/o discharge/lesions/ulcers Neck: trachea midline Cardiovascular: RRR, +S1, S2, no m/g/r, equal pulses throughout Respiratory: CTABL, no w/r/r, normal WOB GI: BS+, ND, TTP epigastric/LUQ, no masses noted, no organomegaly noted MSK: No e/c/c Skin: No rashes, bruises, ulcerations noted Neuro: A&O x 3, no focal deficits Psyc: Appropriate interaction and affect, calm/cooperative  Labs on Admission: I have personally reviewed following labs and imaging studies  CBC: Recent Labs  Lab 03/22/20 1219 03/27/20 1115 03/28/20 1419  WBC  6.7 6.6 10.8*  NEUTROABS 4.7 4.5  --   HGB 9.6* 9.4* 10.6*  HCT 30.3* 30.2* 34.7*  MCV 86.6 88.6 92.0  PLT 307 289 330   Basic Metabolic Panel: Recent Labs  Lab 03/22/20 1219 03/27/20 1115 03/28/20 1419  NA 136 139 141  K 4.4 3.8 4.2  CL 103 106 108  CO2 25 26 22   GLUCOSE 80 84 107*  BUN 27* 43* 40*  CREATININE 2.20* 1.53* 1.21  CALCIUM 9.1 8.8* 9.2   GFR: Estimated Creatinine Clearance: 40.1 mL/min (by C-G formula based on SCr of 1.21 mg/dL). Liver Function Tests: Recent Labs  Lab 03/22/20 1219 03/27/20 1115 03/28/20 1419  AST 14* 21 27  ALT 6 8 17   ALKPHOS 85 76 89  BILITOT 0.9 0.6 0.8  PROT 5.6* 5.5* 5.8*  ALBUMIN 2.9* 2.9* 3.3*   Recent Labs  Lab 03/28/20 1419  LIPASE 51   No results for input(s): AMMONIA in the last 168 hours. Coagulation Profile: No results for input(s): INR, PROTIME in the last 168 hours. Cardiac Enzymes: No results for input(s): CKTOTAL, CKMB, CKMBINDEX, TROPONINI in the last 168 hours. BNP (last 3 results) No results for input(s): PROBNP in the last 8760 hours. HbA1C: No results for input(s): HGBA1C in the last 72 hours. CBG: No results for input(s): GLUCAP in the last 168 hours. Lipid Profile: No results for input(s): CHOL, HDL, LDLCALC, TRIG, CHOLHDL, LDLDIRECT in the last 72 hours. Thyroid Function Tests: No results for input(s): TSH, T4TOTAL, FREET4, T3FREE, THYROIDAB in the last 72 hours. Anemia Panel: No results for input(s): VITAMINB12, FOLATE, FERRITIN, TIBC, IRON, RETICCTPCT in the last 72 hours. Urine analysis:    Component Value Date/Time   COLORURINE BROWN (A) 11/09/2017 1626   APPEARANCEUR HAZY (A) 11/09/2017 1626   LABSPEC 1.025 11/09/2017 1626   PHURINE 6.5 11/09/2017 1626   GLUCOSEU NEGATIVE 11/09/2017 1626   HGBUR LARGE (A) 11/09/2017 1626   BILIRUBINUR SMALL (A) 11/09/2017 1626   BILIRUBINUR color interference 03/07/2015 1309   KETONESUR TRACE (A) 11/09/2017 1626   PROTEINUR 100 (A) 11/09/2017 1626    UROBILINOGEN 0.2 12/17/2010 1744   NITRITE NEGATIVE 11/09/2017 1626   LEUKOCYTESUR TRACE (A) 11/09/2017 1626    Radiological Exams on Admission: No results found.  Assessment/Plan Intractable N/V Epigastric pain Hx of gastric CA     - admit to obs, med-surg     -  IV fluids     - bowel rest     - KUB w/ no acute process; CT ab/pelvis w/ contrast ordered in ED     - Onco to follow, I have consulted GI, awaiting recs  Hx of gastric cancer     - last chemo 10/27; onco following  Normocytic anemia     - no evidence of bleed, follow  HTN     - NPO for now; manage w/ PRNs  HLD     - NPO for now, holding statin  BPH     - NPO for now, holding home regimen  DVT prophylaxis: lovenox  Code Status: FULL  Family Communication: With dtr at bedside  Consults called: EDP spoke with onco who will follow; [facility] (Dr. Tarri Glenn) contacted by secure chat   Status is: Observation  The patient remains OBS appropriate and will d/c before 2 midnights.  Dispo: The patient is from: Home              Anticipated d/c is to: Home              Anticipated d/c date is: 1 day              Patient currently is not medically stable to d/c.  Jonnie Finner DO [facility]  If 7PM-7AM, please contact night-coverage [email or web address]  03/28/2020, 3:50 PM

## 2020-03-28 NOTE — ED Provider Notes (Signed)
[place] DEPT Provider Note   CSN: [id number] Arrival date & time: 03/28/20  1359     History Chief Complaint  Patient presents with  . Fatigue  . Abdominal Pain    Gabriel Poole is a 74 y.o. male.  74 year old male with prior medical history detailed below presents for evaluation.  Patient with longstanding history of gastric cancer.  Patient is followed by oncology - Dr Burr Medico.  Patient was scheduled for direct admission yesterday for work-up by GI.  Patient with abdominal pain, nausea, and vomiting.  Patient apparently left AMA yesterday.  He returns today to the ED.  He is now interested and agreeable with plan for admission.  Patient denies fever.  Patient reports generalized pain.  Patient's pain today is unchanged from the last several days.  The history is provided by the patient and medical records.  Abdominal Pain Pain location:  Generalized Pain quality: aching   Pain radiates to:  Does not radiate Pain severity:  Moderate Onset quality:  Gradual Duration:  2 weeks Timing:  Constant Chronicity:  Recurrent      Past Medical History:  Diagnosis Date  . Arthritis   . Cancer [facility]) dx oct 02-2017   stomach  . Gout   . Heart murmur   . Hyperlipidemia   . Hypertension   . Stomach cancer [facility])     Patient Active Problem List   Diagnosis Date Noted  . Abdominal pain 03/28/2020  . Goals of care, counseling/discussion 11/24/2019  . Iron deficiency anemia due to chronic blood loss 11/22/2019  . 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 Port-A-Cath in place 06/02/2017  . Malignant neoplasm of body of stomach ([place])   . Abdominal pain, epigastric 02/01/2017  . 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 Pain of right lower leg 11/29/2014  . Elevated PSA, less than 10 ng/ml 10/31/2014  . Noncompliance 09/21/2012  . HTN (hypertension) 09/21/2012  . HLD (hyperlipidemia) 09/21/2012  .  Gout 09/21/2012  . Hydrocele 12/17/2010  . Tobacco abuse 12/17/2010    Past Surgical History:  Procedure Laterality Date  . BIOPSY  11/02/2019   Procedure: BIOPSY;  Surgeon: Rogene Houston, MD;  [facility];  Service: Endoscopy;;  gastric  . COLONOSCOPY    . COLONOSCOPY N/A 11/02/2019   Procedure: COLONOSCOPY;  Surgeon: Rogene Houston, MD;  [facility];  Service: Endoscopy;  Laterality: N/A;  100  . ESOPHAGOGASTRODUODENOSCOPY N/A 02/17/2017   Procedure: ESOPHAGOGASTRODUODENOSCOPY (EGD);  Surgeon: Rogene Houston, MD;  [facility];  Service: Endoscopy;  Laterality: N/A;  3:00  . ESOPHAGOGASTRODUODENOSCOPY N/A 11/02/2019   Procedure: ESOPHAGOGASTRODUODENOSCOPY (EGD);  Surgeon: Rogene Houston, MD;  [facility];  Service: Endoscopy;  Laterality: N/A;  . EUS N/A 03/11/2017   Procedure: UPPER ENDOSCOPIC ULTRASOUND (EUS) LINEAR;  Surgeon: Milus Banister, MD;  [facility];  Service: Endoscopy;  Laterality: N/A;  . EUS N/A 03/11/2017   Procedure: UPPER ENDOSCOPIC ULTRASOUND (EUS) RADIAL;  Surgeon: Milus Banister, MD;  [facility];  Service: Endoscopy;  Laterality: N/A;  . FINGER SURGERY     Lt middle   . GASTRECTOMY N/A 04/22/2017   Procedure: PARTIAL GASTRECTOMY;  Surgeon: Stark Klein, MD;  Location: [place];  Service: General;  Laterality: N/A;  . GASTROJEJUNOSTOMY N/A 04/22/2017   Procedure: JEJUNAL FEEDING TUBE PLACEMENT;  Surgeon: Stark Klein, MD;  Location: [place];  Service: General;  Laterality: N/A;  . HYDROCELE EXCISION  02/12/2011   Procedure: HYDROCELECTOMY ADULT;  Surgeon: Marissa Nestle;  [facility];  Service: Urology;  Laterality: Left;  . LAPAROSCOPY N/A 04/22/2017   Procedure: LAPAROSCOPY DIAGNOSTIC ERAS PATHWAY;  Surgeon: Stark Klein, MD;  Location: [place];  Service: General;  Laterality: N/A;  EPIDURAL  . PORTACATH PLACEMENT N/A 05/28/2017   Procedure: INSERTION PORT-A-CATH ERAS PATHWAY;   Surgeon: Stark Klein, MD;  Location: [place];  Service: General;  Laterality: N/A;       Family History  Problem Relation Age of Onset  . Hypertension Mother   . Heart attack Father   . Cancer Maternal Aunt        leukemia  . Cancer Maternal Aunt        leukemia  . Hypotension Neg Hx   . Anesthesia problems Neg Hx   . Malignant hyperthermia Neg Hx   . Pseudochol deficiency Neg Hx     Social History   Tobacco Use  . Smoking status: Former Smoker    Packs/day: 0.50    Years: 45.00    Pack years: 22.50    Types: Cigarettes, Cigars    Quit date: 04/2017    Years since quitting: 2.9  . Smokeless tobacco: Never Used  . Tobacco comment: used to smoke 1 pack a day   Vaping Use  . Vaping Use: Never used  Substance Use Topics  . Alcohol use: Not Currently    Alcohol/week: 0.0 standard drinks    Comment: Brandy daily, stopped in Dec 2018  . Drug use: No    Home Medications Prior to Admission medications   Medication Sig Start Date End Date Taking? Authorizing Provider  amLODipine (NORVASC) 10 MG tablet Take 1 tablet (10 mg total) by mouth daily. 12/04/15   Claretta Fraise, MD  atorvastatin (LIPITOR) 40 MG tablet Take 1 tablet (40 mg total) by mouth daily at 6 PM. 05/08/15   Dettinger, Fransisca Kaufmann, MD  Budesonide ER 9 MG CP24 Take 9 mg by mouth daily. 03/23/20   Truitt Merle, MD  Cholecalciferol (VITAMIN D3) 5000 units CAPS Take 5,000 Units by mouth daily.    [provider]  finasteride (PROSCAR) 5 MG tablet Take 5 mg by mouth daily. 10/24/19   [provider]  HYDROcodone-acetaminophen (NORCO/VICODIN) 5-325 MG tablet TAKE 1 TABLET EVERY 6 HOURS AS NEEDED FOR MODERATE PAIN 03/12/20   Alla Feeling, NP  HYDROcodone-acetaminophen (NORCO/VICODIN) 5-325 MG tablet Take 1 tablet by mouth every 6 (six) hours as needed for moderate pain. 03/06/20   Truitt Merle, MD  hydrOXYzine (ATARAX/VISTARIL) 50 MG tablet Take 50 mg by mouth every 6 (six) hours as needed  for anxiety. 01/12/20   [provider]  levofloxacin (LEVAQUIN) 750 MG tablet Take 1 tablet (750 mg total) by mouth daily. 03/23/20   Truitt Merle, MD  lidocaine-prilocaine (EMLA) cream Apply to affected area once 11/24/19   Truitt Merle, MD  megestrol (MEGACE ES) 625 MG/5ML suspension Take 5 mLs (625 mg total) by mouth daily. 01/24/20   Alla Feeling, NP  ondansetron (ZOFRAN) 8 MG tablet Take 1 tablet (8 mg total) by mouth 2 (two) times daily as needed for refractory nausea / vomiting. Start on day 3 after chemotherapy. 03/06/20   Truitt Merle, MD  pantoprazole (PROTONIX) 40 MG tablet Take 1 tablet (40 mg total) by mouth daily. 03/06/20   Truitt Merle, MD  potassium chloride SA (K-DUR,KLOR-CON) 20 MEQ tablet Take 1 tablet (  20 mEq total) by mouth 2 (two) times daily. 08/18/17   Truitt Merle, MD  prochlorperazine (COMPAZINE) 10 MG tablet Take 1 tablet (10 mg total) by mouth every 6 (six) hours as needed (Nausea or vomiting). 02/07/20   Truitt Merle, MD  tadalafil (CIALIS) 5 MG tablet Take 5 mg by mouth daily. 09/28/19   [provider]  tamsulosin (FLOMAX) 0.4 MG CAPS capsule Take 0.4 mg by mouth daily. 11/16/19   [provider]  triamterene-hydrochlorothiazide (MAXZIDE-25) 37.5-25 MG tablet Take 1 tablet by mouth daily.    [provider]    Allergies    Penicillins  Review of Systems   Review of Systems  Gastrointestinal: Positive for abdominal pain.  All other systems reviewed and are negative.   Physical Exam Updated Vital Signs BP (!) 163/76   Pulse (!) 55   Temp 99.5 F (37.5 C) (Oral)   Resp 20   Ht 5\' 10"  (1.778 m)   Wt 52.2 kg   SpO2 100%   BMI 16.50 kg/m   Physical Exam Vitals and nursing note reviewed.  Constitutional:      General: He is not in acute distress.    Appearance: He is well-developed.  HENT:     Head: Normocephalic and atraumatic.  Eyes:     Conjunctiva/sclera: Conjunctivae normal.     Pupils: Pupils are equal, round, and reactive to  light.  Cardiovascular:     Rate and Rhythm: Normal rate and regular rhythm.     Heart sounds: Normal heart sounds.  Pulmonary:     Effort: Pulmonary effort is normal. No respiratory distress.     Breath sounds: Normal breath sounds.  Abdominal:     General: There is no distension.     Palpations: Abdomen is soft.     Tenderness: There is abdominal tenderness.     Comments: Mild diffuse tenderness to the abdomen with palpation.  No rebound noted.  No guarding noted.  Musculoskeletal:        General: No deformity. Normal range of motion.     Cervical back: Normal range of motion and neck supple.  Skin:    General: Skin is warm and dry.  Neurological:     Mental Status: He is alert and oriented to person, place, and time.     ED Results / Procedures / Treatments   Labs (all labs ordered are listed, but only abnormal results are displayed) Labs Reviewed  COMPREHENSIVE METABOLIC PANEL - Abnormal; Notable for the following components:      Result Value   Glucose, Bld 107 (*)    BUN 40 (*)    Total Protein 5.8 (*)    Albumin 3.3 (*)    All other components within normal limits  CBC - Abnormal; Notable for the following components:   WBC 10.8 (*)    RBC 3.77 (*)    Hemoglobin 10.6 (*)    HCT 34.7 (*)    RDW 18.0 (*)    All other components within normal limits  LIPASE, BLOOD  URINALYSIS, ROUTINE W REFLEX MICROSCOPIC    EKG None  Radiology No results found.  Procedures Procedures (including critical care time)  Medications Ordered in ED Medications  oxyCODONE-acetaminophen (PERCOCET/ROXICET) 5-325 MG per tablet 1 tablet (1 tablet Oral Given 03/28/20 1528)  sodium chloride 0.9 % bolus 500 mL (500 mLs Intravenous New Bag/Given 03/28/20 1529)    ED Course  I have reviewed the triage vital signs and the nursing notes.  Pertinent  labs & imaging results that were available during my care of the patient were reviewed by me and considered in my medical decision making  (see chart for details).    MDM Rules/Calculators/A&P                          MDM  Screen complete  Gabriel Poole was evaluated in Emergency Department on 03/28/2020 for the symptoms described in the history of present illness. He was evaluated in the context of the global COVID-19 pandemic, which necessitated consideration that the patient might be at risk for infection with the SARS-CoV-2 virus that causes COVID-19. Institutional protocols and algorithms that pertain to the evaluation of patients at risk for COVID-19 are in a state of rapid change based on information released by regulatory bodies including the CDC and federal and state organizations. These policies and algorithms were followed during the patient's care in the ED.  Patient is presenting for admission.  Patient apparently was to be admitted yesterday for further work-up of his abdominal pain in the setting of gastric cancer.  Patient apparently left AMA.  Patient refers to the ED now.  He is agreeable with plan for admission.  Hospitalist service Marylyn Ishihara) is aware of case and will evaluate for admission.  Dr. Burr Medico of Oncology is aware case and will follow as consult.   Final Clinical Impression(s) / ED Diagnoses Final diagnoses:  Abdominal pain, unspecified abdominal location    Rx / DC Orders ED Discharge Orders    None       Valarie Merino, MD 03/28/20 1538

## 2020-03-28 NOTE — ED Notes (Signed)
hospitalist in room  

## 2020-03-29 ENCOUNTER — Observation Stay (HOSPITAL_COMMUNITY): Payer: Medicare HMO

## 2020-03-29 ENCOUNTER — Encounter (HOSPITAL_COMMUNITY)
Admission: EM | Disposition: A | Discharge status: Home / Self Care | Payer: Self-pay | Source: Home / Self Care | Attending: Internal Medicine

## 2020-03-29 ENCOUNTER — Encounter (HOSPITAL_COMMUNITY): Payer: Self-pay | Admitting: Internal Medicine

## 2020-03-29 ENCOUNTER — Observation Stay (HOSPITAL_COMMUNITY): Payer: Medicare HMO | Admitting: Anesthesiology

## 2020-03-29 ENCOUNTER — Inpatient Hospital Stay (HOSPITAL_COMMUNITY): Payer: Medicare HMO

## 2020-03-29 DIAGNOSIS — R109 Unspecified abdominal pain: Secondary | ICD-10-CM

## 2020-03-29 DIAGNOSIS — I10 Essential (primary) hypertension: Secondary | ICD-10-CM | POA: Diagnosis present

## 2020-03-29 DIAGNOSIS — R1110 Vomiting, unspecified: Secondary | ICD-10-CM

## 2020-03-29 DIAGNOSIS — Z79899 Other long term (current) drug therapy: Secondary | ICD-10-CM | POA: Diagnosis not present

## 2020-03-29 DIAGNOSIS — K56609 Unspecified intestinal obstruction, unspecified as to partial versus complete obstruction: Secondary | ICD-10-CM

## 2020-03-29 DIAGNOSIS — Z9884 Bariatric surgery status: Secondary | ICD-10-CM | POA: Diagnosis not present

## 2020-03-29 DIAGNOSIS — C162 Malignant neoplasm of body of stomach: Secondary | ICD-10-CM | POA: Diagnosis present

## 2020-03-29 DIAGNOSIS — K2100 Gastro-esophageal reflux disease with esophagitis, without bleeding: Secondary | ICD-10-CM | POA: Diagnosis present

## 2020-03-29 DIAGNOSIS — Z880 Allergy status to penicillin: Secondary | ICD-10-CM | POA: Diagnosis not present

## 2020-03-29 DIAGNOSIS — N400 Enlarged prostate without lower urinary tract symptoms: Secondary | ICD-10-CM | POA: Diagnosis present

## 2020-03-29 DIAGNOSIS — R1010 Upper abdominal pain, unspecified: Secondary | ICD-10-CM | POA: Diagnosis not present

## 2020-03-29 DIAGNOSIS — Z87891 Personal history of nicotine dependence: Secondary | ICD-10-CM | POA: Diagnosis not present

## 2020-03-29 DIAGNOSIS — K567 Ileus, unspecified: Secondary | ICD-10-CM | POA: Diagnosis present

## 2020-03-29 DIAGNOSIS — K9423 Gastrostomy malfunction: Secondary | ICD-10-CM | POA: Diagnosis present

## 2020-03-29 DIAGNOSIS — T182XXA Foreign body in stomach, initial encounter: Secondary | ICD-10-CM | POA: Diagnosis present

## 2020-03-29 DIAGNOSIS — K208 Other esophagitis without bleeding: Secondary | ICD-10-CM

## 2020-03-29 DIAGNOSIS — K2210 Ulcer of esophagus without bleeding: Secondary | ICD-10-CM | POA: Diagnosis present

## 2020-03-29 DIAGNOSIS — Z20822 Contact with and (suspected) exposure to covid-19: Secondary | ICD-10-CM | POA: Diagnosis present

## 2020-03-29 DIAGNOSIS — D630 Anemia in neoplastic disease: Secondary | ICD-10-CM | POA: Diagnosis present

## 2020-03-29 DIAGNOSIS — R64 Cachexia: Secondary | ICD-10-CM | POA: Diagnosis present

## 2020-03-29 DIAGNOSIS — E43 Unspecified severe protein-calorie malnutrition: Secondary | ICD-10-CM | POA: Diagnosis present

## 2020-03-29 DIAGNOSIS — R112 Nausea with vomiting, unspecified: Secondary | ICD-10-CM | POA: Diagnosis not present

## 2020-03-29 DIAGNOSIS — E785 Hyperlipidemia, unspecified: Secondary | ICD-10-CM | POA: Diagnosis present

## 2020-03-29 DIAGNOSIS — Z7189 Other specified counseling: Secondary | ICD-10-CM | POA: Diagnosis not present

## 2020-03-29 DIAGNOSIS — C787 Secondary malignant neoplasm of liver and intrahepatic bile duct: Secondary | ICD-10-CM | POA: Diagnosis present

## 2020-03-29 DIAGNOSIS — X58XXXA Exposure to other specified factors, initial encounter: Secondary | ICD-10-CM | POA: Diagnosis present

## 2020-03-29 DIAGNOSIS — Z681 Body mass index (BMI) 19 or less, adult: Secondary | ICD-10-CM | POA: Diagnosis not present

## 2020-03-29 HISTORY — PX: FOREIGN BODY REMOVAL: SHX962

## 2020-03-29 HISTORY — PX: ESOPHAGOGASTRODUODENOSCOPY (EGD) WITH PROPOFOL: SHX5813

## 2020-03-29 LAB — CBC
HCT: 27.9 % — ABNORMAL LOW (ref 39.0–52.0)
Hemoglobin: 8.6 g/dL — ABNORMAL LOW (ref 13.0–17.0)
MCH: 28.4 pg (ref 26.0–34.0)
MCHC: 30.8 g/dL (ref 30.0–36.0)
MCV: 92.1 fL (ref 80.0–100.0)
Platelets: 286 10*3/uL (ref 150–400)
RBC: 3.03 MIL/uL — ABNORMAL LOW (ref 4.22–5.81)
RDW: 18.4 % — ABNORMAL HIGH (ref 11.5–15.5)
WBC: 8 10*3/uL (ref 4.0–10.5)
nRBC: 0 % (ref 0.0–0.2)

## 2020-03-29 LAB — COMPREHENSIVE METABOLIC PANEL
ALT: 13 U/L (ref 0–44)
AST: 18 U/L (ref 15–41)
Albumin: 2.7 g/dL — ABNORMAL LOW (ref 3.5–5.0)
Alkaline Phosphatase: 72 U/L (ref 38–126)
Anion gap: 6 (ref 5–15)
BUN: 38 mg/dL — ABNORMAL HIGH (ref 8–23)
CO2: 20 mmol/L — ABNORMAL LOW (ref 22–32)
Calcium: 8.4 mg/dL — ABNORMAL LOW (ref 8.9–10.3)
Chloride: 111 mmol/L (ref 98–111)
Creatinine, Ser: 1.11 mg/dL (ref 0.61–1.24)
GFR, Estimated: >60 mL/min (ref >60)
Glucose, Bld: 82 mg/dL (ref 70–99)
Potassium: 3.8 mmol/L (ref 3.5–5.1)
Sodium: 137 mmol/L (ref 135–145)
Total Bilirubin: 0.8 mg/dL (ref 0.3–1.2)
Total Protein: 5 g/dL — ABNORMAL LOW (ref 6.5–8.1)

## 2020-03-29 SURGERY — ESOPHAGOGASTRODUODENOSCOPY (EGD) WITH PROPOFOL
Anesthesia: Monitor Anesthesia Care

## 2020-03-29 MED ORDER — PROSOURCE PLUS PO LIQD
30.0000 mL | Freq: Three times a day (TID) | ORAL | Status: DC
  Administered 2020-03-29 – 2020-03-30 (×2): 30 mL via ORAL
  Filled 2020-03-29 (×2): qty 30

## 2020-03-29 MED ORDER — SORBITOL 70 % SOLN
960.0000 mL | TOPICAL_OIL | Freq: Once | ORAL | Status: AC
  Administered 2020-03-29: 960 mL via RECTAL
  Filled 2020-03-29: qty 473

## 2020-03-29 MED ORDER — LIDOCAINE HCL (CARDIAC) PF 100 MG/5ML IV SOSY
PREFILLED_SYRINGE | INTRAVENOUS | Status: DC | PRN
  Administered 2020-03-29: 50 mg via INTRAVENOUS

## 2020-03-29 MED ORDER — SUCRALFATE 1 GM/10ML PO SUSP
1.0000 g | Freq: Three times a day (TID) | ORAL | Status: DC
  Administered 2020-03-29 – 2020-03-30 (×3): 1 g via ORAL
  Filled 2020-03-29 (×3): qty 10

## 2020-03-29 MED ORDER — PROPOFOL 500 MG/50ML IV EMUL
INTRAVENOUS | Status: AC
  Filled 2020-03-29: qty 50

## 2020-03-29 MED ORDER — ONDANSETRON HCL 4 MG/2ML IJ SOLN
INTRAMUSCULAR | Status: DC | PRN
  Administered 2020-03-29: 4 mg via INTRAVENOUS

## 2020-03-29 MED ORDER — PROPOFOL 500 MG/50ML IV EMUL
INTRAVENOUS | Status: DC | PRN
  Administered 2020-03-29: 40 mg via INTRAVENOUS

## 2020-03-29 MED ORDER — PROPOFOL 500 MG/50ML IV EMUL
INTRAVENOUS | Status: DC | PRN
  Administered 2020-03-29: 155 ug/kg/min via INTRAVENOUS

## 2020-03-29 MED ORDER — IOHEXOL 300 MG/ML  SOLN
150.0000 mL | Freq: Once | INTRAMUSCULAR | Status: AC | PRN
  Administered 2020-03-29: 15:00:00 110 mL via ORAL

## 2020-03-29 MED ORDER — SODIUM CHLORIDE 0.9 % IV SOLN: INTRAVENOUS | Status: DC

## 2020-03-29 MED ORDER — HYDRALAZINE HCL 20 MG/ML IJ SOLN: 10.0000 mg | Freq: Four times a day (QID) | INTRAMUSCULAR | Status: DC | PRN

## 2020-03-29 SURGICAL SUPPLY — 14 items

## 2020-03-29 NOTE — Plan of Care (Signed)
  Problem: Education: Goal: Knowledge of General Education information will improve Description: Including pain rating scale, medication(s)/side effects and non-pharmacologic comfort measures Outcome: Progressing   Problem: Safety: Goal: Ability to remain free from injury will improve Outcome: Progressing   

## 2020-03-29 NOTE — Progress Notes (Signed)
Initial Nutrition Assessment  DOCUMENTATION CODES:   Underweight  INTERVENTION:   -Prosource Plus PO TID, each provides 100 kcals and 15g protein  NUTRITION DIAGNOSIS:   Increased nutrient needs related to cancer and cancer related treatments as evidenced by estimated needs.  GOAL:   Patient will meet greater than or equal to 90% of their needs  MONITOR:   PO intake, Supplement acceptance, Labs, Weight trends, I & O's, Diet advancement  REASON FOR ASSESSMENT:   Malnutrition Screening Tool    ASSESSMENT:   74 year old male with history of gastric CA, follows with Dr. Burr Medico, hypertension, hyperlipidemia presented with 2 weeks of nausea vomiting, poor appetite and epigastric pain.  Patient tried changing his diet but did not help.  Patient reported no BM for last 1 week.  He tried adding laxatives with no improvement.  Initially, left AMA from the ED however spoke with oncologist on the day of admission and return back.  Patient not in room at time of visit, was in endoscopy. Per GI note, pt had reflux and erosive esophagitis present. Pt with history of biliroth II gastrojejunostomy.   Per chart review, pt has been having abdominal pain and N/V for 2 weeks PTA. Poor PO during this time. 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  Pt has also been constipated for about 1 week as well.  Per weight records, pt has lost 22 lbs since 6/24 (16% wt loss x 5 months, significant for time frame). Suspect some degree of malnutrition given poor PO, symptoms, cancer and underweight status.  Labs reviewed. Medications: Carafate  NUTRITION - FOCUSED PHYSICAL EXAM:  In endoscopy, will attempt at follow-up  Diet Order:   Diet Order            Diet bariatric clear liquid Room service appropriate? Yes; Fluid consistency: Thin  Diet effective now                 EDUCATION NEEDS:   No education needs have been identified at this  time  Skin:  Skin Assessment: Reviewed RN Assessment  Last BM:  11/17  Height:   Ht Readings from Last 1 Encounters:  03/29/20 5\' 10"  (1.778 m)    Weight:   Wt Readings from Last 1 Encounters:  03/29/20 52.2 kg    BMI:  Body mass index is 16.51 kg/m.  Estimated Nutritional Needs:   Kcal:  1550-1750  Protein:  75-90g  Fluid:  1.7L/day  Clayton Bibles, MS, RD, LDN Inpatient Clinical Dietitian Contact information available via Amion

## 2020-03-29 NOTE — Anesthesia Preprocedure Evaluation (Signed)
Anesthesia Evaluation  Patient identified by MRN, date of birth, ID band Patient awake    Reviewed: Allergy & Precautions, H&P , NPO status , Patient's Chart, lab work & pertinent test results, reviewed documented beta blocker date and time   Airway Mallampati: I  TM Distance: >3 FB Neck ROM: Full    Dental  (+) Partial Upper, Dental Advisory Given, Missing   Pulmonary Current Smoker and Patient abstained from smoking., former smoker,    Pulmonary exam normal breath sounds clear to auscultation       Cardiovascular hypertension, Pt. on medications and Pt. on home beta blockers  Rhythm:Regular Rate:Normal     Neuro/Psych negative psych ROS   GI/Hepatic Neg liver ROS, Stomach CA   Endo/Other  negative endocrine ROS  Renal/GU negative Renal ROS  negative genitourinary   Musculoskeletal  (+) Arthritis , Osteoarthritis,    Abdominal Normal abdominal exam  (+)   Peds  Hematology  (+) Blood dyscrasia, anemia ,   Anesthesia Other Findings   Reproductive/Obstetrics negative OB ROS                             Anesthesia Physical  Anesthesia Plan  ASA: III  Anesthesia Plan: MAC   Post-op Pain Management:    Induction:   PONV Risk Score and Plan: 2 and Ondansetron and Dexamethasone  Airway Management Planned: Natural Airway and Simple Face Mask  Additional Equipment: None  Intra-op Plan:   Post-operative Plan: Extubation in OR  Informed Consent: I have reviewed the patients History and Physical, chart, labs and discussed the procedure including the risks, benefits and alternatives for the proposed anesthesia with the patient or authorized representative who has indicated his/her understanding and acceptance.       Plan Discussed with: CRNA  Anesthesia Plan Comments:         Anesthesia Quick Evaluation

## 2020-03-29 NOTE — Consult Note (Addendum)
Referring Provider:  Dr. Marylyn Ishihara. TRH Primary Care Physician:  Wannetta Sender, FNP Primary Gastroenterologist:  Dr. Laural Golden  Reason for Consultation:  N/V, gastric cancer, SBO  HPI: Gabriel Poole is a 74 y.o. male with medical history significant of gastric cancer, follows with Dr. Burr Medico. Presenting to her clinic on 11/17 with complaints of N/V, poor appetite, and epigastric pain.  It was recommended that he come to the ED that day but he declined.  He did come yesterday then, however.    CT abdomen and pelvis with contrast showed the following:  IMPRESSION: 1. Fluid-filled stomach and proximal small bowel. Persistent distension of the fluid-filled small bowel to 4.8 cm compared to 4.6 cm on 03/15/2019. Findings consistent with small bowel obstruction versus severe ileus. No pneumatosis or portal venous gas. No intraperitoneal free air. 2. Oral contrast from 03/24/2017 remains in the LEFT colon. 3. Post partial gastrectomy with Roux-en-Y anatomy.  He tells me that this nausea and vomiting has been occurring intermittently for 2-3 months.  Says that if he eats a large meal then about an hour later he will vomit.  If he eats smaller meals then he tolerates that much better.  Also tolerates liquids alone just fine.  He admits to constipation but states that he has not been eating much.    Last EGD with Dr. Laural Golden 10/2019:  - Normal hypopharynx. - Normal esophagus. - Z-line regular, 45 cm from the incisors. - Patent Billroth II gastrojejunostomy was found. 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 Biopsied. - Gastric ulcer with raised margins appears to be separate lesion. Biopsied.  comment: recurrent tumor at anastamosis appears to be separate form fundal lesion. these lesions are the source of chronic blood loss.   Past Medical History:  Diagnosis Date  . Arthritis   . Cancer [facility]) dx oct 02-2017   stomach  . Gout   . Heart murmur   . Hyperlipidemia   .  Hypertension   . Stomach cancer [facility])     Past Surgical History:  Procedure Laterality Date  . BIOPSY  11/02/2019   Procedure: BIOPSY;  Surgeon: Rogene Houston, MD;  [facility];  Service: Endoscopy;;  gastric  . COLONOSCOPY    . COLONOSCOPY N/A 11/02/2019   Procedure: COLONOSCOPY;  Surgeon: Rogene Houston, MD;  [facility];  Service: Endoscopy;  Laterality: N/A;  100  . ESOPHAGOGASTRODUODENOSCOPY N/A 02/17/2017   Procedure: ESOPHAGOGASTRODUODENOSCOPY (EGD);  Surgeon: Rogene Houston, MD;  [facility];  Service: Endoscopy;  Laterality: N/A;  3:00  . ESOPHAGOGASTRODUODENOSCOPY N/A 11/02/2019   Procedure: ESOPHAGOGASTRODUODENOSCOPY (EGD);  Surgeon: Rogene Houston, MD;  [facility];  Service: Endoscopy;  Laterality: N/A;  . EUS N/A 03/11/2017   Procedure: UPPER ENDOSCOPIC ULTRASOUND (EUS) LINEAR;  Surgeon: Milus Banister, MD;  [facility]: [facility];  Service: Endoscopy;  Laterality: N/A;  . EUS N/A 03/11/2017   Procedure: UPPER ENDOSCOPIC ULTRASOUND (EUS) RADIAL;  Surgeon: Milus Banister, MD;  Location: [facility];  Service: Endoscopy;  Laterality: N/A;  . FINGER SURGERY     Lt middle   . GASTRECTOMY N/A 04/22/2017   Procedure: PARTIAL GASTRECTOMY;  Surgeon: Stark Klein, MD;  Location: [place];  Service: General;  Laterality: N/A;  . GASTROJEJUNOSTOMY N/A 04/22/2017   Procedure: JEJUNAL FEEDING TUBE PLACEMENT;  Surgeon: Stark Klein, MD;  Location: [place];  Service: General;  Laterality: N/A;  . HYDROCELE EXCISION  02/12/2011   Procedure:  HYDROCELECTOMY ADULT;  Surgeon: Marissa Nestle;  Location: [facility];  Service: Urology;  Laterality: Left;  . LAPAROSCOPY N/A 04/22/2017   Procedure: LAPAROSCOPY DIAGNOSTIC ERAS PATHWAY;  Surgeon: Stark Klein, MD;  Location: [place];  Service: General;  Laterality: N/A;  EPIDURAL  . PORTACATH PLACEMENT N/A 05/28/2017   Procedure: INSERTION PORT-A-CATH ERAS PATHWAY;  Surgeon: Stark Klein, MD;   Location: [place];  Service: General;  Laterality: N/A;    Prior to Admission medications   Medication Sig Start Date End Date Taking? Authorizing Provider  amLODipine (NORVASC) 10 MG tablet Take 1 tablet (10 mg total) by mouth daily. 12/04/15  Yes Claretta Fraise, MD  atorvastatin (LIPITOR) 40 MG tablet Take 1 tablet (40 mg total) by mouth daily at 6 PM. 05/08/15  Yes Dettinger, Fransisca Kaufmann, MD  Budesonide ER 9 MG CP24 Take 9 mg by mouth daily. 03/23/20  Yes Truitt Merle, MD  Cholecalciferol (VITAMIN D3) 5000 units CAPS Take 5,000 Units by mouth daily.   Yes [provider]  finasteride (PROSCAR) 5 MG tablet Take 5 mg by mouth daily. 10/24/19  Yes [provider]  HYDROcodone-acetaminophen (NORCO/VICODIN) 5-325 MG tablet TAKE 1 TABLET EVERY 6 HOURS AS NEEDED FOR MODERATE PAIN Patient taking differently: Take 1 tablet by mouth every 6 (six) hours as needed for moderate pain or severe pain.  03/12/20  Yes Alla Feeling, NP  hydrOXYzine (ATARAX/VISTARIL) 50 MG tablet Take 50 mg by mouth every 6 (six) hours as needed for anxiety. 01/12/20  Yes [provider]  levofloxacin (LEVAQUIN) 750 MG tablet Take 1 tablet (750 mg total) by mouth daily. 03/23/20  Yes Truitt Merle, MD  lidocaine-prilocaine (EMLA) cream Apply to affected area once 11/24/19  Yes Truitt Merle, MD  megestrol (MEGACE ES) 625 MG/5ML suspension Take 5 mLs (625 mg total) by mouth daily. 01/24/20  Yes Alla Feeling, NP  ondansetron (ZOFRAN) 8 MG tablet Take 1 tablet (8 mg total) by mouth 2 (two) times daily as needed for refractory nausea / vomiting. Start on day 3 after chemotherapy. 03/06/20  Yes Truitt Merle, MD  pantoprazole (PROTONIX) 40 MG tablet Take 1 tablet (40 mg total) by mouth daily. 03/06/20  Yes Truitt Merle, MD  potassium chloride SA (K-DUR,KLOR-CON) 20 MEQ tablet Take 1 tablet (20 mEq total) by mouth 2 (two) times daily. 08/18/17  Yes Truitt Merle, MD  prochlorperazine (COMPAZINE) 10 MG tablet Take 1  tablet (10 mg total) by mouth every 6 (six) hours as needed (Nausea or vomiting). 02/07/20  Yes Truitt Merle, MD  tadalafil (CIALIS) 5 MG tablet Take 5 mg by mouth daily. 09/28/19  Yes [provider]  tamsulosin (FLOMAX) 0.4 MG CAPS capsule Take 0.4 mg by mouth daily. 11/16/19  Yes [provider]  triamterene-hydrochlorothiazide (MAXZIDE-25) 37.5-25 MG tablet Take 1 tablet by mouth daily.   Yes [provider]    Current Facility-Administered Medications  Medication Dose Route Frequency Provider Last Rate Last Admin  . 0.9 %  sodium chloride infusion   Intravenous Continuous Rai, Ripudeep K, MD 100 mL/hr at 03/29/20 0835 Rate Change at 03/29/20 0835  . enoxaparin (LOVENOX) injection 40 mg  40 mg Subcutaneous Q24H Kyle, Tyrone A, DO   40 mg at 03/28/20 2120  . hydrALAZINE (APRESOLINE) injection 10 mg  10 mg Intravenous Q8H PRN Kyle, Tyrone A, DO      . morphine 2 MG/ML injection 2 mg  2 mg Intravenous Q4H PRN Marylyn Ishihara, Tyrone A, DO      .  ondansetron (ZOFRAN) tablet 4 mg  4 mg Oral Q6H PRN Marylyn Ishihara, Tyrone A, DO       Or  . ondansetron (ZOFRAN) injection 4 mg  4 mg Intravenous Q6H PRN Marylyn Ishihara, Tyrone A, DO      . sorbitol, milk of mag, mineral oil, glycerin (SMOG) enema  960 mL Rectal Once Rai, Ripudeep K, MD        Allergies as of 03/28/2020 - Review Complete 03/28/2020  Allergen Reaction Noted  . Penicillins Itching and Other (See Comments) 02/17/2017    Family History  Problem Relation Age of Onset  . Hypertension Mother   . Heart attack Father   . Cancer Maternal Aunt        leukemia  . Cancer Maternal Aunt        leukemia  . Hypotension Neg Hx   . Anesthesia problems Neg Hx   . Malignant hyperthermia Neg Hx   . Pseudochol deficiency Neg Hx     Social History   Socioeconomic History  . Marital status: Divorced    Spouse name: Not on file  . Number of children: Not on file  . Years of education: Not on file  . Highest education level: Not on file   Occupational History  . Occupation: works at [employer]  . Smoking status: Former Smoker    Packs/day: 0.50    Years: 45.00    Pack years: 22.50    Types: Cigarettes, Cigars    Quit date: 04/2017    Years since quitting: 2.9  . Smokeless tobacco: Never Used  . Tobacco comment: used to smoke 1 pack a day   Vaping Use  . Vaping Use: Never used  Substance and Sexual Activity  . Alcohol use: Not Currently    Alcohol/week: 0.0 standard drinks    Comment: Brandy daily, stopped in Dec 2018  . Drug use: No  . Sexual activity: Yes  Other Topics Concern  . Not on file  Social History Narrative  . Not on file   Social Determinants of Health   Financial Resource Strain:   . Difficulty of Paying Living Expenses: Not on file  Food Insecurity:   . Worried About Charity fundraiser in the Last Year: Not on file  . Ran Out of Food in the Last Year: Not on file  Transportation Needs:   . Lack of Transportation (Medical): Not on file  . Lack of Transportation (Non-Medical): Not on file  Physical Activity:   . Days of Exercise per Week: Not on file  . Minutes of Exercise per Session: Not on file  Stress:   . Feeling of Stress : Not on file  Social Connections:   . Frequency of Communication with Friends and Family: Not on file  . Frequency of Social Gatherings with Friends and Family: Not on file  . Attends Religious Services: Not on file  . Active Member of Clubs or Organizations: Not on file  . Attends Archivist Meetings: Not on file  . Marital Status: Not on file  Intimate Partner Violence:   . Fear of Current or Ex-Partner: Not on file  . Emotionally Abused: Not on file  . Physically Abused: Not on file  . Sexually Abused: Not on file    Review of Systems: ROS is O/W negative except as mentioned in HPI.  Physical Exam: Vital signs in last 24 hours: Temp:  [97.7 F (36.5 C)-99.5 F (37.5 C)] 97.7 F (36.5 C) (11/19  0530) Pulse Rate:  [50-101] 50  (11/19 0530) Resp:  [16-20] 16 (11/19 0530) BP: (138-173)/(76-85) 152/79 (11/19 0530) SpO2:  [98 %-100 %] 99 % (11/19 0530) Weight:  [52.2 kg] 52.2 kg (11/18 1415) Last BM Date: 03/27/20 General:  Alert, thin, pleasant and cooperative in NAD Head:  Normocephalic and atraumatic. Eyes:  Sclera clear, no icterus.  Conjunctiva pink. Ears:  Normal auditory acuity. Mouth:  Poor dentition. Lungs:  Clear throughout to auscultation.  No wheezes, crackles, or rhonchi.  Heart:  Bradycardic, no M/R/G noted. Abdomen:  Soft, non-distended.  BS present but sparse and quiet.  Non-tender.  Midline scar noted from previous surgery, well-healed. Msk:  Symmetrical without gross deformities. Pulses:  Normal pulses noted. Extremities:  Without clubbing or edema. Neurologic:  Alert and oriented x 4;  grossly normal neurologically. Skin:  Intact without significant lesions or rashes. Psych:  Alert and cooperative. Normal mood and affect.  Intake/Output from previous day: 11/18 0701 - 11/19 0700 In: 763.6 [I.V.:763.6] Out: 200 [Urine:200] Intake/Output this shift: Total I/O In: -  Out: 150 [Urine:150]  Lab Results: Recent Labs    03/27/20 1115 03/28/20 1419 03/29/20 0553  WBC 6.6 10.8* 8.0  HGB 9.4* 10.6* 8.6*  HCT 30.2* 34.7* 27.9*  PLT 289 319 286   BMET Recent Labs    03/27/20 1115 03/28/20 1419 03/29/20 0553  NA 139 141 137  K 3.8 4.2 3.8  CL 106 108 111  CO2 26 22 20*  GLUCOSE 84 107* 82  BUN 43* 40* 38*  CREATININE 1.53* 1.21 1.11  CALCIUM 8.8* 9.2 8.4*   LFT Recent Labs    03/29/20 0553  PROT 5.0*  ALBUMIN 2.7*  AST 18  ALT 13  ALKPHOS 72  BILITOT 0.8   Studies/Results: CT ABDOMEN PELVIS W CONTRAST  Addendum Date: 03/28/2020   ADDENDUM REPORT: 03/28/2020 19:32 ADDENDUM: Findings conveyed topatient floor nurse, Adrianna on 03/28/2020 at19:31. Electronically Signed   By: Suzy Bouchard M.D.   On: 03/28/2020 19:32   Result Date: 03/28/2020 CLINICAL DATA:   Abdominal pain.  History of stomach cancer EXAM: CT ABDOMEN AND PELVIS WITH CONTRAST TECHNIQUE: Multidetector CT imaging of the abdomen and pelvis was performed using the standard protocol following bolus administration of intravenous contrast. CONTRAST:  16m OMNIPAQUE IOHEXOL 300 MG/ML  SOLN COMPARISON:  CT 03/14/2020 FINDINGS: Lower chest: Nodular pleuroparenchymal thickening in the LEFT lung base slightly improved. No pleural fluid. Hepatobiliary: Small low-density lesion in the central LEFT hepatic lobe is again demonstrated. Gallbladder is mildly distended which may relate to reported fasting state. Common bile duct is prominent. Findings similar to prior. Pancreas: Pancreas is normal. No ductal dilatation. No pancreatic inflammation. Spleen: Normal spleen Adrenals/urinary tract: Adrenal glands normal. Large nonenhancing cysts within the kidneys. Bladder normal. Stomach/Bowel: There is increased fluid in the stomach compared to prior. Post gastric surgery with apparent Roux-en-Y anatomy. The proximal small bowel is dilated significantly at the level of the gastric enteric anastomosis this dilated loop of bowel is fluid-filled measuring 5 cm. More distally the small bowel is diffusely dilated and fluid-filled. The bowel in the RIGHT lower quadrant measures 4.6 cm compared to 4.4 cm on recent CT. There is no transition point identified the small bowel. Oral contrast from the CT 03/15/2019 remains in the descending colon sigmoid colon. No pneumatosis of the small bowel. No portal venous gas. No intraperitoneal free air. Vascular/Lymphatic: Abdominal aorta is normal caliber with atherosclerotic calcification. There is no retroperitoneal or periportal lymphadenopathy. No pelvic lymphadenopathy.  Reproductive: Prostate enlarged to 5.8 cm Other: There is anasarca of the soft tissues there is little intra-abdominal fat which makes evaluation of the peritoneal contents difficult. Musculoskeletal: No aggressive osseous  lesion. IMPRESSION: 1. Fluid-filled stomach and proximal small bowel. Persistent distension of the fluid-filled small bowel to 4.8 cm compared to 4.6 cm on 03/15/2019. Findings consistent with small bowel obstruction versus severe ileus. No pneumatosis or portal venous gas. No intraperitoneal free air. 2. Oral contrast from 03/24/2017 remains in the LEFT colon. 3. Post partial gastrectomy with Roux-en-Y anatomy. Electronically Signed: By: Suzy Bouchard M.D. On: 03/28/2020 19:19   DG Abdomen Acute W/Chest  Result Date: 03/28/2020 CLINICAL DATA:  Abdominal pain.  Fatigue.  Gastric cancer. EXAM: DG ABDOMEN ACUTE WITH 1 VIEW CHEST COMPARISON:  03/14/2020 CT FINDINGS: Left Port-A-Cath tip at low SVC. Hyperinflation. Remote bilateral rib fractures. midline trachea. Normal heart size. No pleural effusion or pneumothorax. Clear lungs. The upright view demonstrates no free intraperitoneal air or significant air-fluid levels. The supine view demonstrates contrast within normal caliber colon. No gaseous distention of bowel loops. Surgical sutures likely of partial gastrectomy. Aortic atherosclerosis. IMPRESSION: No acute findings. Hyperinflation, suggesting COPD. Electronically Signed   By: Abigail Miyamoto M.D.   On: 03/28/2020 15:55    IMPRESSION:  *SBO vs ileus:  Reports vomiting intermittently with certain foods or larger meals over the past 2-3 months.  No issues with smaller meals or liquids alone.  CT suggests proximal small bowel obstruction vs ileus. *Primary adenocarcinoma of the pyloric antrum, pT4a, PN3aM0, stage IIIB, Grade3, MSI-stable, recurrence at the anastomosis and newadenocarcinomain the gastric fundus in 10/2019, oligo liver metastasis 11/2019, HER2(+), MSS -Initially diagnosed in 02/2017.S/psurgical resection and adjuvant chemotherapyFOLFOX. Patient was not compliant with chemo, oxaliplatin stopped after 4 cycles, and 5-FU stopped after 8 cycles treatment per his request. -He unfortunately  developed local gastric recurrence in July 2021 and liver metastasis in 12/2019 -He started first linechemo FOLFOX q2weeks on 12/07/19.I addedTrastuzumab (Kanjinti)q2weeks and Keytruda q6weekswith C3 on 01/24/20.  PLAN: -Will plan for EGD today/laster this morning to rule out physical obstruction.   Laban Emperor. Zehr  03/29/2020, 9:24 AM    Attending physician's note   I have taken a history, examined the patient and reviewed the chart. I agree with the Advanced Practitioner's note, impression and recommendations.  74 year old gentleman with history of gastric cancer presented with decreased appetite epigastric abdominal pain, postprandial fullness, nausea and vomiting. CT suggestive of proximal small bowel obstruction or ileus He is status post partial gastrectomy with Roux-en-Y for adenocarcinoma of pyloric antrum, had recurrence at anastomosis and gastric fundus adeno CA with liver mets.  He is currently on chemo therapy  We will plan to proceed with EGD for further evaluation, exclude gastric outlet, GJ anastomosis or proximal small bowel obstruction. The risks and benefits as well as alternatives of endoscopic procedure(s) have been discussed and reviewed. All questions answered. The patient agrees to proceed.   The patient was provided an opportunity to ask questions and all were answered. The patient agreed with the plan and demonstrated an understanding of the instructions.  Damaris Hippo , MD [phone]

## 2020-03-29 NOTE — Progress Notes (Signed)
[facility]                                                                              Patient Demographics  Gabriel Poole, is a 74 y.o. male, DOB - 10/26/1945, IEP:[id number]  Admit date - 03/28/2020   Admitting Physician Teddy Spike, DO  Outpatient Primary MD for the patient is Jason Coop, FNP  Outpatient specialists:   LOS - 0  days   Medical records reviewed and are as summarized below:    Chief Complaint  Patient presents with  . Fatigue  . Abdominal Pain       Brief summary   Patient is a 74 year old male with history of gastric CA, follows with Dr. Mosetta Putt, hypertension, hyperlipidemia presented with 2 weeks of nausea vomiting, poor appetite and epigastric pain.  Patient tried changing his diet but did not help.  Patient reported no BM for last 1 week.  He tried adding laxatives with no improvement.  Initially, left AMA from the ED however spoke with oncologist on the day of admission and return back.   Assessment & Plan   Intractable nausea vomiting and abdominal pain secondary to severe ileus versus SBO in the setting of gastric cancer -Continue n.p.o. status, IV fluids, CT abdomen pelvis showed persistent distention of the fluid-filled small bowel to 4.8 cm, consistent with SBO versus severe ileus, no pneumatosis or portal venous gas. -Abdominal x-ray this morning showed persistent small bowel dilatation may reflect obstruction or ileus -Surgery consulted, discussed with Dr. Doylene Canard, recommended GI if patient needs endoscopy given his history of gastric CA -Awaiting GI recommendations, ordered enema (no BM for 1 week), may need NG suction if no plans for endoscopy  History of gastric CA - will notify oncology, last chemo on 10/27  Essential hypertension Currently n.p.o., placed on IV hydralazine as needed with parameters  Hyperlipidemia Currently n.p.o., holding statin  BPH Currently n.p.o.  Severe protein calorie  malnutrition Estimated body mass index is 16.5 kg/m as calculated from the following:   Height as of this encounter: 5\' 10"  (1.778 m).   Weight as of this encounter: 52.2 kg.  Code Status full CODE STATUS DVT Prophylaxis:  Lovenox Family Communication: Discussed all imaging results, lab results, explained to the patient.  Called patient's daughter on the phone, unable to make contact, will try later.   Disposition Plan:     Status is: Observation  The patient will require care spanning > 2 midnights and should be moved to inpatient because: IV treatments appropriate due to intensity of illness or inability to take PO  Dispo: The patient is from: Home              Anticipated d/c is to: Home              Anticipated d/c date is: 2 days              Patient currently is not medically stable to d/c.  Currently n.p.o., persistent SBO, awaiting general surgery and GI consultation      Time Spent in minutes   35 minutes  Procedures:  None  Consultants:   General surgery Gastroenterology  Antimicrobials:   Anti-infectives (From admission, onward)   None          Medications  Scheduled Meds: . enoxaparin (LOVENOX) injection  40 mg Subcutaneous Q24H  . sorbitol, milk of mag, mineral oil, glycerin (SMOG) enema  960 mL Rectal Once   Continuous Infusions: . sodium chloride 100 mL/hr at 03/29/20 0835   PRN Meds:.hydrALAZINE, morphine injection, ondansetron **OR** ondansetron (ZOFRAN) IV      Subjective:   Gabriel Poole was seen and examined today.  No BM for last 1 week and since admission.  Currently no vomiting. Patient denies dizziness, chest pain, shortness of breath.. No acute events overnight.    Objective:   Vitals:   03/28/20 1622 03/28/20 1732 03/28/20 2105 03/29/20 0530  BP: (!) 173/78 (!) 154/76 138/84 (!) 152/79  Pulse: (!) 54 (!) 54 (!) 101 (!) 50  Resp: 16 16 18 16   Temp: 98.7 F (37.1 C) 97.9 F (36.6 C) 98.2 F (36.8 C) 97.7 F (36.5 C)   TempSrc: Oral Oral Oral Oral  SpO2: 100% 100% 100% 99%  Weight:      Height:        Intake/Output Summary (Last 24 hours) at 03/29/2020 1004 Last data filed at 03/29/2020 0900 Gross per 24 hour  Intake 763.57 ml  Output 350 ml  Net 413.57 ml     Wt Readings from Last 3 Encounters:  03/28/20 52.2 kg  03/27/20 51.6 kg  03/22/20 53.9 kg     Exam  General: Alert and oriented x 3, NAD  Cardiovascular: S1 S2 auscultated, no murmurs, RRR  Respiratory: Clear to auscultation bilaterally, no wheezing, rales or rhonchi  Gastrointestinal: Soft mildly distended, upper midline scar, nontender  Ext: no pedal edema bilaterally  Neuro: no neuro deficits  Musculoskeletal: No digital cyanosis, clubbing  Skin: No rashes  Psych: Normal affect and demeanor, alert and oriented x3    Data Reviewed:  I have personally reviewed following labs and imaging studies  Micro Results Recent Results (from the past 240 hour(s))  SARS Coronavirus 2 (TAT 6-24 hrs)     Status: None   Collection Time: 03/27/20 12:35 PM   Specimen: Nasopharyngeal Swab  Result Value Ref Range Status   SARS Coronavirus 2 NEGATIVE NEGATIVE Final    Comment: (NOTE) SARS-CoV-2 target nucleic acids are NOT DETECTED.  The SARS-CoV-2 RNA is generally detectable in upper and lower respiratory specimens during the acute phase of infection. Negative results do not preclude SARS-CoV-2 infection, do not rule out co-infections with other pathogens, and should not be used as the sole basis for treatment or other patient management decisions. Negative results must be combined with clinical observations, patient history, and epidemiological information. The expected result is Negative.  Fact Sheet for Patients: HairSlick.no  Fact Sheet for Healthcare Providers: quierodirigir.com  This test is not yet approved or cleared by the [place] FDA and  has been  authorized for detection and/or diagnosis of SARS-CoV-2 by FDA under an Emergency Use Authorization (EUA). This EUA will remain  in effect (meaning this test can be used) for the duration of the COVID-19 declaration under Se ction 564(b)(1) of the Act, 21 U.S.C. section 360bbb-3(b)(1), unless the authorization is terminated or revoked sooner.  Performed at [facility], [address], [place], [place] [address]     Radiology Reports CT ABDOMEN PELVIS W CONTRAST  Addendum Date: 03/28/2020   ADDENDUM REPORT: 03/28/2020 19:32 ADDENDUM: Findings conveyed topatient  floor nurse, Adrianna on 03/28/2020 at19:31. Electronically Signed   By: Genevive Bi M.D.   On: 03/28/2020 19:32   Result Date: 03/28/2020 CLINICAL DATA:  Abdominal pain.  History of stomach cancer EXAM: CT ABDOMEN AND PELVIS WITH CONTRAST TECHNIQUE: Multidetector CT imaging of the abdomen and pelvis was performed using the standard protocol following bolus administration of intravenous contrast. CONTRAST:  OMNIPAQUE IOHEXOL 300 MG/ML  SOLN COMPARISON:  CT 03/14/2020 FINDINGS: Lower chest: Nodular pleuroparenchymal thickening in the LEFT lung base slightly improved. No pleural fluid. Hepatobiliary: Small low-density lesion in the central LEFT hepatic lobe is again demonstrated. Gallbladder is mildly distended which may relate to reported fasting state. Common bile duct is prominent. Findings similar to prior. Pancreas: Pancreas is normal. No ductal dilatation. No pancreatic inflammation. Spleen: Normal spleen Adrenals/urinary tract: Adrenal glands normal. Large nonenhancing cysts within the kidneys. Bladder normal. Stomach/Bowel: There is increased fluid in the stomach compared to prior. Post gastric surgery with apparent Roux-en-Y anatomy. The proximal small bowel is dilated significantly at the level of the gastric enteric anastomosis this dilated loop of bowel is fluid-filled measuring 5 cm. More distally the small bowel  is diffusely dilated and fluid-filled. The bowel in the RIGHT lower quadrant measures 4.6 cm compared to 4.4 cm on recent CT. There is no transition point identified the small bowel. Oral contrast from the CT 03/15/2019 remains in the descending colon sigmoid colon. No pneumatosis of the small bowel. No portal venous gas. No intraperitoneal free air. Vascular/Lymphatic: Abdominal aorta is normal caliber with atherosclerotic calcification. There is no retroperitoneal or periportal lymphadenopathy. No pelvic lymphadenopathy. Reproductive: Prostate enlarged to 5.8 cm Other: There is anasarca of the soft tissues there is little intra-abdominal fat which makes evaluation of the peritoneal contents difficult. Musculoskeletal: No aggressive osseous lesion. IMPRESSION: 1. Fluid-filled stomach and proximal small bowel. Persistent distension of the fluid-filled small bowel to 4.8 cm compared to 4.6 cm on 03/15/2019. Findings consistent with small bowel obstruction versus severe ileus. No pneumatosis or portal venous gas. No intraperitoneal free air. 2. Oral contrast from 03/24/2017 remains in the LEFT colon. 3. Post partial gastrectomy with Roux-en-Y anatomy. Electronically Signed: By: Genevive Bi M.D. On: 03/28/2020 19:19   CT CHEST ABDOMEN PELVIS W CONTRAST  Result Date: 03/14/2020 CLINICAL DATA:  Restaging gastric cancer. Weight loss and loss of appetite. EXAM: CT CHEST, ABDOMEN, AND PELVIS WITH CONTRAST TECHNIQUE: Multidetector CT imaging of the chest, abdomen and pelvis was performed following the standard protocol during bolus administration of intravenous contrast. CONTRAST:  OMNIPAQUE IOHEXOL 300 MG/ML  SOLN COMPARISON:  PET-CT 12/05/2019 FINDINGS: CT CHEST FINDINGS Cardiovascular: Left Port-A-Cath tip: SVC. Aortic and branch vessel atherosclerotic vascular calcifications. Ascending thoracic aortic aneurysm 4.2 cm in diameter, stable. Mediastinum/Nodes: Unremarkable Lungs/Pleura: Bilateral airway  thickening with airway plugging in both lower lobes and resulting airspace opacities due to atelectasis and possibly pneumonia in the lower lobes. Musculoskeletal: A few small sclerotic right rib lesions are unchanged and not previously hypermetabolic, and considered benign. CT ABDOMEN PELVIS FINDINGS Hepatobiliary: 1.0 cm hypodense lesion in segment 4 of the liver corresponding to hypermetabolic lesion shown on prior PET-CT, but subjectively reduced in size compared to prior PET-CT. Stable 0.8 by 0.5 cm hypodense lesion in the lateral segment left hepatic lobe on image 57/2, no change from 04/04/2019. Borderline wall thickening in the gallbladder. Pancreas: Unremarkable Spleen: Unremarkable Adrenals/Urinary Tract: Both adrenal glands appear normal. Bilateral renal cysts are present. Nondistended urinary bladder. Stomach/Bowel: Partial gastrectomy. There is previously  very high activity in the gastric wall. There are some regions of indistinctness and thickening of the gastric wall and residual tumor is not excluded for example on images 60-62 of series 2. There are dilated loops of small bowel anteriorly measuring up to 4.2 cm in diameter, without a well-defined lead point for obstruction. Cannot exclude wall thickening in the nondistended ascending colon and hepatic flexure. Vascular/Lymphatic: Aortoiliac atherosclerotic vascular disease. 1 no well-defined adenopathy. Reproductive: Marked prostatomegaly. Other: Diffuse low-grade subcutaneous and mesenteric edema. Trace ascites in the pelvis. Musculoskeletal: Unremarkable IMPRESSION: 1. The small hypodense lesion in segment 4 of the liver was previously hypermetabolic, and is subjectively reduced in size compared to prior PET-CT. 2. Dilated loops of small bowel anteriorly without a well-defined lead point for obstruction. This could be from ileus or low-grade partial small bowel obstruction. 3. Continued gastric wall thickening is not entirely specific but residual  gastric tumor is not excluded. Postoperative findings along the residual stomach. 4. Cannot exclude wall thickening in the nondistended ascending colon and hepatic flexure. 5. Diffuse low-grade subcutaneous and mesenteric edema. Trace ascites in the pelvis. Appearance favors third spacing of fluid. 6. Bilateral airway thickening with airway plugging in both lower lobes and resulting airspace opacities due to atelectasis and possibly pneumonia in the lower lobes. 7. Marked prostatomegaly. 8. Stable 4.2 cm ascending thoracic aortic aneurysm. 9. Aortic atherosclerosis. Aortic Atherosclerosis (ICD10-I70.0). Electronically Signed   By: Gaylyn Rong M.D.   On: 03/14/2020 17:55   DG Abd 2 Views  Result Date: 03/29/2020 CLINICAL DATA:  Lower abdominal pain. History of small-bowel obstruction. EXAM: ABDOMEN - 2 VIEW COMPARISON:  CT abdomen and pelvis 03/28/2020. Abdominal radiographs 03/28/2020. FINDINGS: There is no evidence of intraperitoneal free air. A moderate amount of stool is again seen throughout the colon. There are multiple loops of mildly to moderately dilated small bowel in the mid and lower abdomen which were not apparent on yesterday's radiographs but are similar in caliber to the interval CT. Sutures are noted in the left upper quadrant related to prior partial gastrectomy. Excreted IV contrast is noted in the bladder. Mild left basilar lung opacity likely reflects atelectasis. No acute osseous abnormality is seen. IMPRESSION: Persistent small bowel dilatation which may reflect obstruction or ileus. Electronically Signed   By: Sebastian Ache M.D.   On: 03/29/2020 09:38   DG Abdomen Acute W/Chest  Result Date: 03/28/2020 CLINICAL DATA:  Abdominal pain.  Fatigue.  Gastric cancer. EXAM: DG ABDOMEN ACUTE WITH 1 VIEW CHEST COMPARISON:  03/14/2020 CT FINDINGS: Left Port-A-Cath tip at low SVC. Hyperinflation. Remote bilateral rib fractures. midline trachea. Normal heart size. No pleural effusion or  pneumothorax. Clear lungs. The upright view demonstrates no free intraperitoneal air or significant air-fluid levels. The supine view demonstrates contrast within normal caliber colon. No gaseous distention of bowel loops. Surgical sutures likely of partial gastrectomy. Aortic atherosclerosis. IMPRESSION: No acute findings. Hyperinflation, suggesting COPD. Electronically Signed   By: Jeronimo Greaves M.D.   On: 03/28/2020 15:55    Lab Data:  CBC: Recent Labs  Lab 03/22/20 1219 03/27/20 1115 03/28/20 1419 03/29/20 0553  WBC 6.7 6.6 10.8* 8.0  NEUTROABS 4.7 4.5  --   --   HGB 9.6* 9.4* 10.6* 8.6*  HCT 30.3* 30.2* 34.7* 27.9*  MCV 86.6 88.6 92.0 92.1  PLT 307 289 319 286   Basic Metabolic Panel: Recent Labs  Lab 03/22/20 1219 03/27/20 1115 03/28/20 1419 03/29/20 0553  NA 136 139 141 137  K 4.4 3.8  4.2 3.8  CL 103 106 108 111  CO2 25 26 22  20*  GLUCOSE 80 84 107* 82  BUN 27* 43* 40* 38*  CREATININE 2.20* 1.53* 1.21 1.11  CALCIUM 9.1 8.8* 9.2 8.4*   GFR: Estimated Creatinine Clearance: 43.8 mL/min (by C-G formula based on SCr of 1.11 mg/dL). Liver Function Tests: Recent Labs  Lab 03/22/20 1219 03/27/20 1115 03/28/20 1419 03/29/20 0553  AST 14* 21 27 18   ALT 6 8 17 13   ALKPHOS 85 76 89 72  BILITOT 0.9 0.6 0.8 0.8  PROT 5.6* 5.5* 5.8* 5.0*  ALBUMIN 2.9* 2.9* 3.3* 2.7*   Recent Labs  Lab 03/28/20 1419  LIPASE 51   No results for input(s): AMMONIA in the last 168 hours. Coagulation Profile: No results for input(s): INR, PROTIME in the last 168 hours. Cardiac Enzymes: No results for input(s): CKTOTAL, CKMB, CKMBINDEX, TROPONINI in the last 168 hours. BNP (last 3 results) No results for input(s): PROBNP in the last 8760 hours. HbA1C: No results for input(s): HGBA1C in the last 72 hours. CBG: No results for input(s): GLUCAP in the last 168 hours. Lipid Profile: No results for input(s): CHOL, HDL, LDLCALC, TRIG, CHOLHDL, LDLDIRECT in the last 72 hours. Thyroid  Function Tests: No results for input(s): TSH, T4TOTAL, FREET4, T3FREE, THYROIDAB in the last 72 hours. Anemia Panel: No results for input(s): VITAMINB12, FOLATE, FERRITIN, TIBC, IRON, RETICCTPCT in the last 72 hours. Urine analysis:    Component Value Date/Time   COLORURINE BROWN (A) 11/09/2017 1626   APPEARANCEUR HAZY (A) 11/09/2017 1626   LABSPEC 1.025 11/09/2017 1626   PHURINE 6.5 11/09/2017 1626   GLUCOSEU NEGATIVE 11/09/2017 1626   HGBUR LARGE (A) 11/09/2017 1626   BILIRUBINUR SMALL (A) 11/09/2017 1626   BILIRUBINUR color interference 03/07/2015 1309   KETONESUR TRACE (A) 11/09/2017 1626   PROTEINUR 100 (A) 11/09/2017 1626   UROBILINOGEN 0.2 12/17/2010 1744   NITRITE NEGATIVE 11/09/2017 1626   LEUKOCYTESUR TRACE (A) 11/09/2017 1626     Kanda Deluna M.D. [facility] 03/29/2020, 10:04 AM   Call night coverage person covering after 7pm

## 2020-03-29 NOTE — Progress Notes (Signed)
Attempted to do the smog enema for Mr. Gabriel Poole.  It would not go in.  Another nurse tried as well.  I messaged Dr. Tana Coast to see if anything else should be done.

## 2020-03-29 NOTE — Progress Notes (Addendum)
HEMATOLOGY-ONCOLOGY PROGRESS NOTE  SUBJECTIVE: Gabriel Poole is under our care for metastatic gastric cancer.  Now admitted with nausea, vomiting, epigastric pain.  Was seen in our office 2 days ago with similar symptoms admission was recommended.  The patient declined and left AMA.  CT abdomen/pelvis with contrast showed findings consistent with small bowel obstruction versus severe ileus.  Has been seen by general surgery and GI.  General surgery recommends medical management.  GI planning for upper endoscopy.  The patient is on his way to EGD at the time my visit.  He denies abdominal pain this morning.  States that he is not having any nausea or vomiting when I have seen him.  However, just prior to my visit, he told nursing his pain was 8/10.  Nursing had not yet given him any pain medication when he told me he did not have any abdominal pain.  Oncology History Overview Note  Cancer Staging Malignant neoplasm of body of stomach ([place]) Staging form: Stomach, AJCC 8th Edition - Clinical stage from 03/11/2017: Stage IIB (cT3, cN0, cM0) - Unsigned - Pathologic stage from 04/22/2017: Stage IIIB (pT4a, pN3a, cM0) - Signed by Alla Feeling, NP on 05/17/2017     Malignant neoplasm of body of stomach ([place])  02/17/2017 Initial Diagnosis   Malignant neoplasm of body of stomach ([place])   02/17/2017 Pathology Results   Diagnosis Stomach, biopsy, gastric ulcer - ADENOCARCINOMA. Microscopic Comment Several of the fragments are involved by moderately differentiated adenocarcinoma.   02/17/2017 Procedure   UPPER ENDOSCOPY  FINDINGS - Normal esophagus. - Z-line irregular, 44 cm from the incisors. - Red blood in the gastric body and in the gastric antrum. - Large gastric ulcer. Biopsied. - Erythematous mucosa in the antrum. - Normal cardia, gastric fundus, gastric body and pylorus. - Normal duodenal bulb and second portion of the duodenum. Comment: Endoscopic appearence concerning for malignant  ulcer.   03/11/2017 Procedure   UPPER EUS PER DR. Ardis Hughs Findings: 1. The esophagus was normal. 2. There was a 2-3cm ulcerated, malignant mass along the greater curvature of the stomach, approximately mid-body. The mass was non-circumferential. 3. The duodenum was normal. Endosonographic Finding 1. The gastric mass above correlated with a hypoechoic and heterogenous non-circumferential mass that measured 2.9cm across, 76mm deep. The endosonographic borders were poorly defined and there was clear sonographic evidence suggesting invasion into and through the muscularis propria layer without evident invasion into nearby organs (uT3). 2. The duodenal lymphnode described on recent CT scan appeared reactive by [place] criteria. 3. No perigastric adenopathy (uN0) 4. Limited views of the liver, spleen, pancreas, bile duct, gallbladder were all normal. - Along the greater curvature of the stomach, approximately mid-body, there is a 2.9cm uT3N0 (clinical stage IIB) gastric adenocarcinoma.   04/06/2017 Imaging   CT CHEST IMPRESSION: 1. No acute cardiopulmonary abnormalities. 2. Two small pulmonary nodules are noted measuring up to 5 mm. Nonspecific but warrant attention on follow-up imaging follow up 3. Nonspecific hyperdense, possibly enhancing lesion along the dome of liver is identified. In a patient that is at increased risk for more definitive assessment of this structure with contrast enhanced MRI of the liver is advised.   04/21/2017 Imaging   MR ABDOMEN IMPRESSION: 1. Enhancing 2.9 cm mass along the lesser curvature in the gastric antrum, compatible with known gastric malignancy . 2. Mildly enlarged gastrohepatic ligament lymph node, cannot exclude nodal metastasis. 3. No definite liver metastatic disease. Hyperenhancing 0.9 cm liver dome mass remains inconclusive, although the MRI features  are most suggestive of a flash filling hemangioma, and the mass has been stable for nearly 2  months. Additional subcentimeter focus of hyperenhancement in the lateral segment left liver lobe is most likely a benign transient vascular phenomenon. Recommend attention to these lesions on a follow-up MRI abdomen without and with IV contrast in 3-6 months. This recommendation follows ACR consensus guidelines: Management of Incidental Liver Lesions on CT: A White Paper of the ACR Incidental Findings Committee. J Am Coll Radiol 2017; 97:3532-9924. 4. Benign right adrenal adenoma.     04/22/2017 Pathology Results   Diagnosis 1. Liver, biopsy - BILE DUCT HAMARTOMA. - THERE IS NO EVIDENCE OF MALIGNANCY. 2. Lymph nodes, regional resection, portal - METASTATIC CARCINOMA IN 2 OF 6 LYMPH NODES (2/6). 3. Stomach, resection for tumor, distal - INVASIVE ADENOCARCINOMA, POORLY DIFFERENTIATED, SPANNING 3.8 CM. - PERINEURAL INVASION IS IDENTIFIED. - ADENOCARCINOMA INVOLVES THE SEROSA. - METASTATIC CARCINOMA IN 12 OF 30 LYMPH NODES (12/30), WITH EXTRACAPSULAR EXTENSION. - SEE ONCOLOGY TABLE BELOW. 4. Lymph node, biopsy, common hepatic artery - THERE IS NO EVIDENCE OF CARCINOMA IN 1 OF 1 LYMPH NODE (0/1). Microscopic Comment 3. STOMACH: Specimen: Stomach. Procedure: Partial gastrectomy. Tumor Site: Greater curvature. Tumor Size: 3.8 cm Histologic Type: Adenocarcinoma. Histologic Grade: G3: poorly differentiated. Microscopic Extent of Tumor: Adenocarcinoma involves the serosa. Margins (select all that apply): Adenoca Proximal Margin: Negative for adenocarcinoma. Distal Margin: Negative for adenocarcinoma. Treatment Effect: N/A Lymph-Vascular Invasion: Not identified. Perineural Invasion: Present. Additional findings: Chronic gastritis. Ancillary testing: Can be performed upon clinician request. 1 of 3 Duplicate copy FINAL for USMAN, MILLETT ([id number]) Microscopic Comment(continued) Lymph nodes: number examined - 37; number positive: 14 Pathologic Staging: pT4a, pN3a  (JBK:gt, 04/27/17)   06/02/2017 - 09/30/2017 Adjuvant Chemotherapy   FOLFOX every 2 weeks, oxaliplatin stopped in March 2019 due to neuropathy, chemo stopped on 09/30/2017 per pt's request    10/29/2017 Imaging   10/29/2017 CT CAP  IMPRESSION: 1. Status post distal gastrectomy. No evidence for metastatic disease in the chest, abdomen, or pelvis. 2. Stable tiny right pulmonary nodules. Continued attention on follow-up recommended. 3. 9 mm hypervascular lesion in the dome of the liver is stable over multiple studies back to 04/05/2017. Continued attention on follow-up suggested. 4. Ascending thoracic aorta measures 4.2 cm diameter. Recommend annual imaging followup by CTA or MRA. This recommendation follows 2010 ACCF/AHA/AATS/ACR/ASA/SCA/SCAI/SIR/STS/SVM Guidelines for the Diagnosis and Management of Patients with Thoracic Aortic Disease. Circulation. 2010; 121: I297-L892 . 5. Marked prostatomegaly. 6.  Aortic Atherosclerois (ICD10-170.0)   05/09/2018 Imaging   05/09/2018 CT CAP IMPRESSION: 1. No definite findings to suggest metastatic disease to the chest, abdomen or pelvis. 2. Multiple small pulmonary nodules scattered throughout the lungs bilaterally measuring 5 mm or less in size, stable compared to prior studies from 2018, favored to be benign. Continued attention on future follow-up examinations is recommended. 3. Aortic atherosclerosis with ectasia of the ascending thoracic aorta (4 cm in diameter). Recommend annual imaging followup by CTA or MRA. This recommendation follows 2010 ACCF/AHA/AATS/ACR/ASA/SCA/SCAI/SIR/STS/SVM Guidelines for the Diagnosis and Management of Patients with Thoracic Aortic Disease. Circulation. 2010; 121: J194-R740. 4. Multiple small calculi lying dependently in the right-side of the urinary bladder. 5. Severe prostatomegaly. 6. Additional incidental findings, as above.   04/04/2019 Imaging   CT AP IMPRESSION: 1. Redemonstrated postoperative  findings of distal gastrectomy and gastrojejunostomy (series 2, image 18, series 4, image 27). No evidence of malignant recurrence or metastatic disease in the abdomen or pelvis. 2. Subcutaneous body fat is  substantially diminished in comparison to prior examination, in keeping with reported history of weight loss. 3. Gross prostatomegaly and urinary bladder wall thickening, likely due to chronic outlet obstruction. 4.  Aortic Atherosclerosis (ICD10-I70.0).   08/16/2019 Imaging   CT CAP w contrast IMPRESSION: 1. No acute findings within the chest, abdomen or pelvis. No specific findings identified to suggest residual or recurrent tumor or metastatic disease. 2. Small nonspecific pulmonary nodules are unchanged. 3. Marked enlargement of the prostate gland. 4. Multiple tiny bladder stones. 5. Aortic atherosclerosis. Aortic Atherosclerosis (ICD10-I70.0).   11/02/2019 Procedure   EGD impression - Normal hypopharynx. - Normal esophagus. - Z-line regular, 45 cm from the incisors. - Patent Billroth II gastrojejunostomy was found. 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 Biopsied. - Gastric ulcer with raised margins appears to be separate lesion. Biopsied.  comment: recurrent tumor at anastamosis appears to be separate form fundal lesion. these lesions are the source of chronic blood loss.  Colonoscopy impression: - Perianal skin tags found on perianal exam. - The entire examined colon is normal. - External and internal hemorrhoids. - No specimens collected.   11/02/2019 Pathology Results   FINAL MICROSCOPIC DIAGNOSIS:  A. STOMACH, BIOPSY:  -  Poorly differentiated carcinoma  -  See comment  B. STOMACH, FUNDUS, BIOPSY:  -  Adenocarcinoma  -  See comment  COMMENT:  A and B.  The carcinoma in part A is more poorly differentiated than  what is seen in part B but both are favored to be adenocarcinoma.  Dr.  Jeannie Done reviewed the case and agrees with the above diagnosis. Dr.   Laural Golden was notified of these results on November 03, 2019.    12/05/2019 PET scan   IMPRESSION: Marked hypermetabolic activity within the thickened stomach in this patient with known gastric cancer with associated metastatic disease to the liver and small lymph nodes in the area of the hepatic gastric recess that are suspicious based on location and size though not particularly FDG avid.   Small lymph nodes along the posterolateral margin of the aorta with mild increased FDG uptake (image 107) (SUVmax = 2.8 these are approximately 6-7 mm and there are several small nodes tracking beneath the LEFT retrocrural region. Attention on follow-up)   Increased FDG uptake in the central portion of the prostate likely within the prosthetic urethra given the marked hypermetabolic features in the central location. Would however consider correlation with PSA as clinically warranted. Prostate is markedly enlarged otherwise with heterogeneous FDG uptake.   Aortic Atherosclerosis (ICD10-I70.0).    12/07/2019 -  Chemotherapy   FOLFOX every 2 weeks starting 12/07/19. Add Transtuzumab (Kanjinti) q2 weeks and Keytruda q6weeks with C3 on 01/24/20.    12/14/2019 Pathology Results   FINAL MICROSCOPIC DIAGNOSIS:   A. LIVER, NEEDLE CORE BIOPSY:  - Poorly differentiated adenocarcinoma, see comment.   COMMENT:   The tumor has a similar appearance to the patient's recent gastric  biopsies ([id number]). There is sufficient material for additional  testing if requested.    03/14/2020 Imaging   CT CAP IMPRESSION: 1. The small hypodense lesion in segment 4 of the liver was previously hypermetabolic, and is subjectively reduced in size compared to prior PET-CT. 2. Dilated loops of small bowel anteriorly without a well-defined lead point for obstruction. This could be from ileus or low-grade partial small bowel obstruction. 3. Continued gastric wall thickening is not entirely specific but residual gastric tumor  is not excluded. Postoperative findings along the residual stomach. 4. Cannot exclude wall  thickening in the nondistended ascending colon and hepatic flexure. 5. Diffuse low-grade subcutaneous and mesenteric edema. Trace ascites in the pelvis. Appearance favors third spacing of fluid. 6. Bilateral airway thickening with airway plugging in both lower lobes and resulting airspace opacities due to atelectasis and possibly pneumonia in the lower lobes. 7. Marked prostatomegaly. 8. Stable 4.2 cm ascending thoracic aortic aneurysm. 9. Aortic atherosclerosis.      REVIEW OF SYSTEMS:   Constitutional: Denies fevers, chills Respiratory: Denies cough, dyspnea or wheezes Cardiovascular: Denies palpitation, chest discomfort Gastrointestinal: Has abdominal pain, nausea, vomiting on admission.  Denies this morning. Skin: Denies abnormal skin rashes Lymphatics: Denies new lymphadenopathy or easy bruising Neurological:Denies numbness, tingling or new weaknesses Behavioral/Psych: Mood is stable, no new changes  Extremities: No lower extremity edema All other systems were reviewed with the patient and are negative.  I have reviewed the past medical history, past surgical history, social history and family history with the patient and they are unchanged from previous note.   PHYSICAL EXAMINATION: ECOG PERFORMANCE STATUS: 2 - Symptomatic, <50% confined to bed  Vitals:   03/28/20 2105 03/29/20 0530  BP: 138/84 (!) 152/79  Pulse: (!) 101 (!) 50  Resp: 18 16  Temp: 98.2 F (36.8 C) 97.7 F (36.5 C)  SpO2: 100% 99%   Filed Weights   03/28/20 1415  Weight: 52.2 kg    Intake/Output from previous day: 11/18 0701 - 11/19 0700 In: 763.6 [I.V.:763.6] Out: 200 [Urine:200]  GENERAL:alert, no distress and comfortable SKIN: skin color, texture, turgor are normal, no rashes or significant lesions LYMPH:  no palpable lymphadenopathy in the cervical, axillary or inguinal LUNGS: clear to  auscultation and percussion with normal breathing effort HEART: regular rate & rhythm and no murmurs and no lower extremity edema ABDOMEN:abdomen soft, non-tender and normal bowel sounds NEURO: alert & oriented x 3 with fluent speech, no focal motor/sensory deficits  LABORATORY DATA:  I have reviewed the data as listed CMP Latest Ref Rng & Units 03/29/2020 03/28/2020 03/27/2020  Glucose 70 - 99 mg/dL 82 107(H) 84  BUN 8 - 23 mg/dL 38(H) 40(H) 43(H)  Creatinine 0.61 - 1.24 mg/dL 1.11 1.21 1.53(H)  Sodium 135 - 145 mmol/L 137 141 139  Potassium 3.5 - 5.1 mmol/L 3.8 4.2 3.8  Chloride 98 - 111 mmol/L 111 108 106  CO2 22 - 32 mmol/L 20(L) 22 26  Calcium 8.9 - 10.3 mg/dL 8.4(L) 9.2 8.8(L)  Total Protein 6.5 - 8.1 g/dL 5.0(L) 5.8(L) 5.5(L)  Total Bilirubin 0.3 - 1.2 mg/dL 0.8 0.8 0.6  Alkaline Phos 38 - 126 U/L 72 89 76  AST 15 - 41 U/L 18 27 21   ALT 0 - 44 U/L 13 17 8     Lab Results  Component Value Date   WBC 8.0 03/29/2020   HGB 8.6 (L) 03/29/2020   HCT 27.9 (L) 03/29/2020   MCV 92.1 03/29/2020   PLT 286 03/29/2020   NEUTROABS 4.5 03/27/2020    CT ABDOMEN PELVIS W CONTRAST  Addendum Date: 03/28/2020   ADDENDUM REPORT: 03/28/2020 19:32 ADDENDUM: Findings conveyed topatient floor nurse, Adrianna on 03/28/2020 at19:31. Electronically Signed   By: Suzy Bouchard M.D.   On: 03/28/2020 19:32   Result Date: 03/28/2020 CLINICAL DATA:  Abdominal pain.  History of stomach cancer EXAM: CT ABDOMEN AND PELVIS WITH CONTRAST TECHNIQUE: Multidetector CT imaging of the abdomen and pelvis was performed using the standard protocol following bolus administration of intravenous contrast. CONTRAST:  185mL OMNIPAQUE IOHEXOL 300 MG/ML  SOLN  COMPARISON:  CT 03/14/2020 FINDINGS: Lower chest: Nodular pleuroparenchymal thickening in the LEFT lung base slightly improved. No pleural fluid. Hepatobiliary: Small low-density lesion in the central LEFT hepatic lobe is again demonstrated. Gallbladder is mildly  distended which may relate to reported fasting state. Common bile duct is prominent. Findings similar to prior. Pancreas: Pancreas is normal. No ductal dilatation. No pancreatic inflammation. Spleen: Normal spleen Adrenals/urinary tract: Adrenal glands normal. Large nonenhancing cysts within the kidneys. Bladder normal. Stomach/Bowel: There is increased fluid in the stomach compared to prior. Post gastric surgery with apparent Roux-en-Y anatomy. The proximal small bowel is dilated significantly at the level of the gastric enteric anastomosis this dilated loop of bowel is fluid-filled measuring 5 cm. More distally the small bowel is diffusely dilated and fluid-filled. The bowel in the RIGHT lower quadrant measures 4.6 cm compared to 4.4 cm on recent CT. There is no transition point identified the small bowel. Oral contrast from the CT 03/15/2019 remains in the descending colon sigmoid colon. No pneumatosis of the small bowel. No portal venous gas. No intraperitoneal free air. Vascular/Lymphatic: Abdominal aorta is normal caliber with atherosclerotic calcification. There is no retroperitoneal or periportal lymphadenopathy. No pelvic lymphadenopathy. Reproductive: Prostate enlarged to 5.8 cm Other: There is anasarca of the soft tissues there is little intra-abdominal fat which makes evaluation of the peritoneal contents difficult. Musculoskeletal: No aggressive osseous lesion. IMPRESSION: 1. Fluid-filled stomach and proximal small bowel. Persistent distension of the fluid-filled small bowel to 4.8 cm compared to 4.6 cm on 03/15/2019. Findings consistent with small bowel obstruction versus severe ileus. No pneumatosis or portal venous gas. No intraperitoneal free air. 2. Oral contrast from 03/24/2017 remains in the LEFT colon. 3. Post partial gastrectomy with Roux-en-Y anatomy. Electronically Signed: By: Suzy Bouchard M.D. On: 03/28/2020 19:19   CT CHEST ABDOMEN PELVIS W CONTRAST  Result Date: 03/14/2020 CLINICAL  DATA:  Restaging gastric cancer. Weight loss and loss of appetite. EXAM: CT CHEST, ABDOMEN, AND PELVIS WITH CONTRAST TECHNIQUE: Multidetector CT imaging of the chest, abdomen and pelvis was performed following the standard protocol during bolus administration of intravenous contrast. CONTRAST:  135mL OMNIPAQUE IOHEXOL 300 MG/ML  SOLN COMPARISON:  PET-CT 12/05/2019 FINDINGS: CT CHEST FINDINGS Cardiovascular: Left Port-A-Cath tip: SVC. Aortic and branch vessel atherosclerotic vascular calcifications. Ascending thoracic aortic aneurysm 4.2 cm in diameter, stable. Mediastinum/Nodes: Unremarkable Lungs/Pleura: Bilateral airway thickening with airway plugging in both lower lobes and resulting airspace opacities due to atelectasis and possibly pneumonia in the lower lobes. Musculoskeletal: A few small sclerotic right rib lesions are unchanged and not previously hypermetabolic, and considered benign. CT ABDOMEN PELVIS FINDINGS Hepatobiliary: 1.0 cm hypodense lesion in segment 4 of the liver corresponding to hypermetabolic lesion shown on prior PET-CT, but subjectively reduced in size compared to prior PET-CT. Stable 0.8 by 0.5 cm hypodense lesion in the lateral segment left hepatic lobe on image 57/2, no change from 04/04/2019. Borderline wall thickening in the gallbladder. Pancreas: Unremarkable Spleen: Unremarkable Adrenals/Urinary Tract: Both adrenal glands appear normal. Bilateral renal cysts are present. Nondistended urinary bladder. Stomach/Bowel: Partial gastrectomy. There is previously very high activity in the gastric wall. There are some regions of indistinctness and thickening of the gastric wall and residual tumor is not excluded for example on images 60-62 of series 2. There are dilated loops of small bowel anteriorly measuring up to 4.2 cm in diameter, without a well-defined lead point for obstruction. Cannot exclude wall thickening in the nondistended ascending colon and hepatic flexure. Vascular/Lymphatic:  Aortoiliac atherosclerotic vascular  disease. 1 no well-defined adenopathy. Reproductive: Marked prostatomegaly. Other: Diffuse low-grade subcutaneous and mesenteric edema. Trace ascites in the pelvis. Musculoskeletal: Unremarkable IMPRESSION: 1. The small hypodense lesion in segment 4 of the liver was previously hypermetabolic, and is subjectively reduced in size compared to prior PET-CT. 2. Dilated loops of small bowel anteriorly without a well-defined lead point for obstruction. This could be from ileus or low-grade partial small bowel obstruction. 3. Continued gastric wall thickening is not entirely specific but residual gastric tumor is not excluded. Postoperative findings along the residual stomach. 4. Cannot exclude wall thickening in the nondistended ascending colon and hepatic flexure. 5. Diffuse low-grade subcutaneous and mesenteric edema. Trace ascites in the pelvis. Appearance favors third spacing of fluid. 6. Bilateral airway thickening with airway plugging in both lower lobes and resulting airspace opacities due to atelectasis and possibly pneumonia in the lower lobes. 7. Marked prostatomegaly. 8. Stable 4.2 cm ascending thoracic aortic aneurysm. 9. Aortic atherosclerosis. Aortic Atherosclerosis (ICD10-I70.0). Electronically Signed   By: Van Clines M.D.   On: 03/14/2020 17:55   DG Abd 2 Views  Result Date: 03/29/2020 CLINICAL DATA:  Lower abdominal pain. History of small-bowel obstruction. EXAM: ABDOMEN - 2 VIEW COMPARISON:  CT abdomen and pelvis 03/28/2020. Abdominal radiographs 03/28/2020. FINDINGS: There is no evidence of intraperitoneal free air. A moderate amount of stool is again seen throughout the colon. There are multiple loops of mildly to moderately dilated small bowel in the mid and lower abdomen which were not apparent on yesterday's radiographs but are similar in caliber to the interval CT. Sutures are noted in the left upper quadrant related to prior partial gastrectomy.  Excreted IV contrast is noted in the bladder. Mild left basilar lung opacity likely reflects atelectasis. No acute osseous abnormality is seen. IMPRESSION: Persistent small bowel dilatation which may reflect obstruction or ileus. Electronically Signed   By: Logan Bores M.D.   On: 03/29/2020 09:38   DG Abdomen Acute W/Chest  Result Date: 03/28/2020 CLINICAL DATA:  Abdominal pain.  Fatigue.  Gastric cancer. EXAM: DG ABDOMEN ACUTE WITH 1 VIEW CHEST COMPARISON:  03/14/2020 CT FINDINGS: Left Port-A-Cath tip at low SVC. Hyperinflation. Remote bilateral rib fractures. midline trachea. Normal heart size. No pleural effusion or pneumothorax. Clear lungs. The upright view demonstrates no free intraperitoneal air or significant air-fluid levels. The supine view demonstrates contrast within normal caliber colon. No gaseous distention of bowel loops. Surgical sutures likely of partial gastrectomy. Aortic atherosclerosis. IMPRESSION: No acute findings. Hyperinflation, suggesting COPD. Electronically Signed   By: Abigail Miyamoto M.D.   On: 03/28/2020 15:55    ASSESSMENT AND PLAN: 1.  Metastatic gastric cancer 2.  Bowel obstruction versus severe ileus 3.  Anemia 4.  Weight loss/poor appetite 5.  Hypertension 6.  Hyperlipidemia 7.  BPH  -The patient has metastatic gastric cancer and chemotherapy has been placed on hold due to declining performance status.  He has continued to have persistent symptoms including abdominal pain, nausea, vomiting.  Was seen in our office earlier this week and admission was recommended, but the patient declined. -General surgery for bowel obstruction versus ileus has seen the patient and recommends medical management. -GI evaluation performed this morning and plan for EGD today. -Hemoglobin 8.6 today.  Recommend close monitoring and transfuse for hemoglobin less than 8. -Recommend dietitian consult for weight loss and poor appetite   LOS: 0 days   Gabriel Bussing, Gabriel Poole, Gabriel Poole,  Gabriel Poole 03/29/20  Addendum  I have seen the patient, examined him. I agree  with the assessment and and plan and have edited the notes.   Gabriel Poole feels a lot better today, denies any abdominal pain, no nausea or vomiting. He said he had a piece of cake and tolerated well. His abdomen is not tender on exam today. I reviewed his EGD report. I appreciate GI and surgical team's input.   I am still not sure what caused his significant ileus, I do not think this is SBO. Although atypical, I assume this is probably related to chemo or immunotherapy. I will likely hold on his Keytruda and change his chemo to more tolerable regimen such as Xeloda or 5-fu and Herceptin, when he recovers well.   I appreciate the hospitalist team's excellent care. I will follow up on Monday if he is still in hospital.   Truitt Merle  03/29/2020

## 2020-03-29 NOTE — Anesthesia Postprocedure Evaluation (Signed)
Anesthesia Post Note  Patient: Gabriel Poole  Procedure(s) Performed: ESOPHAGOGASTRODUODENOSCOPY (EGD) WITH PROPOFOL (N/A ) FOREIGN BODY REMOVAL     Patient location during evaluation: Endoscopy Anesthesia Type: MAC Level of consciousness: awake Pain management: pain level controlled Vital Signs Assessment: post-procedure vital signs reviewed and stable Respiratory status: spontaneous breathing Cardiovascular status: stable Postop Assessment: no apparent nausea or vomiting Anesthetic complications: no   No complications documented.  Last Vitals:  Vitals:   03/29/20 1154 03/29/20 1202  BP: (!) 178/63 (!) 178/61  Pulse: (!) 44 91  Resp: 20 (!) 26  Temp:    SpO2: 100% 99%    Last Pain:  Vitals:   03/29/20 1202  TempSrc:   PainSc: 0-No pain                 Huston Foley

## 2020-03-29 NOTE — Transfer of Care (Signed)
Immediate Anesthesia Transfer of Care Note  Patient: Gabriel Poole  Procedure(s) Performed: Procedure(s): ESOPHAGOGASTRODUODENOSCOPY (EGD) WITH PROPOFOL (N/A) FOREIGN BODY REMOVAL  Patient Location: PACU  Anesthesia Type:MAC  Level of Consciousness:  sedated, patient cooperative and responds to stimulation  Airway & Oxygen Therapy:Patient Spontanous Breathing and Patient connected to face mask oxgen  Post-op Assessment:  Report given to PACU RN and Post -op Vital signs reviewed and stable  Post vital signs:  Reviewed and stable  Last Vitals:  Vitals:   03/29/20 1050 03/29/20 1053  BP:  (!) 184/78  Pulse:  (!) 53  Resp:  (!) 22  Temp: 36.9 C   SpO2:  010%    Complications: No apparent anesthesia complications

## 2020-03-29 NOTE — Consult Note (Signed)
Surgical Evaluation Requesting provider: Dr. Tana Coast  Chief Complaint: abdominal pain, emesis  HPI: 74 year old man with a history of gastric cancer s/p distal gastrectomy/omentectomy with billroth II reconstruction and J tube (since removed) by Dr. Barry Dienes in December 2018. Recurrent disease noted on upper endoscopy by Dr. Laural Golden in June 2021. 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  Followed by Dr. Burr Medico on started FOLFOX in July.  Last week presented for follow-up with oncology and noted more nausea and vomiting.  Continued abdominal pain which is chronic.  Restaging scan earlier this month showed decreased size of the liver lesion but also showed dilated loops of small bowel without obstruction concerning for ileus.  He has been having chest congestion low-grade temperatures, lower food intake and increased fatigue.  He was started on steroids to treat any chemo related bowel inflammation contributing to his nausea and vomiting and abdominal pain.  He was started on Levaquin to treat a suspected pneumonia.  His planned chemo for last week was held. His symptoms did not improve and when he was reevaluated a couple days ago he was given fluids he did feel little bit better but was ultimately directed to be admitted.  Repeat CT scan shows dilated loops of small bowel concerning for ileus versus obstruction.  General surgery is asked to evaluate.  He does note chronic constipation, states his last bowel movement was at least a week ago.  He does require chronic narcotics for his cancer associated abdominal pain.  Allergies  Allergen Reactions  . Penicillins Itching and Other (See Comments)     patient had a PCN reaction causing immediate rash, facial/tongue/throat swelling, SOB or lightheadedness with hypotension: Unknown Has patient had a PCN reaction causing severe rash involving mucus membranes or skin necrosis: Unknown Has patient had a PCN reaction that required  hospitalization: Unknown Has patient had a PCN reaction occurring within the last 10 years: No If all of the above answers are "NO", then may proceed with Cephalosporin use. Teenager*     Past Medical History:  Diagnosis Date  . Arthritis   . Cancer [facility]) dx oct 02-2017   stomach  . Gout   . Heart murmur   . Hyperlipidemia   . Hypertension   . Stomach cancer [facility])     Past Surgical History:  Procedure Laterality Date  . BIOPSY  11/02/2019   Procedure: BIOPSY;  Surgeon: Rogene Houston, MD;  [facility];  Service: Endoscopy;;  gastric  . COLONOSCOPY    . COLONOSCOPY N/A 11/02/2019   Procedure: COLONOSCOPY;  Surgeon: Rogene Houston, MD;  [facility];  Service: Endoscopy;  Laterality: N/A;  100  . ESOPHAGOGASTRODUODENOSCOPY N/A 02/17/2017   Procedure: ESOPHAGOGASTRODUODENOSCOPY (EGD);  Surgeon: Rogene Houston, MD;  [facility];  Service: Endoscopy;  Laterality: N/A;  3:00  . ESOPHAGOGASTRODUODENOSCOPY N/A 11/02/2019   Procedure: ESOPHAGOGASTRODUODENOSCOPY (EGD);  Surgeon: Rogene Houston, MD;  [facility];  Service: Endoscopy;  Laterality: N/A;  . EUS N/A 03/11/2017   Procedure: UPPER ENDOSCOPIC ULTRASOUND (EUS) LINEAR;  Surgeon: Milus Banister, MD;  [facility];  Service: Endoscopy;  Laterality: N/A;  . EUS N/A 03/11/2017   Procedure: UPPER ENDOSCOPIC ULTRASOUND (EUS) RADIAL;  Surgeon: Milus Banister, MD;  [facility];  Service: Endoscopy;  Laterality: N/A;  . FINGER SURGERY     Lt middle   . GASTRECTOMY N/A 04/22/2017   Procedure: PARTIAL GASTRECTOMY;  Surgeon: Stark Klein, MD;  Location: [place];  Service: General;  Laterality: N/A;  . GASTROJEJUNOSTOMY N/A 04/22/2017   Procedure: JEJUNAL FEEDING TUBE PLACEMENT;  Surgeon: Stark Klein, MD;  Location: [place];  Service: General;  Laterality: N/A;  . HYDROCELE EXCISION  02/12/2011   Procedure: HYDROCELECTOMY ADULT;  Surgeon: Marissa Nestle;  [facility];  Service: Urology;  Laterality: Left;  . LAPAROSCOPY N/A 04/22/2017   Procedure: LAPAROSCOPY DIAGNOSTIC ERAS PATHWAY;  Surgeon: Stark Klein, MD;  Location: [place];  Service: General;  Laterality: N/A;  EPIDURAL  . PORTACATH PLACEMENT N/A 05/28/2017   Procedure: INSERTION PORT-A-CATH ERAS PATHWAY;  Surgeon: Stark Klein, MD;  Location: [place];  Service: General;  Laterality: N/A;    Family History  Problem Relation Age of Onset  . Hypertension Mother   . Heart attack Father   . Cancer Maternal Aunt        leukemia  . Cancer Maternal Aunt        leukemia  . Hypotension Neg Hx   . Anesthesia problems Neg Hx   . Malignant hyperthermia Neg Hx   . Pseudochol deficiency Neg Hx     Social History   Socioeconomic History  . Marital status: Divorced    Spouse name: Not on file  . Number of children: Not on file  . Years of education: Not on file  . Highest education level: Not on file  Occupational History  . Occupation: works at [employer]  . Smoking status: Former Smoker    Packs/day: 0.50    Years: 45.00    Pack years: 22.50    Types: Cigarettes, Cigars    Quit date: 04/2017    Years since quitting: 2.9  . Smokeless tobacco: Never Used  . Tobacco comment: used to smoke 1 pack a day   Vaping Use  . Vaping Use: Never used  Substance and Sexual Activity  . Alcohol use: Not Currently    Alcohol/week: 0.0 standard drinks    Comment: Brandy daily, stopped in Dec 2018  . Drug use: No  . Sexual activity: Yes  Other Topics Concern  . Not on file  Social History Narrative  . Not on file   Social Determinants of Health   Financial Resource Strain:   . Difficulty of Paying Living Expenses: Not on file  Food Insecurity:   . Worried About Charity fundraiser in the Last Year: Not on file  . Ran Out of Food in the Last Year: Not on file  Transportation Needs:   . Lack of Transportation (Medical): Not on file  . Lack of Transportation  (Non-Medical): Not on file  Physical Activity:   . Days of Exercise per Week: Not on file  . Minutes of Exercise per Session: Not on file  Stress:   . Feeling of Stress : Not on file  Social Connections:   . Frequency of Communication with Friends and Family: Not on file  . Frequency of Social Gatherings with Friends and Family: Not on file  . Attends Religious Services: Not on file  . Active Member of Clubs or Organizations: Not on file  . Attends Archivist Meetings: Not on file  . Marital Status: Not on file    No current facility-administered medications on file prior to encounter.   Current Outpatient Medications on File Prior to Encounter  Medication Sig Dispense Refill  . amLODipine (NORVASC) 10 MG tablet Take 1 tablet (10  mg total) by mouth daily. 90 tablet 0  . atorvastatin (LIPITOR) 40 MG tablet Take 1 tablet (40 mg total) by mouth daily at 6 PM. 90 tablet 0  . Budesonide ER 9 MG CP24 Take 9 mg by mouth daily. 14 capsule 0  . Cholecalciferol (VITAMIN D3) 5000 units CAPS Take 5,000 Units by mouth daily.    . finasteride (PROSCAR) 5 MG tablet Take 5 mg by mouth daily.    Marland Kitchen HYDROcodone-acetaminophen (NORCO/VICODIN) 5-325 MG tablet TAKE 1 TABLET EVERY 6 HOURS AS NEEDED FOR MODERATE PAIN (Patient taking differently: Take 1 tablet by mouth every 6 (six) hours as needed for moderate pain or severe pain. ) 60 tablet 0  . hydrOXYzine (ATARAX/VISTARIL) 50 MG tablet Take 50 mg by mouth every 6 (six) hours as needed for anxiety.    Marland Kitchen levofloxacin (LEVAQUIN) 750 MG tablet Take 1 tablet (750 mg total) by mouth daily. 7 tablet 0  . lidocaine-prilocaine (EMLA) cream Apply to affected area once 30 g 3  . megestrol (MEGACE ES) 625 MG/5ML suspension Take 5 mLs (625 mg total) by mouth daily. 150 mL 0  . ondansetron (ZOFRAN) 8 MG tablet Take 1 tablet (8 mg total) by mouth 2 (two) times daily as needed for refractory nausea / vomiting. Start on day 3 after chemotherapy. 30 tablet 1  .  pantoprazole (PROTONIX) 40 MG tablet Take 1 tablet (40 mg total) by mouth daily. 30 tablet 2  . potassium chloride SA (K-DUR,KLOR-CON) 20 MEQ tablet Take 1 tablet (20 mEq total) by mouth 2 (two) times daily. 40 tablet 1  . prochlorperazine (COMPAZINE) 10 MG tablet Take 1 tablet (10 mg total) by mouth every 6 (six) hours as needed (Nausea or vomiting). 30 tablet 1  . tadalafil (CIALIS) 5 MG tablet Take 5 mg by mouth daily.    . tamsulosin (FLOMAX) 0.4 MG CAPS capsule Take 0.4 mg by mouth daily.    Marland Kitchen triamterene-hydrochlorothiazide (MAXZIDE-25) 37.5-25 MG tablet Take 1 tablet by mouth daily.      Review of Systems: a complete, 10pt review of systems was completed with pertinent positives and negatives as documented in the HPI  Physical Exam: Vitals:   03/28/20 2105 03/29/20 0530  BP: 138/84 (!) 152/79  Pulse: (!) 101 (!) 50  Resp: 18 16  Temp: 98.2 F (36.8 C) 97.7 F (36.5 C)  SpO2: 100% 99%   Gen: A&Ox3, no distress, cachectic  Eyes: lids and conjunctivae normal, no icterus. Pupils equally round and reactive to light.  Neck: supple without mass or thyromegaly Chest: respiratory effort is normal. No crepitus or tenderness on palpation of the chest. Coarse cough.  Cardiovascular: RRR with palpable distal pulses, no pedal edema Gastrointestinal: soft, mildly distended, nontender. No mass, hepatomegaly or splenomegaly. Well healed upper midline scar. Lymphatic: no lymphadenopathy in the neck or groin Muscoloskeletal: no clubbing or cyanosis of the fingers.  Strength is symmetrical throughout.  Range of motion of bilateral upper and lower extremities normal without pain, crepitation or contracture. Neuro: cranial nerves grossly intact.  Sensation intact to light touch diffusely. Psych: appropriate mood and affect, normal insight/judgment intact  Skin: warm and dry   CBC Latest Ref Rng & Units 03/29/2020 03/28/2020 03/27/2020  WBC 4.0 - 10.5 K/uL 8.0 10.8(H) 6.6  Hemoglobin 13.0 - 17.0  g/dL 8.6(L) 10.6(L) 9.4(L)  Hematocrit 39 - 52 % 27.9(L) 34.7(L) 30.2(L)  Platelets 150 - 400 K/uL 286 319 289    CMP Latest Ref Rng & Units 03/29/2020 03/28/2020 03/27/2020  Glucose 70 - 99 mg/dL 82 107(H) 84  BUN 8 - 23 mg/dL 38(H) 40(H) 43(H)  Creatinine 0.61 - 1.24 mg/dL 1.11 1.21 1.53(H)  Sodium 135 - 145 mmol/L 137 141 139  Potassium 3.5 - 5.1 mmol/L 3.8 4.2 3.8  Chloride 98 - 111 mmol/L 111 108 106  CO2 22 - 32 mmol/L 20(L) 22 26  Calcium 8.9 - 10.3 mg/dL 8.4(L) 9.2 8.8(L)  Total Protein 6.5 - 8.1 g/dL 5.0(L) 5.8(L) 5.5(L)  Total Bilirubin 0.3 - 1.2 mg/dL 0.8 0.8 0.6  Alkaline Phos 38 - 126 U/L 72 89 76  AST 15 - 41 U/L 18 27 21   ALT 0 - 44 U/L 13 17 8     Lab Results  Component Value Date   INR 1.2 12/14/2019   INR 1.10 04/23/2017   INR 1.00 04/16/2017    Imaging: CT ABDOMEN PELVIS W CONTRAST  Addendum Date: 03/28/2020   ADDENDUM REPORT: 03/28/2020 19:32 ADDENDUM: Findings conveyed topatient floor nurse, Adrianna on 03/28/2020 at19:31. Electronically Signed   By: Suzy Bouchard M.D.   On: 03/28/2020 19:32   Result Date: 03/28/2020 CLINICAL DATA:  Abdominal pain.  History of stomach cancer EXAM: CT ABDOMEN AND PELVIS WITH CONTRAST TECHNIQUE: Multidetector CT imaging of the abdomen and pelvis was performed using the standard protocol following bolus administration of intravenous contrast. CONTRAST:  116mL OMNIPAQUE IOHEXOL 300 MG/ML  SOLN COMPARISON:  CT 03/14/2020 FINDINGS: Lower chest: Nodular pleuroparenchymal thickening in the LEFT lung base slightly improved. No pleural fluid. Hepatobiliary: Small low-density lesion in the central LEFT hepatic lobe is again demonstrated. Gallbladder is mildly distended which may relate to reported fasting state. Common bile duct is prominent. Findings similar to prior. Pancreas: Pancreas is normal. No ductal dilatation. No pancreatic inflammation. Spleen: Normal spleen Adrenals/urinary tract: Adrenal glands normal. Large nonenhancing  cysts within the kidneys. Bladder normal. Stomach/Bowel: There is increased fluid in the stomach compared to prior. Post gastric surgery with apparent Roux-en-Y anatomy. The proximal small bowel is dilated significantly at the level of the gastric enteric anastomosis this dilated loop of bowel is fluid-filled measuring 5 cm. More distally the small bowel is diffusely dilated and fluid-filled. The bowel in the RIGHT lower quadrant measures 4.6 cm compared to 4.4 cm on recent CT. There is no transition point identified the small bowel. Oral contrast from the CT 03/15/2019 remains in the descending colon sigmoid colon. No pneumatosis of the small bowel. No portal venous gas. No intraperitoneal free air. Vascular/Lymphatic: Abdominal aorta is normal caliber with atherosclerotic calcification. There is no retroperitoneal or periportal lymphadenopathy. No pelvic lymphadenopathy. Reproductive: Prostate enlarged to 5.8 cm Other: There is anasarca of the soft tissues there is little intra-abdominal fat which makes evaluation of the peritoneal contents difficult. Musculoskeletal: No aggressive osseous lesion. IMPRESSION: 1. Fluid-filled stomach and proximal small bowel. Persistent distension of the fluid-filled small bowel to 4.8 cm compared to 4.6 cm on 03/15/2019. Findings consistent with small bowel obstruction versus severe ileus. No pneumatosis or portal venous gas. No intraperitoneal free air. 2. Oral contrast from 03/24/2017 remains in the LEFT colon. 3. Post partial gastrectomy with Roux-en-Y anatomy. Electronically Signed: By: Suzy Bouchard M.D. On: 03/28/2020 19:19   DG Abd 2 Views  Result Date: 03/29/2020 CLINICAL DATA:  Lower abdominal pain. History of small-bowel obstruction. EXAM: ABDOMEN - 2 VIEW COMPARISON:  CT abdomen and pelvis 03/28/2020. Abdominal radiographs 03/28/2020. FINDINGS: There is no evidence of intraperitoneal free air. A moderate amount of stool is again seen throughout the colon.  There  are multiple loops of mildly to moderately dilated small bowel in the mid and lower abdomen which were not apparent on yesterday's radiographs but are similar in caliber to the interval CT. Sutures are noted in the left upper quadrant related to prior partial gastrectomy. Excreted IV contrast is noted in the bladder. Mild left basilar lung opacity likely reflects atelectasis. No acute osseous abnormality is seen. IMPRESSION: Persistent small bowel dilatation which may reflect obstruction or ileus. Electronically Signed   By: Logan Bores M.D.   On: 03/29/2020 09:38   DG Abdomen Acute W/Chest  Result Date: 03/28/2020 CLINICAL DATA:  Abdominal pain.  Fatigue.  Gastric cancer. EXAM: DG ABDOMEN ACUTE WITH 1 VIEW CHEST COMPARISON:  03/14/2020 CT FINDINGS: Left Port-A-Cath tip at low SVC. Hyperinflation. Remote bilateral rib fractures. midline trachea. Normal heart size. No pleural effusion or pneumothorax. Clear lungs. The upright view demonstrates no free intraperitoneal air or significant air-fluid levels. The supine view demonstrates contrast within normal caliber colon. No gaseous distention of bowel loops. Surgical sutures likely of partial gastrectomy. Aortic atherosclerosis. IMPRESSION: No acute findings. Hyperinflation, suggesting COPD. Electronically Signed   By: Abigail Miyamoto M.D.   On: 03/28/2020 15:55     A/P: 74 year old man with recurrent and now metastatic gastric cancer presenting with abdominal pain, nausea and vomiting.  CT with diffusely dilated small bowel.  Suspect that this is likely ileus related to pneumonia, malignancy, chemotherapy and narcotic use.  He is currently denying any nausea or pain and is hoping not to stay in the hospital for too long. Okay to try an enema to assist with constipation which may help some, GI is evaluating but given he had an endoscopy and has known recurrence I am not sure that there is anything more that can be done from that end.  If nausea returns would  recommend trial of NG tube decompression and small bowel obstruction protocol.  Surgery will follow.    Patient Active Problem List   Diagnosis Date Noted  . Abdominal pain 03/28/2020  . Goals of care, counseling/discussion 11/24/2019  . Iron deficiency anemia due to chronic blood loss 11/22/2019  . 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 Port-A-Cath in place 06/02/2017  . Malignant neoplasm of body of stomach ([place])   . Abdominal pain, epigastric 02/01/2017  . 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 Pain of right lower leg 11/29/2014  . Elevated PSA, less than 10 ng/ml 10/31/2014  . Noncompliance 09/21/2012  . HTN (hypertension) 09/21/2012  . HLD (hyperlipidemia) 09/21/2012  . Gout 09/21/2012  . Hydrocele 12/17/2010  . Tobacco abuse 12/17/2010       Romana Juniper, MD [facility]  See AMION to contact appropriate on-call provider

## 2020-03-29 NOTE — Telephone Encounter (Signed)
For your approval or refusal.  Looks like it was refilled recently. Gardiner Rhyme, RN

## 2020-03-29 NOTE — TOC Initial Note (Signed)
Transition of Care [facility]) - Initial/Assessment Note    Patient Details  Name: Gabriel Poole MRN: [id number] Date of Birth: 02-Aug-1945  Transition of [facility]) CM/SW Contact:    Lennart Pall, LCSW Phone Number: 03/29/2020, 1:39 PM  Clinical Narrative:    Met with pt to introduce TOC role and review potential dc needs.  Pt very pleasant and is hopeful to dc home soon.  He does have a friend who lives in the home with him and several family members living in the immediate area.  Pt denies any concerns or needs for d/c.  Agreeable for TOC to follow until dc.                 Expected Discharge Plan: Home/Self Care Barriers to Discharge: Continued Medical Work up   Patient Goals and CMS Choice Patient states their goals for this hospitalization and ongoing recovery are:: go home      Expected Discharge Plan and Services Expected Discharge Plan: Home/Self Care In-house Referral: Clinical Social Work     Living arrangements for the past 2 months: Single Family Home                                      Prior Living Arrangements/Services Living arrangements for the past 2 months: Single Family Home Lives with:: Roommate Patient language and need for interpreter reviewed:: Yes Do you feel safe going back to the place where you live?: Yes      Need for Family Participation in Patient Care: Yes (Comment) (TBD) Care giver support system in place?: Yes (comment)   Criminal Activity/Legal Involvement Pertinent to Current Situation/Hospitalization: No - Comment as needed  Activities of Daily Living Home Assistive Devices/Equipment: None ADL Screening (condition at time of admission) Patient's cognitive ability adequate to safely complete daily activities?: Yes Is the patient deaf or have difficulty hearing?: No Does the patient have difficulty seeing, even when wearing glasses/contacts?: No Does the patient have difficulty concentrating, remembering, or making decisions?:  Yes (very groggy while doing history) Patient able to express need for assistance with ADLs?: Yes Does the patient have difficulty dressing or bathing?: No Independently performs ADLs?: Yes (appropriate for developmental age) Does the patient have difficulty walking or climbing stairs?: Yes Weakness of Legs: Both Weakness of Arms/Hands: Both  Permission Sought/Granted                  Emotional Assessment Appearance:: Appears stated age Attitude/Demeanor/Rapport: Gracious, Engaged Affect (typically observed): Accepting, Pleasant Orientation: : Oriented to Self, Oriented to Place, Oriented to  Time, Oriented to Situation Alcohol / Substance Use: Not Applicable Psych Involvement: No (comment)  Admission diagnosis:  Abdominal pain [R10.9] Abdominal pain, unspecified abdominal location [R10.9] SBO (small bowel obstruction) ([place]) [K56.609] Patient Active Problem List   Diagnosis Date Noted  . SBO (small bowel obstruction) ([place]) 03/29/2020  . Vomiting   . Abdominal pain 03/28/2020  . Goals of care, counseling/discussion 11/24/2019  . Iron deficiency anemia due to chronic blood loss 11/22/2019  . 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 Port-A-Cath in place 06/02/2017  . Malignant neoplasm of body of stomach ([place])   . Abdominal pain, epigastric 02/01/2017  . 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 Pain of right lower leg 11/29/2014  . Elevated PSA, less than 10 ng/ml 10/31/2014  . Noncompliance 09/21/2012  . HTN (hypertension) 09/21/2012  .  HLD (hyperlipidemia) 09/21/2012  . Gout 09/21/2012  . Hydrocele 12/17/2010  . Tobacco abuse 12/17/2010   PCP:  Wannetta Sender, FNP Pharmacy:   [place], [place] [address] Phone: [phone] Fax: [place], [address] [place] [address] Phone:  [phone] Fax: [phone]  [place], [place] [address] [place] [address] Phone: [phone] Fax: [phone]     Social Determinants of Health (SDOH) Interventions    Readmission Risk Interventions No flowsheet data found.

## 2020-03-29 NOTE — Op Note (Addendum)
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 Estimated Blood Loss:     Estimated blood loss was minimal. Procedure:                Pre-Anesthesia Assessment:                           - Prior to the procedure, a History and Physical                            was performed, and patient medications and                            allergies were reviewed. The patient's tolerance of                            previous anesthesia was also reviewed. The risks                            and benefits of the procedure and the sedation                            options and risks were discussed with the patient.                            All questions were answered, and informed consent  was obtained. Prior Anticoagulants: The patient                            last took Lovenox (enoxaparin) 1 day prior to the                            procedure. ASA Grade Assessment: IV - A patient                            with severe systemic disease that is a constant                            threat to life. After reviewing the risks and                             benefits, the patient was deemed in satisfactory                            condition to undergo the procedure.                           After obtaining informed consent, the endoscope was                            passed under direct vision. Throughout the                            procedure, the patient's blood pressure, pulse, and                            oxygen saturations were monitored continuously. The                            GIF-H190 ([id number]) Olympus gastroscope was                            introduced through the mouth, and advanced to the                            gastro jejunal anastomosis. The upper GI endoscopy                            was technically difficult and complex due to                            post-surgical anatomy, presence of bezoar, stenosis                            and a partially obstructing mass. The patient                            tolerated the procedure well. Scope In: Scope Out: Findings:      LA Grade D (one or more mucosal breaks involving at  least 75% of       esophageal circumference) esophagitis with no bleeding was found 30 to       38 cm from the incisors.      Diffuse severely friable mucosa with spontaneous bleeding was found in       the gastric fundus and in the gastric body, consistent with history of       know recurrent gastric adenocarcinoma      Evidence of a stenosed gastrojejunostomy was found. The gastrojejunal       anastomosis was characterized by friable mucosa and severe stenosis,       consistent with history of know recurrent gastric adenocarcinoma. This       was not traversed. Cannot evaluate the extent of stenosis or tumor       extension.      A medium amount of a phytobezoar was found in the gastric body. Removal       of food was accomplished with roth net. Impression:               - LA Grade D reflux and erosive esophagitis with no                            bleeding.                            - Friable gastric mucosa.                           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                           - A medium amount of a phytobezoar in the stomach.                            Removal was successful. Moderate Sedation:      Not Applicable - Patient had care per Anesthesia. Recommendation:           - Clear liquid diet                           - Small frequent meals                           - Continue present medications.                           - Follow an antireflux regimen.                           - Use sucralfate suspension 1 gram PO QID.                           - Use Pepcid (famotidine) 20 mg PO BID.                           -  Obtain upper GI series to determine the extent of                            narrowing, if no other significant stenosis, can                            advance diet as tolerated to low residue soft diet                           - Follow up with Oncology                           - Will need to discuss goals of care                           - GI is available if needed during this weekend,                            please call with any questions Procedure Code(s):        --- Professional ---                           [phone], Esophagogastroduodenoscopy, flexible,                            transoral; with removal of foreign body(s) Diagnosis Code(s):        --- Professional ---                           K21.00, Gastro-esophageal reflux disease with                            esophagitis, without bleeding                           K20.80, Other esophagitis without bleeding                           K31.89, Other diseases of stomach and duodenum                           Z98.0, Intestinal bypass and anastomosis status                           T18.2XXA, Foreign body in stomach, initial encounter                           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 All rights reserved. The codes documented in this report are preliminary and upon coder review may  be revised to meet current compliance requirements. Mauri Pole, MD 03/29/2020 11:53:27 AM This report has been signed electronically. Number of Addenda: 0

## 2020-03-30 DIAGNOSIS — Z7189 Other specified counseling: Secondary | ICD-10-CM

## 2020-03-30 DIAGNOSIS — R109 Unspecified abdominal pain: Secondary | ICD-10-CM | POA: Diagnosis not present

## 2020-03-30 DIAGNOSIS — R1010 Upper abdominal pain, unspecified: Secondary | ICD-10-CM | POA: Diagnosis not present

## 2020-03-30 DIAGNOSIS — E7800 Pure hypercholesterolemia, unspecified: Secondary | ICD-10-CM

## 2020-03-30 DIAGNOSIS — K56609 Unspecified intestinal obstruction, unspecified as to partial versus complete obstruction: Secondary | ICD-10-CM | POA: Diagnosis not present

## 2020-03-30 MED ORDER — SENNOSIDES-DOCUSATE SODIUM 8.6-50 MG PO TABS
1.0000 | ORAL_TABLET | Freq: Two times a day (BID) | ORAL | 3 refills | Status: DC
Start: 2020-03-30 — End: 2020-04-23

## 2020-03-30 MED ORDER — POLYETHYLENE GLYCOL 3350 17 G PO PACK
17.0000 g | PACK | Freq: Every day | ORAL | 3 refills | Status: DC
Start: 2020-03-30 — End: 2020-04-23

## 2020-03-30 MED ORDER — SENNOSIDES-DOCUSATE SODIUM 8.6-50 MG PO TABS
1.0000 | ORAL_TABLET | Freq: Two times a day (BID) | ORAL | Status: DC
  Filled 2020-03-30: qty 1

## 2020-03-30 MED ORDER — PROCHLORPERAZINE MALEATE 10 MG PO TABS: 10.0000 mg | ORAL_TABLET | Freq: Four times a day (QID) | ORAL | 1 refills | Status: DC | PRN

## 2020-03-30 MED ORDER — FAMOTIDINE 20 MG PO TABS: 20.0000 mg | ORAL_TABLET | Freq: Two times a day (BID) | ORAL | 1 refills | Status: DC

## 2020-03-30 MED ORDER — BISACODYL 10 MG RE SUPP
10.0000 mg | Freq: Once | RECTAL | Status: AC
  Administered 2020-03-30: 09:00:00 10 mg via RECTAL
  Filled 2020-03-30: qty 1

## 2020-03-30 MED ORDER — SUCRALFATE 1 G PO TABS: 1.0000 g | ORAL_TABLET | Freq: Three times a day (TID) | ORAL | 1 refills | Status: DC

## 2020-03-30 MED ORDER — HEPARIN SOD (PORK) LOCK FLUSH 100 UNIT/ML IV SOLN
500.0000 [IU] | INTRAVENOUS | Status: DC | PRN
  Filled 2020-03-30: qty 5

## 2020-03-30 MED ORDER — ALTEPLASE 2 MG IJ SOLR
2.0000 mg | Freq: Once | INTRAMUSCULAR | Status: DC
  Filled 2020-03-30: qty 2

## 2020-03-30 MED ORDER — POLYETHYLENE GLYCOL 3350 17 G PO PACK
17.0000 g | PACK | Freq: Every day | ORAL | Status: DC
  Filled 2020-03-30: qty 1

## 2020-03-30 NOTE — Progress Notes (Signed)
1 Day Post-Op   Subjective/Chief Complaint: No n/v, tol liquids, had 3 bms overnight, no abd pain   Objective: Vital signs in last 24 hours: Temp:  [98.1 F (36.7 C)-98.6 F (37 C)] 98.1 F (36.7 C) (11/20 0556) Pulse Rate:  [44-96] 57 (11/20 0556) Resp:  [14-26] 14 (11/20 0556) BP: (148-189)/(61-82) 154/79 (11/20 0556) SpO2:  [99 %-100 %] 100 % (11/20 0556) Weight:  [52.2 kg] 52.2 kg (11/19 1053) Last BM Date: 03/27/20  Intake/Output from previous day: 11/19 0701 - 11/20 0700 In: 2430.7 [P.O.:600; I.V.:1830.7] Out: 925 [Urine:925] Intake/Output this shift: No intake/output data recorded.  GI: nontender nondistended soft  Lab Results:  Recent Labs    03/28/20 1419 03/29/20 0553  WBC 10.8* 8.0  HGB 10.6* 8.6*  HCT 34.7* 27.9*  PLT 319 286   BMET Recent Labs    03/28/20 1419 03/29/20 0553  NA 141 137  K 4.2 3.8  CL 108 111  CO2 22 20*  GLUCOSE 107* 82  BUN 40* 38*  CREATININE 1.21 1.11  CALCIUM 9.2 8.4*   PT/INR No results for input(s): LABPROT, INR in the last 72 hours. ABG No results for input(s): PHART, HCO3 in the last 72 hours.  Invalid input(s): PCO2, PO2  Studies/Results: CT ABDOMEN PELVIS W CONTRAST  Addendum Date: 03/28/2020   ADDENDUM REPORT: 03/28/2020 19:32 ADDENDUM: Findings conveyed topatient floor nurse, Adrianna on 03/28/2020 at19:31. Electronically Signed   By: Suzy Bouchard M.D.   On: 03/28/2020 19:32   Result Date: 03/28/2020 CLINICAL DATA:  Abdominal pain.  History of stomach cancer EXAM: CT ABDOMEN AND PELVIS WITH CONTRAST TECHNIQUE: Multidetector CT imaging of the abdomen and pelvis was performed using the standard protocol following bolus administration of intravenous contrast. CONTRAST:  119mL OMNIPAQUE IOHEXOL 300 MG/ML  SOLN COMPARISON:  CT 03/14/2020 FINDINGS: Lower chest: Nodular pleuroparenchymal thickening in the LEFT lung base slightly improved. No pleural fluid. Hepatobiliary: Small low-density lesion in the central  LEFT hepatic lobe is again demonstrated. Gallbladder is mildly distended which may relate to reported fasting state. Common bile duct is prominent. Findings similar to prior. Pancreas: Pancreas is normal. No ductal dilatation. No pancreatic inflammation. Spleen: Normal spleen Adrenals/urinary tract: Adrenal glands normal. Large nonenhancing cysts within the kidneys. Bladder normal. Stomach/Bowel: There is increased fluid in the stomach compared to prior. Post gastric surgery with apparent Roux-en-Y anatomy. The proximal small bowel is dilated significantly at the level of the gastric enteric anastomosis this dilated loop of bowel is fluid-filled measuring 5 cm. More distally the small bowel is diffusely dilated and fluid-filled. The bowel in the RIGHT lower quadrant measures 4.6 cm compared to 4.4 cm on recent CT. There is no transition point identified the small bowel. Oral contrast from the CT 03/15/2019 remains in the descending colon sigmoid colon. No pneumatosis of the small bowel. No portal venous gas. No intraperitoneal free air. Vascular/Lymphatic: Abdominal aorta is normal caliber with atherosclerotic calcification. There is no retroperitoneal or periportal lymphadenopathy. No pelvic lymphadenopathy. Reproductive: Prostate enlarged to 5.8 cm Other: There is anasarca of the soft tissues there is little intra-abdominal fat which makes evaluation of the peritoneal contents difficult. Musculoskeletal: No aggressive osseous lesion. IMPRESSION: 1. Fluid-filled stomach and proximal small bowel. Persistent distension of the fluid-filled small bowel to 4.8 cm compared to 4.6 cm on 03/15/2019. Findings consistent with small bowel obstruction versus severe ileus. No pneumatosis or portal venous gas. No intraperitoneal free air. 2. Oral contrast from 03/24/2017 remains in the LEFT colon. 3. Post partial  gastrectomy with Roux-en-Y anatomy. Electronically Signed: By: Suzy Bouchard M.D. On: 03/28/2020 19:19   DG Abd  2 Views  Result Date: 03/29/2020 CLINICAL DATA:  Lower abdominal pain. History of small-bowel obstruction. EXAM: ABDOMEN - 2 VIEW COMPARISON:  CT abdomen and pelvis 03/28/2020. Abdominal radiographs 03/28/2020. FINDINGS: There is no evidence of intraperitoneal free air. A moderate amount of stool is again seen throughout the colon. There are multiple loops of mildly to moderately dilated small bowel in the mid and lower abdomen which were not apparent on yesterday's radiographs but are similar in caliber to the interval CT. Sutures are noted in the left upper quadrant related to prior partial gastrectomy. Excreted IV contrast is noted in the bladder. Mild left basilar lung opacity likely reflects atelectasis. No acute osseous abnormality is seen. IMPRESSION: Persistent small bowel dilatation which may reflect obstruction or ileus. Electronically Signed   By: Logan Bores M.D.   On: 03/29/2020 09:38   DG Abdomen Acute W/Chest  Result Date: 03/28/2020 CLINICAL DATA:  Abdominal pain.  Fatigue.  Gastric cancer. EXAM: DG ABDOMEN ACUTE WITH 1 VIEW CHEST COMPARISON:  03/14/2020 CT FINDINGS: Left Port-A-Cath tip at low SVC. Hyperinflation. Remote bilateral rib fractures. midline trachea. Normal heart size. No pleural effusion or pneumothorax. Clear lungs. The upright view demonstrates no free intraperitoneal air or significant air-fluid levels. The supine view demonstrates contrast within normal caliber colon. No gaseous distention of bowel loops. Surgical sutures likely of partial gastrectomy. Aortic atherosclerosis. IMPRESSION: No acute findings. Hyperinflation, suggesting COPD. Electronically Signed   By: Abigail Miyamoto M.D.   On: 03/28/2020 15:55   DG UGI W SINGLE CM (SOL OR THIN BA)  Result Date: 03/29/2020 CLINICAL DATA:  Stenosed Billroth 2 gastrojejunostomy demonstrated on endoscopy today for foreign body removal. History of gastric cancer post subtotal gastrectomy in 2018 with known recurrence. Recent  bowel obstruction. EXAM: WATER SOLUBLE UPPER GI SERIES TECHNIQUE: Single-column upper GI series was performed using water soluble contrast. CONTRAST:  110 cc Omnipaque 300 COMPARISON:  Radiographs earlier today. Abdominopelvic CT 03/28/2020. Endoscopy report earlier today. FLUOROSCOPY TIME:  Fluoroscopy Time: 1 minutes and 0 seconds low-dose pulsed fluoroscopy Radiation Exposure Index (if provided by the fluoroscopic device): 7.9 mGy Number of Acquired Spot Images: 1 scout image.  One spot image. FINDINGS: The scout abdominal radiograph demonstrates a nonobstructive bowel gas pattern. Contrast material is present in the distal colon from previous CT. There are surgical clips in the upper abdomen. Study was performed in the semi erect and supine positions. The patient swallowed the contrast without difficulty. The esophageal motility appears normal. No stricture of the thoracic esophagus identified. The cervical esophagus is suboptimally visualized. No evidence of mucosal ulceration or perforation. There is rapid emptying of the gastric pouch into the proximal small bowel which is mildly dilated. No extravasation, obstruction or retained foreign body identified. There is incomplete distension of the gastric pouch which demonstrates irregular fold thickening attributed to known local recurrence. IMPRESSION: 1. No evidence of perforation following endoscopy. No retained foreign bodies identified. 2. Mild anastomotic narrowing but no gastric outlet obstruction post partial gastrectomy and Billroth 2 gastrojejunostomy. 3. No abnormality of the thoracic esophagus identified. Electronically Signed   By: Richardean Sale M.D.   On: 03/29/2020 15:12    Anti-infectives: Anti-infectives (From admission, onward)   None      A/P; stage IV gastric cancer, stenosed GJ -he is having bowel function and tol liquids -do not think needs surgery or there is surgical fix to  this at this point -if tol diet can dc home from my  standpoint -will sign off   Rolm Bookbinder 03/30/2020

## 2020-03-30 NOTE — Discharge Summary (Signed)
Physician Discharge Summary   Patient ID: Gabriel Poole MRN: [id number] DOB/AGE: 73-Jul-1947 73 y.o.  Admit date: 03/28/2020 Discharge date: 03/30/2020  Primary Care Physician:  Wannetta Sender, FNP   Recommendations for Outpatient Follow-up:  1. Follow up with PCP in 1-2 weeks  Home Health: None, at baseline Equipment/Devices:   Discharge Condition: stable  CODE STATUS: FULL  Diet recommendation: Heart healthy diet   Discharge Diagnoses:    . Intractable nausea vomiting and abdominal pain likely severe ileus . HLD (hyperlipidemia) . HTN (hypertension) . Malignant neoplasm of body of stomach ([place]) . Severe protein calorie malnutrition BPH   Consults:   General surgery Gastroenterology Oncology     Allergies:   Allergies  Allergen Reactions  . Penicillins Itching and Other (See Comments)     patient had a PCN reaction causing immediate rash, facial/tongue/throat swelling, SOB or lightheadedness with hypotension: Unknown Has patient had a PCN reaction causing severe rash involving mucus membranes or skin necrosis: Unknown Has patient had a PCN reaction that required hospitalization: Unknown Has patient had a PCN reaction occurring within the last 10 years: No If all of the above answers are "NO", then may proceed with Cephalosporin use. Teenager*      DISCHARGE MEDICATIONS: Allergies as of 03/30/2020      Reactions   Penicillins Itching, Other (See Comments)    patient had a PCN reaction causing immediate rash, facial/tongue/throat swelling, SOB or lightheadedness with hypotension: Unknown Has patient had a PCN reaction causing severe rash involving mucus membranes or skin necrosis: Unknown Has patient had a PCN reaction that required hospitalization: Unknown Has patient had a PCN reaction occurring within the last 10 years: No If all of the above answers are "NO", then may proceed with Cephalosporin use. Teenager*      Medication List     STOP taking these medications   levofloxacin 750 MG tablet Commonly known as: Levaquin     TAKE these medications   amLODipine 10 MG tablet Commonly known as: NORVASC Take 1 tablet (10 mg total) by mouth daily.   atorvastatin 40 MG tablet Commonly known as: LIPITOR Take 1 tablet (40 mg total) by mouth daily at 6 PM.   Budesonide ER 9 MG Cp24 Take 9 mg by mouth daily.   famotidine 20 MG tablet Commonly known as: PEPCID Take 1 tablet (20 mg total) by mouth 2 (two) times daily.   finasteride 5 MG tablet Commonly known as: PROSCAR Take 5 mg by mouth daily.   HYDROcodone-acetaminophen 5-325 MG tablet Commonly known as: NORCO/VICODIN TAKE 1 TABLET EVERY 6 HOURS AS NEEDED FOR MODERATE PAIN What changed: See the new instructions.   hydrOXYzine 50 MG tablet Commonly known as: ATARAX/VISTARIL Take 50 mg by mouth every 6 (six) hours as needed for anxiety.   lidocaine-prilocaine cream Commonly known as: EMLA Apply to affected area once   megestrol 625 MG/5ML suspension Commonly known as: MEGACE ES Take 5 mLs (625 mg total) by mouth daily.   ondansetron 8 MG tablet Commonly known as: Zofran Take 1 tablet (8 mg total) by mouth 2 (two) times daily as needed for refractory nausea / vomiting. Start on day 3 after chemotherapy.   pantoprazole 40 MG tablet Commonly known as: Protonix Take 1 tablet (40 mg total) by mouth daily.   polyethylene glycol 17 g packet Commonly known as: MIRALAX / GLYCOLAX Take 17 g by mouth daily. Also available OTC   potassium chloride SA 20 MEQ tablet Commonly known as:  KLOR-CON Take 1 tablet (20 mEq total) by mouth 2 (two) times daily.   prochlorperazine 10 MG tablet Commonly known as: COMPAZINE Take 1 tablet (10 mg total) by mouth every 6 (six) hours as needed (Nausea or vomiting).   senna-docusate 8.6-50 MG tablet Commonly known as: Senokot-S Take 1 tablet by mouth 2 (two) times daily. Also available OTC   sucralfate 1 g tablet Commonly  known as: Carafate Take 1 tablet (1 g total) by mouth 4 (four) times daily -  with meals and at bedtime.   tadalafil 5 MG tablet Commonly known as: CIALIS Take 5 mg by mouth daily.   tamsulosin 0.4 MG Caps capsule Commonly known as: FLOMAX Take 0.4 mg by mouth daily.   triamterene-hydrochlorothiazide 37.5-25 MG tablet Commonly known as: MAXZIDE-25 Take 1 tablet by mouth daily.   Vitamin D3 125 MCG (5000 UT) Caps Take 5,000 Units by mouth daily.        Brief H and P: For complete details please refer to admission H and P, but in brief Patient is a 74 year old male with history of gastric CA, follows with Dr. Burr Medico, hypertension, hyperlipidemia presented with 2 weeks of nausea vomiting, poor appetite and epigastric pain.  Patient tried changing his diet but did not help.  Patient reported no BM for last 1 week.  He tried adding laxatives with no improvement.  Initially, left AMA from the ED however spoke with oncologist on the day of admission and returned back.   Hospital Course:   Intractable nausea vomiting and abdominal pain secondary to severe ileus versus SBO in the setting of gastric cancer -Initially placed on n.p.o. status with IV fluids.  CT abdomen pelvis showed persistent distention of the fluid-filled small bowel to 4.8 cm, consistent with SBO versus severe ileus, no pneumatosis or portal venous gas. -Abdominal x-ray 11/19 showed persistent small bowel dilatation may reflect obstruction or ileus -General surgery and GI was consulted.  -Patient underwent endoscopy, which showed grade 3 reflux and erosive esophagitis with no bleeding.  Friable gastric mucosa, stenosed Billroth II gastrojejunostomy found characterized by friable mucosa and severe stenosis.  Medium amount of phytobezoar in the stomach, removed -Patient significantly improved, currently tolerating solid diet, had 3 bowel movements.  Placed on Pepcid 20 mg p.o. twice daily, sucralfate per GI  recommendation Upper GI series showed no evidence of perforation, no retained foreign bodies identified, mild anastomotic narrowing but no gastric outlet obstruction past partial gastrectomy and Billroth II Cleared by general surgery to be discharged home, does not need any surgical intervention  History of gastric CA - Last chemo on 10/27, followed by Dr. Burr Medico while inpatient, will follow up outpatient.  Essential hypertension Resume outpatient antihypertensives  Hyperlipidemia Continue statin  BPH Currently n.p.o.  Severe protein calorie malnutrition Estimated body mass index is 16.5 kg/m as calculated from the following:   Height as of this encounter: 5\' 10"  (1.778 m).   Weight as of this encounter: 52.2 kg.   Day of Discharge S: Feels a lot better today, had 3 BMs yesterday evening, tolerating solid diet, looking forward to go home today  BP (!) 154/79 (BP Location: Right Arm)   Pulse (!) 57   Temp 98.1 F (36.7 C)   Resp 14   Ht 5\' 10"  (1.778 m)   Wt 52.2 kg   SpO2 100%   BMI 16.51 kg/m   Physical Exam: General: Alert and awake oriented x3 not in any acute distress. HEENT: anicteric sclera, pupils reactive to  light and accommodation CVS: S1-S2 clear no murmur rubs or gallops Chest: clear to auscultation bilaterally, no wheezing rales or rhonchi Abdomen: soft nontender, nondistended, normal bowel sounds Extremities: no cyanosis, clubbing or edema noted bilaterally Neuro: Cranial nerves II-XII intact, no focal neurological deficits    Get Medicines reviewed and adjusted: Please take all your medications with you for your next visit with your Primary MD  Please request your Primary MD to go over all hospital tests and procedure/radiological results at the follow up. Please ask your Primary MD to get all Hospital records sent to his/her office.  If you experience worsening of your admission symptoms, develop shortness of breath, life threatening emergency,  suicidal or homicidal thoughts you must seek medical attention immediately by calling 911 or calling your MD immediately  if symptoms less severe.  You must read complete instructions/literature along with all the possible adverse reactions/side effects for all the Medicines you take and that have been prescribed to you. Take any new Medicines after you have completely understood and accept all the possible adverse reactions/side effects.   Do not drive when taking pain medications.   Do not take more than prescribed Pain, Sleep and Anxiety Medications  Special Instructions: If you have smoked or chewed Tobacco  in the last 2 yrs please stop smoking, stop any regular Alcohol  and or any Recreational drug use.  Wear Seat belts while driving.  Please note  You were cared for by a hospitalist during your hospital stay. Once you are discharged, your primary care physician will handle any further medical issues. Please note that NO REFILLS for any discharge medications will be authorized once you are discharged, as it is imperative that you return to your primary care physician (or establish a relationship with a primary care physician if you do not have one) for your aftercare needs so that they can reassess your need for medications and monitor your lab values.   The results of significant diagnostics from this hospitalization (including imaging, microbiology, ancillary and laboratory) are listed below for reference.      Procedures/Studies:  CT ABDOMEN PELVIS W CONTRAST  Addendum Date: 03/28/2020   ADDENDUM REPORT: 03/28/2020 19:32 ADDENDUM: Findings conveyed topatient floor nurse, Adrianna on 03/28/2020 at19:31. Electronically Signed   By: Suzy Bouchard M.D.   On: 03/28/2020 19:32   Result Date: 03/28/2020 CLINICAL DATA:  Abdominal pain.  History of stomach cancer EXAM: CT ABDOMEN AND PELVIS WITH CONTRAST TECHNIQUE: Multidetector CT imaging of the abdomen and pelvis was performed using  the standard protocol following bolus administration of intravenous contrast. CONTRAST:  114mL OMNIPAQUE IOHEXOL 300 MG/ML  SOLN COMPARISON:  CT 03/14/2020 FINDINGS: Lower chest: Nodular pleuroparenchymal thickening in the LEFT lung base slightly improved. No pleural fluid. Hepatobiliary: Small low-density lesion in the central LEFT hepatic lobe is again demonstrated. Gallbladder is mildly distended which may relate to reported fasting state. Common bile duct is prominent. Findings similar to prior. Pancreas: Pancreas is normal. No ductal dilatation. No pancreatic inflammation. Spleen: Normal spleen Adrenals/urinary tract: Adrenal glands normal. Large nonenhancing cysts within the kidneys. Bladder normal. Stomach/Bowel: There is increased fluid in the stomach compared to prior. Post gastric surgery with apparent Roux-en-Y anatomy. The proximal small bowel is dilated significantly at the level of the gastric enteric anastomosis this dilated loop of bowel is fluid-filled measuring 5 cm. More distally the small bowel is diffusely dilated and fluid-filled. The bowel in the RIGHT lower quadrant measures 4.6 cm compared to 4.4 cm on  recent CT. There is no transition point identified the small bowel. Oral contrast from the CT 03/15/2019 remains in the descending colon sigmoid colon. No pneumatosis of the small bowel. No portal venous gas. No intraperitoneal free air. Vascular/Lymphatic: Abdominal aorta is normal caliber with atherosclerotic calcification. There is no retroperitoneal or periportal lymphadenopathy. No pelvic lymphadenopathy. Reproductive: Prostate enlarged to 5.8 cm Other: There is anasarca of the soft tissues there is little intra-abdominal fat which makes evaluation of the peritoneal contents difficult. Musculoskeletal: No aggressive osseous lesion. IMPRESSION: 1. Fluid-filled stomach and proximal small bowel. Persistent distension of the fluid-filled small bowel to 4.8 cm compared to 4.6 cm on 03/15/2019.  Findings consistent with small bowel obstruction versus severe ileus. No pneumatosis or portal venous gas. No intraperitoneal free air. 2. Oral contrast from 03/24/2017 remains in the LEFT colon. 3. Post partial gastrectomy with Roux-en-Y anatomy. Electronically Signed: By: Suzy Bouchard M.D. On: 03/28/2020 19:19   CT CHEST ABDOMEN PELVIS W CONTRAST  Result Date: 03/14/2020 CLINICAL DATA:  Restaging gastric cancer. Weight loss and loss of appetite. EXAM: CT CHEST, ABDOMEN, AND PELVIS WITH CONTRAST TECHNIQUE: Multidetector CT imaging of the chest, abdomen and pelvis was performed following the standard protocol during bolus administration of intravenous contrast. CONTRAST:  130mL OMNIPAQUE IOHEXOL 300 MG/ML  SOLN COMPARISON:  PET-CT 12/05/2019 FINDINGS: CT CHEST FINDINGS Cardiovascular: Left Port-A-Cath tip: SVC. Aortic and branch vessel atherosclerotic vascular calcifications. Ascending thoracic aortic aneurysm 4.2 cm in diameter, stable. Mediastinum/Nodes: Unremarkable Lungs/Pleura: Bilateral airway thickening with airway plugging in both lower lobes and resulting airspace opacities due to atelectasis and possibly pneumonia in the lower lobes. Musculoskeletal: A few small sclerotic right rib lesions are unchanged and not previously hypermetabolic, and considered benign. CT ABDOMEN PELVIS FINDINGS Hepatobiliary: 1.0 cm hypodense lesion in segment 4 of the liver corresponding to hypermetabolic lesion shown on prior PET-CT, but subjectively reduced in size compared to prior PET-CT. Stable 0.8 by 0.5 cm hypodense lesion in the lateral segment left hepatic lobe on image 57/2, no change from 04/04/2019. Borderline wall thickening in the gallbladder. Pancreas: Unremarkable Spleen: Unremarkable Adrenals/Urinary Tract: Both adrenal glands appear normal. Bilateral renal cysts are present. Nondistended urinary bladder. Stomach/Bowel: Partial gastrectomy. There is previously very high activity in the gastric wall. There  are some regions of indistinctness and thickening of the gastric wall and residual tumor is not excluded for example on images 60-62 of series 2. There are dilated loops of small bowel anteriorly measuring up to 4.2 cm in diameter, without a well-defined lead point for obstruction. Cannot exclude wall thickening in the nondistended ascending colon and hepatic flexure. Vascular/Lymphatic: Aortoiliac atherosclerotic vascular disease. 1 no well-defined adenopathy. Reproductive: Marked prostatomegaly. Other: Diffuse low-grade subcutaneous and mesenteric edema. Trace ascites in the pelvis. Musculoskeletal: Unremarkable IMPRESSION: 1. The small hypodense lesion in segment 4 of the liver was previously hypermetabolic, and is subjectively reduced in size compared to prior PET-CT. 2. Dilated loops of small bowel anteriorly without a well-defined lead point for obstruction. This could be from ileus or low-grade partial small bowel obstruction. 3. Continued gastric wall thickening is not entirely specific but residual gastric tumor is not excluded. Postoperative findings along the residual stomach. 4. Cannot exclude wall thickening in the nondistended ascending colon and hepatic flexure. 5. Diffuse low-grade subcutaneous and mesenteric edema. Trace ascites in the pelvis. Appearance favors third spacing of fluid. 6. Bilateral airway thickening with airway plugging in both lower lobes and resulting airspace opacities due to atelectasis and possibly pneumonia in the  lower lobes. 7. Marked prostatomegaly. 8. Stable 4.2 cm ascending thoracic aortic aneurysm. 9. Aortic atherosclerosis. Aortic Atherosclerosis (ICD10-I70.0). Electronically Signed   By: Van Clines M.D.   On: 03/14/2020 17:55   DG Abd 2 Views  Result Date: 03/29/2020 CLINICAL DATA:  Lower abdominal pain. History of small-bowel obstruction. EXAM: ABDOMEN - 2 VIEW COMPARISON:  CT abdomen and pelvis 03/28/2020. Abdominal radiographs 03/28/2020. FINDINGS: There  is no evidence of intraperitoneal free air. A moderate amount of stool is again seen throughout the colon. There are multiple loops of mildly to moderately dilated small bowel in the mid and lower abdomen which were not apparent on yesterday's radiographs but are similar in caliber to the interval CT. Sutures are noted in the left upper quadrant related to prior partial gastrectomy. Excreted IV contrast is noted in the bladder. Mild left basilar lung opacity likely reflects atelectasis. No acute osseous abnormality is seen. IMPRESSION: Persistent small bowel dilatation which may reflect obstruction or ileus. Electronically Signed   By: Logan Bores M.D.   On: 03/29/2020 09:38   DG Abdomen Acute W/Chest  Result Date: 03/28/2020 CLINICAL DATA:  Abdominal pain.  Fatigue.  Gastric cancer. EXAM: DG ABDOMEN ACUTE WITH 1 VIEW CHEST COMPARISON:  03/14/2020 CT FINDINGS: Left Port-A-Cath tip at low SVC. Hyperinflation. Remote bilateral rib fractures. midline trachea. Normal heart size. No pleural effusion or pneumothorax. Clear lungs. The upright view demonstrates no free intraperitoneal air or significant air-fluid levels. The supine view demonstrates contrast within normal caliber colon. No gaseous distention of bowel loops. Surgical sutures likely of partial gastrectomy. Aortic atherosclerosis. IMPRESSION: No acute findings. Hyperinflation, suggesting COPD. Electronically Signed   By: Abigail Miyamoto M.D.   On: 03/28/2020 15:55   DG UGI W SINGLE CM (SOL OR THIN BA)  Result Date: 03/29/2020 CLINICAL DATA:  Stenosed Billroth 2 gastrojejunostomy demonstrated on endoscopy today for foreign body removal. History of gastric cancer post subtotal gastrectomy in 2018 with known recurrence. Recent bowel obstruction. EXAM: WATER SOLUBLE UPPER GI SERIES TECHNIQUE: Single-column upper GI series was performed using water soluble contrast. CONTRAST:  110 cc Omnipaque 300 COMPARISON:  Radiographs earlier today. Abdominopelvic CT  03/28/2020. Endoscopy report earlier today. FLUOROSCOPY TIME:  Fluoroscopy Time: 1 minutes and 0 seconds low-dose pulsed fluoroscopy Radiation Exposure Index (if provided by the fluoroscopic device): 7.9 mGy Number of Acquired Spot Images: 1 scout image.  One spot image. FINDINGS: The scout abdominal radiograph demonstrates a nonobstructive bowel gas pattern. Contrast material is present in the distal colon from previous CT. There are surgical clips in the upper abdomen. Study was performed in the semi erect and supine positions. The patient swallowed the contrast without difficulty. The esophageal motility appears normal. No stricture of the thoracic esophagus identified. The cervical esophagus is suboptimally visualized. No evidence of mucosal ulceration or perforation. There is rapid emptying of the gastric pouch into the proximal small bowel which is mildly dilated. No extravasation, obstruction or retained foreign body identified. There is incomplete distension of the gastric pouch which demonstrates irregular fold thickening attributed to known local recurrence. IMPRESSION: 1. No evidence of perforation following endoscopy. No retained foreign bodies identified. 2. Mild anastomotic narrowing but no gastric outlet obstruction post partial gastrectomy and Billroth 2 gastrojejunostomy. 3. No abnormality of the thoracic esophagus identified. Electronically Signed   By: Richardean Sale M.D.   On: 03/29/2020 15:12       LAB RESULTS: Basic Metabolic Panel: Recent Labs  Lab 03/28/20 1419 03/29/20 0553  NA 141 137  K 4.2 3.8  CL 108 111  CO2 22 20*  GLUCOSE 107* 82  BUN 40* 38*  CREATININE 1.21 1.11  CALCIUM 9.2 8.4*   Liver Function Tests: Recent Labs  Lab 03/28/20 1419 03/29/20 0553  AST 27 18  ALT 17 13  ALKPHOS 89 72  BILITOT 0.8 0.8  PROT 5.8* 5.0*  ALBUMIN 3.3* 2.7*   Recent Labs  Lab 03/28/20 1419  LIPASE 51   No results for input(s): AMMONIA in the last 168  hours. CBC: Recent Labs  Lab 03/27/20 1115 03/27/20 1115 03/28/20 1419 03/28/20 1419 03/29/20 0553  WBC 6.6  --  10.8*  --  8.0  NEUTROABS 4.5  --   --   --   --   HGB 9.4*  --  10.6*  --  8.6*  HCT 30.2*   < > 34.7*  --  27.9*  MCV 88.6   < > 92.0   < > 92.1  PLT 289  --  319  --  286   < > = values in this interval not displayed.   Cardiac Enzymes: No results for input(s): CKTOTAL, CKMB, CKMBINDEX, TROPONINI in the last 168 hours. BNP: Invalid input(s): POCBNP CBG: No results for input(s): GLUCAP in the last 168 hours.     Disposition and Follow-up: Discharge Instructions    Diet - low sodium heart healthy   Complete by: As directed    Increase activity slowly   Complete by: As directed        DISPOSITION: [place], [place], FNP. Schedule an appointment as soon as possible for a visit in 2 week(s).   Specialty: Family Medicine Contact information: [facility] [address] [place] [address] [phone]        Truitt Merle, MD Follow up on 04/02/2020.   Specialties: Hematology, Oncology Why: at 9:40am Contact information: [place] [address] [phone]        Mauri Pole, MD Follow up in 2 week(s).   Specialty: Gastroenterology Contact information: [place] [address] [phone]                Time coordinating discharge:  35 minutes  Signed:   Estill Cotta M.D. [facility] 03/30/2020, 12:04 PM

## 2020-03-30 NOTE — Progress Notes (Signed)
Patient was given discharge instructions, and all questions were answered.  Patient was taken to main exit by wheelchair. 

## 2020-04-01 ENCOUNTER — Encounter (HOSPITAL_COMMUNITY): Payer: Self-pay | Admitting: Gastroenterology

## 2020-04-02 ENCOUNTER — Other Ambulatory Visit: Payer: Self-pay

## 2020-04-02 ENCOUNTER — Encounter: Payer: Self-pay | Admitting: Hematology

## 2020-04-02 ENCOUNTER — Inpatient Hospital Stay (HOSPITAL_BASED_OUTPATIENT_CLINIC_OR_DEPARTMENT_OTHER): Payer: Medicare HMO | Admitting: Hematology

## 2020-04-02 ENCOUNTER — Inpatient Hospital Stay: Payer: Medicare HMO

## 2020-04-02 ENCOUNTER — Telehealth: Payer: Self-pay

## 2020-04-02 VITALS — BP 112/84 | HR 116 | Temp 97.6°F | Resp 16 | Ht 70.0 in | Wt 108.2 lb

## 2020-04-02 DIAGNOSIS — G629 Polyneuropathy, unspecified: Secondary | ICD-10-CM | POA: Diagnosis not present

## 2020-04-02 DIAGNOSIS — Z95828 Presence of other vascular implants and grafts: Secondary | ICD-10-CM

## 2020-04-02 DIAGNOSIS — C163 Malignant neoplasm of pyloric antrum: Secondary | ICD-10-CM | POA: Diagnosis not present

## 2020-04-02 DIAGNOSIS — E860 Dehydration: Secondary | ICD-10-CM

## 2020-04-02 DIAGNOSIS — C162 Malignant neoplasm of body of stomach: Secondary | ICD-10-CM

## 2020-04-02 DIAGNOSIS — Z79899 Other long term (current) drug therapy: Secondary | ICD-10-CM | POA: Diagnosis not present

## 2020-04-02 DIAGNOSIS — D509 Iron deficiency anemia, unspecified: Secondary | ICD-10-CM | POA: Diagnosis not present

## 2020-04-02 DIAGNOSIS — R0989 Other specified symptoms and signs involving the circulatory and respiratory systems: Secondary | ICD-10-CM | POA: Diagnosis not present

## 2020-04-02 DIAGNOSIS — C787 Secondary malignant neoplasm of liver and intrahepatic bile duct: Secondary | ICD-10-CM | POA: Diagnosis not present

## 2020-04-02 DIAGNOSIS — I712 Thoracic aortic aneurysm, without rupture: Secondary | ICD-10-CM | POA: Diagnosis not present

## 2020-04-02 LAB — CBC WITH DIFFERENTIAL (CANCER CENTER ONLY)
Abs Immature Granulocytes: 0.17 10*3/uL — ABNORMAL HIGH (ref 0.00–0.07)
Basophils Absolute: 0 10*3/uL (ref 0.0–0.1)
Basophils Relative: 0 %
Eosinophils Absolute: 0 10*3/uL (ref 0.0–0.5)
Eosinophils Relative: 0 %
HCT: 36.7 % — ABNORMAL LOW (ref 39.0–52.0)
Hemoglobin: 11.6 g/dL — ABNORMAL LOW (ref 13.0–17.0)
Immature Granulocytes: 1 %
Lymphocytes Relative: 7 %
Lymphs Abs: 1.1 10*3/uL (ref 0.7–4.0)
MCH: 27.8 pg (ref 26.0–34.0)
MCHC: 31.6 g/dL (ref 30.0–36.0)
MCV: 88 fL (ref 80.0–100.0)
Monocytes Absolute: 0.9 10*3/uL (ref 0.1–1.0)
Monocytes Relative: 6 %
Neutro Abs: 13.4 10*3/uL — ABNORMAL HIGH (ref 1.7–7.7)
Neutrophils Relative %: 86 %
Platelet Count: 365 10*3/uL (ref 150–400)
RBC: 4.17 MIL/uL — ABNORMAL LOW (ref 4.22–5.81)
RDW: 18.1 % — ABNORMAL HIGH (ref 11.5–15.5)
WBC Count: 15.6 10*3/uL — ABNORMAL HIGH (ref 4.0–10.5)
nRBC: 0 % (ref 0.0–0.2)

## 2020-04-02 LAB — CMP (CANCER CENTER ONLY)
ALT: 7 U/L (ref 0–44)
AST: 13 U/L — ABNORMAL LOW (ref 15–41)
Albumin: 3.2 g/dL — ABNORMAL LOW (ref 3.5–5.0)
Alkaline Phosphatase: 72 U/L (ref 38–126)
Anion gap: 14 (ref 5–15)
BUN: 57 mg/dL — ABNORMAL HIGH (ref 8–23)
CO2: 21 mmol/L — ABNORMAL LOW (ref 22–32)
Calcium: 9.2 mg/dL (ref 8.9–10.3)
Chloride: 109 mmol/L (ref 98–111)
Creatinine: 2.18 mg/dL — ABNORMAL HIGH (ref 0.61–1.24)
GFR, Estimated: 31 mL/min — ABNORMAL LOW (ref >60)
Glucose, Bld: 107 mg/dL — ABNORMAL HIGH (ref 70–99)
Potassium: 3.8 mmol/L (ref 3.5–5.1)
Sodium: 144 mmol/L (ref 135–145)
Total Bilirubin: 0.9 mg/dL (ref 0.3–1.2)
Total Protein: 5.8 g/dL — ABNORMAL LOW (ref 6.5–8.1)

## 2020-04-02 MED ORDER — SODIUM CHLORIDE 0.9 % IV SOLN
INTRAVENOUS | Status: DC
  Filled 2020-04-02: qty 250

## 2020-04-02 MED ORDER — HYDROCODONE-ACETAMINOPHEN 5-325 MG PO TABS: 1.0000 | ORAL_TABLET | Freq: Four times a day (QID) | ORAL | 0 refills | Status: DC | PRN

## 2020-04-02 MED ORDER — HEPARIN SOD (PORK) LOCK FLUSH 100 UNIT/ML IV SOLN
500.0000 [IU] | Freq: Once | INTRAVENOUS | Status: AC | PRN
  Administered 2020-04-02: 500 [IU] via INTRAVENOUS
  Filled 2020-04-02: qty 5

## 2020-04-02 MED ORDER — SODIUM CHLORIDE 0.9% FLUSH
10.0000 mL | INTRAVENOUS | Status: DC | PRN
  Administered 2020-04-02: 10 mL via INTRAVENOUS
  Filled 2020-04-02: qty 10

## 2020-04-02 MED ORDER — SODIUM CHLORIDE 0.9% FLUSH
10.0000 mL | INTRAVENOUS | Status: DC | PRN
  Filled 2020-04-02: qty 10

## 2020-04-02 MED ORDER — SODIUM CHLORIDE 0.9 % IV SOLN
INTRAVENOUS | Status: AC
  Filled 2020-04-02: qty 250

## 2020-04-02 MED ORDER — HEPARIN SOD (PORK) LOCK FLUSH 100 UNIT/ML IV SOLN
500.0000 [IU] | Freq: Once | INTRAVENOUS | Status: DC | PRN
  Filled 2020-04-02: qty 5

## 2020-04-02 NOTE — Telephone Encounter (Signed)
Late Entry. Ms Paynter left vm yesterday stating tha Gabriel Poole was "not doing well, needs to go back to hospital, he is short of breath, weak, and not eating."  I called her back but got vm.  I reminded her he has appts this am with [place] or she can take him to the ED.

## 2020-04-02 NOTE — Patient Instructions (Signed)

## 2020-04-02 NOTE — Progress Notes (Signed)
Pt sent back to lab for lab draw. Pt uncooperative.

## 2020-04-02 NOTE — Progress Notes (Signed)
[place]   Telephone:([phone]) [phone] Fax:(336) [phone]   Clinic Follow up Note   Patient Care Team: Wannetta Sender, FNP as PCP - General (Family Medicine) Truitt Merle, MD as Consulting Physician (Hematology) Alla Feeling, NP as Nurse Practitioner (Nurse Practitioner) Stark Klein, MD as Consulting Physician (General Surgery) Rogene Houston, MD as Consulting Physician (Gastroenterology) Milus Banister, MD as Attending Physician (Gastroenterology) Karie Mainland, RD as Dietitian (Nutrition)  Date of Service:  04/02/2020  CHIEF COMPLAINT: F/u on gastric cancer and IDA  SUMMARY OF ONCOLOGIC HISTORY: Oncology History Overview Note  Cancer Staging Malignant neoplasm of body of stomach ([place]) Staging form: Stomach, AJCC 8th Edition - Clinical stage from 03/11/2017: Stage IIB (cT3, cN0, cM0) - Unsigned - Pathologic stage from 04/22/2017: Stage IIIB (pT4a, pN3a, cM0) - Signed by Alla Feeling, NP on 05/17/2017     Malignant neoplasm of body of stomach ([place])  02/17/2017 Initial Diagnosis   Malignant neoplasm of body of stomach ([place])   02/17/2017 Pathology Results   Diagnosis Stomach, biopsy, gastric ulcer - ADENOCARCINOMA. Microscopic Comment Several of the fragments are involved by moderately differentiated adenocarcinoma.   02/17/2017 Procedure   UPPER ENDOSCOPY  FINDINGS - Normal esophagus. - Z-line irregular, 44 cm from the incisors. - Red blood in the gastric body and in the gastric antrum. - Large gastric ulcer. Biopsied. - Erythematous mucosa in the antrum. - Normal cardia, gastric fundus, gastric body and pylorus. - Normal duodenal bulb and second portion of the duodenum. Comment: Endoscopic appearence concerning for malignant ulcer.   03/11/2017 Procedure   UPPER EUS PER DR. Ardis Hughs Findings: 1. The esophagus was normal. 2. There was a 2-3cm ulcerated, malignant mass along the greater curvature of the stomach, approximately  mid-body. The mass was non-circumferential. 3. The duodenum was normal. Endosonographic Finding 1. The gastric mass above correlated with a hypoechoic and heterogenous non-circumferential mass that measured 2.9cm across, 56m deep. The endosonographic borders were poorly defined and there was clear sonographic evidence suggesting invasion into and through the muscularis propria layer without evident invasion into nearby organs (uT3). 2. The duodenal lymphnode described on recent CT scan appeared reactive by [place]. 3. No perigastric adenopathy (uN0) 4. Limited views of the liver, spleen, pancreas, bile duct, gallbladder were all normal. - Along the greater curvature of the stomach, approximately mid-body, there is a 2.9cm uT3N0 (clinical stage IIB) gastric adenocarcinoma.   04/06/2017 Imaging   CT CHEST IMPRESSION: 1. No acute cardiopulmonary abnormalities. 2. Two small pulmonary nodules are noted measuring up to 5 mm. Nonspecific but warrant attention on follow-up imaging follow up 3. Nonspecific hyperdense, possibly enhancing lesion along the dome of liver is identified. In a patient that is at increased risk for more definitive assessment of this structure with contrast enhanced MRI of the liver is advised.   04/21/2017 Imaging   MR ABDOMEN IMPRESSION: 1. Enhancing 2.9 cm mass along the lesser curvature in the gastric antrum, compatible with known gastric malignancy . 2. Mildly enlarged gastrohepatic ligament lymph node, cannot exclude nodal metastasis. 3. No definite liver metastatic disease. Hyperenhancing 0.9 cm liver dome mass remains inconclusive, although the MRI features are most suggestive of a flash filling hemangioma, and the mass has been stable for nearly 2 months. Additional subcentimeter focus of hyperenhancement in the lateral segment left liver lobe is most likely a benign transient vascular phenomenon. Recommend attention to these lesions on a follow-up MRI  abdomen without and with IV contrast in  3-6 months. This recommendation follows ACR consensus guidelines: Management of Incidental Liver Lesions on CT: A White Paper of the ACR Incidental Findings Committee. J Am Coll Radiol 2017; 27:0350-0938. 4. Benign right adrenal adenoma.     04/22/2017 Pathology Results   Diagnosis 1. Liver, biopsy - BILE DUCT HAMARTOMA. - THERE IS NO EVIDENCE OF MALIGNANCY. 2. Lymph nodes, regional resection, portal - METASTATIC CARCINOMA IN 2 OF 6 LYMPH NODES (2/6). 3. Stomach, resection for tumor, distal - INVASIVE ADENOCARCINOMA, POORLY DIFFERENTIATED, SPANNING 3.8 CM. - PERINEURAL INVASION IS IDENTIFIED. - ADENOCARCINOMA INVOLVES THE SEROSA. - METASTATIC CARCINOMA IN 12 OF 30 LYMPH NODES (12/30), WITH EXTRACAPSULAR EXTENSION. - SEE ONCOLOGY TABLE BELOW. 4. Lymph node, biopsy, common hepatic artery - THERE IS NO EVIDENCE OF CARCINOMA IN 1 OF 1 LYMPH NODE (0/1). Microscopic Comment 3. STOMACH: Specimen: Stomach. Procedure: Partial gastrectomy. Tumor Site: Greater curvature. Tumor Size: 3.8 cm Histologic Type: Adenocarcinoma. Histologic Grade: G3: poorly differentiated. Microscopic Extent of Tumor: Adenocarcinoma involves the serosa. Margins (select all that apply): Adenoca Proximal Margin: Negative for adenocarcinoma. Distal Margin: Negative for adenocarcinoma. Treatment Effect: N/A Lymph-Vascular Invasion: Not identified. Perineural Invasion: Present. Additional findings: Chronic gastritis. Ancillary testing: Can be performed upon clinician request. 1 of 3 Duplicate copy FINAL for Gabriel, Poole ([id number]) Microscopic Comment(continued) Lymph nodes: number examined - 37; number positive: 14 Pathologic Staging: pT4a, pN3a (JBK:gt, 04/27/17)   06/02/2017 - 09/30/2017 Adjuvant Chemotherapy   FOLFOX every 2 weeks, oxaliplatin stopped in March 2019 due to neuropathy, chemo stopped on 09/30/2017 per pt's request    10/29/2017 Imaging    10/29/2017 CT CAP  IMPRESSION: 1. Status post distal gastrectomy. No evidence for metastatic disease in the chest, abdomen, or pelvis. 2. Stable tiny right pulmonary nodules. Continued attention on follow-up recommended. 3. 9 mm hypervascular lesion in the dome of the liver is stable over multiple studies back to 04/05/2017. Continued attention on follow-up suggested. 4. Ascending thoracic aorta measures 4.2 cm diameter. Recommend annual imaging followup by CTA or MRA. This recommendation follows 2010 ACCF/AHA/AATS/ACR/ASA/SCA/SCAI/SIR/STS/SVM Guidelines for the Diagnosis and Management of Patients with Thoracic Aortic Disease. Circulation. 2010; 121: R678-L381 . 5. Marked prostatomegaly. 6.  Aortic Atherosclerois (ICD10-170.0)   05/09/2018 Imaging   05/09/2018 CT CAP IMPRESSION: 1. No definite findings to suggest metastatic disease to the chest, abdomen or pelvis. 2. Multiple small pulmonary nodules scattered throughout the lungs bilaterally measuring 5 mm or less in size, stable compared to prior studies from 2018, favored to be benign. Continued attention on future follow-up examinations is recommended. 3. Aortic atherosclerosis with ectasia of the ascending thoracic aorta (4 cm in diameter). Recommend annual imaging followup by CTA or MRA. This recommendation follows 2010 ACCF/AHA/AATS/ACR/ASA/SCA/SCAI/SIR/STS/SVM Guidelines for the Diagnosis and Management of Patients with Thoracic Aortic Disease. Circulation. 2010; 121: O175-Z025. 4. Multiple small calculi lying dependently in the right-side of the urinary bladder. 5. Severe prostatomegaly. 6. Additional incidental findings, as above.   04/04/2019 Imaging   CT AP IMPRESSION: 1. Redemonstrated postoperative findings of distal gastrectomy and gastrojejunostomy (series 2, image 18, series 4, image 27). No evidence of malignant recurrence or metastatic disease in the abdomen or pelvis. 2. Subcutaneous body fat is  substantially diminished in comparison to prior examination, in keeping with reported history of weight loss. 3. Gross prostatomegaly and urinary bladder wall thickening, likely due to chronic outlet obstruction. 4.  Aortic Atherosclerosis (ICD10-I70.0).   08/16/2019 Imaging   CT CAP w contrast IMPRESSION: 1. No acute findings within the chest, abdomen or pelvis.  No specific findings identified to suggest residual or recurrent tumor or metastatic disease. 2. Small nonspecific pulmonary nodules are unchanged. 3. Marked enlargement of the prostate gland. 4. Multiple tiny bladder stones. 5. Aortic atherosclerosis. Aortic Atherosclerosis (ICD10-I70.0).   11/02/2019 Procedure   EGD impression - Normal hypopharynx. - Normal esophagus. - Z-line regular, 45 cm from the incisors. - Patent Billroth II gastrojejunostomy was found. 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 Biopsied. - Gastric ulcer with raised margins appears to be separate lesion. Biopsied.  comment: recurrent tumor at anastamosis appears to be separate form fundal lesion. these lesions are the source of chronic blood loss.  Colonoscopy impression: - Perianal skin tags found on perianal exam. - The entire examined colon is normal. - External and internal hemorrhoids. - No specimens collected.   11/02/2019 Pathology Results   FINAL MICROSCOPIC DIAGNOSIS:  A. STOMACH, BIOPSY:  -  Poorly differentiated carcinoma  -  See comment  B. STOMACH, FUNDUS, BIOPSY:  -  Adenocarcinoma  -  See comment  COMMENT:  A and B.  The carcinoma in part A is more poorly differentiated than  what is seen in part B but both are favored to be adenocarcinoma.  Dr.  Jeannie Done reviewed the case and agrees with the above diagnosis. Dr.  Laural Golden was notified of these results on November 03, 2019.    12/05/2019 PET scan   IMPRESSION: Marked hypermetabolic activity within the thickened stomach in this patient with known gastric cancer with associated  metastatic disease to the liver and small lymph nodes in the area of the hepatic gastric recess that are suspicious based on location and size though not particularly FDG avid.   Small lymph nodes along the posterolateral margin of the aorta with mild increased FDG uptake (image 107) (SUVmax = 2.8 these are approximately 6-7 mm and there are several small nodes tracking beneath the LEFT retrocrural region. Attention on follow-up)   Increased FDG uptake in the central portion of the prostate likely within the prosthetic urethra given the marked hypermetabolic features in the central location. Would however consider correlation with PSA as clinically warranted. Prostate is markedly enlarged otherwise with heterogeneous FDG uptake.   Aortic Atherosclerosis (ICD10-I70.0).    12/07/2019 - 03/22/2020 Chemotherapy   FOLFOX every 2 weeks starting 12/07/19. Add Transtuzumab (Kanjinti) q2 weeks and Keytruda q6weeks with C3 on 01/24/20. Held since 03/22/20 due to chest congestion and bowel inflammation. Ultimately stopped 03/29/20 due to suspected relation to ileus.    12/14/2019 Pathology Results   FINAL MICROSCOPIC DIAGNOSIS:   A. LIVER, NEEDLE CORE BIOPSY:  - Poorly differentiated adenocarcinoma, see comment.   COMMENT:   The tumor has a similar appearance to the patient's recent gastric  biopsies ([id number]). There is sufficient material for additional  testing if requested.    03/14/2020 Imaging   CT CAP IMPRESSION: 1. The small hypodense lesion in segment 4 of the liver was previously hypermetabolic, and is subjectively reduced in size compared to prior PET-CT. 2. Dilated loops of small bowel anteriorly without a well-defined lead point for obstruction. This could be from ileus or low-grade partial small bowel obstruction. 3. Continued gastric wall thickening is not entirely specific but residual gastric tumor is not excluded. Postoperative findings along the residual stomach. 4.  Cannot exclude wall thickening in the nondistended ascending colon and hepatic flexure. 5. Diffuse low-grade subcutaneous and mesenteric edema. Trace ascites in the pelvis. Appearance favors third spacing of fluid. 6. Bilateral airway thickening with airway plugging in both  lower lobes and resulting airspace opacities due to atelectasis and possibly pneumonia in the lower lobes. 7. Marked prostatomegaly. 8. Stable 4.2 cm ascending thoracic aortic aneurysm. 9. Aortic atherosclerosis.   03/28/2020 Imaging   CT AP  IMPRESSION: 1. Fluid-filled stomach and proximal small bowel. Persistent distension of the fluid-filled small bowel to 4.8 cm compared to 4.6 cm on 03/15/2019. Findings consistent with small bowel obstruction versus severe ileus. No pneumatosis or portal venous gas. No intraperitoneal free air. 2. Oral contrast from 03/24/2017 remains in the LEFT colon. 3. Post partial gastrectomy with Roux-en-Y anatomy.     03/29/2020 Procedure   Upper Endoscopy by Dr Silverio Decamp 03/29/20  IMPRESSION - LA Grade D reflux and erosive esophagitis with no bleeding. - Friable gastric mucosa. - Stenosed Billroth II gastrojejunostomy was found, characterized by friable mucosa and severe stenosis. - A medium amount of a phytobezoar in the stomach. Removal was successful.      CURRENT THERAPY:  Symptom Management, Palliative care   INTERVAL HISTORY:  LAZARIUS RIVKIN is here for a follow up. He presents to the clinic with Hinda Kehr who lives with him. He notes since hospital discharge her has hiccups, but she notes this is shortness of breath with him gasping for air. He is not eating as he has no appetite. He notes lower abdominal pain is stable. He has not taken pain medication for a few days as he cannot get a refill until tomorrow. He has been taking Norco 2-3 times a day. He has not been working in the past week. He has a BM this morning. He denies fever, diarrhea or dizziness. He is  willing to go to ED for hospitalization in the morning.    REVIEW OF SYSTEMS:   Constitutional: Denies fevers, chills or abnormal weight loss (+) Low appetite, Anorexia, weight loss   Eyes: Denies blurriness of vision Ears, nose, mouth, throat, and face: Denies mucositis or sore throat Respiratory: Denies cough, dyspnea or wheezes Cardiovascular: Denies palpitation, chest discomfort or lower extremity swelling Gastrointestinal:  Denies nausea, heartburn or change in bowel habits (+) Lower abdominal pain  Skin: Denies abnormal skin rashes Lymphatics: Denies new lymphadenopathy or easy bruising Neurological:Denies numbness, tingling or new weaknesses Behavioral/Psych: Mood is stable, no new changes  All other systems were reviewed with the patient and are negative.  MEDICAL HISTORY:  Past Medical History:  Diagnosis Date  . Arthritis   . Cancer [facility]) dx oct 02-2017   stomach  . Gout   . Heart murmur   . Hyperlipidemia   . Hypertension   . Stomach cancer [facility])     SURGICAL HISTORY: Past Surgical History:  Procedure Laterality Date  . BIOPSY  11/02/2019   Procedure: BIOPSY;  Surgeon: Rogene Houston, MD;  [facility];  Service: Endoscopy;;  gastric  . COLONOSCOPY    . COLONOSCOPY N/A 11/02/2019   Procedure: COLONOSCOPY;  Surgeon: Rogene Houston, MD;  [facility];  Service: Endoscopy;  Laterality: N/A;  100  . ESOPHAGOGASTRODUODENOSCOPY N/A 02/17/2017   Procedure: ESOPHAGOGASTRODUODENOSCOPY (EGD);  Surgeon: Rogene Houston, MD;  [facility];  Service: Endoscopy;  Laterality: N/A;  3:00  . ESOPHAGOGASTRODUODENOSCOPY N/A 11/02/2019   Procedure: ESOPHAGOGASTRODUODENOSCOPY (EGD);  Surgeon: Rogene Houston, MD;  [facility];  Service: Endoscopy;  Laterality: N/A;  . ESOPHAGOGASTRODUODENOSCOPY (EGD) WITH PROPOFOL N/A 03/29/2020   Procedure: ESOPHAGOGASTRODUODENOSCOPY (EGD) WITH PROPOFOL;  Surgeon: Mauri Pole, MD;  [facility];  Service: Endoscopy;  Laterality: N/A;  . EUS N/A 03/11/2017   Procedure: UPPER ENDOSCOPIC ULTRASOUND (EUS) LINEAR;  Surgeon: Milus Banister, MD;  [facility];  Service: Endoscopy;  Laterality: N/A;  . EUS N/A 03/11/2017   Procedure: UPPER ENDOSCOPIC ULTRASOUND (EUS) RADIAL;  Surgeon: Milus Banister, MD;  [facility];  Service: Endoscopy;  Laterality: N/A;  . FINGER SURGERY     Lt middle   . FOREIGN BODY REMOVAL  03/29/2020   Procedure: FOREIGN BODY REMOVAL;  Surgeon: Mauri Pole, MD;  [facility];  Service: Endoscopy;;  . GASTRECTOMY N/A 04/22/2017   Procedure: PARTIAL GASTRECTOMY;  Surgeon: Stark Klein, MD;  Location: [place];  Service: General;  Laterality: N/A;  . GASTROJEJUNOSTOMY N/A 04/22/2017   Procedure: JEJUNAL FEEDING TUBE PLACEMENT;  Surgeon: Stark Klein, MD;  Location: [place];  Service: General;  Laterality: N/A;  . HYDROCELE EXCISION  02/12/2011   Procedure: HYDROCELECTOMY ADULT;  Surgeon: Marissa Nestle;  Location: [facility];  Service: Urology;  Laterality: Left;  . LAPAROSCOPY N/A 04/22/2017   Procedure: LAPAROSCOPY DIAGNOSTIC ERAS PATHWAY;  Surgeon: Stark Klein, MD;  Location: [place];  Service: General;  Laterality: N/A;  EPIDURAL  . PORTACATH PLACEMENT N/A 05/28/2017   Procedure: INSERTION PORT-A-CATH ERAS PATHWAY;  Surgeon: Stark Klein, MD;  Location: [place];  Service: General;  Laterality: N/A;    I have reviewed the social history and family history with the patient and they are unchanged from previous note.  ALLERGIES:  is allergic to penicillins.  MEDICATIONS:  Current Outpatient Medications  Medication Sig Dispense Refill  . amLODipine (NORVASC) 10 MG tablet Take 1 tablet (10 mg total) by mouth daily. 90 tablet 0  . atorvastatin (LIPITOR) 40 MG tablet Take 1 tablet (40 mg total) by mouth daily at 6 PM. 90 tablet 0  . Budesonide ER 9 MG CP24 Take 9 mg by mouth daily. 14 capsule 0  .  Cholecalciferol (VITAMIN D3) 5000 units CAPS Take 5,000 Units by mouth daily.    . famotidine (PEPCID) 20 MG tablet Take 1 tablet (20 mg total) by mouth 2 (two) times daily. 60 tablet 1  . finasteride (PROSCAR) 5 MG tablet Take 5 mg by mouth daily.    Marland Kitchen HYDROcodone-acetaminophen (NORCO/VICODIN) 5-325 MG tablet TAKE 1 TABLET EVERY 6 HOURS AS NEEDED FOR MODERATE PAIN (Patient taking differently: Take 1 tablet by mouth every 6 (six) hours as needed for moderate pain or severe pain. ) 60 tablet 0  . hydrOXYzine (ATARAX/VISTARIL) 50 MG tablet Take 50 mg by mouth every 6 (six) hours as needed for anxiety.    . lidocaine-prilocaine (EMLA) cream Apply to affected area once 30 g 3  . megestrol (MEGACE ES) 625 MG/5ML suspension Take 5 mLs (625 mg total) by mouth daily. 150 mL 0  . ondansetron (ZOFRAN) 8 MG tablet Take 1 tablet (8 mg total) by mouth 2 (two) times daily as needed for refractory nausea / vomiting. Start on day 3 after chemotherapy. 30 tablet 1  . pantoprazole (PROTONIX) 40 MG tablet Take 1 tablet (40 mg total) by mouth daily. 30 tablet 2  . polyethylene glycol (MIRALAX / GLYCOLAX) 17 g packet Take 17 g by mouth daily. Also available OTC 30 each 3  . potassium chloride SA (K-DUR,KLOR-CON) 20 MEQ tablet Take 1 tablet (20 mEq total) by mouth 2 (two) times daily. 40 tablet 1  . prochlorperazine (COMPAZINE) 10 MG tablet Take 1 tablet (10 mg total) by mouth  every 6 (six) hours as needed (Nausea or vomiting). 30 tablet 1  . senna-docusate (SENOKOT-S) 8.6-50 MG tablet Take 1 tablet by mouth 2 (two) times daily. Also available OTC 60 tablet 3  . sucralfate (CARAFATE) 1 g tablet Take 1 tablet (1 g total) by mouth 4 (four) times daily -  with meals and at bedtime. 120 tablet 1  . tadalafil (CIALIS) 5 MG tablet Take 5 mg by mouth daily.    . tamsulosin (FLOMAX) 0.4 MG CAPS capsule Take 0.4 mg by mouth daily.    Marland Kitchen triamterene-hydrochlorothiazide (MAXZIDE-25) 37.5-25 MG tablet Take 1 tablet by mouth daily.       No current facility-administered medications for this visit.    PHYSICAL EXAMINATION: ECOG PERFORMANCE STATUS: 3 - Symptomatic, >50% confined to bed  Vitals:   04/02/20 1139  BP: 112/84  Pulse: (!) 116  Resp: 16  Temp: 97.6 F (36.4 C)  SpO2: 100%   Filed Weights   04/02/20 1139  Weight: 108 lb 4 oz (49.1 kg)    GENERAL:alert, no distress and comfortable SKIN: skin color, texture, turgor are normal, no rashes or significant lesions EYES: normal, Conjunctiva are pink and non-injected, sclera clear  NECK: supple, thyroid normal size, non-tender, without nodularity LYMPH:  no palpable lymphadenopathy in the cervical, axillary  LUNGS: clear to auscultation and percussion with normal breathing effort HEART: regular rate & rhythm and no murmurs and no lower extremity edema ABDOMEN:abdomen soft, non-tender and normal bowel sounds Musculoskeletal:no cyanosis of digits and no clubbing  NEURO: alert & oriented x 3 with fluent speech, no focal motor/sensory deficits  LABORATORY DATA:  I have reviewed the data as listed CBC Latest Ref Rng & Units 04/02/2020 03/29/2020 03/28/2020  WBC 4.0 - 10.5 K/uL 15.6(H) 8.0 10.8(H)  Hemoglobin 13.0 - 17.0 g/dL 11.6(L) 8.6(L) 10.6(L)  Hematocrit 39 - 52 % 36.7(L) 27.9(L) 34.7(L)  Platelets 150 - 400 K/uL 365 286 319     CMP Latest Ref Rng & Units 04/02/2020 03/29/2020 03/28/2020  Glucose 70 - 99 mg/dL 107(H) 82 107(H)  BUN 8 - 23 mg/dL 57(H) 38(H) 40(H)  Creatinine 0.61 - 1.24 mg/dL 2.18(H) 1.11 1.21  Sodium 135 - 145 mmol/L 144 137 141  Potassium 3.5 - 5.1 mmol/L 3.8 3.8 4.2  Chloride 98 - 111 mmol/L 109 111 108  CO2 22 - 32 mmol/L 21(L) 20(L) 22  Calcium 8.9 - 10.3 mg/dL 9.2 8.4(L) 9.2  Total Protein 6.5 - 8.1 g/dL 5.8(L) 5.0(L) 5.8(L)  Total Bilirubin 0.3 - 1.2 mg/dL 0.9 0.8 0.8  Alkaline Phos 38 - 126 U/L 72 72 89  AST 15 - 41 U/L 13(L) 18 27  ALT 0 - 44 U/L _0 RADIOGRAPHIC STUDIES: I have personally reviewed the  radiological images as listed and agreed with the findings in the report. No results found.   ASSESSMENT & PLAN:  Gabriel Poole is a 74 y.o. male with   1.Primary adenocarcinoma of the pyloric antrum, pT4a, PN3aM0, stage IIIB, Grade3, MSI-stable, recurrence at the anastomosis and newadenocarcinomain the gastric fundus in 10/2019, oligo liver metastasis 11/2019, HER2(+), MSS -Initially diagnosed in 02/2017.S/psurgical resection and adjuvant chemotherapyFOLFOX. Patient was not compliant with chemo, oxaliplatin stopped after 4 cycles, and 5-FU stopped after 8 cycles treatment per his request. -He unfortunately developed local gastric recurrence in July 2021 and liver metastasis in 12/2019 -His tumor is negative for PD-l1 and EBV, MMR normal, HER2(+), so he is a candidate for trastuzumab.Based on  the [place] and recent FDA approval, he would also benefit from [facility] -He started first linechemo FOLFOX q2weeks on 12/07/19.I addedTrastuzumab (Kanjinti)q2weeks and Keytruda q6weekswith C3 on 01/24/20.Goal of care is to control his disease and prolong his life. -Although he has had good response on 03/14/20 CT CAP, I stopped treatment after 03/22/20 due to ileus. If he improves, I may change his chemo to more tolerable regimen such as Xeloda or 5-fu and Herceptin.  -Since hospital discharge, he became symptomatic again. He has no appetite, low food intake, dehydration, SOB, stable lower abdominal pain. Labs reviewed, WBC 15.6, Hg 11.6, ANC 13.4, BG 107, BUN 56, Cr 2.18, protein 5.8, albumin 3.2. I discussed given recent symptoms, I recommend hospitalization again for better management. Pt notes he wants to go home today but willing to proceed with hospital admission tomorrow. Will give IV Fluids today in meantime.  -I discussed I do not plan to give anymore chemo treatment with his current condition. If he does not improve, I recommend hospice home care for better management. He will think  about it.  -F/u open   2.Abdominal pain, N&V, weight loss, Constipation, Acute bowel inflammation.  -Secondary to metastatic gastric cancer, and chemotherapy -For abdominal pain, heis taking hydrocodone. -He notes 1 BM in a week. He can only have BM with laxative use. I recommend he use Miralax or Colase. -He continues to have N&V postprandial. I recommend bland diet. I also encouraged him to use carnation breakfast, boost or ensure which has more calorie. He also has Megace and Protonix.  -Continue Zofran once in the morning and as needed,along with Compazine through the day. -Continue to f/u with Dietician  -On 03/22/20 exam he had low bowel sounds and tenderness, no rebound tenderness. Etiology is unclear, but likely related to chemo or immunotherapy. He went to ED on 03/28/20 for his symptoms. Same day CT AP showed findings consistent with small bowel obstruction versus severe ileus. Upper endoscopy was done by Dr Silverio Decamp on 03/29/20. -His abdominal pain has returned  -I refilled his norco today   3. 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  -He has required blood transfusion on 08/03/19 and 10/25/19. He has received IV Feraheme as needed since 08/08/19. Frequency slowed down on chemotherapy.   4. Ascendingaortic aneurysm. Will monitor onfuture scans  5. Smokingcessation -Hesmokes less now, 1 cigarette a day occasionally.  6. BPH and elevated PSA -He has nocturia and urinary frequency, history of BPH, and elevated PSA in July 2019 -He was previously seen by urologist before and had prostate shaved.  -PriorCT scan findings showed severe prostatomegaly. PET from 12/05/19 showed marked hypermetabolic uptake in this area. I will repeat PSA, will hold on biopsy for now due to his metastatic gastric  cancer.    7. 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 No symptoms involved in his feet. I reduced Oxaliplatin dose further with C6.   PLAN: -1 Hr IV Fluids today, pt declined hospital admission today or longer IVF today  -return to clinic tomorrow for lab and more IVF and will decide if he needs hospital admission     No problem-specific Assessment & Plan notes found for this encounter.   No orders of the defined types were placed in this encounter.  All questions  were answered. The patient knows to call the clinic with any problems, questions or concerns. No barriers to learning was detected. The total time spent in the appointment was 30 minutes.     Truitt Merle, MD 04/02/2020   I, Joslyn Devon, am acting as scribe for Truitt Merle, MD.   I have reviewed the above documentation for accuracy and completeness, and I agree with the above.

## 2020-04-03 ENCOUNTER — Other Ambulatory Visit: Payer: Self-pay

## 2020-04-03 ENCOUNTER — Other Ambulatory Visit (HOSPITAL_COMMUNITY): Payer: Self-pay | Admitting: Hematology

## 2020-04-03 ENCOUNTER — Inpatient Hospital Stay: Payer: Medicare HMO

## 2020-04-03 ENCOUNTER — Other Ambulatory Visit: Payer: Self-pay | Admitting: Hematology

## 2020-04-03 VITALS — BP 115/73 | HR 87 | Temp 97.8°F | Resp 18

## 2020-04-03 DIAGNOSIS — D630 Anemia in neoplastic disease: Secondary | ICD-10-CM

## 2020-04-03 DIAGNOSIS — E860 Dehydration: Secondary | ICD-10-CM

## 2020-04-03 DIAGNOSIS — C162 Malignant neoplasm of body of stomach: Secondary | ICD-10-CM

## 2020-04-03 DIAGNOSIS — Z95828 Presence of other vascular implants and grafts: Secondary | ICD-10-CM

## 2020-04-03 DIAGNOSIS — C163 Malignant neoplasm of pyloric antrum: Secondary | ICD-10-CM | POA: Diagnosis not present

## 2020-04-03 LAB — CBC WITH DIFFERENTIAL (CANCER CENTER ONLY)
Abs Immature Granulocytes: 0.11 10*3/uL — ABNORMAL HIGH (ref 0.00–0.07)
Basophils Absolute: 0 10*3/uL (ref 0.0–0.1)
Basophils Relative: 0 %
Eosinophils Absolute: 0 10*3/uL (ref 0.0–0.5)
Eosinophils Relative: 0 %
HCT: 28.1 % — ABNORMAL LOW (ref 39.0–52.0)
Hemoglobin: 8.8 g/dL — ABNORMAL LOW (ref 13.0–17.0)
Immature Granulocytes: 1 %
Lymphocytes Relative: 7 %
Lymphs Abs: 0.8 10*3/uL (ref 0.7–4.0)
MCH: 28.5 pg (ref 26.0–34.0)
MCHC: 31.3 g/dL (ref 30.0–36.0)
MCV: 90.9 fL (ref 80.0–100.0)
Monocytes Absolute: 0.7 10*3/uL (ref 0.1–1.0)
Monocytes Relative: 5 %
Neutro Abs: 10.9 10*3/uL — ABNORMAL HIGH (ref 1.7–7.7)
Neutrophils Relative %: 87 %
Platelet Count: 308 10*3/uL (ref 150–400)
RBC: 3.09 MIL/uL — ABNORMAL LOW (ref 4.22–5.81)
RDW: 18.2 % — ABNORMAL HIGH (ref 11.5–15.5)
WBC Count: 12.5 10*3/uL — ABNORMAL HIGH (ref 4.0–10.5)
nRBC: 0 % (ref 0.0–0.2)

## 2020-04-03 LAB — BASIC METABOLIC PANEL - CANCER CENTER ONLY
Anion gap: 10 (ref 5–15)
BUN: 71 mg/dL — ABNORMAL HIGH (ref 8–23)
CO2: 23 mmol/L (ref 22–32)
Calcium: 8.4 mg/dL — ABNORMAL LOW (ref 8.9–10.3)
Chloride: 115 mmol/L — ABNORMAL HIGH (ref 98–111)
Creatinine: 1.68 mg/dL — ABNORMAL HIGH (ref 0.61–1.24)
GFR, Estimated: 43 mL/min — ABNORMAL LOW (ref >60)
Glucose, Bld: 83 mg/dL (ref 70–99)
Potassium: 3.5 mmol/L (ref 3.5–5.1)
Sodium: 148 mmol/L — ABNORMAL HIGH (ref 135–145)

## 2020-04-03 MED ORDER — SODIUM CHLORIDE 0.9 % IV SOLN
Freq: Once | INTRAVENOUS | Status: DC
  Filled 2020-04-03: qty 250

## 2020-04-03 MED ORDER — HEPARIN SOD (PORK) LOCK FLUSH 100 UNIT/ML IV SOLN
500.0000 [IU] | Freq: Once | INTRAVENOUS | Status: AC | PRN
  Administered 2020-04-03: 500 [IU] via INTRAVENOUS
  Filled 2020-04-03: qty 5

## 2020-04-03 MED ORDER — HYDROCODONE-ACETAMINOPHEN 5-325 MG PO TABS: 1.0000 | ORAL_TABLET | Freq: Four times a day (QID) | ORAL | 0 refills | Status: DC | PRN

## 2020-04-03 MED ORDER — SODIUM CHLORIDE 0.9 % IV SOLN
INTRAVENOUS | Status: AC
  Filled 2020-04-03: qty 250

## 2020-04-03 MED ORDER — SODIUM CHLORIDE 0.9% FLUSH
10.0000 mL | INTRAVENOUS | Status: DC | PRN
  Administered 2020-04-03: 10 mL via INTRAVENOUS
  Filled 2020-04-03: qty 10

## 2020-04-03 MED FILL — HYDROCODON-APAP 5-325: 5-325 | 15 days supply | Qty: 60 | Fill #0

## 2020-04-03 NOTE — Patient Instructions (Signed)

## 2020-04-03 NOTE — Progress Notes (Signed)
Per Dr. Burr Medico, may increase IVF rate to 761ml/hr. Patient discharged in stable condition. Escorted to lobby via [facility] with family.

## 2020-04-05 ENCOUNTER — Telehealth: Payer: Self-pay | Admitting: Oncology

## 2020-04-05 ENCOUNTER — Inpatient Hospital Stay: Payer: Medicare HMO

## 2020-04-05 NOTE — Telephone Encounter (Signed)
No 11/23 los. No changes made to pt's schedule.

## 2020-04-06 ENCOUNTER — Inpatient Hospital Stay: Payer: Medicare HMO

## 2020-04-08 NOTE — Progress Notes (Signed)
[facility]   [phone] [phone]   Clinic Follow up Note   Patient Care Team: Nicholson, Sterling J IV, FNP as PCP - General (Family Medicine) , , MD as Consulting Physician (Hematology) Burton, Lacie K, NP as Nurse Practitioner (Nurse Practitioner) Byerly, Faera, MD as Consulting Physician (General Surgery) Rehman, Najeeb U, MD as Consulting Physician (Gastroenterology) Jacobs, Daniel P, MD as Attending Physician (Gastroenterology) Neff, Barbara Poole, RD as Dietitian (Nutrition)  Date of Service:  04/09/2020  CHIEF COMPLAINT: abdominal pain and poor oral intake   SUMMARY OF ONCOLOGIC HISTORY: Oncology History Overview Note  Cancer Staging 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 Microscopic Comment Several of the fragments are involved by moderately differentiated adenocarcinoma.   02/17/2017 Procedure   UPPER ENDOSCOPY  FINDINGS - Normal esophagus. - Z-line irregular, 44 cm from the incisors. - Red blood in the gastric body and in the gastric antrum. - Large gastric ulcer. Biopsied. - Erythematous mucosa in the antrum. - Normal cardia, gastric fundus, gastric body and pylorus. - Normal duodenal bulb and second portion of the duodenum. Comment: Endoscopic appearence concerning for malignant ulcer.   03/11/2017 Procedure   UPPER EUS PER DR. JACOBS Findings: 1. The esophagus was normal. 2. There was a 2-3cm ulcerated, malignant mass along the greater curvature of the stomach, approximately  mid-body. The mass was non-circumferential. 3. The duodenum was normal. Endosonographic Finding 1. The gastric mass above correlated with a hypoechoic and heterogenous non-circumferential mass that measured 2.9cm across, 7mm deep. The endosonographic borders were poorly defined and there was clear sonographic evidence suggesting invasion into and through the muscularis propria layer without evident invasion into nearby organs (uT3). 2. The duodenal lymphnode described on recent CT scan appeared reactive by US criteria. 3. No perigastric adenopathy (uN0) 4. Limited views of the liver, spleen, pancreas, bile duct, gallbladder were all normal. - Along the greater curvature of the stomach, approximately mid-body, there is a 2.9cm uT3N0 (clinical stage IIB) gastric adenocarcinoma.   04/06/2017 Imaging   CT CHEST IMPRESSION: 1. No acute cardiopulmonary abnormalities. 2. Two small pulmonary nodules are noted measuring up to 5 mm. Nonspecific but warrant attention on follow-up imaging follow up 3. Nonspecific hyperdense, possibly enhancing lesion along the dome of liver is identified. In a patient that is at increased risk for more definitive assessment of this structure with contrast enhanced MRI of the liver is advised.   04/21/2017 Imaging   MR ABDOMEN IMPRESSION: 1. Enhancing 2.9 cm mass along the lesser curvature in the gastric antrum, compatible with known gastric malignancy . 2. Mildly enlarged gastrohepatic ligament lymph node, cannot exclude nodal metastasis. 3. No definite liver metastatic disease. Hyperenhancing 0.9 cm liver dome mass remains inconclusive, although the MRI features are most suggestive of a flash filling hemangioma, and the mass has been stable for nearly 2 months. Additional subcentimeter focus of hyperenhancement in the lateral segment left liver lobe is most likely a benign transient vascular phenomenon. Recommend attention to these lesions on a follow-up MRI  abdomen without and with IV contrast   in 3-6 months. This recommendation follows ACR consensus guidelines: Management of Incidental Liver Lesions on CT: A White Paper of the ACR Incidental Findings Committee. J Am Coll Radiol 2017; 14:1429-1437. 4. Benign right adrenal adenoma.     04/22/2017 Pathology Results   Diagnosis 1. Liver, biopsy - BILE DUCT HAMARTOMA. - THERE IS NO EVIDENCE OF MALIGNANCY. 2. Lymph nodes, regional resection, portal - METASTATIC CARCINOMA IN 2 OF 6 LYMPH NODES (2/6). 3. Stomach, resection for tumor, distal - INVASIVE ADENOCARCINOMA, POORLY DIFFERENTIATED, SPANNING 3.8 CM. - PERINEURAL INVASION IS IDENTIFIED. - ADENOCARCINOMA INVOLVES THE SEROSA. - METASTATIC CARCINOMA IN 12 OF 30 LYMPH NODES (12/30), WITH EXTRACAPSULAR EXTENSION. - SEE ONCOLOGY TABLE BELOW. 4. Lymph node, biopsy, common hepatic artery - THERE IS NO EVIDENCE OF CARCINOMA IN 1 OF 1 LYMPH NODE (0/1). Microscopic Comment 3. STOMACH: Specimen: Stomach. Procedure: Partial gastrectomy. Tumor Site: Greater curvature. Tumor Size: 3.8 cm Histologic Type: Adenocarcinoma. Histologic Grade: G3: poorly differentiated. Microscopic Extent of Tumor: Adenocarcinoma involves the serosa. Margins (select all that apply): Adenoca Proximal Margin: Negative for adenocarcinoma. Distal Margin: Negative for adenocarcinoma. Treatment Effect: N/A Lymph-Vascular Invasion: Not identified. Perineural Invasion: Present. Additional findings: Chronic gastritis. Ancillary testing: Can be performed upon clinician request. 1 of 3 Duplicate copy FINAL for Whiteside, Gabriel Poole ([id number]) Microscopic Comment(continued) Lymph nodes: number examined - 37; number positive: 14 Pathologic Staging: pT4a, pN3a (JBK:gt, 04/27/17)   06/02/2017 - 09/30/2017 Adjuvant Chemotherapy   FOLFOX every 2 weeks, oxaliplatin stopped in March 2019 due to neuropathy, chemo stopped on 09/30/2017 per pt's request    10/29/2017 Imaging    10/29/2017 CT CAP  IMPRESSION: 1. Status post distal gastrectomy. No evidence for metastatic disease in the chest, abdomen, or pelvis. 2. Stable tiny right pulmonary nodules. Continued attention on follow-up recommended. 3. 9 mm hypervascular lesion in the dome of the liver is stable over multiple studies back to 04/05/2017. Continued attention on follow-up suggested. 4. Ascending thoracic aorta measures 4.2 cm diameter. Recommend annual imaging followup by CTA or MRA. This recommendation follows 2010 ACCF/AHA/AATS/ACR/ASA/SCA/SCAI/SIR/STS/SVM Guidelines for the Diagnosis and Management of Patients with Thoracic Aortic Disease. Circulation. 2010; 121: e266-e369 . 5. Marked prostatomegaly. 6.  Aortic Atherosclerois (ICD10-170.0)   05/09/2018 Imaging   05/09/2018 CT CAP IMPRESSION: 1. No definite findings to suggest metastatic disease to the chest, abdomen or pelvis. 2. Multiple small pulmonary nodules scattered throughout the lungs bilaterally measuring 5 mm or less in size, stable compared to prior studies from 2018, favored to be benign. Continued attention on future follow-up examinations is recommended. 3. Aortic atherosclerosis with ectasia of the ascending thoracic aorta (4 cm in diameter). Recommend annual imaging followup by CTA or MRA. This recommendation follows 2010 ACCF/AHA/AATS/ACR/ASA/SCA/SCAI/SIR/STS/SVM Guidelines for the Diagnosis and Management of Patients with Thoracic Aortic Disease. Circulation. 2010; 121: e266-e369. 4. Multiple small calculi lying dependently in the right-side of the urinary bladder. 5. Severe prostatomegaly. 6. Additional incidental findings, as above.   04/04/2019 Imaging   CT AP IMPRESSION: 1. Redemonstrated postoperative findings of distal gastrectomy and gastrojejunostomy (series 2, image 18, series 4, image 27). No evidence of malignant recurrence or metastatic disease in the abdomen or pelvis. 2. Subcutaneous body fat is  substantially diminished in comparison to prior examination, in keeping with reported history of weight loss. 3. Gross prostatomegaly and urinary bladder wall thickening, likely due to chronic outlet obstruction. 4.  Aortic Atherosclerosis (ICD10-I70.0).   08/16/2019 Imaging   CT CAP w contrast IMPRESSION: 1. No acute findings within the chest, abdomen or   pelvis. No specific findings identified to suggest residual or recurrent tumor or metastatic disease. 2. Small nonspecific pulmonary nodules are unchanged. 3. Marked enlargement of the prostate gland. 4. Multiple tiny bladder stones. 5. Aortic atherosclerosis. Aortic Atherosclerosis (ICD10-I70.0).   11/02/2019 Procedure   EGD impression - Normal hypopharynx. - Normal esophagus. - Z-line regular, 45 cm from the incisors. - Patent Billroth II gastrojejunostomy was found. 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 Biopsied. - Gastric ulcer with raised margins appears to be separate lesion. Biopsied.  comment: recurrent tumor at anastamosis appears to be separate form fundal lesion. these lesions are the source of chronic blood loss.  Colonoscopy impression: - Perianal skin tags found on perianal exam. - The entire examined colon is normal. - External and internal hemorrhoids. - No specimens collected.   11/02/2019 Pathology Results   FINAL MICROSCOPIC DIAGNOSIS:  A. STOMACH, BIOPSY:  -  Poorly differentiated carcinoma  -  See comment  B. STOMACH, FUNDUS, BIOPSY:  -  Adenocarcinoma  -  See comment  COMMENT:  A and B.  The carcinoma in part A is more poorly differentiated than  what is seen in part B but both are favored to be adenocarcinoma.  Dr.  Jeannie Done reviewed the case and agrees with the above diagnosis. Dr.  Laural Golden was notified of these results on November 03, 2019.    12/05/2019 PET scan   IMPRESSION: Marked hypermetabolic activity within the thickened stomach in this patient with known gastric cancer with associated  metastatic disease to the liver and small lymph nodes in the area of the hepatic gastric recess that are suspicious based on location and size though not particularly FDG avid.   Small lymph nodes along the posterolateral margin of the aorta with mild increased FDG uptake (image 107) (SUVmax = 2.8 these are approximately 6-7 mm and there are several small nodes tracking beneath the LEFT retrocrural region. Attention on follow-up)   Increased FDG uptake in the central portion of the prostate likely within the prosthetic urethra given the marked hypermetabolic features in the central location. Would however consider correlation with PSA as clinically warranted. Prostate is markedly enlarged otherwise with heterogeneous FDG uptake.   Aortic Atherosclerosis (ICD10-I70.0).    12/07/2019 - 03/22/2020 Chemotherapy   FOLFOX every 2 weeks starting 12/07/19. Add Transtuzumab (Kanjinti) q2 weeks and Keytruda q6weeks with C3 on 01/24/20. Held since 03/22/20 due to chest congestion and bowel inflammation. Ultimately stopped 03/29/20 due to suspected relation to ileus.    12/14/2019 Pathology Results   FINAL MICROSCOPIC DIAGNOSIS:   A. LIVER, NEEDLE CORE BIOPSY:  - Poorly differentiated adenocarcinoma, see comment.   COMMENT:   The tumor has a similar appearance to the patient's recent gastric  biopsies ([id number]). There is sufficient material for additional  testing if requested.    03/14/2020 Imaging   CT CAP IMPRESSION: 1. The small hypodense lesion in segment 4 of the liver was previously hypermetabolic, and is subjectively reduced in size compared to prior PET-CT. 2. Dilated loops of small bowel anteriorly without a well-defined lead point for obstruction. This could be from ileus or low-grade partial small bowel obstruction. 3. Continued gastric wall thickening is not entirely specific but residual gastric tumor is not excluded. Postoperative findings along the residual stomach. 4.  Cannot exclude wall thickening in the nondistended ascending colon and hepatic flexure. 5. Diffuse low-grade subcutaneous and mesenteric edema. Trace ascites in the pelvis. Appearance favors third spacing of fluid. 6. Bilateral airway thickening with airway plugging in  both lower lobes and resulting airspace opacities due to atelectasis and possibly pneumonia in the lower lobes. 7. Marked prostatomegaly. 8. Stable 4.2 cm ascending thoracic aortic aneurysm. 9. Aortic atherosclerosis.   03/28/2020 Imaging   CT AP  IMPRESSION: 1. Fluid-filled stomach and proximal small bowel. Persistent distension of the fluid-filled small bowel to 4.8 cm compared to 4.6 cm on 03/15/2019. Findings consistent with small bowel obstruction versus severe ileus. No pneumatosis or portal venous gas. No intraperitoneal free air. 2. Oral contrast from 03/24/2017 remains in the LEFT colon. 3. Post partial gastrectomy with Roux-en-Y anatomy.     03/29/2020 Procedure   Upper Endoscopy by Dr Silverio Decamp 03/29/20  IMPRESSION - LA Grade D reflux and erosive esophagitis with no bleeding. - Friable gastric mucosa. - Stenosed Billroth II gastrojejunostomy was found, characterized by friable mucosa and severe stenosis. - A medium amount of a phytobezoar in the stomach. Removal was successful.      CURRENT THERAPY:  Symptom Management, Palliative care, chemo on hold for now   INTERVAL HISTORY:  Gabriel Poole is here for a follow up. He presents to the clinic alone. He notes he is not doing well. He did not go to hospital last week. He notes he still cannot eat or drink much water. He notes he has upper abdominal pain when he tries. He notes he and someone else went to pick up his latest hydrocodone prescription. He notes he is now interested in going to hospital again. He notes he had 2 episodes black loose stools yesterday. He did eat prunes.  He notes his girlfriend comes and goes, so support from her is not  consistent.    REVIEW OF SYSTEMS:   Constitutional: Denies fevers, chills or abnormal weight loss (+) Low food intake (+) weakness Eyes: Denies blurriness of vision Ears, nose, mouth, throat, and face: Denies mucositis or sore throat Respiratory: Denies cough, dyspnea or wheezes Cardiovascular: Denies palpitation, chest discomfort or lower extremity swelling Gastrointestinal:  Denies nausea, heartburn (+) Black stool (+) upper abdominal Skin: Denies abnormal skin rashes Lymphatics: Denies new lymphadenopathy or easy bruising Neurological:Denies numbness, tingling or new weaknesses Behavioral/Psych: Mood is stable, no new changes  All other systems were reviewed with the patient and are negative.  MEDICAL HISTORY:  Past Medical History:  Diagnosis Date  . Arthritis   . Cancer [facility]) dx oct 02-2017   stomach  . Gout   . Heart murmur   . Hyperlipidemia   . Hypertension   . Stomach cancer [facility])     SURGICAL HISTORY: Past Surgical History:  Procedure Laterality Date  . BIOPSY  11/02/2019   Procedure: BIOPSY;  Surgeon: Rogene Houston, MD;  [facility];  Service: Endoscopy;;  gastric  . COLONOSCOPY    . COLONOSCOPY N/A 11/02/2019   Procedure: COLONOSCOPY;  Surgeon: Rogene Houston, MD;  [facility];  Service: Endoscopy;  Laterality: N/A;  100  . ESOPHAGOGASTRODUODENOSCOPY N/A 02/17/2017   Procedure: ESOPHAGOGASTRODUODENOSCOPY (EGD);  Surgeon: Rogene Houston, MD;  [facility];  Service: Endoscopy;  Laterality: N/A;  3:00  . ESOPHAGOGASTRODUODENOSCOPY N/A 11/02/2019   Procedure: ESOPHAGOGASTRODUODENOSCOPY (EGD);  Surgeon: Rogene Houston, MD;  [facility];  Service: Endoscopy;  Laterality: N/A;  . ESOPHAGOGASTRODUODENOSCOPY (EGD) WITH PROPOFOL N/A 03/29/2020   Procedure: ESOPHAGOGASTRODUODENOSCOPY (EGD) WITH PROPOFOL;  Surgeon: Mauri Pole, MD;  Location: [facility];  Service: Endoscopy;  Laterality: N/A;  . EUS N/A  03/11/2017   Procedure: UPPER  ENDOSCOPIC ULTRASOUND (EUS) LINEAR;  Surgeon: Jacobs, Daniel P, MD;  Location: [facility];  Service: Endoscopy;  Laterality: N/A;  . EUS N/A 03/11/2017   Procedure: UPPER ENDOSCOPIC ULTRASOUND (EUS) RADIAL;  Surgeon: Jacobs, Daniel P, MD;  Location: [facility];  Service: Endoscopy;  Laterality: N/A;  . FINGER SURGERY     Lt middle   . FOREIGN BODY REMOVAL  03/29/2020   Procedure: FOREIGN BODY REMOVAL;  Surgeon: Nandigam, Kavitha V, MD;  Location: [facility];  Service: Endoscopy;;  . GASTRECTOMY N/A 04/22/2017   Procedure: PARTIAL GASTRECTOMY;  Surgeon: Byerly, Faera, MD;  Location: [facility];  Service: General;  Laterality: N/A;  . GASTROJEJUNOSTOMY N/A 04/22/2017   Procedure: JEJUNAL FEEDING TUBE PLACEMENT;  Surgeon: Byerly, Faera, MD;  Location: [facility];  Service: General;  Laterality: N/A;  . HYDROCELE EXCISION  02/12/2011   Procedure: HYDROCELECTOMY ADULT;  Surgeon: Mohammad I Javaid;  Location: [facility];  Service: Urology;  Laterality: Left;  . LAPAROSCOPY N/A 04/22/2017   Procedure: LAPAROSCOPY DIAGNOSTIC ERAS PATHWAY;  Surgeon: Byerly, Faera, MD;  Location: [facility];  Service: General;  Laterality: N/A;  EPIDURAL  . PORTACATH PLACEMENT N/A 05/28/2017   Procedure: INSERTION PORT-A-CATH ERAS PATHWAY;  Surgeon: Byerly, Faera, MD;  Location: [facility];  Service: General;  Laterality: N/A;    I have reviewed the social history and family history with the patient and they are unchanged from previous note.  ALLERGIES:  is allergic to penicillins.  MEDICATIONS:  Current Outpatient Medications  Medication Sig Dispense Refill  . amLODipine (NORVASC) 10 MG tablet Take 1 tablet (10 mg total) by mouth daily. 90 tablet 0  . atorvastatin (LIPITOR) 40 MG tablet Take 1 tablet (40 mg total) by mouth daily at 6 PM. 90 tablet 0  . Budesonide ER 9 MG CP24 Take 9 mg by mouth daily. 14 capsule 0  . Cholecalciferol (VITAMIN D3) 5000 units CAPS Take 5,000 Units by  mouth daily.    . famotidine (PEPCID) 20 MG tablet Take 1 tablet (20 mg total) by mouth 2 (two) times daily. 60 tablet 1  . finasteride (PROSCAR) 5 MG tablet Take 5 mg by mouth daily.    . HYDROcodone-acetaminophen (NORCO/VICODIN) 5-325 MG tablet Take 1 tablet by mouth every 6 (six) hours as needed for moderate pain or severe pain. 60 tablet 0  . hydrOXYzine (ATARAX/VISTARIL) 50 MG tablet Take 50 mg by mouth every 6 (six) hours as needed for anxiety.    . lidocaine-prilocaine (EMLA) cream Apply to affected area once 30 g 3  . megestrol (MEGACE ES) 625 MG/5ML suspension Take 5 mLs (625 mg total) by mouth daily. 150 mL 0  . ondansetron (ZOFRAN) 8 MG tablet Take 1 tablet (8 mg total) by mouth 2 (two) times daily as needed for refractory nausea / vomiting. Start on day 3 after chemotherapy. 30 tablet 1  . pantoprazole (PROTONIX) 40 MG tablet Take 1 tablet (40 mg total) by mouth daily. 30 tablet 2  . polyethylene glycol (MIRALAX / GLYCOLAX) 17 g packet Take 17 g by mouth daily. Also available OTC 30 each 3  . potassium chloride SA (K-DUR,KLOR-CON) 20 MEQ tablet Take 1 tablet (20 mEq total) by mouth 2 (two) times daily. 40 tablet 1  . prochlorperazine (COMPAZINE) 10 MG tablet Take 1 tablet (10 mg total) by mouth every 6 (six) hours as needed (Nausea or vomiting). 30 tablet 1  . senna-docusate (SENOKOT-S) 8.6-50 MG tablet Take 1 tablet by mouth 2 (two) times daily. Also   available OTC 60 tablet 3  . sucralfate (CARAFATE) 1 g tablet Take 1 tablet (1 g total) by mouth 4 (four) times daily -  with meals and at bedtime. 120 tablet 1  . tadalafil (CIALIS) 5 MG tablet Take 5 mg by mouth daily.    . tamsulosin (FLOMAX) 0.4 MG CAPS capsule Take 0.4 mg by mouth daily.    . triamterene-hydrochlorothiazide (MAXZIDE-25) 37.5-25 MG tablet Take 1 tablet by mouth daily.    . GAVILAX 17 GM/SCOOP powder Take by mouth.    . morphine (MS CONTIN) 15 MG 12 hr tablet Take 1 tablet (15 mg total) by mouth every 12 (twelve) hours.  30 tablet 0   No current facility-administered medications for this visit.    PHYSICAL EXAMINATION: ECOG PERFORMANCE STATUS: 3 - Symptomatic, >50% confined to bed Blood pressure 112/77, heart rate 84, temperature 36.3, respirate of 14, pulse ox 94% on room air GENERAL:alert, cachectic appearing, in wheelchair SKIN: skin color, texture, turgor are normal, no rashes or significant lesions EYES: normal, Conjunctiva are pink and non-injected, sclera clear  NECK: supple, thyroid normal size, non-tender, without nodularity LYMPH:  no palpable lymphadenopathy in the cervical, axillary  LUNGS: clear to auscultation and percussion with normal breathing effort HEART: regular rate & rhythm and no murmurs (+) lower extremity edema ABDOMEN:abdomen soft, non-tender and normal bowel sounds (+) Epigastric tenderness, no organomegaly Musculoskeletal:no cyanosis of digits and no clubbing  NEURO: alert & oriented x 3 with fluent speech, no focal motor/sensory deficits  LABORATORY DATA:  I have reviewed the data as listed CBC Latest Ref Rng & Units 04/09/2020 04/03/2020 04/02/2020  WBC 4.0 - 10.5 K/uL 11.5(H) 12.5(H) 15.6(H)  Hemoglobin 13.0 - 17.0 g/dL 8.2(Poole) 8.8(Poole) 11.6(Poole)  Hematocrit 39 - 52 % 27.5(Poole) 28.1(Poole) 36.7(Poole)  Platelets 150 - 400 K/uL 295 308 365     CMP Latest Ref Rng & Units 04/09/2020 04/03/2020 04/02/2020  Glucose 70 - 99 mg/dL 85 83 107(H)  BUN 8 - 23 mg/dL 62(H) 71(H) 57(H)  Creatinine 0.61 - 1.24 mg/dL 1.36(H) 1.68(H) 2.18(H)  Sodium 135 - 145 mmol/Poole 149(H) 148(H) 144  Potassium 3.5 - 5.1 mmol/Poole 3.5 3.5 3.8  Chloride 98 - 111 mmol/Poole 118(H) 115(H) 109  CO2 22 - 32 mmol/Poole 22 23 21(Poole)  Calcium 8.9 - 10.3 mg/dL 9.4 8.4(Poole) 9.2  Total Protein 6.5 - 8.1 g/dL 5.1(Poole) - 5.8(Poole)  Total Bilirubin 0.3 - 1.2 mg/dL 0.9 - 0.9  Alkaline Phos 38 - 126 U/Poole 73 - 72  AST 15 - 41 U/Poole 25 - 13(Poole)  ALT 0 - 44 U/Poole 16 - 7      RADIOGRAPHIC STUDIES: I have personally reviewed the radiological images  as listed and agreed with the findings in the report. No results found.   ASSESSMENT & PLAN:  Gabriel Poole is a 74 y.o. male with    1.Symptom Management: Abdominal pain,N&V,weight loss, Constipation, Dysphagia, Epigastric pain, Black Stool.  -Secondary to metastatic gastric cancer  -For abdominal pain, heis taking hydrocodone. I refilled 04/02/20. Will given Oxycodone in clinic today (04/09/20).  -I will call in MS contin 15mg q12h today  -He has had significant constipation last week. I reviewed Laxative use. Yesterday he had 2 episode of black loose stool, no BM today  -He continues to have N&V postprandial. He also has pain in throat to chest when he eats and drinks. He has very little food and liquid intake now. He continues to lose weight with increased weakness.  -I   recommended he use carnation breakfast, boost or ensure which has more calorie. He also hasMegace and Protonix.  -Continue Zofran once in the morning and as needed,along with Compazine through the day.   2.Primary adenocarcinoma of the pyloric antrum, pT4a, PN3aM0, stage IIIB, Grade3, MSI-stable, recurrence at the anastomosis and newadenocarcinomain the gastric fundus in 10/2019, oligo liver metastasis 11/2019, HER2(+), MSS, PD-l1 and EBV -Initially diagnosed in 02/2017.S/psurgical resection and adjuvant chemotherapyFOLFOX. Patient was not compliant with chemo, oxaliplatin stopped after 4 cycles, and 5-FU stopped after 8 cycles treatment per his request. -He unfortunately developed local gastric recurrence in July 2021 and liver metastasis in 12/2019 -He started first linechemo FOLFOX q2weeks on 12/07/19.I addedTrastuzumab (Kanjinti)q2weeks and Keytruda q6weekswith C3 on 01/24/20.Although he has had good response on 03/14/20 CT CAP, I stopped treatment after 03/22/20 due to ileus. Given his poor symptom management at home and recurrent hospital visits, I do not recommend further treatment as he is not able to  tolerate.  -I discussed palliative care. I recommend hospice home care to better manage his symptoms and if not able to continue at home he can go to their residential facility. He is interested in hospice home care at this time.  -I spoke with his son today, he is also in agreement with hospice -I will send referral.    3. 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  -He has required blood transfusion on 08/03/19 and 10/25/19. He has received IV Feraheme as needed since 08/08/19. Frequency slowed down on chemotherapy.  4. Ascendingaortic aneurysm. Will monitor onfuture scans  5. Smokingcessation -Hesmokes less now, 1 cigarette a day occasionally.  6. BPH and elevated PSA  7. 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  -I also spoke with his son today, and called his ex-wife and left her a message    No problem-specific Assessment & Plan notes found for this encounter.   No orders of the defined types were placed in this encounter.  All questions were answered. The patient knows to call the clinic with any problems, questions or concerns. No barriers to learning was detected. The total time spent in the appointment was 40 minutes.     YaTruitt MerleMD 04/09/2020   I, AmJoslyn Devonam acting as scribe for YaTruitt MerleMD.   I have reviewed the above documentation for accuracy and completeness, and I agree with the above.

## 2020-04-09 ENCOUNTER — Inpatient Hospital Stay: Payer: Medicare HMO

## 2020-04-09 ENCOUNTER — Encounter: Payer: Self-pay | Admitting: Hematology

## 2020-04-09 ENCOUNTER — Other Ambulatory Visit: Payer: Self-pay

## 2020-04-09 ENCOUNTER — Inpatient Hospital Stay (HOSPITAL_BASED_OUTPATIENT_CLINIC_OR_DEPARTMENT_OTHER): Payer: Medicare HMO | Admitting: Hematology

## 2020-04-09 VITALS — BP 112/77 | HR 84 | Temp 97.4°F | Resp 14 | Ht 70.0 in

## 2020-04-09 DIAGNOSIS — Z95828 Presence of other vascular implants and grafts: Secondary | ICD-10-CM

## 2020-04-09 DIAGNOSIS — D500 Iron deficiency anemia secondary to blood loss (chronic): Secondary | ICD-10-CM

## 2020-04-09 DIAGNOSIS — E860 Dehydration: Secondary | ICD-10-CM

## 2020-04-09 DIAGNOSIS — D630 Anemia in neoplastic disease: Secondary | ICD-10-CM

## 2020-04-09 DIAGNOSIS — C162 Malignant neoplasm of body of stomach: Secondary | ICD-10-CM

## 2020-04-09 DIAGNOSIS — C163 Malignant neoplasm of pyloric antrum: Secondary | ICD-10-CM | POA: Diagnosis not present

## 2020-04-09 DIAGNOSIS — D649 Anemia, unspecified: Secondary | ICD-10-CM

## 2020-04-09 LAB — CBC WITH DIFFERENTIAL (CANCER CENTER ONLY)
Abs Immature Granulocytes: 0.15 10*3/uL — ABNORMAL HIGH (ref 0.00–0.07)
Basophils Absolute: 0 10*3/uL (ref 0.0–0.1)
Basophils Relative: 0 %
Eosinophils Absolute: 0 10*3/uL (ref 0.0–0.5)
Eosinophils Relative: 0 %
HCT: 27.5 % — ABNORMAL LOW (ref 39.0–52.0)
Hemoglobin: 8.2 g/dL — ABNORMAL LOW (ref 13.0–17.0)
Immature Granulocytes: 1 %
Lymphocytes Relative: 8 %
Lymphs Abs: 0.9 10*3/uL (ref 0.7–4.0)
MCH: 29.2 pg (ref 26.0–34.0)
MCHC: 29.8 g/dL — ABNORMAL LOW (ref 30.0–36.0)
MCV: 97.9 fL (ref 80.0–100.0)
Monocytes Absolute: 0.5 10*3/uL (ref 0.1–1.0)
Monocytes Relative: 5 %
Neutro Abs: 9.9 10*3/uL — ABNORMAL HIGH (ref 1.7–7.7)
Neutrophils Relative %: 86 %
Platelet Count: 295 10*3/uL (ref 150–400)
RBC: 2.81 MIL/uL — ABNORMAL LOW (ref 4.22–5.81)
RDW: 19.8 % — ABNORMAL HIGH (ref 11.5–15.5)
WBC Count: 11.5 10*3/uL — ABNORMAL HIGH (ref 4.0–10.5)
nRBC: 0 % (ref 0.0–0.2)

## 2020-04-09 LAB — CMP (CANCER CENTER ONLY)
ALT: 16 U/L (ref 0–44)
AST: 25 U/L (ref 15–41)
Albumin: 2.8 g/dL — ABNORMAL LOW (ref 3.5–5.0)
Alkaline Phosphatase: 73 U/L (ref 38–126)
Anion gap: 9 (ref 5–15)
BUN: 62 mg/dL — ABNORMAL HIGH (ref 8–23)
CO2: 22 mmol/L (ref 22–32)
Calcium: 9.4 mg/dL (ref 8.9–10.3)
Chloride: 118 mmol/L — ABNORMAL HIGH (ref 98–111)
Creatinine: 1.36 mg/dL — ABNORMAL HIGH (ref 0.61–1.24)
GFR, Estimated: 55 mL/min — ABNORMAL LOW (ref >60)
Glucose, Bld: 85 mg/dL (ref 70–99)
Potassium: 3.5 mmol/L (ref 3.5–5.1)
Sodium: 149 mmol/L — ABNORMAL HIGH (ref 135–145)
Total Bilirubin: 0.9 mg/dL (ref 0.3–1.2)
Total Protein: 5.1 g/dL — ABNORMAL LOW (ref 6.5–8.1)

## 2020-04-09 MED ORDER — SODIUM CHLORIDE 0.9 % IV SOLN
Freq: Once | INTRAVENOUS | Status: AC
  Filled 2020-04-09: qty 250

## 2020-04-09 MED ORDER — SODIUM CHLORIDE 0.9% FLUSH
10.0000 mL | INTRAVENOUS | Status: DC | PRN
  Administered 2020-04-09: 10 mL via INTRAVENOUS
  Filled 2020-04-09: qty 10

## 2020-04-09 MED ORDER — HEPARIN SOD (PORK) LOCK FLUSH 100 UNIT/ML IV SOLN
500.0000 [IU] | Freq: Once | INTRAVENOUS | Status: AC | PRN
  Administered 2020-04-09: 500 [IU] via INTRAVENOUS
  Filled 2020-04-09: qty 5

## 2020-04-09 MED ORDER — OXYCODONE HCL 5 MG PO TABS
5.0000 mg | ORAL_TABLET | Freq: Once | ORAL | Status: AC
  Administered 2020-04-09: 5 mg via ORAL

## 2020-04-09 MED ORDER — MORPHINE SULFATE ER 15 MG PO TBCR
15.0000 mg | EXTENDED_RELEASE_TABLET | Freq: Two times a day (BID) | ORAL | 0 refills | Status: DC
Start: 2020-04-09 — End: 2020-04-23

## 2020-04-09 MED ORDER — OXYCODONE HCL 5 MG PO TABS
ORAL_TABLET | ORAL | Status: AC
  Filled 2020-04-09: qty 1

## 2020-04-09 NOTE — Patient Instructions (Signed)

## 2020-04-09 NOTE — Progress Notes (Signed)
MD Burr Medico spoke over phone with pt's son regarding d/c plans.  Ok to d/c per MD, stable, escorted via w/c to exit with belongings, friend driving pt home.

## 2020-04-10 ENCOUNTER — Ambulatory Visit: Payer: Medicare HMO | Admitting: Nurse Practitioner

## 2020-04-10 ENCOUNTER — Other Ambulatory Visit: Payer: Medicare HMO

## 2020-04-10 ENCOUNTER — Other Ambulatory Visit: Payer: Self-pay

## 2020-04-10 ENCOUNTER — Telehealth: Payer: Self-pay

## 2020-04-10 ENCOUNTER — Ambulatory Visit: Payer: Medicare HMO

## 2020-04-10 ENCOUNTER — Encounter: Payer: Medicare HMO | Admitting: Nutrition

## 2020-04-10 DIAGNOSIS — C162 Malignant neoplasm of body of stomach: Secondary | ICD-10-CM

## 2020-04-10 NOTE — Telephone Encounter (Signed)
Referral, demographics, last ov note faxed to [facility].

## 2020-04-11 ENCOUNTER — Telehealth: Payer: Self-pay | Admitting: Hematology

## 2020-04-11 NOTE — Telephone Encounter (Signed)
Made no changes to pt's schedule based off of 11/30 los

## 2020-04-12 ENCOUNTER — Telehealth: Payer: Self-pay

## 2020-04-12 NOTE — Telephone Encounter (Signed)
I received a call from [place] from hospice of [facility] stating that they were unable to locate Mr Lopresti's pain medication on today's visit.  I spoke with Egan and Mr Siems's hydorcodone and MS Contin prescriptions have been picked up. I reviewed this with Dr Burr Medico.  She will defer to hospice physicians to manage patient's care.  Dr. Burr Medico will not be the attending physician. I spoke with Tabitha later in the day and told her the above information.

## 2020-04-16 ENCOUNTER — Encounter (HOSPITAL_COMMUNITY): Payer: Self-pay

## 2020-04-16 ENCOUNTER — Inpatient Hospital Stay (HOSPITAL_COMMUNITY)
Admission: EM | Admit: 2020-04-16 | Discharge: 2020-04-23 | DRG: 951 | Disposition: A | Discharge status: Hospice | Attending: Internal Medicine | Admitting: Internal Medicine

## 2020-04-16 ENCOUNTER — Other Ambulatory Visit: Payer: Self-pay

## 2020-04-16 DIAGNOSIS — F0391 Unspecified dementia with behavioral disturbance: Secondary | ICD-10-CM | POA: Diagnosis present

## 2020-04-16 DIAGNOSIS — I10 Essential (primary) hypertension: Secondary | ICD-10-CM | POA: Diagnosis present

## 2020-04-16 DIAGNOSIS — E860 Dehydration: Secondary | ICD-10-CM

## 2020-04-16 DIAGNOSIS — Z515 Encounter for palliative care: Secondary | ICD-10-CM | POA: Diagnosis not present

## 2020-04-16 DIAGNOSIS — Z880 Allergy status to penicillin: Secondary | ICD-10-CM

## 2020-04-16 DIAGNOSIS — Z87891 Personal history of nicotine dependence: Secondary | ICD-10-CM

## 2020-04-16 DIAGNOSIS — R451 Restlessness and agitation: Secondary | ICD-10-CM

## 2020-04-16 DIAGNOSIS — Z79899 Other long term (current) drug therapy: Secondary | ICD-10-CM

## 2020-04-16 DIAGNOSIS — Z7901 Long term (current) use of anticoagulants: Secondary | ICD-10-CM

## 2020-04-16 DIAGNOSIS — R627 Adult failure to thrive: Secondary | ICD-10-CM

## 2020-04-16 DIAGNOSIS — E870 Hyperosmolality and hypernatremia: Secondary | ICD-10-CM

## 2020-04-16 DIAGNOSIS — Z806 Family history of leukemia: Secondary | ICD-10-CM

## 2020-04-16 DIAGNOSIS — Z66 Do not resuscitate: Secondary | ICD-10-CM

## 2020-04-16 DIAGNOSIS — Z20822 Contact with and (suspected) exposure to covid-19: Secondary | ICD-10-CM | POA: Diagnosis present

## 2020-04-16 DIAGNOSIS — R64 Cachexia: Secondary | ICD-10-CM | POA: Diagnosis present

## 2020-04-16 DIAGNOSIS — Z85028 Personal history of other malignant neoplasm of stomach: Secondary | ICD-10-CM

## 2020-04-16 DIAGNOSIS — R531 Weakness: Secondary | ICD-10-CM | POA: Diagnosis present

## 2020-04-16 DIAGNOSIS — Z8249 Family history of ischemic heart disease and other diseases of the circulatory system: Secondary | ICD-10-CM

## 2020-04-16 DIAGNOSIS — E785 Hyperlipidemia, unspecified: Secondary | ICD-10-CM | POA: Diagnosis present

## 2020-04-16 LAB — CBC WITH DIFFERENTIAL/PLATELET
Abs Immature Granulocytes: 0.06 10*3/uL (ref 0.00–0.07)
Basophils Absolute: 0 10*3/uL (ref 0.0–0.1)
Basophils Relative: 0 %
Eosinophils Absolute: 0 10*3/uL (ref 0.0–0.5)
Eosinophils Relative: 0 %
HCT: 26 % — ABNORMAL LOW (ref 39.0–52.0)
Hemoglobin: 7.5 g/dL — ABNORMAL LOW (ref 13.0–17.0)
Immature Granulocytes: 1 %
Lymphocytes Relative: 6 %
Lymphs Abs: 0.5 10*3/uL — ABNORMAL LOW (ref 0.7–4.0)
MCH: 29.2 pg (ref 26.0–34.0)
MCHC: 28.8 g/dL — ABNORMAL LOW (ref 30.0–36.0)
MCV: 101.2 fL — ABNORMAL HIGH (ref 80.0–100.0)
Monocytes Absolute: 0.2 10*3/uL (ref 0.1–1.0)
Monocytes Relative: 3 %
Neutro Abs: 7.4 10*3/uL (ref 1.7–7.7)
Neutrophils Relative %: 90 %
Platelets: 221 10*3/uL (ref 150–400)
RBC: 2.57 MIL/uL — ABNORMAL LOW (ref 4.22–5.81)
RDW: 18.1 % — ABNORMAL HIGH (ref 11.5–15.5)
WBC: 8.1 10*3/uL (ref 4.0–10.5)
nRBC: 0.2 % (ref 0.0–0.2)

## 2020-04-16 LAB — URINALYSIS, ROUTINE W REFLEX MICROSCOPIC
Bacteria, UA: NONE SEEN
Bilirubin Urine: NEGATIVE
Glucose, UA: NEGATIVE mg/dL
Ketones, ur: NEGATIVE mg/dL
Nitrite: NEGATIVE
Protein, ur: 30 mg/dL — AB
Specific Gravity, Urine: 1.021 (ref 1.005–1.030)
pH: 5 (ref 5.0–8.0)

## 2020-04-16 LAB — COMPREHENSIVE METABOLIC PANEL
ALT: 24 U/L (ref 0–44)
AST: 30 U/L (ref 15–41)
Albumin: 2.7 g/dL — ABNORMAL LOW (ref 3.5–5.0)
Alkaline Phosphatase: 63 U/L (ref 38–126)
Anion gap: 8 (ref 5–15)
BUN: 91 mg/dL — ABNORMAL HIGH (ref 8–23)
CO2: 19 mmol/L — ABNORMAL LOW (ref 22–32)
Calcium: 8.9 mg/dL (ref 8.9–10.3)
Chloride: 123 mmol/L — ABNORMAL HIGH (ref 98–111)
Creatinine, Ser: 1.63 mg/dL — ABNORMAL HIGH (ref 0.61–1.24)
GFR, Estimated: 44 mL/min — ABNORMAL LOW (ref >60)
Glucose, Bld: 132 mg/dL — ABNORMAL HIGH (ref 70–99)
Potassium: 3.5 mmol/L (ref 3.5–5.1)
Sodium: 150 mmol/L — ABNORMAL HIGH (ref 135–145)
Total Bilirubin: 1.6 mg/dL — ABNORMAL HIGH (ref 0.3–1.2)
Total Protein: 5.2 g/dL — ABNORMAL LOW (ref 6.5–8.1)

## 2020-04-16 MED ORDER — SODIUM CHLORIDE 0.9 % IV SOLN: Freq: Once | INTRAVENOUS | Status: AC

## 2020-04-16 MED ORDER — HYDROCODONE-ACETAMINOPHEN 5-325 MG PO TABS
1.0000 | ORAL_TABLET | Freq: Once | ORAL | Status: AC
  Administered 2020-04-16: 1 via ORAL
  Filled 2020-04-16: qty 1

## 2020-04-16 MED ORDER — AMLODIPINE BESYLATE 5 MG PO TABS
10.0000 mg | ORAL_TABLET | Freq: Every day | ORAL | Status: DC
  Administered 2020-04-17 – 2020-04-19 (×3): 10 mg via ORAL
  Filled 2020-04-16 (×4): qty 2

## 2020-04-16 MED ORDER — MORPHINE SULFATE ER 15 MG PO TBCR
15.0000 mg | EXTENDED_RELEASE_TABLET | Freq: Two times a day (BID) | ORAL | Status: DC
  Administered 2020-04-17 – 2020-04-19 (×6): 15 mg via ORAL
  Filled 2020-04-16 (×8): qty 1

## 2020-04-16 MED ORDER — SODIUM CHLORIDE 0.9 % IV BOLUS
1000.0000 mL | Freq: Once | INTRAVENOUS | Status: AC
  Administered 2020-04-16: 1000 mL via INTRAVENOUS

## 2020-04-16 MED ORDER — ATORVASTATIN CALCIUM 40 MG PO TABS
40.0000 mg | ORAL_TABLET | Freq: Every day | ORAL | Status: DC
  Administered 2020-04-17 – 2020-04-19 (×3): 40 mg via ORAL
  Filled 2020-04-16 (×3): qty 1

## 2020-04-16 MED ORDER — PANTOPRAZOLE SODIUM 40 MG PO TBEC
40.0000 mg | DELAYED_RELEASE_TABLET | Freq: Every day | ORAL | Status: DC
  Administered 2020-04-17 – 2020-04-19 (×3): 40 mg via ORAL
  Filled 2020-04-16 (×4): qty 1

## 2020-04-16 MED ORDER — FAMOTIDINE 20 MG PO TABS: 20.0000 mg | ORAL_TABLET | Freq: Two times a day (BID) | ORAL | Status: DC

## 2020-04-16 MED ORDER — SUCRALFATE 1 G PO TABS
1.0000 g | ORAL_TABLET | Freq: Three times a day (TID) | ORAL | Status: DC
  Administered 2020-04-17 – 2020-04-19 (×10): 1 g via ORAL
  Filled 2020-04-16 (×12): qty 1

## 2020-04-16 MED ORDER — HYDROCODONE-ACETAMINOPHEN 5-325 MG PO TABS: 1.0000 | ORAL_TABLET | Freq: Four times a day (QID) | ORAL | Status: DC | PRN

## 2020-04-16 NOTE — ED Triage Notes (Signed)
Pt brought to ED via [facility]. Pt has not eat or drank in 3 days. Pt cancer pt, c/o abdominal pain and no appetite.
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
## 2020-04-16 NOTE — ED Notes (Signed)
Pt moved to room #3.
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
## 2020-04-16 NOTE — ED Notes (Signed)
Pt given ensure 

## 2020-04-17 ENCOUNTER — Inpatient Hospital Stay: Payer: Medicare HMO

## 2020-04-17 ENCOUNTER — Inpatient Hospital Stay: Payer: Medicare HMO | Admitting: Nurse Practitioner

## 2020-04-17 NOTE — ED Notes (Signed)
Pt changed and clean lined applied.

## 2020-04-17 NOTE — ED Notes (Signed)
Bed and brief were changed.

## 2020-04-17 NOTE — Clinical Social Work Note (Signed)
CSW spoke with patient's DSS case worker, Billey Co, regarding placement for patient and his hospice services. Per Colletta Maryland, the patient's daughter is submitting a LTC Medicaid application today and [facility] is reviewing the patient's FL2 so patient can receive hospice services at a facility. Patient is not currently in the timeframe for [facility].  CSW spoke with patient's hospice social worker, Lonn Georgia, with [facility]. Per Lonn Georgia, she is waiting to hear back from [facility] and she will notify CSW if patient has been accepted. TOC to follow.

## 2020-04-17 NOTE — ED Notes (Signed)
Pt was given breakfast tray 

## 2020-04-18 NOTE — ED Notes (Signed)
Bed and brief were changed
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
## 2020-04-19 ENCOUNTER — Inpatient Hospital Stay: Payer: Medicare HMO

## 2020-04-19 NOTE — Clinical Social Work Note (Signed)
CSW received call from [facility] with [facility]. Per Marylee [facility] declined patient over concerns regarding family dynamics. [facility] is awaiting corporate approval for a bed offer, but will not get approval before the weekend. Kayla to refer patient to [facility] as well. TOC to follow.

## 2020-04-19 NOTE — ED Notes (Signed)
Pts resting with eyes closed.

## 2020-04-20 MED ORDER — SIMETHICONE 40 MG/0.6ML PO SUSP
ORAL | Status: AC
  Filled 2020-04-20: qty 0.6

## 2020-04-20 MED ORDER — LORAZEPAM 2 MG/ML IJ SOLN
1.0000 mg | Freq: Once | INTRAMUSCULAR | Status: AC
  Administered 2020-04-20: 01:00:00 1 mg via INTRAVENOUS
  Filled 2020-04-20: qty 1

## 2020-04-20 NOTE — ED Notes (Signed)
Pt has attempted to get out of bed several times & has been assisted back into the bed. MD informed & medication requested.

## 2020-04-20 NOTE — ED Notes (Signed)
Family at bedside. Pt resting comfortably at this time.

## 2020-04-21 LAB — CBC WITH DIFFERENTIAL/PLATELET
Basophils Absolute: 0 10*3/uL (ref 0.0–0.1)
Basophils Relative: 0 %
Eosinophils Absolute: 0 10*3/uL (ref 0.0–0.5)
Eosinophils Relative: 0 %
HCT: 31.1 % — ABNORMAL LOW (ref 39.0–52.0)
Hemoglobin: 8.8 g/dL — ABNORMAL LOW (ref 13.0–17.0)
Lymphocytes Relative: 8 %
Lymphs Abs: 0.5 10*3/uL — ABNORMAL LOW (ref 0.7–4.0)
MCH: 28.9 pg (ref 26.0–34.0)
MCHC: 28.3 g/dL — ABNORMAL LOW (ref 30.0–36.0)
MCV: 102.3 fL — ABNORMAL HIGH (ref 80.0–100.0)
Metamyelocytes Relative: 1 %
Monocytes Absolute: 0.2 10*3/uL (ref 0.1–1.0)
Monocytes Relative: 3 %
Myelocytes: 1 %
Neutro Abs: 5.2 10*3/uL (ref 1.7–7.7)
Neutrophils Relative %: 87 %
Platelets: 217 10*3/uL (ref 150–400)
RBC: 3.04 MIL/uL — ABNORMAL LOW (ref 4.22–5.81)
RDW: 18.6 % — ABNORMAL HIGH (ref 11.5–15.5)
WBC: 6 10*3/uL (ref 4.0–10.5)
nRBC: 1.5 % — ABNORMAL HIGH (ref 0.0–0.2)

## 2020-04-21 LAB — BASIC METABOLIC PANEL
Anion gap: 11 (ref 5–15)
BUN: 94 mg/dL — ABNORMAL HIGH (ref 8–23)
CO2: 19 mmol/L — ABNORMAL LOW (ref 22–32)
Calcium: 8.4 mg/dL — ABNORMAL LOW (ref 8.9–10.3)
Chloride: 123 mmol/L — ABNORMAL HIGH (ref 98–111)
Creatinine, Ser: 1.68 mg/dL — ABNORMAL HIGH (ref 0.61–1.24)
GFR, Estimated: 43 mL/min — ABNORMAL LOW (ref >60)
Glucose, Bld: 81 mg/dL (ref 70–99)
Potassium: 4.2 mmol/L (ref 3.5–5.1)
Sodium: 153 mmol/L — ABNORMAL HIGH (ref 135–145)

## 2020-04-21 MED ORDER — LORAZEPAM 1 MG PO TABS: 1.0000 mg | ORAL_TABLET | ORAL | Status: DC | PRN

## 2020-04-21 MED ORDER — ACETAMINOPHEN 325 MG PO TABS: 650.0000 mg | ORAL_TABLET | Freq: Four times a day (QID) | ORAL | Status: DC | PRN

## 2020-04-21 MED ORDER — ACETAMINOPHEN 650 MG RE SUPP: 650.0000 mg | Freq: Four times a day (QID) | RECTAL | Status: DC | PRN

## 2020-04-21 MED ORDER — MORPHINE SULFATE (CONCENTRATE) 10 MG/0.5ML PO SOLN: 5.0000 mg | ORAL | Status: DC | PRN

## 2020-04-21 MED ORDER — LORAZEPAM 2 MG/ML PO CONC: 1.0000 mg | ORAL | Status: DC | PRN

## 2020-04-21 MED ORDER — LORAZEPAM 2 MG/ML IJ SOLN
1.0000 mg | Freq: Once | INTRAMUSCULAR | Status: AC
  Administered 2020-04-21: 14:00:00 1 mg via INTRAVENOUS
  Filled 2020-04-21: qty 1

## 2020-04-21 MED ORDER — LORAZEPAM 2 MG/ML IJ SOLN
1.0000 mg | INTRAMUSCULAR | Status: DC | PRN
  Administered 2020-04-21: 19:00:00 1 mg via INTRAVENOUS
  Filled 2020-04-21: qty 1

## 2020-04-21 NOTE — TOC Progression Note (Signed)
Transition of Care [facility]) - Progression Note    Patient Details  Name: Gabriel Poole MRN: [id number] Date of Birth: 01-01-1946  [facility] of [facility] CM/SW Contact  Shade Flood, LCSW Phone Number: 04/21/2020, 1:45 PM  Clinical Narrative:     TOC notified by RN and MD that pt is declining and is not verbally responsive. MD would like to confirm pt's DNR status. Spoke with Billey Co at [facility] who states that pt is his own decision maker and that if he is unable then it would be family decision. This LCSW did speak with staff at [facility] who stated that they do not have a DNR on file for pt.  Updated MD who stated he would attempt to reach family to discuss.   TOC will follow and assist as needed.    Barriers to Discharge: ED Unsafe disposition  Expected Discharge Plan and Services                                                 Social Determinants of Health (SDOH) Interventions    Readmission Risk Interventions No flowsheet data found.

## 2020-04-21 NOTE — ED Notes (Signed)
Pt no longer able to intake oral fluids. Multiple attempts and encouragement to pt to intake fluids. He does not understand nor have the strength. Holding all PO meds.MD made aware. Pt is comfortable at this time.

## 2020-04-21 NOTE — ED Provider Notes (Signed)
7:25 AM-patient be maintained in the ED setting because he is unable to go home and manage his ADLs.  Various discharge modalities have been consulted, and are actively being pursued by TOC.  Vital signs have been stable.  He is taking his prescribed medications.  Exam-malnourished chronically ill patient, currently sleeping comfortably.  Plan-nursing home placement for end-of-life care.  He has previously been referred to hospice, but it is not clear that he is actively being cared for by them.  Patient is unable to contribute to discussions of end-of-life care.  We will recheck labs and consider ongoing management; at that time.  Recent documentation from Dr. [name] his oncologist: He started first linechemo FOLFOX q2weeks on 12/07/19.I addedTrastuzumab (Kanjinti)q2weeks and Keytruda q6weekswith C3 on 01/24/20.Although he has had good response on 03/14/20 CT CAP, I stopped treatment after 03/22/20 due to ileus. Given his poor symptom management at home and recurrent hospital visits, I do not recommend further treatment as he is not able to tolerate.  -I discussed palliative care. I recommend hospice home care to better manage his symptoms and if not able to continue at home he can go to their residential facility. He is interested in hospice home care at this time.   Palliative care consult requested-7:42 AM  9:35 AM-morning labs have resulted, and are consistent with volume depletion, and the current clinical scenario.  Hemoglobin elevated consistent with hemoconcentration, sodium elevated, higher than prior.  Creatinine stable.  BUN slightly higher today..  1:05 PM-patient is unable to take oral medications.  His condition appears to be worsening.  I discussed the situation with social worker/TOC, who is attempting to contact hospice and DSS.  Patient's legal guardian is DSS.  Patient appears to need comfort care.  He would be a poor resuscitation candidate.  1:50 PM-I have discussed the  patient's current status with his daughter Arrie Aran, phone number [phone].  I also discussed the situation with his ex-wife, [name], phone number [phone].  They are both agreeable that the patient should not be resuscitated therefore he will be made a DNR/DNI.  The patient does not have an official healthcare power of attorney.  The patient is end-of-life, with incurable cancer, and this is an appropriate course of treatment.  I have informed the hospital social worker.  Palliative consult has been requested but not available today so has not been done.  Will proceed with comfort care.  Patient will not likely be taking oral medications.  I have recently treated him with some Ativan for agitation.  He may have some signs of narcotic withdrawal with agitation secondary to not being able to tolerate his oral medications, which include chronic narcotic treatment.   Daleen Bo, MD 04/21/20 [phone]

## 2020-04-22 DIAGNOSIS — R531 Weakness: Secondary | ICD-10-CM | POA: Diagnosis present

## 2020-04-22 DIAGNOSIS — Z87891 Personal history of nicotine dependence: Secondary | ICD-10-CM | POA: Diagnosis not present

## 2020-04-22 DIAGNOSIS — Z8249 Family history of ischemic heart disease and other diseases of the circulatory system: Secondary | ICD-10-CM | POA: Diagnosis not present

## 2020-04-22 DIAGNOSIS — Z880 Allergy status to penicillin: Secondary | ICD-10-CM | POA: Diagnosis not present

## 2020-04-22 DIAGNOSIS — Z7189 Other specified counseling: Secondary | ICD-10-CM

## 2020-04-22 DIAGNOSIS — Z79899 Other long term (current) drug therapy: Secondary | ICD-10-CM | POA: Diagnosis not present

## 2020-04-22 DIAGNOSIS — E860 Dehydration: Secondary | ICD-10-CM

## 2020-04-22 DIAGNOSIS — C189 Malignant neoplasm of colon, unspecified: Secondary | ICD-10-CM | POA: Diagnosis not present

## 2020-04-22 DIAGNOSIS — E785 Hyperlipidemia, unspecified: Secondary | ICD-10-CM | POA: Diagnosis present

## 2020-04-22 DIAGNOSIS — Z85028 Personal history of other malignant neoplasm of stomach: Secondary | ICD-10-CM | POA: Diagnosis not present

## 2020-04-22 DIAGNOSIS — Z66 Do not resuscitate: Secondary | ICD-10-CM

## 2020-04-22 DIAGNOSIS — Z20822 Contact with and (suspected) exposure to covid-19: Secondary | ICD-10-CM | POA: Diagnosis present

## 2020-04-22 DIAGNOSIS — F0391 Unspecified dementia with behavioral disturbance: Secondary | ICD-10-CM | POA: Diagnosis present

## 2020-04-22 DIAGNOSIS — R627 Adult failure to thrive: Secondary | ICD-10-CM

## 2020-04-22 DIAGNOSIS — R451 Restlessness and agitation: Secondary | ICD-10-CM | POA: Diagnosis not present

## 2020-04-22 DIAGNOSIS — Z806 Family history of leukemia: Secondary | ICD-10-CM | POA: Diagnosis not present

## 2020-04-22 DIAGNOSIS — Z515 Encounter for palliative care: Secondary | ICD-10-CM | POA: Diagnosis not present

## 2020-04-22 DIAGNOSIS — Z7901 Long term (current) use of anticoagulants: Secondary | ICD-10-CM | POA: Diagnosis not present

## 2020-04-22 DIAGNOSIS — I10 Essential (primary) hypertension: Secondary | ICD-10-CM | POA: Diagnosis present

## 2020-04-22 DIAGNOSIS — R64 Cachexia: Secondary | ICD-10-CM | POA: Diagnosis present

## 2020-04-22 LAB — RESP PANEL BY RT-PCR (FLU A&B, COVID) ARPGX2
Influenza A by PCR: NEGATIVE
Influenza B by PCR: NEGATIVE
SARS Coronavirus 2 by RT PCR: NEGATIVE

## 2020-04-22 MED ORDER — ONDANSETRON HCL 4 MG/2ML IJ SOLN: 4.0000 mg | Freq: Four times a day (QID) | INTRAMUSCULAR | Status: DC | PRN

## 2020-04-22 MED ORDER — BIOTENE DRY MOUTH MT LIQD: 15.0000 mL | OROMUCOSAL | Status: DC | PRN

## 2020-04-22 MED ORDER — HALOPERIDOL LACTATE 2 MG/ML PO CONC: 0.5000 mg | ORAL | Status: DC | PRN

## 2020-04-22 MED ORDER — GLYCOPYRROLATE 0.2 MG/ML IJ SOLN: 0.2000 mg | INTRAMUSCULAR | Status: DC | PRN

## 2020-04-22 MED ORDER — HALOPERIDOL LACTATE 5 MG/ML IJ SOLN: 0.5000 mg | INTRAMUSCULAR | Status: DC | PRN

## 2020-04-22 MED ORDER — POLYVINYL ALCOHOL 1.4 % OP SOLN
1.0000 [drp] | Freq: Four times a day (QID) | OPHTHALMIC | Status: DC | PRN
  Filled 2020-04-22: qty 15

## 2020-04-22 MED ORDER — MORPHINE SULFATE (PF) 2 MG/ML IV SOLN: 1.0000 mg | INTRAVENOUS | Status: DC | PRN

## 2020-04-22 MED ORDER — GLYCOPYRROLATE 1 MG PO TABS
1.0000 mg | ORAL_TABLET | ORAL | Status: DC | PRN
Start: 2020-04-22 — End: 2020-04-23
  Filled 2020-04-22: qty 1

## 2020-04-22 MED ORDER — HALOPERIDOL 0.5 MG PO TABS
0.5000 mg | ORAL_TABLET | ORAL | Status: DC | PRN
  Filled 2020-04-22: qty 1

## 2020-04-22 MED ORDER — ONDANSETRON 4 MG PO TBDP: 4.0000 mg | ORAL_TABLET | Freq: Four times a day (QID) | ORAL | Status: DC | PRN

## 2020-04-22 NOTE — ED Notes (Signed)
Update given to SW from [facility].

## 2020-04-22 NOTE — ED Notes (Signed)
DNR bracelet placed on pt.  

## 2020-04-22 NOTE — ED Notes (Signed)
Pt checked and repositioned. Turned to the rt side

## 2020-04-22 NOTE — Consult Note (Signed)
Consultation Note Date: 04/22/2020   Patient Name: Gabriel Poole  DOB: 02-08-1946  MRN: [id number]  Age / Sex: 74 y.o., male  PCP: Wannetta Sender, FNP Referring Physician: Default, Provider, MD  Reason for Consultation: Establishing goals of care and Terminal Care  HPI/Patient Profile: 74 y.o. male  with past medical history of dementia with behavioral disturbance, gastric cancer-not being treated currently enrolled with hospice, hypertension, admitted on 04/16/2020 with weakness, not eating or drinking. Since his admission his mental status declined, he is minimally arousable and is not taking in p.o. food, hydration, or medications. ED physician discussed and of care with DSS and his family, and decision was made to proceed with comfort measures only. There were questions regarding his disposition, but he now has plans for discharge to residential hospice home. ED physician requested assistance with comfort medications.  Clinical Assessment and Goals of Care: Evaluated patient at bedside. He appeared comfortable. He did not arouse to my voice or touch. Per nursing report he is not eating or drinking and wakes minimally.  Discussed with Dr. Alvino Chapel who requests assistance with comfort medications and orders.   Primary Decision Maker LEGAL GUARDIAN    SUMMARY OF RECOMMENDATIONS -Recommend discharge to residential Hospice -Will review orders and adjust them for comfort- patient current appears comfortable    Code Status/Advance Care Planning:  DNR  Prognosis:    < 2 weeks  Discharge Planning: Hospice facility  Primary Diagnoses: Present on Admission: **None**   I have reviewed the medical record, interviewed the patient and family, and examined the patient. The following aspects are pertinent.  Past Medical History:  Diagnosis Date  . Arthritis   . Cancer [facility]) dx oct 02-2017    stomach  . Gout   . Heart murmur   . Hyperlipidemia   . Hypertension   . Stomach cancer [facility])    Social History   Socioeconomic History  . Marital status: Divorced    Spouse name: Not on file  . Number of children: Not on file  . Years of education: Not on file  . Highest education level: Not on file  Occupational History  . Occupation: works at [employer]  . Smoking status: Former Smoker    Packs/day: 0.50    Years: 45.00    Pack years: 22.50    Types: Cigarettes, Cigars    Quit date: 04/2017    Years since quitting: 3.0  . Smokeless tobacco: Never Used  . Tobacco comment: used to smoke 1 pack a day   Vaping Use  . Vaping Use: Never used  Substance and Sexual Activity  . Alcohol use: Not Currently    Alcohol/week: 0.0 standard drinks    Comment: Brandy daily, stopped in Dec 2018  . Drug use: No  . Sexual activity: Yes  Other Topics Concern  . Not on file  Social History Narrative  . Not on file   Social Determinants of Health   Financial Resource Strain: Not on file  Food Insecurity:  Not on file  Transportation Needs: Not on file  Physical Activity: Not on file  Stress: Not on file  Social Connections: Not on file   Scheduled Meds: Continuous Infusions: PRN Meds:.acetaminophen **OR** acetaminophen, LORazepam **OR** [DISCONTINUED] LORazepam **OR** LORazepam, morphine CONCENTRATE **OR** morphine CONCENTRATE Medications Prior to Admission:  Prior to Admission medications   Medication Sig Start Date End Date Taking? Authorizing Provider  amLODipine (NORVASC) 10 MG tablet Take 1 tablet (10 mg total) by mouth daily. 12/04/15  Yes Claretta Fraise, MD  atorvastatin (LIPITOR) 40 MG tablet Take 1 tablet (40 mg total) by mouth daily at 6 PM. 05/08/15  Yes Dettinger, Fransisca Kaufmann, MD  Cholecalciferol (VITAMIN D3) 5000 units CAPS Take 5,000 Units by mouth daily.   Yes [provider]  famotidine (PEPCID) 20 MG tablet Take 1 tablet (20 mg total) by  mouth 2 (two) times daily. 03/30/20 05/29/20 Yes Rai, Ripudeep K, MD  finasteride (PROSCAR) 5 MG tablet Take 5 mg by mouth daily. 10/24/19  Yes [provider]  HYDROcodone-acetaminophen (NORCO/VICODIN) 5-325 MG tablet Take 1 tablet by mouth every 6 (six) hours as needed for moderate pain or severe pain. 04/03/20  Yes Truitt Merle, MD  lidocaine-prilocaine (EMLA) cream Apply to affected area once 11/24/19  Yes Truitt Merle, MD  megestrol (MEGACE ES) 625 MG/5ML suspension Take 5 mLs (625 mg total) by mouth daily. 01/24/20  Yes Alla Feeling, NP  morphine (MS CONTIN) 15 MG 12 hr tablet Take 1 tablet (15 mg total) by mouth every 12 (twelve) hours. 04/09/20  Yes Truitt Merle, MD  ondansetron (ZOFRAN) 8 MG tablet Take 1 tablet (8 mg total) by mouth 2 (two) times daily as needed for refractory nausea / vomiting. Start on day 3 after chemotherapy. 03/06/20  Yes Truitt Merle, MD  pantoprazole (PROTONIX) 40 MG tablet Take 1 tablet (40 mg total) by mouth daily. 03/06/20  Yes Truitt Merle, MD  polyethylene glycol (MIRALAX / GLYCOLAX) 17 g packet Take 17 g by mouth daily. Also available OTC 03/30/20  Yes Rai, Ripudeep K, MD  potassium chloride SA (K-DUR,KLOR-CON) 20 MEQ tablet Take 1 tablet (20 mEq total) by mouth 2 (two) times daily. 08/18/17  Yes Truitt Merle, MD  prochlorperazine (COMPAZINE) 10 MG tablet Take 1 tablet (10 mg total) by mouth every 6 (six) hours as needed (Nausea or vomiting). 03/30/20  Yes Rai, Ripudeep K, MD  senna-docusate (SENOKOT-S) 8.6-50 MG tablet Take 1 tablet by mouth 2 (two) times daily. Also available OTC 03/30/20  Yes Rai, Ripudeep K, MD  sucralfate (CARAFATE) 1 g tablet Take 1 tablet (1 g total) by mouth 4 (four) times daily -  with meals and at bedtime. 03/30/20 05/29/20 Yes Rai, Ripudeep K, MD  tadalafil (CIALIS) 5 MG tablet Take 5 mg by mouth daily. 09/28/19  Yes [provider]  tamsulosin (FLOMAX) 0.4 MG CAPS capsule Take 0.4 mg by mouth daily. 11/16/19  Yes [provider]   triamterene-hydrochlorothiazide (MAXZIDE-25) 37.5-25 MG tablet Take 1 tablet by mouth daily.   Yes [provider]  Budesonide ER 9 MG CP24 Take 9 mg by mouth daily. 03/23/20   Truitt Merle, MD   Allergies  Allergen Reactions  . Penicillins Itching and Other (See Comments)     patient had a PCN reaction causing immediate rash, facial/tongue/throat swelling, SOB or lightheadedness with hypotension: Unknown Has patient had a PCN reaction causing severe rash involving mucus membranes or skin necrosis: Unknown Has patient had a PCN reaction that required hospitalization: Unknown  Has patient had a PCN reaction occurring within the last 10 years: No If all of the above answers are "NO", then may proceed with Cephalosporin use. Teenager*    Review of Systems  Unable to perform ROS: Mental status change    Physical Exam Vitals and nursing note reviewed.  Constitutional:      Comments: Cachetic, frail  Pulmonary:     Effort: Pulmonary effort is normal.  Neurological:     Comments: Minimally responsive     Vital Signs: BP 91/65   Pulse 80   Temp 97.7 F (36.5 C) (Axillary)   Resp 18   Ht 5\' 10"  (1.778 m)   Wt 49.9 kg   SpO2 100%   BMI 15.78 kg/m  Pain Scale: 0-10   Pain Score: Asleep   SpO2: SpO2: 100 % O2 Device:SpO2: 100 % O2 Flow Rate: .O2 Flow Rate (L/min): 2 L/min  IO: Intake/output summary: No intake or output data in the 24 hours ending 04/22/20 1216  LBM:   Baseline Weight: Weight: 49.9 kg Most recent weight: Weight: 49.9 kg     Palliative Assessment/Data: PPS: 10%     Thank you for this consult. Palliative medicine will continue to follow and assist as needed.   Time In: 1121 Time Out: 1223 Time Total: 62 mins Greater than 50%  of this time was spent counseling and coordinating care related to the above assessment and plan.  Signed by: Mariana Kaufman, AGNP-C Palliative Medicine    Please contact Palliative Medicine Team phone at [phone] for  questions and concerns.  For individual provider: See Shea Evans

## 2020-04-22 NOTE — TOC Transition Note (Signed)
Transition of Care [facility]) - CM/SW Discharge Note   Patient Details  Name: Gabriel Poole MRN: [id number] Date of Birth: 1945/10/17  Transition [facility]) CM/SW Contact:  Iona Beard, [place] Phone Number: 04/22/2020, 9:49 PM   Clinical Narrative:    Pt to discharge to the [facility]. EMS has been scheduled. CSW provided RN with number to call for report. CSW printed med necessity form to the floor. CSW updated pts daughter [name] of transfer. CSW faxed discharge summary and negative COVID results to [facility]. TOC signing off.    Final next level of care: [place] Barriers to Discharge: Barriers Resolved   Patient Goals and CMS Choice Patient states their goals for this hospitalization and ongoing recovery are:: Go to [facility]   Choice offered to / list presented to : NA  Discharge Placement                Patient to be transferred to facility by: [facility] Name of family member notified: [name] Patient and family notified of of transfer: 04/22/20  Discharge Plan and Services                DME Arranged: N/A DME Agency: NA       HH Arranged: NA HH Agency: NA        Social Determinants of Health (SDOH) Interventions     Readmission Risk Interventions No flowsheet data found.

## 2020-04-22 NOTE — Progress Notes (Signed)
Nutrition Brief Note  Chart reviewed.History of dementia, gastric cancer.  RD drawn to patient due to malnutrition screen. Patient unable to eat and drink. Presented to ED 5 days ago.Hopsice placement being pursued.  No further nutrition interventions warranted at this time.   Colman Cater MS,RD,CSG,LDN Pager: Shea Evans

## 2020-04-22 NOTE — ED Provider Notes (Addendum)
°  Physical Exam  BP 104/69    Pulse 68    Temp 97.7 F (36.5 C) (Axillary)    Resp 18    Ht 5\' 10"  (1.778 m)    Wt 49.9 kg    SpO2 99%    BMI 15.78 kg/m   Physical Exam  ED Course/Procedures     Procedures  MDM  Patient chronically ill-appearing.  Laying in bed sleeping comfortably.  Pending hospice placement.  Reportedly not taking orals either food or medicine. Patient is a DNR. Since still pending placement will have palliative medicine consult today.  Patient has been accepted at the hospice house.  Apparently another day or 2 before possible admission.  Palliative medicine is seen patient and put in orders       Davonna Belling, MD 04/22/20 2162    Davonna Belling, MD 04/22/20 [phone]

## 2020-04-22 NOTE — Discharge Summary (Signed)
This patient was admitted earlier few hours ago after staying in the emergency room for 5 days waiting for inpatient hospice availability.  Fortunately, hospice bed became available.  See H&P done earlier.  Patient is a stable to transfer to inpatient hospice with medical transfer.  Will medicate patient for comfort before transferring.  Hospice facility to formulate further plan of care for end-of-life care.

## 2020-04-22 NOTE — ED Notes (Signed)
Pt resting in bed  No distress noted

## 2020-04-22 NOTE — H&P (Signed)
History and Physical    Gabriel Poole IHK:[id number] DOB: 11-25-1945 DOA: 04/16/2020  PCP: Wannetta Sender, FNP  Patient coming from: Home  I have personally briefly reviewed patient's old medical records available.   Chief Complaint: Unable to eat or drink.  HPI: Gabriel Poole is a 74 y.o. male with medical history significant of Dementia with behavioral disturbance, gastric cancer currently enrolled in home hospice, hypertension who arrived to emergency room 5 days ago with weakness, not eating or drinking.  Apparently he was living with his girlfriend and his ex-wife went to check on him who found him minimally arousable, not eating, dehydrated and not taking any medications.  He had been staying in the emergency room and provided with supportive care with plan to go back home, however adequate resources could not be arranged and patient now nearing his end-of-life so hospice placement pursued.  Seen by palliative care case management in the ER, unable to have open bed available at hospice place so will need hospitalizations to provide end-of-life care. When I went to interview the patient, he is totally obtunded.  His sister is at the bedside who was able to give me some history.  Family is aware that patient is at end-of-life. ED Course: Comfortably sleeping, unarousable to minimal stimuli.  On full comfort care measures while I examined the patient.  Review of Systems: all systems are reviewed and pertinent positive as per HPI otherwise rest are negative.    Past Medical History:  Diagnosis Date  . Arthritis   . Cancer [facility]) dx oct 02-2017   stomach  . Gout   . Heart murmur   . Hyperlipidemia   . Hypertension   . Stomach cancer [facility])     Past Surgical History:  Procedure Laterality Date  . BIOPSY  11/02/2019   Procedure: BIOPSY;  Surgeon: Rogene Houston, MD;  [facility];  Service: Endoscopy;;  gastric  . COLONOSCOPY    . COLONOSCOPY N/A  11/02/2019   Procedure: COLONOSCOPY;  Surgeon: Rogene Houston, MD;  [facility];  Service: Endoscopy;  Laterality: N/A;  100  . ESOPHAGOGASTRODUODENOSCOPY N/A 02/17/2017   Procedure: ESOPHAGOGASTRODUODENOSCOPY (EGD);  Surgeon: Rogene Houston, MD;  [facility];  Service: Endoscopy;  Laterality: N/A;  3:00  . ESOPHAGOGASTRODUODENOSCOPY N/A 11/02/2019   Procedure: ESOPHAGOGASTRODUODENOSCOPY (EGD);  Surgeon: Rogene Houston, MD;  [facility];  Service: Endoscopy;  Laterality: N/A;  . ESOPHAGOGASTRODUODENOSCOPY (EGD) WITH PROPOFOL N/A 03/29/2020   Procedure: ESOPHAGOGASTRODUODENOSCOPY (EGD) WITH PROPOFOL;  Surgeon: Mauri Pole, MD;  [facility];  Service: Endoscopy;  Laterality: N/A;  . EUS N/A 03/11/2017   Procedure: UPPER ENDOSCOPIC ULTRASOUND (EUS) LINEAR;  Surgeon: Milus Banister, MD;  [facility];  Service: Endoscopy;  Laterality: N/A;  . EUS N/A 03/11/2017   Procedure: UPPER ENDOSCOPIC ULTRASOUND (EUS) RADIAL;  Surgeon: Milus Banister, MD;  [facility];  Service: Endoscopy;  Laterality: N/A;  . FINGER SURGERY     Lt middle   . FOREIGN BODY REMOVAL  03/29/2020   Procedure: FOREIGN BODY REMOVAL;  Surgeon: Mauri Pole, MD;  [facility];  Service: Endoscopy;;  . GASTRECTOMY N/A 04/22/2017   Procedure: PARTIAL GASTRECTOMY;  Surgeon: Stark Klein, MD;  Location: [place];  Service: General;  Laterality: N/A;  . GASTROJEJUNOSTOMY N/A 04/22/2017   Procedure: JEJUNAL FEEDING TUBE PLACEMENT;  Surgeon: Stark Klein, MD;  Location: [place];  Service: General;  Laterality:  N/A;  . HYDROCELE EXCISION  02/12/2011   Procedure: HYDROCELECTOMY ADULT;  Surgeon: Marissa Nestle;  Location: [facility];  Service: Urology;  Laterality: Left;  . LAPAROSCOPY N/A 04/22/2017   Procedure: LAPAROSCOPY DIAGNOSTIC ERAS PATHWAY;  Surgeon: Stark Klein, MD;  Location: [place];  Service: General;  Laterality: N/A;  EPIDURAL  .  PORTACATH PLACEMENT N/A 05/28/2017   Procedure: INSERTION PORT-A-CATH ERAS PATHWAY;  Surgeon: Stark Klein, MD;  Location: [place];  Service: General;  Laterality: N/A;   Social history:  reports that he quit smoking about 3 years ago. His smoking use included cigarettes and cigars. He has a 22.50 pack-year smoking history. He has never used smokeless tobacco. He reports previous alcohol use. He reports that he does not use drugs.  Allergies  Allergen Reactions  . Penicillins Itching and Other (See Comments)     patient had a PCN reaction causing immediate rash, facial/tongue/throat swelling, SOB or lightheadedness with hypotension: Unknown Has patient had a PCN reaction causing severe rash involving mucus membranes or skin necrosis: Unknown Has patient had a PCN reaction that required hospitalization: Unknown Has patient had a PCN reaction occurring within the last 10 years: No If all of the above answers are "NO", then may proceed with Cephalosporin use. Teenager*     Family History  Problem Relation Age of Onset  . Hypertension Mother   . Heart attack Father   . Cancer Maternal Aunt        leukemia  . Cancer Maternal Aunt        leukemia  . Hypotension Neg Hx   . Anesthesia problems Neg Hx   . Malignant hyperthermia Neg Hx   . Pseudochol deficiency Neg Hx      Prior to Admission medications   Medication Sig Start Date End Date Taking? Authorizing Provider  amLODipine (NORVASC) 10 MG tablet Take 1 tablet (10 mg total) by mouth daily. 12/04/15  Yes Claretta Fraise, MD  atorvastatin (LIPITOR) 40 MG tablet Take 1 tablet (40 mg total) by mouth daily at 6 PM. 05/08/15  Yes Dettinger, Fransisca Kaufmann, MD  Cholecalciferol (VITAMIN D3) 5000 units CAPS Take 5,000 Units by mouth daily.   Yes [provider]  famotidine (PEPCID) 20 MG tablet Take 1 tablet (20 mg total) by mouth 2 (two) times daily. 03/30/20 05/29/20 Yes Rai, Ripudeep K, MD  finasteride (PROSCAR) 5 MG  tablet Take 5 mg by mouth daily. 10/24/19  Yes [provider]  HYDROcodone-acetaminophen (NORCO/VICODIN) 5-325 MG tablet Take 1 tablet by mouth every 6 (six) hours as needed for moderate pain or severe pain. 04/03/20  Yes Truitt Merle, MD  lidocaine-prilocaine (EMLA) cream Apply to affected area once 11/24/19  Yes Truitt Merle, MD  megestrol (MEGACE ES) 625 MG/5ML suspension Take 5 mLs (625 mg total) by mouth daily. 01/24/20  Yes Alla Feeling, NP  morphine (MS CONTIN) 15 MG 12 hr tablet Take 1 tablet (15 mg total) by mouth every 12 (twelve) hours. 04/09/20  Yes Truitt Merle, MD  ondansetron (ZOFRAN) 8 MG tablet Take 1 tablet (8 mg total) by mouth 2 (two) times daily as needed for refractory nausea / vomiting. Start on day 3 after chemotherapy. 03/06/20  Yes Truitt Merle, MD  pantoprazole (PROTONIX) 40 MG tablet Take 1 tablet (40 mg total) by mouth daily. 03/06/20  Yes Truitt Merle, MD  polyethylene glycol (MIRALAX / GLYCOLAX) 17 g packet Take 17 g by mouth daily. Also available OTC 03/30/20  Yes Rai, Ripudeep  K, MD  potassium chloride SA (K-DUR,KLOR-CON) 20 MEQ tablet Take 1 tablet (20 mEq total) by mouth 2 (two) times daily. 08/18/17  Yes Truitt Merle, MD  prochlorperazine (COMPAZINE) 10 MG tablet Take 1 tablet (10 mg total) by mouth every 6 (six) hours as needed (Nausea or vomiting). 03/30/20  Yes Rai, Ripudeep K, MD  senna-docusate (SENOKOT-S) 8.6-50 MG tablet Take 1 tablet by mouth 2 (two) times daily. Also available OTC 03/30/20  Yes Rai, Ripudeep K, MD  sucralfate (CARAFATE) 1 g tablet Take 1 tablet (1 g total) by mouth 4 (four) times daily -  with meals and at bedtime. 03/30/20 05/29/20 Yes Rai, Ripudeep K, MD  tadalafil (CIALIS) 5 MG tablet Take 5 mg by mouth daily. 09/28/19  Yes [provider]  tamsulosin (FLOMAX) 0.4 MG CAPS capsule Take 0.4 mg by mouth daily. 11/16/19  Yes [provider]  triamterene-hydrochlorothiazide (MAXZIDE-25) 37.5-25 MG tablet Take 1 tablet by mouth daily.    Yes [provider]  Budesonide ER 9 MG CP24 Take 9 mg by mouth daily. 03/23/20   Truitt Merle, MD    Physical Exam: Vitals:   04/22/20 1230 04/22/20 1300 04/22/20 1330 04/22/20 1400  BP: (!) 89/58 (!) 75/38 (!) 86/71 100/74  Pulse: 65 63 67 65  Resp: 20 16 20 16   Temp:      TempSrc:      SpO2: 100% 100% 100% 100%  Weight:      Height:        Constitutional: Chronically sick looking, cachectic gentleman who is comfortably sleeping after medication for comfort. Vitals:   04/22/20 1230 04/22/20 1300 04/22/20 1330 04/22/20 1400  BP: (!) 89/58 (!) 75/38 (!) 86/71 100/74  Pulse: 65 63 67 65  Resp: 20 16 20 16   Temp:      TempSrc:      SpO2: 100% 100% 100% 100%  Weight:      Height:       Cachectic, frail, minimally responsive.  On 2 L oxygen. Bilateral conducted airway sounds. Does not follow commands.    Labs on Admission: I have personally reviewed following labs and imaging studies  CBC: Recent Labs  Lab 04/16/20 1822 04/21/20 0854  WBC 8.1 6.0  NEUTROABS 7.4 5.2  HGB 7.5* 8.8*  HCT 26.0* 31.1*  MCV 101.2* 102.3*  PLT 221 510   Basic Metabolic Panel: Recent Labs  Lab 04/16/20 1822 04/21/20 0854  NA 150* 153*  K 3.5 4.2  CL 123* 123*  CO2 19* 19*  GLUCOSE 132* 81  BUN 91* 94*  CREATININE 1.63* 1.68*  CALCIUM 8.9 8.4*   GFR: Estimated Creatinine Clearance: 27.6 mL/min (A) (by C-G formula based on SCr of 1.68 mg/dL (H)). Liver Function Tests: Recent Labs  Lab 04/16/20 1822  AST 30  ALT 24  ALKPHOS 63  BILITOT 1.6*  PROT 5.2*  ALBUMIN 2.7*   No results for input(s): LIPASE, AMYLASE in the last 168 hours. No results for input(s): AMMONIA in the last 168 hours. Coagulation Profile: No results for input(s): INR, PROTIME in the last 168 hours. Cardiac Enzymes: No results for input(s): CKTOTAL, CKMB, CKMBINDEX, TROPONINI in the last 168 hours. BNP (last 3 results) No results for input(s): PROBNP in the last 8760 hours. HbA1C: No results  for input(s): HGBA1C in the last 72 hours. CBG: No results for input(s): GLUCAP in the last 168 hours. Lipid Profile: No results for input(s): CHOL, HDL, LDLCALC, TRIG, CHOLHDL, LDLDIRECT in the last 72 hours. Thyroid  Function Tests: No results for input(s): TSH, T4TOTAL, FREET4, T3FREE, THYROIDAB in the last 72 hours. Anemia Panel: No results for input(s): VITAMINB12, FOLATE, FERRITIN, TIBC, IRON, RETICCTPCT in the last 72 hours. Urine analysis:    Component Value Date/Time   COLORURINE AMBER (A) 04/16/2020 2000   APPEARANCEUR CLEAR 04/16/2020 2000   LABSPEC 1.021 04/16/2020 2000   PHURINE 5.0 04/16/2020 2000   GLUCOSEU NEGATIVE 04/16/2020 2000   HGBUR SMALL (A) 04/16/2020 2000   BILIRUBINUR NEGATIVE 04/16/2020 2000   BILIRUBINUR color interference 03/07/2015 1309   KETONESUR NEGATIVE 04/16/2020 2000   PROTEINUR 30 (A) 04/16/2020 2000   UROBILINOGEN 0.2 12/17/2010 1744   NITRITE NEGATIVE 04/16/2020 2000   LEUKOCYTESUR SMALL (A) 04/16/2020 2000    Radiological Exams on Admission: No results found.  EKG: Independently reviewed.  Not done for comfort care.  Assessment/Plan Active Problems:   End of life care   Adenocarcinoma of the stomach, cancer cachexia, failure to thrive, metabolic encephalopathy  1.  End-of-life care: Agree with admission to the hospital to provide optimizing end-of-life care. MedSurg unit. Comfort feeding if able [place] visitor policy Symptom control with benzodiazepines, opiates, antisecretory and anticholinergic agents. Transfer to inpatient hospice whenever bed is available. No lab draws, no vital signs. RN to pronounce death if happens in the hospital.  DVT prophylaxis: Not indicated, comfort care Code Status: DNR/comfort care Family Communication: Sister at the bedside Disposition Plan: Inpatient hospice whenever bed available Consults called: Palliative medicine Admission status: Inpatient, MedSurg   Barb Merino MD [facility] Pager [phone]

## 2020-04-22 NOTE — ED Notes (Signed)
Fall bracelet placed on pt.

## 2020-04-22 NOTE — TOC Progression Note (Signed)
Transition of Care [facility]) - Progression Note   Patient Details  Name: Gabriel Poole MRN: [id number] Date of Birth: 1945/07/30  Transition of Care [facility]) CM/SW [place], LCSW Phone Number: 04/22/2020, 12:36 PM  Clinical Narrative: Due to patient's decline, CSW reached out to social worker, [place], with [facility] to have patient reassessed for [facility]. [facility] to send out RN to reassess patient. CSW received return call from [facility] stating patient now meets criteria for residential hospice and daughter has now made the patient a DNR. Hospice bed should be available in a few days. TOC awaiting residential hospice bed.   Barriers to Discharge: Hospice Bed not available  Readmission Risk Interventions No flowsheet data found.

## 2020-04-22 NOTE — TOC Progression Note (Signed)
Transition of Care [facility]) - Progression Note   Patient Details  Name: Gabriel Poole MRN: [id number] Date of Birth: Oct 09, 1945  Transition of Care [facility]) CM/SW [place], LCSW Phone Number: 04/22/2020, 4:28 PM  Clinical Narrative: CSW notified by Lonn [place] with hospice that a bed is now available. Hospitalist updated. Awaiting negative COVID test and for hospice to call TOC once the patient's room is cleaned and ready.   Barriers to Discharge: Hospice Bed not available  Readmission Risk Interventions No flowsheet data found.

## 2020-04-22 NOTE — ED Notes (Signed)
Dr. Sloan Leiter in to assess pt, family member at bedside.

## 2020-04-23 NOTE — Progress Notes (Addendum)
Spoke with Education officer, museum and she gave number to give report to [facility]. Report was given @ 2158 to [place] at [place]. [facility] in to transport patient to [facility] at this time. IV removed. Belongings left with patient.

## 2020-05-11 DEATH — deceased

## 2021-10-26 IMAGING — CT CT CHEST W/ CM
2 of 5 series · 12 of 36 positions shown, 15 images · IV contrast (APPLIED)
Comparison: CT AP 04/04/2019 and CT CAP 05/09/2018

CLINICAL DATA: Gastric cancer restaging.

EXAM:
CT CHEST, ABDOMEN, AND PELVIS WITH CONTRAST
TECHNIQUE: Multidetector CT imaging of the chest, abdomen and pelvis was
performed following the standard protocol during bolus
administration of intravenous contrast.
CONTRAST:  100mL OMNIPAQUE IOHEXOL 300 MG/ML  SOLN

[Series 3: cap with · axial · 0.68mm/px · z∈[-471,+44]mm · 9 of 129 slices shown, 12 images]
[im 13/129  mediastinal]
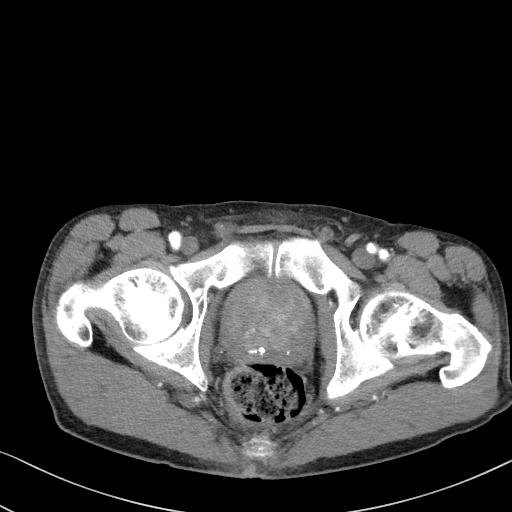
[im 13/129  lung]
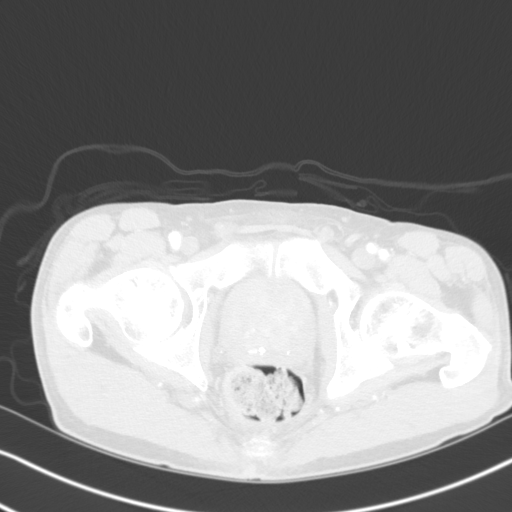
[im 26/129  lung]
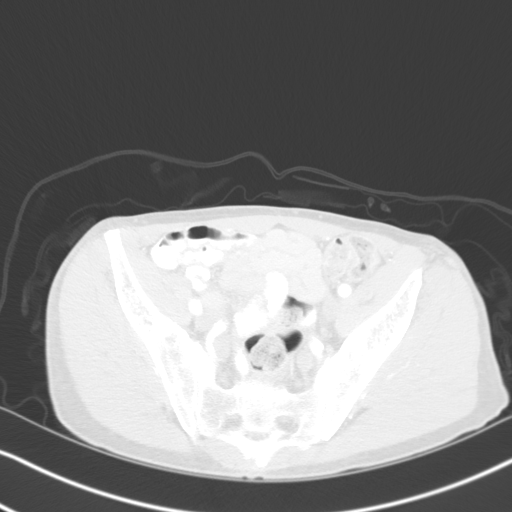
[im 39/129  lung]
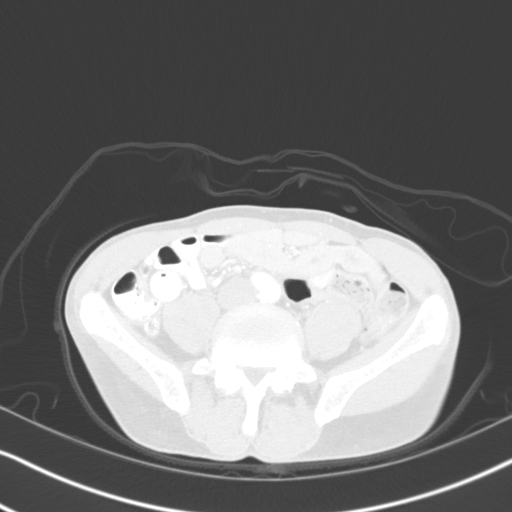
[im 52/129  lung]
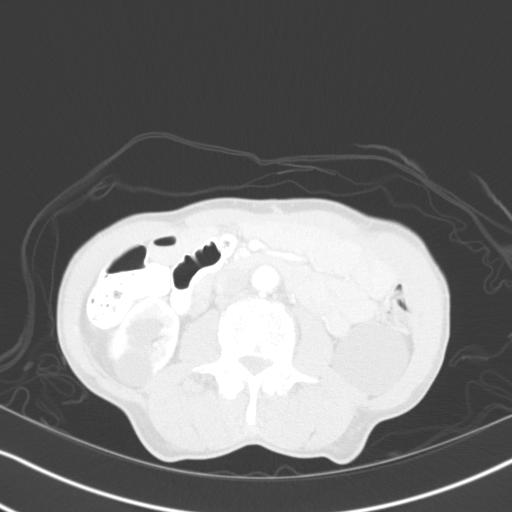
[im 65/129  mediastinal]
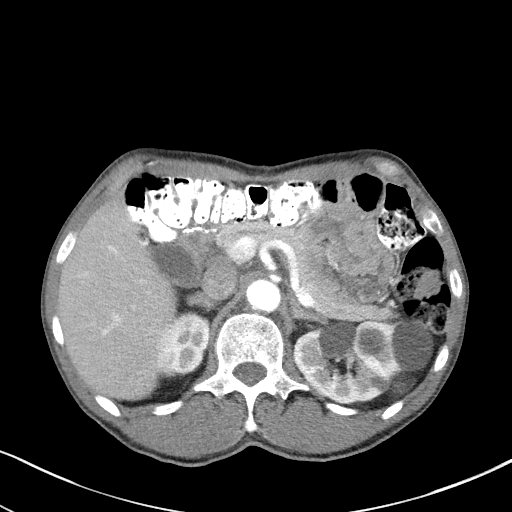
[im 65/129  lung]
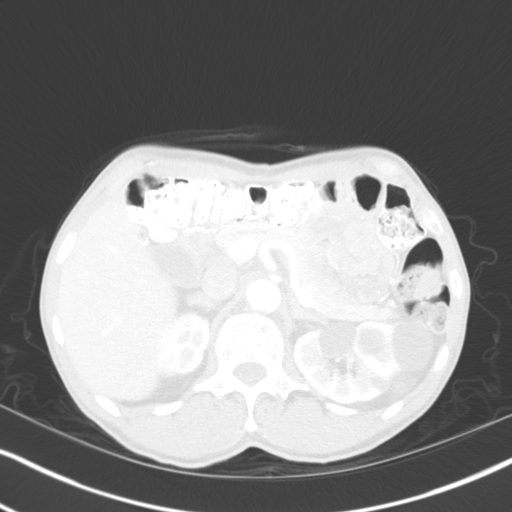
[im 77/129  lung]
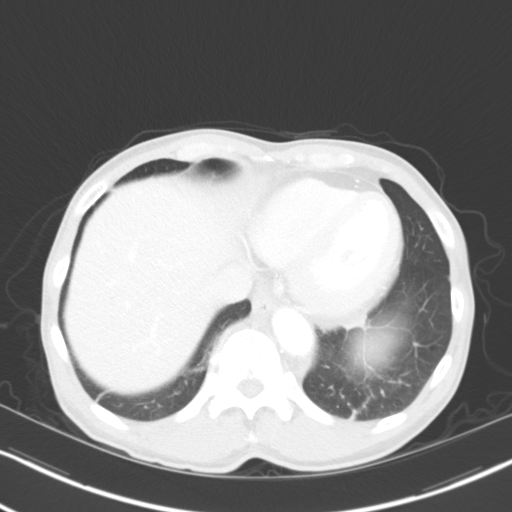
[im 90/129  lung]
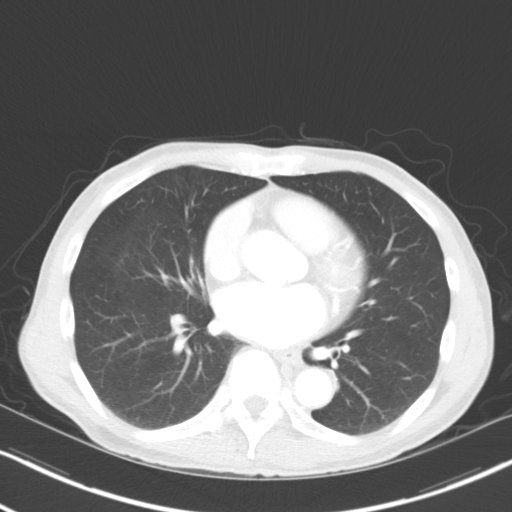
[im 103/129  lung]
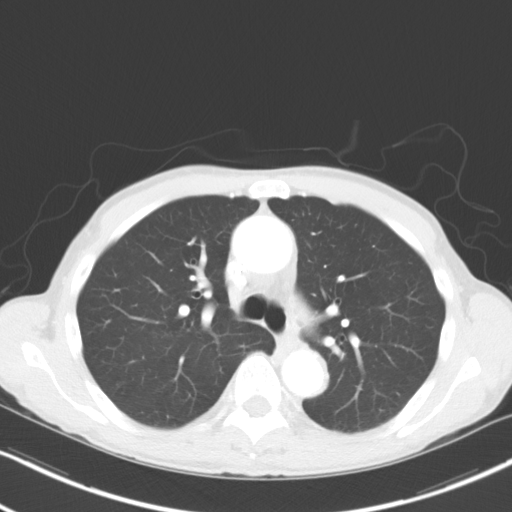
[im 116/129  mediastinal]
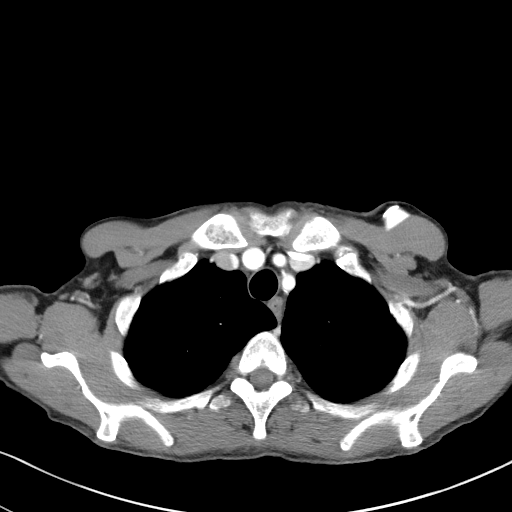
[im 116/129  lung]
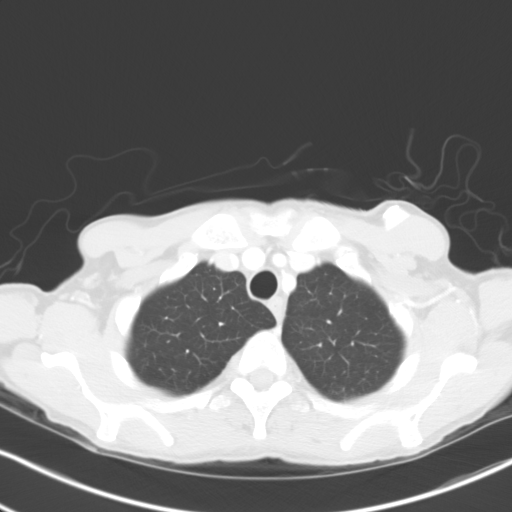

[Series 5: coronals · coronal · 0.71mm/px · 3 of 137 slices shown]
[im 28/137  lung]
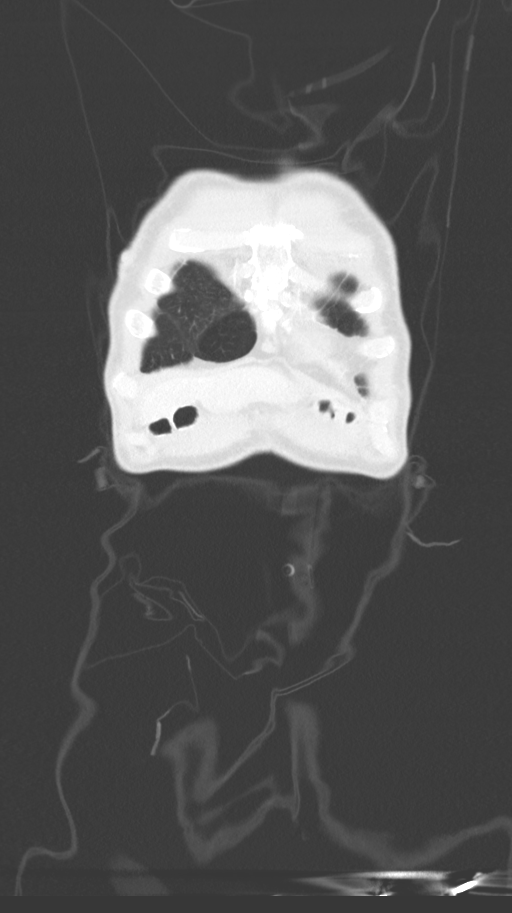
[im 55/137  lung]
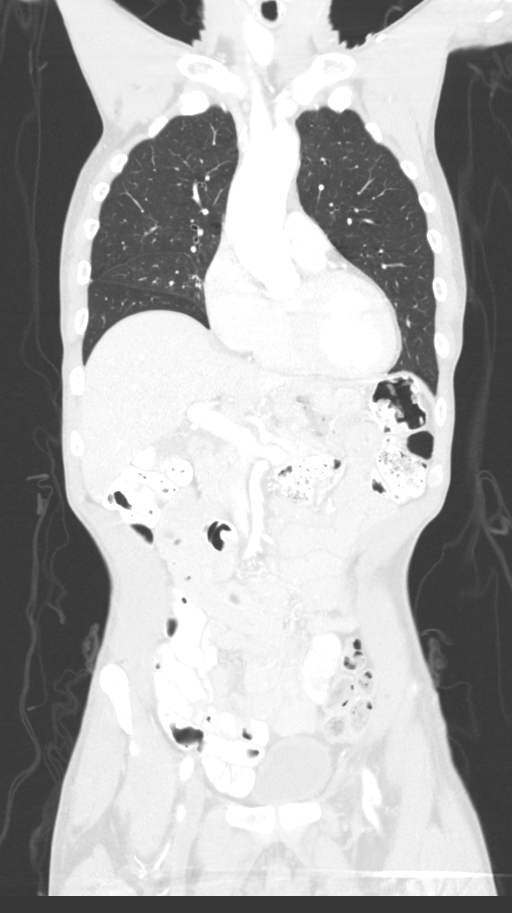
[im 82/137  lung]
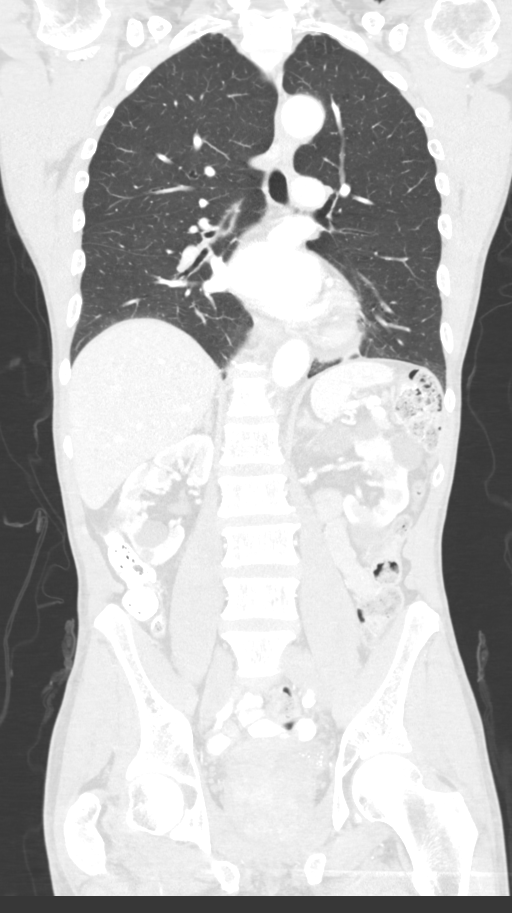

[12 of 36 positions shown; findings below may reference images not displayed]

FINDINGS: CT CHEST FINDINGS

Cardiovascular: Normal heart size. No pericardial effusion. Stable
mild aneurysmal dilatation of the ascending thoracic aorta which
measures 4 cm, image 34/3.

Mediastinum/Nodes: Normal appearance of the thyroid gland. The
trachea appears patent and is midline. Normal appearance of the
esophagus. No enlarged mediastinal, or hilar adenopathy.

Lungs/Pleura: No pleural effusion. Small subpleural nodule within
the lingula is unchanged measuring 3 mm, image 128/7. 5 mm right
lower lobe lung nodule is unchanged, image 92/7. Posteromedial right
lower lobe lung nodule is unchanged measuring 4 mm, image 84/7. No
new suspicious lung nodules.

Musculoskeletal: No chest wall mass or suspicious bone lesions
identified.

CT ABDOMEN PELVIS FINDINGS

Hepatobiliary: No focal liver abnormality is seen. No gallstones,
gallbladder wall thickening, or biliary dilatation.

Pancreas: Unremarkable. No pancreatic ductal dilatation or
surrounding inflammatory changes.

Spleen: Normal in size without focal abnormality.

Adrenals/Urinary Tract: The adrenal glands appear normal. Bilateral
kidney cysts are identified. The largest arises from the inferior
pole of left kidney measuring 4.6 cm. No hydronephrosis identified
bilaterally. There are multiple tiny stones within the bladder
measuring up to 2-3 mm.

Stomach/Bowel: Status post partial gastrectomy with
gastrojejunostomy. No dilated loops of small or large bowel. No
bowel wall thickening, inflammation or distension.

Vascular/Lymphatic: Aortic atherosclerosis. No aneurysm. No
abdominopelvic adenopathy.

Reproductive: Marked enlargement of the prostate gland measures
x 6.3 by 7.4 cm.

Other: No ascites. No focal fluid collections. No peritoneal
nodularity

Musculoskeletal: No suspicious bone lesions.
IMPRESSION: 1. No acute findings within the chest, abdomen or pelvis. No
specific findings identified to suggest residual or recurrent tumor
or metastatic disease.
2. Small nonspecific pulmonary nodules are unchanged.
3. Marked enlargement of the prostate gland.
4. Multiple tiny bladder stones.
5. Aortic atherosclerosis.

Aortic Atherosclerosis ([id number]-FCT.T).

## 2021-10-27 ENCOUNTER — Encounter: Payer: Self-pay | Admitting: Hematology

## 2022-05-25 IMAGING — CT CT CHEST-ABD-PELV W/ CM
2 of 5 series · 13 of 36 positions shown, 15 images · IV contrast (OMNIPAQUE)
Comparison: PET-CT 12/05/2019

CLINICAL DATA: Restaging gastric cancer. Weight loss and loss of
appetite.

EXAM:
CT CHEST, ABDOMEN, AND PELVIS WITH CONTRAST
TECHNIQUE: Multidetector CT imaging of the chest, abdomen and pelvis was
performed following the standard protocol during bolus
administration of intravenous contrast.
CONTRAST:  100mL OMNIPAQUE IOHEXOL 300 MG/ML  SOLN

[Series 2: cap with · axial · 0.70mm/px · z∈[+1144,+1689]mm · 10 of 135 slices shown, 12 images]
[im 13/135  mediastinal]
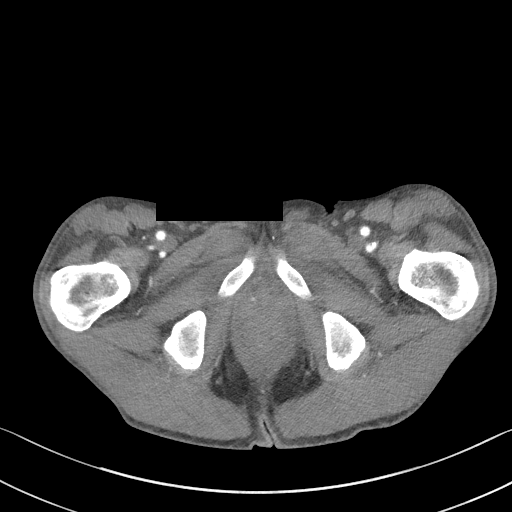
[im 13/135  bone]
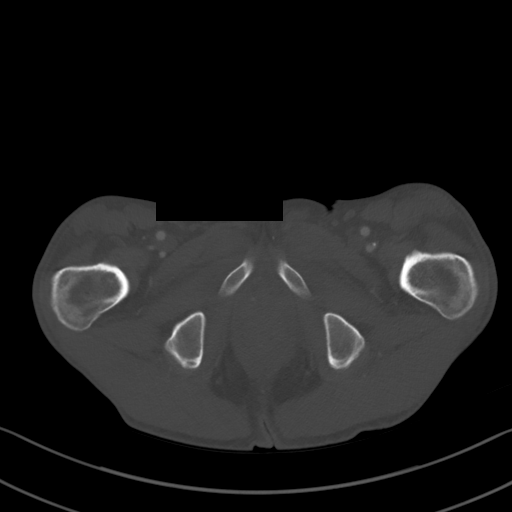
[im 25/135  mediastinal]
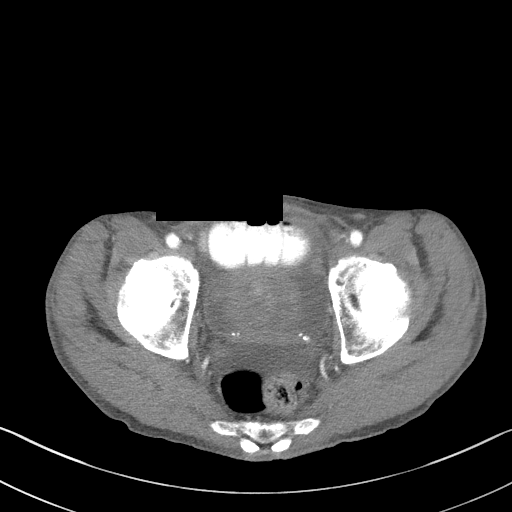
[im 37/135  mediastinal]
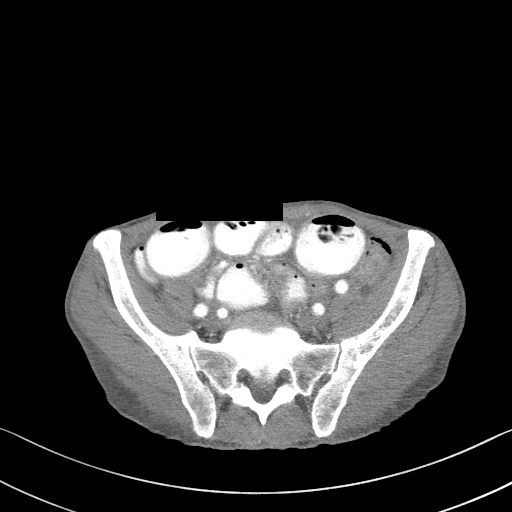
[im 49/135  mediastinal]
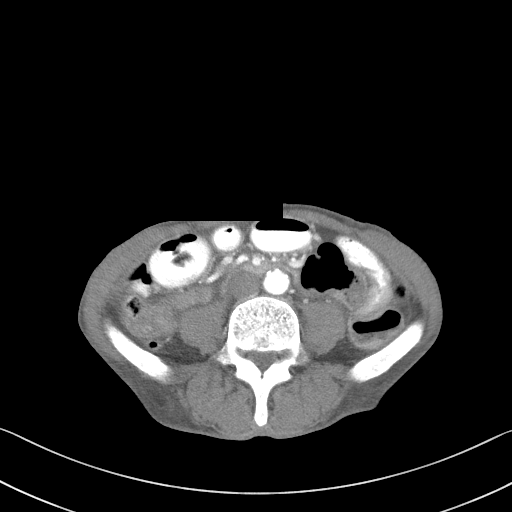
[im 61/135  mediastinal]
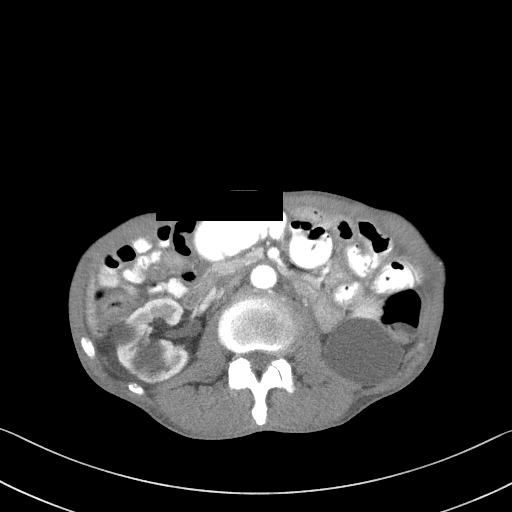
[im 74/135  mediastinal]
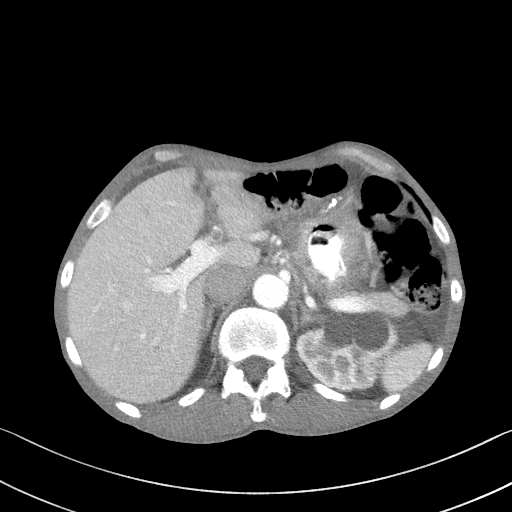
[im 86/135  mediastinal]
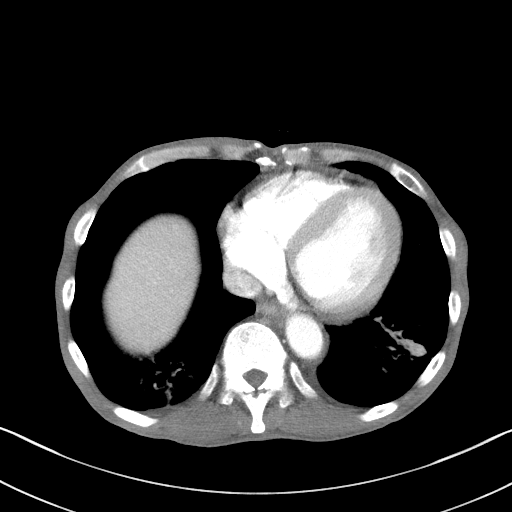
[im 98/135  mediastinal]
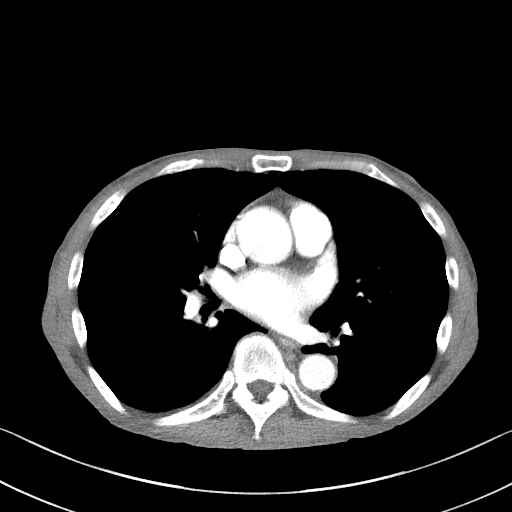
[im 110/135  mediastinal]
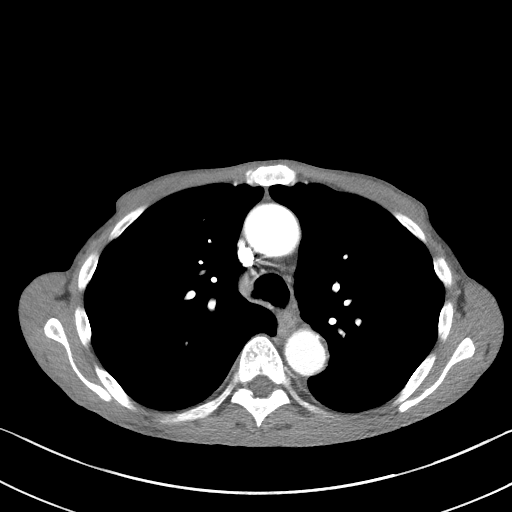
[im 110/135  bone]
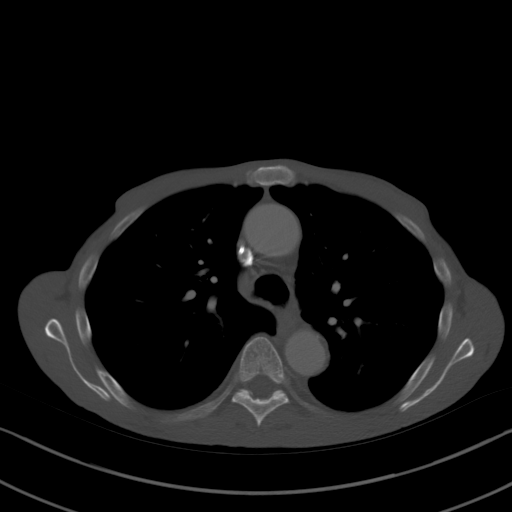
[im 122/135  mediastinal]
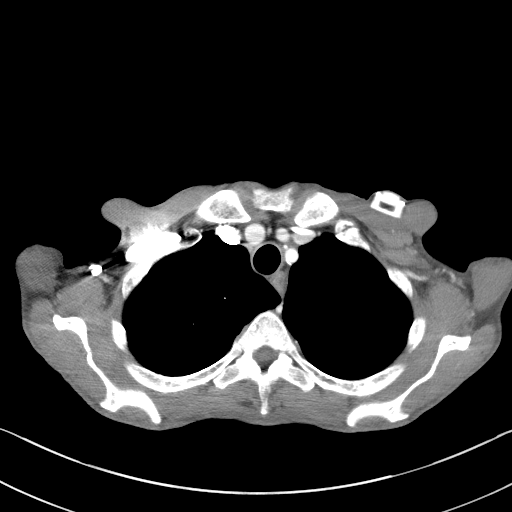

[Series 5: coronals · coronal · 0.96mm/px · 3 of 114 slices shown]
[im 23/114  mediastinal]
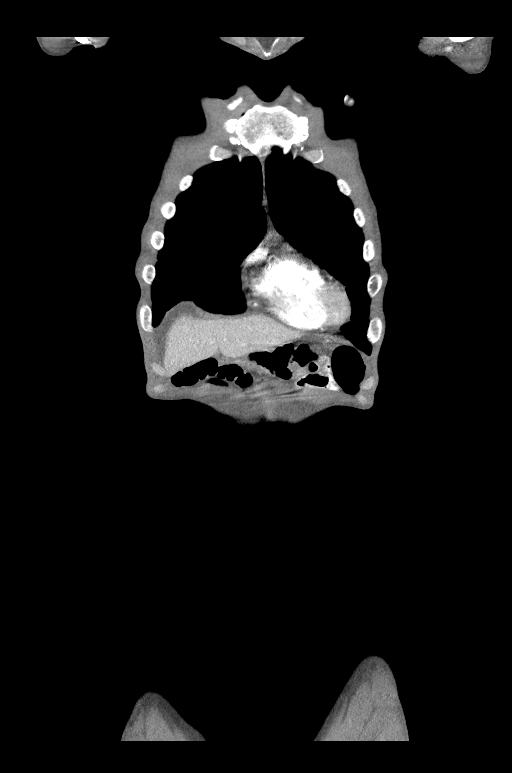
[im 46/114  mediastinal]
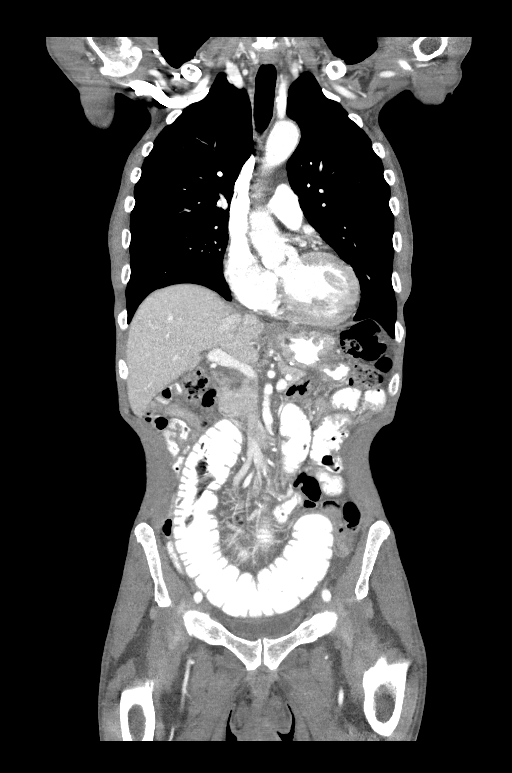
[im 68/114  mediastinal]
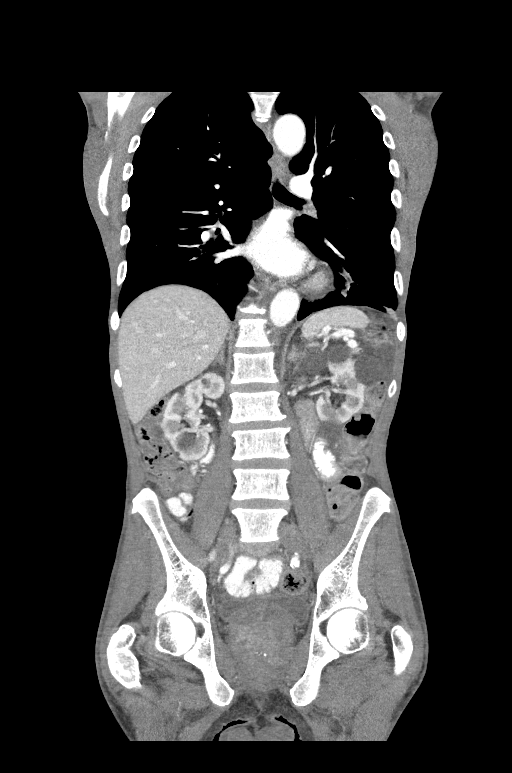

[13 of 36 positions shown; findings below may reference images not displayed]

FINDINGS: CT CHEST FINDINGS

Cardiovascular: Left Port-A-Cath tip: SVC. Aortic and branch vessel
atherosclerotic vascular calcifications. Ascending thoracic aortic
aneurysm 4.2 cm in diameter, stable.

Mediastinum/Nodes: Unremarkable

Lungs/Pleura: Bilateral airway thickening with airway plugging in
both lower lobes and resulting airspace opacities due to atelectasis
and possibly pneumonia in the lower lobes.

Musculoskeletal: A few small sclerotic right rib lesions are
unchanged and not previously hypermetabolic, and considered benign.

CT ABDOMEN PELVIS FINDINGS

Hepatobiliary: 1.0 cm hypodense lesion in segment 4 of the liver
corresponding to hypermetabolic lesion shown on prior PET-CT, but
subjectively reduced in size compared to prior PET-CT. Stable 0.8 by
0.5 cm hypodense lesion in the lateral segment left hepatic lobe on
image 57/2, no change from 04/04/2019. Borderline wall thickening in
the gallbladder.

Pancreas: Unremarkable

Spleen: Unremarkable

Adrenals/Urinary Tract: Both adrenal glands appear normal. Bilateral
renal cysts are present. Nondistended urinary bladder.

Stomach/Bowel: Partial gastrectomy. There is previously very high
activity in the gastric wall. There are some regions of
indistinctness and thickening of the gastric wall and residual tumor
is not excluded for example on images 60-62 of series 2.

There are dilated loops of small bowel anteriorly measuring up to
4.2 cm in diameter, without a well-defined lead point for
obstruction.

Cannot exclude wall thickening in the nondistended ascending colon
and hepatic flexure.

Vascular/Lymphatic: Aortoiliac atherosclerotic vascular disease. 1
no well-defined adenopathy.

Reproductive: Marked prostatomegaly.

Other: Diffuse low-grade subcutaneous and mesenteric edema. Trace
ascites in the pelvis.

Musculoskeletal: Unremarkable
IMPRESSION: 1. The small hypodense lesion in segment 4 of the liver was
previously hypermetabolic, and is subjectively reduced in size
compared to prior PET-CT.
2. Dilated loops of small bowel anteriorly without a well-defined
lead point for obstruction. This could be from ileus or low-grade
partial small bowel obstruction.
3. Continued gastric wall thickening is not entirely specific but
residual gastric tumor is not excluded. Postoperative findings along
the residual stomach.
4. Cannot exclude wall thickening in the nondistended ascending
colon and hepatic flexure.
5. Diffuse low-grade subcutaneous and mesenteric edema. Trace
ascites in the pelvis. Appearance favors third spacing of fluid.
6. Bilateral airway thickening with airway plugging in both lower
lobes and resulting airspace opacities due to atelectasis and
possibly pneumonia in the lower lobes.
7. Marked prostatomegaly.
8. Stable 4.2 cm ascending thoracic aortic aneurysm.
9. Aortic atherosclerosis.

Aortic Atherosclerosis ([id number].O).
